# Patient Record
Sex: Male | Born: 1955 | Race: Black or African American | Hispanic: No | Marital: Single | Smoking: Never smoker
Health system: Southern US, Community
[De-identification: ages and names within clinical notes are randomized; demographics above are authoritative.]

## PROBLEM LIST (undated history)

## (undated) DIAGNOSIS — I10 Essential (primary) hypertension: Secondary | ICD-10-CM

## (undated) DIAGNOSIS — I272 Pulmonary hypertension, unspecified: Secondary | ICD-10-CM

## (undated) DIAGNOSIS — J40 Bronchitis, not specified as acute or chronic: Secondary | ICD-10-CM

## (undated) DIAGNOSIS — Z95811 Presence of heart assist device: Secondary | ICD-10-CM

## (undated) DIAGNOSIS — Z95810 Presence of automatic (implantable) cardiac defibrillator: Secondary | ICD-10-CM

## (undated) DIAGNOSIS — Z860101 Personal history of adenomatous and serrated colon polyps: Secondary | ICD-10-CM

## (undated) DIAGNOSIS — I509 Heart failure, unspecified: Secondary | ICD-10-CM

## (undated) DIAGNOSIS — J189 Pneumonia, unspecified organism: Secondary | ICD-10-CM

## (undated) HISTORY — DX: Pulmonary hypertension, unspecified: I27.20

## (undated) HISTORY — DX: Personal history of adenomatous and serrated colon polyps: Z86.0101

---

## 1998-04-05 DIAGNOSIS — I10 Essential (primary) hypertension: Secondary | ICD-10-CM

## 1998-04-05 HISTORY — DX: Essential (primary) hypertension: I10

## 1998-07-06 ENCOUNTER — Emergency Department (HOSPITAL_COMMUNITY): Admission: EM | Admit: 1998-07-06 | Discharge: 1998-07-06 | Payer: Self-pay | Admitting: Emergency Medicine

## 2005-03-16 ENCOUNTER — Emergency Department (HOSPITAL_COMMUNITY): Admission: EM | Admit: 2005-03-16 | Discharge: 2005-03-16 | Payer: Self-pay

## 2006-02-22 ENCOUNTER — Emergency Department (HOSPITAL_COMMUNITY): Admission: EM | Admit: 2006-02-22 | Discharge: 2006-02-22 | Payer: Self-pay | Admitting: Emergency Medicine

## 2008-03-10 ENCOUNTER — Emergency Department (HOSPITAL_COMMUNITY): Admission: EM | Admit: 2008-03-10 | Discharge: 2008-03-10 | Payer: Self-pay | Admitting: Emergency Medicine

## 2009-04-05 DIAGNOSIS — I509 Heart failure, unspecified: Secondary | ICD-10-CM

## 2009-04-05 DIAGNOSIS — Z95810 Presence of automatic (implantable) cardiac defibrillator: Secondary | ICD-10-CM

## 2009-04-05 HISTORY — DX: Heart failure, unspecified: I50.9

## 2009-04-05 HISTORY — DX: Presence of automatic (implantable) cardiac defibrillator: Z95.810

## 2009-07-14 ENCOUNTER — Ambulatory Visit (HOSPITAL_COMMUNITY): Admission: RE | Admit: 2009-07-14 | Discharge: 2009-07-14 | Payer: Self-pay | Admitting: Family Medicine

## 2009-07-16 ENCOUNTER — Encounter (INDEPENDENT_AMBULATORY_CARE_PROVIDER_SITE_OTHER): Payer: Self-pay | Admitting: Emergency Medicine

## 2009-07-16 ENCOUNTER — Ambulatory Visit: Payer: Self-pay | Admitting: Surgery

## 2009-07-16 ENCOUNTER — Inpatient Hospital Stay (HOSPITAL_COMMUNITY): Admission: EM | Admit: 2009-07-16 | Discharge: 2009-07-22 | Payer: Self-pay | Admitting: Emergency Medicine

## 2009-07-16 ENCOUNTER — Ambulatory Visit: Payer: Self-pay | Admitting: Internal Medicine

## 2009-07-18 ENCOUNTER — Encounter (INDEPENDENT_AMBULATORY_CARE_PROVIDER_SITE_OTHER): Payer: Self-pay | Admitting: Internal Medicine

## 2009-07-29 ENCOUNTER — Encounter (INDEPENDENT_AMBULATORY_CARE_PROVIDER_SITE_OTHER): Payer: Self-pay | Admitting: *Deleted

## 2009-08-07 ENCOUNTER — Ambulatory Visit: Payer: Self-pay | Admitting: Physician Assistant

## 2009-08-07 DIAGNOSIS — I472 Ventricular tachycardia, unspecified: Secondary | ICD-10-CM | POA: Insufficient documentation

## 2009-08-07 DIAGNOSIS — I10 Essential (primary) hypertension: Secondary | ICD-10-CM

## 2009-08-07 DIAGNOSIS — I5022 Chronic systolic (congestive) heart failure: Secondary | ICD-10-CM | POA: Insufficient documentation

## 2009-08-07 DIAGNOSIS — Z95810 Presence of automatic (implantable) cardiac defibrillator: Secondary | ICD-10-CM | POA: Insufficient documentation

## 2009-08-07 DIAGNOSIS — I428 Other cardiomyopathies: Secondary | ICD-10-CM

## 2009-08-07 DIAGNOSIS — K649 Unspecified hemorrhoids: Secondary | ICD-10-CM | POA: Insufficient documentation

## 2009-08-07 LAB — CONVERTED CEMR LAB: Rapid HIV Screen: NEGATIVE

## 2009-08-08 ENCOUNTER — Encounter: Payer: Self-pay | Admitting: Physician Assistant

## 2009-08-08 LAB — CONVERTED CEMR LAB
BUN: 15 mg/dL (ref 6–23)
CO2: 25 meq/L (ref 19–32)
Calcium: 9.3 mg/dL (ref 8.4–10.5)
Glucose, Bld: 95 mg/dL (ref 70–99)

## 2009-08-22 ENCOUNTER — Telehealth: Payer: Self-pay | Admitting: Physician Assistant

## 2009-09-15 ENCOUNTER — Telehealth: Payer: Self-pay | Admitting: Physician Assistant

## 2009-11-05 ENCOUNTER — Encounter: Payer: Self-pay | Admitting: Physician Assistant

## 2009-12-05 ENCOUNTER — Encounter: Payer: Self-pay | Admitting: Physician Assistant

## 2010-05-05 NOTE — Letter (Signed)
Summary: Handout Printed  Printed Handout:  - Sitz Bath 

## 2010-05-05 NOTE — Assessment & Plan Note (Signed)
Summary: XFU-- LOW BP, FACIAL FRACTURE//YC   Vital Signs:  Patient profile:   55 year old male Height:      70 inches Weight:      224 pounds BMI:     32.26 Temp:     97.4 degrees F oral Pulse rate:   78 / minute Pulse rhythm:   regular Resp:     18 per minute BP sitting:   107 / 78  (left arm) Cuff size:   regular  Vitals Entered By: Armenia Shannon (Aug 07, 2009 10:08 AM)  Primary Care Provider:  Tereso Newcomer, PA-C   History of Present Illness: New patient.  No prior PCP.    Just d/c from [place].  Says he was fine until he got sick in Feb.  Developed pnuemonia and was started on treatment.  He was admitted to [facility] with syncope on 07/16/2009.  He was noted to have suffered a left facial fracture secondary to his fall.  EF was 20-25% by echo.  He had a cath and was noted to have normal cors.  He was diagnosed with NICM.  He was noted to have NSVT on the monitors.  He had an AICD placed due to concerns over ventricular arrhythmias as a cause for his syncope.  He was in acute systolic heart failure.  He was diuresed and also treated for pneumonia.  ENT evaluated him and according to the d/c notes, he could f/u with ENT as needed.  He has seen the cardiologist since discharge.  He has another appointment in several weeks.   Denies any syncope or AICD therapies.  Denies dyspnea.  Describes NYHA Class 2 symptoms.  No orthopnea or PND.  No pedal edema.  No chest pain.  Hemorrhoids:  Has had a problem with his hemorrhoids since about March.  He has a lot of pain.  He has seen some blood on the stool.  No blood mixed in.  No melena.  He saw Dr. Lovell Sheehan in [place] and he did plan on getting doing hemorrhoid surgery tomorrow.  However, with his recent AICD, the surgery was canceled.  He has been taking stool softeners.  His stools are still somewhat hard.  Habits & Providers  Alcohol-Tobacco-Diet     Tobacco Status: never  Exercise-Depression-Behavior     Drug Use: no  Current Medications  (verified): 1)  Hydrocodone-Acetaminophen 5-500 Mg Tabs (Hydrocodone-Acetaminophen) .... Take One Tab By Mouth Twice Daily 2)  Lanoxin 0.125 Mg Tabs (Digoxin) .... Take One Tab Each Day 3)  Lisinopril 5 Mg Tabs (Lisinopril) .... One Tab Daliy 4)  Furosemide 40 Mg Tabs (Furosemide) .... One-Half Tab Every Day 5)  Metoprolol Tartrate 50 Mg Tabs (Metoprolol Tartrate) .... Take One Tab Every 12 Hours  Allergies (verified): No Known Drug Allergies  Past History:  Past Medical History: NICM EF 20% Nonsustained VTach with h/o syncope Chronic Systolic CHF Echo 07/18/2009:  EF 20-25%, diffuse HK, restrictive physiology c/w decreased LV diastolic compliance or increased LA pressure, mod. MR, trivial AI Carotids 07/16/2009:  no ICA stenosis Hypertension Hemorrhoids  Past Surgical History: Cardiac Cath 07/22/2009: normal coronaries s/p dual chamber ICD 07/22/2009  Family History: HTN - ?  Social History: Occupation: delivering pharmaceuticals Single 2 kids Never Smoked Alcohol use-no Drug use-no Occupation:  employed Smoking Status:  never Drug Use:  no  Physical Exam  General:  alert, well-developed, and well-nourished.   Head:  normocephalic and atraumatic.   Neck:  no jvd  Lungs:  normal  breath sounds, no crackles, and no wheezes.   Heart:  normal rate, regular rhythm, and no gallop.   Abdomen:  soft, non-tender, and no hepatomegaly.   Rectal:  several small hemorrhoids non-thrombosed  Extremities:  no edema  Neurologic:  alert & oriented X3 and cranial nerves II-XII intact.   Psych:  normally interactive.     Impression & Recommendations:  Problem # 1:  HEMORRHOIDS (ICD-455.6) cannot have surgery now due to recent AICD hemorrhoids are small advised him to avoid hydrocodone for pain mgmt . . . Marland Kitchentylenol or ibuprofen as needed refill anusol advised him to use sitz baths high fiber diet handout given Rx for miralax  Problem # 2:  CHRONIC SYSTOLIC HEART FAILURE  (ICD-428.22) optivolemic f/u with [facility].  His updated medication list for this problem includes:    Lanoxin 0.125 Mg Tabs (Digoxin) .Marland Kitchen... Take one tab each day    Lisinopril 5 Mg Tabs (Lisinopril) ..... One tab daliy    Furosemide 40 Mg Tabs (Furosemide) ..... One-half tab every day    Metoprolol Tartrate 50 Mg Tabs (Metoprolol tartrate) .Marland Kitchen... Take one tab every 12 hours  Problem # 3:  UNSPECIFIED SECONDARY CARDIOMYOPATHY (ICD-425.9)  not sure why he is on metoprolol instead of carvedilol defer to cardiology  Orders: T-HIV Antibody  (Reflex) ([id number])  Problem # 4:  ESSENTIAL HYPERTENSION, BENIGN (ICD-401.1)  controlled  His updated medication list for this problem includes:    Lisinopril 5 Mg Tabs (Lisinopril) ..... One tab daliy    Furosemide 40 Mg Tabs (Furosemide) ..... One-half tab every day    Metoprolol Tartrate 50 Mg Tabs (Metoprolol tartrate) .Marland Kitchen... Take one tab every 12 hours  Orders: T-Basic Metabolic Panel [phone])  Problem # 5:  Preventive Health Care (ICD-V70.0) eventually schedule CPE will have to hold off on colo until ok with cardiology  Complete Medication List: 1)  Hydrocodone-acetaminophen 5-500 Mg Tabs (Hydrocodone-acetaminophen) .... Take one tab by mouth twice daily 2)  Lanoxin 0.125 Mg Tabs (Digoxin) .... Take one tab each day 3)  Lisinopril 5 Mg Tabs (Lisinopril) .... One tab daliy 4)  Furosemide 40 Mg Tabs (Furosemide) .... One-half tab every day 5)  Metoprolol Tartrate 50 Mg Tabs (Metoprolol tartrate) .... Take one tab every 12 hours 6)  Anusol-hc 25 Mg Supp (Hydrocortisone acetate) .... Apply two times a day as needed for hemorrhoidal pain 7)  Miralax Powd (Polyethylene glycol 3350) .... Dissolve one capful in 8 oz glass of water and drink once daily  Patient Instructions: 1)  Do Sitz baths once or twice a day as needed. 2)  Increase Fiber in diet. 3)  Try Miralax every other day at first.  You can use once daily if  needed. 4)  Stop the stool softeners. 5)  Try tylenol or ibuprofen first for pain from hemorrhoids. 6)  Take 650 - 1000 mg of tylenol every 4-6 hours as needed for relief of pain or comfort of fever. Avoid taking more than 4000 mg in a 24 hour period( can cause liver damage in higher doses).  7)  Take 400-600 mg of Ibuprofen (Advil, Motrin) with food every 4-6 hours as needed  for relief of pain or comfort of fever.  8)  Do not take medicines like ibuprofen on a daily basis. 9)  Please schedule a follow-up appointment in 1 month with Fleeta Kunde for blood pressure and hemorrhoids.  Prescriptions: MIRALAX  POWD (POLYETHYLENE GLYCOL 3350) dissolve one capful in 8 oz glass of water and drink once  daily  #1 bottle x 5   Entered and Authorized by:   Tereso Newcomer PA-C   Signed by:   Tereso Newcomer PA-C on 08/07/2009   Method used:   Print then Give to Patient   RxID:   [id number] ANUSOL-HC 25 MG SUPP (HYDROCORTISONE ACETATE) apply two times a day as needed for hemorrhoidal pain  #20 x 3   Entered and Authorized by:   Tereso Newcomer PA-C   Signed by:   Tereso Newcomer PA-C on 08/07/2009   Method used:   Print then Give to Patient   RxID:   [id number]   Laboratory Results  Date/Time Received: Aug 07, 2009 11:29 AM   Other Tests  Rapid HIV: negative

## 2010-05-05 NOTE — Progress Notes (Signed)
Summary: Refills  Phone Note Call from Patient Call back at [facility] Phone [phone] Call back at [phone]   Summary of Call: This pt had a first hospital follow up on Aug 07, 2009 and he had in april two prescription that was prescribed from a physician at the ER and he would like to request his current provider prescribe  those medications to be refills (lanoxin, and metroprolol).  [facility] [phone]) Meira Wahba PA-c Initial call taken by: Manon Hilding,  Aug 22, 2009 11:53 AM  Follow-up for Phone Call        forward to provider Follow-up by: Arm[place] Shannon,  Aug 22, 2009 12:21 PM  Additional Follow-up for Phone Call Additional follow up Details #1::        That is ok. But, these meds are managed by the cardiologist. Make sure he is following up with them. Please request records be sent from their office (he goes to [facility]).  Additional Follow-up by: Tereso Newcomer PA-C,  Aug 22, 2009 1:57 PM    Additional Follow-up for Phone Call Additional follow up Details #2::    Left message on answering machine for pt to call back.Marland KitchenMarland KitchenMarland KitchenArmenia Shannon  Aug 27, 2009 11:16 AM   PT IS   via Aggie Cosier pt is awre Follow-up by: Armenia Shannon,  Aug 29, 2009 3:37 PM  Prescriptions: METOPROLOL TARTRATE 50 MG TABS (METOPROLOL TARTRATE) take one tab every 12 hours  #60 x 3   Entered and Authorized by:   Tereso Newcomer PA-C   Signed by:   Tereso Newcomer PA-C on 08/22/2009   Method used:   Electronically to        [facility] (retail)       [address]       [place], [place]  [address]       Ph: [phone]       Fax: [phone]   RxID:   [id number] LANOXIN 0.125 MG TABS (DIGOXIN) take one tab each day  #30 x 3   Entered and Authorized by:   Tereso Newcomer PA-C   Signed by:   Tereso Newcomer PA-C on 08/22/2009   Method used:   Electronically to        [facility] (retail)       [address]  [place], [place]  [address]       Ph: [phone]       Fax: [phone]   RxID:   [id number]

## 2010-05-05 NOTE — Miscellaneous (Signed)
  Clinical Lists Changes  Medications: Changed medication from METOPROLOL TARTRATE 50 MG TABS (METOPROLOL TARTRATE) take one tab every 12 hours to METOPROLOL TARTRATE 50 MG TABS (METOPROLOL TARTRATE) 1/2 tab in AM and 1 by mouth in PM  Appended Document:     Clinical Lists Changes  Observations: Added new observation of PAST MED HX: NICM EF 20% Nonsustained VTach with h/o syncope Chronic Systolic CHF   a.  CARD:  DR. Croitoru at [facility] Echo 07/18/2009:  EF 20-25%, diffuse HK, restrictive physiology c/w decreased LV diastolic compliance or increased LA pressure, mod. MR, trivial AI Carotids 07/16/2009:  no ICA stenosis Hypertension Hemorrhoids  (12/05/2009 16:20)        Past History:  Past Medical History: NICM EF 20% Nonsustained VTach with h/o syncope Chronic Systolic CHF   a.  CARD:  DR. Croitoru at [facility] Echo 07/18/2009:  EF 20-25%, diffuse HK, restrictive physiology c/w decreased LV diastolic compliance or increased LA pressure, mod. MR, trivial AI Carotids 07/16/2009:  no ICA stenosis Hypertension Hemorrhoids

## 2010-05-05 NOTE — Letter (Signed)
Summary: [facility]   Imported By: Arta Bruce 12/11/2009 12:51:58  _____________________________________________________________________  External Attachment:    Type:   Image     Comment:   External Document

## 2010-05-05 NOTE — Letter (Signed)
Summary: New Patient letter  [facility]  [address] [place], [place] [address]   Phone: [phone]  Fax: [phone]       07/29/2009 MRN: [id number]  Robert [name] [address] [place], [place]  [address]  Dear Robert [name],  Welcome to the Gastroenterology Division at [facility].    You are scheduled to see Dr. Russella Dar on 09/08/2009 at 3:30PM on the 3rd floor at [facility], [address]  We ask that you try to arrive at our office 15 minutes prior to your appointment time to allow for check-in.  We would like you to complete the enclosed self-administered evaluation form prior to your visit and bring it with you on the day of your appointment.  We will review it with you.  Also, please bring a complete list of all your medications or, if you prefer, bring the medication bottles and we will list them.  Please bring your insurance card so that we may make a copy of it.  If your insurance requires a referral to see a specialist, please bring your referral form from your primary care physician.  Co-payments are due at the time of your visit and may be paid by cash, check or credit card.     Your office visit will consist of a consult with your physician (includes a physical exam), any laboratory testing he/she may order, scheduling of any necessary diagnostic testing (e.g. x-ray, ultrasound, CT-scan), and scheduling of a procedure (e.g. Endoscopy, Colonoscopy) if required.  Please allow enough time on your schedule to allow for any/all of these possibilities.    If you cannot keep your appointment, please call [phone] to cancel or reschedule prior to your appointment date.  This allows [place] the opportunity to schedule an appointment for another patient in need of care.  If you do not cancel or reschedule by 5 p.m. the business day prior to your appointment date, you will be charged a $50.00 late cancellation/no-show fee.    Thank you for choosing [facility] for your medical needs.  We appreciate the opportunity to care for you.  Please visit [place] at our website  to learn more about our practice.                     Sincerely,                                                             The Gastroenterology Division

## 2010-05-05 NOTE — Letter (Signed)
Summary: Handout Printed  Printed Handout:  - Diet - High-Fiber 

## 2010-05-05 NOTE — Progress Notes (Signed)
Summary: Request medication  Phone Note Call from Patient Call back at Home Phone [phone] Call back at [phone]   Summary of Call: Since Friday evening, this pt starting have chest congestion, running nose, coughing and probably sinus infection.   Pt was taking some kind of allergy medication that cannot recalled the name but seem that is not helping him.  Pt wants to make sure which medication should she get out in the counter.  [facility] [address] [phone]) Alben Spittle PA-c Initial call taken by: Manon Hilding,  September 15, 2009 2:43 PM  Follow-up for Phone Call        Left message on answer machine for pt. to return call Gaylyn Cheers RN  September 15, 2009 2:49 PM   Additional Follow-up for Phone Call Additional follow up Details #1::        Pt. stopped by office, having cl nasal drainage and post nasal drip. Denies fever. Advised to take OTC Zyrtec and may use saline nasal spray. Keep windows closed, avoid being outdoors for long periods of time, shower and change clothes after being outdooors. To contact our office if develops fever, drainage becomes green, feels worse or no improvement. Pt. verbalized understanding. Additional Follow-up by: Gaylyn Cheers RN,  September 15, 2009 4:02 PM

## 2010-05-05 NOTE — Letter (Signed)
Summary: pt information sheet  pt information sheet   Imported By: Arta Bruce 10/02/2009 15:11:22  _____________________________________________________________________  External Attachment:    Type:   Image     Comment:   External Document

## 2010-05-05 NOTE — Letter (Signed)
Summary: *HSN Results Follow up  [facility]  [address] [place], [place] [address]   Phone: [phone]  Fax: [phone]      08/08/2009   JENKINS [name] [address] [place], [place]  [address]   Dear  [name],                            ____S.Drinkard,FNP   ____D. Gore,FNP       ____B. McPherson,MD   ____V. Rankins,MD    ____E. Mulberry,MD    ____N. Daphine Deutscher, FNP  ____D. Reche Dixon, MD    ____K. Philipp Deputy, MD    __x__S. Alben Spittle, PA-C     This letter is to inform you that your recent test(s):  _______Pap Smear    ____x___Lab Test     _______X-ray    ___x____ is within acceptable limits  _______ requires a medication change  _______ requires a follow-up lab visit  _______ requires a follow-up visit with your provider   Comments:       _________________________________________________________ If you have any questions, please contact our office                     Sincerely,  Tereso Newcomer PA-C [facility]

## 2010-06-23 LAB — CBC
Hemoglobin: 13.4 g/dL (ref 13.0–17.0)
Hemoglobin: 14.2 g/dL (ref 13.0–17.0)
Hemoglobin: 14.5 g/dL (ref 13.0–17.0)
MCHC: 34.8 g/dL (ref 30.0–36.0)
MCHC: 35 g/dL (ref 30.0–36.0)
MCV: 89.5 fL (ref 78.0–100.0)
MCV: 89.7 fL (ref 78.0–100.0)
MCV: 90.2 fL (ref 78.0–100.0)
Platelets: 137 10*3/uL — ABNORMAL LOW (ref 150–400)
RBC: 4.54 MIL/uL (ref 4.22–5.81)
RBC: 4.65 MIL/uL (ref 4.22–5.81)
RBC: 4.68 MIL/uL (ref 4.22–5.81)
RDW: 13.9 % (ref 11.5–15.5)
RDW: 14.1 % (ref 11.5–15.5)
RDW: 14.3 % (ref 11.5–15.5)
WBC: 7.8 10*3/uL (ref 4.0–10.5)
WBC: 9 10*3/uL (ref 4.0–10.5)
WBC: 9.3 10*3/uL (ref 4.0–10.5)

## 2010-06-23 LAB — BASIC METABOLIC PANEL
BUN: 13 mg/dL (ref 6–23)
BUN: 16 mg/dL (ref 6–23)
CO2: 24 mEq/L (ref 19–32)
CO2: 28 mEq/L (ref 19–32)
CO2: 29 mEq/L (ref 19–32)
Calcium: 8.4 mg/dL (ref 8.4–10.5)
Calcium: 8.6 mg/dL (ref 8.4–10.5)
Chloride: 103 mEq/L (ref 96–112)
Chloride: 108 mEq/L (ref 96–112)
Creatinine, Ser: 1.29 mg/dL (ref 0.4–1.5)
Creatinine, Ser: 1.51 mg/dL — ABNORMAL HIGH (ref 0.4–1.5)
GFR calc Af Amer: 59 mL/min — ABNORMAL LOW (ref >60)
GFR calc Af Amer: >60 mL/min (ref >60)
GFR calc non Af Amer: 49 mL/min — ABNORMAL LOW (ref >60)
GFR calc non Af Amer: 58 mL/min — ABNORMAL LOW (ref >60)
Glucose, Bld: 106 mg/dL — ABNORMAL HIGH (ref 70–99)
Glucose, Bld: 90 mg/dL (ref 70–99)
Glucose, Bld: 96 mg/dL (ref 70–99)
Potassium: 3.5 mEq/L (ref 3.5–5.1)
Sodium: 138 mEq/L (ref 135–145)
Sodium: 140 mEq/L (ref 135–145)

## 2010-06-23 LAB — LIPID PANEL
Triglycerides: 110 mg/dL (ref <150)
VLDL: 22 mg/dL (ref 0–40)

## 2010-06-23 LAB — POCT I-STAT 3, VENOUS BLOOD GAS (G3P V)
Bicarbonate: 26 mEq/L — ABNORMAL HIGH (ref 20.0–24.0)
pH, Ven: 7.406 — ABNORMAL HIGH (ref 7.250–7.300)
pO2, Ven: 37 mmHg (ref 30.0–45.0)

## 2010-06-23 LAB — BRAIN NATRIURETIC PEPTIDE
Pro B Natriuretic peptide (BNP): 275 pg/mL — ABNORMAL HIGH (ref 0.0–100.0)
Pro B Natriuretic peptide (BNP): 285 pg/mL — ABNORMAL HIGH (ref 0.0–100.0)
Pro B Natriuretic peptide (BNP): 525 pg/mL — ABNORMAL HIGH (ref 0.0–100.0)

## 2010-06-23 LAB — DIFFERENTIAL
Basophils Absolute: 0 10*3/uL (ref 0.0–0.1)
Basophils Relative: 0 % (ref 0–1)
Basophils Relative: 0 % (ref 0–1)
Eosinophils Absolute: 0 10*3/uL (ref 0.0–0.7)
Eosinophils Absolute: 0.1 10*3/uL (ref 0.0–0.7)
Eosinophils Relative: 1 % (ref 0–5)
Lymphs Abs: 1.6 10*3/uL (ref 0.7–4.0)
Monocytes Absolute: 0.7 10*3/uL (ref 0.1–1.0)
Monocytes Relative: 9 % (ref 3–12)
Neutro Abs: 6.9 10*3/uL (ref 1.7–7.7)

## 2010-06-23 LAB — MAGNESIUM
Magnesium: 2.3 mg/dL (ref 1.5–2.5)
Magnesium: 2.5 mg/dL (ref 1.5–2.5)

## 2010-06-23 LAB — POCT I-STAT 3, ART BLOOD GAS (G3+)
O2 Saturation: 93 %
TCO2: 25 mmol/L (ref 0–100)
pCO2 arterial: 36.8 mmHg (ref 35.0–45.0)
pO2, Arterial: 65 mmHg — ABNORMAL LOW (ref 80.0–100.0)

## 2010-06-24 LAB — BASIC METABOLIC PANEL
CO2: 25 mEq/L (ref 19–32)
Chloride: 104 mEq/L (ref 96–112)
Chloride: 105 mEq/L (ref 96–112)
GFR calc Af Amer: >60 mL/min (ref >60)
GFR calc non Af Amer: 60 mL/min — ABNORMAL LOW (ref >60)
GFR calc non Af Amer: >60 mL/min (ref >60)
Glucose, Bld: 99 mg/dL (ref 70–99)
Potassium: 3.7 mEq/L (ref 3.5–5.1)
Potassium: 3.7 mEq/L (ref 3.5–5.1)
Sodium: 135 mEq/L (ref 135–145)
Sodium: 137 mEq/L (ref 135–145)

## 2010-06-24 LAB — DIFFERENTIAL
Eosinophils Absolute: 0 10*3/uL (ref 0.0–0.7)
Eosinophils Relative: 0 % (ref 0–5)
Lymphocytes Relative: 5 % — ABNORMAL LOW (ref 12–46)
Lymphs Abs: 0.7 10*3/uL (ref 0.7–4.0)
Monocytes Relative: 4 % (ref 3–12)
Neutrophils Relative %: 91 % — ABNORMAL HIGH (ref 43–77)

## 2010-06-24 LAB — POCT I-STAT, CHEM 8
Chloride: 106 mEq/L (ref 96–112)
HCT: 43 % (ref 39.0–52.0)
Hemoglobin: 14.6 g/dL (ref 13.0–17.0)
Potassium: 3.6 mEq/L (ref 3.5–5.1)
Sodium: 137 mEq/L (ref 135–145)

## 2010-06-24 LAB — CARDIAC PANEL(CRET KIN+CKTOT+MB+TROPI)
CK, MB: 3 ng/mL (ref 0.3–4.0)
Relative Index: 2 (ref 0.0–2.5)
Relative Index: 2.1 (ref 0.0–2.5)
Total CK: 148 U/L (ref 7–232)
Troponin I: 0.04 ng/mL (ref 0.00–0.06)

## 2010-06-24 LAB — PROTIME-INR
INR: 1.09 (ref 0.00–1.49)
Prothrombin Time: 14 seconds (ref 11.6–15.2)

## 2010-06-24 LAB — URINALYSIS, ROUTINE W REFLEX MICROSCOPIC
Ketones, ur: NEGATIVE mg/dL
Nitrite: NEGATIVE
Protein, ur: NEGATIVE mg/dL
Urobilinogen, UA: 0.2 mg/dL (ref 0.0–1.0)

## 2010-06-24 LAB — CK TOTAL AND CKMB (NOT AT ARMC)
CK, MB: 3.5 ng/mL (ref 0.3–4.0)
Relative Index: 2.3 (ref 0.0–2.5)

## 2010-06-24 LAB — URINE CULTURE: Culture: NO GROWTH

## 2010-06-24 LAB — RAPID URINE DRUG SCREEN, HOSP PERFORMED
Amphetamines: NOT DETECTED
Barbiturates: NOT DETECTED
Opiates: POSITIVE — AB

## 2010-06-24 LAB — CBC
HCT: 40.6 % (ref 39.0–52.0)
HCT: 41 % (ref 39.0–52.0)
Hemoglobin: 14.2 g/dL (ref 13.0–17.0)
MCV: 89.6 fL (ref 78.0–100.0)
MCV: 89.8 fL (ref 78.0–100.0)
Platelets: 125 10*3/uL — ABNORMAL LOW (ref 150–400)
RBC: 4.52 MIL/uL (ref 4.22–5.81)
RDW: 14.2 % (ref 11.5–15.5)
WBC: 12.5 10*3/uL — ABNORMAL HIGH (ref 4.0–10.5)

## 2010-06-24 LAB — POCT CARDIAC MARKERS
Myoglobin, poc: 107 ng/mL (ref 12–200)
Troponin i, poc: <0.05 ng/mL (ref 0.00–0.09)

## 2010-06-24 LAB — APTT: aPTT: 25 seconds (ref 24–37)

## 2010-06-24 LAB — BRAIN NATRIURETIC PEPTIDE: Pro B Natriuretic peptide (BNP): 736 pg/mL — ABNORMAL HIGH (ref 0.0–100.0)

## 2011-06-03 HISTORY — PX: CARDIAC DEFIBRILLATOR PLACEMENT: SHX171

## 2011-08-04 ENCOUNTER — Encounter (HOSPITAL_COMMUNITY): Payer: Self-pay | Admitting: Emergency Medicine

## 2011-08-04 ENCOUNTER — Inpatient Hospital Stay (HOSPITAL_COMMUNITY)
Admission: EM | Admit: 2011-08-04 | Discharge: 2011-08-06 | DRG: 069 | Disposition: A | Discharge status: Home / Self Care | Payer: PRIVATE HEALTH INSURANCE | Attending: Family Medicine | Admitting: Family Medicine

## 2011-08-04 DIAGNOSIS — G458 Other transient cerebral ischemic attacks and related syndromes: Principal | ICD-10-CM | POA: Diagnosis present

## 2011-08-04 DIAGNOSIS — I509 Heart failure, unspecified: Secondary | ICD-10-CM

## 2011-08-04 DIAGNOSIS — Z7982 Long term (current) use of aspirin: Secondary | ICD-10-CM

## 2011-08-04 DIAGNOSIS — I4729 Other ventricular tachycardia: Secondary | ICD-10-CM | POA: Diagnosis present

## 2011-08-04 DIAGNOSIS — K649 Unspecified hemorrhoids: Secondary | ICD-10-CM

## 2011-08-04 DIAGNOSIS — I428 Other cardiomyopathies: Secondary | ICD-10-CM

## 2011-08-04 DIAGNOSIS — I5023 Acute on chronic systolic (congestive) heart failure: Secondary | ICD-10-CM | POA: Diagnosis present

## 2011-08-04 DIAGNOSIS — G459 Transient cerebral ischemic attack, unspecified: Secondary | ICD-10-CM

## 2011-08-04 DIAGNOSIS — I5022 Chronic systolic (congestive) heart failure: Secondary | ICD-10-CM

## 2011-08-04 DIAGNOSIS — J40 Bronchitis, not specified as acute or chronic: Secondary | ICD-10-CM | POA: Insufficient documentation

## 2011-08-04 DIAGNOSIS — I472 Ventricular tachycardia, unspecified: Secondary | ICD-10-CM

## 2011-08-04 DIAGNOSIS — J189 Pneumonia, unspecified organism: Secondary | ICD-10-CM

## 2011-08-04 DIAGNOSIS — Z79899 Other long term (current) drug therapy: Secondary | ICD-10-CM

## 2011-08-04 DIAGNOSIS — I10 Essential (primary) hypertension: Secondary | ICD-10-CM

## 2011-08-04 DIAGNOSIS — Z95810 Presence of automatic (implantable) cardiac defibrillator: Secondary | ICD-10-CM

## 2011-08-04 HISTORY — DX: Presence of automatic (implantable) cardiac defibrillator: Z95.810

## 2011-08-04 HISTORY — DX: Bronchitis, not specified as acute or chronic: J40

## 2011-08-04 HISTORY — DX: Pneumonia, unspecified organism: J18.9

## 2011-08-04 HISTORY — DX: Heart failure, unspecified: I50.9

## 2011-08-04 HISTORY — DX: Essential (primary) hypertension: I10

## 2011-08-04 NOTE — ED Notes (Signed)
Patient reports that his defibrillator fired once yesterday -- patient states that right before it fired, he felt palpitations in his heart.  Patient denies chest pain and shortness of breath at this time.  Patient checked his blood pressure today; reading 105/82 with HR 92.  Patient states that he rechecked his vitals right before bed tonight and had a blood pressure of 128/ with a HR of 52.  Patient was concerned with the change in heart rate and blood pressure, so he came to the ED to get checked out.  Patient denies defibrillator firing today.

## 2011-08-05 ENCOUNTER — Emergency Department (HOSPITAL_COMMUNITY): Payer: PRIVATE HEALTH INSURANCE

## 2011-08-05 ENCOUNTER — Encounter (HOSPITAL_COMMUNITY): Payer: Self-pay | Admitting: Internal Medicine

## 2011-08-05 DIAGNOSIS — G459 Transient cerebral ischemic attack, unspecified: Secondary | ICD-10-CM

## 2011-08-05 DIAGNOSIS — I428 Other cardiomyopathies: Secondary | ICD-10-CM

## 2011-08-05 DIAGNOSIS — I472 Ventricular tachycardia: Secondary | ICD-10-CM

## 2011-08-05 LAB — CBC
Hemoglobin: 15.2 g/dL (ref 13.0–17.0)
MCH: 29.5 pg (ref 26.0–34.0)
MCV: 87.9 fL (ref 78.0–100.0)
Platelets: 145 10*3/uL — ABNORMAL LOW (ref 150–400)
Platelets: 161 10*3/uL (ref 150–400)
RBC: 4.99 MIL/uL (ref 4.22–5.81)
RBC: 5.2 MIL/uL (ref 4.22–5.81)
WBC: 10.3 10*3/uL (ref 4.0–10.5)
WBC: 7.5 10*3/uL (ref 4.0–10.5)

## 2011-08-05 LAB — COMPREHENSIVE METABOLIC PANEL
ALT: 55 U/L — ABNORMAL HIGH (ref 0–53)
Alkaline Phosphatase: 63 U/L (ref 39–117)
CO2: 24 mEq/L (ref 19–32)
GFR calc Af Amer: 54 mL/min — ABNORMAL LOW (ref >90)
GFR calc non Af Amer: 47 mL/min — ABNORMAL LOW (ref >90)
Glucose, Bld: 103 mg/dL — ABNORMAL HIGH (ref 70–99)
Potassium: 4.1 mEq/L (ref 3.5–5.1)
Sodium: 136 mEq/L (ref 135–145)
Total Bilirubin: 0.9 mg/dL (ref 0.3–1.2)

## 2011-08-05 LAB — DIFFERENTIAL
Eosinophils Relative: 1 % (ref 0–5)
Lymphocytes Relative: 26 % (ref 12–46)
Lymphs Abs: 2.6 10*3/uL (ref 0.7–4.0)
Neutrophils Relative %: 66 % (ref 43–77)

## 2011-08-05 LAB — PRO B NATRIURETIC PEPTIDE: Pro B Natriuretic peptide (BNP): 2992 pg/mL — ABNORMAL HIGH (ref 0–125)

## 2011-08-05 LAB — CREATININE, SERUM: Creatinine, Ser: 1.45 mg/dL — ABNORMAL HIGH (ref 0.50–1.35)

## 2011-08-05 LAB — PROTIME-INR: Prothrombin Time: 14.8 seconds (ref 11.6–15.2)

## 2011-08-05 MED ORDER — LISINOPRIL 10 MG PO TABS
10.0000 mg | ORAL_TABLET | Freq: Every day | ORAL | Status: DC
  Administered 2011-08-05 – 2011-08-06 (×2): 10 mg via ORAL
  Filled 2011-08-05 (×2): qty 1

## 2011-08-05 MED ORDER — METOPROLOL TARTRATE 25 MG PO TABS
25.0000 mg | ORAL_TABLET | Freq: Two times a day (BID) | ORAL | Status: DC
  Filled 2011-08-05 (×2): qty 1

## 2011-08-05 MED ORDER — ASPIRIN 325 MG PO TABS
325.0000 mg | ORAL_TABLET | Freq: Every day | ORAL | Status: DC
  Administered 2011-08-05 – 2011-08-06 (×2): 325 mg via ORAL
  Filled 2011-08-05 (×2): qty 1

## 2011-08-05 MED ORDER — SODIUM CHLORIDE 0.9 % IV SOLN: INTRAVENOUS | Status: AC

## 2011-08-05 MED ORDER — FUROSEMIDE 40 MG PO TABS
40.0000 mg | ORAL_TABLET | Freq: Every day | ORAL | Status: DC
  Administered 2011-08-05 – 2011-08-06 (×2): 40 mg via ORAL
  Filled 2011-08-05 (×2): qty 1

## 2011-08-05 MED ORDER — POTASSIUM CHLORIDE CRYS ER 20 MEQ PO TBCR
20.0000 meq | EXTENDED_RELEASE_TABLET | Freq: Every day | ORAL | Status: DC
  Administered 2011-08-05 – 2011-08-06 (×2): 20 meq via ORAL
  Filled 2011-08-05 (×2): qty 1

## 2011-08-05 MED ORDER — ENOXAPARIN SODIUM 40 MG/0.4ML ~~LOC~~ SOLN
40.0000 mg | SUBCUTANEOUS | Status: DC
  Administered 2011-08-05: 40 mg via SUBCUTANEOUS
  Filled 2011-08-05 (×2): qty 0.4

## 2011-08-05 MED ORDER — METOPROLOL SUCCINATE ER 50 MG PO TB24
50.0000 mg | ORAL_TABLET | Freq: Every day | ORAL | Status: DC
  Administered 2011-08-05 – 2011-08-06 (×2): 50 mg via ORAL
  Filled 2011-08-05 (×2): qty 1

## 2011-08-05 NOTE — ED Notes (Signed)
Re-paged Dr. Herbie Baltimore x3 to [phone]

## 2011-08-05 NOTE — Care Management Note (Signed)
    Page 1 of 1   08/06/2011     2:44:03 PM   CARE MANAGEMENT NOTE 08/06/2011  Patient:  Robert [name], Robert [name]   Account Number:  [id number]  Date Initiated:  08/05/2011  Documentation initiated by:  Darlyne Russian  Subjective/Objective Assessment:   Patient admitted with TIA     Action/Plan:   Progression of care and discharge planning   Anticipated DC Date:  08/09/2011   Anticipated DC Plan:  HOME/SELF CARE      DC Planning Services  CM consult      Choice offered to / List presented to:             Status of service:  In process, will continue to follow Medicare Important Message given?   (If response is "NO", the following Medicare IM given date fields will be blank) Date Medicare IM given:   Date Additional Medicare IM given:    Discharge Disposition:    Per UR Regulation:  Reviewed for med. necessity/level of care/duration of stay  If discussed at Long Length of Stay Meetings, dates discussed:    Comments:  08/06/11 Onnie Boer, RN, BSN 1438 PT HAS CARDIOMYOPATHY.  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  PT IS HIS FAMILIES ONLY PROVIDER.  PT DRIVES IN BOTH OF HIS JOBS. WILL TALK WITH FINANCIAL COORDINATORS AGAIN.  WILL F/U.  08/05/11 Onnie Boer, RN, BSN 1452 PT IS FROM HOME WITH SELF CARE.  PT STATED THAT HE WOULD LIKE TO GO HOME AND NEEDS ASSISTANCE WITH HIS HOSPITAL BILLS.  FINANCIAL COUNCILING WAS NOTIFIED OF THE PT'S CONCERNS.  08/05/2011 0900 Darlyne Russian RN  [phone] Utilization review comnpleted.

## 2011-08-05 NOTE — Progress Notes (Signed)
Utilization review completed. Deanna Boehlke RN, CCM  

## 2011-08-05 NOTE — Progress Notes (Signed)
Pt. Had 4 runs v-tach that lasted for about 3-4 seconds. Pt under no s/s distress. Strips placed in chart.

## 2011-08-05 NOTE — Evaluation (Signed)
Occupational Therapy Evaluation Patient Details Name: Robert [name] MRN: [id number] DOB: March 12, 1956 Today's Date: 08/05/2011 Time: 3086-5784 OT Time Calculation (min): 14 min  OT Assessment / Plan / Recommendation Clinical Impression  This 56 y.o. male admitted with Rt. LE weakness and ACID firing.  Pt. appears back to baseline with no OT deficits noted.  Will sign off.    OT Assessment  Patient does not need any further OT services    Follow Up Recommendations  No OT follow up    Equipment Recommendations  None recommended by OT    Frequency      Precautions / Restrictions Precautions Precautions: None Restrictions Weight Bearing Restrictions: No       ADL  Eating/Feeding: Simulated;Independent Where Assessed - Eating/Feeding: Edge of bed Grooming: Simulated;Wash/dry hands;Wash/dry face;Teeth care;Brushing hair;Independent (Pt. reports he performed earlier) Where Assessed - Grooming: Standing at sink Upper Body Bathing: Simulated;Independent Where Assessed - Upper Body Bathing: Sitting, chair Lower Body Bathing: Simulated;Independent Where Assessed - Lower Body Bathing: Standing at sink Upper Body Dressing: Simulated;Independent Where Assessed - Upper Body Dressing: Sitting, chair Lower Body Dressing: Simulated;Independent Where Assessed - Lower Body Dressing: Sit to stand from chair;Sit to stand from bed Toilet Transfer: Simulated;Independent Toilet Transfer Method: Proofreader: Regular height toilet Toileting - Clothing Manipulation: Simulated;Independent Where Assessed - Toileting Clothing Manipulation: Standing Toileting - Hygiene: Simulated;Independent Where Assessed - Toileting Hygiene: Sit on 3-in-1 or toilet Tub/Shower Transfer: Simulated;Independent Tub/Shower Transfer Method: Science writer: Walk in shower Ambulation Related to ADLs: Independent ADL Comments: Pt. reports he performed ADLs this morning  independently    OT Goals    Visit Information  Last OT Received On: 08/05/11    Subjective Data  Subjective: "Everything is back to normal.  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 for tasks performed    Extremity/Trunk Assessment Right Upper Extremity Assessment RUE ROM/Strength/Tone: Within functional levels RUE Sensation: WFL - Light Touch RUE Coordination: WFL - gross/fine motor Left Upper Extremity Assessment LUE ROM/Strength/Tone: Within functional levels LUE Sensation: WFL - Light Touch LUE Coordination: WFL - gross/fine motor Trunk Assessment Trunk Assessment: Normal   Mobility Bed Mobility Bed Mobility: Supine to Sit;Sitting - Scoot to Edge of Bed Supine to Sit: 7: Independent;HOB flat Sitting - Scoot to Edge of Bed: 7: Independent Transfers Transfers: Sit to Stand;Stand to Sit Sit to Stand: 7: Independent Stand to Sit: 7: Independent   Exercise    Balance Balance Balance Assessed: Yes Dynamic Standing Balance Dynamic Standing - Level of Assistance: 7: Independent Standardized Balance Assessment Standardized Balance Assessment:  (ADLs) High Level Balance High Level  Balance Comments: Able to retrieve items from floor independently  End of Session OT - End of Session Activity Tolerance: Patient tolerated treatment well Patient left: in bed;with call bell/phone within reach   Jasmeen Fritsch M 08/05/2011, 3:25 PM

## 2011-08-05 NOTE — Progress Notes (Signed)
Reviewed admission H&P and orders.  Continue current management.  Reviewed prelim doppler results.  Echo results pending.     Cleora Fleet, MD, CDE, FAAFP [facility] [place], [place]  [phone]

## 2011-08-05 NOTE — ED Notes (Signed)
Patient transported to X-ray 

## 2011-08-05 NOTE — H&P (Signed)
Robert [name] is an 56 y.o. male.   PCP - Dr.Critoru. Chief Complaint: Right lower extremity weakness. HPI: 56 year-old male history of nonischemic cardiomyopathy status post AICD placement presented to the ER because of complaints of weakness in the right lower extremity and also his AICD firing. 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 All his symptoms resolved by 7:30 PM. And at 10:30 PM his AICD fired and he states it has never happened before. In addition he also found his blood pressure was high. He decided to come to the ER. In the ER CT of the head was negative and physical examination did not reveal any focal deficits and his ICD was interrogated by the tech. Patient will be admitted for further observation.   patient denies any visual symptoms, any upper or lower strength weakness at this time. He states that he's been having some problems breathing when he lies down for last 4 months which started happening after an upper respiratory infection. Denies any fever chills, has a nonproductive cough.   Past Medical History  Diagnosis Date  . AICD (automatic cardioverter/defibrillator) present   . Hypertension   . Congestive heart failure   . Pneumonia   . Bronchitis     Past Surgical History  Procedure Date  . Cardiac defibrillator placement   . Cardiac catheterization     History reviewed. No pertinent family history. Social History:  reports that he has never smoked. He does not have any smokeless tobacco history on file. He reports that he does not drink alcohol or use illicit drugs.  Allergies: No Known Allergies   (Not in a hospital admission)  Results for orders placed during the  hospital encounter of 08/04/11 (from the past 48 hour(s))  PROTIME-INR     Status: Normal   Collection Time   08/05/11 12:26 AM      Component Value Range Comment   Prothrombin Time 14.8  11.6 - 15.2 (seconds)    INR 1.14  0.00 - 1.49    APTT     Status: Normal   Collection Time   08/05/11 12:26 AM      Component Value Range Comment   aPTT 31  24 - 37 (seconds)   CBC     Status: Normal   Collection Time   08/05/11 12:26 AM      Component Value Range Comment   WBC 10.3  4.0 - 10.5 (K/uL)    RBC 5.20  4.22 - 5.81 (MIL/uL)    Hemoglobin 15.2  13.0 - 17.0 (g/dL)    HCT 45.4  09.8 - 11.9 (%)    MCV 87.9  78.0 - 100.0 (fL)    MCH 29.2  26.0 - 34.0 (pg)    MCHC 33.3  30.0 - 36.0 (g/dL)    RDW 14.7  82.9 - 56.2 (%)    Platelets 161  150 - 400 (K/uL)   DIFFERENTIAL     Status: Normal   Collection Time   08/05/11 12:26 AM      Component Value Range Comment   Neutrophils Relative 66  43 - 77 (%)    Neutro Abs 6.8  1.7 - 7.7 (K/uL)  Lymphocytes Relative 26  12 - 46 (%)    Lymphs Abs 2.6  0.7 - 4.0 (K/uL)    Monocytes Relative 8  3 - 12 (%)    Monocytes Absolute 0.8  0.1 - 1.0 (K/uL)    Eosinophils Relative 1  0 - 5 (%)    Eosinophils Absolute 0.1  0.0 - 0.7 (K/uL)    Basophils Relative 0  0 - 1 (%)    Basophils Absolute 0.0  0.0 - 0.1 (K/uL)   COMPREHENSIVE METABOLIC PANEL     Status: Abnormal   Collection Time   08/05/11 12:26 AM      Component Value Range Comment   Sodium 136  135 - 145 (mEq/L)    Potassium 4.1  3.5 - 5.1 (mEq/L)    Chloride 102  96 - 112 (mEq/L)    CO2 24  19 - 32 (mEq/L)    Glucose, Bld 103 (*) 70 - 99 (mg/dL)    BUN 24 (*) 6 - 23 (mg/dL)    Creatinine, Ser 4.09 (*) 0.50 - 1.35 (mg/dL)    Calcium 9.1  8.4 - 10.5 (mg/dL)    Total Protein 6.3  6.0 - 8.3 (g/dL)    Albumin 3.6  3.5 - 5.2 (g/dL)    AST 52 (*) 0 - 37 (U/L)    ALT 55 (*) 0 - 53 (U/L)    Alkaline Phosphatase 63  39 - 117 (U/L)    Total Bilirubin 0.9  0.3 - 1.2 (mg/dL)    GFR calc non Af Amer 47 (*)  >90 (mL/min)    GFR calc Af Amer 54 (*) >90 (mL/min)   CK TOTAL AND CKMB     Status: Abnormal   Collection Time   08/05/11 12:27 AM      Component Value Range Comment   Total CK 164  7 - 232 (U/L)    CK, MB 4.8 (*) 0.3 - 4.0 (ng/mL)    Relative Index 2.9 (*) 0.0 - 2.5    TROPONIN I     Status: Normal   Collection Time   08/05/11 12:27 AM      Component Value Range Comment   Troponin I <0.30  <0.30 (ng/mL)    Ct Head Wo Contrast  08/05/2011  *RADIOLOGY REPORT*  Clinical Data: Right leg weakness and pain  CT HEAD WITHOUT CONTRAST  Technique:  Contiguous axial images were obtained from the base of the skull through the vertex without contrast.  Comparison: 07/16/2009  Findings: Wallace Cullens white differentiation is maintained.  No CT evidence of acute large territory infarct. Basal ganglial calcifications. No intraparenchymal or extra-axial mass or hemorrhage.  Normal size and configuration of the ventricles and basilar cisterns.  No midline shift.  Vascular calcifications.  Limited visualization of the paranasal sinuses mastoid air cells is normal.  Regional soft tissues are normal.  No displaced calvarial fracture.  IMPRESSION: Negative noncontrast head CT.  Original Report Authenticated By: Waynard Reeds, M.D.    Review of Systems  Constitutional: Negative.   HENT: Negative.   Eyes: Negative.   Respiratory: Positive for shortness of breath.   Cardiovascular: Negative.   Gastrointestinal: Negative.   Genitourinary: Negative.   Musculoskeletal: Negative.   Skin: Negative.   Neurological:       Right lower extremity weakness.  Endo/Heme/Allergies: Negative.   Psychiatric/Behavioral: Negative.     Blood pressure 108/84, pulse 87, temperature 98 F (36.7 C), temperature source Oral, resp. rate 27, SpO2 94.00%. Physical Exam  Constitutional: He is oriented to person, place, and time. He appears well-developed and well-nourished. No distress.  HENT:  Head: Normocephalic and atraumatic.  Right  Ear: External ear normal.  Left Ear: External ear normal.  Nose: Nose normal.  Mouth/Throat: Oropharynx is clear and moist. No oropharyngeal exudate.  Eyes: Conjunctivae are normal. Pupils are equal, round, and reactive to light. Right eye exhibits no discharge. Left eye exhibits no discharge. No scleral icterus.  Neck: Normal range of motion. Neck supple.  Cardiovascular: Normal rate and regular rhythm.   Respiratory: Effort normal and breath sounds normal. No respiratory distress. He has no wheezes. He has no rales.  GI: Soft. Bowel sounds are normal. He exhibits no distension. There is no tenderness. There is no rebound.  Musculoskeletal: Normal range of motion. He exhibits no edema and no tenderness.  Neurological: He is alert and oriented to person, place, and time.       Moves all extremities 5/5. No obvious facial asymmetry. Tongue is midline.  Skin: Skin is warm and dry. He is not diaphoretic.  Psychiatric: His behavior is normal.     Assessment/Plan  #1. 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 Lu Duffel. At this time MRI of the brain is not possible because of his AICD and CT angiogram of the neck and brain is also not possible because of his increased creatinine. Will place patient on neuro checks and get carotid Doppler and 2-D echo. Aspirin. #2. Cardiomyopathy with AICD firing - further recommendations per cardiology. #3. Possible chronic kidney disease - closely follow metabolic panel. Patient is on lisinopril any further worsening of creatinine may have to hold ACE inhibitors.  Patient does complain of increasing shortness of breath on lying and coughs. I have ordered a chest x-ray the results of the chest were pending. We'll also check a BNP. Patient's home medications and dosages are not clear. Needs to be addressed in a.m. CODE STATUS - full code. Louanna Vanliew N. 08/05/2011, 4:35 AM

## 2011-08-05 NOTE — Progress Notes (Addendum)
  Echocardiogram 2D Echocardiogram with Definity has been performed.  Cathie Beams Deneen 08/05/2011, 10:07 AM

## 2011-08-05 NOTE — ED Provider Notes (Signed)
History     CSN: [id number]  Arrival date & time 08/04/11  2253   First MD Initiated Contact with Patient 08/05/11 0015      Chief Complaint  Patient presents with  . AICD Problem    (Consider location/radiation/quality/duration/timing/severity/associated sxs/prior treatment) HPI Comments: 56 year old male with a history of hypertension, congestive heart failure and defibrillator placement for recurrent episodes of syncope and nonsustained ventricular tachycardia. The patient is treated primarily by [facility], has a [employer] defibrillator which was placed in 2011. He states that approximately 24 hours ago he had occasional palpitations and one episode of firing of his defibrillator. Since that time he has improved significantly but at 6:30 this evening he developed acute onset of right lower extremity weakness. 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 When he got home he felt like he was dragging his leg and some difficulty walking the stairs.  He admits that around 7:30 in the evening his symptoms have completely resolved. He has no focal weakness numbness difficulty talking or change in vision at this time. He has no chest pain, no palpitations and otherwise is feeling okay. He wanted to get checked out because of the weakness of his right leg.  The history is provided by the patient and medical records.    Past Medical History  Diagnosis Date  . AICD (automatic cardioverter/defibrillator) present   . Hypertension   . Congestive heart failure   . Pneumonia   . Bronchitis     Past Surgical History  Procedure Date  . Cardiac defibrillator placement   . Cardiac catheterization     History reviewed. No pertinent family history.  History  Substance Use Topics  . Smoking status: Never Smoker   . Smokeless tobacco: Not on file  . Alcohol Use: No       Review of Systems  All other systems reviewed and are negative.    Allergies  Review of patient's allergies indicates no known allergies.  Home Medications   Current Outpatient Rx  Name Route Sig Dispense Refill  . COENZYME Q10 30 MG PO CAPS Oral Take 30 mg by mouth 3 (three) times daily.    . OMEGA-3-ACID ETHYL ESTERS 1 G PO CAPS Oral Take 2 g by mouth 2 (two) times daily.    . ASPIRIN 325 MG PO TABS Oral Take 1 tablet (325 mg total) by mouth daily. 30 tablet 0  . DIGOXIN 0.125 MG PO TABS Oral Take 1 tablet (0.125 mg total) by mouth daily. 30 tablet 0  . FUROSEMIDE 40 MG PO TABS Oral Take 1 tablet (40 mg total) by mouth daily. 30 tablet 0  . LISINOPRIL 10 MG PO TABS Oral Take 1 tablet (10 mg total) by mouth daily. 30 tablet 0  . METOPROLOL SUCCINATE ER 25 MG PO TB24 Oral Take 3 tablets (75 mg total) by mouth daily. 90 tablet 0  . POTASSIUM CHLORIDE CRYS ER 20 MEQ PO TBCR Oral Take 1 tablet (20 mEq total) by mouth daily. 30 tablet 0  . SIMVASTATIN 20 MG PO TABS Oral Take 1 tablet (20 mg total) by mouth at bedtime. 30 tablet 0    BP 116/87  Pulse 83  Temp(Src) 97.6 F (36.4 C) (Oral)  Resp 18  Ht 6\' 1"  (1.854 m)  Wt 224 lb 3.3 oz (101.7 kg)  BMI 29.58 kg/m2  SpO2 92%  Physical Exam  Nursing note and vitals reviewed. Constitutional: He appears well-developed and well-nourished. No distress.  HENT:  Head: Normocephalic and atraumatic.  Mouth/Throat: Oropharynx is clear and moist. No oropharyngeal exudate.  Eyes: Conjunctivae and EOM are normal. Pupils are equal, round, and reactive to light. Right eye exhibits no discharge. Left eye exhibits no discharge. No scleral icterus.  Neck: Normal range of motion. Neck supple. No JVD present. No thyromegaly present.  Cardiovascular: Normal rate, regular rhythm and intact distal pulses.  Exam reveals no gallop and no friction rub.   Murmur ( Moderate systolic murmur) heard. Pulmonary/Chest: Effort normal and breath sounds  normal. No respiratory distress. He has no wheezes. He has no rales.  Abdominal: Soft. Bowel sounds are normal. He exhibits no distension and no mass. There is no tenderness.  Musculoskeletal: Normal range of motion. He exhibits no edema and no tenderness.  Lymphadenopathy:    He has no cervical adenopathy.  Neurological: He is alert. Coordination normal.       Neurologic exam:  Speech clear, pupils equal round reactive to light, extraocular movements intact  Normal peripheral visual fields Cranial nerves III through XII normal including no facial droop Follows commands, moves all extremities x4, normal strength to bilateral upper and lower extremities at all major muscle groups including grip Sensation normal to light touch and pinprick Coordination intact, no limb ataxia, finger-nose-finger normal Rapid alternating movements normal No pronator drift Gait normal   Skin: Skin is warm and dry. No rash noted. No erythema.  Psychiatric: He has a normal mood and affect. His behavior is normal.    ED Course  Procedures (including critical care time)   No results found.   Dg Chest 2 View  08/05/2011  *RADIOLOGY REPORT*  Clinical Data: Shortness of breath  CHEST - 2 VIEW  Comparison: 07/22/2009  Findings: Cardiomegaly.  Left chest wall battery pack with dual leads, projecting over the right atrium and right ventricle.  No focal consolidation or overt edema.  No pneumothorax or pleural effusion.  Multilevel degenerative changes.  No acute osseous change.  IMPRESSION: Cardiomegaly without focal consolidation or overt edema.  Original Report Authenticated By: Waneta Martins, M.D.   Ct Head Wo Contrast  08/05/2011  *RADIOLOGY REPORT*  Clinical Data: Right leg weakness and pain  CT HEAD WITHOUT CONTRAST  Technique:  Contiguous axial images were obtained from the base of the skull through the vertex without contrast.  Comparison: 07/16/2009  Findings: Wallace Cullens white differentiation is maintained.   No CT evidence of acute large territory infarct. Basal ganglial calcifications. No intraparenchymal or extra-axial mass or hemorrhage.  Normal size and configuration of the ventricles and basilar cisterns.  No midline shift.  Vascular calcifications.  Limited visualization of the paranasal sinuses mastoid air cells is normal.  Regional soft tissues are normal.  No displaced calvarial fracture.  IMPRESSION: Negative noncontrast head CT.  Original Report Authenticated By: Waynard Reeds, M.D.   Labs:  CMP normal, CBC normal, UDS normal, BNP elevated > 2900, trop negative,  Imaging:  showded cardiomegaly and non acute head CT   1.  TIA 2.  Non Ischemic Cardiomyopathy 3.  Ventricular Tachycardia   MDM  At this time the patient has a normal neurologic exam, vital signs are normal and his EKG is unchanged from a prior EKG. I suspect he has had a transient ischemic attack and does we'll place him on the TIA protocol.  ED ECG REPORT   Date:  08/07/2011   Rate: 87  Rhythm: normal sinus rhythm  QRS Axis: left  Intervals: Prolonged QRS  ST/T Wave abnormalities: nonspecific T wave changes  Conduction Disutrbances:nonspecific intraventricular conduction delay  Narrative Interpretation:   Old EKG Reviewed: Compared with EKG from 07/22/2009, no significant changes, T wave abnormalities in V5 and V6 persist  The care was also discussed with the [facility] as well as Dr. Herbie Baltimore with Cardiology.  Due to the status of the pt having a cardiomyopathy he was excluded from TIA protocol on CDU observation.  Thus the patient was admitted to the [facility] service.  Dr. Herbie Baltimore agreed that there was not a need for emergent cardiology consultation and the [employer] device was working appropriately according to the company - there was a short run of a tachycardia which was successfully cardioverted with one shock the day before the visit.    Labs reviewed, imaging reviewed  Disposition : Admit      Vida Roller, MD 08/07/11 [phone]

## 2011-08-05 NOTE — Progress Notes (Addendum)
Reason for Consult: V tach, ICD discharge, TIA   Referring Physician: [facility]   Robert [name] is an 56 y.o. male.    Chief Complaint:  ICD firing and Rt. Leg lack of control.  HPI: 56 year-old male history of nonischemic cardiomyopathy status post AICD placement presented to the ER because of complaints of weakness in the right lower extremity and also his AICD firing. 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 All his symptoms resolved by 7:30 PM. And at 10:30 PM his AICD fired and he states it has never happened before. In addition he also found his blood pressure was high. He decided to come to the ER. In the ER CT of the head was negative and physical examination did not reveal any focal deficits and his ICD was interrogated by the tech. Patient was admitted for further observation.   Patient denied any visual symptoms, any upper or lower strength weakness. He stated that he's been having some problems breathing when he lies down for last 4 months which started happening after an upper respiratory infection. Denies any fever chills, has a nonproductive cough. Denied chest pain.   Currently no complaints, his rt. Foot weakness has resolved.  Does have episodes of NSVT.  Pt. Related to me that his device fired prior to his leg weakness.  He stated his heart was racing then it slowed then it began racing and the device discharged.  He did not notify our office, continued working and then while driving developed rt. Foot and leg weakness and came to the hospital.  He has history of NICM that was diagnosed 2011 and on cath had normal coronary arteries.   Last Echo in hospital 07/18/09:  Left ventricle:  The cavity size was severely dilated. Systolic function was severely reduced. The estimated ejection fraction was in the range of 20% to 25%. Diffuse hypokinesis. Doppler parameters are consistent with restrictive physiology, indicative of decreased left ventricular diastolic compliance and/or increased left atrial pressure. Doppler parameters are consistent with elevated mean left atrial filling pressure. Cannot exclude apical thrombus. - Aortic valve: Trivial regurgitation. - Mitral valve: Moderate regurgitation. - Left atrium: The atrium was moderately dilated. - Pulmonary arteries: PA peak pressure: 35mm Hg (S).    Past Medical History  Diagnosis Date  . AICD (automatic cardioverter/defibrillator) present   . Hypertension   . Congestive heart failure   . Pneumonia   . Bronchitis     Past Surgical History  Procedure Date  . Cardiac defibrillator placement   . Cardiac catheterization     History reviewed. No pertinent family history. Social History:  reports that he has never smoked. He does not have any smokeless tobacco history on file. He reports that he does not drink alcohol or use illicit drugs.  Allergies: No Known Allergies  Medications Prior to Admission  Medication Sig Dispense Refill  . aspirin EC 81 MG tablet Take 81 mg by mouth daily.      Marland Kitchen co-enzyme Q-10 30 MG capsule Take 30 mg by mouth 3 (three) times daily.      Marland Kitchen lisinopril (PRINIVIL,ZESTRIL) 20 MG tablet Take 20 mg by mouth daily.      Marland Kitchen  metoprolol (LOPRESSOR) 50 MG tablet Take 50 mg by mouth 2 (two) times daily.      Marland Kitchen omega-3 acid ethyl esters (LOVAZA) 1 G capsule Take 2 g by mouth 2 (two) times daily.      Marland Kitchen DISCONTD: LISINOPRIL PO Take 1 tablet by mouth daily.      Marland Kitchen DISCONTD: PRESCRIPTION MEDICATION Take 1 tablet by mouth 2 (two) times daily. Metoprolol tartrate        Results for orders placed during the hospital encounter of 08/04/11 (from the past 48 hour(s))  PROTIME-INR     Status: Normal    Collection Time   08/05/11 12:26 AM      Component Value Range Comment   Prothrombin Time 14.8  11.6 - 15.2 (seconds)    INR 1.14  0.00 - 1.49    APTT     Status: Normal   Collection Time   08/05/11 12:26 AM      Component Value Range Comment   aPTT 31  24 - 37 (seconds)   CBC     Status: Normal   Collection Time   08/05/11 12:26 AM      Component Value Range Comment   WBC 10.3  4.0 - 10.5 (K/uL)    RBC 5.20  4.22 - 5.81 (MIL/uL)    Hemoglobin 15.2  13.0 - 17.0 (g/dL)    HCT 40.9  81.1 - 91.4 (%)    MCV 87.9  78.0 - 100.0 (fL)    MCH 29.2  26.0 - 34.0 (pg)    MCHC 33.3  30.0 - 36.0 (g/dL)    RDW 78.2  95.6 - 21.3 (%)    Platelets 161  150 - 400 (K/uL)   DIFFERENTIAL     Status: Normal   Collection Time   08/05/11 12:26 AM      Component Value Range Comment   Neutrophils Relative 66  43 - 77 (%)    Neutro Abs 6.8  1.7 - 7.7 (K/uL)    Lymphocytes Relative 26  12 - 46 (%)    Lymphs Abs 2.6  0.7 - 4.0 (K/uL)    Monocytes Relative 8  3 - 12 (%)    Monocytes Absolute 0.8  0.1 - 1.0 (K/uL)    Eosinophils Relative 1  0 - 5 (%)    Eosinophils Absolute 0.1  0.0 - 0.7 (K/uL)    Basophils Relative 0  0 - 1 (%)    Basophils Absolute 0.0  0.0 - 0.1 (K/uL)   COMPREHENSIVE METABOLIC PANEL     Status: Abnormal   Collection Time   08/05/11 12:26 AM      Component Value Range Comment   Sodium 136  135 - 145 (mEq/L)    Potassium 4.1  3.5 - 5.1 (mEq/L)    Chloride 102  96 - 112 (mEq/L)    CO2 24  19 - 32 (mEq/L)    Glucose, Bld 103 (*) 70 - 99 (mg/dL)    BUN 24 (*) 6 - 23 (mg/dL)    Creatinine, Ser 0.86 (*) 0.50 - 1.35 (mg/dL)    Calcium 9.1  8.4 - 10.5 (mg/dL)    Total Protein 6.3  6.0 - 8.3 (g/dL)    Albumin 3.6  3.5 - 5.2 (g/dL)    AST 52 (*) 0 - 37 (U/L)    ALT 55 (*) 0 - 53 (U/L)    Alkaline Phosphatase 63  39 - 117 (U/L)    Total Bilirubin 0.9  0.3 -  1.2 (mg/dL)    GFR calc non Af Amer 47 (*) >90 (mL/min)    GFR calc Af Amer 54 (*) >90 (mL/min)   CK TOTAL AND CKMB     Status:  Abnormal   Collection Time   08/05/11 12:27 AM      Component Value Range Comment   Total CK 164  7 - 232 (U/L)    CK, MB 4.8 (*) 0.3 - 4.0 (ng/mL)    Relative Index 2.9 (*) 0.0 - 2.5    TROPONIN I     Status: Normal   Collection Time   08/05/11 12:27 AM      Component Value Range Comment   Troponin I <0.30  <0.30 (ng/mL)   HEMOGLOBIN A1C     Status: Abnormal   Collection Time   08/05/11  6:48 AM      Component Value Range Comment   Hemoglobin A1C 5.8 (*) <5.7 (%)    Mean Plasma Glucose 120 (*) <117 (mg/dL)   CBC     Status: Abnormal   Collection Time   08/05/11  6:48 AM      Component Value Range Comment   WBC 7.5  4.0 - 10.5 (K/uL)    RBC 4.99  4.22 - 5.81 (MIL/uL)    Hemoglobin 14.7  13.0 - 17.0 (g/dL)    HCT 40.9  81.1 - 91.4 (%)    MCV 87.4  78.0 - 100.0 (fL)    MCH 29.5  26.0 - 34.0 (pg)    MCHC 33.7  30.0 - 36.0 (g/dL)    RDW 78.2  95.6 - 21.3 (%)    Platelets 145 (*) 150 - 400 (K/uL)   CREATININE, SERUM     Status: Abnormal   Collection Time   08/05/11  6:48 AM      Component Value Range Comment   Creatinine, Ser 1.45 (*) 0.50 - 1.35 (mg/dL)    GFR calc non Af Amer 53 (*) >90 (mL/min)    GFR calc Af Amer 61 (*) >90 (mL/min)   PRO B NATRIURETIC PEPTIDE     Status: Abnormal   Collection Time   08/05/11  6:48 AM      Component Value Range Comment   Pro B Natriuretic peptide (BNP) 2992.0 (*) 0 - 125 (pg/mL)    Dg Chest 2 View  08/05/2011  *RADIOLOGY REPORT*  Clinical Data: Shortness of breath  CHEST - 2 VIEW  Comparison: 07/22/2009  Findings: Cardiomegaly.  Left chest wall battery pack with dual leads, projecting over the right atrium and right ventricle.  No focal consolidation or overt edema.  No pneumothorax or pleural effusion.  Multilevel degenerative changes.  No acute osseous change.  IMPRESSION: Cardiomegaly without focal consolidation or overt edema.  Original Report Authenticated By: Waneta Martins, M.D.   Ct Head Wo Contrast  08/05/2011  *RADIOLOGY REPORT*   Clinical Data: Right leg weakness and pain  CT HEAD WITHOUT CONTRAST  Technique:  Contiguous axial images were obtained from the base of the skull through the vertex without contrast.  Comparison: 07/16/2009  Findings: Wallace Cullens white differentiation is maintained.  No CT evidence of acute large territory infarct. Basal ganglial calcifications. No intraparenchymal or extra-axial mass or hemorrhage.  Normal size and configuration of the ventricles and basilar cisterns.  No midline shift.  Vascular calcifications.  Limited visualization of the paranasal sinuses mastoid air cells is normal.  Regional soft tissues are normal.  No displaced calvarial fracture.  IMPRESSION: Negative noncontrast head CT.  Original Report Authenticated By: Waynard Reeds, M.D.    ROS: General:mild resp infection recently, no syncope, no lightheadedness   Skin:no rashes or ulcers HEENT: no blurred vision CV:no chest pain ZOX:[id number] mild SOB  GI:no diarrhea, no constipation, no melena GU:no hematuria MS:no arthritic complaints Neuro:no syncope only rt. Foot/leg issues Endo:no diabetes Pt. also relates he becomes nauseated after he takes his metoprolol.   Blood pressure 129/90, pulse 69, temperature 97.8 F (36.6 C), temperature source Oral, resp. rate 20, height 6\' 1"  (1.854 m), weight 101.7 kg (224 lb 3.3 oz), SpO2 99.00%. PE: General:alert, oriented X 3 MAE, pleasant affect Skin:warm and dry HEENT:normocephalic, sclera clear Neck:supple, no JVD Heart:S1S2 RRR +S4  Lungs:clear without rales, rhonchi or wheezes Abd:+ BS, soft, non tender Ext:no edema, 1 + pedal pulses Neuro:alert and oriented X3, MAE, follows commands    Assessment/Plan Patient Active Problem List  Diagnoses  . ESSENTIAL HYPERTENSION, BENIGN  . NICM (nonischemic cardiomyopathy), 2011, normal cors at cath  . VENTRICULAR TACHYCARDIA  . Chronic systolic heart failure  . HEMORRHOIDS  . AUTOMATIC IMPLANTABLE CARDIAC DEFIBRILLATOR SITU  . AICD  (automatic cardioverter/defibrillator) present, Medtronic placed 2011  . Congestive heart failure  . Pneumonia  . Bronchitis  . TIA (transient ischemic attack)   PLAN: Cannot not find device interrogation from admit.  Will recheck device.  ? PAFib causing TIA?  MD to see.   TIA occurred after ICD shock.  Will check echo with contrast for clots.  Pro BNP elevated >2000,  Currently not on betablocker will readd and continue diuretic.  INGOLD,LAURA R 08/05/2011, 1:27 PM    I have seen and examined the patient along with [facility] R, NP.  I have reviewed the chart, notes and new data.  I agree with NP's note.  Key new complaints: dyspnea; no syncope of further ICD therapies. Took some type of Mucinex cold remedy evening before shock Key examination changes: tachypnea, S3+ve, no rales no JVD or edema Key new findings / data: ICD interrogation shows sustained VT, 214 bpm (VF zone>200). ATP during charge initially successful, but VT recurs. Committed to shock afte last ATP and had shock although rhythm seemed to be supraventricular for the 4 beats before shock. VT recurs after shock, then has a "dirty" break after another round of ATP.   PLAN: Possible triggers (CHF exacerbation present by history and exam; possible OTC sympathomimetic decongestant) should be removed. He had cut back his dose of beta blocker of his own accord due to fatigue (and did feel better afterwards). Will try metoprolol succinate instead of tartrate. Consider antiarrhythmic therapy. He is young and one would like to avoid amiodarone if possible.  Will ask Dr. Graciela Husbands to see for EP consultation. Check echo - look for intracardiac thrombus as cause for TIA.   Thurmon Fair, MD, [facility] [phone] 08/05/2011, 3:27 PM

## 2011-08-05 NOTE — ED Notes (Signed)
Patient transported to CT 

## 2011-08-05 NOTE — Progress Notes (Signed)
Bilateral carotid artery duplex completed.  Preliminary report is no evidence of significant ICA stenosis. 

## 2011-08-05 NOTE — Consult Note (Signed)
ELECTROPHYSIOLOGY CONSULT NOTE    Patient ID: Robert [name] MRN: [id number], DOB/AGE: [birth year] 56 y.o.  Admit date: 08/04/2011 Date of Consult: 08-05-2011  Primary Cardiologist: Thurmon Fair, MD  Reason for Consultation: VT with appropriate ICD therapy  HPI:  Robert [name] is a 56 year old male with a history of non ischemic cardiomyopathy and syncope.  He was implanted in April 2011 with a dual chamber Medtronic ICD.  His past medical history is also significant for hypertension and congestive heart failure.    The patient has had 2 previous episodes of VT according to [facility] transmission.  One in February of 2012 and one in May of 2012.  The episode in February of 2012 was a VT with a cycle length of 350-473msec with a positive R wave on nearfield EGM.  The episode in May of 2012 was a VT of with a negative R wave on nearfield EGM.  Both episodes were terminated with one round of burst ATP.  Both episodes were in the VF zone.   Since May of 2012, he had no episodes until 08-03-2011.  On 4-30, he had 2 episodes of VT in the VF zone.  The first was at 10:30 AM and was VT with a cycle length of with negative deflection on nearfield EGM.  It was terminated with one round of ATP.  The next episode also had an initiation time of 10:30 and the EGM starts with the patient in VT at a cycle length of with a negative R wave.  He received one round of ATP which terminated the VT and restored SR but only for 5 beats (device requires 8 to terminate an episode), his VT then resumed at the same rate and he received another 2 bursts of ATP which also failed to terminate the episode.  At charge end, he had 5 beats of SR but 2 were at intervals that were in the VT zone which resulted in shock delivery of 35J which actually resumed the tachycardia.  The VT then broke on it's own.    Device interrogation also reveals an increased heart rate, decreased atrial pacing, and an  increase in his NSVT burden over the last 2 weeks.  According to Dr Renaye Rakers note, the patient decreased his beta blocker because of fatigue recently.  Heart failure diagnostics have a slight up trend.   On the day of admission, following ICD shocks, he complained of right lower extremity weakness that prompted evaluation in the ER. Potassium was normal on admission, creatinine was elevated at 1.61, Magnesium was not drawn.   EP has been asked to evaluate for treatment options.   Past Medical History  Diagnosis Date  . AICD (automatic cardioverter/defibrillator) present- Medtronic  2011  . Hypertension   . Congestive heart failure   . Pneumonia   . Bronchitis      Surgical History:  Past Surgical History  Procedure Date  . Cardiac defibrillator placement   . Cardiac catheterization      Prescriptions prior to admission  Medication Sig Dispense Refill  . aspirin EC 81 MG tablet Take 81 mg by mouth daily.      Marland Kitchen co-enzyme Q-10 30 MG capsule Take 30 mg by mouth 3 (three) times daily.      Marland Kitchen lisinopril (PRINIVIL,ZESTRIL) 20 MG tablet Take 20 mg by mouth daily.      . metoprolol (LOPRESSOR) 50 MG tablet Take 50 mg by mouth 2 (two) times daily.      Marland Kitchen  omega-3 acid ethyl esters (LOVAZA) 1 G capsule Take 2 g by mouth 2 (two) times daily.        Inpatient Medications:    . aspirin  325 mg Oral Daily  . enoxaparin  40 mg Subcutaneous Q24H  . furosemide  40 mg Oral Daily  . lisinopril  10 mg Oral Daily  . metoprolol  25 mg Oral BID  . potassium chloride  20 mEq Oral Daily    Allergies: No Known Allergies  History   Social History  . Marital Status: Single    Spouse Name: N/A    Number of Children: 2  . Years of Education: N/A   Social History Main Topics  . Smoking status: Never Smoker   . Smokeless tobacco: Not on file  . Alcohol Use: No  . Drug Use: No  . Sexually Active:    History reviewed. No pertinent family history.  BP 129/90  Pulse 69  Temp(Src) 97.8 F (36.6 C)  (Oral)  Resp 20  Ht 6\' 1"  (1.854 m)  Wt 224 lb 3.3 oz (101.7 kg)  BMI 29.58 kg/m2  SpO2 99%  Alert and oriented in no acute distress HENT- normal Eyes- EOMI, without scleral icterus Skin- warm and dry; without rashes LN-neg Icd pocket well healed Neck- supple without thyromegaly, JVP-flat, carotids brisk and full without bruits Back-without CVAT or kyphosis Lungs-clear to auscultation CV-Regular rate and rhythm, nl S1 and S2, no murmurs gallops or rubs, S4-absent Abd-soft with active bowel sounds; no midline pulsation or hepatomegaly Pulses-intact femoral and distal MKS-without gross deformity Neuro- Ax O, CN3-12 intact, grossly normal motor and sensory function Affect engaging    Labs:   Lab Results  Component Value Date   WBC 7.5 08/05/2011   HGB 14.7 08/05/2011   HCT 43.6 08/05/2011   MCV 87.4 08/05/2011   PLT 145* 08/05/2011    Lab 08/05/11 0648 08/05/11 0026  NA -- 136  K -- 4.1  CL -- 102  CO2 -- 24  BUN -- 24*  CREATININE 1.45* --  CALCIUM -- 9.1  PROT -- 6.3  BILITOT -- 0.9  ALKPHOS -- 63  ALT -- 55*  AST -- 52*  GLUCOSE -- 103*   Lab Results  Component Value Date   CKTOTAL 164 08/05/2011   CKMB 4.8* 08/05/2011   TROPONINI <0.30 08/05/2011    Radiology/Studies: Dg Chest 2 View 08/05/2011  *RADIOLOGY REPORT*  Clinical Data: Shortness of breath  CHEST - 2 VIEW  Comparison: 07/22/2009  Findings: Cardiomegaly.  Left chest wall battery pack with dual leads, projecting over the right atrium and right ventricle.  No focal consolidation or overt edema.  No pneumothorax or pleural effusion.  Multilevel degenerative changes.  No acute osseous change.  IMPRESSION: Cardiomegaly without focal consolidation or overt edema.  Original Report Authenticated By: Waneta Martins, M.D.   Ct Head Wo Contrast 08/05/2011  *RADIOLOGY REPORT*  Clinical Data: Right leg weakness and pain  CT HEAD WITHOUT CONTRAST  Technique:  Contiguous axial images were obtained from the base of the skull  through the vertex without contrast.  Comparison: 07/16/2009  Findings: Wallace Cullens white differentiation is maintained.  No CT evidence of acute large territory infarct. Basal ganglial calcifications. No intraparenchymal or extra-axial mass or hemorrhage.  Normal size and configuration of the ventricles and basilar cisterns.  No midline shift.  Vascular calcifications.  Limited visualization of the paranasal sinuses mastoid air cells is normal.  Regional soft tissues are normal.  No displaced  calvarial fracture.  IMPRESSION: Negative noncontrast head CT.  Original Report Authenticated By: Waynard Reeds, M.D.   EKG: SR with 1st degree AV block  .22/.12/38 BAE lvh and repol  TELEMETRY: sinus rhythm with first degree AV block, occasional atrial pacing.  Occasional runs of NSVT.   Impression and plan 1) VT as above  With appropriate RX 2) NICM  ?EF  25% 2 yrs ago with MOd MR 3) Class 2B CHF 4) s/p ICD-MED 5) recent discontinuation of his BB with increasing nonsustained VT burden 2/2 nausea   Rec 1) initiate BB would try something different as he has not toler metoprolol tart in the past >>will take liberty of trying succinate 2) hopefully can avoid antiarrhtymic therapy 3) no driving x 6 months

## 2011-08-06 DIAGNOSIS — I509 Heart failure, unspecified: Secondary | ICD-10-CM

## 2011-08-06 DIAGNOSIS — G459 Transient cerebral ischemic attack, unspecified: Secondary | ICD-10-CM

## 2011-08-06 DIAGNOSIS — I428 Other cardiomyopathies: Secondary | ICD-10-CM

## 2011-08-06 DIAGNOSIS — I472 Ventricular tachycardia: Secondary | ICD-10-CM

## 2011-08-06 DIAGNOSIS — I5022 Chronic systolic (congestive) heart failure: Secondary | ICD-10-CM

## 2011-08-06 LAB — LIPID PANEL
Cholesterol: 206 mg/dL — ABNORMAL HIGH (ref 0–200)
Total CHOL/HDL Ratio: 4.1 RATIO

## 2011-08-06 LAB — COMPREHENSIVE METABOLIC PANEL
AST: 31 U/L (ref 0–37)
Albumin: 3.8 g/dL (ref 3.5–5.2)
BUN: 21 mg/dL (ref 6–23)
Calcium: 9.4 mg/dL (ref 8.4–10.5)
Chloride: 101 mEq/L (ref 96–112)
Creatinine, Ser: 1.33 mg/dL (ref 0.50–1.35)
GFR calc non Af Amer: 59 mL/min — ABNORMAL LOW (ref >90)
Total Bilirubin: 1.7 mg/dL — ABNORMAL HIGH (ref 0.3–1.2)

## 2011-08-06 LAB — RAPID URINE DRUG SCREEN, HOSP PERFORMED
Amphetamines: NOT DETECTED
Barbiturates: NOT DETECTED
Opiates: NOT DETECTED
Tetrahydrocannabinol: NOT DETECTED

## 2011-08-06 LAB — CBC
Hemoglobin: 16.5 g/dL (ref 13.0–17.0)
MCV: 87.9 fL (ref 78.0–100.0)
Platelets: 157 10*3/uL (ref 150–400)
RBC: 5.56 MIL/uL (ref 4.22–5.81)
WBC: 9 10*3/uL (ref 4.0–10.5)

## 2011-08-06 MED ORDER — METOPROLOL SUCCINATE ER 50 MG PO TB24: 75.0000 mg | ORAL_TABLET | Freq: Every day | ORAL | Status: DC

## 2011-08-06 MED ORDER — POTASSIUM CHLORIDE CRYS ER 20 MEQ PO TBCR: 20.0000 meq | EXTENDED_RELEASE_TABLET | Freq: Every day | ORAL | Status: DC

## 2011-08-06 MED ORDER — SIMVASTATIN 10 MG PO TABS: 20.0000 mg | ORAL_TABLET | Freq: Every day | ORAL | Status: DC

## 2011-08-06 MED ORDER — FUROSEMIDE 40 MG PO TABS: 40.0000 mg | ORAL_TABLET | Freq: Every day | ORAL | Status: DC

## 2011-08-06 MED ORDER — DIGOXIN 125 MCG PO TABS
0.1250 mg | ORAL_TABLET | Freq: Every day | ORAL | Status: DC
  Filled 2011-08-06: qty 1

## 2011-08-06 MED ORDER — SIMVASTATIN 20 MG PO TABS: 20.0000 mg | ORAL_TABLET | Freq: Every day | ORAL | Status: DC

## 2011-08-06 MED ORDER — SIMVASTATIN 10 MG PO TABS
10.0000 mg | ORAL_TABLET | Freq: Every day | ORAL | Status: DC
  Filled 2011-08-06: qty 1

## 2011-08-06 MED ORDER — LISINOPRIL 10 MG PO TABS: 10.0000 mg | ORAL_TABLET | Freq: Every day | ORAL | Status: DC

## 2011-08-06 MED ORDER — METOPROLOL SUCCINATE ER 25 MG PO TB24: 75.0000 mg | ORAL_TABLET | Freq: Every day | ORAL | Status: DC

## 2011-08-06 MED ORDER — DIGOXIN 125 MCG PO TABS: 0.1250 mg | ORAL_TABLET | Freq: Every day | ORAL | Status: DC

## 2011-08-06 MED ORDER — ASPIRIN 325 MG PO TABS: 325.0000 mg | ORAL_TABLET | Freq: Every day | ORAL | Status: DC

## 2011-08-06 NOTE — Progress Notes (Signed)
[facility] Progress Note  08/06/2011   Subjective: Pt reports he had some nausea overnight that he thinks is related to taking metoprolol, he had slightly lower BP at the time but reports that he is feeling much better.  He is anxious about not being able to drive for 6 months because it will affect his ability to earn a living.    Objective:  Vital signs in last 24 hours: Filed Vitals:   08/05/11 2207 08/06/11 0201 08/06/11 0315 08/06/11 0543  BP: 117/89 97/74 115/87 100/82  Pulse: 77 86 80 83  Temp: 97 F (36.1 C) 97.5 F (36.4 C)  96.4 F (35.8 C)  TempSrc: Oral Oral  Oral  Resp: 20 24  20   Height:      Weight:      SpO2: 98% 98%  92%   Weight change:  No intake or output data in the 24 hours ending 08/06/11 0840 Lab Results  Component Value Date   HGBA1C 5.8* 08/05/2011   Lab Results  Component Value Date   LDLCALC 132* 08/06/2011   CREATININE 1.33 08/06/2011    Review of Systems As above, otherwise all reviewed and reported negative  Physical Exam General - awake, no distress, cooperative HEENT - NCAT, MMM Lungs - BBS, CTA CV - normal s1, s2 sounds Abd - soft, nondistended, no masses, nontender Ext - no C/C/E Neuro - nonfocal  Lab Results: Results for orders placed during the hospital encounter of 08/04/11 (from the past 24 hour(s))  URINE RAPID DRUG SCREEN (HOSP PERFORMED)     Status: Normal   Collection Time   08/05/11 11:05 PM      Component Value Range   Opiates NONE DETECTED  NONE DETECTED    Cocaine NONE DETECTED  NONE DETECTED    Benzodiazepines NONE DETECTED  NONE DETECTED    Amphetamines NONE DETECTED  NONE DETECTED    Tetrahydrocannabinol NONE DETECTED  NONE DETECTED    Barbiturates NONE DETECTED  NONE DETECTED   COMPREHENSIVE METABOLIC PANEL     Status: Abnormal   Collection Time   08/06/11  6:30 AM      Component Value Range   Sodium 137  135 - 145 (mEq/L)   Potassium 4.2  3.5 - 5.1 (mEq/L)   Chloride 101  96 - 112 (mEq/L)   CO2 26  19  - 32 (mEq/L)   Glucose, Bld 98  70 - 99 (mg/dL)   BUN 21  6 - 23 (mg/dL)   Creatinine, Ser 1.91  0.50 - 1.35 (mg/dL)   Calcium 9.4  8.4 - 47.8 (mg/dL)   Total Protein 6.9  6.0 - 8.3 (g/dL)   Albumin 3.8  3.5 - 5.2 (g/dL)   AST 31  0 - 37 (U/L)   ALT 55 (*) 0 - 53 (U/L)   Alkaline Phosphatase 63  39 - 117 (U/L)   Total Bilirubin 1.7 (*) 0.3 - 1.2 (mg/dL)   GFR calc non Af Amer 59 (*) >90 (mL/min)   GFR calc Af Amer 68 (*) >90 (mL/min)  CBC     Status: Normal   Collection Time   08/06/11  6:30 AM      Component Value Range   WBC 9.0  4.0 - 10.5 (K/uL)   RBC 5.56  4.22 - 5.81 (MIL/uL)   Hemoglobin 16.5  13.0 - 17.0 (g/dL)   HCT 29.5  62.1 - 30.8 (%)   MCV 87.9  78.0 - 100.0 (fL)   MCH 29.7  26.0 -  34.0 (pg)   MCHC 33.7  30.0 - 36.0 (g/dL)   RDW 40.9  81.1 - 91.4 (%)   Platelets 157  150 - 400 (K/uL)  LIPID PANEL     Status: Abnormal   Collection Time   08/06/11  6:30 AM      Component Value Range   Cholesterol 206 (*) 0 - 200 (mg/dL)   Triglycerides 782  <956 (mg/dL)   HDL 50  >21 (mg/dL)   Total CHOL/HDL Ratio 4.1     VLDL 24  0 - 40 (mg/dL)   LDL Cholesterol 308 (*) 0 - 99 (mg/dL)    Micro Results: No results found for this or any previous visit (from the past 240 hour(s)).  Medications:  Scheduled Meds:   . sodium chloride   Intravenous STAT  . aspirin  325 mg Oral Daily  . enoxaparin  40 mg Subcutaneous Q24H  . furosemide  40 mg Oral Daily  . lisinopril  10 mg Oral Daily  . metoprolol succinate  50 mg Oral Daily  . potassium chloride  20 mEq Oral Daily  . DISCONTD: metoprolol  25 mg Oral BID   Continuous Infusions:  PRN Meds:.  Assessment/Plan: Nonischemic cardiomyopathy - s/o AICD placement and recent ICD discharge - appreciate cardiology assistance, pt started on metoprolol succ by EP doc.  CHF - continue lasix, BB, lisinopril, potassium V tach - started on BB above Pt wants to go home.  I told him to wait until cardiology sees him and let them decide if he  is stable for discharge with close outpatient follow up.  Will check back later.    LOS: 2 days   Robert [name] 08/06/2011, 8:40 AM   Cleora Fleet, MD, CDE, FAAFP [facility] [place], [place]  [phone]

## 2011-08-06 NOTE — Discharge Instructions (Signed)
STROKE/TIA DISCHARGE INSTRUCTIONS SMOKING Cigarette smoking nearly doubles your risk of having a stroke & is the single most alterable risk factor  If you smoke or have smoked in the last 12 months, you are advised to quit smoking for your health.  Most of the excess cardiovascular risk related to smoking disappears within a year of stopping.  Ask you doctor about anti-smoking medications  [place] Quit Line: 1-800-QUIT NOW  Free Smoking Cessation Classes ([phone]  CHOLESTEROL Know your levels; limit fat & cholesterol in your diet  Lipid Panel     Component Value Date/Time   CHOL  Value: 170        ATP III CLASSIFICATION:  <200     mg/dL   Desirable  409-811  mg/dL   Borderline High  >=914    mg/dL   High        7/82/9562 0400   TRIG 110 07/22/2009 0400   HDL 42 07/22/2009 0400   CHOLHDL 4.0 07/22/2009 0400   VLDL 22 07/22/2009 0400   LDLCALC  Value: 106        Total Cholesterol/HDL:CHD Risk Coronary Heart Disease Risk Table                     Men   Women  1/2 Average Risk   3.4   3.3  Average Risk       5.0   4.4  2 X Average Risk   9.6   7.1  3 X Average Risk  23.4   11.0        Use the calculated Patient Ratio above and the CHD Risk Table to determine the patient's CHD Risk.        ATP III CLASSIFICATION (LDL):  <100     mg/dL   Optimal  130-865  mg/dL   Near or Above                    Optimal  130-159  mg/dL   Borderline  784-696  mg/dL   High  >295     mg/dL   Very High* 2/84/1324 0400      Many patients benefit from treatment even if their cholesterol is at goal.  Goal: Total Cholesterol (CHOL) less than 160  Goal:  Triglycerides (TRIG) less than 150  Goal:  HDL greater than 40  Goal:  LDL (LDLCALC) less than 100   BLOOD PRESSURE American Stroke Association blood pressure target is less that 120/80 mm/Hg  Your discharge blood pressure is:  BP: 122/88 mmHg  Monitor your blood pressure  Limit your salt and alcohol intake  Many individuals will require more than one medication  for high blood pressure  DIABETES (A1c is a blood sugar average for last 3 months) Goal HGBA1c is under 7% (HBGA1c is blood sugar average for last 3 months)  Diabetes: {STROKE DC DIABETES:22357}    No results found for this basename: HGBA1C     Your HGBA1c can be lowered with medications, healthy diet, and exercise.  Check your blood sugar as directed by your physician  Call your physician if you experience unexplained or low blood sugars.  PHYSICAL ACTIVITY/REHABILITATION Goal is 30 minutes at least 4 days per week    {STROKE DC ACTIVITY/REHAB:22359}  Activity decreases your risk of heart attack and stroke and makes your heart stronger.  It helps control your weight and blood pressure; helps you relax and can improve your mood.  Participate in a regular  exercise program.  Talk with your doctor about the best form of exercise for you (dancing, walking, swimming, cycling).  DIET/WEIGHT Goal is to maintain a healthy weight  Your discharge diet is: Carb Control *** liquids Your height is:  Height: 6\' 1"  (185.4 cm) Your current weight is: Weight: 101.7 kg (224 lb 3.3 oz) Your Body Mass Index (BMI) is:  BMI (Calculated): 29.6   Following the type of diet specifically designed for you will help prevent another stroke.  Your goal weight range is:  ***  Your goal Body Mass Index (BMI) is 19-24.  Healthy food habits can help reduce 3 risk factors for stroke:  High cholesterol, hypertension, and excess weight.  RESOURCES Stroke/Support Group:  Call [phone]  they meet the 3rd Sunday of the month on the Rehab Unit at [facility], [place] ( no meetings June, July & Aug).  STROKE EDUCATION PROVIDED/REVIEWED AND GIVEN TO PATIENT Stroke warning signs and symptoms How to activate emergency medical system (call 911). Medications prescribed at discharge. Need for follow-up after discharge. Personal risk factors for stroke. Pneumonia vaccine given:   {STROKE DC YES/NO/DATE:22363} Flu vaccine  given:   {STROKE DC YES/NO/DATE:22363} My questions have been answered, the writing is legible, and I understand these instructions.  I will adhere to these goals & educational materials that have been provided to me after my discharge from the hospital.   No driving for 6 months  Driving and Equipment Restrictions Some medical problems make it dangerous to drive, ride a bike, or use machines. Some of these problems are:  A hard blow to the head (concussion).   Passing out (fainting).   Twitching and shaking (seizures).   Low blood sugar.   Taking medicine to help you relax (sedatives).   Taking pain medicines.   Wearing an eye patch.   Wearing splints. This can make it hard to use parts of your body that you need to drive safely.  HOME CARE   Do not drive until your doctor says it is okay.   Do not use machines until your doctor says it is okay.  You may need a form signed by your doctor (medical release) before you can drive again. You may also need this form before you do other tasks where you need to be fully alert. MAKE SURE YOU:  Understand these instructions.   Will watch your condition.   Will get help right away if you are not doing well or get worse.  Document Released: 04/29/2004 Document Revised: 03/11/2011 Document Reviewed: 07/30/2009 [facility] Patient Information 2012 [place], [place].  Heart Failure Heart failure (HF) is a condition in which the heart has trouble pumping blood. This means your heart does not pump blood efficiently for your body to work well. In some cases of HF, fluid may back up into your lungs or you may have swelling (edema) in your lower legs. HF is a long-term (chronic) condition. It is important for you to take good care of yourself and follow your caregiver's treatment plan. CAUSES   Health conditions:   High blood pressure (hypertension) causes the heart muscle to work harder than normal. When pressure in the blood vessels is high,  the heart needs to pump (contract) with more force in order to circulate blood throughout the body. High blood pressure eventually causes the heart to become stiff and weak.   Coronary artery disease (CAD) is the buildup of cholesterol and fat (plaques) in the arteries of the heart. The blockage in  the arteries deprives the heart muscle of oxygen and blood. This can cause chest pain and may lead to a heart attack. High blood pressure can also contribute to CAD.   Heart attack (myocardial infarction) occurs when 1 or more arteries in the heart become blocked. The loss of oxygen damages the muscle tissue of the heart. When this happens, part of the heart muscle dies. The injured tissue does not contract as well and weakens the heart's ability to pump blood.   Abnormal heart valves can cause HF when the heart valves do not open and close properly. This makes the heart muscle pump harder to keep the blood flowing.   Heart muscle disease (cardiomyopathy or myocarditis) is damage to the heart muscle from a variety of causes. These can include drug or alcohol abuse, infections, or unknown reasons. These can increase the risk of HF.   Lung disease makes the heart work harder because the lungs do not work properly. This can cause a strain on the heart leading it to fail.   Diabetes increases the risk of HF. High blood sugar contributes to high fat (lipid) levels in the blood. Diabetes can also cause slow damage to tiny blood vessels that carry important nutrients to the heart muscle. When the heart does not get enough oxygen and food, it can cause the heart to become weak and stiff. This leads to a heart that does not contract efficiently.   Other diseases can contribute to HF. These include abnormal heart rhythms, thyroid problems, and low blood counts (anemia).   Unhealthy lifestyle habits:   Obesity.   Smoking.   Eating foods high in fat and cholesterol.   Eating or drinking beverages high in salt.    Drug or alcohol abuse.   Lack of exercise.  SYMPTOMS  HF symptoms may vary and can be hard to detect. Symptoms may include:  Shortness of breath with activity, such as climbing stairs.   Persistent cough.   Swelling of the feet, ankles, legs, or abdomen.   Unexplained weight gain.   Difficulty breathing when lying flat.   Waking from sleep because of the need to sit up and get more air.   Rapid heartbeat.   Fatigue and loss of energy.   Feeling lightheaded or close to fainting.  DIAGNOSIS  A diagnosis of HF is based on your history, symptoms, physical examination, and diagnostic tests. Diagnostic tests for HF may include:  EKG.   Chest X-ray.   Blood tests.   Exercise stress test.   Blood oxygen test (arterial blood gas).   Evaluation by a heart doctor (cardiologist).   Ultrasound evaluation of the heart (echocardiogram).   Heart artery test to look for blockages (angiogram).   Radioactive imaging to look at the heart (radionuclide test).  TREATMENT  Treatment is aimed at managing the symptoms of HF. Medicines, lifestyle changes, or surgical intervention may be necessary to treat HF.  Medicines to help treat HF may include:   Angiotensin-converting enzyme (ACE) inhibitors. These block the effects of a blood protein called angiotensin-converting enzyme. ACE inhibitors relax (dilate) the blood vessels and help lower blood pressure. This decreases the workload of the heart, slows the progression of HF, and improves symptoms.   Angiotensin receptor blockers (ARBs). These medications work similar to ACE inhibitors. ARBs may be an alternative for people who cannot tolerate an ACE inhibitor.   Aldosterone antagonists. This medication helps get rid of extra fluid from your body. This lowers the volume of  blood the heart has to pump.   Water pills (diuretics). Diuretics cause the kidneys to remove salt and water from the blood. The extra fluid is removed by  urination. By removing extra fluid from the body, diuretics help lower the workload of the heart and help prevent fluid buildup in the lungs so breathing is easier.   Beta blockers. These prevent the heart from beating too fast and improve heart muscle strength. Beta blockers help maintain a normal heart rate, control blood pressure, and improve HF symptoms.   Digitalis. This increases the force of the heartbeat and may be helpful to people with HF or heart rhythm problems.   Healthy lifestyle changes include:   Stopping smoking.   Eating a healthy diet. Avoid foods high in fat. Avoid foods fried in oil or made with fat. A dietician can help with healthy food choices.   Limiting how much salt you eat.   Limiting alcohol intake to no more than 1 drink per day for women and 2 drinks per day for men. Drinking more than that is harmful to your heart. If your heart has already been damaged by alcohol or you have severe HF, drinking alcohol should be stopped completely.   Exercising as directed by your caregiver.   Surgical treatment for HF may include:   Procedures to open blocked arteries, repair damaged heart valves, or remove damaged heart muscle tissue.   A pacemaker to help heart muscle function and to control certain abnormal heart rhythms.   A defibrillator to possibly prevent sudden cardiac death.  HOME CARE INSTRUCTIONS   Activity level. Your caregiver can help you determine what type of exercise program may be helpful. It is important to maintain your strength. Pace your physical activity to avoid shortness of breath or chest pain. Rest for 1 hour before and after meals. A cardiac rehabilitation program may be helpful to some people with HF.   Diet. Eat a heart healthy diet. Food choices should be low in saturated fat and cholesterol. Talk to a dietician to learn about heart healthy foods.   Salt intake. When you have HF, you need to limit the amount of salt you eat. Eat less than  1500 milligrams (mg) of salt per day or as recommended by your caregiver.   Weight monitoring. Weigh yourself every day. You should weigh yourself in the morning after you urinate and before you eat breakfast. Wear the same amount of clothing each time you weigh yourself. Record your weight daily. Bring your recorded weights to your clinic visits. Tell your caregiver right away if you have gained 3 lb/1.4 kg in 1 day, or 5 lb/2.3 kg in a week or whatever amount you were told to report.   Blood pressure monitoring. This should be done as directed by your caregiver. A home blood pressure cuff can be purchased at a drugstore. Record your blood pressure numbers and bring them to your clinic visits. Tell your caregiver if you become dizzy or lightheaded upon standing up.   Smoking. If you are currently a smoker, it is time to quit. Nicotine makes your heart work harder by causing your blood vessels to constrict. Do not use nicotine gum or patches before talking to your caregiver.   Follow up. Be sure to schedule a follow-up visit with your caregiver. Keep all your appointments.  SEEK MEDICAL CARE IF:   Your weight increases by 3 lb/1.4 kg in 1 day or 5 lb/2.3 kg in a week.  You notice increasing shortness of breath that is unusual for you. This may happen during rest, sleep, or with activity.   You cough more than normal, especially with physical activity.   You notice more swelling in your hands, feet, ankles, or belly (abdomen).   You are unable to sleep because it is hard to breathe.   You cough up bloody mucus (sputum).   You begin to feel "jumping" or "fluttering" sensations (palpitations) in your chest.  SEEK IMMEDIATE MEDICAL CARE IF:   You have severe chest pain or pressure which may include symptoms such as:   Pain or pressure in the arms, neck, jaw, or back.   Feeling sweaty.   Feeling sick to your stomach (nauseous).   Feeling short of breath while at rest.   Having a fast  or irregular heartbeat.   You experience stroke symptoms. These symptoms include:   Facial weakness or numbness.   Weakness or numbness in an arm, leg, or on one side of your body.   Blurred vision.   Difficulty talking or thinking.   Dizziness or fainting.   Severe headache.  THESE ARE MEDICAL EMERGENCIES. Do not wait to see if the symptoms go away. Call your local emergency services (911 in [place] DO NOT drive yourself to the hospital. IMPORTANT  Make a list of every medicine, vitamin, or herbal supplement you are taking. Keep the list with you at all times. Show it to your caregiver at every visit. Keep the list up-to-date.   Ask your caregiver or pharmacist to write an explanation of each medicine you are taking. This should include:   Why you are taking it.   The possible side effects.   The best time of day to take it.   Foods to take with it or what foods to avoid.   When to stop taking it.  MAKE SURE YOU:   Understand these instructions.   Will watch your condition.   Will get help right away if you are not doing well or get worse.  Document Released: 03/22/2005 Document Revised: 03/11/2011 Document Reviewed: 07/04/2009 [facility] Patient Information 2012 [place], [place].  1.5 Gram Low Sodium Diet A 1.5 gram sodium diet restricts the amount of salt in your diet. You can have no more than 1.5 grams (1500 miligrams) in 1 day. This can help lessen your risk for developing high blood pressure. This diet may also reduce your chance of having a heart attack or stroke. It is important that you know what to look for when choosing foods and drinks.  HOME CARE   Do not add salt to food.   Avoid convenience items and fast food.   Choose unsalted snack foods.   Buy products labeled "low sodium" or "no salt added" when possible.   Check food labels to learn how much sodium is in 1 serving.   When eating at a restaurant, ask that your food be prepared with less salt or  none, if possible.  The nutrition facts label is a good place to find how much sodium is in foods. Look for products with no more than 400 mg of sodium per serving. Remember that 1.5 g = 1500 mg. CHOOSING FOODS Grains  Avoid: Salted crackers and snack items. Some cereals, including instant hot cereals. Bread stuffing and biscuit mixes. Seasoned rice or pasta mixes.   Choose: Unsalted snack items. Low-sodium cereals, oats, puffed wheat and rice, shredded wheat. English muffins and bread. Pasta.  Meats  Avoid:  Salted, canned, smoked, spiced, pickled meats, including fish and poultry. Bacon, ham, sausage, cold cuts, hot dogs, anchovies.   Choose: Low-sodium canned tuna and salmon. Fresh or frozen meat, poultry, and fish.  Dairy  Avoid: Processed cheese and spreads. Cottage cheese. Buttermilk and condensed milk. Regular cheese.   Choose:  Milk. Low-sodium cottage cheese. Yogurt. Sour cream. Low-sodium cheese.  Fruits and Vegetables  Avoid:  Regular canned vegetables. Regular canned tomato sauce and paste. Frozen vegetables in sauces. Olives. Rosita Fire. Relishes. Sauerkraut.   Choose:  Low-sodium canned vegetables. Low-sodium tomato sauce and paste. Frozen or fresh vegetables. Fresh and frozen fruit.  Condiments  Avoid:  Canned and packaged gravies. Worcestershire sauce. Tartar sauce. Barbecue sauce. Soy sauce. Steak sauce. Ketchup. Onion, garlic, and table salt. Meat flavorings and tenderizers.   Choose:  Fresh and dried herbs and spices. Low-sodium varieties of mustard and ketchup. Lemon juice. Tabasco sauce. Horseradish.  Document Released: 04/24/2010 Document Revised: 03/11/2011 Document Reviewed: 04/24/2010 [facility] Patient Information 2012 [place], [place].

## 2011-08-06 NOTE — Progress Notes (Signed)
Pt having runs of v-tach strips placed in chart. MD made aware. Pt currently under no s/s distress.

## 2011-08-06 NOTE — Progress Notes (Signed)
[facility]  Subjective: No Complaints. Ready to go home.  Objective: Vital signs in last 24 hours: Temp:  [96.4 F (35.8 C)-97.9 F (36.6 C)] 97.6 F (36.4 C) (05/03 1000) Pulse Rate:  [77-97] 83  (05/03 1000) Resp:  [18-24] 18  (05/03 1000) BP: (97-117)/(74-89) 116/87 mmHg (05/03 1000) SpO2:  [92 %-98 %] 92 % (05/03 1000) Last BM Date: 08/06/11  Intake/Output from previous day:   Intake/Output this shift:    Medications Current Facility-Administered Medications  Medication Dose Route Frequency Provider Last Rate Last Dose  . 0.9 %  sodium chloride infusion   Intravenous STAT Vida Roller, MD      . aspirin tablet 325 mg  325 mg Oral Daily Eduard Clos, MD   325 mg at 08/06/11 1020  . enoxaparin (LOVENOX) injection 40 mg  40 mg Subcutaneous Q24H Eduard Clos, MD   40 mg at 08/05/11 2119  . furosemide (LASIX) tablet 40 mg  40 mg Oral Daily Nada Boozer, NP   40 mg at 08/06/11 1020  . lisinopril (PRINIVIL,ZESTRIL) tablet 10 mg  10 mg Oral Daily Nada Boozer, NP   10 mg at 08/06/11 1020  . metoprolol succinate (TOPROL-XL) 24 hr tablet 50 mg  50 mg Oral Daily Duke Salvia, MD   50 mg at 08/06/11 1020  . potassium chloride SA (K-DUR,KLOR-CON) CR tablet 20 mEq  20 mEq Oral Daily Nada Boozer, NP   20 mEq at 08/06/11 1020  . DISCONTD: metoprolol tartrate (LOPRESSOR) tablet 25 mg  25 mg Oral BID Nada Boozer, NP        PE: General appearance: alert, cooperative and no distress Lungs: clear to auscultation bilaterally Heart: regular rate and rhythm, S1, S2 normal, no murmur, click, rub or gallop Extremities: No LEE Pulses: 2+ and symmetric  Lab Results:   Basename 08/06/11 0630 08/05/11 0648 08/05/11 0026  WBC 9.0 7.5 10.3  HGB 16.5 14.7 15.2  HCT 48.9 43.6 45.7  PLT 157 145* 161   BMET  Basename 08/06/11 0630 08/05/11 0648 08/05/11 0026  NA 137 -- 136  K 4.2 -- 4.1  CL 101 -- 102  CO2 26 -- 24  GLUCOSE 98 -- 103*    BUN 21 -- 24*  CREATININE 1.33 1.45* 1.61*  CALCIUM 9.4 -- 9.1   PT/INR  Basename 08/05/11 0026  LABPROT 14.8  INR 1.14   Cholesterol  Basename 08/06/11 0630  CHOL 206*    Studies/Results: Study Conclusions  - Left ventricle: The cavity size was severely dilated. Wall thickness was normal. The estimated ejection fraction was = 20%. Diffuse hypokinesis. Doppler parameters are consistent with restrictive physiology, indicative of decreased left ventricular diastolic compliance and/or increased left atrial pressure. Doppler parameters are consistent with both elevated ventricular end-diastolic filling pressure and elevated left atrial filling pressure. Acoustic contrast opacification revealed no evidence ofthrombus. - Aortic valve: Trivial regurgitation. - Mitral valve: No regurgitation directed eccentrically and posteriorly. Moderate perivalvular regurgitation. The acceleration rate of the regurgitant jet was reduced, consistent with a low dP/dt. - Left atrium: The atrium was moderately to severely dilated. - Right ventricle: Systolic function was mildly reduced. - Atrial septum: No defect or patent foramen ovale was identified. - Pulmonary arteries: Systolic pressure was mildly to moderately increased. PA peak pressure: 43mm Hg (S).  Assessment/Plan  Principal Problem:  *TIA (transient ischemic attack) Active Problems:  NICM (nonischemic cardiomyopathy), 2011, normal cors at cath  VENTRICULAR TACHYCARDIA  Chronic systolic heart  failure  AICD (automatic cardioverter/defibrillator) present, [employer]tr[employer] placed 2011 Pulmonary HTN, Mild   Plan:  Contrast echo results above.  No thrombus.  EF 20%.  BB changed to Toprol XL per EP.  Still having short 5-6 beat runs of SVT/VT.  Likely need to titrate BB further.  BP 97/74 - 129/90.  HF consult pending.  SCr improved.    LOS: 2 days    HAGER,BRYAN W 08/06/2011 11:29 AM  I saw the patient this AM along with Dr.  Gala Romney, who had just spoken with Dr.Croitoru re: Plans. He is otherwise stable for discharge clinically -- will increase Toprol dose to 75mg  and add Digoxin 0.125mg  daily.  - next option would be Amiodarone. Hopefully the delayed release BB will be more tolerated.  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  He has a summation (S3 & S4 gallop) along with what is most likely "functional"  Mitral Regurgitation (due to dilation of the annulus).  I agree with Drs. Croitoru & Bensimhon re initiating workup for LVAD vs. Transplantation.   The plan is for CPX testing next week.  Has f/u with Dr. Royann Shivers set for 5/22 - but may move up pending CPX results.  Will notify TRH service.  Marykay Lex, M.D., M.S. [facility] [address] [place], [place]  [address]  [phone] Pager # [phone]  08/06/2011 4:23 PM

## 2011-08-06 NOTE — Progress Notes (Signed)
PT  Cancellation Note     Evaluation cancelled today due to no further PT needs, pt at baseline.  08/06/2011  Godwin Bing, PT [phone] (pager)

## 2011-08-06 NOTE — Progress Notes (Signed)
Clinical Social Work Department BRIEF PSYCHOSOCIAL ASSESSMENT 08/06/2011  Patient:  Robert [name], Robert [name]     Account Number:  [id number]     Admit date:  08/04/2011  Clinical Social Worker:  Dennison Bulla  Date/Time:  08/06/2011 04:30 PM  Referred by:  Physician  Date Referred:  08/06/2011 Referred for  Other - See comment   Other Referral:   Comm. resources   Interview type:  Patient Other interview type:    PSYCHOSOCIAL DATA Living Status:  ALONE Admitted from facility:   Level of care:   Primary support name:  Robert [name] Primary support relationship to patient:  PARENT Degree of support available:   Adequate    CURRENT CONCERNS Current Concerns  Financial Resources   Other Concerns:    SOCIAL WORK ASSESSMENT / PLAN CSW met with patient in room. Patient was alert and oriented. Patient reported he was not able to drive for 6 months and he works as a Hospital doctor. Patient reports he needs assistance with disability and resources. CSW provided patient with resources and provided emotional support. Patient reported mom can assist with transportation and support. CSW is signing off.   Assessment/plan status:  No Further Intervention Required Other assessment/ plan:   Information/referral to community resources:   Optometrist, department of social services, emergency resources    PATIENT'S/FAMILY'S RESPONSE TO PLAN OF CARE: Patient positive and appreciative of CSW consult

## 2011-08-06 NOTE — Progress Notes (Signed)
Around 0200, pt's BP  97/74, pulse 86. Pt complained of some nausea but denied any dizziness or feeling lightheaded. No weakness in arms or legs. No visual changes. Pt encouraged to drink fluids. Pt stated he feels this way after taking Metoprolol. Metoprolol was given earlier in day around 1540. Monitored pt's nausea. States he felt better after drinking fluids after 30 minutes. At 0315, BP 115/87, pulse 80. Will continue to monitor for recurrent drop in BP.  Salvadore Oxford, RN [phone]

## 2011-08-06 NOTE — Progress Notes (Signed)
Pt dc instructions provided. Pt verbalized understanding. Pt iv dc intact. Pt under no s/s distress. Prescriptions provided.

## 2011-08-06 NOTE — Discharge Summary (Signed)
Physician Discharge Summary  Patient ID: Robert [name] MRN: [id number] DOB/AGE: 56-May-1957 56 y.o.  Admit date: 08/04/2011 Discharge date: 08/06/2011  Admission Diagnoses:  Discharge Diagnoses:   *TIA (transient ischemic attack) Active Problems:  NICM (nonischemic cardiomyopathy), 2011, normal cors at cath  VENTRICULAR TACHYCARDIA  Chronic systolic heart failure  AICD (automatic cardioverter/defibrillator) present, Medtronic placed 2011  Acute exacerbation of CHF (congestive heart failure)   Discharged Condition: good  Hospital Course: 56 year old male with a history of hypertension, congestive heart failure and defibrillator placement for recurrent episodes of syncope and nonsustained ventricular tachycardia. The patient is treated primarily by [facility] heart and vascular, has a Medtronic defibrillator which was placed in 2011. He states that approximately 24 hours ago he had occasional palpitations and one episode of firing of his defibrillator. Since that time he has improved significantly but at 6:30 this evening he developed acute onset of right lower extremity weakness. 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 When he got home he felt like he was dragging his leg and some difficulty walking the stairs. He admits that around 7:30 in the evening his symptoms have completely resolved. He has no focal weakness numbness difficulty talking or change in vision at this time. He has no chest pain, no palpitations and otherwise is feeling okay. He wanted to get checked out because of the weakness of his right leg.  The patient was admitted into the hospital and seen by his cardiologist with [facility].  Interrogated the patient's ICD and he did reveal the patient had some the tachycardia.  The patient was diuresis to treated for CHF.  He was also started  on beta blockers.  He's going to be discharged home on 75 mg of metoprolol XL daily.  The patient tolerated in the hospital.  The patient has close followup or range with his cardiologist.  He was started on statin therapy as well for elevated LDL cholesterol of 125.  The patient had an echocardiogram to rule out blood clot in the heart intravenous to a TIA.  It was negative for blood clot.  The patient had complete resolution of the original symptoms that he presented with regarding the weakness in the extremities.  The patient was found to have an ejection fraction of 20% on the echocardiogram.  The patient was told not to drive an automobile or motor vehicle or operate heavy machinery for 6 months.  He cannot resume until cleared by cardiology.  The patient verbalized understanding.  The patient was also seen by the electrophysiologist cardiologist who recommended to beta blocker therapy and provided restrictions regarding driving.  The patient will be following up with cardiology for further care.  The patient was started on 325 mg of aspirin for antiplatelet therapy.  His blood pressures are very well controlled in the hospital.  He will have repeat blood pressure monitoring with his cardiologist next week.  He was started on digoxin prior to being discharged home.   Consults: [facility] cardiology, electrophysiology, physical therapy and occupational therapy  Significant Diagnostic Studies: Echocardiogram with results as mentioned above  Disposition:  home  Discharge Orders    Future Appointments: Provider: Department: Dept Phone: Center:   08/11/2011 1:30 PM [facility] [phone] [facility]     Future Orders Please Complete By Expires   Diet - low sodium heart  healthy      Increase activity slowly      Driving Restrictions      Comments:   Avoid driving and operating heavy machinery until cleared by your cardiologist to start back again.     Medication List  As of  08/06/2011  4:41 PM   STOP taking these medications         aspirin EC 81 MG tablet      LISINOPRIL PO      metoprolol 50 MG tablet      PRESCRIPTION MEDICATION         TAKE these medications         aspirin 325 MG tablet   Take 1 tablet (325 mg total) by mouth daily.      co-enzyme Q-10 30 MG capsule   Take 30 mg by mouth 3 (three) times daily.      digoxin 0.125 MG tablet   Commonly known as: LANOXIN   Take 1 tablet (0.125 mg total) by mouth daily.      furosemide 40 MG tablet   Commonly known as: LASIX   Take 1 tablet (40 mg total) by mouth daily.      lisinopril 10 MG tablet   Commonly known as: PRINIVIL,ZESTRIL   Take 1 tablet (10 mg total) by mouth daily.      metoprolol succinate 25 MG 24 hr tablet   Commonly known as: TOPROL-XL   Take 3 tablets (75 mg total) by mouth daily.      omega-3 acid ethyl esters 1 G capsule   Commonly known as: LOVAZA   Take 2 g by mouth 2 (two) times daily.      potassium chloride SA 20 MEQ tablet   Commonly known as: K-DUR,KLOR-CON   Take 1 tablet (20 mEq total) by mouth daily.      simvastatin 10 MG tablet   Commonly known as: ZOCOR   Take 2 tablets (20 mg total) by mouth daily at 6 PM.           Follow-up Information    Follow up with Thurmon Fair, MD on 08/25/2011. (at 1:30 pm)    Contact information:   [address] [place] [address] [phone]       Follow up with Cardiopulmonary exercise test on 08/11/2011. (1:30p   Gate Code [id number])    Contact information:   [facility] [address] [place], [place] [address] [phone]      Follow up with Arvilla Meres, MD on 08/11/2011. (3p   )    Contact information:   [address] [place] [address] [phone]         I spent 38 minutes preparing this patient's discharge including reviewing consultation notes, labs, records, writing prescriptions and dictating a discharge  summary.  Signed: Tyronza Happe 08/06/2011, 4:41 PM

## 2011-08-06 NOTE — Consult Note (Signed)
Advanced Heart Failure Team Consult Note  Referring Physician: Dr. Royann Shivers  Primary Cardiologist: Dr. Royann Shivers  Reason for Consultation:  Evaluation for advanced therapies  HPI:   Robert [name] is a 56 year old male with a history of HTN felt to be cause of non ischemic cardiomyopathy (EF: 20%), status post dual chamber Medtronic ICD implanted April 2011. Cath 2011 showed normal coronaries.  Presented to the ER 08/05/11 for complaints of weakness in R lower extremity and AICD firing on 08/04/11. CT of the head was negative and physical examination did not reveal any focal deficits. ICD interrogated and showed VT with appropriate therapy. Several episodes of NSVT as well as episodes terminated by ATP. Dr. Graciela Husbands with EP evaluated patient and started Toprol, since patient had complained of increased fatigue on lopressor and discontinued himself a couple of weeks ago.  ECHO 08/05/11: LVEF 20% with diffuse hypokinesis with restrictive physiology. LV severely dilated LVIDd = 8.2 cm (up from 7.8cm in 2011)  Moderate perivalvular mitral regurgitation. LA moderately to severely dilated. RV with mildly reduced function; PAPP  Carotid Doppler 08/05/11: No significant extracranial carotid artery stenosis  HF team has been asked to evaluate the patient for advanced therapies. Over the last couple of months patient has noticed increased weakness/fatigue that he contributed to lopressor. Patient tried to cut dose in half at home to see if he would have any improvement, however no improvement so he discontinued on his own. Last couple of months notable for good and bad days where his dyspnea is more of an issue sometimes requiring sleeping in a recliner. Despite his dyspnea patient is able to complete his daily activities although it takes him longer. No lower extremity edema. Patient denies episodes of dizziness or syncope.   Reports being compliant with all medications and never smoking, no ETOH use, or drug use.  Patient weighs himself at home, but not daily and reports remaining around 225 lbs. Does not use sliding scale diuretics.   Review of Systems: [y] = yes, [ ]  = no   General: Weight gain [ ] ; Weight loss [ ] ; Anorexia [ ] ; Fatigue [ ] ; Fever [ ] ; Chills [ ] ; Weakness Cove.Etienne ]  Cardiac: Chest pain/pressure [ ] ; Resting SOB [ ] ; Exertional SOB Cove.Etienne ]; Orthopnea Cove.Etienne ]; Pedal Edema [ ] ; Palpitations [ ] ; Syncope [ ] ; Presyncope [ ] ; Paroxysmal nocturnal dyspnea[ ]   Pulmonary: Cough [ ] ; Wheezing[ ] ; Hemoptysis[ ] ; Sputum [ ] ; Snoring [ ]   GI: Vomiting[ ] ; Dysphagia[ ] ; Melena[ ] ; Hematochezia [ ] ; Heartburn[ ] ; Abdominal pain [ ] ; Constipation [ ] ; Diarrhea [ ] ; BRBPR [ ]   GU: Hematuria[ ] ; Dysuria [ ] ; Nocturia[ ]   Vascular: Pain in legs with walking [ ] ; Pain in feet with lying flat [ ] ; Non-healing sores [ ] ; Stroke [ ] ; TIA [ ] ; Slurred speech [ ] ;  Neuro: Headaches[ ] ; Vertigo[ ] ; Seizures[ ] ; Paresthesias[ ] ;Blurred vision [ ] ; Diplopia [ ] ; Vision changes [ ]   Ortho/Skin: Arthritis [ ] ; Joint pain [ ] ; Muscle pain [ ] ; Joint swelling [ ] ; Back Pain [ ] ; Rash [ ]   Psych: Depression[ ] ; Anxiety[ ]   Heme: Bleeding problems [ ] ; Clotting disorders [ ] ; Anemia [ ]   Endocrine: Diabetes [ ] ; Thyroid dysfunction[ ]   Home Medications Prior to Admission medications   Medication Sig Start Date End Date Taking? Authorizing Provider  aspirin EC 81 MG tablet Take 81 mg by mouth daily.   Yes Historical Provider, MD  co-enzyme Q-10 30 MG capsule Take 30 mg by mouth 3 (three) times daily.   Yes Historical Provider, MD  lisinopril (PRINIVIL,ZESTRIL) 20 MG tablet Take 20 mg by mouth daily.   Yes Historical Provider, MD  metoprolol (LOPRESSOR) 50 MG tablet Take 50 mg by mouth 2 (two) times daily.   Yes Historical Provider, MD  omega-3 acid ethyl esters (LOVAZA) 1 G capsule Take 2 g by mouth 2 (two) times daily.   Yes Historical Provider, MD    Past Medical History: Past Medical History  Diagnosis Date  . AICD  (automatic cardioverter/defibrillator) present   . Hypertension   . Congestive heart failure   . Pneumonia   . Bronchitis     Past Surgical History: Past Surgical History  Procedure Date  . Cardiac defibrillator placement   . Cardiac catheterization     Family History: Denies any family history of CAD, HF, diabetes, or SCD. Mother and father still living and believes they are on bp medications.  Social History: History   Social History  . Marital Status: Single    Spouse Name: N/A    Number of Children: N/A  . Years of Education: N/A   Social History Main Topics  . Smoking status: Never Smoker   . Smokeless tobacco: None  . Alcohol Use: No  . Drug Use: No  . Sexually Active:    Other Topics Concern  . None   Social History Narrative  . 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  Allergies:  No Known Allergies  Objective:    Vital Signs:   Temp:  [96.4 F (35.8 C)-97.9 F (36.6 C)] 97.6 F (36.4 C) (05/03 1000) Pulse Rate:  [69-97] 83  (05/03 1000) Resp:  [18-24] 18  (05/03 1000) BP: (97-129)/(74-90) 116/87 mmHg (05/03 1000) SpO2:  [92 %-99 %] 92 % (05/03 1000) Last BM Date: 08/06/11  Weight change: Filed Weights   08/05/11 0539  Weight: 101.7 kg (224 lb 3.3 oz)    Intake/Output:  No intake or output data in the 24 hours ending 08/06/11 1052   Physical Exam: General:  Well appearing. No resp difficulty HEENT: normal Neck: supple. JVP 7-8 Carotids 2+ bilat; no bruits. No lymphadenopathy or thryomegaly appreciated. Cor: PMI laterally displaced. Regular rate & rhythm. 3/6 MR. +s3 Lungs: clear Abdomen: soft, nontender, nondistended. No hepatosplenomegaly. No bruits or masses. Good bowel sounds. Extremities: no cyanosis, clubbing, rash, edema Neuro: alert & orientedx3, cranial nerves grossly intact. moves all 4 extremities w/o difficulty. Affect pleasant  Telemetry: NSR with runs NSVT.   Labs: Basic  Metabolic Panel:  Lab 08/06/11 1610 08/05/11 0648 08/05/11 0026  NA 137 -- 136  K 4.2 -- 4.1  CL 101 -- 102  CO2 26 -- 24  GLUCOSE 98 -- 103*  BUN 21 -- 24*  CREATININE 1.33 1.45* 1.61*  CALCIUM 9.4 -- 9.1  MG -- -- --  PHOS -- -- --    Liver Function Tests:  Lab 08/06/11 0630 08/05/11 0026  AST 31 52*  ALT 55* 55*  ALKPHOS 63 63  BILITOT 1.7* 0.9  PROT 6.9 6.3  ALBUMIN 3.8 3.6   No results found for this basename: LIPASE:5,AMYLASE:5 in the last 168 hours No results found for this basename: AMMONIA:3 in the last 168 hours  CBC:  Lab 08/06/11 0630 08/05/11 0648 08/05/11 0026  WBC 9.0 7.5 10.3  NEUTROABS -- -- 6.8  HGB 16.5 14.7 15.2  HCT 48.9 43.6 45.7  MCV 87.9 87.4 87.9  PLT 157 145* 161    Cardiac Enzymes:  Lab 08/05/11 0027  CKTOTAL 164  CKMB 4.8*  CKMBINDEX --  TROPONINI <0.30    BNP: BNP (last 3 results)  Basename 08/05/11 0648  PROBNP 2992.0*    CBG: No results found for this basename: GLUCAP:5 in the last 168 hours  Coagulation Studies:  Basename 08/05/11 0026  LABPROT 14.8  INR 1.14    Other results: EKG: NSR 87 with LVH and repol   Imaging: Dg Chest 2 View  08/05/2011  *RADIOLOGY REPORT*  Clinical Data: Shortness of breath  CHEST - 2 VIEW  Comparison: 07/22/2009  Findings: Cardiomegaly.  Left chest wall battery pack with dual leads, projecting over the right atrium and right ventricle.  No focal consolidation or overt edema.  No pneumothorax or pleural effusion.  Multilevel degenerative changes.  No acute osseous change.  IMPRESSION: Cardiomegaly without focal consolidation or overt edema.  Original Report Authenticated By: Waneta Martins, M.D.   Ct Head Wo Contrast  08/05/2011  *RADIOLOGY REPORT*  Clinical Data: Right leg weakness and pain  CT HEAD WITHOUT CONTRAST  Technique:  Contiguous axial images were obtained from the base of the skull through the vertex without contrast.  Comparison: 07/16/2009  Findings: Wallace Cullens white  differentiation is maintained.  No CT evidence of acute large territory infarct. Basal ganglial calcifications. No intraparenchymal or extra-axial mass or hemorrhage.  Normal size and configuration of the ventricles and basilar cisterns.  No midline shift.  Vascular calcifications.  Limited visualization of the paranasal sinuses mastoid air cells is normal.  Regional soft tissues are normal.  No displaced calvarial fracture.  IMPRESSION: Negative noncontrast head CT.  Original Report Authenticated By: Waynard Reeds, M.D.      Medications:     Current Medications:    . sodium chloride   Intravenous STAT  . aspirin  325 mg Oral Daily  . enoxaparin  40 mg Subcutaneous Q24H  . furosemide  40 mg Oral Daily  . lisinopril  10 mg Oral Daily  . metoprolol succinate  50 mg Oral Daily  . potassium chloride  20 mEq Oral Daily  . DISCONTD: metoprolol  25 mg Oral BID     Infusions:      Assessment:  1. Chronic Systolic Heart Failure 2. NICM, EF 20% with severely dilated LV  3. VT, status post appropriate ICD therapies 4. HTN  Plan/Discussion:   Robert [name] presented with weakness and post AICD shock, d/t VT. He continues to have a depressed EF per ECHO this admission. Functionally patient appears NYHA II and euvolemic on exam. At this time we would recommend optimizing medical therapies. Agree with Lisinopril and Toprolol. Would add Cleda Daub.   After optimization of therapy if EF does not improve or symptoms worsen would consider working patient up for possible transplant/VAD. More than willing to follow patient during this time in the HF clinic. Thank you for this consult.  Length of Stay: 2  [name] 08/06/2011, 10:52 AM  Patient seen and examined with Ulla Potash, NP. We discussed all aspects of the encounter. I agree with the assessment and plan as stated above.   Robert [name] is a delightful 56 y/o with a severe NICM with LVIDd 8.0 cm admitted with VT and ICD shock. Recently  intolerant of b-blockers due to severe fatigue. He reports NYHA II symptoms but I suspect he is worse given his severely dilated  LV, intolerance of b-blockers and S3 gallop on exam. Agree with addition of Toprol for VT.  I also agree with Dr. Royann Shivers that he is likely nearing point where he would benefit from advanced therapies (transplant, LVAD, etc). Will schedule CPX testing for next week to begin work-up. Suspect he will also need RHC soon with Dr. Salena Saner. Will add digoxin 0.125mg  daily. Would have low threshold to add amio. Given the fact that he is not able to drive and has advanced HF will consult SW to begin applying for disability.   Will have him f/u in HF clinic as well. D/w Dr. Herbie Baltimore.  Stable for d/c today with close f/u.  Taylar Hartsough,MD 4:14 PM

## 2011-08-09 ENCOUNTER — Other Ambulatory Visit (HOSPITAL_COMMUNITY): Payer: Self-pay | Admitting: Internal Medicine

## 2011-08-09 DIAGNOSIS — I5022 Chronic systolic (congestive) heart failure: Secondary | ICD-10-CM

## 2011-08-09 MED FILL — Perflutren Lipid Microsphere IV Susp 6.52 MG/ML: INTRAVENOUS | Qty: 2 | Status: AC

## 2011-08-11 ENCOUNTER — Ambulatory Visit (HOSPITAL_COMMUNITY)
Admission: RE | Admit: 2011-08-11 | Discharge: 2011-08-11 | Disposition: A | Discharge status: Home / Self Care | Payer: PRIVATE HEALTH INSURANCE | Source: Ambulatory Visit | Attending: Internal Medicine | Admitting: Internal Medicine

## 2011-08-11 ENCOUNTER — Ambulatory Visit (HOSPITAL_COMMUNITY): Payer: PRIVATE HEALTH INSURANCE

## 2011-08-11 VITALS — BP 106/62 | HR 59 | Wt 219.8 lb

## 2011-08-11 DIAGNOSIS — Z95810 Presence of automatic (implantable) cardiac defibrillator: Secondary | ICD-10-CM | POA: Insufficient documentation

## 2011-08-11 DIAGNOSIS — I5022 Chronic systolic (congestive) heart failure: Secondary | ICD-10-CM

## 2011-08-11 DIAGNOSIS — Z7982 Long term (current) use of aspirin: Secondary | ICD-10-CM | POA: Insufficient documentation

## 2011-08-11 DIAGNOSIS — R0601 Orthopnea: Secondary | ICD-10-CM

## 2011-08-11 DIAGNOSIS — Z79899 Other long term (current) drug therapy: Secondary | ICD-10-CM | POA: Insufficient documentation

## 2011-08-11 DIAGNOSIS — I10 Essential (primary) hypertension: Secondary | ICD-10-CM | POA: Insufficient documentation

## 2011-08-11 MED ORDER — LISINOPRIL 10 MG PO TABS: 10.0000 mg | ORAL_TABLET | Freq: Every day | ORAL | Status: DC

## 2011-08-11 MED ORDER — FUROSEMIDE 40 MG PO TABS: 40.0000 mg | ORAL_TABLET | Freq: Every day | ORAL | Status: DC

## 2011-08-11 MED ORDER — DIGOXIN 125 MCG PO TABS: 0.1250 mg | ORAL_TABLET | Freq: Every day | ORAL | Status: DC

## 2011-08-11 MED ORDER — METOPROLOL SUCCINATE ER 25 MG PO TB24: 75.0000 mg | ORAL_TABLET | Freq: Every day | ORAL | Status: DC

## 2011-08-11 NOTE — Assessment & Plan Note (Signed)
Overall NYHA III. CPX testing with mod to severe cardiac limitation with very high slope and pVO2 just slightly over 50% predicted. I had a long talk with him and his family discussing his situation and saying that I felt he was nearing the point where he would need advanced therapies. They are eager to proceed with this work-up. I have discussed with Dr. Rubie Maid who will perform R and L cath in the next week. Will follow closely in HF clinic.

## 2011-08-11 NOTE — Progress Notes (Deleted)
Patient ID: Robert [name], male   DOB: 01-Nov-1955, 56 y.o.   MRN: [id number]

## 2011-08-11 NOTE — Progress Notes (Addendum)
Primary Cardiologist: Dr. Royann Shivers  HPI: 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 Cath 2011 showed normal coronaries.  ECHO 08/05/11:  LVEF 20% with diffuse hypokinesis with restrictive physiology. LV severely dilated LVIDd = 8.2 cm (up from 7.8cm in 2011) Moderate perivalvular mitral regurgitation. LA moderately to severely dilated. RV with mildly reduced function; PAPP .   Carotid Doppler 08/05/11: No significant extracranial carotid artery stenosis  He is here for post hospital follow up.  He had his CPX and feels tired.  He has felt pretty good since discharge but notes energy level is not what it used to be. Has to walk slowly when walking any distance. No further ICD shocks. Weighing himself daily, stable.  No further shocks.   No SOB/orthopnea/PND.  No edema.  Compliant with meds but has not picked up his Toprol so is still taking his lopressor.   CPX 08/11/11: Preliminary  FVC 3.02 (65%) FEV1 2.43 (66%) FEV1/FVC 80% MVV 150 (95%) Peak VO2 17.3 ml/kg/min (57.3% predicted) VE/VCO2 slope 48.3,  pRER 1.08, Vent threshold 13.4% (predicted 44.4%) VE/MVV 61.7% - EKG with frequent PVCs during exercise      ROS: All systems negative except as listed in HPI, PMH and Problem List.  Past Medical History  Diagnosis Date  . AICD (automatic cardioverter/defibrillator) present   . Hypertension   . Congestive heart failure   . Pneumonia   . Bronchitis     Current Outpatient Prescriptions  Medication Sig Dispense Refill  . aspirin 325 MG tablet Take 1 tablet (325 mg total) by mouth daily.  30 tablet  0  . co-enzyme Q-10 30 MG capsule Take 30 mg by mouth 3 (three) times daily.      . digoxin (LANOXIN) 0.125 MG tablet Take 1 tablet (0.125 mg total) by mouth daily.  30 tablet  0  . furosemide (LASIX) 40 MG tablet Take 1 tablet (40 mg total) by mouth  daily.  30 tablet  0  . lisinopril (PRINIVIL,ZESTRIL) 10 MG tablet Take 1 tablet (10 mg total) by mouth daily.  30 tablet  0  . metoprolol succinate (TOPROL-XL) 25 MG 24 hr tablet Take 3 tablets (75 mg total) by mouth daily.  90 tablet  0  . omega-3 acid ethyl esters (LOVAZA) 1 G capsule Take 2 g by mouth 2 (two) times daily.      . potassium chloride SA (K-DUR,KLOR-CON) 20 MEQ tablet Take 1 tablet (20 mEq total) by mouth daily.  30 tablet  0  . simvastatin (ZOCOR) 20 MG tablet Take 1 tablet (20 mg total) by mouth at bedtime.  30 tablet  0     PHYSICAL EXAM: Filed Vitals:   08/11/11 1523  BP: 106/62  Pulse: 59  Weight: 219 lb 12 oz (99.678 kg)  SpO2: 97%    General: Well appearing. No resp difficulty  HEENT: normal  Neck: supple. JVP 5-6 Carotids 2+ bilat; no bruits. No lymphadenopathy or thryomegaly appreciated.  Cor: PMI laterally displaced. Regular rate & rhythm. 3/6 MR. +s3  Lungs: clear  Abdomen: soft, nontender, nondistended. No hepatosplenomegaly. No bruits or masses. Good bowel sounds.  Extremities: no cyanosis, clubbing, rash, edema  Neuro: alert & orientedx3, cranial nerves grossly intact. moves all 4 extremities w/o difficulty. Affect pleasant    ASSESSMENT & PLAN:

## 2011-08-11 NOTE — Progress Notes (Signed)
Encounter addended by: Dolores Patty, MD on: 08/11/2011  5:18 PM<BR>     Documentation filed: Inpatient Notes, Notes Section

## 2011-08-11 NOTE — Patient Instructions (Signed)
Will set you up for a Left and Right heart catheterization with Dr. Salena Saner.  Your physician has requested that you have a cardiac catheterization. Cardiac catheterization is used to diagnose and/or treat various heart conditions. Doctors may recommend this procedure for a number of different reasons. The most common reason is to evaluate chest pain. Chest pain can be a symptom of coronary artery disease (CAD), and cardiac catheterization can show whether plaque is narrowing or blocking your heart's arteries. This procedure is also used to evaluate the valves, as well as measure the blood flow and oxygen levels in different parts of your heart. For further information please visit [email or web address] Please follow instruction sheet, as given.  Follow up with Dr. Gala Romney in 3 weeks.

## 2011-08-12 ENCOUNTER — Other Ambulatory Visit: Payer: Self-pay | Admitting: Cardiovascular Disease

## 2011-08-12 DIAGNOSIS — Z01811 Encounter for preprocedural respiratory examination: Secondary | ICD-10-CM

## 2011-08-13 ENCOUNTER — Encounter (HOSPITAL_COMMUNITY): Payer: Self-pay | Admitting: Pharmacy Technician

## 2011-08-17 ENCOUNTER — Encounter (HOSPITAL_COMMUNITY)
Admission: RE | Disposition: A | Discharge status: Home / Self Care | Payer: Self-pay | Source: Ambulatory Visit | Attending: Cardiovascular Disease

## 2011-08-17 ENCOUNTER — Ambulatory Visit (HOSPITAL_COMMUNITY)
Admission: RE | Admit: 2011-08-17 | Discharge: 2011-08-17 | Disposition: A | Discharge status: Home / Self Care | Payer: PRIVATE HEALTH INSURANCE | Source: Ambulatory Visit | Attending: Cardiovascular Disease | Admitting: Cardiovascular Disease

## 2011-08-17 DIAGNOSIS — I5022 Chronic systolic (congestive) heart failure: Secondary | ICD-10-CM | POA: Diagnosis present

## 2011-08-17 DIAGNOSIS — I272 Pulmonary hypertension, unspecified: Secondary | ICD-10-CM | POA: Diagnosis present

## 2011-08-17 DIAGNOSIS — I428 Other cardiomyopathies: Secondary | ICD-10-CM | POA: Diagnosis present

## 2011-08-17 DIAGNOSIS — I10 Essential (primary) hypertension: Secondary | ICD-10-CM | POA: Diagnosis present

## 2011-08-17 DIAGNOSIS — Z95810 Presence of automatic (implantable) cardiac defibrillator: Secondary | ICD-10-CM | POA: Insufficient documentation

## 2011-08-17 DIAGNOSIS — I2789 Other specified pulmonary heart diseases: Secondary | ICD-10-CM | POA: Insufficient documentation

## 2011-08-17 DIAGNOSIS — I472 Ventricular tachycardia: Secondary | ICD-10-CM | POA: Diagnosis present

## 2011-08-17 DIAGNOSIS — Z01811 Encounter for preprocedural respiratory examination: Secondary | ICD-10-CM

## 2011-08-17 HISTORY — PX: LEFT AND RIGHT HEART CATHETERIZATION WITH CORONARY ANGIOGRAM: SHX5449

## 2011-08-17 LAB — POCT I-STAT 3, ART BLOOD GAS (G3+)
Acid-base deficit: 1 mmol/L (ref 0.0–2.0)
pH, Arterial: 7.478 — ABNORMAL HIGH (ref 7.350–7.450)

## 2011-08-17 LAB — POCT I-STAT 3, VENOUS BLOOD GAS (G3P V)
Acid-Base Excess: 1 mmol/L (ref 0.0–2.0)
Bicarbonate: 26.6 mEq/L — ABNORMAL HIGH (ref 20.0–24.0)
Bicarbonate: 25.1 mEq/L — ABNORMAL HIGH (ref 20.0–24.0)
Patient temperature: 98.7
pCO2, Ven: 42.1 mmHg — ABNORMAL LOW (ref 45.0–50.0)
pH, Ven: 7.39 — ABNORMAL HIGH (ref 7.250–7.300)
pO2, Ven: 29 mmHg — CL (ref 30.0–45.0)
pO2, Ven: 30 mmHg (ref 30.0–45.0)

## 2011-08-17 SURGERY — LEFT AND RIGHT HEART CATHETERIZATION WITH CORONARY ANGIOGRAM
Anesthesia: LOCAL

## 2011-08-17 MED ORDER — ACETAMINOPHEN 325 MG PO TABS: 650.0000 mg | ORAL_TABLET | ORAL | Status: DC | PRN

## 2011-08-17 MED ORDER — ASPIRIN 81 MG PO CHEW: 324.0000 mg | CHEWABLE_TABLET | ORAL | Status: DC

## 2011-08-17 MED ORDER — FUROSEMIDE 10 MG/ML IJ SOLN
INTRAMUSCULAR | Status: AC
  Filled 2011-08-17: qty 4

## 2011-08-17 MED ORDER — LIDOCAINE HCL (PF) 1 % IJ SOLN
INTRAMUSCULAR | Status: AC
  Filled 2011-08-17: qty 30

## 2011-08-17 MED ORDER — SODIUM CHLORIDE 0.9 % IJ SOLN: 3.0000 mL | INTRAMUSCULAR | Status: DC | PRN

## 2011-08-17 MED ORDER — FENTANYL CITRATE 0.05 MG/ML IJ SOLN
INTRAMUSCULAR | Status: AC
  Filled 2011-08-17: qty 2

## 2011-08-17 MED ORDER — ONDANSETRON HCL 4 MG/2ML IJ SOLN: 4.0000 mg | Freq: Four times a day (QID) | INTRAMUSCULAR | Status: DC | PRN

## 2011-08-17 MED ORDER — DIAZEPAM 5 MG PO TABS
5.0000 mg | ORAL_TABLET | ORAL | Status: AC
  Administered 2011-08-17: 5 mg via ORAL
  Filled 2011-08-17: qty 1

## 2011-08-17 MED ORDER — SODIUM CHLORIDE 0.45 % IV SOLN: INTRAVENOUS | Status: DC

## 2011-08-17 MED ORDER — SODIUM CHLORIDE 0.9 % IV SOLN: 250.0000 mL | INTRAVENOUS | Status: DC | PRN

## 2011-08-17 MED ORDER — MIDAZOLAM HCL 2 MG/2ML IJ SOLN
INTRAMUSCULAR | Status: AC
  Filled 2011-08-17: qty 2

## 2011-08-17 MED ORDER — SODIUM CHLORIDE 0.9 % IV SOLN
INTRAVENOUS | Status: DC
  Administered 2011-08-17: 1000 mL via INTRAVENOUS

## 2011-08-17 MED ORDER — SODIUM CHLORIDE 0.9 % IV SOLN
1.0000 mL/kg/h | INTRAVENOUS | Status: AC
  Administered 2011-08-17: 1 mL/kg/h via INTRAVENOUS

## 2011-08-17 MED ORDER — NITROGLYCERIN 0.2 MG/ML ON CALL CATH LAB
INTRAVENOUS | Status: AC
  Filled 2011-08-17: qty 1

## 2011-08-17 MED ORDER — HEPARIN (PORCINE) IN NACL 2-0.9 UNIT/ML-% IJ SOLN
INTRAMUSCULAR | Status: AC
  Filled 2011-08-17: qty 1000

## 2011-08-17 NOTE — Discharge Instructions (Signed)

## 2011-08-17 NOTE — H&P (Signed)
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 Cath then showed Nl cors. He had a MDT ICD placed at that time. He was recently admitted 08/04/11 with CHF exacerbation and ICD discharge. Echo showed an EF of 20-25% with mild to moderate pulmonary HTN.  His medications were adjusted and he is admitted now for elective Rt and Lt heart cath. Since discharge he has been stable, no ICD firings and he is tolerating his new dose of beta blocker. He is chronically SOB.   Problem List: Past Medical History  Diagnosis Date  . AICD (automatic cardioverter/defibrillator) present   . Hypertension   . Congestive heart failure   . Pneumonia   . Bronchitis     Past Surgical History  Procedure Date  . Cardiac defibrillator placement   . Cardiac catheterization      Allergies: No Known Allergies   Home Medications Prescriptions prior to admission  Medication Sig Dispense Refill  . aspirin 325 MG tablet Take 325 mg by mouth daily.      Marland Kitchen co-enzyme Q-10 30 MG capsule Take 30 mg by mouth 3 (three) times daily.      . digoxin (LANOXIN) 0.125 MG tablet Take 0.125 mg by mouth daily.      . furosemide (LASIX) 40 MG tablet Take 40 mg by mouth daily.      Marland Kitchen lisinopril (PRINIVIL,ZESTRIL) 10 MG tablet Take 10 mg by mouth daily.      . metoprolol succinate (TOPROL-XL) 25 MG 24 hr tablet Take 75 mg by mouth daily.      Marland Kitchen omega-3 acid ethyl esters (LOVAZA) 1 G capsule Take 2 g by mouth 2 (two) times daily.         No family history on file.   History   Social History  . Marital Status: Single    Spouse Name: N/A    Number of Children: N/A  . Years of Education: N/A   Occupational History  . Not on file.   Social History Main Topics  . Smoking status: Never  Smoker   . Smokeless tobacco: Not on file  . Alcohol Use: No  . Drug Use: No  . Sexually Active:    Other Topics Concern  . Not on file   Social History Narrative  . No narrative on file     Review of Systems: General: negative for chills, fever, night sweats or weight changes.  Cardiovascular:as per HPI, he also has palpitations Dermatological: negative for rash Respiratory: chronic DOE and mild orthopnea Urologic: negative for hematuria Abdominal: negative for nausea, vomiting, diarrhea, bright red blood per rectum, melena, or hematemesis Neurologic: negative for visual changes, syncope, or dizziness All other systems reviewed and are otherwise negative except as noted above.  Physical Exam: There were no vitals taken for this visit.  General appearance: alert, cooperative and no distress Neck: no carotid bruit, no JVD, supple, symmetrical, trachea midline and thyroid not enlarged, symmetric, no tenderness/mass/nodules Lungs: few crackles Rt base Heart: regular rate and rhythm and 2/6 MR murmur Abdomen: soft, non-tender; bowel sounds normal; no masses,  no organomegaly Extremities: extremities normal, atraumatic, no cyanosis or edema Pulses: 2+ and symmetric Skin: cool and dry Neurologic: Grossly normal    Labs:  No results found for this or  any previous visit (from the past 24 hour(s)).    EKG: NSR with septal Qs, LVH by volts and NSST changes  ASSESSMENT AND PLAN:  Active Problems:  Pulmonary hypertension, mild to moderate by 2D 5/13  NICM (EF 20-25% 2D 08/05/11),  normal cors at cath 4/11  VENTRICULAR TACHYCARDIA, ICD dischanrge 08/04/11, beta blocker added  Chronic systolic heart failure, recent exacerbation 08/05/11  AICD (automatic cardioverter/defibrillator) present, Medtronic placed 2011  Essential hypertension, benign  Plan- Rt and Lt heart cath today  Deland Pretty, PA-C 08/17/2011, 8:44 AM

## 2011-08-17 NOTE — CV Procedure (Signed)
CARDIAC CATHETERIZATION REPORT    Robert [name], [name], 57 y.o., 30-Jun-1955 MRN: [id number]  CSN: [id number]  Procedures performed:  1. Right and left heart catheterization  2. Selective coronary angiography     Reason for procedure:  Congestive heart failure  Procedure performed by: Thurmon Fair, MD, [facility]  Complications: none   Estimated blood loss: less than 5 mL   History:  56 year old with worsening heart failure (class IIIB, stage D)  Consent: The risks, benefits, and details of the procedure were explained to the patient. Risks including death, MI, stroke, bleeding, limb ischemia, renal failure and allergy were described and accepted by the patient. Informed written consent was obtained prior to proceeding.  Technique: The patient was brought to the cardiac catheterization laboratory in the fasting state. He was prepped and draped in the usual sterile fashion. Local anesthesia with 1% lidocaine was administered to the right groin area. Using the modified Seldinger technique a 5 French right common femoral artery sheath and a 7 French right common femoral vein sheath were introduced without difficulty. Using fluoroscopy, the balloon tipped PA catheter was advanced to the main pulmonary artery and the wedge position. Reaching the wedge position was difficult due to severe cardiomegaly. Under fluoroscopic guidance, using 5 [place] JL4, JR and angled pigtail catheters, selective cannulation of the left coronary artery, right coronary artery and left ventricle were respectively performed. Several coronary angiograms in a variety of projections were recorded. Left ventricular pressure and a pull back to the aorta were recorded. No immediate complications occurred. At the end of the procedure, all catheters were removed. After the procedure, hemostasis will be achieved with manual pressure.  Contrast used: 30 mL Omnipaque  Angiographic Findings:  1. The left main coronary artery is free  of significant atherosclerosis and bifurcates in the usual fashion into the left anterior descending artery and left circumflex coronary artery.  2. The left anterior descending artery is a large vessel that reaches the apex and generates thre diagonal branches, with the proximal diagonal being the only large branch. There is evidence of minimal luminal irregularities and no calcification. No hemodynamically meaningful stenoses are seen. 3. The left circumflex coronary artery is a medium-size vessel non dominant vessel that generates only one major oblique marginal arter that bifurcates into two large branches. There is evidence of minimal luminal irregularities and no calcification. No hemodynamically meaningful stenoses are seen. 4. The right coronary artery is a large-size dominant vessel that generates a a posterior lateral ventricular system as well as the PDA. There is evidence of minimal luminal irregularities and no calcification. No hemodynamically meaningful stenoses are seen.  5. The left ventricle was not injected. There is no aortic valve stenosis by pullback. The left ventricular end-diastolic pressure is 29 mm Hg.   Hemodynamic findings:  Aortic pressure 93/79 (mean 85) mm Hg  Left ventricle 91/16 mm Hg with end-diastolic pressure of 29 mm Hg  Pulmonary artery wedge pressure a wave 37, v wave 37,  mean 35 mm Hg  Pulmonary artery 50/30 (mean 39) mm Hg  Right ventricle 49/9  with an end-diastolic pressure of 12 mm Hg  Right atrium a wave 14, v wave 10 mean 9 mm Hg  Cardiac output is 3.27 L per minute (cardiac index 1.45 L per minute per meter sq) using the Fick equation.  PA oxygen saturation 53% AO oxygen saturation 97% RA oxygen saturation 56%  PVR 79 DSC (PVRI 178) SVR 1837 DSC (SVRI 4151)    IMPRESSIONS:  Hypervolemic - acute on chronic systolic and diastolic heart failure. Predominantly left heart failure due to severe nonischemic cardiomyopathy. Severely elevated  left heart filling pressures, moderately elevated right heart pressures and mild pulmonary arterial hypertension, entirely explained by left heart failure. Severely reduced cardiac output.  RECOMMENDATION:  Diuretics given today. Continue evaluation for advance heart failure therapies (LVAD, transplant) Try to boost vasodilators slowly (in past efforts hampered by low SBP, dizziness)    Thurmon Fair, MD, [facility] [phone] office [phone] pager 08/17/2011 12:07 PM

## 2011-09-01 ENCOUNTER — Ambulatory Visit (HOSPITAL_COMMUNITY)
Admission: RE | Admit: 2011-09-01 | Discharge: 2011-09-01 | Disposition: A | Discharge status: Home / Self Care | Payer: PRIVATE HEALTH INSURANCE | Source: Ambulatory Visit | Attending: Internal Medicine | Admitting: Internal Medicine

## 2011-09-01 VITALS — HR 90 | Wt 220.2 lb

## 2011-09-01 DIAGNOSIS — I5022 Chronic systolic (congestive) heart failure: Secondary | ICD-10-CM | POA: Insufficient documentation

## 2011-09-01 LAB — COMPREHENSIVE METABOLIC PANEL
ALT: 27 U/L (ref 0–53)
AST: 19 U/L (ref 0–37)
Albumin: 4.2 g/dL (ref 3.5–5.2)
Alkaline Phosphatase: 64 U/L (ref 39–117)
Chloride: 100 mEq/L (ref 96–112)
Creatinine, Ser: 1.43 mg/dL — ABNORMAL HIGH (ref 0.50–1.35)
Potassium: 4.2 mEq/L (ref 3.5–5.1)
Sodium: 140 mEq/L (ref 135–145)
Total Bilirubin: 1 mg/dL (ref 0.3–1.2)

## 2011-09-01 LAB — CBC
Platelets: 136 10*3/uL — ABNORMAL LOW (ref 150–400)
RBC: 5.27 MIL/uL (ref 4.22–5.81)
RDW: 13.5 % (ref 11.5–15.5)
WBC: 7.6 10*3/uL (ref 4.0–10.5)

## 2011-09-01 LAB — HIV ANTIBODY (ROUTINE TESTING W REFLEX): HIV: NONREACTIVE

## 2011-09-01 MED ORDER — METOPROLOL SUCCINATE ER 25 MG PO TB24: 25.0000 mg | ORAL_TABLET | Freq: Two times a day (BID) | ORAL | Status: DC

## 2011-09-01 NOTE — Patient Instructions (Signed)
Decrease Metoprolol succinate (Toprol) to 25 mg (1 tab) twice daily.  [facility] will call you with appointment for heart transplant appointment.

## 2011-09-01 NOTE — Progress Notes (Signed)
Primary Cardiologist: Dr. Royann Shivers  HPI: 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 Cath 2011 showed normal coronaries.  Admitted in early May with ICD shocks due to VT.   ECHO 08/05/11:  LVEF 20% with diffuse hypokinesis with restrictive physiology. LV severely dilated LVIDd = 8.2 cm (up from 7.8cm in 2011) Moderate perivalvular mitral regurgitation. LA moderately to severely dilated. RV with mildly reduced function; PAPP .   Carotid Doppler 08/05/11: No significant extracranial carotid artery stenosis  CPX 08/11/11:   FVC 3.02 (65%) FEV1 2.43 (66%) FEV1/FVC 80% MVV 150 (95%) Peak VO2 17.3 ml/kg/min (57% predicted) VE/VCO2 slope 48.3,  pRER 1.08, Vent threshold 13.4% (predicted 44.4%) VE/MVV 61.7% - EKG with frequent PVCs during exercise  LHC: 08/15/11: no CAD RHC: 08/15/11 Aortic pressure 93/79 (mean 85) mm Hg  Left ventricle 91/16 mm Hg with end-diastolic pressure of 29 mm Hg  RA 9 RV 49/9/12 PA 50/30 (39) PCWP 35 Fick CO 3.3/1.45 PA oxygen saturation 53%  AO oxygen saturation 97%  RA oxygen saturation 56%  PVR 1.0 Woods  SVR 1837  He returns for follow up today.  He feels ok.  He is not getting around too much.  He is able to go to the store but must get around slowly.   He denies edema/orthopnea/PND.  Occ dizziness with standing.  No syncope.  After cath, Dr. Salena Saner increased lasix 80 mg daily and lisinopril 10/5 mg.    Scheduled for ultrasound carotids/abdomen with Dr. Salena Saner in June.  Blood type back as O-positive    ROS: All systems negative except as listed in HPI, PMH and Problem List.  Past Medical History  Diagnosis Date  . AICD (automatic cardioverter/defibrillator) present   . Hypertension   . Congestive heart failure   . Pneumonia   . Bronchitis     Current Outpatient Prescriptions  Medication Sig Dispense Refill  . aspirin 325  MG tablet Take 325 mg by mouth daily.      Marland Kitchen co-enzyme Q-10 30 MG capsule Take 30 mg by mouth 3 (three) times daily.      . digoxin (LANOXIN) 0.125 MG tablet Take 0.125 mg by mouth daily.      . furosemide (LASIX) 40 MG tablet Take 80 mg by mouth daily.       Marland Kitchen lisinopril (PRINIVIL,ZESTRIL) 10 MG tablet Take 5-10 mg by mouth 2 (two) times daily. 10mg  in the AM, 5mg  (1/2 tab) in the PM      . metoprolol succinate (TOPROL-XL) 25 MG 24 hr tablet Take 75 mg by mouth daily.      Marland Kitchen omega-3 acid ethyl esters (LOVAZA) 1 G capsule Take 2 g by mouth 2 (two) times daily.         PHYSICAL EXAM: Filed Vitals:   09/01/11 1108  Pulse: 90  Weight: 220 lb 4 oz (99.905 kg)  SpO2: 98%  Doppler 78  General: Well appearing. No resp difficulty  HEENT: normal  Neck: supple. JVP flat Carotids 2+ bilat; no bruits. No lymphadenopathy or thryomegaly appreciated.  Cor: PMI laterally displaced. Regular rate & rhythm. 3/6 MR. +s3  Lungs: clear  Abdomen: soft, nontender, nondistended. No hepatosplenomegaly. No bruits or masses. Good bowel sounds.  Extremities: no cyanosis, clubbing, rash, edema  Neuro: alert & orientedx3, cranial nerves grossly intact. moves all 4 extremities w/o difficulty. Affect pleasant  ASSESSMENT & PLAN:

## 2011-09-01 NOTE — Assessment & Plan Note (Addendum)
NYHA III.  Have reviewed recent catheterization and CPX with the patient and his family.  Patient has severely reduced cardiac output and now with hypotension in setting of medication titration.  It is time to proceed with referral to transplant center.  This was discussed with the patient and his family and he agrees to proceed.  Will obtain blood type, hepatitis panel, HIV screen and discuss with [facility] Transplant team.  Will see if Dr. Salena Saner can have carotid dopplers scheduled for sooner than 6/19.  With low cardiac output will decrease Toprol to 50 mg daily.    Patient seen and examined with Ulyess Blossom, PA-C. We discussed all aspects of the encounter. I agree with the assessment and plan as stated above. We reviewed his cath and CPX test. He has multiple markers of high short-term risk including markedly elevated VE/VCO2 slope on CPX, markedly depressed CI, hypotension with need to start cutting back meds and recent VT. He also has progressive symptoms and is now nearing NYHA IIIB. I think we need to start the work-up for advanced therapies particularly given his blood type. I have contacted Dr. Jose Persia at [facility] to have him evaluated. I suspect he will likely need a BTT VAD given that he is likely to have a significant weight time for an organ. We wil cut Toprol back to 50 daily due to hypotension.

## 2011-09-02 ENCOUNTER — Encounter (HOSPITAL_COMMUNITY): Payer: Self-pay

## 2011-09-02 LAB — HEPATITIS PANEL, ACUTE
HCV Ab: NEGATIVE
Hep A IgM: NEGATIVE
Hep B C IgM: NEGATIVE

## 2011-09-07 ENCOUNTER — Inpatient Hospital Stay (HOSPITAL_COMMUNITY)
Admission: RE | Admit: 2011-09-07 | Payer: PRIVATE HEALTH INSURANCE | Source: Ambulatory Visit | Admitting: Cardiothoracic Surgery

## 2011-09-07 ENCOUNTER — Ambulatory Visit (HOSPITAL_COMMUNITY)
Admission: RE | Admit: 2011-09-07 | Discharge: 2011-09-07 | Disposition: A | Discharge status: Home / Self Care | Payer: PRIVATE HEALTH INSURANCE | Source: Ambulatory Visit | Attending: Cardiothoracic Surgery | Admitting: Cardiothoracic Surgery

## 2011-09-07 VITALS — BP 88/52 | HR 93 | Wt 220.0 lb

## 2011-09-07 DIAGNOSIS — I2789 Other specified pulmonary heart diseases: Secondary | ICD-10-CM | POA: Insufficient documentation

## 2011-09-07 DIAGNOSIS — I428 Other cardiomyopathies: Secondary | ICD-10-CM

## 2011-09-07 DIAGNOSIS — Z95810 Presence of automatic (implantable) cardiac defibrillator: Secondary | ICD-10-CM | POA: Insufficient documentation

## 2011-09-07 DIAGNOSIS — I43 Cardiomyopathy in diseases classified elsewhere: Secondary | ICD-10-CM | POA: Insufficient documentation

## 2011-09-07 DIAGNOSIS — I110 Hypertensive heart disease with heart failure: Secondary | ICD-10-CM | POA: Insufficient documentation

## 2011-09-07 DIAGNOSIS — I509 Heart failure, unspecified: Secondary | ICD-10-CM | POA: Insufficient documentation

## 2011-09-07 DIAGNOSIS — Z7982 Long term (current) use of aspirin: Secondary | ICD-10-CM | POA: Insufficient documentation

## 2011-09-07 DIAGNOSIS — I5030 Unspecified diastolic (congestive) heart failure: Secondary | ICD-10-CM | POA: Insufficient documentation

## 2011-09-07 DIAGNOSIS — Z8679 Personal history of other diseases of the circulatory system: Secondary | ICD-10-CM

## 2011-09-07 NOTE — Progress Notes (Signed)
[address]            [place] [address]          [phone]       DESMAN POLAK [facility] Medical Record #[id number] Date of Birth: 12/28/1955  Referring: Default, Provider, MD Primary Care: Sheila Oats, MD, MD  Chief Complaint:   No chief complaint on file.   History of Present Illness:     HPI: Mr. Krone is a 56 year old male with a history of HTN felt to be cause of non ischemic cardiomyopathy (EF: 20%), status post dual chamber Medtronic ICD implanted April 2011. Cath 2011 showed normal coronaries.  Admitted in early May with ICD shocks due to VT.He currently has DOE as well as significant orthopnea each night. He is being seen at [facility] later this month to start the eval process for Heart Tx. It is felt that he will need a VAD bridge to tx as he is blood group O+ and 220 lbs and a long wait for donor is anticipated.  ECHO 08/05/11:  LVEF 20% with diffuse hypokinesis with restrictive physiology. LV severely dilated LVIDd = 8.2 cm (up from 7.8cm in 2011) Moderate perivalvular mitral regurgitation. LA moderately to severely dilated. RV with mildly reduced function; PAPP .   Carotid Doppler 08/05/11: No significant extracranial carotid artery stenosis  CPX 08/11/11:   FVC 3.02 (65%) FEV1 2.43 (66%) FEV1/FVC 80% MVV 150 (95%) Peak VO2 17.3 ml/kg/min (57% predicted) VE/VCO2 slope 48.3,  pRER 1.08, Vent threshold 13.4% (predicted 44.4%) VE/MVV 61.7% - EKG with frequent PVCs during exercise  LHC: 08/15/11: no CAD RHC: 08/15/11 Aortic pressure 93/79 (mean 85) mm Hg  Left ventricle 91/16 mm Hg with end-diastolic pressure of 29 mm Hg  RA 9 RV 49/9/12 PA 50/30 (39) PCWP 35 Fick CO 3.3/1.45 PA oxygen saturation 53%  AO oxygen saturation 97%  RA oxygen saturation 56%  PVR 1.0 Woods  SVR 1837       Current Activity/ Functional Status: Limited due to CHF   Past Medical History  Diagnosis Date  . AICD (automatic  cardioverter/defibrillator) present   . Hypertension   . Congestive heart failure   . Pneumonia   . Bronchitis     Past Surgical History  Procedure Date  . Cardiac defibrillator placement   . Cardiac catheterization     History  Smoking status  . Never Smoker   Smokeless tobacco  . Not on file   History  Alcohol Use No    History   Social History  . Marital Status: Single    Spouse Name: N/A    Number of Children: N/A  . Years of Education: N/A   Occupational History  . Not on file.   Social History Main Topics  . Smoking status: Never Smoker   . Smokeless tobacco: Not on file  . Alcohol Use: No  . Drug Use: No  . Sexually Active:    Other Topics Concern  . Not on file   Social History Narrative  . No narrative on file    No Known Allergies  Current Outpatient Prescriptions  Medication Sig Dispense Refill  . aspirin 325 MG tablet Take 325 mg by mouth daily.      Marland Kitchen co-enzyme Q-10 30 MG capsule Take 30 mg by mouth 3 (three) times daily.      . digoxin (LANOXIN)  0.125 MG tablet Take 0.125 mg by mouth daily.      . furosemide (LASIX) 40 MG tablet Take 80 mg by mouth daily.       Marland Kitchen lisinopril (PRINIVIL,ZESTRIL) 10 MG tablet Take 10 mg by mouth daily.       . metoprolol succinate (TOPROL-XL) 25 MG 24 hr tablet Take 1 tablet (25 mg total) by mouth 2 (two) times daily.      Marland Kitchen omega-3 acid ethyl esters (LOVAZA) 1 G capsule Take 2 g by mouth 2 (two) times daily.         (Not in a hospital admission)  No family history on file.No hx cardiomypathy   Review of Systems: no hx thoracic trauma    Cardiac Review of Systems: Y or N  Chest Pain [    ]  Resting SOB [ Y ] Exertional SOB  [ Y ]  Orthopnea Gilian.Kraft  ]   Pedal Edema [   ]    Palpitations [Y  ] Syncope  [  ]   Presyncope [Y   ]  General Review of Systems: [Y] = yes [  ]=no Constitional: recent weight change [  ]; anorexia [  ]; fatigue [  ]; nausea [  ]; night sweats [  ]; fever [  ]; or chills [  ];                                                                                                                                           Dental: poor dentition[  ]; Last Dentist visit> 17YR  Eye : blurred vision [  ]; diplopia [   ]; vision changes [  ];  Amaurosis fugax[  ]; Resp: cough [  ];  wheezing[N  ];  hemoptysis[  ]; shortness of breath[ Y ]; paroxysmal nocturnal dyspnea[ Y ]; dyspnea on exertion[ Y ]; or orthopnea[  ];  GI:  gallstones[  ], vomiting[  ];  dysphagia[N  ]; melena[  ];  hematochezia Klaus.Mock  ]; heartburn[  ];   Hx of  Colonoscopy[  ]; GU: kidney stones [  ]; hematuria[  ];   dysuria [  ];  nocturia[  ];  history of     obstruction [  ];             Skin: rash, swelling[  ];, hair loss[ N ];  peripheral edema[  ];  or itching[  ]; Musculosketetal: myalgias[  ];  joint swelling[  ];  joint erythema[  ];  joint pain[  ];  back pain[  ];  Heme/Lymph: bruising[  ];  bleeding[ N ];  anemia[  ];  Neuro: TIA[  ];  headaches[  ];  stroke[  ];  vertigo[  ];  seizures[  ];   paresthesias[  ];  difficulty walking[  ];  Psych:depression[  ]; anxiety[  ];  Endocrine: diabetes[N  ];  thyroid dysfunction[  ];  Immunizations: Flu [  ]; Pneumococcal[  ];  Other:  Physical Exam: BP 88/52  Pulse 93  Wt 220 lb (99.791 kg)  SpO2 98%  Gen alert comfortable HEENT- normocephalic,dentition good Neck- no JVD,bruit,adenopathy Chest- clear Cor- NSR,+ S3,2/6 holosystolic murmur Abd- soft w/o pusatile mass Extrem- nontender,no edema Vasc- + pulses Neuro- intact   Diagnostic Studies & Laboratory data:   None today  Recent Radiology Findings:   No results found.    Recent Lab Findings: Lab Results  Component Value Date   WBC 7.6 09/01/2011   HGB 15.6 09/01/2011   HCT 46.0 09/01/2011   PLT 136* 09/01/2011   GLUCOSE 97 09/01/2011   CHOL 206* 08/06/2011   TRIG 121 08/06/2011   HDL 50 08/06/2011   LDLCALC 161* 08/06/2011   ALT 27 09/01/2011   AST 19 09/01/2011   NA 140 09/01/2011   K 4.2 09/01/2011   CL  100 09/01/2011   CREATININE 1.43* 09/01/2011   BUN 21 09/01/2011   CO2 28 09/01/2011   TSH 1.881 08/06/2011   INR 1.14 08/05/2011   HGBA1C 5.8* 08/05/2011      Assessment / Plan:     56 y/o male with class 4 HF progressing with increased symptoms, marginal BP, pul hypertension,low CI. EF .20 He does not have AI,TR, PFO. He appears to be a potential candidate for VAD as bridge to tx. Extensive discussion w/ patient and family about surgical aspects of VAD. Patient is interested and will return for followup in the HF clinic after visit to [facility] transplant clinic       VAN TRIGT III,Keno Caraway  09/07/2011 4:51 PM

## 2011-09-09 ENCOUNTER — Encounter: Payer: Self-pay | Admitting: Internal Medicine

## 2011-10-13 ENCOUNTER — Encounter (HOSPITAL_COMMUNITY): Payer: Self-pay

## 2011-10-13 ENCOUNTER — Ambulatory Visit (HOSPITAL_COMMUNITY)
Admission: RE | Admit: 2011-10-13 | Discharge: 2011-10-13 | Disposition: A | Discharge status: Home / Self Care | Payer: Medicare (Managed Care) | Source: Ambulatory Visit | Attending: Internal Medicine | Admitting: Internal Medicine

## 2011-10-13 VITALS — BP 104/82 | HR 92 | Wt 218.0 lb

## 2011-10-13 DIAGNOSIS — I509 Heart failure, unspecified: Secondary | ICD-10-CM | POA: Insufficient documentation

## 2011-10-13 DIAGNOSIS — I5022 Chronic systolic (congestive) heart failure: Secondary | ICD-10-CM | POA: Insufficient documentation

## 2011-10-13 DIAGNOSIS — Z8679 Personal history of other diseases of the circulatory system: Secondary | ICD-10-CM | POA: Insufficient documentation

## 2011-10-13 DIAGNOSIS — I428 Other cardiomyopathies: Secondary | ICD-10-CM

## 2011-10-13 DIAGNOSIS — Z95810 Presence of automatic (implantable) cardiac defibrillator: Secondary | ICD-10-CM | POA: Insufficient documentation

## 2011-10-13 LAB — BASIC METABOLIC PANEL
BUN: 22 mg/dL (ref 6–23)
Creatinine, Ser: 1.45 mg/dL — ABNORMAL HIGH (ref 0.50–1.35)
GFR calc non Af Amer: 52 mL/min — ABNORMAL LOW (ref >90)
Glucose, Bld: 99 mg/dL (ref 70–99)
Potassium: 3.9 mEq/L (ref 3.5–5.1)

## 2011-10-13 MED ORDER — METOPROLOL SUCCINATE ER 25 MG PO TB24: 100.0000 mg | ORAL_TABLET | Freq: Every day | ORAL | Status: DC

## 2011-10-13 NOTE — Progress Notes (Signed)
  Subjective:    Patient ID: Robert [name], male    DOB: 1955-09-04, 56 y.o.   MRN: [id number]  HPI Robert [name] is a 56 year old Afro-American male with idiopathic cardiomyopathy following the heart failure clinic since early this spring when he presented with acute decompensated heart failure after an AICD shock. The patient sounded normal coronaries, moderately elevated pulmonary hypertension, cardiac index of 1.9 and a 2-D echo showing no significant AI or TR. The patient is a nonsmoker and has excellent pulmonary function. His renal function is stable with a creatinine 1.3. He is maintained sinus rhythm. He is in the process of being evaluated at [facility] for listing for cardiac transplantation and it is felt that he will probably need an LVAD as a bridge to 2 and expected long wait for a donor of 1-2 years.  Since his last visit he is somewhat better. He is able to cycle a stationary bicycle for 15 minutes a day. He still has orthopnea and decreased exercise tolerance. His weight has been stable. He denies productive cough or fever. He appears to be compliant with his oral medications. He wishes to return to work 3-4 hours per day if possible.    Review of Systems no fever no change in weight no chest pain     Objective:   Physical Exam Heart rate 88 sinus blood pressure 110/70 oxygen saturation 98% room air temperature 98.9 General appearance a middle-aged black male no acute distress slightly anxious Lungs clear Cardiac regular rhythm without murmur positive S4 heart sounds Abdomen soft Extremities no significant edema good pulses, warm Neuro intact       Assessment & Plan:  Labs clubbing chemistries to check creatinine are pending. I discussed the patient's situation at length and outlined a plan of care which would ultimately result in cardiac transplantation at [facility] with a pretransplant LVAD implanted as a bridge for which he could be followed in the heart failure clinic at [facility]. He understands that this would necessitate Coumadin anticoagulation while he is supported by the LVAD and that there would be risks of bleeding, drive line infection, and possible progressive right heart dysfunction despite maximal support provided by the LVAD.  We'll continue to monitor the patient's clinical status closely at [facility] and be prepared to place an LVAD as a bridge once he becomes officially listed with the cardiac transplant program at [facility].

## 2011-10-13 NOTE — Patient Instructions (Addendum)
Increase Toprol 25 mg to 100 mg daily   (2 pills in the am and 2 pills in the pm) * If you notice that you are more tired with increase go back to taking 1 pill in the am and 1 pill in the pm.  Continue to weigh daily.  Follow up next week.

## 2011-10-13 NOTE — Addendum Note (Signed)
Encounter addended by: Kerin Perna, MD on: 10/13/2011  9:00 PM<BR>     Documentation filed: Notes Section, Charting, Inpatient Notes

## 2011-10-13 NOTE — Assessment & Plan Note (Signed)
Increased BB.

## 2011-10-13 NOTE — Progress Notes (Addendum)
Patient ID: Robert [name], male   DOB: 1956/02/04, 56 y.o.   MRN: [id number] Mr Bily will return to the HF clinic for careful monitoring of his Class 4 CHF in 1 wks to check BP on highr doe metoprolol

## 2011-10-13 NOTE — Assessment & Plan Note (Signed)
NYHA III. Volume status good. Being evaluated at [facility] for heart transplant and will return to 09/28/11. BP good today, however not sure how patient is taking his Toprol. Reports taking 25 mg TID, but in chart was 25 mg BID. Patient's pressure ok today so will increase to 100 mg daily ( 50 mg BID). Will follow closely for hypotension and asked patient to check BP at home and to call if in the 90's. Will check BMET today. Follow up next week.

## 2011-10-20 ENCOUNTER — Ambulatory Visit (HOSPITAL_COMMUNITY)
Admission: RE | Admit: 2011-10-20 | Discharge: 2011-10-20 | Disposition: A | Discharge status: Home / Self Care | Payer: Medicare (Managed Care) | Source: Ambulatory Visit | Attending: Internal Medicine | Admitting: Internal Medicine

## 2011-10-20 ENCOUNTER — Other Ambulatory Visit (HOSPITAL_COMMUNITY): Payer: Self-pay | Admitting: Anesthesiology

## 2011-10-20 VITALS — BP 100/72 | HR 100 | Wt 218.8 lb

## 2011-10-20 DIAGNOSIS — I5022 Chronic systolic (congestive) heart failure: Secondary | ICD-10-CM

## 2011-10-20 DIAGNOSIS — Z8709 Personal history of other diseases of the respiratory system: Secondary | ICD-10-CM | POA: Insufficient documentation

## 2011-10-20 DIAGNOSIS — Z7982 Long term (current) use of aspirin: Secondary | ICD-10-CM | POA: Insufficient documentation

## 2011-10-20 DIAGNOSIS — I428 Other cardiomyopathies: Secondary | ICD-10-CM | POA: Insufficient documentation

## 2011-10-20 DIAGNOSIS — I509 Heart failure, unspecified: Secondary | ICD-10-CM | POA: Insufficient documentation

## 2011-10-20 DIAGNOSIS — Z8701 Personal history of pneumonia (recurrent): Secondary | ICD-10-CM | POA: Insufficient documentation

## 2011-10-20 DIAGNOSIS — I059 Rheumatic mitral valve disease, unspecified: Secondary | ICD-10-CM | POA: Insufficient documentation

## 2011-10-20 DIAGNOSIS — Z95810 Presence of automatic (implantable) cardiac defibrillator: Secondary | ICD-10-CM | POA: Insufficient documentation

## 2011-10-20 DIAGNOSIS — I10 Essential (primary) hypertension: Secondary | ICD-10-CM | POA: Insufficient documentation

## 2011-10-20 MED ORDER — SPIRONOLACTONE 25 MG PO TABS: 12.5000 mg | ORAL_TABLET | Freq: Every day | ORAL | Status: DC

## 2011-10-20 NOTE — Assessment & Plan Note (Addendum)
NYHA II. Volume status good, may be somewhat dry. Lengthy discussion with patient about how he is taking his diuretics. He reports he is not taking the extra 40 mg daily, just when he feels like he has extra fluid. Reiterated to take 80 mg lasix daily and to take 40 mg extra if weight is up greater than 3lbs. BP low 100's and patient reported more palpitations with increase of Toprol ? so he went back to taking it 25 BID. Weights stable. Follow up at [facility] 7/25 and Dr. Salena Saner 7/26. K+ 3.9 last week on no supplementation. Will start Spironolactone 12.5 mg daily and check BMET next week.  Called Dr. Renaye Rakers office to see if ICD has been recently interrogated. Last interrogation June 10th and patient had some runs of VT, but no shocks. They will interrogate device next week for any further VT.

## 2011-10-20 NOTE — Progress Notes (Signed)
Primary Cardiologist: Dr. Royann Shivers  HPI: 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 Cath 2011 showed normal coronaries.  Admitted in early May with ICD shocks due to VT.   ECHO 08/05/11:  LVEF 20% with diffuse hypokinesis with restrictive physiology. LV severely dilated LVIDd = 8.2 cm (up from 7.8cm in 2011) Moderate perivalvular mitral regurgitation. LA moderately to severely dilated. RV with mildly reduced function; PAPP .   Carotid Doppler 08/05/11: No significant extracranial carotid artery stenosis  CPX 08/11/11:   FVC 3.02 (65%) FEV1 2.43 (66%) FEV1/FVC 80% MVV 150 (95%) Peak VO2 17.3 ml/kg/min (57% predicted) VE/VCO2 slope 48.3,  pRER 1.08, Vent threshold 13.4% (predicted 44.4%) VE/MVV 61.7% - EKG with frequent PVCs during exercise  LHC: 08/15/11: no CAD RHC: 08/15/11 Aortic pressure 93/79 (mean 85) mm Hg  Left ventricle 91/16 mm Hg with end-diastolic pressure of 29 mm Hg  RA 9 RV 49/9/12 PA 50/30 (39) PCWP 35 Fick CO 3.3/1.45 PA oxygen saturation 53%  AO oxygen saturation 97%  RA oxygen saturation 56%  PVR 1.0 Woods  SVR 1837  Labs: K+ 3.9     Creatinine 1.45    BUN 52  Follow Up: Last visit increased Toprol to 50 mg BID. Patient reports noticed his heart racing more and didn't feel too well on it, so he went back to 25mg  BID. Mowing the grass and working in the garden. Overall feeling pretty good. Reports can walk around the grocery store at a steady pace without stopping. Denies SOB/orthonepa/CP. Taking medications and weighing daily. L foot tender to touch on top and reports he thinks he might of strained it in the garden yesterday.  No ICD shocks.   ROS: All systems negative except as listed in HPI, PMH and Problem List.  Past Medical History  Diagnosis Date  . AICD (automatic cardioverter/defibrillator) present   .  Hypertension   . Congestive heart failure   . Pneumonia   . Bronchitis     Current Outpatient Prescriptions  Medication Sig Dispense Refill  . aspirin 325 MG tablet Take 325 mg by mouth daily.      Marland Kitchen co-enzyme Q-10 30 MG capsule Take 30 mg by mouth 3 (three) times daily.      . digoxin (LANOXIN) 0.125 MG tablet Take 0.125 mg by mouth daily.      . furosemide (LASIX) 40 MG tablet 80 mg am, 40 mg pm      . lisinopril (PRINIVIL,ZESTRIL) 10 MG tablet Take 10 mg by mouth daily.       . metoprolol succinate (TOPROL-XL) 25 MG 24 hr tablet Take 25 mg by mouth 2 (two) times daily.      Marland Kitchen omega-3 acid ethyl esters (LOVAZA) 1 G capsule Take 2 g by mouth 2 (two) times daily.      Marland Kitchen DISCONTD: metoprolol succinate (TOPROL-XL) 25 MG 24 hr tablet Take 4 tablets (100 mg total) by mouth daily. Take 2 tablets am and 2 tablets pm.  120 tablet  3     PHYSICAL EXAM: Filed Vitals:   10/20/11 1006  BP: 100/72  Pulse: 100  Weight: 218 lb 12 oz (99.224 kg)  SpO2: 97%    General: Well appearing. No resp difficulty  HEENT: normal  Neck: supple. JVP flat Carotids 2+ bilat; no bruits. No lymphadenopathy or thryomegaly appreciated.  Cor: PMI laterally displaced. Regular  rate & rhythm. 3/6 MR. +s3  Lungs: clear  Abdomen: soft, nontender, nondistended. No hepatosplenomegaly. No bruits or masses. Good bowel sounds.  Extremities: no cyanosis, clubbing, rash, edema  Neuro: alert & orientedx3, cranial nerves grossly intact. moves all 4 extremities w/o difficulty. Affect pleasant; L foot on top slight red  EKG: SR with 1 degree AV block  ASSESSMENT & PLAN:

## 2011-10-20 NOTE — Patient Instructions (Addendum)
Start Spironolactone 12.5 mg daily (1/2 tablet).  Continue taking medications as prescribed.  Labs: BMET next week at [facility]            [address]            [address] Floor            Take 80 mg lasix daily, if you notice your weight increases greater than 3 lbs in a day or 5lbs in a week take extra 40 mg lasix.  Call if any problems.  Follow up 3 weeks with Dr. Gala Romney

## 2011-10-22 NOTE — Addendum Note (Signed)
Encounter addended by: Deitra Mayo on: 10/22/2011  8:38 AM<BR>     Documentation filed: Charges VN

## 2011-10-27 ENCOUNTER — Other Ambulatory Visit (INDEPENDENT_AMBULATORY_CARE_PROVIDER_SITE_OTHER): Payer: PRIVATE HEALTH INSURANCE

## 2011-10-27 DIAGNOSIS — I5022 Chronic systolic (congestive) heart failure: Secondary | ICD-10-CM

## 2011-10-27 DIAGNOSIS — I509 Heart failure, unspecified: Secondary | ICD-10-CM

## 2011-10-27 DIAGNOSIS — Z01811 Encounter for preprocedural respiratory examination: Secondary | ICD-10-CM

## 2011-10-27 LAB — BASIC METABOLIC PANEL
BUN: 22 mg/dL (ref 6–23)
Calcium: 9 mg/dL (ref 8.4–10.5)
Chloride: 103 mEq/L (ref 96–112)
Creatinine, Ser: 1.5 mg/dL (ref 0.4–1.5)

## 2011-10-31 ENCOUNTER — Emergency Department (HOSPITAL_COMMUNITY): Payer: PRIVATE HEALTH INSURANCE

## 2011-10-31 ENCOUNTER — Encounter (HOSPITAL_COMMUNITY): Payer: Self-pay | Admitting: Emergency Medicine

## 2011-10-31 ENCOUNTER — Emergency Department (HOSPITAL_COMMUNITY)
Admission: EM | Admit: 2011-10-31 | Discharge: 2011-10-31 | Disposition: A | Discharge status: Home / Self Care | Payer: PRIVATE HEALTH INSURANCE | Attending: Emergency Medicine | Admitting: Emergency Medicine

## 2011-10-31 DIAGNOSIS — Z95810 Presence of automatic (implantable) cardiac defibrillator: Secondary | ICD-10-CM | POA: Insufficient documentation

## 2011-10-31 DIAGNOSIS — R5381 Other malaise: Secondary | ICD-10-CM | POA: Insufficient documentation

## 2011-10-31 DIAGNOSIS — I509 Heart failure, unspecified: Secondary | ICD-10-CM | POA: Insufficient documentation

## 2011-10-31 DIAGNOSIS — R29898 Other symptoms and signs involving the musculoskeletal system: Secondary | ICD-10-CM

## 2011-10-31 DIAGNOSIS — I10 Essential (primary) hypertension: Secondary | ICD-10-CM | POA: Insufficient documentation

## 2011-10-31 LAB — BASIC METABOLIC PANEL
BUN: 21 mg/dL (ref 6–23)
CO2: 23 mEq/L (ref 19–32)
Calcium: 9.1 mg/dL (ref 8.4–10.5)
Creatinine, Ser: 1.28 mg/dL (ref 0.50–1.35)
Glucose, Bld: 100 mg/dL — ABNORMAL HIGH (ref 70–99)

## 2011-10-31 LAB — CBC
MCH: 30 pg (ref 26.0–34.0)
MCV: 87 fL (ref 78.0–100.0)
Platelets: 133 10*3/uL — ABNORMAL LOW (ref 150–400)
RDW: 13.8 % (ref 11.5–15.5)
WBC: 7 10*3/uL (ref 4.0–10.5)

## 2011-10-31 LAB — TROPONIN I: Troponin I: <0.3 ng/mL (ref <0.30)

## 2011-10-31 NOTE — ED Notes (Signed)
Complaints of weakness in bilateral arms and pain in both shoulders, with nausea, no vomiting. Sweating profusely symptoms this morning, episode lasted approximately 10 minutes and then went away. Denies SOB, Patient has an implanted defibrillator in the left chest. Alert and oriented times four denies pain at this time.

## 2011-10-31 NOTE — ED Notes (Signed)
Pt. Stated, I was also sweating and just felt sick.

## 2011-10-31 NOTE — ED Notes (Signed)
Pt. Stated, i woke up this am and both of my shoulders were hurting and my left arm was weak.

## 2011-10-31 NOTE — ED Provider Notes (Addendum)
History     CSN: [id number]  Arrival date & time 10/31/11  0754   First MD Initiated Contact with Patient 10/31/11 [phone]      Chief Complaint  Patient presents with  . Weakness    (Consider location/radiation/quality/duration/timing/severity/associated sxs/prior treatment) HPI Comments: Patient presents for a 3-5 minute episode of bilateral shoulder pain that radiated down both his arms.  He states it occurred shortly after waking this morning.  He had no chest pain or shortness of breath.  He describes mild nausea but no vomiting.  He did have some brief diaphoresis which resolved.  He describes weakness in both his arms but no numbness.  No falls or injuries.  No prior similar symptoms.  No abdominal pain.  Patient did not pass out or feel lightheaded.  Patient is a 56 y.o. male presenting with weakness. The history is provided by the patient. No language interpreter was used.  Weakness The primary symptoms include nausea. Primary symptoms do not include headaches, fever or vomiting.  Additional symptoms include weakness.    Past Medical History  Diagnosis Date  . AICD (automatic cardioverter/defibrillator) present   . Hypertension   . Congestive heart failure   . Pneumonia   . Bronchitis     Past Surgical History  Procedure Date  . Cardiac defibrillator placement   . Cardiac catheterization     History reviewed. No pertinent family history.  History  Substance Use Topics  . Smoking status: Never Smoker   . Smokeless tobacco: Not on file  . Alcohol Use: No      Review of Systems  Constitutional: Positive for diaphoresis. Negative for fever and chills.  HENT: Negative.   Eyes: Negative.   Respiratory: Negative.  Negative for cough and shortness of breath.   Cardiovascular: Negative.  Negative for chest pain.  Gastrointestinal: Positive for nausea. Negative for vomiting and abdominal pain.  Genitourinary: Negative.  Negative for hematuria.  Musculoskeletal:  Negative for back pain.  Skin: Negative.  Negative for color change and rash.  Neurological: Positive for weakness. Negative for syncope, numbness and headaches.  Hematological: Negative.  Negative for adenopathy.  Psychiatric/Behavioral: Negative.  Negative for confusion.  All other systems reviewed and are negative.    Allergies  Review of patient's allergies indicates no known allergies.  Home Medications   Current Outpatient Rx  Name Route Sig Dispense Refill  . ASPIRIN 325 MG PO TABS Oral Take 325 mg by mouth daily.    Marland Kitchen COENZYME Q10 30 MG PO CAPS Oral Take 30 mg by mouth 3 (three) times daily.    Marland Kitchen DIGOXIN 0.125 MG PO TABS Oral Take 0.125 mg by mouth daily.    . FUROSEMIDE 40 MG PO TABS Oral Take 40 mg by mouth 2 (two) times daily. Takes an additional tablet as needed for fluid retention    . LISINOPRIL 10 MG PO TABS Oral Take 10 mg by mouth daily.     Marland Kitchen METOPROLOL SUCCINATE ER 25 MG PO TB24 Oral Take 25 mg by mouth 2 (two) times daily.    . OMEGA-3-ACID ETHYL ESTERS 1 G PO CAPS Oral Take 2 g by mouth 2 (two) times daily.    Marland Kitchen SPIRONOLACTONE 25 MG PO TABS Oral Take 0.5 tablets (12.5 mg total) by mouth daily. 30 tablet 3  . TRAMADOL HCL 50 MG PO TABS Oral Take 50 mg by mouth every 6 (six) hours as needed. For pain      BP 98/74  Pulse 69  Temp 97.5 F (36.4 C) (Oral)  Resp 16  SpO2 100%  Physical Exam  Nursing note and vitals reviewed. Constitutional: He is oriented to person, place, and time. He appears well-developed and well-nourished.  Non-toxic appearance. He does not have a sickly appearance.  HENT:  Head: Normocephalic and atraumatic.  Eyes: Conjunctivae, EOM and lids are normal. Pupils are equal, round, and reactive to light.  Neck: Trachea normal, normal range of motion and full passive range of motion without pain. Neck supple.  Cardiovascular: Normal rate, regular rhythm and normal heart sounds.   No murmur heard. Pulmonary/Chest: Effort normal and breath  sounds normal. No respiratory distress. He has no wheezes. He has no rales.  Abdominal: Soft. Normal appearance. He exhibits no distension. There is no tenderness. There is no rebound and no CVA tenderness.  Musculoskeletal: Normal range of motion.       Palpable radial pulses that are equal in both arms.  Capillary refill in his fingers less than 2 seconds bilaterally.  Patient has symmetric in 5 out of 5 biceps strength, deltoid strength and grip strength.  No decreased sensation to light touch in either arm.  No C-spine tenderness on examination  Neurological: He is alert and oriented to person, place, and time. He has normal strength.  Skin: Skin is warm, dry and intact. No rash noted.  Psychiatric: He has a normal mood and affect. His behavior is normal. Judgment and thought content normal.    ED Course  Procedures (including critical care time)  Results for orders placed during the hospital encounter of 10/31/11  CBC      Component Value Range   WBC 7.0  4.0 - 10.5 K/uL   RBC 4.94  4.22 - 5.81 MIL/uL   Hemoglobin 14.8  13.0 - 17.0 g/dL   HCT 96.0  45.4 - 09.8 %   MCV 87.0  78.0 - 100.0 fL   MCH 30.0  26.0 - 34.0 pg   MCHC 34.4  30.0 - 36.0 g/dL   RDW 11.9  14.7 - 82.9 %   Platelets 133 (*) 150 - 400 K/uL  BASIC METABOLIC PANEL      Component Value Range   Sodium 137  135 - 145 mEq/L   Potassium 4.4  3.5 - 5.1 mEq/L   Chloride 102  96 - 112 mEq/L   CO2 23  19 - 32 mEq/L   Glucose, Bld 100 (*) 70 - 99 mg/dL   BUN 21  6 - 23 mg/dL   Creatinine, Ser 5.62  0.50 - 1.35 mg/dL   Calcium 9.1  8.4 - 13.0 mg/dL   GFR calc non Af Amer 61 (*) >90 mL/min   GFR calc Af Amer 71 (*) >90 mL/min  TROPONIN I      Component Value Range   Troponin I <0.30  <0.30 ng/mL   Dg Chest 2 View  10/31/2011  *RADIOLOGY REPORT*  Clinical Data: 56 year old male with chest pain.  CHEST - 2 VIEW  Comparison: 08/05/2011 and prior chest radiographs back to 2011  Findings: Cardiomegaly and left AICD /  pacemaker again noted. The lungs are clear. There is no evidence of focal airspace disease, pulmonary edema, suspicious pulmonary nodule/mass, pleural effusion, or pneumothorax. No acute bony abnormalities are identified.  IMPRESSION: Cardiomegaly without evidence of acute cardiopulmonary disease.  Original Report Authenticated By: Rosendo Gros, M.D.      Date: 10/31/2011  Rate: 74  Rhythm: normal sinus rhythm vs. Atrial paced rhythm  QRS  Axis: left  Intervals: PR prolonged  ST/T Wave abnormalities: Inverted/flattened T waves in lateral leads  Conduction Disutrbances:first-degree A-V block  and nonspecific intraventricular conduction delay  Narrative Interpretation:   Old EKG Reviewed: unchanged from 08-04-11 except NSR on old ECG    MDM  Patient with episode does not specifically appear to be consistent with ACS.  On review of patient's medical records he does not have any coronary artery disease on a recent catheterization completed in May 2013.  It is unlikely that this represents an MI in this patient.  He does have significant congestive heart failure and has been referred to a transplant center.  Patient's symptoms are also not consistent with TIA or stroke as he describes weakness and pain in both his arms but he was still able to move both his arms and had no numbness.  Patient's EKG is essentially unchanged.  His vital signs appear to be normal and the patient is without any lightheadedness at this time.  Patient will have basic laboratory studies checked and I believe will be safe for discharge home if normal.        Nat Christen, MD 10/31/11 [phone]  Patient with normal laboratory studies.  No continued symptoms.  No signs of CHF exacerbation.  As patient had recent catheterization with no signs of coronary disease I believe the patient is safe for discharge home with instructions to return for worsening symptoms.  Nat Christen, MD 10/31/11 [phone]

## 2011-11-11 ENCOUNTER — Ambulatory Visit (HOSPITAL_COMMUNITY)
Admission: RE | Admit: 2011-11-11 | Discharge: 2011-11-11 | Disposition: A | Discharge status: Home / Self Care | Payer: PRIVATE HEALTH INSURANCE | Source: Ambulatory Visit | Attending: Internal Medicine | Admitting: Internal Medicine

## 2011-11-11 ENCOUNTER — Encounter (HOSPITAL_COMMUNITY): Payer: Self-pay

## 2011-11-11 VITALS — BP 110/68 | HR 84 | Resp 18 | Ht 74.0 in | Wt 214.0 lb

## 2011-11-11 DIAGNOSIS — I5022 Chronic systolic (congestive) heart failure: Secondary | ICD-10-CM | POA: Insufficient documentation

## 2011-11-11 DIAGNOSIS — I10 Essential (primary) hypertension: Secondary | ICD-10-CM | POA: Insufficient documentation

## 2011-11-11 MED ORDER — LISINOPRIL 10 MG PO TABS: ORAL_TABLET | ORAL | Status: DC

## 2011-11-11 NOTE — Patient Instructions (Addendum)
Take Lisinopril 10 mg in am (1 tab) and 5 mg in pm (1/2 tab)  Do the following things EVERYDAY: 1) Weigh yourself in the morning before breakfast. Write it down and keep it in a log. 2) Take your medicines as prescribed 3) Eat low salt foods-Limit salt (sodium) to 2000 mg per day.  4) Stay as active as you can everyday 5) Limit all fluids for the day to less than 2 liters  Follow up in 2 weeks with Dr Gala Romney

## 2011-11-11 NOTE — Assessment & Plan Note (Signed)
NYHA III. Dyspnea after walking 20-30 minutes.   Volume status stable. SBP ok. Reviewed most recent BMET. Will not titrate beta blocker as he did not tolerate in past due to hypotension. Continue lisinopril 10 mg in am and add 5 mg lisinopril at night. Check BMET next visit. Ongoing transplant work up at [facility]. Follow up in 2 weeks with Dr Gala Romney.

## 2011-11-11 NOTE — Assessment & Plan Note (Signed)
SBP stable. Continue current regimen.

## 2011-11-11 NOTE — Progress Notes (Signed)
Patient ID: Robert [name], male   DOB: [birth year], 56 y.o.   MRN: [id number] Primary Cardiologist: Dr. Royann Shivers  HPI: 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 Cath 2011 showed normal coronaries.  Admitted in early May with ICD shocks due to VT.   ECHO 08/05/11:  LVEF 20% with diffuse hypokinesis with restrictive physiology. LV severely dilated LVIDd = 8.2 cm (up from 7.8cm in 2011) Moderate perivalvular mitral regurgitation. LA moderately to severely dilated. RV with mildly reduced function; PAPP .   Carotid Doppler 08/05/11: No significant extracranial carotid artery stenosis  CPX 08/11/11:   FVC 3.02 (65%) FEV1 2.43 (66%) FEV1/FVC 80% MVV 150 (95%) Peak VO2 17.3 ml/kg/min (57% predicted) VE/VCO2 slope 48.3,  pRER 1.08, Vent threshold 13.4% (predicted 44.4%) VE/MVV 61.7% - EKG with frequent PVCs during exercise  LHC: 08/15/11: no CAD RHC: 08/15/11 Aortic pressure 93/79 (mean 85) mm Hg  Left ventricle 91/16 mm Hg with end-diastolic pressure of 29 mm Hg  RA 9 RV 49/9/12 PA 50/30 (39) PCWP 35 Fick CO 3.3/1.45 PA oxygen saturation 53%  AO oxygen saturation 97%  RA oxygen saturation 56%  PVR 1.0 Woods  SVR 1837  10/31/11 Potassium 4.4 Creatinine 1.28 Treated with prednisone for gout.   He returns for follow up. Last visit Spironolactone added. He is being followed at [facility] and he is being worked up for potential heart transplant. He will follow up in 3 months at [facility].Plan for stress test at that time.  He did not tolerate increase Toprol-XL due to hypotension/palpitations.  Denies SOB/PND/Orthopnea. Occasionally dizzy. Able to walk 20-30 mintues without dyspnea. Weight at home 214. Appetite ok. No ICD shocks. Compliant medications. He does not smoke or drink alcohol.   ROS: All systems negative except as listed in HPI, PMH and Problem List.  Past  Medical History  Diagnosis Date  . AICD (automatic cardioverter/defibrillator) present   . Hypertension   . Congestive heart failure   . Pneumonia   . Bronchitis     Current Outpatient Prescriptions  Medication Sig Dispense Refill  . aspirin 325 MG tablet Take 325 mg by mouth daily.      Marland Kitchen co-enzyme Q-10 30 MG capsule Take 30 mg by mouth 3 (three) times daily.      . digoxin (LANOXIN) 0.125 MG tablet Take 0.125 mg by mouth daily.      . furosemide (LASIX) 40 MG tablet Take 40 mg by mouth 2 (two) times daily. Takes an additional tablet as needed for fluid retention      . lisinopril (PRINIVIL,ZESTRIL) 10 MG tablet Take 10 mg by mouth daily.       . metoprolol succinate (TOPROL-XL) 25 MG 24 hr tablet Take 25 mg by mouth 2 (two) times daily.      Marland Kitchen omega-3 acid ethyl esters (LOVAZA) 1 G capsule Take 2 g by mouth 2 (two) times daily.      Marland Kitchen spironolactone (ALDACTONE) 25 MG tablet Take 0.5 tablets (12.5 mg total) by mouth daily.  30 tablet  3  . traMADol (ULTRAM) 50 MG tablet Take 50 mg by mouth every 6 (six) hours as needed. For pain         PHYSICAL EXAM: Filed Vitals:   11/11/11 0950  BP: 110/68  Pulse: 84  Resp: 18  Height: 6\' 2"  (1.88 m)  Weight: 214 lb (97.07  kg)  SpO2: 93%    General: Well appearing. No resp difficulty  HEENT: normal  Neck: supple. JVP flat Carotids 2+ bilat; no bruits. No lymphadenopathy or thryomegaly appreciated.  Cor: PMI laterally displaced. Regular rate & rhythm. 3/6 MR. +s3  Lungs: clear  Abdomen: soft, nontender, nondistended. No hepatosplenomegaly. No bruits or masses. Good bowel sounds.  Extremities: no cyanosis, clubbing, rash, edema  Neuro: alert & orientedx3, cranial nerves grossly intact. moves all 4 extremities w/o difficulty. Affect pleasant   ASSESSMENT & PLAN:

## 2011-11-29 ENCOUNTER — Ambulatory Visit (HOSPITAL_COMMUNITY)
Admission: RE | Admit: 2011-11-29 | Discharge: 2011-11-29 | Disposition: A | Discharge status: Home / Self Care | Payer: Medicare (Managed Care) | Source: Ambulatory Visit | Attending: Internal Medicine | Admitting: Internal Medicine

## 2011-11-29 VITALS — BP 110/72 | HR 74 | Wt 216.2 lb

## 2011-11-29 DIAGNOSIS — I10 Essential (primary) hypertension: Secondary | ICD-10-CM | POA: Insufficient documentation

## 2011-11-29 DIAGNOSIS — I5022 Chronic systolic (congestive) heart failure: Secondary | ICD-10-CM

## 2011-11-29 LAB — BASIC METABOLIC PANEL
BUN: 22 mg/dL (ref 6–23)
CO2: 27 mEq/L (ref 19–32)
Calcium: 9.4 mg/dL (ref 8.4–10.5)
Chloride: 103 mEq/L (ref 96–112)
Creatinine, Ser: 1.62 mg/dL — ABNORMAL HIGH (ref 0.50–1.35)
Glucose, Bld: 89 mg/dL (ref 70–99)

## 2011-11-29 MED ORDER — FUROSEMIDE 40 MG PO TABS: 40.0000 mg | ORAL_TABLET | Freq: Two times a day (BID) | ORAL | Status: DC

## 2011-11-29 NOTE — Assessment & Plan Note (Signed)
As above, increase spiro 25 mg daily.

## 2011-11-29 NOTE — Assessment & Plan Note (Addendum)
NYHA III.  Volume status mildly elevated will increase spiro 25 mg daily.  He had been off his diet and will restart low sodium diet.  Will continue to follow along with [facility] for med titration while awaiting possible heart transplant listing. BMET today.

## 2011-11-29 NOTE — Progress Notes (Signed)
Patient ID: KAYA KLAUSING, male   DOB: 01-Oct-1955, 55 y.o.   MRN: [id number] Primary Cardiologist: Dr. Royann Shivers  HPI: Mr. 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 Cath 2011 showed normal coronaries.  Admitted in early May with ICD shocks due to VT.   ECHO 08/05/11:  LVEF 20% with diffuse hypokinesis with restrictive physiology. LV severely dilated LVIDd = 8.2 cm (up from 7.8cm in 2011) Moderate perivalvular mitral regurgitation. LA moderately to severely dilated. RV with mildly reduced function; PAPP .   Carotid Doppler 08/05/11: No significant extracranial carotid artery stenosis  CPX 08/11/11:   FVC 3.02 (65%) FEV1 2.43 (66%) FEV1/FVC 80% MVV 150 (95%) Peak VO2 17.3 ml/kg/min (57% predicted) VE/VCO2 slope 48.3,  pRER 1.08, Vent threshold 13.4% (predicted 44.4%) VE/MVV 61.7% - EKG with frequent PVCs during exercise  LHC: 08/15/11: no CAD RHC: 08/15/11 Aortic pressure 93/79 (mean 85) mm Hg  Left ventricle 91/16 mm Hg with end-diastolic pressure of 29 mm Hg  RA 9 RV 49/9/12 PA 50/30 (39) PCWP 35 Fick CO 3.3/1.45 PA oxygen saturation 53%  AO oxygen saturation 97%  RA oxygen saturation 56%  PVR 1.0 Woods  SVR 1837  10/31/11 Potassium 4.4 Creatinine 1.28 Treated with prednisone for gout.   He returns for follow up. Last visit Spironolactone added. He is being followed at [facility] for potential heart transplant.  Will complete CPX and follow up in Oct.  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  He is able to walk 30 minutes without dyspnea.  He is tolerating medications well.  Weight stable.  No SOB/PND or orthopnea.  Occ dizziness when he stands up to fast.  No chest pain or shocks.    ROS: All systems negative except as listed in HPI, PMH and Problem List.  Past Medical History  Diagnosis Date  . AICD  (automatic cardioverter/defibrillator) present   . Hypertension   . Congestive heart failure   . Pneumonia   . Bronchitis     Current Outpatient Prescriptions  Medication Sig Dispense Refill  . aspirin 325 MG tablet Take 325 mg by mouth daily.      Marland Kitchen co-enzyme Q-10 30 MG capsule Take 30 mg by mouth 3 (three) times daily.      . digoxin (LANOXIN) 0.125 MG tablet Take 0.125 mg by mouth daily.      . furosemide (LASIX) 40 MG tablet Take 1 tablet (40 mg total) by mouth 2 (two) times daily. Takes an additional tablet as needed for fluid retention  270 tablet  3  . lisinopril (PRINIVIL,ZESTRIL) 10 MG tablet Take 10 mg in am and 5 mg in pm  45 tablet  3  . metoprolol succinate (TOPROL-XL) 25 MG 24 hr tablet Take 25 mg by mouth 2 (two) times daily.      Marland Kitchen omega-3 acid ethyl esters (LOVAZA) 1 G capsule Take 2 g by mouth 2 (two) times daily.      Marland Kitchen spironolactone (ALDACTONE) 25 MG tablet Take 0.5 tablets (12.5 mg total) by mouth daily.  30 tablet  3  . DISCONTD: furosemide (LASIX) 40 MG tablet Take 40 mg by mouth 2 (two) times daily. Takes an additional tablet as needed for fluid retention         PHYSICAL EXAM: Filed Vitals:   11/29/11 1219  BP: 110/72  Pulse: 74  Weight: 216 lb 4 oz (98.09 kg)  SpO2: 97%    General: Well appearing. No resp difficulty  HEENT: normal  Neck: supple. JVP flat Carotids 2+ bilat; no bruits. No lymphadenopathy or thryomegaly appreciated.  Cor: PMI laterally displaced. Regular rate & rhythm. 3/6 MR. +s3  Lungs: clear  Abdomen: soft, nontender, nondistended. No hepatosplenomegaly. No bruits or masses. Good bowel sounds.  Extremities: no cyanosis, clubbing, rash, edema  Neuro: alert & orientedx3, cranial nerves grossly intact. moves all 4 extremities w/o difficulty. Affect pleasant   ASSESSMENT & PLAN:

## 2011-11-29 NOTE — Patient Instructions (Addendum)
Increase spironolactone 1 tab daily.   Follow up 3 weeks with Dr. Gala Romney  Labs today  Do the following things EVERYDAY: 1) Weigh yourself in the morning before breakfast. Write it down and keep it in a log. 2) Take your medicines as prescribed 3) Eat low salt foods-Limit salt (sodium) to 2000 mg per day.  4) Stay as active as you can everyday 5) Limit all fluids for the day to less than 2 liters

## 2011-12-02 ENCOUNTER — Telehealth (HOSPITAL_COMMUNITY): Payer: Self-pay | Admitting: *Deleted

## 2011-12-02 DIAGNOSIS — I5022 Chronic systolic (congestive) heart failure: Secondary | ICD-10-CM

## 2011-12-02 NOTE — Telephone Encounter (Signed)
Pt will recheck labs 9/3 at [facility]

## 2011-12-02 NOTE — Telephone Encounter (Signed)
Message copied by Noralee Space on Thu Dec 02, 2011  3:09 PM ------      Message from: Noralee Space      Created: Mon Nov 29, 2011  5:03 PM       Recheck next week per Ulyess Blossom, [place]

## 2011-12-07 ENCOUNTER — Ambulatory Visit (INDEPENDENT_AMBULATORY_CARE_PROVIDER_SITE_OTHER): Payer: PRIVATE HEALTH INSURANCE | Admitting: *Deleted

## 2011-12-07 ENCOUNTER — Other Ambulatory Visit: Payer: Self-pay | Admitting: *Deleted

## 2011-12-07 DIAGNOSIS — I5022 Chronic systolic (congestive) heart failure: Secondary | ICD-10-CM

## 2011-12-07 LAB — BASIC METABOLIC PANEL
BUN: 18 mg/dL (ref 6–23)
CO2: 27 mEq/L (ref 19–32)
Calcium: 9.3 mg/dL (ref 8.4–10.5)
Creatinine, Ser: 1.4 mg/dL (ref 0.4–1.5)
GFR: 65.19 mL/min (ref >60.00)
Glucose, Bld: 90 mg/dL (ref 70–99)

## 2011-12-20 ENCOUNTER — Ambulatory Visit (HOSPITAL_COMMUNITY)
Admission: RE | Admit: 2011-12-20 | Discharge: 2011-12-20 | Disposition: A | Discharge status: Home / Self Care | Payer: Medicare (Managed Care) | Source: Ambulatory Visit | Attending: Internal Medicine | Admitting: Internal Medicine

## 2011-12-20 VITALS — BP 104/88 | HR 88 | Wt 217.8 lb

## 2011-12-20 DIAGNOSIS — I5022 Chronic systolic (congestive) heart failure: Secondary | ICD-10-CM | POA: Insufficient documentation

## 2011-12-20 NOTE — Progress Notes (Signed)
Patient ID: Robert [name], male   DOB: [birth year], 56 y.o.   MRN: [id number] Primary Cardiologist: Dr. 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 Cath 2011 showed normal coronaries.  Admitted in early May with ICD shocks due to VT.   ECHO 08/05/11:  LVEF 20% with diffuse hypokinesis with restrictive physiology. LV severely dilated LVIDd = 8.2 cm (up from 7.8cm in 2011) Moderate perivalvular mitral regurgitation. LA moderately to severely dilated. RV with mildly reduced function; PAPP .   Carotid Doppler 08/05/11: No significant extracranial carotid artery stenosis  CPX 08/11/11:   FVC 3.02 (65%) FEV1 2.43 (66%) FEV1/FVC 80% MVV 150 (95%) Peak VO2 17.3 ml/kg/min (57% predicted) VE/VCO2 slope 48.3,  pRER 1.08, Vent threshold 13.4% (predicted 44.4%) VE/MVV 61.7% - EKG with frequent PVCs during exercise  LHC: 08/15/11: no CAD RHC: 08/15/11 Aortic pressure 93/79 (mean 85) mm Hg  Left ventricle 91/16 mm Hg with end-diastolic pressure of 29 mm Hg  RA 9 RV 49/9/12 PA 50/30 (39) PCWP 35 Fick CO 3.3/1.45 PA oxygen saturation 53%  AO oxygen saturation 97%  RA oxygen saturation 56%  PVR 1.0 Woods  SVR 1837   He returns for follow up. He is being followed at [facility] for potential heart transplant. Was scheduled for repeat CPX in October at [facility] but now moved up to Sept 27. Also needs colonoscopy. Says he feels pretty decent. Good days and bad days. Able to do some riding mower. Cannot push mow. If goes to store can walk around as long as he goes slowly. Occasionally hits a rough spot and has to sit down for a period of time.  He is tolerating medications well.  Weight stable.  No SOB/PND or orthopnea.  Rarely gets dizziness when he stands up to fast.  No chest pain or shocks. We tried to increase Toprol in past but had to cut back due to symptomatic  hypotension.    ROS: All systems negative except as listed in HPI, PMH and Problem List.  Past Medical History  Diagnosis Date  . AICD (automatic cardioverter/defibrillator) present   . Hypertension   . Congestive heart failure   . Pneumonia   . Bronchitis     Current Outpatient Prescriptions  Medication Sig Dispense Refill  . aspirin 325 MG tablet Take 325 mg by mouth daily.      Marland Kitchen co-enzyme Q-10 30 MG capsule Take 30 mg by mouth 3 (three) times daily.      . digoxin (LANOXIN) 0.125 MG tablet Take 0.125 mg by mouth daily.      Marland Kitchen lisinopril (PRINIVIL,ZESTRIL) 10 MG tablet Take 10 mg in am and 5 mg in pm  45 tablet  3  . metoprolol succinate (TOPROL-XL) 25 MG 24 hr tablet Take 25 mg by mouth 2 (two) times daily.      Marland Kitchen omega-3 acid ethyl esters (LOVAZA) 1 G capsule Take 2 g by mouth 2 (two) times daily.      Marland Kitchen spironolactone (ALDACTONE) 25 MG tablet Take 25 mg by mouth daily.      Marland Kitchen DISCONTD: spironolactone (ALDACTONE) 25 MG tablet Take 0.5 tablets (12.5 mg total) by mouth daily.  30 tablet  3  . furosemide (LASIX) 40 MG tablet Take 1 tablet (40 mg total) by mouth 2 (two) times daily. Takes an additional tablet as needed for  fluid retention  270 tablet  3     PHYSICAL EXAM: Filed Vitals:   12/20/11 0936  BP: 104/88  Pulse: 88  Weight: 217 lb 12.8 oz (98.793 kg)  SpO2: 100%    General: Well appearing. No resp difficulty  HEENT: normal  Neck: supple. JVP flat Carotids 2+ bilat; no bruits. No lymphadenopathy or thryomegaly appreciated.  Cor: PMI laterally displaced. Regular rate & rhythm. 3/6 MR. +s3  Lungs: clear  Abdomen: soft, nontender, nondistended. No hepatosplenomegaly. No bruits or masses. Good bowel sounds.  Extremities: no cyanosis, clubbing, rash, edema  Neuro: alert & orientedx3, cranial nerves grossly intact. moves all 4 extremities w/o difficulty. Affect pleasant   ASSESSMENT & PLAN:

## 2011-12-20 NOTE — Assessment & Plan Note (Addendum)
Continue with NYHA III/IIIB symptoms. Volume status looks good. Continue with transplant work-up at [facility]. Will try to arrange colonoscopy for him prior to visit at [facility]. Will not titrate meds further at this time. Would decrease ASA to 81 mg daily.

## 2011-12-22 ENCOUNTER — Telehealth: Payer: Self-pay

## 2011-12-22 NOTE — Telephone Encounter (Signed)
Left message for patient to call back  

## 2011-12-22 NOTE — Telephone Encounter (Signed)
Message copied by Annett Fabian on Wed Dec 22, 2011  3:00 PM ------      Message from: Iva Boop      Created: Tue Dec 21, 2011  8:20 AM      Regarding: needs pre-visit or office visit to set up colonoscopy       Dr. Gala Romney is asking me to do a screening colonoscopy on this patient as part of heart transplant evaluation                  Office visit Thurs or next week  ok with me - will need a hospital colonoscopy due to AICD            Pre-visit could be ok - did not see anticoag on his med list            Whatever works best            Use Moviprep please

## 2011-12-22 NOTE — Telephone Encounter (Signed)
Patient is scheduled to see Dr. Leone Payor on Friday at 10:00

## 2011-12-24 ENCOUNTER — Encounter: Payer: Self-pay | Admitting: Internal Medicine

## 2011-12-24 ENCOUNTER — Telehealth: Payer: Self-pay | Admitting: Internal Medicine

## 2011-12-24 ENCOUNTER — Ambulatory Visit (INDEPENDENT_AMBULATORY_CARE_PROVIDER_SITE_OTHER): Payer: PRIVATE HEALTH INSURANCE | Admitting: Internal Medicine

## 2011-12-24 VITALS — BP 90/70 | HR 68 | Ht 74.0 in | Wt 217.0 lb

## 2011-12-24 DIAGNOSIS — I428 Other cardiomyopathies: Secondary | ICD-10-CM

## 2011-12-24 DIAGNOSIS — Z1211 Encounter for screening for malignant neoplasm of colon: Secondary | ICD-10-CM

## 2011-12-24 MED ORDER — MOVIPREP 100 G PO SOLR: 1.0000 | Freq: Once | ORAL | Status: DC

## 2011-12-24 NOTE — Progress Notes (Signed)
Subjective:    Patient ID: Robert [name], male    DOB: 1955/09/22, 56 y.o.   MRN: [id number]  HPI This is a very pleasant 56 year old [place] American man with a history notable for significant idiopathic congestive heart failure with associated pulmonary hypertension. He has dyspnea on exertion problems as well as some orthopnea. He is being considered for a heart transplant. He has never had screening colonoscopy. He has no active GI problems. When he was initially diagnosed a couple of years ago he had hemorrhoid problems and he has some anal tags her protrusions that bother him at times. He does not have bleeding.  No Known Allergies Outpatient Prescriptions Prior to Visit  Medication Sig Dispense Refill  . aspirin 325 MG tablet Take 325 mg by mouth daily.      Marland Kitchen co-enzyme Q-10 30 MG capsule Take 30 mg by mouth 3 (three) times daily.      . digoxin (LANOXIN) 0.125 MG tablet Take 0.125 mg by mouth daily.      . furosemide (LASIX) 40 MG tablet Take 1 tablet (40 mg total) by mouth 2 (two) times daily. Takes an additional tablet as needed for fluid retention  270 tablet  3  . lisinopril (PRINIVIL,ZESTRIL) 10 MG tablet Take 10 mg in am and 5 mg in pm  45 tablet  3  . metoprolol succinate (TOPROL-XL) 25 MG 24 hr tablet Take 25 mg by mouth 2 (two) times daily.      Marland Kitchen omega-3 acid ethyl esters (LOVAZA) 1 G capsule Take 2 g by mouth 2 (two) times daily.      Marland Kitchen spironolactone (ALDACTONE) 25 MG tablet Take 25 mg by mouth daily.       Past Medical History  Diagnosis Date  . AICD (automatic cardioverter/defibrillator) present   . Hypertension   . Congestive heart failure   . Pneumonia   . Bronchitis    Past Surgical History  Procedure Date  . Cardiac defibrillator placement   . Cardiac catheterization    History   Social History  . Marital Status: Single    Spouse Name: N/A    Number of Children: 2  . Years of Education: N/A   Occupational History  . Disabled    Social History  Main Topics  . Smoking status: Never Smoker   . Smokeless tobacco: Never Used  . Alcohol Use: No  . Drug Use: No    History reviewed. No pertinent family history.      Review of Systems As per history of present illness. Denies pedal edema or chest pain.    Objective:   Physical Exam General:  NAD Eyes:   anicteric Lungs:  clear Heart:  S1S2 with a soft holosystolic murmur and a positive S3  Abdomen:  soft and nontender, BS+ Ext:   no edema    Data Reviewed:  Lab Results  Component Value Date   WBC 7.0 10/31/2011   HGB 14.8 10/31/2011   HCT 43.0 10/31/2011   MCV 87.0 10/31/2011   PLT 133* 10/31/2011       Assessment & Plan:   1. Special screening for malignant neoplasms, colon   2. Other primary cardiomyopathies  AICD in place - being evaluated for heart transplant    1. Screening colonoscopy is appropriate in this individual. This will be performed at the hospital. I've explained that I might need to temporarily disabled his AICD. The risks and benefits as well as alternatives of endoscopic procedure(s) have been discussed  and reviewed. All questions answered. The patient agrees to proceed. Colonoscopy  scheduled for 12/29/2011   Cc: DAN BENSIMHON, MD

## 2011-12-24 NOTE — Patient Instructions (Addendum)
You have been scheduled for a colonoscopy at  Hospital.  Please follow written instructions given to you at your visit today.  Please pick up your prep kit at the pharmacy within the next 1-3 days. If you use inhalers (even only as needed), please bring them with you on the day of your procedure.  

## 2011-12-24 NOTE — Telephone Encounter (Signed)
Patient advised that Moviprep is the safest prep for him and I have mailed him a coupon for $20 rebate.

## 2011-12-24 NOTE — Telephone Encounter (Signed)
Phone is busy I will continue to try and reach the patient  

## 2011-12-29 ENCOUNTER — Ambulatory Visit (HOSPITAL_COMMUNITY)
Admission: RE | Admit: 2011-12-29 | Discharge: 2011-12-29 | Disposition: A | Discharge status: Home / Self Care | Payer: Medicare (Managed Care) | Source: Ambulatory Visit | Attending: Internal Medicine | Admitting: Internal Medicine

## 2011-12-29 ENCOUNTER — Encounter (HOSPITAL_COMMUNITY)
Admission: RE | Disposition: A | Discharge status: Home / Self Care | Payer: Self-pay | Source: Ambulatory Visit | Attending: Internal Medicine

## 2011-12-29 DIAGNOSIS — Z79899 Other long term (current) drug therapy: Secondary | ICD-10-CM | POA: Insufficient documentation

## 2011-12-29 DIAGNOSIS — Z95810 Presence of automatic (implantable) cardiac defibrillator: Secondary | ICD-10-CM | POA: Insufficient documentation

## 2011-12-29 DIAGNOSIS — I2789 Other specified pulmonary heart diseases: Secondary | ICD-10-CM | POA: Insufficient documentation

## 2011-12-29 DIAGNOSIS — Z1211 Encounter for screening for malignant neoplasm of colon: Secondary | ICD-10-CM | POA: Insufficient documentation

## 2011-12-29 DIAGNOSIS — N402 Nodular prostate without lower urinary tract symptoms: Secondary | ICD-10-CM | POA: Insufficient documentation

## 2011-12-29 DIAGNOSIS — I509 Heart failure, unspecified: Secondary | ICD-10-CM | POA: Insufficient documentation

## 2011-12-29 DIAGNOSIS — I428 Other cardiomyopathies: Secondary | ICD-10-CM

## 2011-12-29 DIAGNOSIS — K644 Residual hemorrhoidal skin tags: Secondary | ICD-10-CM | POA: Insufficient documentation

## 2011-12-29 HISTORY — PX: COLONOSCOPY: SHX5424

## 2011-12-29 SURGERY — COLONOSCOPY
Anesthesia: Moderate Sedation

## 2011-12-29 MED ORDER — MIDAZOLAM HCL 10 MG/2ML IJ SOLN
INTRAMUSCULAR | Status: AC
  Filled 2011-12-29: qty 2

## 2011-12-29 MED ORDER — FENTANYL CITRATE 0.05 MG/ML IJ SOLN
INTRAMUSCULAR | Status: AC
  Filled 2011-12-29: qty 2

## 2011-12-29 MED ORDER — MIDAZOLAM HCL 5 MG/5ML IJ SOLN
INTRAMUSCULAR | Status: DC | PRN
  Administered 2011-12-29: 2 mg via INTRAVENOUS
  Administered 2011-12-29: 1.5 mg via INTRAVENOUS
  Administered 2011-12-29 (×2): 2 mg via INTRAVENOUS

## 2011-12-29 MED ORDER — FENTANYL CITRATE 0.05 MG/ML IJ SOLN
INTRAMUSCULAR | Status: DC | PRN
  Administered 2011-12-29 (×3): 25 ug via INTRAVENOUS

## 2011-12-29 MED ORDER — SODIUM CHLORIDE 0.9 % IV SOLN
INTRAVENOUS | Status: DC
  Administered 2011-12-29: 500 mL via INTRAVENOUS

## 2011-12-29 NOTE — H&P (View-Only) (Signed)
Subjective:    Patient ID: Robert [name], male    DOB: 07/21/1955, 56 y.o.   MRN: [id number]  HPI This is a very pleasant 56-year-old African American man with a history notable for significant idiopathic congestive heart failure with associated pulmonary hypertension. He has dyspnea on exertion problems as well as some orthopnea. He is being considered for a heart transplant. He has never had screening colonoscopy. He has no active GI problems. When he was initially diagnosed a couple of years ago he had hemorrhoid problems and he has some anal tags her protrusions that bother him at times. He does not have bleeding.  No Known Allergies Outpatient Prescriptions Prior to Visit  Medication Sig Dispense Refill  . aspirin 325 MG tablet Take 325 mg by mouth daily.      . co-enzyme Q-10 30 MG capsule Take 30 mg by mouth 3 (three) times daily.      . digoxin (LANOXIN) 0.125 MG tablet Take 0.125 mg by mouth daily.      . furosemide (LASIX) 40 MG tablet Take 1 tablet (40 mg total) by mouth 2 (two) times daily. Takes an additional tablet as needed for fluid retention  270 tablet  3  . lisinopril (PRINIVIL,ZESTRIL) 10 MG tablet Take 10 mg in am and 5 mg in pm  45 tablet  3  . metoprolol succinate (TOPROL-XL) 25 MG 24 hr tablet Take 25 mg by mouth 2 (two) times daily.      . omega-3 acid ethyl esters (LOVAZA) 1 G capsule Take 2 g by mouth 2 (two) times daily.      . spironolactone (ALDACTONE) 25 MG tablet Take 25 mg by mouth daily.       Past Medical History  Diagnosis Date  . AICD (automatic cardioverter/defibrillator) present   . Hypertension   . Congestive heart failure   . Pneumonia   . Bronchitis    Past Surgical History  Procedure Date  . Cardiac defibrillator placement   . Cardiac catheterization    History   Social History  . Marital Status: Single    Spouse Name: N/A    Number of Children: 2  . Years of Education: N/A   Occupational History  . Disabled    Social History  Main Topics  . Smoking status: Never Smoker   . Smokeless tobacco: Never Used  . Alcohol Use: No  . Drug Use: No    History reviewed. No pertinent family history.      Review of Systems As per history of present illness. Denies pedal edema or chest pain.    Objective:   Physical Exam General:  NAD Eyes:   anicteric Lungs:  clear Heart:  S1S2 with a soft holosystolic murmur and a positive S3  Abdomen:  soft and nontender, BS+ Ext:   no edema    Data Reviewed:  Lab Results  Component Value Date   WBC 7.0 10/31/2011   HGB 14.8 10/31/2011   HCT 43.0 10/31/2011   MCV 87.0 10/31/2011   PLT 133* 10/31/2011       Assessment & Plan:   1. Special screening for malignant neoplasms, colon   2. Other primary cardiomyopathies  AICD in place - being evaluated for heart transplant    1. Screening colonoscopy is appropriate in this individual. This will be performed at the hospital. I've explained that I might need to temporarily disabled his AICD. The risks and benefits as well as alternatives of endoscopic procedure(s) have been discussed   and reviewed. All questions answered. The patient agrees to proceed. Colonoscopy  scheduled for 12/29/2011   Cc: DAN BENSIMHON, MD 

## 2011-12-29 NOTE — Interval H&P Note (Signed)
History and Physical Interval Note:  12/29/2011 1:20 PM  Robert [name]  has presented today for surgery, with the diagnosis of Special screening for malignant neoplasms, colon [V76.51]  The various methods of treatment have been discussed with the patient and family. After consideration of risks, benefits and other options for treatment, the patient has consented to  Procedure(s) (LRB) with comments: COLONOSCOPY (N/A) as a surgical intervention .  The patient's history has been reviewed, patient examined, no change in status, stable for surgery.  I have reviewed the patient's chart and labs.  Questions were answered to the patient's satisfaction.     Stan Head, MD, [facility]

## 2011-12-29 NOTE — Op Note (Signed)
[facility] [address] [place], [address]   COLONOSCOPY PROCEDURE REPORT  PATIENT: Robert [name], Robert [name]  MR#: [id number] BIRTHDATE: [birth year] , 56  yrs. old GENDER: Male ENDOSCOPIST: Iva Boop, MD, [facility] REFERRED [id number] Lexine Baton, M.D. PROCEDURE DATE:  12/29/2011 PROCEDURE:   Colonoscopy, diagnostic ASA CLASS:   Class IV INDICATIONS:average risk screening. MEDICATIONS: Fentanyl 75 mcg IV, Versed, and Versed-Detailed 7.5 mg IV  DESCRIPTION OF PROCEDURE:   After the risks benefits and alternatives of the procedure were thoroughly explained, informed consent was obtained.  A digital rectal exam revealed ? of  nodule at left distal edge of  prostate. ? if separate from prostate vs. intrinsic - seems separated by a cleft..   The     endoscope was introduced through the anus and advanced to the cecum, which was identified by both the appendix and ileocecal valve. No adverse events experienced.   The quality of the prep was excellent, using MoviPrep  The instrument was then slowly withdrawn as the colon was fully examined.      COLON FINDINGS: A normal appearing cecum, ileocecal valve, and appendiceal orifice were identified.  The ascending, hepatic flexure, transverse, splenic flexure, descending, sigmoid colon and rectum appeared unremarkable.  No polyps or cancers were seen. Retroflexed views revealed no abnormalities. The time to cecum=2 minutes 0 seconds.  Withdrawal time=9 minutes 0 seconds.  The scope was withdrawn and the procedure completed. COMPLICATIONS: There were no complications.  ENDOSCOPIC IMPRESSION: Normal colon, excellent prep ? abnormal prostate exam - nodule at left lateral distal margin of prostate - separate vs. intrinsic - I cannot tell  RECOMMENDATIONS: Needs further evaluation of nodule near or on left lateral edge of prostate. Routine repeat colonoscopy every 10 years if health status permits  eSigned:  Iva Boop,  MD, [facility] 12/29/2011 2:17 PM   cc: Dolores Patty, MD and The Patient

## 2011-12-30 ENCOUNTER — Encounter (HOSPITAL_COMMUNITY): Payer: Self-pay

## 2011-12-30 ENCOUNTER — Encounter (HOSPITAL_COMMUNITY): Payer: Self-pay | Admitting: Internal Medicine

## 2012-02-17 ENCOUNTER — Encounter (HOSPITAL_COMMUNITY): Payer: Self-pay

## 2012-02-17 ENCOUNTER — Ambulatory Visit (HOSPITAL_COMMUNITY)
Admission: RE | Admit: 2012-02-17 | Discharge: 2012-02-17 | Disposition: A | Discharge status: Home / Self Care | Payer: Medicare (Managed Care) | Source: Ambulatory Visit | Attending: Internal Medicine | Admitting: Internal Medicine

## 2012-02-17 ENCOUNTER — Encounter (HOSPITAL_COMMUNITY): Payer: Self-pay | Admitting: Internal Medicine

## 2012-02-17 VITALS — BP 106/80 | HR 91 | Wt 216.0 lb

## 2012-02-17 DIAGNOSIS — I5022 Chronic systolic (congestive) heart failure: Secondary | ICD-10-CM | POA: Insufficient documentation

## 2012-02-17 MED ORDER — LISINOPRIL 10 MG PO TABS: 10.0000 mg | ORAL_TABLET | Freq: Two times a day (BID) | ORAL | Status: DC

## 2012-02-17 NOTE — Progress Notes (Signed)
Patient ID: Robert [name], male   DOB: 1956-03-29, 56 y.o.   MRN: [id number] Primary Cardiologist: Dr. 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 Cath 2011 showed normal coronaries.  Admitted in early May 2013 with ICD shocks due to VT.   ECHO 08/05/11:  LVEF 20% with diffuse hypokinesis with restrictive physiology. LV severely dilated LVIDd = 8.2 cm (up from 7.8cm in 2011) Moderate perivalvular mitral regurgitation. LA moderately to severely dilated. RV with mildly reduced function; PAPP .   Carotid Doppler 08/05/11: No significant extracranial carotid artery stenosis  CPX 08/11/11:   FVC 3.02 (65%) FEV1 2.43 (66%) FEV1/FVC 80% MVV 150 (95%) Peak VO2 17.3 ml/kg/min (57% predicted) VE/VCO2 slope 48.3,  pRER 1.08, Vent threshold 13.4% (predicted 44.4%) VE/MVV 61.7% - EKG with frequent PVCs during exercise  LHC: 08/15/11: no CAD RHC: 08/15/11 Aortic pressure 93/79 (mean 85) mm Hg  Left ventricle 91/16 mm Hg with end-diastolic pressure of 29 mm Hg  RA 9 RV 49/9/12 PA 50/30 (39) PCWP 35 Fick CO 3.3/1.45 PA oxygen saturation 53%  AO oxygen saturation 97%  RA oxygen saturation 56%  PVR 1.0 Woods  SVR 1837   He returns for follow up. Evaluated by [facility] heart transplant and he is not presently a candidate. He does not currently have plans for follow up. Overall he feels good.  Denies SOB/PND/Orthopnea. Weight at home 211-212. Rides stationary bike 15 minutes per day. Compliant with medications. Not currently working.     ROS: All systems negative except as listed in HPI, PMH and Problem List.  Past Medical History  Diagnosis Date  . AICD (automatic cardioverter/defibrillator) present   . Hypertension   . Congestive heart failure   . Pneumonia   . Bronchitis     Current Outpatient Prescriptions  Medication Sig Dispense Refill  .  aspirin 325 MG tablet Take 325 mg by mouth daily.      Marland Kitchen co-enzyme Q-10 30 MG capsule Take 30 mg by mouth 3 (three) times daily.      . digoxin (LANOXIN) 0.125 MG tablet Take 0.125 mg by mouth daily.      . furosemide (LASIX) 40 MG tablet Take 1 tablet (40 mg total) by mouth 2 (two) times daily. Takes an additional tablet as needed for fluid retention  270 tablet  3  . lisinopril (PRINIVIL,ZESTRIL) 10 MG tablet Take 10 mg in am and 5 mg in pm  45 tablet  3  . metoprolol succinate (TOPROL-XL) 25 MG 24 hr tablet Take 25 mg by mouth 2 (two) times daily.      Marland Kitchen omega-3 acid ethyl esters (LOVAZA) 1 G capsule Take 2 g by mouth 2 (two) times daily.      Marland Kitchen spironolactone (ALDACTONE) 25 MG tablet Take 25 mg by mouth daily.         PHYSICAL EXAM: Filed Vitals:   02/17/12 1136  BP: 106/80  Pulse: 91  Weight: 216 lb (97.977 kg)  SpO2: 100%    General: Well appearing. No resp difficulty  HEENT: normal  Neck: supple. JVP flat Carotids 2+ bilat; no bruits. No lymphadenopathy or thryomegaly appreciated.  Cor: PMI laterally displaced. Regular rate & rhythm. 3/6 MR. +s3  Lungs: clear  Abdomen: soft, nontender, nondistended. No hepatosplenomegaly. No bruits or masses. Good bowel sounds.  Extremities: no cyanosis, clubbing, rash, edema  Neuro: alert & orientedx3, cranial nerves grossly intact. moves all 4 extremities w/o difficulty. Affect pleasant   ASSESSMENT & PLAN:

## 2012-02-17 NOTE — Assessment & Plan Note (Signed)
We are awaiting records from [facility]. Apparently he had a repeat RHC and numbers looked better so he was not listed. Functionally appears to be doing some better NYHA III-IIIB. On exam has bad MR and prominent s3 so we will need to follow very closely. Will titrate lisinopril to 10 bid. Unable to tolerate titration of Toprol in past. I have called Dr. Allena Katz at [facility] to discuss his plan for f/u with their Transplant clinic.

## 2012-02-17 NOTE — Patient Instructions (Addendum)
Follow up in 6 weeks  Take lisinopril 10 mg twice a day  Do the following things EVERYDAY: 1) Weigh yourself in the morning before breakfast. Write it down and keep it in a log. 2) Take your medicines as prescribed 3) Eat low salt foods-Limit salt (sodium) to 2000 mg per day.  4) Stay as active as you can everyday 5) Limit all fluids for the day to less than 2 liters

## 2012-03-30 ENCOUNTER — Encounter: Payer: Self-pay | Admitting: Physician Assistant

## 2012-03-30 ENCOUNTER — Ambulatory Visit (HOSPITAL_COMMUNITY)
Admission: RE | Admit: 2012-03-30 | Discharge: 2012-03-30 | Disposition: A | Discharge status: Home / Self Care | Payer: PRIVATE HEALTH INSURANCE | Source: Ambulatory Visit | Attending: Internal Medicine | Admitting: Internal Medicine

## 2012-03-30 VITALS — BP 96/72 | HR 80 | Wt 218.5 lb

## 2012-03-30 DIAGNOSIS — I472 Ventricular tachycardia, unspecified: Secondary | ICD-10-CM | POA: Insufficient documentation

## 2012-03-30 DIAGNOSIS — I4729 Other ventricular tachycardia: Secondary | ICD-10-CM | POA: Insufficient documentation

## 2012-03-30 DIAGNOSIS — I5022 Chronic systolic (congestive) heart failure: Secondary | ICD-10-CM | POA: Insufficient documentation

## 2012-03-30 MED ORDER — EPLERENONE 25 MG PO TABS: 25.0000 mg | ORAL_TABLET | Freq: Every day | ORAL | Status: DC

## 2012-03-30 MED ORDER — EPLERENONE 50 MG PO TABS: 25.0000 mg | ORAL_TABLET | Freq: Every day | ORAL | Status: DC

## 2012-03-30 NOTE — Assessment & Plan Note (Addendum)
By his report he is NYHA II-III but I think he is minimizing his sx. On exam continues with prominent s3 and MR murmur. Volume status looks good. BP remains soft. He also continues to have frequent NSVT. I have discussed with Dr. Allena Katz in the Transplant Clinic at [facility] and I think he will likely need to be listed for transplant in the near future. Will repeat CPX testing in the next 2 weeks. Will switch spiro to eplerenone due to gynecomastia.  Otherwise will not titrate meds as he has had symptomatic hypotension in recent past.

## 2012-03-30 NOTE — Progress Notes (Signed)
Patient ID: Robert [name], male   DOB: 08/17/1955, 56 y.o.   MRN: [id number] Primary Cardiologist: Dr. 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 Cath 2011 showed normal coronaries.  Admitted in early May 2013 with ICD shocks due to VT.   ECHO 08/05/11:  LVEF 20% with diffuse hypokinesis with restrictive physiology. LV severely dilated LVIDd = 8.2 cm (up from 7.8cm in 2011) Moderate perivalvular mitral regurgitation. LA moderately to severely dilated. RV with mildly reduced function; PAPP .   Carotid Doppler 08/05/11: No significant extracranial carotid artery stenosis  CPX 08/11/11:   FVC 3.02 (65%) FEV1 2.43 (66%) FEV1/FVC 80% MVV 150 (95%) Peak VO2 17.3 ml/kg/min (57% predicted) VE/VCO2 slope 48.3,  pRER 1.08, Vent threshold 13.4% (predicted 44.4%) VE/MVV 61.7% - EKG with frequent PVCs during exercise  LHC: 08/15/11: no CAD RHC: 08/15/11 Aortic pressure 93/79 (mean 85) mm Hg  Left ventricle 91/16 mm Hg with end-diastolic pressure of 29 mm Hg  RA 9 RV 49/9/12 PA 50/30 (39) PCWP 35 Fick CO 3.3/1.45 PA oxygen saturation 53%  AO oxygen saturation 97%  RA oxygen saturation 56%  PVR 1.0 Woods  SVR 1837  01/13/2012 ECHO EF 15% at [facility]  He returns for follow. Overall feels the same. Says he can do most things he wants to do if he takes his time. Can walk around store without stopping.  Last week had episode of diaphoresis and bilateral shoulder pain which resolved after a minute or two. No palpitations or syncope. Complains of left gynecomastia. He Denies PND/Orthopnea/CP. No shocks. Compliant with medications.  Seen at [facility] a few months ago and transplant work-up was put on hold as they felt he was doing ok.  Medtronic ICD Interrogated in clinic. Optivol up and down but not crossing threshold. Since August 2013 has had 160 episodes of  NSVT - longest 8 seconds. No ATP or shock required.   ROS: All systems negative except as listed in HPI, PMH and Problem List.  Past Medical History  Diagnosis Date  . AICD (automatic cardioverter/defibrillator) present   . Hypertension   . Congestive heart failure   . Pneumonia   . Bronchitis     Current Outpatient Prescriptions  Medication Sig Dispense Refill  . aspirin 325 MG tablet Take 325 mg by mouth daily.      Marland Kitchen co-enzyme Q-10 30 MG capsule Take 30 mg by mouth 3 (three) times daily.      . digoxin (LANOXIN) 0.125 MG tablet Take 0.125 mg by mouth daily.      . furosemide (LASIX) 40 MG tablet Take 1 tablet (40 mg total) by mouth 2 (two) times daily. Takes an additional tablet as needed for fluid retention  270 tablet  3  . lisinopril (PRINIVIL,ZESTRIL) 10 MG tablet Take 1 tablet (10 mg total) by mouth 2 (two) times daily.  60 tablet  3  . metoprolol succinate (TOPROL-XL) 25 MG 24 hr tablet Take 25 mg by mouth 2 (two) times daily.      Marland Kitchen omega-3 acid ethyl esters (LOVAZA) 1 G capsule Take 2 g by mouth 2 (two) times daily.      Marland Kitchen eplerenone (INSPRA) 50 MG tablet Take 0.5 tablets (25 mg total) by mouth daily.  15 tablet  3     PHYSICAL EXAM: Filed Vitals:   03/30/12 1347  BP: 96/72  Pulse: 80  Weight: 218 lb 8 oz (99.111 kg)  SpO2: 96%    General: Well appearing. No resp difficulty  HEENT: normal  Neck: supple. JVP flat Carotids 2+ bilat; no bruits. No lymphadenopathy or thryomegaly appreciated.  Cor: PMI laterally displaced. Regular rate & rhythm. 3/6 MR. +s3  Lungs: clear  Abdomen: soft, nontender, nondistended. No hepatosplenomegaly. No bruits or masses. Good bowel sounds.  Extremities: no cyanosis, clubbing, rash, edema  Neuro: alert & orientedx3, cranial nerves grossly intact. moves all 4 extremities w/o difficulty. Affect pleasant   ASSESSMENT & PLAN:

## 2012-03-30 NOTE — Patient Instructions (Addendum)
Stop Spironolactone  Take Eplerenone 25 mg daily (1/2 tab)  Schedule CPX follow up in 1 month.  Do the following things EVERYDAY: 1) Weigh yourself in the morning before breakfast. Write it down and keep it in a log. 2) Take your medicines as prescribed 3) Eat low salt foods-Limit salt (sodium) to 2000 mg per day.  4) Stay as active as you can everyday 5) Limit all fluids for the day to less than 2 liters

## 2012-03-30 NOTE — Assessment & Plan Note (Addendum)
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 No prolonged episodes.

## 2012-04-05 DIAGNOSIS — Z95811 Presence of heart assist device: Secondary | ICD-10-CM

## 2012-04-05 HISTORY — DX: Presence of heart assist device: Z95.811

## 2012-04-19 ENCOUNTER — Ambulatory Visit (HOSPITAL_COMMUNITY): Payer: Medicare (Managed Care) | Attending: Adult Health

## 2012-04-19 DIAGNOSIS — I5022 Chronic systolic (congestive) heart failure: Secondary | ICD-10-CM | POA: Insufficient documentation

## 2012-05-01 ENCOUNTER — Ambulatory Visit (HOSPITAL_COMMUNITY): Admission: RE | Admit: 2012-05-01 | Payer: Medicare (Managed Care) | Source: Ambulatory Visit

## 2012-05-05 ENCOUNTER — Other Ambulatory Visit (HOSPITAL_COMMUNITY): Payer: Self-pay | Admitting: *Deleted

## 2012-05-05 MED ORDER — LISINOPRIL 10 MG PO TABS: 10.0000 mg | ORAL_TABLET | Freq: Two times a day (BID) | ORAL | Status: DC

## 2012-05-08 ENCOUNTER — Encounter: Payer: Self-pay | Admitting: Internal Medicine

## 2012-05-12 ENCOUNTER — Telehealth (HOSPITAL_COMMUNITY): Payer: Self-pay | Admitting: Cardiology

## 2012-05-12 ENCOUNTER — Inpatient Hospital Stay (HOSPITAL_COMMUNITY)
Admission: AD | Admit: 2012-05-12 | Discharge: 2012-05-14 | DRG: 544 | Disposition: A | Discharge status: Home / Self Care | Payer: BC Managed Care – PPO | Source: Ambulatory Visit | Attending: Internal Medicine | Admitting: Internal Medicine

## 2012-05-12 ENCOUNTER — Encounter (HOSPITAL_COMMUNITY): Payer: Self-pay | Admitting: *Deleted

## 2012-05-12 ENCOUNTER — Inpatient Hospital Stay (HOSPITAL_COMMUNITY): Payer: BC Managed Care – PPO

## 2012-05-12 DIAGNOSIS — I10 Essential (primary) hypertension: Secondary | ICD-10-CM | POA: Diagnosis present

## 2012-05-12 DIAGNOSIS — I4729 Other ventricular tachycardia: Secondary | ICD-10-CM | POA: Diagnosis present

## 2012-05-12 DIAGNOSIS — I472 Ventricular tachycardia, unspecified: Secondary | ICD-10-CM | POA: Diagnosis present

## 2012-05-12 DIAGNOSIS — I428 Other cardiomyopathies: Secondary | ICD-10-CM

## 2012-05-12 DIAGNOSIS — Z7902 Long term (current) use of antithrombotics/antiplatelets: Secondary | ICD-10-CM

## 2012-05-12 DIAGNOSIS — I509 Heart failure, unspecified: Principal | ICD-10-CM | POA: Diagnosis present

## 2012-05-12 DIAGNOSIS — R55 Syncope and collapse: Secondary | ICD-10-CM

## 2012-05-12 DIAGNOSIS — I5033 Acute on chronic diastolic (congestive) heart failure: Secondary | ICD-10-CM | POA: Diagnosis present

## 2012-05-12 DIAGNOSIS — Z7982 Long term (current) use of aspirin: Secondary | ICD-10-CM

## 2012-05-12 DIAGNOSIS — Z79899 Other long term (current) drug therapy: Secondary | ICD-10-CM

## 2012-05-12 DIAGNOSIS — I43 Cardiomyopathy in diseases classified elsewhere: Secondary | ICD-10-CM | POA: Diagnosis present

## 2012-05-12 DIAGNOSIS — N179 Acute kidney failure, unspecified: Secondary | ICD-10-CM | POA: Diagnosis present

## 2012-05-12 DIAGNOSIS — E871 Hypo-osmolality and hyponatremia: Secondary | ICD-10-CM | POA: Diagnosis present

## 2012-05-12 DIAGNOSIS — Z95810 Presence of automatic (implantable) cardiac defibrillator: Secondary | ICD-10-CM

## 2012-05-12 DIAGNOSIS — Z8679 Personal history of other diseases of the circulatory system: Secondary | ICD-10-CM

## 2012-05-12 DIAGNOSIS — I110 Hypertensive heart disease with heart failure: Principal | ICD-10-CM | POA: Diagnosis present

## 2012-05-12 DIAGNOSIS — I5023 Acute on chronic systolic (congestive) heart failure: Secondary | ICD-10-CM

## 2012-05-12 LAB — CBC WITH DIFFERENTIAL/PLATELET
Eosinophils Absolute: 0 10*3/uL (ref 0.0–0.7)
Eosinophils Relative: 0 % (ref 0–5)
HCT: 42.7 % (ref 39.0–52.0)
Hemoglobin: 15 g/dL (ref 13.0–17.0)
Lymphocytes Relative: 7 % — ABNORMAL LOW (ref 12–46)
Lymphs Abs: 0.8 10*3/uL (ref 0.7–4.0)
MCH: 30.8 pg (ref 26.0–34.0)
MCV: 87.7 fL (ref 78.0–100.0)
Monocytes Absolute: 0.8 10*3/uL (ref 0.1–1.0)
Monocytes Relative: 6 % (ref 3–12)
Platelets: 185 10*3/uL (ref 150–400)
RBC: 4.87 MIL/uL (ref 4.22–5.81)

## 2012-05-12 LAB — COMPREHENSIVE METABOLIC PANEL
BUN: 31 mg/dL — ABNORMAL HIGH (ref 6–23)
CO2: 24 mEq/L (ref 19–32)
Calcium: 9.4 mg/dL (ref 8.4–10.5)
Creatinine, Ser: 1.4 mg/dL — ABNORMAL HIGH (ref 0.50–1.35)
GFR calc Af Amer: 63 mL/min — ABNORMAL LOW (ref >90)
GFR calc non Af Amer: 55 mL/min — ABNORMAL LOW (ref >90)
Glucose, Bld: 116 mg/dL — ABNORMAL HIGH (ref 70–99)
Total Protein: 7.5 g/dL (ref 6.0–8.3)

## 2012-05-12 LAB — CARBOXYHEMOGLOBIN
Methemoglobin: 1 % (ref 0.0–1.5)
O2 Saturation: 85.1 %
O2 Saturation: 57.3 %
Total hemoglobin: 15.1 g/dL (ref 13.5–18.0)

## 2012-05-12 LAB — MAGNESIUM: Magnesium: 2.4 mg/dL (ref 1.5–2.5)

## 2012-05-12 MED ORDER — ONDANSETRON HCL 4 MG/2ML IJ SOLN: 4.0000 mg | Freq: Four times a day (QID) | INTRAMUSCULAR | Status: DC | PRN

## 2012-05-12 MED ORDER — HEPARIN SODIUM (PORCINE) 5000 UNIT/ML IJ SOLN
5000.0000 [IU] | Freq: Three times a day (TID) | INTRAMUSCULAR | Status: DC
  Administered 2012-05-12 – 2012-05-14 (×6): 5000 [IU] via SUBCUTANEOUS
  Filled 2012-05-12 (×9): qty 1

## 2012-05-12 MED ORDER — SODIUM CHLORIDE 0.9 % IV SOLN
250.0000 mL | INTRAVENOUS | Status: DC | PRN
  Administered 2012-05-12: 250 mL via INTRAVENOUS

## 2012-05-12 MED ORDER — MILRINONE IN DEXTROSE 20 MG/100ML IV SOLN
0.2500 ug/kg/min | INTRAVENOUS | Status: DC
  Administered 2012-05-12 – 2012-05-14 (×4): 0.25 ug/kg/min via INTRAVENOUS
  Filled 2012-05-12 (×4): qty 100

## 2012-05-12 MED ORDER — SODIUM CHLORIDE 0.9 % IJ SOLN
3.0000 mL | Freq: Two times a day (BID) | INTRAMUSCULAR | Status: DC
  Administered 2012-05-12 – 2012-05-14 (×3): 3 mL via INTRAVENOUS

## 2012-05-12 MED ORDER — DIGOXIN 125 MCG PO TABS
0.1250 mg | ORAL_TABLET | Freq: Every day | ORAL | Status: DC
  Administered 2012-05-13 – 2012-05-14 (×2): 0.125 mg via ORAL
  Filled 2012-05-12 (×3): qty 1

## 2012-05-12 MED ORDER — ASPIRIN EC 325 MG PO TBEC
325.0000 mg | DELAYED_RELEASE_TABLET | Freq: Every day | ORAL | Status: DC
  Administered 2012-05-13: 325 mg via ORAL
  Filled 2012-05-12 (×2): qty 1

## 2012-05-12 MED ORDER — LISINOPRIL 10 MG PO TABS
10.0000 mg | ORAL_TABLET | Freq: Two times a day (BID) | ORAL | Status: DC
  Filled 2012-05-12: qty 1

## 2012-05-12 MED ORDER — ACETAMINOPHEN 325 MG PO TABS: 650.0000 mg | ORAL_TABLET | ORAL | Status: DC | PRN

## 2012-05-12 MED ORDER — SODIUM CHLORIDE 0.9 % IJ SOLN: 3.0000 mL | INTRAMUSCULAR | Status: DC | PRN

## 2012-05-12 NOTE — H&P (Addendum)
Advanced Heart Failure Team History and Physical Note   Primary Cardiologist:  Dr. 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 Cath 2013 showed normal coronaries.  Right heart cath was also completed at that time showing low cardiac output.    He is followed very closely in Heart Failure Clinic.  In December he was referred to the Transplant Clinic at [facility] as his symptoms began to worsen, we were unable to titrate meds due to hypotension and frequent NSVT.  He underwent transplant evaluation, RHC showed low output with high filling pressures.  He was diuresed and placed on home inotropes.  He was feeling improvement with milrinone at home until the last couple of days.  Two days ago he experienced a fluttering sensation with presyncopal symptoms proceed by a near syncopal episode the following day.  He walked slowly around the yard today but after completion became nauseous and diaphoretic.  He called the clinic describing his symptoms and he has been admitted for symptoms concerning for low output.     Review of Systems: [y] = yes, [ ]  = no   General: Weight gain [ ] ; Weight loss [ ] ; Anorexia [ ] ; Fatigue Cove.Etienne ]; Fever [ ] ; Chills [ ] ; Weakness [ ]   Cardiac: Chest pain/pressure [ ] ; Resting SOB [ ] ; Exertional SOB [ y]; Orthopnea [ ] ; Pedal Edema [ ] ; Palpitations [ ] ; Syncope Cove.Etienne ]; Presyncope [ y]; Paroxysmal nocturnal dyspnea[ ]   Pulmonary: Cough [ ] ; Wheezing[ ] ; Hemoptysis[ ] ; Sputum [ ] ; Snoring [ ]   GI: Vomiting[y ]; Dysphagia[ ] ; Melena[ ] ; Hematochezia [ ] ; Heartburn[ ] ; Abdominal pain [ ] ; Constipation [ ] ; Diarrhea [ ] ; BRBPR [ ]   GU: Hematuria[ ] ; Dysuria [ ] ; Nocturia[ ]   Vascular: Pain in legs with walking [ ] ; Pain in feet with lying flat [ ] ; Non-healing sores [  ]; Stroke [ ] ; TIA [ ] ; Slurred speech [ ] ;  Neuro: Headaches[ ] ; Vertigo[ ] ; Seizures[ ] ; Paresthesias[ ] ;Blurred vision [ ] ; Diplopia [ ] ; Vision changes [ ]   Ortho/Skin: Arthritis [ ] ; Joint pain [ ] ; Muscle pain [ ] ; Joint swelling [ ] ; Back Pain [ ] ; Rash [ ]   Psych: Depression[ ] ; Anxiety[ ]   Heme: Bleeding problems [ ] ; Clotting disorders [ ] ; Anemia [ ]   Endocrine: Diabetes [ ] ; Thyroid dysfunction[ ]   Home Medications Prior to Admission medications   Medication Sig Start Date End Date Taking? Authorizing Provider  aspirin 325 MG tablet Take 325 mg by mouth daily. 08/06/11 08/05/12  Clanford Cyndie Mull, MD  co-enzyme Q-10 30 MG capsule Take 30 mg by mouth 3 (three) times daily.    Historical Provider, MD  digoxin (LANOXIN) 0.125 MG tablet Take 0.125 mg by mouth daily. 08/11/11 08/10/12  Hadassah Pais, PA  eplerenone (INSPRA) 50 MG tablet Take 0.5 tablets (25 mg total) by mouth daily. 03/30/12   Amy D Filbert Schilder, NP  furosemide (LASIX) 40 MG tablet Take 1 tablet (40 mg total) by mouth 2 (two) times daily. Takes an additional tablet as needed for fluid retention 11/29/11   Hadassah Pais, PA  lisinopril (PRINIVIL,ZESTRIL) 10 MG tablet Take 1 tablet (10 mg total) by mouth 2 (two) times daily. 05/05/12   Dolores Patty, MD  metoprolol succinate (TOPROL-XL) 25 MG 24 hr tablet Take 25 mg by mouth 2 (two) times daily. 10/13/11   [name], NP  omega-3 acid ethyl esters (LOVAZA) 1 G capsule Take 2 g by mouth 2 (two) times daily.    Historical Provider, MD    Past Medical History: Past Medical History  Diagnosis Date  . AICD (automatic cardioverter/defibrillator) present   . Hypertension   . Congestive heart failure   . Pneumonia   . Bronchitis     Past Surgical History: Past Surgical History  Procedure Date  . Cardiac defibrillator placement   . Cardiac catheterization   . Colonoscopy 12/29/2011    Procedure: COLONOSCOPY;  Surgeon: Iva Boop, MD;  Location: [facility];  Service:  Endoscopy;  Laterality: N/A;    Family History: No family history on file.  Social History: History   Social History  . Marital Status: Single    Spouse Name: N/A    Number of Children: 2  . Years of Education: N/A   Occupational History  . Disabled    Social History Main Topics  . Smoking status: Never Smoker   . Smokeless tobacco: Never Used  . Alcohol Use: No  . Drug Use: No  . Sexually Active: Not on file   Other Topics Concern  . Not on file   Social History Narrative  . No narrative on file    Allergies:  No Known Allergies  Objective:    Vital Signs:       Filed Weights   05/12/12 1633  Weight: 218 lb 7.6 oz (99.1 kg)    Physical Exam: General: Fatigued appearing. No resp difficulty  HEENT: normal  Neck: supple. JVP 7-8. Carotids 2+ bilat; no bruits. No lymphadenopathy or thryomegaly appreciated.  Cor: PMI laterally displaced. Regular rate & rhythm. 3/6 MR. +s3  Lungs: clear  Abdomen: soft, nontender, nondistended. No hepatosplenomegaly. No bruits or masses. Good bowel sounds.  Extremities: no cyanosis, clubbing, rash, edema  Neuro: alert & orientedx3, cranial nerves grossly intact. moves all 4 extremities w/o difficulty. Affect pleasant   Assessment:   1. Chronic systolic heart failure requiring home inotropes 2. NICM, EF 20%     --s/p Medtronic ICD 3. Syncope 4. H/o VT  Plan/Discussion:    Robert [name] presents with symptoms concerning for low output heart failure despite home milrinone therapy.  Will admit to step down.  Follow CVP and co-ox.  He may require repeat RHC +/- IABP placement.  Will discuss case with Transplant Team at [facility].    Will get device interrogated with history of VT.     Robbi Garter, [facility] 05/12/2012, 4:16 PM  Patient seen and examined with Ulyess Blossom, PA-C. We discussed all aspects of the encounter. I agree with the assessment and plan as stated above.   I worry he may have recurrent low-output physiology and  VT. Admit to stepdown. Check Co-ox, CVP and interrogate ICD. Continue milrinone. May need repeat RHC.  Waneda Klammer,MD 5:44 PM  Addendum:  ICD interrogated. Multiple brief runs NSVT < 5 secs. Nothing sustained. Optivol was elevated but now down after diuresis at [facility].   Arleigh Odowd,MD 5:45 PM  CVP reading 40 on PICC. On exam looks more like 7-8. We changed set-up 3 times without improvement. CXR shows normal placement. Echo with LVEF 5%. RV only mildly HK. No tamponade.  Emory Leaver,MD 6:10 PM

## 2012-05-12 NOTE — Telephone Encounter (Signed)
Pt called with C/O increased fatigue, nausea, syncopal episode 05/11/12, decreased B/P (100/50). Pt is on home Milrinone. Can this be a side effect of new medication?---metoprolol    Per VO Ulyess Blossom, [place] and Dr. Gala Romney Pt should come to hospital to be admitted and further evaluation will be done at that time.  Pt aware and voiced understanding

## 2012-05-12 NOTE — Progress Notes (Signed)
CVP doesn't seem to be accurate after attempting to change out transducer x 3.  Dr. Gala Romney aware and ok with holding off on cvp monitoring for the night.

## 2012-05-13 LAB — BASIC METABOLIC PANEL
CO2: 27 mEq/L (ref 19–32)
Calcium: 8.7 mg/dL (ref 8.4–10.5)
GFR calc non Af Amer: 68 mL/min — ABNORMAL LOW (ref >90)
Glucose, Bld: 147 mg/dL — ABNORMAL HIGH (ref 70–99)
Potassium: 3.7 mEq/L (ref 3.5–5.1)
Sodium: 130 mEq/L — ABNORMAL LOW (ref 135–145)

## 2012-05-13 LAB — CARBOXYHEMOGLOBIN
Carboxyhemoglobin: 0.8 % (ref 0.5–1.5)
O2 Saturation: 51.1 %
Total hemoglobin: 13.1 g/dL — ABNORMAL LOW (ref 13.5–18.0)

## 2012-05-13 MED ORDER — METOPROLOL SUCCINATE ER 25 MG PO TB24
25.0000 mg | ORAL_TABLET | Freq: Every day | ORAL | Status: DC
  Administered 2012-05-14: 25 mg via ORAL
  Filled 2012-05-13: qty 1

## 2012-05-13 MED ORDER — TORSEMIDE 20 MG PO TABS
20.0000 mg | ORAL_TABLET | Freq: Two times a day (BID) | ORAL | Status: DC
  Administered 2012-05-13 – 2012-05-14 (×2): 20 mg via ORAL
  Filled 2012-05-13 (×5): qty 1

## 2012-05-13 MED ORDER — ASPIRIN EC 81 MG PO TBEC
81.0000 mg | DELAYED_RELEASE_TABLET | Freq: Every day | ORAL | Status: DC
  Administered 2012-05-14: 81 mg via ORAL
  Filled 2012-05-13: qty 1

## 2012-05-13 MED ORDER — EPLERENONE 25 MG PO TABS
25.0000 mg | ORAL_TABLET | Freq: Every day | ORAL | Status: DC
  Administered 2012-05-13 – 2012-05-14 (×2): 25 mg via ORAL
  Filled 2012-05-13 (×2): qty 1

## 2012-05-13 MED ORDER — NON FORMULARY: 25.0000 mg | Freq: Every day | Status: DC

## 2012-05-13 MED ORDER — METOPROLOL SUCCINATE ER 25 MG PO TB24
25.0000 mg | ORAL_TABLET | Freq: Every day | ORAL | Status: DC
  Administered 2012-05-13: 25 mg via ORAL
  Filled 2012-05-13: qty 1

## 2012-05-13 NOTE — Progress Notes (Addendum)
Advanced Heart Failure Team History and Physical Note   Primary Cardiologist:  Dr. Royann Shivers  Reason for Admission: Questionable low output heart failure  HPI:    Mr. 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 Cath 2013 showed normal coronaries.   He is followed very closely in Heart Failure Clinic.  In December he was referred to the Transplant Clinic at [facility] as his symptoms began to worsen, we were unable to titrate meds due to hypotension and frequent NSVT.  He underwent transplant evaluation, RHC showed low output with high filling pressures.  He was diuresed, b-blocker increased and placed on home inotropes.    Admitted 2/7 with syncopal episode and worsening HF symptoms. ICD interrogation with multiple very short runs NSVT. No prolonged episodes. Co-ox 57%-> 51%. Cr improved 1.4->1.2  B-blocker held overnight. Feels much better. Though BP dropped overnight and he felt bad. CVP readings off PICC still high but these appear inaccurate.   Allergies:  No Known Allergies  Objective:    Vital Signs:   Temp:  [97.8 F (36.6 C)-98.5 F (36.9 C)] 98 F (36.7 C) (02/08 1202) Pulse Rate:  [69-105] 105 (02/08 0316) Resp:  [14-18] 16 (02/08 1202) BP: (80-106)/(50-84) 101/65 mmHg (02/08 0316) SpO2:  [96 %-100 %] 98 % (02/08 0316) Weight:  [91 kg (200 lb 9.9 oz)-99.1 kg (218 lb 7.6 oz)] 91 kg (200 lb 9.9 oz) (02/08 0316) Last BM Date: 05/12/12 Filed Weights   05/12/12 1633 05/12/12 1723 05/13/12 0316  Weight: 99.1 kg (218 lb 7.6 oz) 99.1 kg (218 lb 7.6 oz) 91 kg (200 lb 9.9 oz)    Physical Exam: General: Fatigued appearing. No resp difficulty  HEENT: normal  Neck: supple. JVP 7-8. Carotids 2+ bilat; no bruits. No lymphadenopathy or thryomegaly appreciated.  Cor: PMI laterally displaced. Regular rate & rhythm. 3/6 MR. +s3  Lungs: clear  Abdomen: soft,  nontender, nondistended. No hepatosplenomegaly. No bruits or masses. Good bowel sounds.  Extremities: no cyanosis, clubbing, rash, edema  Neuro: alert & orientedx3, cranial nerves grossly intact. moves all 4 extremities w/o difficulty. Affect pleasant   Assessment:   1. Chronic systolic heart failure requiring home inotropes     --on transplant list at [facility] status Ib (blood type O) 2. NICM, EF 20%     --s/p Medtronic ICD 3. Syncope 4. H/o VT 5. NSVT 6. Acute renal failure  Plan/Discussion:    Improved though co-ox and BP still quite marginal. Volume status looks fine. I suspect symptoms related primarily to increase of b-blocker. Will restart diuretics. As well as low-dose b-blocker due to NSVT and watch at least another 24 hours following co-ox closely. Hold ace-I due to hypotension. If numbers not better will likely need RHC on Monday with eye toward bridge LVAD in the near future.     Reuel Boom Bensimhon,MD 12:36 PM

## 2012-05-13 NOTE — Progress Notes (Signed)
Pt had 14 beat run VT.  Been having brief runs of NSVT.  Labs drawn Potassium 3.7.  Pt states "this is what I've been feeling.  Starts around 0200 AM  Goes to around 0400". Been feeling fluttering in chest.   Md already aware of NSVT.  Will continue to monitor. Emilie [place] Liter

## 2012-05-14 DIAGNOSIS — N179 Acute kidney failure, unspecified: Secondary | ICD-10-CM

## 2012-05-14 DIAGNOSIS — Z8679 Personal history of other diseases of the circulatory system: Secondary | ICD-10-CM

## 2012-05-14 LAB — BASIC METABOLIC PANEL
CO2: 27 mEq/L (ref 19–32)
Calcium: 8.6 mg/dL (ref 8.4–10.5)
Creatinine, Ser: 1.19 mg/dL (ref 0.50–1.35)
GFR calc non Af Amer: 67 mL/min — ABNORMAL LOW (ref >90)
Glucose, Bld: 89 mg/dL (ref 70–99)
Sodium: 132 mEq/L — ABNORMAL LOW (ref 135–145)

## 2012-05-14 LAB — CARBOXYHEMOGLOBIN: O2 Saturation: 69.6 %

## 2012-05-14 MED ORDER — TORSEMIDE 20 MG PO TABS: 20.0000 mg | ORAL_TABLET | Freq: Two times a day (BID) | ORAL | Status: DC

## 2012-05-14 MED ORDER — METOPROLOL SUCCINATE ER 25 MG PO TB24: 25.0000 mg | ORAL_TABLET | Freq: Every day | ORAL | Status: DC

## 2012-05-14 NOTE — Plan of Care (Signed)
Problem: Phase III Progression Outcomes Goal: Other Phase III Outcomes/Goals Pt educated by home health care nurse regarding home milrinone dosing and application of milrinone pump to PICC line; pt demonstrated proper setup and use; questions encouraged and answered c parents at bedside

## 2012-05-14 NOTE — Discharge Summary (Signed)
Discharge Summary   Patient ID: Robert [name],  MRN: [id number], DOB/AGE: 1955-04-11 57 y.o.  Admit date: 05/12/2012 Discharge date: 05/14/2012  Primary Physician: Default, Provider, MD Primary Cardiologist: D. Bensimhon, MD  Discharge Diagnoses Principal Problem:   Acute on chronic systolic heart failure  - Total I/O: - 1171.9 cc  - Admission weight: 218 lbs  - Discharge weight: 201 lbs  - Toprol-XL 25mg  BID reduced to Toprol-XL 25mg  daily  - ACEi held  - To resume all prior home medications, follow-up in the heart failure clinic this week  Active Problems:   Syncope  - Suspected secondary to CHF decompensation with increase of BB and low-output state   NICM (EF <15% on TTE 01/2012), normal cors at cath 4/11  - Currently on [facility] list at [facility]    Acute renal failure  - Secondary to acute CHF decompensation, improved with diuresis  - ACEi held   Essential hypertension, benign  - See antihypertensive adjustments above   Non-sustained ventricular tachycardia  - Device interrogated this admission   - Brief (<5 sec) runs noted   History of ventricular tachycardia    - S/p ICD   Allergies No Known Allergies  Diagnostic Studies/Procedures  PORTABLE CHEST X-RAY - 05/12/12  IMPRESSION:  Right PICC line good position.  History of Present Illness Robert [name] is a 57 y.o. male who was admitted to [facility] on 05/12/12 with the above problem list.   He has a past medical history significant for suspected hypertensive/nonischemic cardiomyopathy with EF < 15% (01/13/12), s/p dual chamber Medtronic ICD implanted 07/2009. Cath 2013 showed normal coronaries. Right heart cath was also completed at that time showing low cardiac output.   He is followed very closely in Heart Failure Clinic. In December he was referred to the Transplant Clinic at [facility] as his symptoms began to worsen. Hypotension and frequent NSVT limited titration of heart failure meds. He underwent  transplant evaluation, RHC showed low output with high filling pressures. He was diuresed and placed on home inotropes. He was feeling improvement with milrinone at home until the last couple of days. Two days prior to admission, he experienced a fluttering sensation with presyncopal symptoms proceeded by a near syncopal episode the following day. He became nauseous and diaphoretic on minimal exertion the date of admission. He called the HF clinic describing his symptoms. The decision was made direct-admit the patient for acute on chronic combined CHF concerning for low output.   Hospital Course   He was admitted to the step down unit. ICD was interrogated which revealed multiple brief runs of nonsustained ventricular tachycardia lasting less than 5 seconds. Bedside echo revealed LVEF 5%, RV with only mild hypokinesis and no tamponade features. A basic metabolic panel revealed a mildly elevated creatinine (1.4) consistent with acute renal failure. ACE inhibitor was held. Co-ox 57%. CVP 8-9. Chest x-ray as above revealed appropriate PICC line placement. On exam, he was noted to be fairly euvolemic. He was also noted to be hypertensive and beta blocker was held overnight. The suspicion was a recent increase in beta blocker therapy account for the acute decompensation. He was restarted on home medications including torsemide the following day with good urinary output. The symptoms improved and he ambulated in the halls without difficulty. Toprol was reintroduced at a lower dose. ACE inhibitor has been held. Renal function improved. He was evaluated by Dr. Gala Romney today and deemed stable for discharge. He will followup in the heart failure clinic  later this week. This information is been clearly outlined in the discharge AVS.   Discharge Vitals:  Blood pressure 133/70, pulse 69, temperature 97.9 F (36.6 C), temperature source Oral, resp. rate 18, height 6\' 2"  (1.88 m), weight 91.3 kg (201 lb 4.5 oz), SpO2  98.00%.   Labs: Recent Labs     05/12/12  1630  WBC  12.0*  HGB  15.0  HCT  42.7  MCV  87.7  PLT  185   Recent Labs Lab 05/12/12 1630 05/13/12 0221 05/14/12 0413  NA 130* 130* 132*  K 4.2 3.7 3.9  CL 92* 93* 96  CO2 24 27 27   BUN 31* 27* 23  CREATININE 1.40* 1.17 1.19  CALCIUM 9.4 8.7 8.6  PROT 7.5  --   --   BILITOT 0.9  --   --   ALKPHOS 77  --   --   ALT 32  --   --   AST 29  --   --   GLUCOSE 116* 147* 89   Disposition:  Discharge Orders   Future Appointments Provider Department Dept Phone   05/19/2012 10:45 AM [facility] [phone]   Future Orders Complete By Expires     Diet - low sodium heart healthy  As directed     Increase activity slowly  As directed       Follow-up Information   Follow up with Arvilla Meres, MD On 05/19/2012. (At 10:45 AM for heart failure follow-up.)    Contact information:   [address] [place] [address] [phone]      Discharge Medications:    Medication List    STOP taking these medications       lisinopril 10 MG tablet  Commonly known as:  PRINIVIL,ZESTRIL     metoprolol 50 MG tablet  Commonly known as:  LOPRESSOR      TAKE these medications       aspirin 81 MG chewable tablet  Chew 81 mg by mouth daily.     digoxin 0.125 MG tablet  Commonly known as:  LANOXIN  Take 0.125 mg by mouth daily.     eplerenone 50 MG tablet  Commonly known as:  INSPRA  Take 0.5 tablets (25 mg total) by mouth daily.     metoprolol succinate 25 MG 24 hr tablet  Commonly known as:  TOPROL-XL  Take 1 tablet (25 mg total) by mouth daily.     torsemide 20 MG tablet  Commonly known as:  DEMADEX  Take 1 tablet (20 mg total) by mouth 2 (two) times daily.       Outstanding Labs/Studies: None  Duration of Discharge Encounter: Greater than 30 minutes including physician time.  Signed, R. Hurman Horn, PA-C 05/14/2012, 12:24 PM

## 2012-05-14 NOTE — Progress Notes (Signed)
Advanced Heart Failure Team History and Physical Note   Primary Cardiologist:  Dr. Royann Shivers  Reason for Admission: Questionable low output heart failure  HPI:    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 Cath 2013 showed normal coronaries.   He is followed very closely in Heart Failure Clinic.  In December he was referred to the Transplant Clinic at [facility] as his symptoms began to worsen, we were unable to titrate meds due to hypotension and frequent NSVT.  He underwent transplant evaluation, RHC showed low output with high filling pressures.  He was diuresed, b-blocker increased and placed on home inotropes.    Admitted 2/7 with syncopal episode and worsening HF symptoms. ICD interrogation with multiple very short runs NSVT. No prolonged episodes.   B-blocker restarted at 25mg  daily.  Remains off lisinopril. Feels much better. Walking hall. Co-ox much improved. Now 69% Cr back to baseline. SBP in low 90s.    Allergies:  No Known Allergies  Objective:    Vital Signs:   Temp:  [98 F (36.7 C)-98.6 F (37 C)] 98 F (36.7 C) (02/09 0744) Pulse Rate:  [69] 69 (02/09 0933) Resp:  [15-16] 16 (02/09 0744) BP: (89-133)/(57-76) 133/70 mmHg (02/09 0800) SpO2:  [90 %-100 %] 98 % (02/09 0744) Weight:  [91.3 kg (201 lb 4.5 oz)] 91.3 kg (201 lb 4.5 oz) (02/09 0434) Last BM Date: 05/12/12 Filed Weights   05/12/12 1723 05/13/12 0316 05/14/12 0434  Weight: 99.1 kg (218 lb 7.6 oz) 91 kg (200 lb 9.9 oz) 91.3 kg (201 lb 4.5 oz)    Physical Exam: General: Looks great. Ambulating hall. No resp difficulty  HEENT: normal  Neck: supple. JVP 7-8. Carotids 2+ bilat; no bruits. No lymphadenopathy or thryomegaly appreciated.  Cor: PMI laterally displaced. Regular rate & rhythm. 3/6 MR. +s3  Lungs: clear  Abdomen: soft, nontender, nondistended. No hepatosplenomegaly. No  bruits or masses. Good bowel sounds.  Extremities: no cyanosis, clubbing, rash, edema  Neuro: alert & orientedx3, cranial nerves grossly intact. moves all 4 extremities w/o difficulty. Affect pleasant  Results for orders placed during the hospital encounter of 05/12/12 (from the past 24 hour(s))  CARBOXYHEMOGLOBIN     Status: None   Collection Time    05/14/12  4:10 AM      Result Value Range   Total hemoglobin 13.7  13.5 - 18.0 g/dL   O2 Saturation 40.9     Carboxyhemoglobin 1.1  0.5 - 1.5 %   Methemoglobin 1.0  0.0 - 1.5 %  BASIC METABOLIC PANEL     Status: Abnormal   Collection Time    05/14/12  4:13 AM      Result Value Range   Sodium 132 (*) 135 - 145 mEq/L   Potassium 3.9  3.5 - 5.1 mEq/L   Chloride 96  96 - 112 mEq/L   CO2 27  19 - 32 mEq/L   Glucose, Bld 89  70 - 99 mg/dL   BUN 23  6 - 23 mg/dL   Creatinine, Ser 8.11  0.50 - 1.35 mg/dL   Calcium 8.6  8.4 - 91.4 mg/dL   GFR calc non Af Amer 67 (*) >90 mL/min   GFR calc Af Amer 77 (*) >90 mL/min     Assessment:   1. Chronic systolic heart failure requiring home inotropes     --on transplant list  at [facility] status Ib (blood type O) 2. NICM, EF 10%     --s/p Medtronic ICD 3. Syncope 4. H/o VT 5. NSVT 6. Acute renal failure 7. Hyponatremia  Plan/Discussion:    Suspect problem was increase in Toprol. Also unable to tolerate lisinopril in setting of milrinone.  Will send home today on Toprol 25 daily (down from 50 bid) and also hold lisinopril due to ACE. Continue other meds.  We will see him back in HF clinic this week.    Daniel Bensimhon,MD 10:54 AM

## 2012-05-14 NOTE — Progress Notes (Signed)
Utilization Review Completed.Robert [name] T2/12/2012  

## 2012-05-19 ENCOUNTER — Inpatient Hospital Stay (HOSPITAL_COMMUNITY): Payer: Medicare (Managed Care)

## 2012-05-29 ENCOUNTER — Ambulatory Visit (HOSPITAL_BASED_OUTPATIENT_CLINIC_OR_DEPARTMENT_OTHER)
Admission: RE | Admit: 2012-05-29 | Discharge: 2012-05-29 | Disposition: A | Discharge status: Home / Self Care | Payer: BC Managed Care – PPO | Source: Ambulatory Visit | Attending: Internal Medicine | Admitting: Internal Medicine

## 2012-05-29 ENCOUNTER — Encounter (HOSPITAL_COMMUNITY): Payer: Self-pay | Admitting: *Deleted

## 2012-05-29 ENCOUNTER — Telehealth (HOSPITAL_COMMUNITY): Payer: Self-pay | Admitting: Anesthesiology

## 2012-05-29 VITALS — HR 110 | Wt 207.5 lb

## 2012-05-29 DIAGNOSIS — I5022 Chronic systolic (congestive) heart failure: Secondary | ICD-10-CM

## 2012-05-29 DIAGNOSIS — I5023 Acute on chronic systolic (congestive) heart failure: Secondary | ICD-10-CM | POA: Insufficient documentation

## 2012-05-29 LAB — BASIC METABOLIC PANEL
CO2: 27 mEq/L (ref 19–32)
Calcium: 9.8 mg/dL (ref 8.4–10.5)
Glucose, Bld: 123 mg/dL — ABNORMAL HIGH (ref 70–99)
Potassium: 3.4 mEq/L — ABNORMAL LOW (ref 3.5–5.1)
Sodium: 135 mEq/L (ref 135–145)

## 2012-05-29 LAB — MAGNESIUM: Magnesium: 2.3 mg/dL (ref 1.5–2.5)

## 2012-05-29 NOTE — Assessment & Plan Note (Addendum)
Patient reports NYHA III symptoms, however believe he is minimizing his symptoms. Complaining of more fatigue, loss of appetite and increased dizziness. Is supposed to go for appt with Dr. Romona Curls on 2-26 and Dr. Jose Persia on 06/09/12 at [facility]. Discussion with Allena Katz about increased symptoms and concern that patient maybe needing a VAD sooner. Will RHC tomorrow and if need be will VAD later this week. Will continue to be in close contact with [facility]. Patient is currently listed 1B status. Leave medications as they are and will see what RHC reveals. Will discuss at [facility] meeting this afternoon. K+ 3.4 call patient and see if he has any potassium in order to take 20 meq, if not will call a prescription in today. Cr. 1.19.  Patient seen and examined with Ulla Potash, NP. We discussed all aspects of the encounter. I agree with the assessment and plan as stated above.  I fear that he is clinically deteriorating. His symptoms appear more IIIB-IV to me despite milrinone support and exam is very concerning. I feel he needs RHC tomorrow with eye toward bridge LVAD in the very near future. Will plan RHC tomorrow. Case d/w Dr. Allena Katz at the [facility] who agrees. I also discussed with Dr. Donata Clay.

## 2012-05-29 NOTE — Progress Notes (Signed)
Patient ID: Robert [name], male   DOB: 1956-01-11, 57 y.o.   MRN: [id number] Primary Cardiologist: Dr. 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 Cath 2011 showed normal coronaries. He also has a history of VTach.  Admitted in early May 2013 with ICD shocks due to VT.   ECHO 08/05/11:  LVEF 20% with diffuse hypokinesis with restrictive physiology. LV severely dilated LVIDd = 8.2 cm (up from 7.8cm in 2011) Moderate perivalvular mitral regurgitation. LA moderately to severely dilated. RV with mildly reduced function; PAPP .   Carotid Doppler 08/05/11: No significant extracranial carotid artery stenosis  CPX 08/11/11:   FVC 3.02 (65%) FEV1 2.43 (66%) FEV1/FVC 80% MVV 150 (95%) Peak VO2 17.3 ml/kg/min (57% predicted) VE/VCO2 slope 48.3,  pRER 1.08, Vent threshold 13.4% (predicted 44.4%) VE/MVV 61.7% - EKG with frequent PVCs during exercise  LHC: 08/15/11: no CAD RHC: 08/15/11 Aortic pressure 93/79 (mean 85) mm Hg  Left ventricle 91/16 mm Hg with end-diastolic pressure of 29 mm Hg  RA 9 RV 49/9/12 PA 50/30 (39) PCWP 35 Fick CO 3.3/1.45 PA oxygen saturation 53%  AO oxygen saturation 97%  RA oxygen saturation 56%  PVR 1.0 Woods  SVR 1837  01/13/2012 ECHO EF 15% at [facility]  RHC 05/05/12 RA 8 RV 52/5 PA 54/27 (38) PCWP 24 CO/CI 5.3/2.4 PVR 2.6  Follow up: He was admitted to the hospital 2/7-2/9 for decompensating heart failure symptoms. He reported having a fluttering sensation with presyncopal symptoms proceeded by a near syncopal episode before admission. He had been seen at [facility] a week or so before and they titrated his Metoprolol up and this was decreased back to 25 mg when he was readmitted. Since discharge was feeling ok until yesterday when he started getting dizzy. He reports his appetite is decreased and he has no energy. Denies  SOB/orthopnea/CP. Weight at home 198-201 lbs. Taking medications as prescribed.  6 min walk test: 1100 ft with no stops   ROS: All systems negative except as listed in HPI, PMH and Problem List.  Past Medical History  Diagnosis Date  . AICD (automatic cardioverter/defibrillator) present   . Hypertension   . Congestive heart failure   . Pneumonia   . Bronchitis     Current Outpatient Prescriptions  Medication Sig Dispense Refill  . aspirin 81 MG chewable tablet Chew 81 mg by mouth daily.      . digoxin (LANOXIN) 0.125 MG tablet Take 0.125 mg by mouth daily.      Marland Kitchen eplerenone (INSPRA) 50 MG tablet Take 0.5 tablets (25 mg total) by mouth daily.  15 tablet  3  . metoprolol succinate (TOPROL-XL) 25 MG 24 hr tablet Take 1 tablet (25 mg total) by mouth daily.  30 tablet  3  . torsemide (DEMADEX) 20 MG tablet Take 1 tablet (20 mg total) by mouth 2 (two) times daily.  60 tablet  3   No current facility-administered medications for this encounter.     PHYSICAL EXAM: Filed Vitals:   05/29/12 1234  Pulse: 110  Weight: 207 lb 8 oz (94.121 kg)  SpO2: 96%    General: Chronically ill appearing. No resp difficulty  HEENT: normal  Neck: supple. JVP 7-8 Carotids 2+ bilat; no bruits. No lymphadenopathy or thryomegaly appreciated.  Cor: PMI laterally displaced. Regular rate & rhythm. 3/6 MR. +s3  Lungs:  clear  Abdomen: soft, nontender, nondistended. No hepatosplenomegaly. No bruits or masses. Good bowel sounds.  Extremities: no cyanosis, clubbing, rash, edema  Neuro: alert & orientedx3, cranial nerves grossly intact. moves all 4 extremities w/o difficulty. Affect pleasant   ASSESSMENT & PLAN:

## 2012-05-29 NOTE — Telephone Encounter (Signed)
K+ 3.4. Tried to call patient on cell and home phone. Left message that if he had potassium pills at home to take 40 meq. Will check tomorrow and follow up when he comes in for cath.

## 2012-05-30 ENCOUNTER — Inpatient Hospital Stay (HOSPITAL_COMMUNITY)
Admission: RE | Admit: 2012-05-30 | Discharge: 2012-06-16 | DRG: 103 | Disposition: A | Discharge status: Home Health | Payer: BC Managed Care – PPO | Source: Ambulatory Visit | Attending: Internal Medicine | Admitting: Internal Medicine

## 2012-05-30 ENCOUNTER — Encounter (HOSPITAL_COMMUNITY)
Admission: RE | Disposition: A | Discharge status: Home / Self Care | Payer: Self-pay | Source: Ambulatory Visit | Attending: Cardiothoracic Surgery

## 2012-05-30 DIAGNOSIS — I472 Ventricular tachycardia, unspecified: Secondary | ICD-10-CM | POA: Diagnosis present

## 2012-05-30 DIAGNOSIS — T450X5A Adverse effect of antiallergic and antiemetic drugs, initial encounter: Secondary | ICD-10-CM | POA: Diagnosis not present

## 2012-05-30 DIAGNOSIS — G250 Essential tremor: Secondary | ICD-10-CM | POA: Diagnosis not present

## 2012-05-30 DIAGNOSIS — K560 Paralytic ileus: Secondary | ICD-10-CM | POA: Diagnosis not present

## 2012-05-30 DIAGNOSIS — I509 Heart failure, unspecified: Secondary | ICD-10-CM | POA: Diagnosis present

## 2012-05-30 DIAGNOSIS — Z7682 Awaiting organ transplant status: Secondary | ICD-10-CM

## 2012-05-30 DIAGNOSIS — E876 Hypokalemia: Secondary | ICD-10-CM | POA: Diagnosis present

## 2012-05-30 DIAGNOSIS — Z95810 Presence of automatic (implantable) cardiac defibrillator: Secondary | ICD-10-CM

## 2012-05-30 DIAGNOSIS — J95811 Postprocedural pneumothorax: Secondary | ICD-10-CM | POA: Diagnosis not present

## 2012-05-30 DIAGNOSIS — E871 Hypo-osmolality and hyponatremia: Secondary | ICD-10-CM | POA: Diagnosis not present

## 2012-05-30 DIAGNOSIS — E46 Unspecified protein-calorie malnutrition: Secondary | ICD-10-CM | POA: Diagnosis not present

## 2012-05-30 DIAGNOSIS — I059 Rheumatic mitral valve disease, unspecified: Secondary | ICD-10-CM | POA: Diagnosis present

## 2012-05-30 DIAGNOSIS — Y831 Surgical operation with implant of artificial internal device as the cause of abnormal reaction of the patient, or of later complication, without mention of misadventure at the time of the procedure: Secondary | ICD-10-CM | POA: Diagnosis not present

## 2012-05-30 DIAGNOSIS — I5023 Acute on chronic systolic (congestive) heart failure: Principal | ICD-10-CM | POA: Diagnosis present

## 2012-05-30 DIAGNOSIS — I4729 Other ventricular tachycardia: Secondary | ICD-10-CM | POA: Diagnosis present

## 2012-05-30 DIAGNOSIS — Z79899 Other long term (current) drug therapy: Secondary | ICD-10-CM

## 2012-05-30 DIAGNOSIS — K929 Disease of digestive system, unspecified: Secondary | ICD-10-CM | POA: Diagnosis not present

## 2012-05-30 DIAGNOSIS — D696 Thrombocytopenia, unspecified: Secondary | ICD-10-CM | POA: Diagnosis not present

## 2012-05-30 DIAGNOSIS — R55 Syncope and collapse: Secondary | ICD-10-CM

## 2012-05-30 DIAGNOSIS — I10 Essential (primary) hypertension: Secondary | ICD-10-CM | POA: Diagnosis present

## 2012-05-30 DIAGNOSIS — Z7982 Long term (current) use of aspirin: Secondary | ICD-10-CM

## 2012-05-30 DIAGNOSIS — R791 Abnormal coagulation profile: Secondary | ICD-10-CM | POA: Diagnosis not present

## 2012-05-30 DIAGNOSIS — I428 Other cardiomyopathies: Secondary | ICD-10-CM | POA: Diagnosis present

## 2012-05-30 DIAGNOSIS — M109 Gout, unspecified: Secondary | ICD-10-CM | POA: Diagnosis not present

## 2012-05-30 DIAGNOSIS — Y9210 Unspecified residential institution as the place of occurrence of the external cause: Secondary | ICD-10-CM | POA: Diagnosis not present

## 2012-05-30 DIAGNOSIS — D72829 Elevated white blood cell count, unspecified: Secondary | ICD-10-CM | POA: Diagnosis not present

## 2012-05-30 DIAGNOSIS — I2789 Other specified pulmonary heart diseases: Secondary | ICD-10-CM | POA: Diagnosis present

## 2012-05-30 DIAGNOSIS — R570 Cardiogenic shock: Secondary | ICD-10-CM | POA: Diagnosis present

## 2012-05-30 HISTORY — PX: RIGHT HEART CATHETERIZATION: SHX5447

## 2012-05-30 LAB — CBC
HCT: 39.1 % (ref 39.0–52.0)
Hemoglobin: 14.9 g/dL (ref 13.0–17.0)
MCH: 30.3 pg (ref 26.0–34.0)
MCHC: 34.5 g/dL (ref 30.0–36.0)
Platelets: 173 10*3/uL (ref 150–400)
Platelets: 158 10*3/uL (ref 150–400)
RBC: 4.92 MIL/uL (ref 4.22–5.81)
RDW: 13.1 % (ref 11.5–15.5)
WBC: 8.6 10*3/uL (ref 4.0–10.5)
WBC: 9.2 10*3/uL (ref 4.0–10.5)

## 2012-05-30 LAB — POCT I-STAT 3, VENOUS BLOOD GAS (G3P V)
Acid-Base Excess: 1 mmol/L (ref 0.0–2.0)
Acid-Base Excess: 2 mmol/L (ref 0.0–2.0)
Bicarbonate: 26.4 mEq/L — ABNORMAL HIGH (ref 20.0–24.0)
O2 Saturation: 69 %
pCO2, Ven: 39.1 mmHg — ABNORMAL LOW (ref 45.0–50.0)
pCO2, Ven: 40.2 mmHg — ABNORMAL LOW (ref 45.0–50.0)
pH, Ven: 7.42 — ABNORMAL HIGH (ref 7.250–7.300)
pH, Ven: 7.426 — ABNORMAL HIGH (ref 7.250–7.300)
pO2, Ven: 34 mmHg (ref 30.0–45.0)
pO2, Ven: 36 mmHg (ref 30.0–45.0)

## 2012-05-30 LAB — BASIC METABOLIC PANEL
CO2: 27 mEq/L (ref 19–32)
Calcium: 9.6 mg/dL (ref 8.4–10.5)
GFR calc non Af Amer: 63 mL/min — ABNORMAL LOW (ref >90)
Glucose, Bld: 93 mg/dL (ref 70–99)
Potassium: 2.9 mEq/L — ABNORMAL LOW (ref 3.5–5.1)
Sodium: 138 mEq/L (ref 135–145)

## 2012-05-30 LAB — CREATININE, SERUM
Creatinine, Ser: 1.05 mg/dL (ref 0.50–1.35)
GFR calc Af Amer: 90 mL/min — ABNORMAL LOW (ref >90)
GFR calc non Af Amer: 78 mL/min — ABNORMAL LOW (ref >90)

## 2012-05-30 LAB — PROTIME-INR: Prothrombin Time: 13.6 seconds (ref 11.6–15.2)

## 2012-05-30 LAB — MRSA PCR SCREENING: MRSA by PCR: NEGATIVE

## 2012-05-30 SURGERY — RIGHT HEART CATH
Anesthesia: LOCAL

## 2012-05-30 MED ORDER — MILRINONE IN DEXTROSE 20 MG/100ML IV SOLN
0.2500 ug/kg/min | INTRAVENOUS | Status: DC
  Administered 2012-05-30: 0.25 ug/kg/min via INTRAVENOUS
  Filled 2012-05-30 (×2): qty 100

## 2012-05-30 MED ORDER — DIAZEPAM 5 MG PO TABS
ORAL_TABLET | ORAL | Status: AC
  Filled 2012-05-30: qty 1

## 2012-05-30 MED ORDER — METOPROLOL SUCCINATE ER 25 MG PO TB24: 25.0000 mg | ORAL_TABLET | Freq: Every day | ORAL | Status: DC

## 2012-05-30 MED ORDER — SODIUM CHLORIDE 0.9 % IV SOLN: 250.0000 mL | INTRAVENOUS | Status: DC | PRN

## 2012-05-30 MED ORDER — OXYCODONE-ACETAMINOPHEN 5-325 MG PO TABS
1.0000 | ORAL_TABLET | ORAL | Status: DC | PRN
  Administered 2012-05-30 – 2012-05-31 (×2): 1 via ORAL
  Filled 2012-05-30 (×2): qty 1

## 2012-05-30 MED ORDER — ASPIRIN EC 81 MG PO TBEC
81.0000 mg | DELAYED_RELEASE_TABLET | Freq: Every day | ORAL | Status: DC
  Administered 2012-05-31 – 2012-06-01 (×2): 81 mg via ORAL
  Filled 2012-05-30 (×3): qty 1

## 2012-05-30 MED ORDER — SODIUM CHLORIDE 0.9 % IJ SOLN: 3.0000 mL | Freq: Two times a day (BID) | INTRAMUSCULAR | Status: DC

## 2012-05-30 MED ORDER — ENOXAPARIN SODIUM 40 MG/0.4ML ~~LOC~~ SOLN
40.0000 mg | SUBCUTANEOUS | Status: DC
  Administered 2012-05-31: 40 mg via SUBCUTANEOUS
  Filled 2012-05-30 (×2): qty 0.4

## 2012-05-30 MED ORDER — SODIUM CHLORIDE 0.9 % IJ SOLN: 3.0000 mL | INTRAMUSCULAR | Status: DC | PRN

## 2012-05-30 MED ORDER — TORSEMIDE 20 MG PO TABS
20.0000 mg | ORAL_TABLET | Freq: Every day | ORAL | Status: DC
  Filled 2012-05-30: qty 1

## 2012-05-30 MED ORDER — POTASSIUM CHLORIDE CRYS ER 20 MEQ PO TBCR
40.0000 meq | EXTENDED_RELEASE_TABLET | Freq: Once | ORAL | Status: AC
  Administered 2012-05-30: 40 meq via ORAL

## 2012-05-30 MED ORDER — ONDANSETRON HCL 4 MG/2ML IJ SOLN
4.0000 mg | Freq: Four times a day (QID) | INTRAMUSCULAR | Status: DC | PRN
  Administered 2012-05-31: 4 mg via INTRAVENOUS
  Filled 2012-05-30: qty 2

## 2012-05-30 MED ORDER — SODIUM CHLORIDE 0.9 % IJ SOLN
3.0000 mL | Freq: Two times a day (BID) | INTRAMUSCULAR | Status: DC
  Administered 2012-05-31 – 2012-06-01 (×4): 3 mL via INTRAVENOUS

## 2012-05-30 MED ORDER — ACETAMINOPHEN 325 MG PO TABS: 650.0000 mg | ORAL_TABLET | ORAL | Status: DC | PRN

## 2012-05-30 MED ORDER — DIGOXIN 125 MCG PO TABS
0.1250 mg | ORAL_TABLET | Freq: Every day | ORAL | Status: DC
Start: 2012-05-31 — End: 2012-06-02
  Administered 2012-05-31 – 2012-06-01 (×2): 0.125 mg via ORAL
  Filled 2012-05-30 (×3): qty 1

## 2012-05-30 MED ORDER — DIAZEPAM 5 MG PO TABS
5.0000 mg | ORAL_TABLET | ORAL | Status: AC
  Administered 2012-05-30: 5 mg via ORAL

## 2012-05-30 MED ORDER — MIDAZOLAM HCL 2 MG/2ML IJ SOLN
INTRAMUSCULAR | Status: AC
  Filled 2012-05-30: qty 2

## 2012-05-30 MED ORDER — TORSEMIDE 20 MG PO TABS: 20.0000 mg | ORAL_TABLET | Freq: Every day | ORAL | Status: DC

## 2012-05-30 MED ORDER — FENTANYL CITRATE 0.05 MG/ML IJ SOLN
INTRAMUSCULAR | Status: AC
  Filled 2012-05-30: qty 2

## 2012-05-30 MED ORDER — DIGOXIN 125 MCG PO TABS: 0.1250 mg | ORAL_TABLET | Freq: Every day | ORAL | Status: DC

## 2012-05-30 MED ORDER — POTASSIUM CHLORIDE CRYS ER 20 MEQ PO TBCR
EXTENDED_RELEASE_TABLET | ORAL | Status: AC
  Administered 2012-05-30: 40 meq via ORAL
  Filled 2012-05-30: qty 2

## 2012-05-30 MED ORDER — ASPIRIN EC 81 MG PO TBEC: 81.0000 mg | DELAYED_RELEASE_TABLET | Freq: Every day | ORAL | Status: DC

## 2012-05-30 MED ORDER — METOPROLOL SUCCINATE ER 25 MG PO TB24
25.0000 mg | ORAL_TABLET | Freq: Every day | ORAL | Status: DC
  Filled 2012-05-30: qty 1

## 2012-05-30 NOTE — CV Procedure (Signed)
Cardiac Cath Procedure Note:  Indication:   Procedures performed:  1) Right heart catheterization  Description of procedure:   The risks and indication of the procedure were explained. Consent was signed and placed on the chart. An appropriate timeout was taken prior to the procedure. The right neck was prepped and draped in the routine sterile fashion and anesthetized with 1% local lidocaine.   A 7 FR venous sheath was placed in the right internal jugular vein using a modified Seldinger technique an ultrasound guidance.  A standard Swan-Ganz catheter was used for the procedure.   Complications: None apparent.  Findings:  Done on milrinone 0.25 mcg/kg/min  RA = 1 RV = 35/0/3 PA = 41/21 (30) PCW = 25 v-wave = 45 Fick cardiac output/index =  3.9/1.99 Thermo CO/CO = 3.5/1.9 PVR = 1.4 Woods (Fick) FA sat =100% PA sat = 64%, 67%  Assessment:  1. Elevate L-sided filling pressures with low cardiac output on milrinone 2. Mild pulmonary venous HTN with normal PVR and normal RV function 3. Prominent v-waves in wedge tracing due to severe MR  Plan/Discussion:  His outputs are borderline low despite milrinone. However, I do not think he need an IABP just yet. Will admit. Keep swan in. Follow hemodynamics closely. Will need bridge VAD. (Blood type O)  Robert [name] 10:03 PM

## 2012-05-30 NOTE — H&P (Addendum)
Advanced Heart Failure Team History and Physical Note   Primary Physician: Primary Cardiologist:  Croituro  Reason for Admission: Decompensated HF   HPI:    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 Cath 2011 showed normal coronaries. He also has a history of VTach.   Recently struggling with advanced HF symptoms. Followed at [facility] and being listed for transplant. Seen at [facility] several weeks ago and started on milrinone for low output HF. Admitted here last week for syncope and hypotension due to increase in b-blocker which was then cut back.   Presented to office yesterday with worsening low-output HF. Brought in today for RHC and possible placement of IABP and VAD placement.   CPX 04/19/12 Peak VO2: 14.7 ml/kg/min predicted peak VO2: 49.6% VE/VCO2 slope: 48.1 OUES: 1.84 Peak RER: 1.11 Ventilatory Threshold: 11.4 % predicted peak VO2: 38.5% VE/MVV: 50.3% O2pulse: 11 % predicted O2pulse: 61%   Review of Systems: [y] = yes, [ ]  = no   General: Weight gain [ ] ; Weight loss Cove.Etienne ]; Anorexia Cove.Etienne ]; Fatigue [y]; Fever [ ] ; Chills [ ] ; Weakness [ ]   Cardiac: Chest pain/pressure [ ] ; Resting SOB [ ] ; Exertional SOB Cove.Etienne ]; Orthopnea [ ] ; Pedal Edema [ ] ; Palpitations [ ] ; Syncope [ ] ; Presyncope [ ] ; Paroxysmal nocturnal dyspnea[ ]   Pulmonary: Cough [ ] ; Wheezing[ ] ; Hemoptysis[ ] ; Sputum [ ] ; Snoring [ ]   GI: Vomiting[ ] ; Dysphagia[ ] ; Melena[ ] ; Hematochezia [ ] ; Heartburn[ ] ; Abdominal pain [ ] ; Constipation [ ] ; Diarrhea [ ] ; BRBPR [ ]   GU: Hematuria[ ] ; Dysuria [ ] ; Nocturia[ ]   Vascular: Pain in legs with walking [ ] ; Pain in feet with lying flat [ ] ; Non-healing sores [ ] ; Stroke [ ] ; TIA [ ] ; Slurred speech [ ] ;  Neuro: Headaches[ ] ; Vertigo[ ] ; Seizures[ ] ; Paresthesias[ ] ;Blurred vision [ ] ; Diplopia [ ] ; Vision changes [ ]    Ortho/Skin: Arthritis [ ] ; Joint pain [ ] ; Muscle pain [ ] ; Joint swelling [ ] ; Back Pain [ ] ; Rash [ ]   Psych: Depression[ ] ; Anxiety[ ]   Heme: Bleeding problems [ ] ; Clotting disorders [ ] ; Anemia [ ]   Endocrine: Diabetes [ ] ; Thyroid dysfunction[ ]   Home Medications Prior to Admission medications   Medication Sig Start Date End Date Taking? Authorizing Provider  aspirin 81 MG chewable tablet Chew 81 mg by mouth daily.   Yes Historical Provider, MD  digoxin (LANOXIN) 0.125 MG tablet Take 0.125 mg by mouth daily. 08/11/11 08/10/12 Yes Hadassah Pais, PA  eplerenone (INSPRA) 50 MG tablet Take 0.5 tablets (25 mg total) by mouth daily. 03/30/12  Yes Amy D Clegg, NP  metoprolol succinate (TOPROL-XL) 25 MG 24 hr tablet Take 1 tablet (25 mg total) by mouth daily. 05/14/12  Yes Roger A Arguello, PA-C  sodium chloride 0.9 % SOLN with milrinone 1 MG/ML SOLN 200 mcg/mL Inject 0.25 mcg/kg/min into the vein continuous.   Yes Historical Provider, MD  torsemide (DEMADEX) 20 MG tablet Take 1 tablet (20 mg total) by mouth 2 (two) times daily. 05/14/12  Yes Gery Pray, PA-C    Past Medical History: Past Medical History  Diagnosis Date  . AICD (automatic cardioverter/defibrillator) present   . Hypertension   . Congestive heart failure   . Pneumonia   . Bronchitis     Past  Surgical History: Past Surgical History  Procedure Laterality Date  . Cardiac defibrillator placement    . Cardiac catheterization    . Colonoscopy  12/29/2011    Procedure: COLONOSCOPY;  Surgeon: Iva Boop, MD;  Location: [facility];  Service: Endoscopy;  Laterality: N/A;    Family History: No family history on file.  Social History: History   Social History  . Marital Status: Single    Spouse Name: N/A    Number of Children: 2  . Years of Education: N/A   Occupational History  . Disabled    Social History Main Topics  . Smoking status: Never Smoker   . Smokeless tobacco: Never Used  . Alcohol Use: No  .  Drug Use: No  . Sexually Active: Not on file   Other Topics Concern  . Not on file   Social History Narrative  . No narrative on file    Allergies:  No Known Allergies  Objective:    Vital Signs:   Temp:  [97.2 F (36.2 C)-98.1 F (36.7 C)] 98.1 F (36.7 C) (02/25 2100) Pulse Rate:  [46-98] 98 (02/25 2100) Resp:  [18-25] 25 (02/25 2100) BP: (100-119)/(72-88) 119/88 mmHg (02/25 2100) SpO2:  [98 %-100 %] 100 % (02/25 2100) Weight:  [90.719 kg (200 lb)] 90.719 kg (200 lb) (02/25 1254)   Filed Weights   05/30/12 1235 05/30/12 1254  Weight: 90.719 kg (200 lb) 90.719 kg (200 lb)    Physical Exam: General:  Fatigued appearing. No resp difficulty HEENT: normal Neck: supple. JVP flat . Carotids 2+ bilat; no bruits. No lymphadenopathy or thryomegaly appreciated. Cor: PMI laterally displaced. Regular rate & rhythm. Prominent s3. 3/6 MR Lungs: clear Abdomen: soft, nontender, nondistended. No hepatosplenomegaly. No bruits or masses. Good bowel sounds. Extremities: no cyanosis, clubbing, rash, edema Neuro: alert & orientedx3, cranial nerves grossly intact. moves all 4 extremities w/o difficulty. Affect pleasant    Labs: Basic Metabolic Panel:  Recent Labs Lab 05/29/12 1243 05/30/12 1218  NA 135 138  K 3.4* 2.9*  CL 96 98  CO2 27 27  GLUCOSE 123* 93  BUN 17 18  CREATININE 1.19 1.25  CALCIUM 9.8 9.6  MG 2.3  --     Liver Function Tests: No results found for this basename: AST, ALT, ALKPHOS, BILITOT, PROT, ALBUMIN,  in the last 168 hours No results found for this basename: LIPASE, AMYLASE,  in the last 168 hours No results found for this basename: AMMONIA,  in the last 168 hours  CBC:  Recent Labs Lab 05/30/12 1218  WBC 8.6  HGB 14.9  HCT 42.5  MCV 86.4  PLT 173    Cardiac Enzymes: No results found for this basename: CKTOTAL, CKMB, CKMBINDEX, TROPONINI,  in the last 168 hours  BNP: BNP (last 3 results)  Recent Labs  08/05/11 0648  PROBNP 2992.0*     CBG: No results found for this basename: GLUCAP,  in the last 168 hours  Coagulation Studies:  Recent Labs  05/30/12 1218  LABPROT 13.6  INR 1.05    Other results:  Imaging: No results found.      Assessment:   1. A/C systolic HF - Class IV - on home milrinone 2. Cardiogenic shock 3. NICM EF 15-20% with severe MR 4. H/o HTN 5. Hypokalemia 6. Blood type O  Plan/Discussion:     He continues to struggle with end-stage HF despite inotropic support (INTERMACS II-III). Plan RHC and IABP as needed. Will likely need bridge VAD. Discussed  with Drs. Vant Trigt and Optician, dispensing ([facility]).   Masiyah Engen,MD 10:00 PM

## 2012-05-31 ENCOUNTER — Encounter (HOSPITAL_COMMUNITY)
Admission: RE | Disposition: A | Discharge status: Home / Self Care | Payer: Self-pay | Source: Ambulatory Visit | Attending: Cardiothoracic Surgery

## 2012-05-31 ENCOUNTER — Encounter (HOSPITAL_COMMUNITY): Payer: Self-pay | Admitting: *Deleted

## 2012-05-31 ENCOUNTER — Inpatient Hospital Stay (HOSPITAL_COMMUNITY): Payer: BC Managed Care – PPO

## 2012-05-31 DIAGNOSIS — R570 Cardiogenic shock: Secondary | ICD-10-CM

## 2012-05-31 HISTORY — PX: INTRA-AORTIC BALLOON PUMP INSERTION: SHX5475

## 2012-05-31 LAB — URINALYSIS, ROUTINE W REFLEX MICROSCOPIC
Bilirubin Urine: NEGATIVE
Glucose, UA: NEGATIVE mg/dL
Hgb urine dipstick: NEGATIVE
Ketones, ur: NEGATIVE mg/dL
Leukocytes, UA: NEGATIVE
Nitrite: NEGATIVE
Protein, ur: NEGATIVE mg/dL
Specific Gravity, Urine: 1.025 (ref 1.005–1.030)
Urobilinogen, UA: 0.2 mg/dL (ref 0.0–1.0)
pH: 6 (ref 5.0–8.0)

## 2012-05-31 LAB — BASIC METABOLIC PANEL
Chloride: 101 mEq/L (ref 96–112)
Creatinine, Ser: 1 mg/dL (ref 0.50–1.35)
GFR calc Af Amer: >90 mL/min (ref >90)
GFR calc non Af Amer: 82 mL/min — ABNORMAL LOW (ref >90)

## 2012-05-31 LAB — CARBOXYHEMOGLOBIN
Carboxyhemoglobin: 1.2 % (ref 0.5–1.5)
Carboxyhemoglobin: 0.8 % (ref 0.5–1.5)
Methemoglobin: 0.8 % (ref 0.0–1.5)
O2 Saturation: 56.8 %
O2 Saturation: 42.9 %
Total hemoglobin: 12.9 g/dL — ABNORMAL LOW (ref 13.5–18.0)
Total hemoglobin: 13.8 g/dL (ref 13.5–18.0)
Total hemoglobin: 13.2 g/dL — ABNORMAL LOW (ref 13.5–18.0)

## 2012-05-31 LAB — SURGICAL PCR SCREEN
MRSA, PCR: NEGATIVE
Staphylococcus aureus: NEGATIVE

## 2012-05-31 LAB — ANTITHROMBIN III: AntiThromb III Func: 78 % (ref 75–120)

## 2012-05-31 SURGERY — INTRA-AORTIC BALLOON PUMP INSERTION
Anesthesia: LOCAL

## 2012-05-31 MED ORDER — FENTANYL CITRATE 0.05 MG/ML IJ SOLN
INTRAMUSCULAR | Status: AC
  Filled 2012-05-31: qty 2

## 2012-05-31 MED ORDER — HEPARIN (PORCINE) IN NACL 2-0.9 UNIT/ML-% IJ SOLN
INTRAMUSCULAR | Status: AC
  Filled 2012-05-31: qty 500

## 2012-05-31 MED ORDER — HEPARIN SODIUM (PORCINE) 1000 UNIT/ML IJ SOLN
INTRAMUSCULAR | Status: AC
  Filled 2012-05-31: qty 1

## 2012-05-31 MED ORDER — DOBUTAMINE IN D5W 4-5 MG/ML-% IV SOLN
2.5000 ug/kg/min | INTRAVENOUS | Status: DC
  Administered 2012-05-31: 2.5 ug/kg/min via INTRAVENOUS
  Filled 2012-05-31: qty 250

## 2012-05-31 MED ORDER — MIDAZOLAM HCL 2 MG/2ML IJ SOLN
INTRAMUSCULAR | Status: AC
  Filled 2012-05-31: qty 2

## 2012-05-31 MED ORDER — LIDOCAINE HCL (PF) 1 % IJ SOLN
INTRAMUSCULAR | Status: AC
  Filled 2012-05-31: qty 30

## 2012-05-31 MED ORDER — SODIUM CHLORIDE 0.9 % IV SOLN
INTRAVENOUS | Status: DC
  Administered 2012-05-31: 21:00:00 via INTRAVENOUS

## 2012-05-31 MED ORDER — HEPARIN (PORCINE) IN NACL 100-0.45 UNIT/ML-% IJ SOLN
1000.0000 [IU]/h | INTRAMUSCULAR | Status: DC
  Administered 2012-05-31 – 2012-06-01 (×3): 1000 [IU]/h via INTRAVENOUS
  Filled 2012-05-31 (×2): qty 250

## 2012-05-31 MED ORDER — MAGNESIUM HYDROXIDE 400 MG/5ML PO SUSP
30.0000 mL | Freq: Every day | ORAL | Status: DC | PRN
Start: 2012-05-31 — End: 2012-06-02
  Administered 2012-05-31: 30 mL via ORAL

## 2012-05-31 MED ORDER — IOHEXOL 300 MG/ML  SOLN
25.0000 mL | INTRAMUSCULAR | Status: AC
  Administered 2012-05-31 (×2): 25 mL via ORAL

## 2012-05-31 MED ORDER — ONDANSETRON HCL 4 MG/2ML IJ SOLN: 4.0000 mg | Freq: Four times a day (QID) | INTRAMUSCULAR | Status: DC | PRN

## 2012-05-31 MED ORDER — SODIUM CHLORIDE 0.9 % IJ SOLN: 3.0000 mL | Freq: Two times a day (BID) | INTRAMUSCULAR | Status: DC

## 2012-05-31 MED ORDER — SODIUM CHLORIDE 0.9 % IJ SOLN: 3.0000 mL | INTRAMUSCULAR | Status: DC | PRN

## 2012-05-31 MED ORDER — IOHEXOL 300 MG/ML  SOLN
100.0000 mL | Freq: Once | INTRAMUSCULAR | Status: AC | PRN
  Administered 2012-05-31: 100 mL via INTRAVENOUS

## 2012-05-31 MED ORDER — ZOLPIDEM TARTRATE 5 MG PO TABS
5.0000 mg | ORAL_TABLET | Freq: Every evening | ORAL | Status: DC | PRN
  Administered 2012-05-31 – 2012-06-01 (×2): 5 mg via ORAL
  Filled 2012-05-31 (×2): qty 1

## 2012-05-31 MED ORDER — ACETAMINOPHEN 325 MG PO TABS: 650.0000 mg | ORAL_TABLET | ORAL | Status: DC | PRN

## 2012-05-31 MED ORDER — SODIUM CHLORIDE 0.9 % IV SOLN: 250.0000 mL | INTRAVENOUS | Status: DC | PRN

## 2012-05-31 NOTE — Care Management Note (Unsigned)
    Page 1 of 2   06/14/2012     4:04:12 PM   CARE MANAGEMENT NOTE 06/14/2012  Patient:  Robert [name], Robert [name]   Account Number:  [id number]  Date Initiated:  05/31/2012  Documentation initiated by:  Junius Creamer  Subjective/Objective Assessment:   adm w heart failure     Action/Plan:   lives alone   Anticipated DC Date:  06/16/2012   Anticipated DC Plan:  HOME W HOME HEALTH SERVICES      DC Planning Services  CM consult      [facility] Choice  Resumption Of Svcs/PTA Lawayne Hartig   Choice offered to / List presented to:  C-1 Patient   DME arranged  WALKER - ROLLING  SHOWER STOOL      DME agency  [facility]     HH arranged  HH-1 RN  HH-2 PT      [facility] agency  [facility]   Status of service:  In process, will continue to follow Medicare Important Message given?   (If response is "NO", the following Medicare IM given date fields will be blank) Date Medicare IM given:   Date Additional Medicare IM given:    Discharge Disposition:  HOME W HOME HEALTH SERVICES  Per UR Regulation:  Reviewed for med. necessity/level of care/duration of stay  If discussed at Long Length of Stay Meetings, dates discussed:    Comments:  06/14/12 JULIE AMERSON,RN,BSN [phone] MET WITH PT TO DISCUSS DC PLANS.  PT STATES MOM AND DAD TO MOVE IN WITH HIM AT DISCHARGE, AND SISTER TO LIVE NEARBY. REFERRAL TO [facility], PER PT CHOICE, FOR HH CARE FOLLOW UP.  PT WILL ALSO NEED ROLLATOR WALKER AND SHOWER CHAIR.  WILL FOLLOW FOR ADDITIONAL NEEDS.  06/12/12 JULIE AMERSON,RN,BSN [phone] REFERRAL TO [facility] FOR HH FOLLOW UP AT DC.  PT WILL NEED HHRN FOR CONT STERILE DRESSING CHANGE MONITORING AND EDUCATION AND HHPT.  TARGET DC PREDICTED END OF WEEK.  06-06-12 4pm Avie Arenas, RNBSN [phone] Lives at home alone, but Parents - Mom and Dad , and sister planning to come and assist  him on discharge.  CM will continue to follow for further discharge needs.  06-02-12 4pm Avie Arenas, RNBSN [phone]  [phone] Post op Lvad on 2-28.  Now progressing. remains on milrinone and dopamine.  Due to sons voiced concerns about family assistance post discharge - may be excellent Ltach candidate.  [facility] has been trained to care for Lvad patients.  2/27 1614 debbie dowell rn,bsn soc worker doing assessment for lvad. surg for 2-28. pt's parents and sister have agreed to be support for pt.  2/26 0828 debbie dowell rn,bsn pt being evaled for lvad, he's act w [employer] for home iv milrinone. [employer] rep # [id number].

## 2012-05-31 NOTE — Progress Notes (Signed)
Advanced Heart Failure Rounding Note  Primary Cardiologist: Dr. Royann Shivers  Subjective:    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 Cath 2011 showed normal coronaries. He also has a history of VTach.   He is followed very closely in Heart Failure Clinic. In December he was referred to the Transplant Clinic at [facility] as his symptoms began to worsen, we were unable to titrate meds due to hypotension and frequent NSVT. He underwent transplant evaluation, RHC showed low output with high filling pressures. He was diuresed and placed on home inotropes. Admitted to the hosital 2/7-2/9 for decompensating heart failure symptoms and his metoprolol was decreased. He was feeling improvement with milrinone at home until the last couple of days.   Yesterday had RHC (05/30/12)  RA = 1  RV = 35/0/3  PA = 41/21 (30)  PCW = 25 v-wave = 45  Fick cardiac output/index = 3.9/1.99  Thermo CO/CO = 3.5/1.9  PVR = 1.4 Woods (Fick)  FA sat =100%  PA sat = 64%, 67%  He was admitted for low-output symptoms and planned bridge VAD. Overnight patient experienced runs of NSVT, became diaphoretic, and SBP dropped into the 60's, CVP 4. His milrinone was stopped and 250 cc bolus given. Patients BP now 90-100/70-80s. Patient reports this morning that he feels terrible. Very weak. + nausea and fatigue. Denies SOB/orthopnea or edema.   PA    64/35 CVP  9   Objective:   Weight Range:  Vital Signs:   Temp:  [97.2 F (36.2 C)-98.4 F (36.9 C)] 97.5 F (36.4 C) (02/26 0700) Pulse Rate:  [46-101] 94 (02/26 0700) Resp:  [18-32] 30 (02/26 0700) BP: (76-119)/(58-88) 102/85 mmHg (02/26 0700) SpO2:  [90 %-100 %] 98 % (02/26 0700) Weight:  [200 lb (90.719 kg)-204 lb 2.3 oz (92.6 kg)] 204 lb 2.3 oz (92.6 kg) (02/26 0600)    Weight change: Filed Weights   05/30/12 1235  05/30/12 1254 05/31/12 0600  Weight: 200 lb (90.719 kg) 200 lb (90.719 kg) 204 lb 2.3 oz (92.6 kg)    Intake/Output:   Intake/Output Summary (Last 24 hours) at 05/31/12 0731 Last data filed at 05/31/12 0600  Gross per 24 hour  Intake    377 ml  Output    400 ml  Net    -23 ml     Physical Exam: General:  Chronically ill appearing. No resp difficulty. Weak.  HEENT: normal Neck: supple. JVP jaw. Carotids 2+ bilat; no bruits. No lymphadenopathy or thryomegaly appreciated. Cor: PMI laterally displaced. Regular rate & rhythm. 3/6 MR. +s3  Lungs: clear Abdomen: soft, nontender, nondistended. No hepatosplenomegaly. No bruits or masses. Good bowel sounds. Extremities: cool. no cyanosis, clubbing, rash, edema Neuro: alert & orientedx3, cranial nerves grossly intact. moves all 4 extremities w/o difficulty. Affect fatigued  Telemetry: SR 90s bpm with frequent PVCs/NSVT  Labs: Basic Metabolic Panel:  Recent Labs Lab 05/29/12 1243 05/30/12 1218 05/30/12 2126 05/31/12 0440  NA 135 138  --  137  K 3.4* 2.9*  --  3.6  CL 96 98  --  101  CO2 27 27  --  26  GLUCOSE 123* 93  --  116*  BUN 17 18  --  17  CREATININE 1.19 1.25 1.05 1.00  CALCIUM 9.8 9.6  --  9.0  MG 2.3  --   --   --  Liver Function Tests: No results found for this basename: AST, ALT, ALKPHOS, BILITOT, PROT, ALBUMIN,  in the last 168 hours No results found for this basename: LIPASE, AMYLASE,  in the last 168 hours No results found for this basename: AMMONIA,  in the last 168 hours  CBC:  Recent Labs Lab 05/30/12 1218 05/30/12 2126  WBC 8.6 9.2  HGB 14.9 13.5  HCT 42.5 39.1  MCV 86.4 85.9  PLT 173 158    Cardiac Enzymes: No results found for this basename: CKTOTAL, CKMB, CKMBINDEX, TROPONINI,  in the last 168 hours  BNP: BNP (last 3 results)  Recent Labs  08/05/11 0648  PROBNP 2992.0*     Other results:  EKG:   Imaging:  No results found.   Medications:     Scheduled  Medications: . aspirin EC  81 mg Oral Daily  . digoxin  0.125 mg Oral Daily  . enoxaparin (LOVENOX) injection  40 mg Subcutaneous Q24H  . metoprolol succinate  25 mg Oral Daily  . sodium chloride  3 mL Intravenous Q12H  . torsemide  20 mg Oral Daily     Infusions: . milrinone Stopped (05/31/12 0100)     PRN Medications:  sodium chloride, acetaminophen, ondansetron (ZOFRAN) IV, oxyCODONE-acetaminophen, sodium chloride   Assessment:   1. A/C systolic HF - Class IV - on home milrinone  2. Cardiogenic shock  3. NICM EF 15-20% with severe MR  4. H/o HTN  5. Hypokalemia  6. Blood type O   Plan/Discussion:    Overnight patient's BP dropped, became diaphoretic, and was having NSVT. Milrinone stopped. Will get Co-ox now and try to restart milrinone. Patient to CT today for pre-op scans. Will not give beta blocker or digoxin today. Supplement K+  Length of Stay: 1 [name] 05/31/2012, 7:31 AM  Patient seen and examined with Ulla Potash, NP. We discussed all aspects of the encounter. I agree with the assessment and plan as stated above.   He appears hypoperfused. Co-ox borderline. Extremities are cool. Fortunately renal function is OK. Will start low-dose dobutamine 2.5 mcg/kg/min. Stop b-blocker completely. Recheck co-ox this afternoon. If < 55% will need IABP. Plan VAD Friday.   Reuel Boom Bensimhon,MD 12:47 PM

## 2012-05-31 NOTE — Progress Notes (Addendum)
ANTICOAGULATION CONSULT NOTE - Initial Consult  Pharmacy Consult for heparin Indication: IABP   No Known Allergies  Patient Measurements: Height: 6\' 2"  (188 cm) Weight: 204 lb 2.3 oz (92.6 kg) IBW/kg (Calculated) : 82.2 Heparin Dosing Weight: 92.6 kg  Vital Signs: Temp: 97.9 F (36.6 C) (02/26 1415) Temp src: Core (Comment) (02/26 1200) BP: 110/88 mmHg (02/26 1500) Pulse Rate: 56 (02/26 1500)  Labs:  Recent Labs  05/30/12 1218 05/30/12 2126 05/31/12 0440  HGB 14.9 13.5  --   HCT 42.5 39.1  --   PLT 173 158  --   LABPROT 13.6  --   --   INR 1.05  --   --   CREATININE 1.25 1.05 1.00    Estimated Creatinine Clearance: 95.9 ml/min (by C-G formula based on Cr of 1).   Medical History: Past Medical History  Diagnosis Date  . AICD (automatic cardioverter/defibrillator) present   . Hypertension   . Congestive heart failure   . Pneumonia   . Bronchitis     Medications:  Prescriptions prior to admission  Medication Sig Dispense Refill  . aspirin 81 MG chewable tablet Chew 81 mg by mouth daily.      . digoxin (LANOXIN) 0.125 MG tablet Take 0.125 mg by mouth daily.      Marland Kitchen eplerenone (INSPRA) 50 MG tablet Take 0.5 tablets (25 mg total) by mouth daily.  15 tablet  3  . metoprolol succinate (TOPROL-XL) 25 MG 24 hr tablet Take 1 tablet (25 mg total) by mouth daily.  30 tablet  3  . sodium chloride 0.9 % SOLN with milrinone 1 MG/ML SOLN 200 mcg/mL Inject 0.25 mcg/kg/min into the vein continuous.      . torsemide (DEMADEX) 20 MG tablet Take 1 tablet (20 mg total) by mouth 2 (two) times daily.  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  Admitted for HF exacerbation,  IABP placed with tentative plans for LVAD placement Friday.  Received lovenox 40 mg SQ x 1 today at 0804 am and ~4000 units heparin in the cath lab this afternoon.   INR WNL at baseline.  CBC noted to be WNL --  plt low at 158.  Goal of Therapy:  Heparin level goal 0.2-0.5 Monitor platelets by anticoagulation protocol: Yes   Plan:  - Start heparin now at rate of 1000 units/hr (~11 units/kg/hr) -- NO BOLUS - Check heparin level in 6h hours - Check daily heparin level and CBC  Shirley Bolle L. Illene Bolus, PharmD, BCPS Clinical Pharmacist Pager: [phone] Pharmacy: [phone] 05/31/2012 5:16 PM    Addendum: original order was to start at 800 units/hr then RX to adjust - will change to 800 units/hr now.  Alven Alverio L. Illene Bolus, PharmD, BCPS Clinical Pharmacist Pager: [phone] Pharmacy: [phone] 05/31/2012 7:13 PM

## 2012-05-31 NOTE — Progress Notes (Signed)
Dr Teressa Lower updated on pt status and co-ox results. No orders received.

## 2012-05-31 NOTE — Progress Notes (Signed)
CTSP secondary to hypotension.  The patient has a history of HTN and advanced CHF secondary to nonischemic DCM with EF 15-20% and severe MR.  He had a cath in 2011 with normal coronary arteries.  He is followed at [facility] and is being listed for transplant.  He was recently admitted with syncope and hypotension due to increased bete blocker.  He underwent RHC today showing high left sided filling pressures with low cardiac output on Milrinone and prominent v waves in the PCW secdonary to severe MR.  His lungs are clear and O2 sats are 98% on RA.  Tonight he became diaphoretic and BP dropped to 67/65mmHg.  His milrinone was placed on hold.  His CVP was noted with a drop in his PAP to 30/89mmHg.  He was given a saline bolus of 250cc.  His BP is now 91/52mmHg and he is feeling better.  Will continue to hold milrinone overnight.  CVP currently 12.  Hold beta blocker and diuretics for now.  May need to place IABP if pressure starts to drop again.

## 2012-05-31 NOTE — CV Procedure (Signed)
Cardiac Cath Procedure Note:  Indication: Cardiogenic shock  Procedures performed:  1) IABP placement  Description of procedure:   The risks and indication of the procedure were explained. Consent was signed and placed on the chart. An appropriate timeout was taken prior to the procedure. The right groin was prepped and draped in the routine sterile fashion and anesthetized with 1% local lidocaine.   A 5 FR arterial sheath was placed in the right femoral artery using a modified Seldinger technique. The 5FR was then changed out for a 7.5 FR IABP sheath. Once the sheath was in place 4000U of systemic heparin was administered. A 40 cm IABP balloon was then inserted under fluoroscopic guidance. Once adequate position was confirmed, the IABP was flushed and filled and counterpulsation began with an excellent augmentation waveform.   Complications:  None apparent  Assessment:  1) Successful IABP placement in patient with worsening cardiogenic shock despite inotropic support (INTERMACS-II)  Plan/Discussion:  Will support with IABP. Probable LVAD placement on Friday.  Daniel Bensimhon 4:30 PM

## 2012-05-31 NOTE — Progress Notes (Signed)
In to see patient at this time, at the current time the patient states "I dont feel so good." Upon assessment the patient is diaphoretic, lethargic, and states "im burning up." At this time the patients vs are SR 50's, SBP 60/30, PA 26-30/PAD 7-10, CVP 4. At this time patients Milrone is placed on hold, MD Turner paged at this time. Nurse at bedside till return page. New orders obtained at this time, 250 cc NS bolus given to patient at this time. Status post fluid bolus patient HR SR 70-80's, SBP 90-95/50-60's, and PA's 50-55/PAD 25-30. Patient at this time states that he feels better. Will continue to monitor the patient.

## 2012-05-31 NOTE — Progress Notes (Signed)
Orthopedic Tech Progress Note Patient Details:  Robert [name] 10/17/1955 [id number]  Ortho Devices Type of Ortho Device: Knee Immobilizer Ortho Device/Splint Location: (R) LE Ortho Device/Splint Interventions: Application   Jennye Moccasin 05/31/2012, 7:14 PM

## 2012-05-31 NOTE — Progress Notes (Signed)
Ulla Potash NP notified of Co-ox result (42.9) Also, HR up into 120's. Order received to stop dobutamine gtt.

## 2012-05-31 NOTE — Progress Notes (Signed)
In to see patient at this time, patient is noted to have NSVT ranging from 3-5 beats. MD Bensimhon notified of patient episodes of NSVT. Patient asymptomatic and vital signs are currently  stable. No new orders obtained at this time. Will continue to monitor the patient.

## 2012-05-31 NOTE — Progress Notes (Signed)
1 Day Post-Op Procedure(s) (LRB): RIGHT HEART CATH (N/A) Subjective: Class 4 HF- idiopathic CM Failing home milrrinone w/ fatigue, abd discomfort, orthopnea Now in CCU for acute on chronic systolic HF  Objective: Vital signs in last 24 hours: Temp:  [97.2 F (36.2 C)-98.4 F (36.9 C)] 98.1 F (36.7 C) (02/26 1000) Pulse Rate:  [46-101] 52 (02/26 1000) Cardiac Rhythm:  [-] Heart block (02/26 0800) Resp:  [9-32] 9 (02/26 1000) BP: (76-119)/(58-88) 91/70 mmHg (02/26 1000) SpO2:  [90 %-100 %] 100 % (02/26 1000) Weight:  [200 lb (90.719 kg)-204 lb 2.3 oz (92.6 kg)] 204 lb 2.3 oz (92.6 kg) (02/26 0600)  Hemodynamic parameters for last 24 hours: PAP: (35-76)/(19-43) 58/32 mmHg CVP:  [5 mmHg-11 mmHg] 8 mmHg  NSR  Intake/Output from previous day: 02/25 0701 - 02/26 0700 In: 377 [P.O.:340; I.V.:37] Out: 400 [Urine:400] Intake/Output this shift: Total I/O In: 290 [P.O.:250; I.V.:40] Out: -   EXAM 3/6 MR murmur, + S3 gallop Lungs clear extrem warm  Lab Results:  Recent Labs  05/30/12 1218 05/30/12 2126  WBC 8.6 9.2  HGB 14.9 13.5  HCT 42.5 39.1  PLT 173 158   BMET:  Recent Labs  05/30/12 1218 05/30/12 2126 05/31/12 0440  NA 138  --  137  K 2.9*  --  3.6  CL 98  --  101  CO2 27  --  26  GLUCOSE 93  --  116*  BUN 18  --  17  CREATININE 1.25 1.05 1.00  CALCIUM 9.6  --  9.0    PT/INR:  Recent Labs  05/30/12 1218  LABPROT 13.6  INR 1.05   ABG    Component Value Date/Time   PHART 7.478* 08/17/2011 1129   HCO3 25.3* 05/30/2012 1912   HCO3 26.4* 05/30/2012 1912   TCO2 27 05/30/2012 1912   TCO2 28 05/30/2012 1912   ACIDBASEDEF 1.0 08/17/2011 1129   O2SAT 56.8 05/31/2012 0815   CBG (last 3)  No results found for this basename: GLUCAP,  in the last 72 hours  Assessment/Plan: S/P Procedure(s) (LRB): RIGHT HEART CATH (N/A) LVAD planned for this admission Recheck ECHo for RV, TR preop Procedure d/w patient  LOS: 1 day    VAN TRIGT  III,Marthella Osorno 05/31/2012

## 2012-06-01 ENCOUNTER — Inpatient Hospital Stay (HOSPITAL_COMMUNITY): Payer: BC Managed Care – PPO

## 2012-06-01 ENCOUNTER — Encounter (HOSPITAL_COMMUNITY): Payer: Self-pay | Admitting: Anesthesiology

## 2012-06-01 DIAGNOSIS — R570 Cardiogenic shock: Secondary | ICD-10-CM

## 2012-06-01 DIAGNOSIS — I428 Other cardiomyopathies: Secondary | ICD-10-CM

## 2012-06-01 DIAGNOSIS — Z01810 Encounter for preprocedural cardiovascular examination: Secondary | ICD-10-CM

## 2012-06-01 DIAGNOSIS — R5381 Other malaise: Secondary | ICD-10-CM

## 2012-06-01 DIAGNOSIS — I059 Rheumatic mitral valve disease, unspecified: Secondary | ICD-10-CM

## 2012-06-01 LAB — BASIC METABOLIC PANEL
BUN: 17 mg/dL (ref 6–23)
CO2: 28 mEq/L (ref 19–32)
GFR calc non Af Amer: 71 mL/min — ABNORMAL LOW (ref >90)
Glucose, Bld: 91 mg/dL (ref 70–99)
Potassium: 4.1 mEq/L (ref 3.5–5.1)

## 2012-06-01 LAB — LUPUS ANTICOAGULANT PANEL
DRVVT: 28.2 secs (ref <42.9)
Lupus Anticoagulant: NOT DETECTED
PTT Lupus Anticoagulant: 37.7 secs (ref 28.0–43.0)

## 2012-06-01 LAB — COMPREHENSIVE METABOLIC PANEL
ALT: 11 U/L (ref 0–53)
AST: 12 U/L (ref 0–37)
Albumin: 3.1 g/dL — ABNORMAL LOW (ref 3.5–5.2)
Alkaline Phosphatase: 56 U/L (ref 39–117)
Chloride: 101 mEq/L (ref 96–112)
Potassium: 4.2 mEq/L (ref 3.5–5.1)
Sodium: 135 mEq/L (ref 135–145)
Total Bilirubin: 1.2 mg/dL (ref 0.3–1.2)
Total Protein: 5.9 g/dL — ABNORMAL LOW (ref 6.0–8.3)

## 2012-06-01 LAB — CBC
HCT: 34.5 % — ABNORMAL LOW (ref 39.0–52.0)
Platelets: 110 10*3/uL — ABNORMAL LOW (ref 150–400)
RDW: 13.2 % (ref 11.5–15.5)
WBC: 7.6 10*3/uL (ref 4.0–10.5)

## 2012-06-01 LAB — HEPARIN LEVEL (UNFRACTIONATED)
Heparin Unfractionated: 0.2 IU/mL — ABNORMAL LOW (ref 0.30–0.70)
Heparin Unfractionated: 0.17 IU/mL — ABNORMAL LOW (ref 0.30–0.70)
Heparin Unfractionated: 0.18 IU/mL — ABNORMAL LOW (ref 0.30–0.70)

## 2012-06-01 LAB — PROTEIN C ACTIVITY: Protein C Activity: 123 % (ref 75–133)

## 2012-06-01 LAB — PREPARE RBC (CROSSMATCH)

## 2012-06-01 LAB — PROCALCITONIN: Procalcitonin: <0.1 ng/mL

## 2012-06-01 LAB — CARBOXYHEMOGLOBIN: Carboxyhemoglobin: 1.6 % — ABNORMAL HIGH (ref 0.5–1.5)

## 2012-06-01 LAB — ABO/RH: ABO/RH(D): O POS

## 2012-06-01 LAB — APTT: aPTT: 65 seconds — ABNORMAL HIGH (ref 24–37)

## 2012-06-01 LAB — PROTEIN S ACTIVITY: Protein S Activity: 75 % (ref 69–129)

## 2012-06-01 LAB — HOMOCYSTEINE: Homocysteine: 10.1 umol/L (ref 4.0–15.4)

## 2012-06-01 MED ORDER — SODIUM CHLORIDE 0.9 % IV SOLN
INTRAVENOUS | Status: DC
  Filled 2012-06-01: qty 40

## 2012-06-01 MED ORDER — CEFUROXIME SODIUM 750 MG IJ SOLR
750.0000 mg | INTRAMUSCULAR | Status: DC
  Filled 2012-06-01: qty 750

## 2012-06-01 MED ORDER — RIFAMPIN 300 MG PO CAPS
600.0000 mg | ORAL_CAPSULE | Freq: Once | ORAL | Status: DC
  Filled 2012-06-01: qty 2

## 2012-06-01 MED ORDER — FUROSEMIDE 10 MG/ML IJ SOLN
20.0000 mg | Freq: Once | INTRAMUSCULAR | Status: AC
  Administered 2012-06-01: 20 mg via INTRAVENOUS

## 2012-06-01 MED ORDER — BISACODYL 5 MG PO TBEC
5.0000 mg | DELAYED_RELEASE_TABLET | Freq: Once | ORAL | Status: AC
  Administered 2012-06-01: 5 mg via ORAL
  Filled 2012-06-01: qty 1

## 2012-06-01 MED ORDER — SODIUM CHLORIDE 0.9 % IV SOLN
0.0300 [IU]/min | Freq: Once | INTRAVENOUS | Status: DC
  Filled 2012-06-01: qty 2.5

## 2012-06-01 MED ORDER — SODIUM CHLORIDE 0.9 % IV SOLN
INTRAVENOUS | Status: DC
  Filled 2012-06-01: qty 1

## 2012-06-01 MED ORDER — POTASSIUM CHLORIDE 10 MEQ/50ML IV SOLN: 10.0000 meq | INTRAVENOUS | Status: DC

## 2012-06-01 MED ORDER — TEMAZEPAM 15 MG PO CAPS: 15.0000 mg | ORAL_CAPSULE | Freq: Once | ORAL | Status: DC | PRN

## 2012-06-01 MED ORDER — MILRINONE IN DEXTROSE 20 MG/100ML IV SOLN
0.1250 ug/kg/min | INTRAVENOUS | Status: DC
  Administered 2012-06-01: 0.125 ug/kg/min via INTRAVENOUS
  Filled 2012-06-01 (×2): qty 100

## 2012-06-01 MED ORDER — POTASSIUM CHLORIDE CRYS ER 20 MEQ PO TBCR
40.0000 meq | EXTENDED_RELEASE_TABLET | Freq: Once | ORAL | Status: AC
  Administered 2012-06-01: 40 meq via ORAL
  Filled 2012-06-01: qty 2

## 2012-06-01 MED ORDER — PERFLUTREN LIPID MICROSPHERE
1.0000 mL | INTRAVENOUS | Status: AC | PRN
  Administered 2012-06-01 (×3): 2 mL via INTRAVENOUS
  Filled 2012-06-01: qty 10

## 2012-06-01 MED ORDER — MAGNESIUM SULFATE 50 % IJ SOLN
40.0000 meq | INTRAMUSCULAR | Status: DC
  Filled 2012-06-01: qty 10

## 2012-06-01 MED ORDER — FLUCONAZOLE IN SODIUM CHLORIDE 400-0.9 MG/200ML-% IV SOLN
400.0000 mg | INTRAVENOUS | Status: AC
  Administered 2012-06-02: 400 mg via INTRAVENOUS
  Filled 2012-06-01: qty 200

## 2012-06-01 MED ORDER — MILRINONE IN DEXTROSE 20 MG/100ML IV SOLN
0.2500 ug/kg/min | Freq: Once | INTRAVENOUS | Status: DC
  Filled 2012-06-01: qty 100

## 2012-06-01 MED ORDER — POTASSIUM CHLORIDE 2 MEQ/ML IV SOLN
80.0000 meq | INTRAVENOUS | Status: DC
  Filled 2012-06-01: qty 40

## 2012-06-01 MED ORDER — CHLORHEXIDINE GLUCONATE CLOTH 2 % EX PADS
6.0000 | MEDICATED_PAD | Freq: Once | CUTANEOUS | Status: AC
  Administered 2012-06-02: 6 via TOPICAL

## 2012-06-01 MED ORDER — MUPIROCIN 2 % EX OINT
1.0000 "application " | TOPICAL_OINTMENT | Freq: Two times a day (BID) | CUTANEOUS | Status: DC
  Administered 2012-06-01: 1 via NASAL
  Filled 2012-06-01: qty 22

## 2012-06-01 MED ORDER — DOBUTAMINE IN D5W 4-5 MG/ML-% IV SOLN
2.5000 ug/kg/min | Freq: Once | INTRAVENOUS | Status: DC
  Filled 2012-06-01: qty 250

## 2012-06-01 MED ORDER — DEXTROSE 5 % IV SOLN
1.5000 g | INTRAVENOUS | Status: AC
  Administered 2012-06-02: .75 g via INTRAVENOUS
  Administered 2012-06-02: 1.5 g via INTRAVENOUS
  Filled 2012-06-01: qty 1.5

## 2012-06-01 MED ORDER — NITROGLYCERIN IN D5W 200-5 MCG/ML-% IV SOLN
0.0000 ug/min | INTRAVENOUS | Status: DC
  Filled 2012-06-01: qty 250

## 2012-06-01 MED ORDER — HEPARIN (PORCINE) IN NACL 100-0.45 UNIT/ML-% IJ SOLN
1350.0000 [IU]/h | INTRAMUSCULAR | Status: DC
  Administered 2012-06-01: 1200 [IU]/h via INTRAVENOUS
  Filled 2012-06-01 (×3): qty 250

## 2012-06-01 MED ORDER — CHLORHEXIDINE GLUCONATE 4 % EX LIQD
60.0000 mL | Freq: Once | CUTANEOUS | Status: AC
  Administered 2012-06-01: 4 via TOPICAL
  Filled 2012-06-01: qty 60

## 2012-06-01 MED ORDER — DIAZEPAM 5 MG PO TABS
5.0000 mg | ORAL_TABLET | ORAL | Status: AC
  Administered 2012-06-02: 5 mg via ORAL
  Filled 2012-06-01: qty 1

## 2012-06-01 MED ORDER — DEXTROSE 5 % IV SOLN
2.0000 ug/min | Freq: Once | INTRAVENOUS | Status: DC
  Filled 2012-06-01: qty 8

## 2012-06-01 MED ORDER — CHLORHEXIDINE GLUCONATE CLOTH 2 % EX PADS
6.0000 | MEDICATED_PAD | Freq: Once | CUTANEOUS | Status: AC
  Administered 2012-06-01: 6 via TOPICAL

## 2012-06-01 MED ORDER — SODIUM CHLORIDE 0.9 % IV SOLN
1500.0000 mg | INTRAVENOUS | Status: AC
  Administered 2012-06-02: 1500 mg via INTRAVENOUS
  Filled 2012-06-01: qty 1500

## 2012-06-01 MED ORDER — FUROSEMIDE 10 MG/ML IJ SOLN
INTRAMUSCULAR | Status: AC
  Filled 2012-06-01: qty 4

## 2012-06-01 MED ORDER — VANCOMYCIN HCL 1000 MG IV SOLR
1000.0000 mg | INTRAVENOUS | Status: AC
  Administered 2012-06-02: 1000 mg
  Filled 2012-06-01: qty 1000

## 2012-06-01 MED ORDER — CHLORHEXIDINE GLUCONATE 4 % EX LIQD
60.0000 mL | Freq: Once | CUTANEOUS | Status: AC
  Administered 2012-06-02: 4 via TOPICAL
  Filled 2012-06-01: qty 60

## 2012-06-01 MED ORDER — PERFLUTREN LIPID MICROSPHERE
INTRAVENOUS | Status: AC
  Filled 2012-06-01: qty 10

## 2012-06-01 MED ORDER — EPINEPHRINE HCL 1 MG/ML IJ SOLN
0.0000 ug/min | INTRAVENOUS | Status: AC
  Administered 2012-06-02: 3 ug/min via INTRAVENOUS
  Filled 2012-06-01: qty 4

## 2012-06-01 MED ORDER — DOPAMINE-DEXTROSE 3.2-5 MG/ML-% IV SOLN
0.0000 ug/kg/min | INTRAVENOUS | Status: AC
  Administered 2012-06-02: 3 ug/kg/min via INTRAVENOUS
  Filled 2012-06-01: qty 250

## 2012-06-01 MED ORDER — PHENYLEPHRINE HCL 10 MG/ML IJ SOLN
0.0000 ug/min | INTRAVENOUS | Status: AC
  Administered 2012-06-02: 13.33 ug/min via INTRAVENOUS
  Filled 2012-06-01: qty 2

## 2012-06-01 MED ORDER — DEXMEDETOMIDINE HCL IN NACL 400 MCG/100ML IV SOLN
0.1000 ug/kg/h | INTRAVENOUS | Status: AC
  Administered 2012-06-02: 0.3 ug/kg/h via INTRAVENOUS
  Filled 2012-06-01: qty 100

## 2012-06-01 NOTE — Progress Notes (Signed)
Patient has an intra aortic balloon pump but has no foley to monitor for urine output and balloon migration hourly. Physician extender(Tony Shaune Spittle) informed about situation but no order given for foley placement. Discussed my concern with patient who was reluctant to the insertion of foley.Will continue to urge patient to void.

## 2012-06-01 NOTE — Progress Notes (Signed)
Dr. Gala Romney notified of radiologist report of CXR/IABP placement.

## 2012-06-01 NOTE — Progress Notes (Signed)
INITIAL NUTRITION ASSESSMENT  DOCUMENTATION CODES Per approved criteria  -Not Applicable   INTERVENTION: 1. Add Ensure Complete once daily after surgery 2. RD will continue to follow  NUTRITION DIAGNOSIS: Expected sub-optimal po intake related to pain/sedations as evidenced by planned VAD placement.   Goal: PO intake to meet >/=90% estimated nutrition needs to promote post op healing  Monitor:  PO intake, weight trends, VAD placement  Reason for Assessment: Consult   57 y.o. male  Admitting Dx: CHF   ASSESSMENT: Pt admitted with end stage CHF, currently on transplant list. Pt is planned for VAD placement for bridge to transplant on Friday.  RD was consulted by HF team to assess nutrition status prior to VAD.  Pt states that appetite and weight have been normal. Was not eating as well since Sunday, but appetite is picking back up here in the hospital. Expect pt will have a poor appetitem and increased nutrition needs post op. Pt is open to nutrition supplements post surgery. Pt appears to be in good nutritional standing at this time.   Height: Ht Readings from Last 1 Encounters:  05/30/12 6\' 2"  (1.88 m)    Weight: Wt Readings from Last 1 Encounters:  06/01/12 212 lb 11.9 oz (96.5 kg)    Ideal Body Weight: 190 lbs   % Ideal Body Weight: 111%  Wt Readings from Last 10 Encounters:  06/01/12 212 lb 11.9 oz (96.5 kg)  06/01/12 212 lb 11.9 oz (96.5 kg)  06/01/12 212 lb 11.9 oz (96.5 kg)  05/29/12 207 lb 8 oz (94.121 kg)  05/14/12 201 lb 4.5 oz (91.3 kg)  03/30/12 218 lb 8 oz (99.111 kg)  02/17/12 216 lb (97.977 kg)  12/24/11 217 lb (98.431 kg)  12/20/11 217 lb 12.8 oz (98.793 kg)  11/29/11 216 lb 4 oz (98.09 kg)    Usual Body Weight: 202 lbs per pt report   % Usual Body Weight: 105%  BMI:  Body mass index is 27.3 kg/(m^2). Overweight   Estimated Nutritional Needs: Kcal: 2200-2400 Protein: 100-115 gm  Fluid: 1.5 L/day per fluid restriction  Skin: intact    Diet Order: Cardiac 85-100% meal completion this admission   EDUCATION NEEDS: -No education needs identified at this time   Intake/Output Summary (Last 24 hours) at 06/01/12 1101 Last data filed at 06/01/12 1000  Gross per 24 hour  Intake 1401.03 ml  Output   1336 ml  Net  65.03 ml    Last BM: 2/27   Labs:   Recent Labs Lab 05/29/12 1243 05/30/12 1218 05/30/12 2126 05/31/12 0440 06/01/12 0500  NA 135 138  --  137 135  K 3.4* 2.9*  --  3.6 4.1  CL 96 98  --  101 102  CO2 27 27  --  26 28  BUN 17 18  --  17 17  CREATININE 1.19 1.25 1.05 1.00 1.13  CALCIUM 9.8 9.6  --  9.0 8.7  MG 2.3  --   --   --   --   GLUCOSE 123* 93  --  116* 91    CBG (last 3)  No results found for this basename: GLUCAP,  in the last 72 hours  Scheduled Meds: . aspirin EC  81 mg Oral Daily  . digoxin  0.125 mg Oral Daily  . furosemide      . sodium chloride  3 mL Intravenous Q12H    Continuous Infusions: . sodium chloride 10 mL/hr at 05/31/12 2036  . sodium chloride  10 mL/hr at 05/31/12 2033  . sodium chloride 10 mL/hr at 05/31/12 2039  . heparin 1,000 Units/hr (06/01/12 0802)  . milrinone 0.125 mcg/kg/min (06/01/12 1025)    Past Medical History  Diagnosis Date  . AICD (automatic cardioverter/defibrillator) present   . Hypertension   . Congestive heart failure   . Pneumonia   . Bronchitis     Past Surgical History  Procedure Laterality Date  . Cardiac defibrillator placement    . Cardiac catheterization    . Colonoscopy  12/29/2011    Procedure: COLONOSCOPY;  Surgeon: Iva Boop, MD;  Location: [facility];  Service: Endoscopy;  Laterality: N/A;    Clarene Duke RD, LDN Pager [phone] After Hours pager [phone]

## 2012-06-01 NOTE — Progress Notes (Signed)
ANTICOAGULATION CONSULT NOTE - Initial Consult  Pharmacy Consult for heparin Indication: IABP   No Known Allergies  Patient Measurements: Height: 6\' 2"  (188 cm) Weight: 212 lb 11.9 oz (96.5 kg) IBW/kg (Calculated) : 82.2 Heparin Dosing Weight: 92.6 kg  Vital Signs: Temp: 98.4 F (36.9 C) (02/27 1300) BP: 93/62 mmHg (02/27 1200) Pulse Rate: 94 (02/27 1300)  Labs:  Recent Labs  05/30/12 1218 05/30/12 2126 05/31/12 0440 06/01/12 06/01/12 0500 06/01/12 1050 06/01/12 1400  HGB 14.9 13.5  --   --  11.9*  --   --   HCT 42.5 39.1  --   --  34.5*  --   --   PLT 173 158  --   --  110*  --   --   LABPROT 13.6  --   --   --   --   --   --   INR 1.05  --   --   --   --   --   --   HEPARINUNFRC  --   --   --  0.20* 0.17*  --  0.14*  CREATININE 1.25 1.05 1.00  --  1.13 1.19  --     Estimated Creatinine Clearance: 80.6 ml/min (by C-G formula based on Cr of 1.19).   Medical History: Past Medical History  Diagnosis Date  . AICD (automatic cardioverter/defibrillator) present   . Hypertension   . Congestive heart failure   . Pneumonia   . Bronchitis     Medications:  Prescriptions prior to admission  Medication Sig Dispense Refill  . aspirin 81 MG chewable tablet Chew 81 mg by mouth daily.      . digoxin (LANOXIN) 0.125 MG tablet Take 0.125 mg by mouth daily.      Marland Kitchen eplerenone (INSPRA) 50 MG tablet Take 0.5 tablets (25 mg total) by mouth daily.  15 tablet  3  . metoprolol succinate (TOPROL-XL) 25 MG 24 hr tablet Take 1 tablet (25 mg total) by mouth daily.  30 tablet  3  . sodium chloride 0.9 % SOLN with milrinone 1 MG/ML SOLN 200 mcg/mL Inject 0.25 mcg/kg/min into the vein continuous.      . torsemide (DEMADEX) 20 MG tablet Take 1 tablet (20 mg total) by mouth 2 (two) times daily.  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  Admitted for HF exacerbation,  IABP placed with  plans for LVAD placement tomorrow.  INR WNL at baseline. Heparin level this AM below goal range at 0.14. Hgb has decreased to 11.9, plts down to 110. No infusion line or bleeding issues per RN.  Goal of Therapy:  Heparin level goal 0.2-0.5 Monitor platelets by anticoagulation protocol: Yes   Plan:  - Increase heparin to a rate of 1200 units/hr   - Recheck heparin level in 6h hours - Check daily heparin level and CBC - Heparin to be turned off on call to OR 2/28  Thank you for the consult.  Sheppard Coil, PharmD Clinical Pharmacist Pager: [phone] Pharmacy: [phone] 06/01/2012 3:08 PM

## 2012-06-01 NOTE — Progress Notes (Signed)
Advanced Heart Failure Rounding Note  Primary Cardiologist: Dr. 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 Cath 2011 showed normal coronaries. He also has a history of VTach.   He is followed very closely in [facility] Failure [facility]. In December he was referred to the Transplant Clinic at [facility] as his symptoms began to worsen, we were unable to titrate meds due to hypotension and frequent NSVT. He underwent transplant evaluation, RHC showed low output with high filling pressures. He was diuresed and placed on home inotropes. Admitted to the hosital 2/7-2/9 for decompensating heart failure symptoms and his metoprolol was decreased. He was feeling improvement with milrinone at home until the last couple of days.   RHC (05/30/12) RA = 1  RV = 35/0/3  PA = 41/21 (30)  PCW = 25 v-wave = 45  Fick cardiac output/index = 3.9/1.99  Thermo CO/CO = 3.5/1.9  PVR = 1.4 Woods (Fick)  FA sat =100%  PA sat = 64%, 67%  Admitted 2/25 for low-output symptoms and planned bridge VAD. Yesterday patient started declining, Co-ox dropped to 42 and he was taken to the cath lab for emergent balloon pump. He reports feeling much better and co-ox 74 this morning. Denies any SOB/orthopnea/CP. Goal to bridge with VAD tomorrow.   PAP      65/39 (48) CVP      12 PCWP   25 CO/CI    4.1/1.9 SVR       1708 PVR        446  Objective:   Weight Range:  Vital Signs:   Temp:  [97.7 F (36.5 C)-99 F (37.2 C)] 98.4 F (36.9 C) (02/27 0800) Pulse Rate:  [31-104] 95 (02/27 0800) Resp:  [9-38] 16 (02/27 0800) BP: (88-124)/(61-91) 110/68 mmHg (02/27 0800) SpO2:  [88 %-100 %] 98 % (02/27 0800) Weight:  [212 lb 11.9 oz (96.5 kg)] 212 lb 11.9 oz (96.5 kg) (02/27 0617)    Weight change: Filed Weights   05/30/12 1254 05/31/12 0600 06/01/12 0617   Weight: 200 lb (90.719 kg) 204 lb 2.3 oz (92.6 kg) 212 lb 11.9 oz (96.5 kg)    Intake/Output:   Intake/Output Summary (Last 24 hours) at 06/01/12 0844 Last data filed at 06/01/12 0802  Gross per 24 hour  Intake 1361.03 ml  Output   1335 ml  Net  26.03 ml     Physical Exam: General:  Looks good. No respiratory difficulty. Lying flat with IABP in place HEENT: normal Neck: supple. JVP 8. Carotids 2+ bilat; no bruits. No lymphadenopathy or thryomegaly appreciated. Cor: PMI laterally displaced. Regular rate & rhythm. 3/6 MR. +s3  Lungs: clear Abdomen: soft, nontender, nondistended. No hepatosplenomegaly. No bruits or masses. Good bowel sounds. Extremities: cool. no cyanosis, clubbing, rash, edema. R groin ok.  Neuro: alert & orientedx3, cranial nerves grossly intact. moves all 4 extremities w/o difficulty. Affect fatigued  Telemetry: SR 90-100s bpm with frequent PVCs/NSVT  Labs: Basic Metabolic Panel:  Recent Labs Lab 05/29/12 1243 05/30/12 1218 05/30/12 2126 05/31/12 0440 06/01/12 0500  NA 135 138  --  137 135  K 3.4* 2.9*  --  3.6 4.1  CL 96 98  --  101 102  CO2 27 27  --  26 28  GLUCOSE 123* 93  --  116* 91  BUN  17 18  --  17 17  CREATININE 1.19 1.25 1.05 1.00 1.13  CALCIUM 9.8 9.6  --  9.0 8.7  MG 2.3  --   --   --   --     Liver Function Tests: No results found for this basename: AST, ALT, ALKPHOS, BILITOT, PROT, ALBUMIN,  in the last 168 hours No results found for this basename: LIPASE, AMYLASE,  in the last 168 hours No results found for this basename: AMMONIA,  in the last 168 hours  CBC:  Recent Labs Lab 05/30/12 1218 05/30/12 2126 06/01/12 0500  WBC 8.6 9.2 7.6  HGB 14.9 13.5 11.9*  HCT 42.5 39.1 34.5*  MCV 86.4 85.9 86.5  PLT 173 158 110*    Cardiac Enzymes: No results found for this basename: CKTOTAL, CKMB, CKMBINDEX, TROPONINI,  in the last 168 hours  BNP: BNP (last 3 results)  Recent Labs  08/05/11 0648  PROBNP 2992.0*     Other  results:  EKG:   Imaging: Ct Chest W Contrast  05/31/2012  *RADIOLOGY REPORT*  Clinical Data:  Heart failure.  Preoperative study for possible left ventricular assist device (LVAD) placement.  CT CHEST, ABDOMEN AND PELVIS WITH CONTRAST  Technique:  Multidetector CT imaging of the chest, abdomen and pelvis was performed following the standard protocol during bolus administration of intravenous contrast.  Contrast: OMNIPAQUE IOHEXOL 300 MG/ML  SOLN  Comparison:  Chest CT 07/16/2009.  CT CHEST  Findings:  Mediastinum: The heart is markedly enlarged with severe left ventricular dilatation.  Myocardium appears relatively uniform in thickness without definite areas of thinning and remodelling to suggest scar from prior ischemic event.  Additionally, no significant atherosclerotic calcifications are noted within the thoracic aorta or the coronary arteries.  There is a Swan-Ganz catheter and positioned with tip extending into the right middle lobe pulmonary arteries.  Additionally, a pacemaker/AICD is present with lead tips terminating in the right atrial appendage and right ventricular apex. There is no significant pericardial fluid, thickening or pericardial calcification. No pathologically enlarged mediastinal or hilar lymph nodes. Esophagus is unremarkable in appearance.  Posterolateral to the proximal trachea on the right side there is a 2.2 x 1.7 cm intermediate attenuation (28 HU) collection of material, favored to represent a small resolving hematoma.  Numerous borderline enlarged mediastinal lymph nodes are noted, measuring up to 9 mm in short axis (these are nonspecific).  Lungs/Pleura: Mild diffuse ground glass attenuation and interlobular septal thickening in the lungs bilaterally is indicative of mild interstitial pulmonary edema.  4 mm left lower lobe nodule (image 44 of series 3).  No other larger more suspicious appearing pulmonary nodules or masses are otherwise noted. Trace right pleural  effusion layering dependently.  Musculoskeletal: There are no aggressive appearing lytic or blastic lesions noted in the visualized portions of the skeleton.  IMPRESSION:  1.  Cardiomegaly with severe left ventricular dilatation.  Given the absence of coronary artery calcification and lack of evidence of ventricular scarring or remodelling, this is favored to reflect a nonischemic cardiomyopathy. 2.  Support apparatus, as above.  There is a 2.2 x 1.7 cm intermediate attenuation collection of material, favored to represent a resolving mediastinal hematoma, possibly related to recent catheter placement. 3.  Mild interstitial pulmonary edema. Trace right pleural effusion layering dependently. 4.  4 mm nodule in the posterior aspect of the left lower lobe (image 44 of series 3).  This is nonspecific, but may warrant attention on follow-up studies. If the patient is  at high risk for bronchogenic carcinoma, follow-up chest CT at 1 year is recommended.  If the patient is at low risk, no follow-up is needed.  This recommendation follows the consensus statement: Guidelines for Management of Small Pulmonary Nodules Detected on CT Scans:  A Statement from the Fleischner Society as published in Radiology 2005; 237:395-400.  CT ABDOMEN AND PELVIS  Findings:  Abdomen/Pelvis: Subcentimeter low attenuation lesion in segment 4A of the liver is too small to definitively characterize, but is unchanged compared to the prior examination from 07/16/2009, and therefore favored to represent a tiny cyst.  No other hepatic lesions are noted.  The appearance of the gallbladder, pancreas, spleen, bilateral adrenal glands and bilateral kidneys is unremarkable.  No significant volume of ascites.  No pneumoperitoneum.  No pathologic distension of small bowel.  No definite pathologic lymphadenopathy identified within the abdomen or pelvis.  Musculoskeletal: There are no aggressive appearing lytic or blastic lesions noted in the visualized portions  of the skeleton.  IMPRESSION:  1.  No unexpected findings in the abdomen or pelvis that should impede LVAD placement.   Original Report Authenticated By: Trudie Reed, M.D.    Ct Abdomen Pelvis W Contrast  05/31/2012  *RADIOLOGY REPORT*  Clinical Data:  Heart failure.  Preoperative study for possible left ventricular assist device (LVAD) placement.  CT CHEST, ABDOMEN AND PELVIS WITH CONTRAST  Technique:  Multidetector CT imaging of the chest, abdomen and pelvis was performed following the standard protocol during bolus administration of intravenous contrast.  Contrast: OMNIPAQUE IOHEXOL 300 MG/ML  SOLN  Comparison:  Chest CT 07/16/2009.  CT CHEST  Findings:  Mediastinum: The heart is markedly enlarged with severe left ventricular dilatation.  Myocardium appears relatively uniform in thickness without definite areas of thinning and remodelling to suggest scar from prior ischemic event.  Additionally, no significant atherosclerotic calcifications are noted within the thoracic aorta or the coronary arteries.  There is a Swan-Ganz catheter and positioned with tip extending into the right middle lobe pulmonary arteries.  Additionally, a pacemaker/AICD is present with lead tips terminating in the right atrial appendage and right ventricular apex. There is no significant pericardial fluid, thickening or pericardial calcification. No pathologically enlarged mediastinal or hilar lymph nodes. Esophagus is unremarkable in appearance.  Posterolateral to the proximal trachea on the right side there is a 2.2 x 1.7 cm intermediate attenuation (28 HU) collection of material, favored to represent a small resolving hematoma.  Numerous borderline enlarged mediastinal lymph nodes are noted, measuring up to 9 mm in short axis (these are nonspecific).  Lungs/Pleura: Mild diffuse ground glass attenuation and interlobular septal thickening in the lungs bilaterally is indicative of mild interstitial pulmonary edema.  4 mm left  lower lobe nodule (image 44 of series 3).  No other larger more suspicious appearing pulmonary nodules or masses are otherwise noted. Trace right pleural effusion layering dependently.  Musculoskeletal: There are no aggressive appearing lytic or blastic lesions noted in the visualized portions of the skeleton.  IMPRESSION:  1.  Cardiomegaly with severe left ventricular dilatation.  Given the absence of coronary artery calcification and lack of evidence of ventricular scarring or remodelling, this is favored to reflect a nonischemic cardiomyopathy. 2.  Support apparatus, as above.  There is a 2.2 x 1.7 cm intermediate attenuation collection of material, favored to represent a resolving mediastinal hematoma, possibly related to recent catheter placement. 3.  Mild interstitial pulmonary edema. Trace right pleural effusion layering dependently. 4.  4 mm nodule in the  posterior aspect of the left lower lobe (image 44 of series 3).  This is nonspecific, but may warrant attention on follow-up studies. If the patient is at high risk for bronchogenic carcinoma, follow-up chest CT at 1 year is recommended.  If the patient is at low risk, no follow-up is needed.  This recommendation follows the consensus statement: Guidelines for Management of Small Pulmonary Nodules Detected on CT Scans:  A Statement from the Fleischner Society as published in Radiology 2005; 237:395-400.  CT ABDOMEN AND PELVIS  Findings:  Abdomen/Pelvis: Subcentimeter low attenuation lesion in segment 4A of the liver is too small to definitively characterize, but is unchanged compared to the prior examination from 07/16/2009, and therefore favored to represent a tiny cyst.  No other hepatic lesions are noted.  The appearance of the gallbladder, pancreas, spleen, bilateral adrenal glands and bilateral kidneys is unremarkable.  No significant volume of ascites.  No pneumoperitoneum.  No pathologic distension of small bowel.  No definite pathologic  lymphadenopathy identified within the abdomen or pelvis.  Musculoskeletal: There are no aggressive appearing lytic or blastic lesions noted in the visualized portions of the skeleton.  IMPRESSION:  1.  No unexpected findings in the abdomen or pelvis that should impede LVAD placement.   Original Report Authenticated By: Trudie Reed, M.D.      Medications:     Scheduled Medications: . aspirin EC  81 mg Oral Daily  . digoxin  0.125 mg Oral Daily  . sodium chloride  3 mL Intravenous Q12H    Infusions: . sodium chloride 10 mL/hr at 05/31/12 2036  . sodium chloride 10 mL/hr at 05/31/12 2033  . sodium chloride 10 mL/hr at 05/31/12 2039  . heparin 1,000 Units/hr (06/01/12 0802)    PRN Medications: sodium chloride, acetaminophen, magnesium hydroxide, ondansetron (ZOFRAN) IV, oxyCODONE-acetaminophen, sodium chloride, zolpidem   Assessment:   1. A/C systolic HF - Class IV - on home milrinone  2. Cardiogenic shock  3. NICM EF 15-20% with severe MR  4. H/o HTN  5. Hypokalemia  6. Blood type O  7. IABP  Plan/Discussion:    Patient stable overnight and reports feeling better with balloon pump in place. Will leave 1:1 today and goal is to bridge VAD tomorrow if insurance approves. Renal function remains good. Will get ECHO today.   Length of Stay: 2 [name] 06/01/2012, 8:44 AM  Patient seen and examined with Robert Potash, NP. We discussed all aspects of the encounter. I agree with the assessment and plan as stated above.   Much improved with IABP. Will pull swan today and plan on LVAD tomorrow. Given Ernestine Conrad numbers will give 1 dose IV lasix and start low dose milrinone as tolerated.   Robert Varma,MD 9:01 AM

## 2012-06-01 NOTE — Progress Notes (Signed)
Well known to me for severe nonischemic CMP. Courtesy visit. Outstanding response to IABP mechanical support. LVAD in AM, likely to obtain similar/greater benefit. Will defer CHF management to Dr Bensimhon's excellent care. Will drop in to see him and talk to family periodically.

## 2012-06-01 NOTE — Progress Notes (Signed)
Late Entry:  CSW  Met with patient yesterday for initial VAD assessment.  Patient was very responsive to CSW and verbalizes a desire to proceed with VAD placement. During the assessment- patient was informed that he would require IABP placement which was completed yesterday afternoon.  Plan is to proceed with VAD on Friday. CSW will need to complete assessment with patient today.  CSW met patient's parents yesterday afternoon and we have scheduled a patient/family meeting for today at 1 pm.  Patient's sister will be primary caregiver; his parents are secondary.  He also desires to complete a HCPOA and Living Will. CSW will facilitate this afternoon.  Patient presents with a very positive attitude and wants to proceed with VAD. Current concerns of finances, disability and insurance issues will require further assessment and follow up.  CSW will discuss with Alli- VAD coordinator.  [name]. [name]  [phone]

## 2012-06-01 NOTE — Consult Note (Signed)
Anesthesiology:  57 year male with severe dilated, nonischemic cardiomyopathy for Heartmate II LVAD insertion in AM. Pt has EF 10-15% with severe MR by ECHO 5/13, normal coronaries by cath in 2011, V.Tach with  Medtronic AICD placed 4/11.   He was admitted 4/26 with worsening HF symptoms, IABP inserted yesterday. Now feels better  BUN/Cr 18/1.18  Na 135 K 4.2 Albumin 3.1 LFTs normal  Anesthetic plan discussed questions answered.  Kipp Brood, MD

## 2012-06-01 NOTE — Progress Notes (Signed)
ANTICOAGULATION CONSULT NOTE - Pharmacy Consult for heparin Indication: IABP   No Known Allergies  Patient Measurements: Height: 6\' 2"  (188 cm) Weight: 204 lb 2.3 oz (92.6 kg) IBW/kg (Calculated) : 82.2 Heparin Dosing Weight: 92.6 kg  Vital Signs: Temp: 98.6 F (37 C) (02/27 0026) Temp src: Core (Comment) (02/26 1701) BP: 109/81 mmHg (02/27 0100) Pulse Rate: 47 (02/27 0026)  Labs:  Recent Labs  05/30/12 1218 05/30/12 2126 05/31/12 0440 06/01/12  HGB 14.9 13.5  --   --   HCT 42.5 39.1  --   --   PLT 173 158  --   --   LABPROT 13.6  --   --   --   INR 1.05  --   --   --   HEPARINUNFRC  --   --   --  0.20*  CREATININE 1.25 1.05 1.00  --     Estimated Creatinine Clearance: 95.9 ml/min (by C-G formula based on Cr of 1).  Assessment: 57 yo Male with CHF exacerbation,  IABP while awaiting LVA, for heparin.   Goal of Therapy:  Heparin level goal 0.2-0.5 Monitor platelets by anticoagulation protocol: Yes   Plan:  Continue Heparin at current rate  Follow-up am labs.  Geannie Risen, PharmD, BCPS  06/01/2012 1:34 AM

## 2012-06-01 NOTE — Progress Notes (Signed)
ANTICOAGULATION CONSULT NOTE  Pharmacy Consult for heparin Indication: IABP   No Known Allergies  Patient Measurements: Height: 6\' 2"  (188 cm) Weight: 212 lb 11.9 oz (96.5 kg) IBW/kg (Calculated) : 82.2 Heparin Dosing Weight: 92.6 kg  Vital Signs: Temp: 98.1 F (36.7 C) (02/27 1600) Temp src: Oral (02/27 1600) BP: 113/73 mmHg (02/27 2100) Pulse Rate: 101 (02/27 2000)  Labs:  Recent Labs  05/30/12 1218 05/30/12 2126 05/31/12 0440  06/01/12 0500 06/01/12 1050 06/01/12 1400 06/01/12 2100  HGB 14.9 13.5  --   --  11.9*  --   --   --   HCT 42.5 39.1  --   --  34.5*  --   --   --   PLT 173 158  --   --  110*  --   --   --   APTT  --   --   --   --   --   --   --  65*  LABPROT 13.6  --   --   --   --   --   --   --   INR 1.05  --   --   --   --   --   --   --   HEPARINUNFRC  --   --   --   < > 0.17*  --  0.14* 0.18*  CREATININE 1.25 1.05 1.00  --  1.13 1.19  --   --   < > = values in this interval not displayed.  Estimated Creatinine Clearance: 80.6 ml/min (by C-G formula based on Cr of 1.19).   Medical History: Past Medical History  Diagnosis Date  . AICD (automatic cardioverter/defibrillator) present   . Hypertension   . Congestive heart failure   . Pneumonia   . Bronchitis     Medications:  Prescriptions prior to admission  Medication Sig Dispense Refill  . aspirin 81 MG chewable tablet Chew 81 mg by mouth daily.      . digoxin (LANOXIN) 0.125 MG tablet Take 0.125 mg by mouth daily.      Marland Kitchen eplerenone (INSPRA) 50 MG tablet Take 0.5 tablets (25 mg total) by mouth daily.  15 tablet  3  . metoprolol succinate (TOPROL-XL) 25 MG 24 hr tablet Take 1 tablet (25 mg total) by mouth daily.  30 tablet  3  . sodium chloride 0.9 % SOLN with milrinone 1 MG/ML SOLN 200 mcg/mL Inject 0.25 mcg/kg/min into the vein continuous.      . torsemide (DEMADEX) 20 MG tablet Take 1 tablet (20 mg total) by mouth 2 (two) times daily.  60 tablet  3    Assessment: 57 yo M with HTN and  advanced CHF 2/2 non-ischemic cardiomyopathy (EF 15-20% with severe MR) s/p dual chamber ICD.  Admitted for HF exacerbation,  IABP placed with plans for LVAD placement tomorrow.  INR WNL at baseline. Heparin level this evening almost at goal range after increase in rate earlier today.  No complications noted.  Goal of Therapy:  Heparin level goal 0.2-0.5 Monitor platelets by anticoagulation protocol: Yes   Plan:  - Increase heparin to a rate of 1350 units/hr   - Recheck heparin level in 6h hours - Check daily heparin level and CBC - Heparin to be turned off on call to OR 2/28  Thank you for the consult. Jupiter Kabir L. Illene Bolus, PharmD, BCPS Clinical Pharmacist Pager: [phone] Pharmacy: [phone] 06/01/2012 9:42 PM

## 2012-06-01 NOTE — Consult Note (Signed)
Patient Robert [name]      DOB: July 06, 1955      WUJ:[id number]     Consult Note from the Palliative Medicine Team at [facility]    Consult Requested by: Dr Gala Romney     PCP: Default, Provider, MD Reason for Consultation: LVAD Preparedness Plan    Phone [phone]    This NP Lorinda Creed reviewed medical records, received report from team, assessed the patient and then meet at the bedside with his family to include his son/HPOA Frederik Schmidt, sister and main LVAD support for Juanna Cao # [id number], his parents Tamsen Snider and Slaton Reaser and nephew Rochelle Nephew to discuss advanced directives and a preparedness plan in light of scheduled LVAD implantation tomorrow morning.  This is a bridge to transplantation therapy.  A detailed discussion was had today regarding the concept of a preparedness plan as it relates to LVAD therpay with intention of bridge to transplantation.  Patient is currently on transplant list at [facility].   Patient and family were comfortable talking about the "what ifs  and the importance of today's conversation so everyone can have all the information to be full participants and to understand the patient's basic beliefs and wishes as it relates  to healthcare.  Concepts specific to future possibilities of -long term ventilation -artificial feeding and hydration -long term antibiotic use -dialysis --psychological adjustments -need to terminate the pump- -other chronic or terminal disease unrelated to cardiac LVAD  Patient was able to verbalize to his family the importance of quality of life.  He is hopeful  That LVAD procedure will increase his qulaity of life, and quantity to be eligible for tranplantation.  He understands the risks and benefits o fthe procedure as it relates to his future.  At this time patient is open to all available medical interventions to prolong life and the success of the LVAD therapy. He shared and verbalized an understanding that  he and his family would know when the burdens of treatment would outweigh the benefits and trust in his family's support at that time.  A MOST form was completed and placed in the chart.  Patient and family were encouraged to continue conversation into the future as it is vital for the patient centered care.  Chaplain services offered and refused at this time.  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 AICD (automatic cardioverter/defibrillator) present   . Hypertension   . Congestive heart failure   . Pneumonia   . Bronchitis      PSH: Past Surgical History  Procedure Laterality Date  . Cardiac defibrillator placement    . Cardiac catheterization    . Colonoscopy  12/29/2011    Procedure: COLONOSCOPY;  Surgeon: Iva Boop, MD;  Location: [facility] ENDOSCOPY;  Service: Endoscopy;  Laterality: N/A;   I have reviewed the FH and SH and  If appropriate update it with new information. No Known Allergies Scheduled Meds: . [START ON 06/02/2012] aminocaproic  acid (AMICAR) for OHS   Intravenous To OR  . aspirin EC  81 mg Oral Daily  . [START ON 06/02/2012] cefUROXime (ZINACEF)  IV  1.5 g Intravenous To OR  . [START ON 06/02/2012] cefUROXime (ZINACEF)  IV  750 mg Intravenous To OR  . [START ON 06/02/2012] dexmedetomidine  0.1-0.7 mcg/kg/hr Intravenous To OR  . digoxin  0.125 mg Oral Daily  . [START ON 06/02/2012] DOBUTamine  2.5-20 mcg/kg/min Intravenous  Once  . [START ON 06/02/2012] DOPamine  0-10 mcg/kg/min Intravenous To OR  . [START ON 06/02/2012] epinephrine  0-10 mcg/min Intravenous To OR  . [START ON 06/02/2012] fluconazole (DIFLUCAN) IV  400 mg Intravenous To OR  . furosemide      . [START ON 06/02/2012] insulin (NOVOLIN-R) infusion   Intravenous To OR  . [START ON 06/02/2012] magnesium sulfate  40 mEq Other To OR  . [START ON 06/02/2012] milrinone  0.25 mcg/kg/min Intravenous Once  . [START ON 06/02/2012] nitroGLYCERIN  0-100 mcg/min Intravenous To OR  . [START ON 06/02/2012] norepinephrine (LEVOPHED) Adult infusion  2-50 mcg/min Intravenous Once  . perflutren lipid microspheres (DEFINITY) IV suspension      . [START ON 06/02/2012] phenylephrine (NEO-SYNEPHRINE) Adult infusion  0-100 mcg/min Intravenous To OR  . [START ON 06/02/2012] potassium chloride  80 mEq Other To OR  . sodium chloride  3 mL Intravenous Q12H  . [START ON 06/02/2012] vancomycin  1,500 mg Intravenous To OR  . [START ON 06/02/2012] vancomycin  1,000 mg Other To OR  . [START ON 06/02/2012] vasopressin (PITRESSIN) infusion - *FOR SHOCK*  0.03 Units/min Intravenous Once   Continuous Infusions: . sodium chloride 10 mL/hr at 05/31/12 2036  . sodium chloride 10 mL/hr at 05/31/12 2033  . sodium chloride 10 mL/hr at 05/31/12 2039  . heparin 1,200 Units/hr (06/01/12 1516)  . milrinone 0.125 mcg/kg/min (06/01/12 1025)   PRN Meds:.sodium chloride, acetaminophen, magnesium hydroxide, ondansetron (ZOFRAN) IV, oxyCODONE-acetaminophen, sodium chloride, zolpidem    BP 93/62  Pulse 94  Temp(Src) 98.4 F (36.9 C) (Core (Comment))  Resp 21  Ht 6\' 2"  (1.88 m)  Wt 96.5 kg (212 lb 11.9 oz)  BMI 27.3 kg/m2  SpO2 99%   PPS: 30%   Intake/Output Summary (Last 24 hours) at 06/01/12 1527 Last data filed at 06/01/12 1400  Gross per 24 hour  Intake 1607.18 ml  Output   1811 ml  Net -203.82 ml     Physical Exam:  General: NAD, fatigued on minimal exertion HEENT:  moist buccal  membranes Chest:  CTA CVS: RRR + murmur noted Abdomen: soft NT +BS Ext: without edema Neuro: alert and oriented with full capacity  Labs: CBC    Component Value Date/Time   WBC 7.6 06/01/2012 0500   RBC 3.99* 06/01/2012 0500   HGB 11.9* 06/01/2012 0500   HCT 34.5* 06/01/2012 0500   PLT 110* 06/01/2012 0500   MCV 86.5 06/01/2012 0500   MCH 29.8 06/01/2012 0500   MCHC 34.5 06/01/2012 0500   RDW 13.2 06/01/2012 0500   LYMPHSABS 0.8 05/12/2012 1630   MONOABS 0.8 05/12/2012 1630   EOSABS 0.0 05/12/2012 1630   BASOSABS 0.0 05/12/2012 1630    BMET    Component Value Date/Time   NA 135 06/01/2012 1050   K 4.2 06/01/2012 1050   CL 101 06/01/2012 1050   CO2 25 06/01/2012 1050   GLUCOSE 111* 06/01/2012 1050   BUN 18 06/01/2012 1050   CREATININE 1.19 06/01/2012 1050   CALCIUM 8.8 06/01/2012  1050   GFRNONAA 67* 06/01/2012 1050   GFRAA 77* 06/01/2012 1050    CMP     Component Value Date/Time   NA 135 06/01/2012 1050   K 4.2 06/01/2012 1050   CL 101 06/01/2012 1050   CO2 25 06/01/2012 1050   GLUCOSE 111* 06/01/2012 1050   BUN 18 06/01/2012 1050   CREATININE 1.19 06/01/2012 1050   CALCIUM 8.8 06/01/2012 1050   PROT 5.9* 06/01/2012 1050   ALBUMIN 3.1* 06/01/2012 1050   AST 12 06/01/2012 1050   ALT 11 06/01/2012 1050   ALKPHOS 56 06/01/2012 1050   BILITOT 1.2 06/01/2012 1050   GFRNONAA 67* 06/01/2012 1050   GFRAA 77* 06/01/2012 1050      Time In Time Out Total Time Spent with Patient Total Overall Time  1400 1515 70 min 75 min    Greater than 50%  of this time was spent counseling and coordinating care related to the above assessment and plan.     Lorinda Creed NP  Palliative Medicine Team Team Phone # [phone] Pager [phone]

## 2012-06-01 NOTE — Progress Notes (Signed)
ANTICOAGULATION CONSULT NOTE - Initial Consult  Pharmacy Consult for heparin Indication: IABP   No Known Allergies  Patient Measurements: Height: 6\' 2"  (188 cm) Weight: 212 lb 11.9 oz (96.5 kg) IBW/kg (Calculated) : 82.2 Heparin Dosing Weight: 92.6 kg  Vital Signs: Temp: 98.1 F (36.7 C) (02/27 0639) BP: 108/76 mmHg (02/27 0700) Pulse Rate: 102 (02/27 0639)  Labs:  Recent Labs  05/30/12 1218 05/30/12 2126 05/31/12 0440 06/01/12 06/01/12 0500  HGB 14.9 13.5  --   --  11.9*  HCT 42.5 39.1  --   --  34.5*  PLT 173 158  --   --  110*  LABPROT 13.6  --   --   --   --   INR 1.05  --   --   --   --   HEPARINUNFRC  --   --   --  0.20* 0.17*  CREATININE 1.25 1.05 1.00  --  1.13    Estimated Creatinine Clearance: 84.9 ml/min (by C-G formula based on Cr of 1.13).   Medical History: Past Medical History  Diagnosis Date  . AICD (automatic cardioverter/defibrillator) present   . Hypertension   . Congestive heart failure   . Pneumonia   . Bronchitis     Medications:  Prescriptions prior to admission  Medication Sig Dispense Refill  . aspirin 81 MG chewable tablet Chew 81 mg by mouth daily.      . digoxin (LANOXIN) 0.125 MG tablet Take 0.125 mg by mouth daily.      Marland Kitchen eplerenone (INSPRA) 50 MG tablet Take 0.5 tablets (25 mg total) by mouth daily.  15 tablet  3  . metoprolol succinate (TOPROL-XL) 25 MG 24 hr tablet Take 1 tablet (25 mg total) by mouth daily.  30 tablet  3  . sodium chloride 0.9 % SOLN with milrinone 1 MG/ML SOLN 200 mcg/mL Inject 0.25 mcg/kg/min into the vein continuous.      . torsemide (DEMADEX) 20 MG tablet Take 1 tablet (20 mg total) by mouth 2 (two) times daily.  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  Admitted for HF exacerbation,  IABP placed with tentative plans for LVAD placement tomorrow.  INR WNL at baseline. Heparin level this AM below goal  range at 0.17. Hgb has decreased to 11.9, plts down to 110. No infusion line or bleeding issues per RN.  Goal of Therapy:  Heparin level goal 0.2-0.5 Monitor platelets by anticoagulation protocol: Yes   Plan:  - Increase heparin to a rate of 1000 units/hr (~11 units/kg/hr) -- NO BOLUS - Check heparin level in 6h hours - Check daily heparin level and CBC  Thank you for the consult.  Tomi Bamberger, PharmD Clinical Pharmacist Pager: [phone] Pharmacy: [phone] 06/01/2012 8:06 AM

## 2012-06-01 NOTE — Progress Notes (Addendum)
[name] has been discussed with the VAD Medical Review board on 2/24 and 06/01/12. The team feels as if the patient is a good candidate for bridge to transplant LVAD therapy. The patient meets criteria for a LVAD implant as listed below:  1)  Have failed to respond to optimal medical management (including beta-blockers, and ACE inhibitors if tolerated) for at least 45 of the last 60 days, or has been balloon dependent for 7 days, or IV inotrope dependent for 14 days; and,       On Inotropes _Yes_ started: 05/07/12      IABP inserted on 05/31/12 due to progressive decompensation on inotropes (INTERMACS-II)  2)  Have a left ventricular ejection fraction (LVEF) < 25%; and, have demonstrated functional limitation with a peak oxygen consumption of <14 ml/kg/min unless balloon pump or inotrope dependent or physically unable to perform the test.         EF _15_%  By echo (date) 06/01/12            CPX (date)  pVO2: 14.7 ml/kg/min  RER: 1.11       VE/VCo2 slope: 48.1  3)  Social work and palliative care evaluations demonstrate appropriate support system in place for discharge to home with a VAD and that end of life discussions have taken place.         Social work consult (date): 05/31/12        Palliative Care Consult (date): 06/01/12  4)  Primary caretaker identified that can be taught along with the patient how to manage        the VAD equipment.       Name:  Robert [name]   (Sister)  5)  Deemed appropriate by our Presenter, broadcasting.        Prior approval: 06/01/12   6)  VAD Coordinator has met with patient and caregiver and shown them the actual VAD       equipment. Along with showing them the equipment the VAD Coordinator has                 discussed with the patient and caregiver about lifestyle changes and the agreement form was signed.        Met with Ulla Potash (VAD Coordinator) multiple times, agreement form signed 05/22/12  7)  Dr. Lovett Sox has discussed with Dr.  Romona Curls @[facility]  about the patient and he felt to be an appropriate candidate. Dr. Gala Romney has also been in close contact with Dr. Allena Katz @ [facility]. The patient is currently listed on the transplant list as a 1B.   I have discussed Left Ventricular Assist Device (LVAD) implantation as a potential treatment option for end stage heart failure with the patient and his mother, father, son, and sister. I reviewed the risks and benefits of the Heartmate II device and went in depth with them about the responsibilities of the caregiver, lifestyle changes, follow up care, discharge planning, rehabilitation, LVAD dressing supplies, and LVAD driveline management care.   Daniel Bensimhon,MD 5:20 PM

## 2012-06-01 NOTE — Consult Note (Signed)
TCTS VAD Consult Note                     [address]            [place] [address]          [phone]       Robert [name] [facility] Medical Record #[id number] Date of Birth: [birth year]  No ref. provider found Default, Provider, MD  Chief Complaint:   No chief complaint on file.   History of Present Illness:     [name] is a 57 year old Afro-American male with Nonischemic dilated cardiomyopathy first evaluated in mid 2013 with  ejection fraction 20% and advanced heart failure. He was maintained on outpatient meds and followed carefully in the heart failure clinic through the remainder of 2013. In January 2014 CTX study demonstrated peak ejection fraction of 1449% of predicted with a steep V. CO2 slope. His ejection fraction by echo was 15-20% with severe mitral regurgitation. He was evaluated at [facility] and found to be an acceptable transplant candidate and was placed on home milrinone and made status 1B on the [facility] transplant list. He was readmitted to this hospital briefly earlier this month with increasing failure symptoms but improved clinically with adjustment of his beta blocker dose. He is maintained on home milrinone that despite this continued to have advanced heart failure symptoms with decreased exercise tolerance, shortness of breath, abdominal discomfort and he was readmitted after a right heart cath showed low cardiac output with elevated PA pressures. He continued to deteriorate and had a balloon pump placed yesterday for acute on chronic systolic heart failure. A balloon pump his symptoms improved and his mixed venous saturation is increased from 45-70%. He is maintained good urine output, good renal hepatic pulmonary and neurologic function.  I discussed the patient's clinical status with Dr. Romona Curls at  [facility] who confirms the patient is on the active transplant list at [facility]. Dr. Romona Curls agrees that the  patient should undergo implantable VAD placement as a bridge to transplantation due to severe progressive heart failure unresponsive to other measures.   Right heart cath 05-30-12 on moderate dose milrinone  PA pressure 41/21 wedge 25 with P wave 45 Cardiac index 1.9 PA sat 64%  Mixed venous saturation2-26 dropped to 46% and balloon pump placed Current Activity/ Functional Status: Zubrod Score: At the time of surgery this patient's most appropriate activity status/level should be described as: []  Normal activity, no symptoms []  Symptoms, fully ambulatory []  Symptoms, in bed less than or equal to 50% of the time []  Symptoms, in bed greater than 50% of the time but less than 100% [x]  Bedridden with intra-aortic pump and inotropic support []  Moribund   Past Medical History  Diagnosis Date  . AICD (automatic cardioverter/defibrillator) present   . Hypertension   . Congestive heart failure   . Pneumonia   . Bronchitis     Past Surgical History  Procedure Laterality Date  . Cardiac defibrillator placement    . Cardiac catheterization    . Colonoscopy  12/29/2011    Procedure: COLONOSCOPY;  Surgeon: Iva Boop, MD;  Location: [facility] ENDOSCOPY;  Service: Endoscopy;  Laterality: N/A;    History  Smoking status  . Never Smoker   Smokeless tobacco  . Never Used    History  Alcohol Use No    family history no history of cardiomyopathy premature CAD History   Social History  .  Marital Status: Single    Spouse Name: N/A    Number of Children: 2  . Years of Education: N/A   Occupational History  . Disabled    Social History Main Topics  . Smoking status: Never Smoker   . Smokeless tobacco: Never Used  . Alcohol Use: No  . Drug Use: No  . Sexually Active: Not on file   Other Topics Concern  . Not on file   Social History Narrative  . No narrative on file    No Known Allergies  Current Facility-Administered Medications  Medication Dose Route Frequency Provider  Last Rate Last Dose  . 0.9 %  sodium chloride infusion  250 mL Intravenous PRN [name], NP 20 mL/hr at 05/31/12 0800 250 mL at 05/31/12 0800  . 0.9 %  sodium chloride infusion   Intravenous Continuous Dolores Patty, MD 10 mL/hr at 05/31/12 2036    . 0.9 %  sodium chloride infusion   Intravenous Continuous Dolores Patty, MD 10 mL/hr at 05/31/12 2033    . 0.9 %  sodium chloride infusion   Intravenous Continuous Dolores Patty, MD 10 mL/hr at 05/31/12 2039    . acetaminophen (TYLENOL) tablet 650 mg  650 mg Oral Q4H PRN [name], NP      . aspirin EC tablet 81 mg  81 mg Oral Daily [name], NP   81 mg at 05/31/12 0954  . digoxin (LANOXIN) tablet 0.125 mg  0.125 mg Oral Daily [name], NP   0.125 mg at 05/31/12 1757  . furosemide (LASIX) injection 20 mg  20 mg Intravenous Once [name], NP      . heparin ADULT infusion 100 units/mL (25000 units/250 mL)  1,000 Units/hr Intravenous Continuous Laurence Slate, PHARMD 10 mL/hr at 06/01/12 0802 1,000 Units/hr at 06/01/12 0802  . magnesium hydroxide (MILK OF MAGNESIA) suspension 30 mL  30 mL Oral Daily PRN Dolores Patty, MD   30 mL at 05/31/12 2311  . milrinone (PRIMACOR) infusion 200 mcg/mL (0.2 mg/ml)  0.125 mcg/kg/min Intravenous Continuous [name], NP      . ondansetron [facility]) injection 4 mg  4 mg Intravenous Q6H PRN [name], NP   4 mg at 05/31/12 2212  . oxyCODONE-acetaminophen (PERCOCET/ROXICET) 5-325 MG per tablet 1-2 tablet  1-2 tablet Oral Q4H PRN Dolores Patty, MD   1 tablet at 05/31/12 0802  . potassium chloride SA (K-DUR,KLOR-CON) CR tablet 40 mEq  40 mEq Oral Once [name], NP      . sodium chloride 0.9 % injection 3 mL  3 mL Intravenous Q12H [name], NP   3 mL at 05/31/12 2214  . sodium chloride 0.9 % injection 3 mL  3 mL Intravenous PRN [name], NP      . zolpidem (AMBIEN) tablet 5 mg  5 mg Oral QHS PRN Gery Pray, PA-C   5 mg at 05/31/12 2311      History reviewed. No pertinent family history.   Review of Systems:     Cardiac Review of Systems: Y or N  Chest Pain [    ]  Resting SOB [ Y.  ] Exertional SOB  [  Y.]  Orthopnea [  ]   Pedal Edema [ N.  ]    Palpitations [  ] Syncope  Correy.Dess   ]   Presyncope [  N. ]  General Review of Systems: [Y] =  yes [  ]=no Constitional: recent weight change [  ]; anorexia [  ]; fatigue [  ]; nausea [  ]; night sweats [  ]; fever [  ]; or chills [  ];                                                                                                                                          Dental: poor dentition[  ]; Last Dentist visit1 yr  Eye : blurred vision [  ]; diplopia [   ]; vision changes [  ];  Amaurosis fugax[  ]; Resp: cough [  ];  wheezing[  ];  hemoptysis[  ]; shortness of breath[  ]; paroxysmal nocturnal dyspnea[  ]; dyspnea on exertion[  ]; or orthopnea[  ];  GI:  gallstones[  ], vomiting[  ];  dysphagia[  ]; melena[  ];  hematochezia [  ]; heartburn[  ];   Hx of  Colonoscopy[  ]; GU: kidney stones [  ]; hematuria[  ];   dysuria [  ];  nocturia[  ];  history of     obstruction [  ];                 Skin: rash, swelling[  ];, hair loss[  ];  peripheral edema[  ];  or itching[  ]; Musculosketetal: myalgias[  ];  joint swelling[  ];  joint erythema[  ];  joint pain[  ];  back pain[  ];  Heme/Lymph: bruising[  ];  bleeding[  ];  anemia[  ];  Neuro: TIA[  ];  headaches[  ];  stroke[  ];  vertigo[  ];  seizures[  ];   paresthesias[  ];  difficulty walking[  ];  Psych:depression[  ]; anxiety[  ];  Endocrine: diabetes[  ];  thyroid dysfunction[  ];  Immunizations: Flu [  ]; Pneumococcal[  ];  Other:  Physical Exam: BP 108/87  Pulse 66  Temp(Src) 98.6 F (37 C) (Core (Comment))  Resp 24  Ht 6\' 2"  (1.88 m)  Wt 212 lb 11.9 oz (96.5 kg)  BMI 27.3 kg/m2  SpO2 96%  General appearance middle-aged American male in the CCU and balloon pump work responsive   HEENT pupils equal dentition  good normocephalic Neck no JVD PA catheter in right IJ no carotid bruit Thorax distant breath sounds no deformity or tenderness Cardiac sinus rhythm 3/6 holosystolic murmur of MR, loud S3 gallop Abdomen soft nontender without pulsatile mass Extremities warm without cyanosis tenderness or edema Vascular palpable distal pulses, balloon pump in place right femoral artery Neuro alert oriented no focal motor deficit   Diagnostic Studies & Laboratory data:   Right heart cath, CT scan of chest, laboratory data all reviewed  Recent Radiology Findings:   Ct Chest W Contrast  05/31/2012  *RADIOLOGY REPORT*  Clinical Data:  Heart failure.  Preoperative study for  possible left ventricular assist device (LVAD) placement.  CT CHEST, ABDOMEN AND PELVIS WITH CONTRAST  Technique:  Multidetector CT imaging of the chest, abdomen and pelvis was performed following the standard protocol during bolus administration of intravenous contrast.  Contrast: OMNIPAQUE IOHEXOL 300 MG/ML  SOLN  Comparison:  Chest CT 07/16/2009.  CT CHEST  Findings:  Mediastinum: The heart is markedly enlarged with severe left ventricular dilatation.  Myocardium appears relatively uniform in thickness without definite areas of thinning and remodelling to suggest scar from prior ischemic event.  Additionally, no significant atherosclerotic calcifications are noted within the thoracic aorta or the coronary arteries.  There is a Swan-Ganz catheter and positioned with tip extending into the right middle lobe pulmonary arteries.  Additionally, a pacemaker/AICD is present with lead tips terminating in the right atrial appendage and right ventricular apex. There is no significant pericardial fluid, thickening or pericardial calcification. No pathologically enlarged mediastinal or hilar lymph nodes. Esophagus is unremarkable in appearance.  Posterolateral to the proximal trachea on the right side there is a 2.2 x 1.7 cm intermediate attenuation (28 HU)  collection of material, favored to represent a small resolving hematoma.  Numerous borderline enlarged mediastinal lymph nodes are noted, measuring up to 9 mm in short axis (these are nonspecific).  Lungs/Pleura: Mild diffuse ground glass attenuation and interlobular septal thickening in the lungs bilaterally is indicative of mild interstitial pulmonary edema.  4 mm left lower lobe nodule (image 44 of series 3).  No other larger more suspicious appearing pulmonary nodules or masses are otherwise noted. Trace right pleural effusion layering dependently.  Musculoskeletal: There are no aggressive appearing lytic or blastic lesions noted in the visualized portions of the skeleton.  IMPRESSION:  1.  Cardiomegaly with severe left ventricular dilatation.  Given the absence of coronary artery calcification and lack of evidence of ventricular scarring or remodelling, this is favored to reflect a nonischemic cardiomyopathy. 2.  Support apparatus, as above.  There is a 2.2 x 1.7 cm intermediate attenuation collection of material, favored to represent a resolving mediastinal hematoma, possibly related to recent catheter placement. 3.  Mild interstitial pulmonary edema. Trace right pleural effusion layering dependently. 4.  4 mm nodule in the posterior aspect of the left lower lobe (image 44 of series 3).  This is nonspecific, but may warrant attention on follow-up studies. If the patient is at high risk for bronchogenic carcinoma, follow-up chest CT at 1 year is recommended.  If the patient is at low risk, no follow-up is needed.  This recommendation follows the consensus statement: Guidelines for Management of Small Pulmonary Nodules Detected on CT Scans:  A Statement from the Fleischner Society as published in Radiology 2005; 237:395-400.  CT ABDOMEN AND PELVIS  Findings:  Abdomen/Pelvis: Subcentimeter low attenuation lesion in segment 4A of the liver is too small to definitively characterize, but is unchanged compared to the  prior examination from 07/16/2009, and therefore favored to represent a tiny cyst.  No other hepatic lesions are noted.  The appearance of the gallbladder, pancreas, spleen, bilateral adrenal glands and bilateral kidneys is unremarkable.  No significant volume of ascites.  No pneumoperitoneum.  No pathologic distension of small bowel.  No definite pathologic lymphadenopathy identified within the abdomen or pelvis.  Musculoskeletal: There are no aggressive appearing lytic or blastic lesions noted in the visualized portions of the skeleton.  IMPRESSION:  1.  No unexpected findings in the abdomen or pelvis that should impede LVAD placement.   Original Report Authenticated  By: Trudie Reed, M.D.    Ct Abdomen Pelvis W Contrast  05/31/2012  *RADIOLOGY REPORT*  Clinical Data:  Heart failure.  Preoperative study for possible left ventricular assist device (LVAD) placement.  CT CHEST, ABDOMEN AND PELVIS WITH CONTRAST  Technique:  Multidetector CT imaging of the chest, abdomen and pelvis was performed following the standard protocol during bolus administration of intravenous contrast.  Contrast: OMNIPAQUE IOHEXOL 300 MG/ML  SOLN  Comparison:  Chest CT 07/16/2009.  CT CHEST  Findings:  Mediastinum: The heart is markedly enlarged with severe left ventricular dilatation.  Myocardium appears relatively uniform in thickness without definite areas of thinning and remodelling to suggest scar from prior ischemic event.  Additionally, no significant atherosclerotic calcifications are noted within the thoracic aorta or the coronary arteries.  There is a Swan-Ganz catheter and positioned with tip extending into the right middle lobe pulmonary arteries.  Additionally, a pacemaker/AICD is present with lead tips terminating in the right atrial appendage and right ventricular apex. There is no significant pericardial fluid, thickening or pericardial calcification. No pathologically enlarged mediastinal or hilar lymph nodes.  Esophagus is unremarkable in appearance.  Posterolateral to the proximal trachea on the right side there is a 2.2 x 1.7 cm intermediate attenuation (28 HU) collection of material, favored to represent a small resolving hematoma.  Numerous borderline enlarged mediastinal lymph nodes are noted, measuring up to 9 mm in short axis (these are nonspecific).  Lungs/Pleura: Mild diffuse ground glass attenuation and interlobular septal thickening in the lungs bilaterally is indicative of mild interstitial pulmonary edema.  4 mm left lower lobe nodule (image 44 of series 3).  No other larger more suspicious appearing pulmonary nodules or masses are otherwise noted. Trace right pleural effusion layering dependently.  Musculoskeletal: There are no aggressive appearing lytic or blastic lesions noted in the visualized portions of the skeleton.  IMPRESSION:  1.  Cardiomegaly with severe left ventricular dilatation.  Given the absence of coronary artery calcification and lack of evidence of ventricular scarring or remodelling, this is favored to reflect a nonischemic cardiomyopathy. 2.  Support apparatus, as above.  There is a 2.2 x 1.7 cm intermediate attenuation collection of material, favored to represent a resolving mediastinal hematoma, possibly related to recent catheter placement. 3.  Mild interstitial pulmonary edema. Trace right pleural effusion layering dependently. 4.  4 mm nodule in the posterior aspect of the left lower lobe (image 44 of series 3).  This is nonspecific, but may warrant attention on follow-up studies. If the patient is at high risk for bronchogenic carcinoma, follow-up chest CT at 1 year is recommended.  If the patient is at low risk, no follow-up is needed.  This recommendation follows the consensus statement: Guidelines for Management of Small Pulmonary Nodules Detected on CT Scans:  A Statement from the Fleischner Society as published in Radiology 2005; 237:395-400.  CT ABDOMEN AND PELVIS  Findings:   Abdomen/Pelvis: Subcentimeter low attenuation lesion in segment 4A of the liver is too small to definitively characterize, but is unchanged compared to the prior examination from 07/16/2009, and therefore favored to represent a tiny cyst.  No other hepatic lesions are noted.  The appearance of the gallbladder, pancreas, spleen, bilateral adrenal glands and bilateral kidneys is unremarkable.  No significant volume of ascites.  No pneumoperitoneum.  No pathologic distension of small bowel.  No definite pathologic lymphadenopathy identified within the abdomen or pelvis.  Musculoskeletal: There are no aggressive appearing lytic or blastic lesions noted in the visualized  portions of the skeleton.  IMPRESSION:  1.  No unexpected findings in the abdomen or pelvis that should impede LVAD placement.   Original Report Authenticated By: Trudie Reed, M.D.       Recent Lab Findings: Lab Results  Component Value Date   WBC 7.6 06/01/2012   HGB 11.9* 06/01/2012   HCT 34.5* 06/01/2012   PLT 110* 06/01/2012   GLUCOSE 91 06/01/2012   CHOL 206* 08/06/2011   TRIG 121 08/06/2011   HDL 50 08/06/2011   LDLCALC 161* 08/06/2011   ALT 32 05/12/2012   AST 29 05/12/2012   NA 135 06/01/2012   K 4.1 06/01/2012   CL 102 06/01/2012   CREATININE 1.13 06/01/2012   BUN 17 06/01/2012   CO2 28 06/01/2012   TSH 1.881 08/06/2011   INR 1.05 05/30/2012   HGBA1C 5.8* 08/05/2011      Assessment / Plan:     57 year old Afro-American male with idiopathic dilated cardiomyopathy progressive end-stage heart failure was acute on chronic systolic heart failure ejection fraction 15-20% with severe MR, pulmonary hypertension, low cardiac output. Good renal hepatic pulmonary and neurologic status. Patient will be prepared for placement of implantable VAD is bridge to transplantation tomorrow a.m. Procedure indications benefits and alternatives reviewed with patient and family. The physician's at his heart transplant center-[facility]  with plan.

## 2012-06-01 NOTE — Progress Notes (Signed)
Met with patient and family members this afternoon to complete VAD SW assessment. Family consisted of his parents, sister, eldest son and nephew.  Patient completed his Health Care Power of [employer] Friends- appointing his eldest son Lyda Jester as [place]. His [name] will be his primary caregiver with his parents as secondary. Son and nephew will also be support persons.  The Advanced Directive document was completed and notarized.  Copy placed on chart; original and copies given to patient's family.  Patient was in a very positive mood today- making jokes with CSW and family. Both patient and family state that they are ready and wanting to proceed to VAD placement.  CSW will meet with family tomorrow to provide support and assist as needed.  [name]. [name]  [phone]

## 2012-06-01 NOTE — Progress Notes (Signed)
VASCULAR LAB PRELIMINARY  PRELIMINARY  PRELIMINARY  PRELIMINARY  Bilateral lower extremity venous duplex  completed.    Preliminary report:  Bilateral:  No evidence of DVT, superficial thrombosis, or Baker's Cyst.    Robert [name], RVT 06/01/2012, 10:17 AM

## 2012-06-01 NOTE — Progress Notes (Signed)
Echocardiogram 2D Echocardiogram with Definity has been performed.  Robert [name] 06/01/2012, 12:29 PM

## 2012-06-02 ENCOUNTER — Encounter (HOSPITAL_COMMUNITY): Payer: Self-pay | Admitting: Certified Registered"

## 2012-06-02 ENCOUNTER — Inpatient Hospital Stay (HOSPITAL_COMMUNITY): Payer: BC Managed Care – PPO | Admitting: Certified Registered"

## 2012-06-02 ENCOUNTER — Encounter (HOSPITAL_COMMUNITY)
Admission: RE | Disposition: A | Discharge status: Home / Self Care | Payer: Self-pay | Source: Ambulatory Visit | Attending: Cardiothoracic Surgery

## 2012-06-02 ENCOUNTER — Inpatient Hospital Stay (HOSPITAL_COMMUNITY): Payer: BC Managed Care – PPO

## 2012-06-02 DIAGNOSIS — I428 Other cardiomyopathies: Secondary | ICD-10-CM

## 2012-06-02 DIAGNOSIS — I5023 Acute on chronic systolic (congestive) heart failure: Secondary | ICD-10-CM

## 2012-06-02 HISTORY — PX: INSERTION OF IMPLANTABLE LEFT VENTRICULAR ASSIST DEVICE: SHX5866

## 2012-06-02 HISTORY — PX: INTRAOPERATIVE TRANSESOPHAGEAL ECHOCARDIOGRAM: SHX5062

## 2012-06-02 LAB — POCT I-STAT 4, (NA,K, GLUC, HGB,HCT)
Glucose, Bld: 105 mg/dL — ABNORMAL HIGH (ref 70–99)
Glucose, Bld: 91 mg/dL (ref 70–99)
HCT: 33 % — ABNORMAL LOW (ref 39.0–52.0)
Hemoglobin: 10.5 g/dL — ABNORMAL LOW (ref 13.0–17.0)
Hemoglobin: 11.2 g/dL — ABNORMAL LOW (ref 13.0–17.0)
Hemoglobin: 9.5 g/dL — ABNORMAL LOW (ref 13.0–17.0)
Hemoglobin: 8.5 g/dL — ABNORMAL LOW (ref 13.0–17.0)
Potassium: 4.4 mEq/L (ref 3.5–5.1)
Potassium: 3.5 mEq/L (ref 3.5–5.1)
Sodium: 137 mEq/L (ref 135–145)
Sodium: 137 mEq/L (ref 135–145)
Sodium: 139 mEq/L (ref 135–145)

## 2012-06-02 LAB — BLOOD GAS, ARTERIAL
Acid-base deficit: 0.3 mmol/L (ref 0.0–2.0)
Bicarbonate: 23 mEq/L (ref 20.0–24.0)
Drawn by: 33234
FIO2: 0.21 %
O2 Saturation: 93.6 %
Patient temperature: 98.6
TCO2: 23.9 mmol/L (ref 0–100)
pCO2 arterial: 32 mmHg — ABNORMAL LOW (ref 35.0–45.0)
pH, Arterial: 7.469 — ABNORMAL HIGH (ref 7.350–7.450)
pO2, Arterial: 65.4 mmHg — ABNORMAL LOW (ref 80.0–100.0)

## 2012-06-02 LAB — BASIC METABOLIC PANEL
BUN: 17 mg/dL (ref 6–23)
BUN: 14 mg/dL (ref 6–23)
CO2: 26 mEq/L (ref 19–32)
Chloride: 104 mEq/L (ref 96–112)
Chloride: 107 mEq/L (ref 96–112)
GFR calc Af Amer: 71 mL/min — ABNORMAL LOW (ref >90)
GFR calc non Af Amer: 62 mL/min — ABNORMAL LOW (ref >90)
Glucose, Bld: 102 mg/dL — ABNORMAL HIGH (ref 70–99)
Potassium: 4.4 mEq/L (ref 3.5–5.1)
Potassium: 3.5 mEq/L (ref 3.5–5.1)
Sodium: 137 mEq/L (ref 135–145)

## 2012-06-02 LAB — CARBOXYHEMOGLOBIN
Carboxyhemoglobin: 1.1 % (ref 0.5–1.5)
Carboxyhemoglobin: 1.5 % (ref 0.5–1.5)
Methemoglobin: 0.9 % (ref 0.0–1.5)
Methemoglobin: 1.3 % (ref 0.0–1.5)
O2 Saturation: 53.3 %
Total hemoglobin: 11.1 g/dL — ABNORMAL LOW (ref 13.5–18.0)
Total hemoglobin: 11.7 g/dL — ABNORMAL LOW (ref 13.5–18.0)

## 2012-06-02 LAB — CREATININE, SERUM
Creatinine, Ser: 1.04 mg/dL (ref 0.50–1.35)
GFR calc non Af Amer: 78 mL/min — ABNORMAL LOW (ref >90)

## 2012-06-02 LAB — PROTEIN S, TOTAL: Protein S Ag, Total: 91 % (ref 60–150)

## 2012-06-02 LAB — POCT I-STAT 7, (LYTES, BLD GAS, ICA,H+H)
Acid-Base Excess: 1 mmol/L (ref 0.0–2.0)
Calcium, Ion: 1.09 mmol/L — ABNORMAL LOW (ref 1.12–1.23)
HCT: 27 % — ABNORMAL LOW (ref 39.0–52.0)
O2 Saturation: 100 %
Potassium: 3.4 mEq/L — ABNORMAL LOW (ref 3.5–5.1)
Sodium: 141 mEq/L (ref 135–145)
pO2, Arterial: 281 mmHg — ABNORMAL HIGH (ref 80.0–100.0)

## 2012-06-02 LAB — FACTOR 5 LEIDEN

## 2012-06-02 LAB — CBC
HCT: 27.2 % — ABNORMAL LOW (ref 39.0–52.0)
HCT: 24.4 % — ABNORMAL LOW (ref 39.0–52.0)
Hemoglobin: 8.5 g/dL — ABNORMAL LOW (ref 13.0–17.0)
MCH: 29.8 pg (ref 26.0–34.0)
MCH: 29.7 pg (ref 26.0–34.0)
MCHC: 34.5 g/dL (ref 30.0–36.0)
MCHC: 34.8 g/dL (ref 30.0–36.0)
MCV: 86.3 fL (ref 78.0–100.0)
MCV: 85.3 fL (ref 78.0–100.0)
MCV: 85.3 fL (ref 78.0–100.0)
Platelets: 96 10*3/uL — ABNORMAL LOW (ref 150–400)
Platelets: 105 10*3/uL — ABNORMAL LOW (ref 150–400)
RBC: 3.86 MIL/uL — ABNORMAL LOW (ref 4.22–5.81)
RBC: 3.19 MIL/uL — ABNORMAL LOW (ref 4.22–5.81)
RBC: 2.86 MIL/uL — ABNORMAL LOW (ref 4.22–5.81)
RDW: 13 % (ref 11.5–15.5)
RDW: 12.9 % (ref 11.5–15.5)
WBC: 10.1 10*3/uL (ref 4.0–10.5)
WBC: 8.5 10*3/uL (ref 4.0–10.5)

## 2012-06-02 LAB — DIC (DISSEMINATED INTRAVASCULAR COAGULATION) PANEL
D-Dimer, Quant: 1.42 ug/mL-FEU — ABNORMAL HIGH (ref 0.00–0.48)
aPTT: 39 seconds — ABNORMAL HIGH (ref 24–37)

## 2012-06-02 LAB — POCT I-STAT 3, ART BLOOD GAS (G3+)
Acid-base deficit: 1 mmol/L (ref 0.0–2.0)
Acid-base deficit: 1 mmol/L (ref 0.0–2.0)
Bicarbonate: 24.8 mEq/L — ABNORMAL HIGH (ref 20.0–24.0)
Bicarbonate: 24.5 mEq/L — ABNORMAL HIGH (ref 20.0–24.0)
O2 Saturation: 100 %
O2 Saturation: 99 %
TCO2: 26 mmol/L (ref 0–100)
TCO2: 26 mmol/L (ref 0–100)
pCO2 arterial: 46.6 mmHg — ABNORMAL HIGH (ref 35.0–45.0)
pCO2 arterial: 33.9 mmHg — ABNORMAL LOW (ref 35.0–45.0)
pCO2 arterial: 34 mmHg — ABNORMAL LOW (ref 35.0–45.0)
pH, Arterial: 7.473 — ABNORMAL HIGH (ref 7.350–7.450)
pH, Arterial: 7.491 — ABNORMAL HIGH (ref 7.350–7.450)
pO2, Arterial: 371 mmHg — ABNORMAL HIGH (ref 80.0–100.0)
pO2, Arterial: 104 mmHg — ABNORMAL HIGH (ref 80.0–100.0)
pO2, Arterial: 182 mmHg — ABNORMAL HIGH (ref 80.0–100.0)

## 2012-06-02 LAB — POCT I-STAT, CHEM 8
BUN: 13 mg/dL (ref 6–23)
BUN: 13 mg/dL (ref 6–23)
Chloride: 107 mEq/L (ref 96–112)
Creatinine, Ser: 1 mg/dL (ref 0.50–1.35)
Hemoglobin: 9.5 g/dL — ABNORMAL LOW (ref 13.0–17.0)
Potassium: 3.5 mEq/L (ref 3.5–5.1)
Sodium: 141 mEq/L (ref 135–145)
Sodium: 141 mEq/L (ref 135–145)

## 2012-06-02 LAB — BETA-2-GLYCOPROTEIN I ABS, IGG/M/A
Beta-2 Glyco I IgG: 1 G Units (ref <20)
Beta-2-Glycoprotein I IgA: 6 A Units (ref <20)
Beta-2-Glycoprotein I IgM: 10 M Units (ref <20)

## 2012-06-02 LAB — HEMOGLOBIN AND HEMATOCRIT, BLOOD
HCT: 27.5 % — ABNORMAL LOW (ref 39.0–52.0)
Hemoglobin: 9.5 g/dL — ABNORMAL LOW (ref 13.0–17.0)

## 2012-06-02 LAB — HEMOGLOBIN A1C
Hgb A1c MFr Bld: 5.9 % — ABNORMAL HIGH (ref <5.7)
Mean Plasma Glucose: 123 mg/dL — ABNORMAL HIGH (ref <117)

## 2012-06-02 LAB — CARDIOLIPIN ANTIBODIES, IGG, IGM, IGA
Anticardiolipin IgA: 4 APL U/mL — ABNORMAL LOW (ref <22)
Anticardiolipin IgG: 9 GPL U/mL — ABNORMAL LOW (ref <23)
Anticardiolipin IgM: 4 MPL U/mL — ABNORMAL LOW (ref <11)

## 2012-06-02 LAB — DIC (DISSEMINATED INTRAVASCULAR COAGULATION)PANEL
INR: 1.5 — ABNORMAL HIGH (ref 0.00–1.49)
Prothrombin Time: 17.7 seconds — ABNORMAL HIGH (ref 11.6–15.2)

## 2012-06-02 LAB — PLATELET COUNT: Platelets: 65 10*3/uL — ABNORMAL LOW (ref 150–400)

## 2012-06-02 LAB — PROTEIN C, TOTAL: Protein C, Total: 56 % — ABNORMAL LOW (ref 72–160)

## 2012-06-02 SURGERY — INSERTION OF IMPLANTABLE LEFT VENTRICULAR ASSIST DEVICE
Anesthesia: General | Site: Chest | Wound class: Clean

## 2012-06-02 MED ORDER — OXYCODONE HCL 5 MG PO TABS
5.0000 mg | ORAL_TABLET | ORAL | Status: DC | PRN
  Administered 2012-06-04 (×2): 5 mg via ORAL
  Administered 2012-06-06: 10 mg via ORAL
  Administered 2012-06-06 – 2012-06-07 (×2): 5 mg via ORAL
  Administered 2012-06-07: 10 mg via ORAL
  Administered 2012-06-08 (×2): 5 mg via ORAL
  Filled 2012-06-02 (×4): qty 1
  Filled 2012-06-02: qty 2
  Filled 2012-06-02: qty 1
  Filled 2012-06-02: qty 2
  Filled 2012-06-02: qty 1

## 2012-06-02 MED ORDER — VANCOMYCIN HCL IN DEXTROSE 1-5 GM/200ML-% IV SOLN
1000.0000 mg | Freq: Once | INTRAVENOUS | Status: DC
Start: 2012-06-02 — End: 2012-06-02
  Filled 2012-06-02: qty 200

## 2012-06-02 MED ORDER — SODIUM CHLORIDE 0.9 % IR SOLN
Status: DC | PRN
  Administered 2012-06-02 (×2)

## 2012-06-02 MED ORDER — MIDAZOLAM HCL 2 MG/2ML IJ SOLN
2.0000 mg | INTRAMUSCULAR | Status: DC | PRN
  Administered 2012-06-02 – 2012-06-03 (×7): 2 mg via INTRAVENOUS
  Filled 2012-06-02 (×7): qty 2

## 2012-06-02 MED ORDER — NITROGLYCERIN IN D5W 200-5 MCG/ML-% IV SOLN: 0.0000 ug/min | INTRAVENOUS | Status: DC

## 2012-06-02 MED ORDER — ACETAMINOPHEN 10 MG/ML IV SOLN
1000.0000 mg | Freq: Once | INTRAVENOUS | Status: AC
  Administered 2012-06-02: 1000 mg via INTRAVENOUS
  Filled 2012-06-02: qty 100

## 2012-06-02 MED ORDER — INSULIN ASPART 100 UNIT/ML ~~LOC~~ SOLN
0.0000 [IU] | SUBCUTANEOUS | Status: AC
  Administered 2012-06-02 – 2012-06-03 (×2): 2 [IU] via SUBCUTANEOUS

## 2012-06-02 MED ORDER — SODIUM CHLORIDE 0.9 % IV SOLN
INTRAVENOUS | Status: DC
  Filled 2012-06-02: qty 1

## 2012-06-02 MED ORDER — VECURONIUM BROMIDE 10 MG IV SOLR
INTRAVENOUS | Status: DC | PRN
  Administered 2012-06-02 (×2): 10 mg via INTRAVENOUS

## 2012-06-02 MED ORDER — ASPIRIN EC 325 MG PO TBEC
325.0000 mg | DELAYED_RELEASE_TABLET | Freq: Every day | ORAL | Status: DC
  Administered 2012-06-03 – 2012-06-16 (×14): 325 mg via ORAL
  Filled 2012-06-02 (×14): qty 1

## 2012-06-02 MED ORDER — SODIUM CHLORIDE 0.9 % IJ SOLN
3.0000 mL | Freq: Two times a day (BID) | INTRAMUSCULAR | Status: DC
  Administered 2012-06-03 – 2012-06-06 (×7): 3 mL via INTRAVENOUS

## 2012-06-02 MED ORDER — ACETAMINOPHEN 500 MG PO TABS
1000.0000 mg | ORAL_TABLET | Freq: Four times a day (QID) | ORAL | Status: DC
  Administered 2012-06-03 – 2012-06-05 (×8): 1000 mg via ORAL
  Filled 2012-06-02 (×16): qty 2

## 2012-06-02 MED ORDER — HEMOSTATIC AGENTS (NO CHARGE) OPTIME
TOPICAL | Status: DC | PRN
  Administered 2012-06-02: 1 via TOPICAL

## 2012-06-02 MED ORDER — ETOMIDATE 2 MG/ML IV SOLN
INTRAVENOUS | Status: DC | PRN
  Administered 2012-06-02: 12 mg via INTRAVENOUS

## 2012-06-02 MED ORDER — PHENYLEPHRINE HCL 10 MG/ML IJ SOLN
0.0000 ug/min | INTRAVENOUS | Status: DC
  Filled 2012-06-02: qty 2

## 2012-06-02 MED ORDER — ONDANSETRON HCL 4 MG/2ML IJ SOLN
4.0000 mg | Freq: Four times a day (QID) | INTRAMUSCULAR | Status: DC | PRN
  Administered 2012-06-04 – 2012-06-05 (×4): 4 mg via INTRAVENOUS
  Filled 2012-06-02 (×4): qty 2

## 2012-06-02 MED ORDER — MAGNESIUM SULFATE 40 MG/ML IJ SOLN
4.0000 g | Freq: Once | INTRAMUSCULAR | Status: AC
  Administered 2012-06-02: 4 g via INTRAVENOUS
  Filled 2012-06-02: qty 100

## 2012-06-02 MED ORDER — POTASSIUM CHLORIDE 10 MEQ/50ML IV SOLN
10.0000 meq | INTRAVENOUS | Status: AC
  Administered 2012-06-02 (×3): 10 meq via INTRAVENOUS

## 2012-06-02 MED ORDER — LACTATED RINGERS IV SOLN: 500.0000 mL | Freq: Once | INTRAVENOUS | Status: AC | PRN

## 2012-06-02 MED ORDER — INSULIN REGULAR BOLUS VIA INFUSION
0.0000 [IU] | Freq: Three times a day (TID) | INTRAVENOUS | Status: DC
  Filled 2012-06-02: qty 10

## 2012-06-02 MED ORDER — MIDAZOLAM HCL 5 MG/5ML IJ SOLN
INTRAMUSCULAR | Status: DC | PRN
  Administered 2012-06-02 (×2): 2 mg via INTRAVENOUS
  Administered 2012-06-02: 4 mg via INTRAVENOUS
  Administered 2012-06-02: 2 mg via INTRAVENOUS
  Administered 2012-06-02: 3 mg via INTRAVENOUS
  Administered 2012-06-02: 5 mg via INTRAVENOUS
  Administered 2012-06-02: 2 mg via INTRAVENOUS
  Administered 2012-06-02: 4 mg via INTRAVENOUS
  Administered 2012-06-02 (×2): 2 mg via INTRAVENOUS

## 2012-06-02 MED ORDER — METOCLOPRAMIDE HCL 5 MG/ML IJ SOLN
5.0000 mg | Freq: Four times a day (QID) | INTRAMUSCULAR | Status: AC
  Administered 2012-06-03 (×3): 5 mg via INTRAVENOUS
  Filled 2012-06-02 (×4): qty 1

## 2012-06-02 MED ORDER — LACTATED RINGERS IV SOLN
INTRAVENOUS | Status: DC | PRN
  Administered 2012-06-02: 07:00:00 via INTRAVENOUS

## 2012-06-02 MED ORDER — MORPHINE SULFATE 2 MG/ML IJ SOLN
1.0000 mg | INTRAMUSCULAR | Status: AC | PRN
  Administered 2012-06-02 (×2): 2 mg via INTRAVENOUS
  Administered 2012-06-03: 4 mg via INTRAVENOUS
  Filled 2012-06-02 (×3): qty 2
  Filled 2012-06-02: qty 1

## 2012-06-02 MED ORDER — DOCUSATE SODIUM 100 MG PO CAPS
200.0000 mg | ORAL_CAPSULE | Freq: Every day | ORAL | Status: DC
  Administered 2012-06-04 – 2012-06-16 (×11): 200 mg via ORAL
  Filled 2012-06-02 (×11): qty 2

## 2012-06-02 MED ORDER — MORPHINE SULFATE 2 MG/ML IJ SOLN
2.0000 mg | INTRAMUSCULAR | Status: DC | PRN
  Administered 2012-06-03 (×3): 2 mg via INTRAVENOUS
  Administered 2012-06-03 (×2): 4 mg via INTRAVENOUS
  Administered 2012-06-03 – 2012-06-05 (×9): 2 mg via INTRAVENOUS
  Administered 2012-06-06: 4 mg via INTRAVENOUS
  Filled 2012-06-02 (×2): qty 1
  Filled 2012-06-02: qty 2
  Filled 2012-06-02 (×3): qty 1
  Filled 2012-06-02: qty 2
  Filled 2012-06-02: qty 1
  Filled 2012-06-02: qty 2
  Filled 2012-06-02 (×2): qty 1
  Filled 2012-06-02: qty 2

## 2012-06-02 MED ORDER — INSULIN ASPART 100 UNIT/ML ~~LOC~~ SOLN
0.0000 [IU] | SUBCUTANEOUS | Status: DC
  Administered 2012-06-03: 2 [IU] via SUBCUTANEOUS

## 2012-06-02 MED ORDER — ACETAMINOPHEN 160 MG/5ML PO SOLN
975.0000 mg | Freq: Four times a day (QID) | ORAL | Status: DC
  Administered 2012-06-03: 975 mg
  Filled 2012-06-02: qty 40.6

## 2012-06-02 MED ORDER — ALBUMIN HUMAN 5 % IV SOLN
INTRAVENOUS | Status: DC | PRN
  Administered 2012-06-02 (×2): via INTRAVENOUS

## 2012-06-02 MED ORDER — SODIUM CHLORIDE 0.9 % IV SOLN
10.0000 g | INTRAVENOUS | Status: DC | PRN
  Administered 2012-06-02: 5 g/h via INTRAVENOUS

## 2012-06-02 MED ORDER — BISACODYL 10 MG RE SUPP
10.0000 mg | Freq: Every day | RECTAL | Status: DC
  Administered 2012-06-06: 10 mg via RECTAL
  Filled 2012-06-02: qty 1

## 2012-06-02 MED ORDER — SODIUM CHLORIDE 0.9 % IV SOLN
250.0000 mL | INTRAVENOUS | Status: DC | PRN
  Administered 2012-06-11: 03:00:00 via INTRAVENOUS

## 2012-06-02 MED ORDER — PROTAMINE SULFATE 10 MG/ML IV SOLN
INTRAVENOUS | Status: DC | PRN
  Administered 2012-06-02 (×2): 50 mg via INTRAVENOUS
  Administered 2012-06-02: 20 mg via INTRAVENOUS
  Administered 2012-06-02 (×2): 50 mg via INTRAVENOUS
  Administered 2012-06-02 (×2): 30 mg via INTRAVENOUS

## 2012-06-02 MED ORDER — SODIUM CHLORIDE 0.9 % IV SOLN
INTRAVENOUS | Status: DC
  Administered 2012-06-02: 15:00:00 via INTRAVENOUS

## 2012-06-02 MED ORDER — SODIUM CHLORIDE 0.9 % IV SOLN
100.0000 [IU] | INTRAVENOUS | Status: DC | PRN
  Administered 2012-06-02: 1 [IU]/h via INTRAVENOUS

## 2012-06-02 MED ORDER — SODIUM CHLORIDE 0.9 % IV SOLN
INTRAVENOUS | Status: DC
  Filled 2012-06-02: qty 40

## 2012-06-02 MED ORDER — NITROPRUSSIDE SODIUM 25 MG/ML IV SOLN
0.2500 ug/kg/min | INTRAVENOUS | Status: DC
  Administered 2012-06-02: 0.247 ug/kg/min via INTRAVENOUS
  Filled 2012-06-02 (×2): qty 2

## 2012-06-02 MED ORDER — NITROGLYCERIN IN D5W 200-5 MCG/ML-% IV SOLN
INTRAVENOUS | Status: DC | PRN
  Administered 2012-06-02: 10 ug/min via INTRAVENOUS

## 2012-06-02 MED ORDER — FAMOTIDINE IN NACL 20-0.9 MG/50ML-% IV SOLN
20.0000 mg | Freq: Two times a day (BID) | INTRAVENOUS | Status: AC
  Administered 2012-06-02 (×2): 20 mg via INTRAVENOUS
  Filled 2012-06-02: qty 50

## 2012-06-02 MED ORDER — PANTOPRAZOLE SODIUM 40 MG PO TBEC
40.0000 mg | DELAYED_RELEASE_TABLET | Freq: Every day | ORAL | Status: DC
  Administered 2012-06-04 – 2012-06-16 (×13): 40 mg via ORAL
  Filled 2012-06-02 (×12): qty 1

## 2012-06-02 MED ORDER — VANCOMYCIN HCL 1000 MG IV SOLR
INTRAVENOUS | Status: DC | PRN
  Administered 2012-06-02: 13:00:00

## 2012-06-02 MED ORDER — HEPARIN SODIUM (PORCINE) 1000 UNIT/ML IJ SOLN
INTRAMUSCULAR | Status: DC | PRN
  Administered 2012-06-02: 33000 [IU] via INTRAVENOUS

## 2012-06-02 MED ORDER — FENTANYL CITRATE 0.05 MG/ML IJ SOLN
INTRAMUSCULAR | Status: DC | PRN
  Administered 2012-06-02: 50 ug via INTRAVENOUS
  Administered 2012-06-02: 100 ug via INTRAVENOUS
  Administered 2012-06-02: 200 ug via INTRAVENOUS
  Administered 2012-06-02 (×3): 100 ug via INTRAVENOUS
  Administered 2012-06-02: 200 ug via INTRAVENOUS
  Administered 2012-06-02: 100 ug via INTRAVENOUS

## 2012-06-02 MED ORDER — 0.9 % SODIUM CHLORIDE (POUR BTL) OPTIME
TOPICAL | Status: DC | PRN
  Administered 2012-06-02: 1000 mL

## 2012-06-02 MED ORDER — DESMOPRESSIN ACETATE 4 MCG/ML IJ SOLN
20.0000 ug | Freq: Once | INTRAMUSCULAR | Status: AC
  Administered 2012-06-02: 20 ug via INTRAVENOUS
  Filled 2012-06-02: qty 5

## 2012-06-02 MED ORDER — BISACODYL 5 MG PO TBEC
10.0000 mg | DELAYED_RELEASE_TABLET | Freq: Every day | ORAL | Status: DC
  Administered 2012-06-04 – 2012-06-07 (×3): 10 mg via ORAL
  Filled 2012-06-02 (×3): qty 2

## 2012-06-02 MED ORDER — VANCOMYCIN HCL IN DEXTROSE 1-5 GM/200ML-% IV SOLN
1000.0000 mg | Freq: Two times a day (BID) | INTRAVENOUS | Status: AC
  Administered 2012-06-02 – 2012-06-03 (×3): 1000 mg via INTRAVENOUS
  Filled 2012-06-02 (×3): qty 200

## 2012-06-02 MED ORDER — DEXMEDETOMIDINE HCL IN NACL 400 MCG/100ML IV SOLN
0.4000 ug/kg/h | INTRAVENOUS | Status: DC
  Filled 2012-06-02: qty 100

## 2012-06-02 MED ORDER — RIFAMPIN 300 MG PO CAPS
600.0000 mg | ORAL_CAPSULE | Freq: Once | ORAL | Status: AC
  Administered 2012-06-03: 600 mg via ORAL
  Filled 2012-06-02: qty 2

## 2012-06-02 MED ORDER — SODIUM CHLORIDE 0.45 % IV SOLN
INTRAVENOUS | Status: DC
  Administered 2012-06-02: 15:00:00 via INTRAVENOUS
  Administered 2012-06-03: 20 mL via INTRAVENOUS

## 2012-06-02 MED ORDER — SODIUM CHLORIDE 0.9 % IJ SOLN: 3.0000 mL | INTRAMUSCULAR | Status: DC | PRN

## 2012-06-02 MED ORDER — LACTATED RINGERS IV SOLN: INTRAVENOUS | Status: DC

## 2012-06-02 MED ORDER — ARTIFICIAL TEARS OP OINT
TOPICAL_OINTMENT | OPHTHALMIC | Status: DC | PRN
  Administered 2012-06-02: 1 via OPHTHALMIC

## 2012-06-02 MED ORDER — VANCOMYCIN HCL 1000 MG IV SOLR
1000.0000 mg | INTRAVENOUS | Status: AC
  Administered 2012-06-02: 1000 mg
  Filled 2012-06-02: qty 1000

## 2012-06-02 MED ORDER — EPINEPHRINE HCL 1 MG/ML IJ SOLN
0.0000 ug/min | INTRAMUSCULAR | Status: DC
  Filled 2012-06-02: qty 4

## 2012-06-02 MED ORDER — FLUCONAZOLE IN SODIUM CHLORIDE 400-0.9 MG/200ML-% IV SOLN
400.0000 mg | Freq: Once | INTRAVENOUS | Status: AC
  Administered 2012-06-03: 400 mg via INTRAVENOUS
  Filled 2012-06-02: qty 200

## 2012-06-02 MED ORDER — DEXMEDETOMIDINE HCL IN NACL 200 MCG/50ML IV SOLN
0.1000 ug/kg/h | INTRAVENOUS | Status: DC
  Administered 2012-06-02 – 2012-06-03 (×5): 0.7 ug/kg/h via INTRAVENOUS
  Administered 2012-06-03: 0.2 ug/kg/h via INTRAVENOUS
  Administered 2012-06-03: 0.7 ug/kg/h via INTRAVENOUS
  Filled 2012-06-02 (×7): qty 50

## 2012-06-02 MED ORDER — ROCURONIUM BROMIDE 100 MG/10ML IV SOLN
INTRAVENOUS | Status: DC | PRN
  Administered 2012-06-02 (×3): 50 mg via INTRAVENOUS

## 2012-06-02 MED ORDER — CEFUROXIME SODIUM 1.5 G IJ SOLR
1.5000 g | Freq: Two times a day (BID) | INTRAMUSCULAR | Status: AC
  Administered 2012-06-02 – 2012-06-04 (×4): 1.5 g via INTRAVENOUS
  Filled 2012-06-02 (×4): qty 1.5

## 2012-06-02 MED ORDER — DOPAMINE-DEXTROSE 3.2-5 MG/ML-% IV SOLN
0.0000 ug/kg/min | INTRAVENOUS | Status: DC
  Administered 2012-06-04: 3 ug/kg/min via INTRAVENOUS
  Filled 2012-06-02: qty 250

## 2012-06-02 MED ORDER — ASPIRIN 300 MG RE SUPP
300.0000 mg | Freq: Every day | RECTAL | Status: DC
  Filled 2012-06-02 (×14): qty 1

## 2012-06-02 MED ORDER — ALBUMIN HUMAN 5 % IV SOLN
250.0000 mL | INTRAVENOUS | Status: AC | PRN
  Administered 2012-06-02 – 2012-06-03 (×4): 250 mL via INTRAVENOUS
  Filled 2012-06-02: qty 500

## 2012-06-02 MED ORDER — ASPIRIN 81 MG PO CHEW: 324.0000 mg | CHEWABLE_TABLET | Freq: Every day | ORAL | Status: DC

## 2012-06-02 MED ORDER — MILRINONE IN DEXTROSE 20 MG/100ML IV SOLN
0.1250 ug/kg/min | INTRAVENOUS | Status: DC
  Administered 2012-06-02: 0.3 ug/kg/min via INTRAVENOUS
  Administered 2012-06-03: 0.4 ug/kg/min via INTRAVENOUS
  Administered 2012-06-04 – 2012-06-09 (×9): 0.3 ug/kg/min via INTRAVENOUS
  Administered 2012-06-09 – 2012-06-10 (×2): 0.2 ug/kg/min via INTRAVENOUS
  Administered 2012-06-10 – 2012-06-11 (×2): 0.125 ug/kg/min via INTRAVENOUS
  Filled 2012-06-02 (×18): qty 100

## 2012-06-02 SURGICAL SUPPLY — 116 items
ADAPTER CARDIO PERF ANTE/RETRO (ADAPTER) ×3 IMPLANT
ADAPTER DLP PERFUSION .25INX2I (MISCELLANEOUS) ×3 IMPLANT
ADH SKN CLS APL DERMABOND .7 (GAUZE/BANDAGES/DRESSINGS) ×1
ADPR CRDPLG .25X.64 STRL (MISCELLANEOUS) ×1
ADPR PRFSN 84XANTGRD RTRGD (ADAPTER) ×1
APPLICATOR CHLORAPREP 3ML ORNG (MISCELLANEOUS) ×3 IMPLANT
ATTRACTOMAT 16X20 MAGNETIC DRP (DRAPES) ×3 IMPLANT
BAG DECANTER FOR FLEXI CONT (MISCELLANEOUS) ×9 IMPLANT
BLADE STERNUM SYSTEM 6 (BLADE) ×3 IMPLANT
BLADE SURG 10 STRL SS (BLADE) ×3 IMPLANT
BLADE SURG 12 STRL SS (BLADE) ×3 IMPLANT
BLADE SURG 15 STRL LF DISP TIS (BLADE) ×1 IMPLANT
BLADE SURG 15 STRL SS (BLADE) ×3
CANISTER SUCTION 2500CC (MISCELLANEOUS) ×3 IMPLANT
CANNULA ARTERIAL NVNT 3/8 20FR (MISCELLANEOUS) ×3 IMPLANT
CANNULA SOFTFLOW AORTIC 7M21FR (CANNULA) ×2 IMPLANT
CANNULA VENOUS LOW PROF 34X46 (CANNULA) ×3 IMPLANT
CATH CPB KIT VANTRIGT (MISCELLANEOUS) IMPLANT
CATH FOLEY 2WAY SLVR  5CC 14FR (CATHETERS) ×2
CATH FOLEY 2WAY SLVR 5CC 14FR (CATHETERS) ×1 IMPLANT
CATH HEART VENT LEFT (CATHETERS) ×1 IMPLANT
CATH HYDRAGLIDE XL THORACIC (CATHETERS) ×3 IMPLANT
CATH ROBINSON RED A/P 18FR (CATHETERS) ×6 IMPLANT
CATH THORACIC 28FR (CATHETERS) IMPLANT
CATH THORACIC 36FR RT ANG (CATHETERS) ×4 IMPLANT
CHLORAPREP W/TINT 26ML (MISCELLANEOUS) ×3 IMPLANT
CLOTH BEACON ORANGE TIMEOUT ST (SAFETY) ×3 IMPLANT
CONN ST 1/4X3/8  BEN (MISCELLANEOUS) ×2
CONN ST 1/4X3/8 BEN (MISCELLANEOUS) ×2 IMPLANT
CONT SPEC 4OZ CLIKSEAL STRL BL (MISCELLANEOUS) ×2 IMPLANT
COVER MAYO STAND STRL (DRAPES) ×2 IMPLANT
COVER SURGICAL LIGHT HANDLE (MISCELLANEOUS) ×6 IMPLANT
CRADLE DONUT ADULT HEAD (MISCELLANEOUS) ×3 IMPLANT
DERMABOND ADVANCED (GAUZE/BANDAGES/DRESSINGS) ×2
DERMABOND ADVANCED .7 DNX12 (GAUZE/BANDAGES/DRESSINGS) IMPLANT
DRAIN CHANNEL 28F RND 3/8 FF (WOUND CARE) IMPLANT
DRAIN CHANNEL 32F RND 10.7 FF (WOUND CARE) ×2 IMPLANT
DRAPE BILATERAL SPLIT (DRAPES) ×3 IMPLANT
DRAPE CARDIOVASCULAR INCISE (DRAPES) ×3
DRAPE INCISE IOBAN 66X45 STRL (DRAPES) ×3 IMPLANT
DRAPE SLUSH MACHINE 52X66 (DRAPES) ×3 IMPLANT
DRAPE SLUSH/WARMER DISC (DRAPES) ×3 IMPLANT
DRAPE SRG 135X102X78XABS (DRAPES) ×1 IMPLANT
DRSG COVADERM 4X14 (GAUZE/BANDAGES/DRESSINGS) ×3 IMPLANT
ELECT BLADE 4.0 EZ CLEAN MEGAD (MISCELLANEOUS) ×3
ELECT BLADE 6.5 EXT (BLADE) ×5 IMPLANT
ELECT CAUTERY BLADE 6.4 (BLADE) ×3 IMPLANT
ELECT PAD GROUND ADT 9 (MISCELLANEOUS) ×4 IMPLANT
ELECT REM PT RETURN 9FT ADLT (ELECTROSURGICAL) ×3
ELECTRODE BLDE 4.0 EZ CLN MEGD (MISCELLANEOUS) ×1 IMPLANT
ELECTRODE REM PT RTRN 9FT ADLT (ELECTROSURGICAL) ×1 IMPLANT
FELT TEFLON 6X6 (MISCELLANEOUS) ×3 IMPLANT
GAUZE XEROFORM 5X9 LF (GAUZE/BANDAGES/DRESSINGS) ×2 IMPLANT
GLOVE BIOGEL M 6.5 STRL (GLOVE) ×3 IMPLANT
GLOVE BIOGEL PI ORTHO PRO 7.5 (GLOVE) ×4
GLOVE PI ORTHO PRO STRL 7.5 (GLOVE) ×2 IMPLANT
GOWN PREVENTION PLUS XLARGE (GOWN DISPOSABLE) ×6 IMPLANT
GOWN STRL NON-REIN LRG LVL3 (GOWN DISPOSABLE) ×12 IMPLANT
HEMOSTAT POWDER SURGIFOAM 1G (HEMOSTASIS) ×12 IMPLANT
HEMOSTAT SURGICEL 2X14 (HEMOSTASIS) ×3 IMPLANT
HM II SYSTEM CONTROLLER ×2 IMPLANT
HM II VENTR. ASSIST DEVICE BP (Prosthesis & Implant Heart) ×3 IMPLANT
INSERT FOGARTY XLG (MISCELLANEOUS) ×1 IMPLANT
KIT BASIN OR (CUSTOM PROCEDURE TRAY) ×3 IMPLANT
KIT PAIN CUSTOM (MISCELLANEOUS) IMPLANT
KIT ROOM TURNOVER OR (KITS) ×3 IMPLANT
KIT SUCTION CATH 14FR (SUCTIONS) ×3 IMPLANT
LEAD PACING MYOCARDI (MISCELLANEOUS) ×6 IMPLANT
NS IRRIG 1000ML POUR BTL (IV SOLUTION) ×15 IMPLANT
PACK OPEN HEART (CUSTOM PROCEDURE TRAY) ×3 IMPLANT
PAD ARMBOARD 7.5X6 YLW CONV (MISCELLANEOUS) ×8 IMPLANT
PAD DEFIB R2 (MISCELLANEOUS) ×3 IMPLANT
PUNCH AORTIC ROTATE 4.5MM 8IN (MISCELLANEOUS) ×3 IMPLANT
SEALANT SURG COSEAL 8ML (VASCULAR PRODUCTS) ×6 IMPLANT
SHEATH AVANTI 11CM 5FR (MISCELLANEOUS) ×2 IMPLANT
SPONGE GAUZE 4X4 12PLY (GAUZE/BANDAGES/DRESSINGS) ×8 IMPLANT
STOPCOCK 4 WAY LG BORE MALE ST (IV SETS) ×3 IMPLANT
SUCKER INTRACARDIAC WEIGHTED (SUCKER) ×3 IMPLANT
SUT ETHIBOND 2 0 SH (SUTURE) ×15
SUT ETHIBOND 2 0 SH 36X2 (SUTURE) ×5 IMPLANT
SUT ETHIBOND 5 LR DA (SUTURE) ×6 IMPLANT
SUT ETHIBOND NAB MH 2-0 36IN (SUTURE) ×40 IMPLANT
SUT ETHILON 3 0 FSL (SUTURE) ×2 IMPLANT
SUT PDS AB 1 CTX 36 (SUTURE) ×4 IMPLANT
SUT PDS AB 3-0 SH 27 (SUTURE) ×3 IMPLANT
SUT PROLENE 3 0 SH DA (SUTURE) ×6 IMPLANT
SUT PROLENE 3 0 SH1 36 (SUTURE) ×2 IMPLANT
SUT PROLENE 4 0 RB 1 (SUTURE) ×12
SUT PROLENE 4-0 RB1 .5 CRCL 36 (SUTURE) ×2 IMPLANT
SUT SILK  1 MH (SUTURE) ×6
SUT SILK 1 MH (SUTURE) ×3 IMPLANT
SUT SILK 1 TIES 10X30 (SUTURE) ×3 IMPLANT
SUT SILK 2 0 SH CR/8 (SUTURE) ×6 IMPLANT
SUT SILK 2 0SH CR/8 30 (SUTURE) ×2 IMPLANT
SUT SILK 3 0SH CR/8 30 (SUTURE) ×2 IMPLANT
SUT STEEL 6MS V (SUTURE) ×6 IMPLANT
SUT STEEL SZ 6 DBL 3X14 BALL (SUTURE) ×3 IMPLANT
SUT TEM PAC WIRE 2 0 SH (SUTURE) ×6 IMPLANT
SUT VIC AB 1 CTX 18 (SUTURE) ×6 IMPLANT
SUT VIC AB 1 CTX 36 (SUTURE) ×9
SUT VIC AB 1 CTX36XBRD ANBCTR (SUTURE) ×2 IMPLANT
SUT VIC AB 2-0 CTX 27 (SUTURE) ×6 IMPLANT
SUT VIC AB 3-0 SH 8-18 (SUTURE) ×7 IMPLANT
SUT VIC AB 3-0 X1 27 (SUTURE) ×8 IMPLANT
SUTURE E-PAK OPEN HEART (SUTURE) IMPLANT
SYR 50ML LL SCALE MARK (SYRINGE) ×3 IMPLANT
SYSTEM SAHARA CHEST DRAIN ATS (WOUND CARE) ×3 IMPLANT
TAPE CLOTH SURG 4X10 WHT LF (GAUZE/BANDAGES/DRESSINGS) ×6 IMPLANT
TOWEL OR 17X26 10 PK STRL BLUE (TOWEL DISPOSABLE) ×9 IMPLANT
TRAY CATH LUMEN 1 20CM STRL (SET/KITS/TRAYS/PACK) ×3 IMPLANT
TRAY FOLEY IC TEMP SENS 14FR (CATHETERS) ×3 IMPLANT
TUBE SUCT INTRACARD DLP 20F (MISCELLANEOUS) ×3 IMPLANT
TUBING ART PRESS 48 MALE/FEM (TUBING) ×4 IMPLANT
UNDERPAD 30X30 INCONTINENT (UNDERPADS AND DIAPERS) ×3 IMPLANT
VENT LEFT HEART 12002 (CATHETERS) ×3
WATER STERILE IRR 1000ML POUR (IV SOLUTION) ×6 IMPLANT

## 2012-06-02 NOTE — Progress Notes (Signed)
   06/02/12 2100  INOMAX  NO2 (ppm) 0.2 ppm  NO (ppm) 24 ppm  Oxygen (%) 46 %  Tank PSI 1650 PSI   Begin nitric oxide wean per MD order. CVP 11, PAP 31/17.

## 2012-06-02 NOTE — Brief Op Note (Signed)
05/30/2012 - 06/02/2012  6:39 PM  PATIENT:  Robert [name]  57 y.o. male  PRE-OPERATIVE DIAGNOSIS: non ischemic cardiomyopathy; acute on chronic heart failure  POST-OPERATIVE DIAGNOSIS: non cardiomyopathy, chronic heart failure  PROCEDURE:  Procedure(s) with comments: INSERTION OF IMPLANTABLE LEFT VENTRICULAR ASSIST DEVICE (N/A) - Nitric Oxide; TEE; Medtronic AICD INTRAOPERATIVE TRANSESOPHAGEAL ECHOCARDIOGRAM (N/A)  SURGEON:  Surgeon(s) and Role:    * Kerin Perna, MD - Primary    * Alleen Borne, MD - Assisting      PHYSICIAN ASSISTANT: 0  ASSISTANTS:  Francesca Jewett RNFA,Gengler RNFA  ANESTHESIA:   general  EBL:  Total I/O In: 6526.5 [I.V.:1849.5; Blood:3267; IV Piggyback:1410] Out: 5465 [Urine:2925; Blood:1950; Chest Tube:590]  BLOOD ADMINISTERED:2 platelets, 2 FFP  DRAINS: MT, L PT, VAD pocket drain   LOCAL MEDICATIONS USED0  SPECIMEN:  LV apical plug  DISPOSITION OF SPECIMEN:  PATHOLOGY  COUNTS:  YES  TOURNIQUET DICTATION: .Dragon Dictation  PLAN OF CARE: Admit to inpatient   PATIENT DISPOSITION:  ICU - intubated and critically ill.   Delay start of Pharmacological VTE agent (>24hrs) due to surgical blood loss or risk of bleeding yes

## 2012-06-02 NOTE — Progress Notes (Signed)
TCTS  The patient was examined and preop studies reviewed. There has been no change from the prior exam and the patient is ready for surgery.  Plan VAD implant on Robert [name] for end stage HF this am

## 2012-06-02 NOTE — Progress Notes (Signed)
ANTICOAGULATION CONSULT NOTE - Follow Up Consult  Pharmacy Consult for heparin Indication: IABP  Labs:  Recent Labs  05/30/12 1218 05/30/12 2126 05/31/12 0440  06/01/12 0500 06/01/12 1050 06/01/12 1400 06/01/12 2100 06/02/12 0400  HGB 14.9 13.5  --   --  11.9*  --   --   --  11.5*  HCT 42.5 39.1  --   --  34.5*  --   --   --  33.3*  PLT 173 158  --   --  110*  --   --   --  96*  APTT  --   --   --   --   --   --   --  65*  --   LABPROT 13.6  --   --   --   --   --   --   --   --   INR 1.05  --   --   --   --   --   --   --   --   HEPARINUNFRC  --   --   --   < > 0.17*  --  0.14* 0.18* 0.27*  CREATININE 1.25 1.05 1.00  --  1.13 1.19  --   --   --   < > = values in this interval not displayed.   Assessment/Plan:  57yo male now therapeutic on heparin with low goal for IABP with plan for LVAD placement today.  Will continue gtt and f/u after OR.  Colleen Can PharmD BCPS 06/02/2012,4:37 AM

## 2012-06-02 NOTE — OR Nursing (Signed)
Family update given by [name] @1225 

## 2012-06-02 NOTE — Anesthesia Postprocedure Evaluation (Signed)
  Anesthesia Post-op Note  Patient: Robert [name]  Procedure(s) Performed: Procedure(s) with comments: INSERTION OF IMPLANTABLE LEFT VENTRICULAR ASSIST DEVICE (N/A) - Nitric Oxide; TEE; Medtronic AICD INTRAOPERATIVE TRANSESOPHAGEAL ECHOCARDIOGRAM (N/A)  Patient Location: SICU  Anesthesia Type:General  Level of Consciousness: sedated and Patient remains intubated per anesthesia plan  Airway and Oxygen Therapy: Patient remains intubated per anesthesia plan and Patient placed on Ventilator (see vital sign flow sheet for setting)  Post-op Pain: none  Post-op Assessment: Post-op Vital signs reviewed and Patient's Cardiovascular Status Stable  Post-op Vital Signs: stable  Complications: No apparent anesthesia complications

## 2012-06-02 NOTE — Progress Notes (Signed)
CSW met with patient's son Robert [name] today while patient was in surgery to provide emotional support and monitoring.  Son stated that no other family was present.  He was given a card with CSW's name and number and encouraged to call if he has any questions. Robert [name] wanted to talk to CSW about issues regarding disability for his father and related that patient/ family needs to consider enlisting the help of a disability attorney as patient states he has been denied disability 4 times.  He has also been denied Medicaid in the past due to having several homes and cars. CSW will be following up with patient and family during this hospital stay along with help of financial services to assist with status of both disability and medicaid applications.  Son was also concerned about his father's "aftercare" when he returns home and he is aware that home health will be set up for patient at d/c to help support family's care of patient.  [name] stated that his worry is that home health will not be "enough" support for the family and wants to consider hiring a private nurse if necessary to come in daily and check on patient and VAD site.  CSW  Explained that a private hire nurse could be arranged by family and a list of private duty agencies  Could be provided- however- the nurse(s) hired would have to have training and knowledge of the needs of a patient with a VAD.  CSW will discuss further with [name], VAD coordinator and the HF team to determine feasibility of above.   Son stated that he felt better after talking with CSW and agreed to call with any further questions.  CSW will follow up and check on patient first of next week. [name]. [name]  [phone]

## 2012-06-02 NOTE — Anesthesia Procedure Notes (Signed)
Procedures PA catheter:  Routine monitors. Timeout, sterile prep, drape, FBP L neck.  Trendelenburg position.  1% Lido local, finder and trocar LIJ 1st pass with US guidance.  Cordis placed over J wire. PA catheter in easily, transient PVCs.  Sterile dressing applied.  Patient tolerated well, VSS.  Sandford Craze, MD [phone]

## 2012-06-02 NOTE — OR Nursing (Signed)
2nd call SICU charge [phone].

## 2012-06-02 NOTE — Progress Notes (Signed)
PA pressure 33/25

## 2012-06-02 NOTE — Progress Notes (Addendum)
Peep on 8 when RT entered room. Per RN, MD advanced peep to 8. Ulla Potash NP notified of the need for an order for peep of 8.

## 2012-06-02 NOTE — Anesthesia Preprocedure Evaluation (Addendum)
Anesthesia Evaluation  Patient identified by MRN, date of birth, ID band Patient awake    Reviewed: Allergy & Precautions, H&P , NPO status , Patient's Chart, lab work & pertinent test results, reviewed documented beta blocker date and time   Airway Mallampati: II TM Distance: >3 FB Neck ROM: Full    Dental  (+) Teeth Intact and Dental Advisory Given   Pulmonary  breath sounds clear to auscultation        Cardiovascular hypertension, +CHF + dysrhythmias Ventricular Tachycardia + Cardiac Defibrillator (turned off 05/02/12 @ 0623) Rhythm:Regular Rate:Normal  IABP in situ 1:1    Neuro/Psych    GI/Hepatic   Endo/Other    Renal/GU Renal InsufficiencyRenal disease     Musculoskeletal   Abdominal   Peds  Hematology   Anesthesia Other Findings   Reproductive/Obstetrics                          Anesthesia Physical Anesthesia Plan  ASA: IV  Anesthesia Plan: General   Post-op Pain Management:    Induction: Intravenous  Airway Management Planned: Oral ETT  Additional Equipment: Arterial line, CVP, PA Cath and 3D TEE  Intra-op Plan:   Post-operative Plan: Post-operative intubation/ventilation  Informed Consent: I have reviewed the patients History and Physical, chart, labs and discussed the procedure including the risks, benefits and alternatives for the proposed anesthesia with the patient or authorized representative who has indicated his/her understanding and acceptance.   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

## 2012-06-02 NOTE — OR Nursing (Signed)
1st call to SICU charge nurse @1230 .

## 2012-06-02 NOTE — CV Procedure (Signed)
Intraoperative Transesophageal Echocardiography Note:   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 He's been on the cardiac transplant list at [facility] but has had symptoms of worsening heart failure was admitted to [facility] and placed on a balloon pump and IV milrinone. He is now scheduled to undergo insertion of a Heartmate II LVAD by Dr. Donata Clay. Intraoperative transesophageal echocardiography was requested to assess the heart prior to LVAD insertion and to assist with determining optimal function of the  LVAD following separation from cardiopulmonary bypass.  The patient was brought to the operating room at [facility] and general anesthesia was induced without difficulty. Following endotracheal intubation and orogastric suctioning, the transesophageal echocardiography probe was inserted into the esophagus without difficulty.  Impression: Pre-bypass findings:  1. Aortic valve: The aortic valve was trileaflet. The leaflets opened normally. There was no thickening or calcification of the leaflets noted. No aortic insufficiency was appreciated.  2. Mitral valve: The mitral valve leaflets appeared structurally normal. However there was marked dilatation of the mitral annulus and lateral displacement of the papillary muscles which resulted in tethering of the mitral valve leaflets and apical displacement the coaptation point. There was a central jet of mitral regurgitation which was graded 2-3+. There were no flail or billowing segments noted.   3. Left ventricle: The left ventricle was markedly dilated t with severe global hypokinesis. The ventricular end-diastolic diameter measured 8.8 cm at end diastole at the mid-papillary level in the in the transgastric short axis view.  There was thinning of the left ventricular ventricular walls which was diffuse. The posterior wall measured 0.68  cm and the anterior wall measured 0.78 cm at end diastole at the mid-papillary level. There was no thrombus noted in the left ventricular apex. Ejection fraction was estimated 10-15%.  4. Right ventricle: The right ventricular cavity was mildly enlarged. However there was appeared to be adequate contractility the right ventricular free wall.  5. Tricuspid valve: There there was a defibrillator lead noted through the tricuspid valve. However the the valve appeared structurally normal and there was trace to 1+ tricuspid insufficiency.  6. Interatrial septum: Interatrial septum was intact without evidence of patent foramen ovale or atrial septal defect by color Doppler and multiple bubble studies using Valsalva with the ventilator.  7. Left atrium: There was no thrombus noted in the left atrium or left atrial appendage.  8. Ascending aorta: The ascending aorta showed a well-defined aortic root and sinotubular ridge. There was no atheromatous disease noted in the ascending aorta. Is no dilatation of the ascending aorta. There is no effacement of the aortic root.  9. Descending aorta: The intra-aortic balloon pump was noted to be in the descending aorta below the level in the left subclavian artery. There was no atheromatous disease noted in the descending aorta.  Post-bypass findings:  1. Aortic valve: The aortic valve was  opening intermittently depending on the pump flow rate and volume status of the left ventricle. However there was no aortic insufficiency and the leaflets appeared to open normally. Upon  completion of the surgery, the  aortic valve appeared to open every 5-6 beats.  2. Mitral valve: Upon separation cardiopulmonary bypass, the left ventricular dimensions appear to decrease and there was improved coaptation of the mitral leaflets. There remained 2+ mitral insufficiency with a central jet.  3. Left ventricle: The LVAD inflow cannula was well visualized and appear to  be in good  position near the apex and oriented towards the opening of the mitral valve and not abutting the septum or lateral wall. There appeared to be laminar flow within the inflow cannula. The peak inflow velocity was 0.8 m/s using the pulse wave Doppler. The left ventricular cavity was reduced in size and there again was global left ventricular hypokinesis. Upon separation of cardiopulmonary bypass, there were some small pockets of air noted within the left ventricular cavity which cleared over the next 10-15 minutes.  4.. Right ventricle: The right ventricular cavity size appeared unchanged from the pre-bypass study. There appeared to be adequate contractility of the right ventricular free wall.  5. Tricuspid valve: The tricuspid valve was structurally normal appearing and there was a defibrillator lead through the tricuspid valve as noted preoperatively: There was trace tricuspid insufficiency.  6. Interatrial septum: The interatrial septum was intact without evidence of patent foramen ovale or atrial septal defect By color Doppler.  7. Left atrium: There was no thrombus noted in the left atrial cavity or left atrial appendage. Upon separation from cardiopulmonary bypass and initiation of the pump flow, there was some air noted within the left atrial cavity but this cleared over the next 15 minutes.

## 2012-06-02 NOTE — Transfer of Care (Signed)
Immediate Anesthesia Transfer of Care Note  Patient: Robert [name]  Procedure(s) Performed: Procedure(s) with comments: INSERTION OF IMPLANTABLE LEFT VENTRICULAR ASSIST DEVICE (N/A) - Nitric Oxide; TEE; [employer] AICD INTRAOPERATIVE TRANSESOPHAGEAL ECHOCARDIOGRAM (N/A)  Patient Location: SICU  Anesthesia Type:General  Level of Consciousness: Patient remains intubated per anesthesia plan  Airway & Oxygen Therapy: Patient remains intubated per anesthesia plan and Patient placed on Ventilator (see vital sign flow sheet for setting)  Post-op Assessment: Report given to PACU RN and Post -op Vital signs reviewed and stable  Post vital signs: Reviewed and stable  Complications: No apparent anesthesia complications

## 2012-06-02 NOTE — Progress Notes (Signed)
  Echocardiogram Echocardiogram Transesophageal has been performed.  Robert [name], Robert [name] 06/02/2012, 8:54 AM

## 2012-06-02 NOTE — Progress Notes (Signed)
Thrombocytopenia   The patient on Heparin; attention to follow up; might benefit from HIT panel

## 2012-06-02 NOTE — Progress Notes (Addendum)
Md reduced peep to 5 per RN. PA pressure 30/18.

## 2012-06-02 NOTE — Progress Notes (Signed)
PA pressure 22/12

## 2012-06-02 NOTE — Progress Notes (Signed)
Patient  wastransported to the OR on a balloon pump and milrinone drip.Pt is hemodynamically stable and in good spirit. Emotional support provided to patient and son. Labelled Pt belongings was  sent to 2300.

## 2012-06-03 ENCOUNTER — Inpatient Hospital Stay (HOSPITAL_COMMUNITY): Payer: BC Managed Care – PPO

## 2012-06-03 DIAGNOSIS — I5023 Acute on chronic systolic (congestive) heart failure: Secondary | ICD-10-CM

## 2012-06-03 DIAGNOSIS — J90 Pleural effusion, not elsewhere classified: Secondary | ICD-10-CM

## 2012-06-03 DIAGNOSIS — I428 Other cardiomyopathies: Secondary | ICD-10-CM

## 2012-06-03 DIAGNOSIS — Z95818 Presence of other cardiac implants and grafts: Secondary | ICD-10-CM

## 2012-06-03 LAB — GLUCOSE, CAPILLARY
Glucose-Capillary: 105 mg/dL — ABNORMAL HIGH (ref 70–99)
Glucose-Capillary: 109 mg/dL — ABNORMAL HIGH (ref 70–99)
Glucose-Capillary: 137 mg/dL — ABNORMAL HIGH (ref 70–99)
Glucose-Capillary: 94 mg/dL (ref 70–99)
Glucose-Capillary: 102 mg/dL — ABNORMAL HIGH (ref 70–99)

## 2012-06-03 LAB — POCT I-STAT 3, ART BLOOD GAS (G3+)
Bicarbonate: 24.3 mEq/L — ABNORMAL HIGH (ref 20.0–24.0)
Bicarbonate: 25 mEq/L — ABNORMAL HIGH (ref 20.0–24.0)
Bicarbonate: 26.1 mEq/L — ABNORMAL HIGH (ref 20.0–24.0)
O2 Saturation: 100 %
O2 Saturation: 98 %
TCO2: 27 mmol/L (ref 0–100)
pCO2 arterial: 34.8 mmHg — ABNORMAL LOW (ref 35.0–45.0)
pCO2 arterial: 39.3 mmHg (ref 35.0–45.0)
pCO2 arterial: 35.3 mmHg (ref 35.0–45.0)
pCO2 arterial: 39.1 mmHg (ref 35.0–45.0)
pCO2 arterial: 40.2 mmHg (ref 35.0–45.0)
pH, Arterial: 7.447 (ref 7.350–7.450)
pH, Arterial: 7.409 (ref 7.350–7.450)
pH, Arterial: 7.476 — ABNORMAL HIGH (ref 7.350–7.450)
pH, Arterial: 7.408 (ref 7.350–7.450)
pO2, Arterial: 183 mmHg — ABNORMAL HIGH (ref 80.0–100.0)
pO2, Arterial: 188 mmHg — ABNORMAL HIGH (ref 80.0–100.0)
pO2, Arterial: 207 mmHg — ABNORMAL HIGH (ref 80.0–100.0)

## 2012-06-03 LAB — ALT: ALT: 15 U/L (ref 0–53)

## 2012-06-03 LAB — POCT I-STAT, CHEM 8
BUN: 11 mg/dL (ref 6–23)
Calcium, Ion: 1.21 mmol/L (ref 1.12–1.23)
Calcium, Ion: 1.06 mmol/L — ABNORMAL LOW (ref 1.12–1.23)
Chloride: 108 mEq/L (ref 96–112)
Chloride: 106 mEq/L (ref 96–112)
Creatinine, Ser: 0.9 mg/dL (ref 0.50–1.35)
Glucose, Bld: 117 mg/dL — ABNORMAL HIGH (ref 70–99)
Glucose, Bld: 127 mg/dL — ABNORMAL HIGH (ref 70–99)
HCT: 25 % — ABNORMAL LOW (ref 39.0–52.0)
HCT: 27 % — ABNORMAL LOW (ref 39.0–52.0)
Hemoglobin: 8.5 g/dL — ABNORMAL LOW (ref 13.0–17.0)
Hemoglobin: 8.5 g/dL — ABNORMAL LOW (ref 13.0–17.0)
Potassium: 4.3 mEq/L (ref 3.5–5.1)
Sodium: 139 mEq/L (ref 135–145)
Sodium: 142 mEq/L (ref 135–145)
Sodium: 142 mEq/L (ref 135–145)
TCO2: 26 mmol/L (ref 0–100)
TCO2: 27 mmol/L (ref 0–100)

## 2012-06-03 LAB — CARBOXYHEMOGLOBIN
Carboxyhemoglobin: 1.8 % — ABNORMAL HIGH (ref 0.5–1.5)
Carboxyhemoglobin: 1 % (ref 0.5–1.5)
Methemoglobin: 1.4 % (ref 0.0–1.5)
Methemoglobin: 1 % (ref 0.0–1.5)
O2 Saturation: 77.4 %
O2 Saturation: 71.3 %
Total hemoglobin: 9.4 g/dL — ABNORMAL LOW (ref 13.5–18.0)
Total hemoglobin: 9.5 g/dL — ABNORMAL LOW (ref 13.5–18.0)

## 2012-06-03 LAB — PREPARE FRESH FROZEN PLASMA
Unit division: 0
Unit division: 0
Unit division: 0

## 2012-06-03 LAB — PRO B NATRIURETIC PEPTIDE: Pro B Natriuretic peptide (BNP): 2692 pg/mL — ABNORMAL HIGH (ref 0–125)

## 2012-06-03 LAB — CBC
HCT: 25.6 % — ABNORMAL LOW (ref 39.0–52.0)
HCT: 26.1 % — ABNORMAL LOW (ref 39.0–52.0)
HCT: 27 % — ABNORMAL LOW (ref 39.0–52.0)
Hemoglobin: 8.9 g/dL — ABNORMAL LOW (ref 13.0–17.0)
Hemoglobin: 9 g/dL — ABNORMAL LOW (ref 13.0–17.0)
Hemoglobin: 9.4 g/dL — ABNORMAL LOW (ref 13.0–17.0)
MCH: 29.7 pg (ref 26.0–34.0)
MCH: 29.3 pg (ref 26.0–34.0)
MCH: 29.8 pg (ref 26.0–34.0)
MCHC: 34.8 g/dL (ref 30.0–36.0)
MCHC: 34.5 g/dL (ref 30.0–36.0)
MCHC: 34.8 g/dL (ref 30.0–36.0)
MCV: 85.3 fL (ref 78.0–100.0)
MCV: 85 fL (ref 78.0–100.0)
MCV: 85.7 fL (ref 78.0–100.0)
Platelets: 96 10*3/uL — ABNORMAL LOW (ref 150–400)
Platelets: 93 10*3/uL — ABNORMAL LOW (ref 150–400)
Platelets: 91 10*3/uL — ABNORMAL LOW (ref 150–400)
RBC: 3 MIL/uL — ABNORMAL LOW (ref 4.22–5.81)
RBC: 3.07 MIL/uL — ABNORMAL LOW (ref 4.22–5.81)
RBC: 3.15 MIL/uL — ABNORMAL LOW (ref 4.22–5.81)
RDW: 13.2 % (ref 11.5–15.5)
RDW: 13.1 % (ref 11.5–15.5)
RDW: 13.2 % (ref 11.5–15.5)
WBC: 9.1 10*3/uL (ref 4.0–10.5)
WBC: 9.6 10*3/uL (ref 4.0–10.5)
WBC: 10.7 10*3/uL — ABNORMAL HIGH (ref 4.0–10.5)

## 2012-06-03 LAB — CBC WITH DIFFERENTIAL/PLATELET
Basophils Absolute: 0 10*3/uL (ref 0.0–0.1)
Basophils Relative: 0 % (ref 0–1)
Eosinophils Absolute: 0 10*3/uL (ref 0.0–0.7)
Eosinophils Relative: 0 % (ref 0–5)
Lymphocytes Relative: 4 % — ABNORMAL LOW (ref 12–46)
MCHC: 35.2 g/dL (ref 30.0–36.0)
MCV: 84.7 fL (ref 78.0–100.0)
Platelets: 96 10*3/uL — ABNORMAL LOW (ref 150–400)
RDW: 13.1 % (ref 11.5–15.5)
WBC: 8.3 10*3/uL (ref 4.0–10.5)

## 2012-06-03 LAB — BASIC METABOLIC PANEL
BUN: 12 mg/dL (ref 6–23)
BUN: 11 mg/dL (ref 6–23)
CO2: 24 mEq/L (ref 19–32)
CO2: 25 mEq/L (ref 19–32)
Calcium: 8.7 mg/dL (ref 8.4–10.5)
Calcium: 8.7 mg/dL (ref 8.4–10.5)
Chloride: 109 mEq/L (ref 96–112)
Creatinine, Ser: 0.95 mg/dL (ref 0.50–1.35)
Creatinine, Ser: 0.95 mg/dL (ref 0.50–1.35)
GFR calc Af Amer: >90 mL/min (ref >90)
GFR calc non Af Amer: >90 mL/min (ref >90)
Glucose, Bld: 107 mg/dL — ABNORMAL HIGH (ref 70–99)
Potassium: 4.4 mEq/L (ref 3.5–5.1)
Sodium: 142 mEq/L (ref 135–145)

## 2012-06-03 LAB — PREPARE PLATELET PHERESIS

## 2012-06-03 LAB — AST: AST: 56 U/L — ABNORMAL HIGH (ref 0–37)

## 2012-06-03 LAB — MAGNESIUM
Magnesium: 2.5 mg/dL (ref 1.5–2.5)
Magnesium: 2.5 mg/dL (ref 1.5–2.5)
Magnesium: 2.3 mg/dL (ref 1.5–2.5)

## 2012-06-03 LAB — CREATININE, SERUM
Creatinine, Ser: 0.94 mg/dL (ref 0.50–1.35)
GFR calc Af Amer: >90 mL/min (ref >90)
GFR calc non Af Amer: >90 mL/min (ref >90)

## 2012-06-03 LAB — LACTIC ACID, PLASMA: Lactic Acid, Venous: 1.3 mmol/L (ref 0.5–2.2)

## 2012-06-03 LAB — PROTIME-INR: Prothrombin Time: 16.8 seconds — ABNORMAL HIGH (ref 11.6–15.2)

## 2012-06-03 MED ORDER — WARFARIN - PHYSICIAN DOSING INPATIENT: Freq: Every day | Status: DC

## 2012-06-03 MED ORDER — ALBUMIN HUMAN 5 % IV SOLN
12.5000 g | INTRAVENOUS | Status: AC | PRN
  Administered 2012-06-03 (×2): 12.5 g via INTRAVENOUS
  Filled 2012-06-03 (×2): qty 250

## 2012-06-03 MED ORDER — WARFARIN SODIUM 2.5 MG PO TABS
2.5000 mg | ORAL_TABLET | Freq: Every day | ORAL | Status: DC
  Administered 2012-06-03: 2.5 mg via ORAL
  Filled 2012-06-03 (×2): qty 1

## 2012-06-03 MED ORDER — FUROSEMIDE 10 MG/ML IJ SOLN: 20.0000 mg | Freq: Once | INTRAMUSCULAR | Status: AC

## 2012-06-03 MED ORDER — FUROSEMIDE 10 MG/ML IJ SOLN
20.0000 mg | Freq: Once | INTRAMUSCULAR | Status: AC
  Administered 2012-06-03: 20 mg via INTRAVENOUS

## 2012-06-03 MED ORDER — POTASSIUM CHLORIDE 10 MEQ/50ML IV SOLN
10.0000 meq | INTRAVENOUS | Status: AC
  Administered 2012-06-03 (×2): 10 meq via INTRAVENOUS

## 2012-06-03 MED ORDER — CHLORHEXIDINE GLUCONATE 0.12 % MT SOLN
15.0000 mL | Freq: Two times a day (BID) | OROMUCOSAL | Status: DC
  Administered 2012-06-03: 15 mL via OROMUCOSAL

## 2012-06-03 MED ORDER — MORPHINE SULFATE 4 MG/ML IJ SOLN: 4.0000 mg | Freq: Once | INTRAMUSCULAR | Status: AC

## 2012-06-03 MED ORDER — FUROSEMIDE 10 MG/ML IJ SOLN
INTRAMUSCULAR | Status: AC
  Filled 2012-06-03: qty 2

## 2012-06-03 MED ORDER — LACTATED RINGERS IV SOLN
INTRAVENOUS | Status: DC
  Administered 2012-06-03: 10:00:00 via INTRAVENOUS

## 2012-06-03 NOTE — Progress Notes (Addendum)
HeartMate 2 Rounding Note  Subjective:     s/p HeartMate 2 VAD 2/28  Remains intubated/sedated. Nitric down to 4 - being weaned. Received chest tube for PTX.  Atrially paced at 90 per minute.   Flows on VAD still read ---. MAPs look good. I changed speed but ne effect. 1 PI event during CT insertion otherwise no events. Cardiac outputs and pulmonary pressures look great on swan.   LVAD INTERROGATION:  HeartMate II LVAD:  Flow---liters/min, speed 9200, power4.8, PI6.2.  Controller    Objective:    Vital Signs:   Temp:  [95.7 F (35.4 C)-98.4 F (36.9 C)] 97.2 F (36.2 C) (03/01 1100) Pulse Rate:  [41-99] 90 (03/01 1100) Resp:  [0-24] 13 (03/01 1100) BP: (80-117)/(73-83) 87/80 mmHg (03/01 0259) SpO2:  [97 %-100 %] 100 % (03/01 1100) Arterial Line BP: (79-136)/(63-90) 94/84 mmHg (03/01 1100) FiO2 (%):  [50 %] 50 % (03/01 0830) Weight:  [100.7 kg (222 lb 0.1 oz)] 100.7 kg (222 lb 0.1 oz) (03/01 0500) Last BM Date: 06/01/12 Mean arterial Pressure 84-92 mmHg  Intake/Output:   Intake/Output Summary (Last 24 hours) at 06/03/12 1111 Last data filed at 06/03/12 1100  Gross per 24 hour  Intake 9972.2 ml  Output   8975 ml  Net  997.2 ml     Physical Exam: General:  sedated on ventilator HEENT: + ETT tube Neck: supple. L swan in place Cor:  LVAD hum present. Right-sided heart sounds present Lungs: clear Abdomen: soft, nontender, nondistended. No hepatosplenomegaly. No bruits or masses. Good bowel sounds. Extremities: no cyanosis, clubbing, rash, mild edema IABP arteriotomy site ok Neuro: alert & orientedx3, cranial nerves grossly intact. moves all 4 extremities w/o difficulty. Affect pleasant  Telemetry: A paced at 90  Labs: Basic Metabolic Panel:  Recent Labs Lab 05/29/12 1243  06/01/12 1050 06/02/12 0400  06/02/12 1445 06/02/12 1455 06/02/12 2008 06/02/12 2019 06/03/12 0115 06/03/12 0121 06/03/12 0600  NA 135  < > 135 137  < > 142 141 141  --  140 139 142  K  3.4*  < > 4.2 4.4  < > 3.5 3.5 3.7  --  4.3 4.3 4.4  CL 96  < > 101 104  --  107 107 107  --  108 108 109  CO2 27  < > 25 26  --  26  --   --   --  24  --  25  GLUCOSE 123*  < > 111* 88  < > 102* 102* 115*  --  119* 117* 107*  BUN 17  < > 18 17  --  14 13 13   --  12 11 11   CREATININE 1.19  < > 1.19 1.27  --  1.04 1.00 1.00 1.04 0.95 1.10 0.95  CALCIUM 9.8  < > 8.8 8.5  --  8.7  --   --   --  8.7  --  8.7  MG 2.3  --   --   --   --  2.0  --   --  2.8* 2.5  --  2.5  PHOS  --   --   --   --   --   --   --   --   --  4.2  --   --   < > = values in this interval not displayed.  Liver Function Tests:  Recent Labs Lab 06/01/12 1050 06/03/12 0115  AST 12 56*  ALT 11 15  ALKPHOS 56  --  BILITOT 1.2 2.2*  PROT 5.9*  --   ALBUMIN 3.1*  --    No results found for this basename: LIPASE, AMYLASE,  in the last 168 hours No results found for this basename: AMMONIA,  in the last 168 hours  CBC:  Recent Labs Lab 06/02/12 0400  06/02/12 1445  06/02/12 1500 06/02/12 1844 06/02/12 2008 06/03/12 0115 06/03/12 0121 06/03/12 0600  WBC 8.7  --  10.1  --   --  8.5  --  8.3  --  9.1  NEUTROABS  --   --   --   --   --   --   --  7.0  --   --   HGB 11.5*  < > 9.4*  < >  --  8.5* 8.2* 9.2* 8.5* 8.9*  HCT 33.3*  < > 27.2*  < >  --  24.4* 24.0* 26.1* 25.0* 25.6*  MCV 86.3  --  85.3  --   --  85.3  --  84.7  --  85.3  PLT 96*  < > 107*  --  106* 105*  --  96*  --  96*  < > = values in this interval not displayed.  INR:  Recent Labs Lab 05/30/12 1218 06/02/12 1500 06/03/12 0115  INR 1.05 1.50* 1.40    Other results:  EKG:   Imaging: Dg Chest Port 1 View  06/03/2012  *RADIOLOGY REPORT*  Clinical Data: Chest tube insertion  PORTABLE CHEST - 1 VIEW  Comparison: 06/03/2012  Findings: Right-sided chest tube is in place.  There is been resolution of the large right sided pneumothorax.  Left chest tube is stable in position without left-sided pneumothorax.  The ET tube tip is above the carina.   There is a right IJ catheter with tip in the cavoatrial junction.  Left chest wall AICD is noted with leads in the right atrial appendage right ventricle.  There is a left IJ catheter with tip in the main pulmonary artery.  Stable cardiac enlargement.  No pleural effusion or edema.  IMPRESSION:  1.  Cardiac enlargement. No pleural effusion or edema noted.  2.  Bilateral chest tubes in place without pneumothorax.   Original Report Authenticated By: Signa Kell, M.D.    Dg Chest Portable 1 View - Pod 1   06/03/2012  *RADIOLOGY REPORT*  Clinical Data: Post ventricular assist device placement  PORTABLE CHEST - 1 VIEW  Comparison: 06/02/2012; 06/01/2012; 05/12/2012; chest CT of 02/26 2014  Findings:  Grossly unchanged enlarged cardiac silhouette and mediastinal contours post median sternotomy.  Stable positioning of support apparatus.  Interval increase in now moderate to large-sized right- sided pneumothorax.  Query minimal leftward shift of the mediastinal structures and inferior displacement of the right hemidiaphragm.  Grossly unchanged cephalization of flow within the left upper lobe.  Unchanged left basilar/retrocardiac opacities. Query trace left-sided effusion.  Unchanged bones.  IMPRESSION: 1.  Interval increase in moderate to large-sized right-sided pneumothorax with possible mild/developing tension physiology. 3.  Stable position of support apparatus.  Above findings discussed with Tresa Res, RN, at [phone].   Original Report Authenticated By: Tacey Ruiz, MD    Dg Chest Port 1 View  06/02/2012  *RADIOLOGY REPORT*  Clinical Data: Status post VATS, evaluate for pneumothorax  PORTABLE CHEST - 1 VIEW  Comparison: 06/30/2012 at 1456 hours  Findings: Endotracheal tube at the thoracic inlet.  Stable right subclavian venous catheter, left IJ Swan-Ganz catheter, and enteric tube.  Stable left apical chest tube.  No left pneumothorax.  Small right pneumothorax, new.  Cardiomegaly.  Left subclavian ICD.  LVAD.   IMPRESSION: Small right pneumothorax, new.  Stable left apical chest tube.  No left pneumothorax.  Endotracheal tube at the thoracic inlet.  Additional stable support apparatus as above.  These results will be called to the ordering clinician or representative by the Radiologist Assistant, and communication documented in the PACS Dashboard.   Original Report Authenticated By: Charline Bills, M.D.    Dg Chest Portable 1 View  06/02/2012  *RADIOLOGY REPORT*  Clinical Data: Postop LVAD  PORTABLE CHEST - 1 VIEW  Comparison: 06/01/2012  Findings: Endotracheal tube terminates 4 cm above the carina.  Left apical chest tube.  No pneumothorax is seen.  Pulmonary vascular congestion without frank interstitial edema.  Right subclavian venous catheter terminates at the cavoatrial junction.  Left IJ Swan-Ganz catheter with its tip in the right main pulmonary artery.  Intra-aortic balloon pump with its tip in the mid descending thoracic aorta.  Enteric tube terminates in the proximal stomach.  Cardiomegaly.  Left ventricular assist device.  Left subclavian ICD.  IMPRESSION: Endotracheal tube terminates 4 cm above the carina.  Left apical chest tube.  No pneumothorax is seen.  Additional support apparatus as above.   Original Report Authenticated By: Charline Bills, M.D.      Medications:     Scheduled Medications: . acetaminophen  1,000 mg Oral Q6H   Or  . acetaminophen (TYLENOL) oral liquid 160 mg/5 mL  975 mg Per Tube Q6H  . aspirin EC  325 mg Oral Daily   Or  . aspirin  324 mg Per Tube Daily   Or  . aspirin  300 mg Rectal Daily  . bisacodyl  10 mg Oral Daily   Or  . bisacodyl  10 mg Rectal Daily  . cefUROXime (ZINACEF)  IV  1.5 g Intravenous Q12H  . chlorhexidine  15 mL Mouth/Throat BID  . DOBUTamine  2.5-20 mcg/kg/min Intravenous Once  . docusate sodium  200 mg Oral Daily  . insulin aspart  0-24 Units Subcutaneous Q4H  . insulin regular  0-10 Units Intravenous TID WC  . metoCLOPramide (REGLAN)  injection  5 mg Intravenous Q6H  . [START ON 06/04/2012] pantoprazole  40 mg Oral Daily  . sodium chloride  3 mL Intravenous Q12H  . vancomycin  1,000 mg Intravenous Q12H    Infusions: . sodium chloride 20 mL/hr at 06/03/12 0700  . sodium chloride 20 mL/hr at 06/02/12 1430  . dexmedetomidine 0.7 mcg/kg/hr (06/03/12 1030)  . DOPamine 3 mcg/kg/min (06/03/12 0700)  . EPINEPHrine infusion    . insulin (NOVOLIN-R) infusion Stopped (06/02/12 2000)  . milrinone 0.3 mcg/kg/min (06/03/12 0700)  . nitroGLYCERIN Stopped (06/02/12 1800)  . nitroPRUSSide 0.141 mcg/kg/min (06/03/12 0730)  . phenylephrine (NEO-SYNEPHRINE) Adult infusion      PRN Medications: sodium chloride, albumin human, midazolam, morphine injection, ondansetron (ZOFRAN) IV, oxyCODONE, sodium chloride   Assessment:  1. 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 H/o VT 3. NICM 4. Pneumothorax s/p L chest tube  Plan/Discussion:    He is doing very well POD#1. Plan of care as per Dr. Donata Clay. Agree with weaning nitric and vent. Lasix for diuresis.   I do not understand why flows are reading "---" persistently. The flows are a calculated # and should not read --- if other parameters are OK. I suspect something is wrong with the algorithm. I changed speed on pump briefly but no change. I  have a call into [employer].    I reviewed the LVAD parameters from today, and compared the results to the patient's prior recorded data.  No programming changes were made.  The LVAD is functioning within specified parameters.  The patient performs LVAD self-test daily.  LVAD interrogation was negative for any significant power changes, alarms or PI events/speed drops.  LVAD equipment check completed and is in good working order.  Back-up equipment present.   LVAD education done on emergency procedures and precautions and reviewed exit site care.  Length of Stay: 4  Daniel Bensimhon 06/03/2012, 11:11 AM

## 2012-06-03 NOTE — Progress Notes (Signed)
HeartMate 2 Rounding Note  Subjective:   Postop day 1 implantation HeartMate 2 VAD -end-stage idiopathic cardiomyopathy, advanced heart failure nonresponsive to home inotropes requiring preop balloon pump  Postoperative coagulopathy with thrombocytopenia requiring transfusion of platelets, FFP and one pack cell. Chest tube output now less than 50 cc per hour  Postop right spontaneous pneumothorax requiring right chest tube placement early this a.m.  Stable hemodynamics on minimal inotropic support with cardiac index 2.4, co-ox 70% and normal CVP and PA pressures. Balloon pump has been removed without hematoma  1 PI event this a.m. when patient was turned to position for chest tube placement  Post chest tube placement chest x-ray shows complete resolution of pneumothorax. Patient still remains sedated on ventilator while nitric oxide is being weaned per protocol. Atrially paced at 90 per minute    LVAD INTERROGATION:  HeartMate II LVAD:  Flow---liters/min, speed 9200, power4.8, PI6.2.  Controller    Objective:    Vital Signs:   Temp:  [95.7 F (35.4 C)-98.4 F (36.9 C)] 97.2 F (36.2 C) (03/01 0845) Pulse Rate:  [41-99] 90 (03/01 0845) Resp:  [0-24] 12 (03/01 0845) BP: (80-117)/(73-83) 87/80 mmHg (03/01 0259) SpO2:  [97 %-100 %] 100 % (03/01 0845) Arterial Line BP: (79-136)/(63-90) 102/87 mmHg (03/01 0815) FiO2 (%):  [50 %] 50 % (03/01 0830) Weight:  [222 lb 0.1 oz (100.7 kg)] 222 lb 0.1 oz (100.7 kg) (03/01 0500) Last BM Date: 06/01/12 Mean arterial Pressure 88-92 mmHg  Intake/Output:   Intake/Output Summary (Last 24 hours) at 06/03/12 0855 Last data filed at 06/03/12 0800  Gross per 24 hour  Intake 9474.3 ml  Output   7495 ml  Net 1979.3 ml     Physical Exam: General:  Well appearing. No resp difficulty sedated on ventilator HEENT: normal Neck: supple. JVP . Carotids 2+ bilat; no bruits. No lymphadenopathy or thryomegaly appreciated. Extremities warm Cor:  Mechanical heart sounds with LVAD hum present. Right-sided heart sounds present Lungs: clear Abdomen: soft, nontender, nondistended. No hepatosplenomegaly. No bruits or masses. Good bowel sounds. Extremities: no cyanosis, clubbing, rash, edema Neuro: alert & orientedx3, cranial nerves grossly intact. moves all 4 extremities w/o difficulty. Affect pleasant  Telemetry: A paced at 90  Labs: Basic Metabolic Panel:  Recent Labs Lab 05/29/12 1243  06/01/12 1050 06/02/12 0400  06/02/12 1445 06/02/12 1455 06/02/12 2008 06/02/12 2019 06/03/12 0115 06/03/12 0121 06/03/12 0600  NA 135  < > 135 137  < > 142 141 141  --  140 139 142  K 3.4*  < > 4.2 4.4  < > 3.5 3.5 3.7  --  4.3 4.3 4.4  CL 96  < > 101 104  --  107 107 107  --  108 108 109  CO2 27  < > 25 26  --  26  --   --   --  24  --  25  GLUCOSE 123*  < > 111* 88  < > 102* 102* 115*  --  119* 117* 107*  BUN 17  < > 18 17  --  14 13 13   --  12 11 11   CREATININE 1.19  < > 1.19 1.27  --  1.04 1.00 1.00 1.04 0.95 1.10 0.95  CALCIUM 9.8  < > 8.8 8.5  --  8.7  --   --   --  8.7  --  8.7  MG 2.3  --   --   --   --  2.0  --   --  2.8* 2.5  --  2.5  PHOS  --   --   --   --   --   --   --   --   --  4.2  --   --   < > = values in this interval not displayed.  Liver Function Tests:  Recent Labs Lab 06/01/12 1050 06/03/12 0115  AST 12 56*  ALT 11 15  ALKPHOS 56  --   BILITOT 1.2 2.2*  PROT 5.9*  --   ALBUMIN 3.1*  --    No results found for this basename: LIPASE, AMYLASE,  in the last 168 hours No results found for this basename: AMMONIA,  in the last 168 hours  CBC:  Recent Labs Lab 06/02/12 0400  06/02/12 1445  06/02/12 1500 06/02/12 1844 06/02/12 2008 06/03/12 0115 06/03/12 0121 06/03/12 0600  WBC 8.7  --  10.1  --   --  8.5  --  8.3  --  9.1  NEUTROABS  --   --   --   --   --   --   --  7.0  --   --   HGB 11.5*  < > 9.4*  < >  --  8.5* 8.2* 9.2* 8.5* 8.9*  HCT 33.3*  < > 27.2*  < >  --  24.4* 24.0* 26.1* 25.0* 25.6*   MCV 86.3  --  85.3  --   --  85.3  --  84.7  --  85.3  PLT 96*  < > 107*  --  106* 105*  --  96*  --  96*  < > = values in this interval not displayed.  INR:  Recent Labs Lab 05/30/12 1218 06/02/12 1500 06/03/12 0115  INR 1.05 1.50* 1.40    Other results:  EKG:   Imaging: Dg Chest Port 1 View  06/03/2012  *RADIOLOGY REPORT*  Clinical Data: Chest tube insertion  PORTABLE CHEST - 1 VIEW  Comparison: 06/03/2012  Findings: Right-sided chest tube is in place.  There is been resolution of the large right sided pneumothorax.  Left chest tube is stable in position without left-sided pneumothorax.  The ET tube tip is above the carina.  There is a right IJ catheter with tip in the cavoatrial junction.  Left chest wall AICD is noted with leads in the right atrial appendage right ventricle.  There is a left IJ catheter with tip in the main pulmonary artery.  Stable cardiac enlargement.  No pleural effusion or edema.  IMPRESSION:  1.  Cardiac enlargement. No pleural effusion or edema noted.  2.  Bilateral chest tubes in place without pneumothorax.   Original Report Authenticated By: Signa Kell, M.D.    Dg Chest Portable 1 View - Pod 1   06/03/2012  *RADIOLOGY REPORT*  Clinical Data: Post ventricular assist device placement  PORTABLE CHEST - 1 VIEW  Comparison: 06/02/2012; 06/01/2012; 05/12/2012; chest CT of 02/26 2014  Findings:  Grossly unchanged enlarged cardiac silhouette and mediastinal contours post median sternotomy.  Stable positioning of support apparatus.  Interval increase in now moderate to large-sized right- sided pneumothorax.  Query minimal leftward shift of the mediastinal structures and inferior displacement of the right hemidiaphragm.  Grossly unchanged cephalization of flow within the left upper lobe.  Unchanged left basilar/retrocardiac opacities. Query trace left-sided effusion.  Unchanged bones.  IMPRESSION: 1.  Interval increase in moderate to large-sized right-sided pneumothorax  with possible mild/developing tension physiology. 3.  Stable position of support apparatus.  Above findings discussed with Tresa Res, RN, at [phone].   Original Report Authenticated By: Tacey Ruiz, MD    Dg Chest Port 1 View  06/02/2012  *RADIOLOGY REPORT*  Clinical Data: Status post VATS, evaluate for pneumothorax  PORTABLE CHEST - 1 VIEW  Comparison: 06/30/2012 at 1456 hours  Findings: Endotracheal tube at the thoracic inlet.  Stable right subclavian venous catheter, left IJ Swan-Ganz catheter, and enteric tube.  Stable left apical chest tube.  No left pneumothorax.  Small right pneumothorax, new.  Cardiomegaly.  Left subclavian ICD.  LVAD.  IMPRESSION: Small right pneumothorax, new.  Stable left apical chest tube.  No left pneumothorax.  Endotracheal tube at the thoracic inlet.  Additional stable support apparatus as above.  These results will be called to the ordering clinician or representative by the Radiologist Assistant, and communication documented in the PACS Dashboard.   Original Report Authenticated By: Charline Bills, M.D.    Dg Chest Portable 1 View  06/02/2012  *RADIOLOGY REPORT*  Clinical Data: Postop LVAD  PORTABLE CHEST - 1 VIEW  Comparison: 06/01/2012  Findings: Endotracheal tube terminates 4 cm above the carina.  Left apical chest tube.  No pneumothorax is seen.  Pulmonary vascular congestion without frank interstitial edema.  Right subclavian venous catheter terminates at the cavoatrial junction.  Left IJ Swan-Ganz catheter with its tip in the right main pulmonary artery.  Intra-aortic balloon pump with its tip in the mid descending thoracic aorta.  Enteric tube terminates in the proximal stomach.  Cardiomegaly.  Left ventricular assist device.  Left subclavian ICD.  IMPRESSION: Endotracheal tube terminates 4 cm above the carina.  Left apical chest tube.  No pneumothorax is seen.  Additional support apparatus as above.   Original Report Authenticated By: Charline Bills, M.D.    Dg Chest  Port 1 View  06/01/2012  *RADIOLOGY REPORT*  Clinical Data: Cardiac patient on balloon pump  PORTABLE CHEST - 1 VIEW  Comparison: Portable exam 0955 hours compared to 05/12/2012  Findings: Left subclavian AICD leads project over right atrium right ventricle. Right jugular Swan-Ganz catheter tip projects over right proximal descending interlobar pulmonary artery. Tip of intra-aortic balloon pump projects over superior aspect of aortic arch; recommend withdrawal approximately 3.5 cm for positioning of tip at the inferior margin of the aortic arch. Right arm PICC line, tip projects over SVC.  Enlargement of cardiac silhouette with pulmonary vascular congestion. Accentuation of interstitial markings in perihilar regions could reflect minimal edema. No segmental consolidation, significant pleural effusion or pneumothorax.  IMPRESSION: Tip of intra-aortic balloon pump projects over the superior aspect of the aortic arch, recommend withdrawal 3.5 cm to position tip at inferior margin of arch. Enlargement of cardiac silhouette with pulmonary vascular congestion and question minimal edema.  Critical Value/emergent results were called by telephone at the time of interpretation on 06/01/2012 at 1018 hours to [facility] RN on 2900, who verbally acknowledged these results.   Original Report Authenticated By: Ulyses Southward, M.D.       Medications:     Scheduled Medications: . acetaminophen  1,000 mg Oral Q6H   Or  . acetaminophen (TYLENOL) oral liquid 160 mg/5 mL  975 mg Per Tube Q6H  . aspirin EC  325 mg Oral Daily   Or  . aspirin  324 mg Per Tube Daily   Or  . aspirin  300 mg Rectal Daily  . bisacodyl  10 mg Oral Daily   Or  . bisacodyl  10 mg Rectal Daily  . cefUROXime (  ZINACEF)  IV  1.5 g Intravenous Q12H  . DOBUTamine  2.5-20 mcg/kg/min Intravenous Once  . docusate sodium  200 mg Oral Daily  . furosemide      . furosemide  20 mg Intravenous Once  . insulin aspart  0-24 Units Subcutaneous Q4H  . insulin  regular  0-10 Units Intravenous TID WC  . metoCLOPramide (REGLAN) injection  5 mg Intravenous Q6H  .  morphine injection  4 mg Intravenous Once  . [START ON 06/04/2012] pantoprazole  40 mg Oral Daily  . rifampin  600 mg Oral Once  . sodium chloride  3 mL Intravenous Q12H  . vancomycin  1,000 mg Intravenous Q12H     Infusions: . sodium chloride 20 mL/hr at 06/03/12 0700  . sodium chloride 20 mL/hr at 06/02/12 1430  . dexmedetomidine 0.7 mcg/kg/hr (06/03/12 0746)  . DOPamine 3 mcg/kg/min (06/03/12 0700)  . EPINEPHrine infusion    . insulin (NOVOLIN-R) infusion Stopped (06/02/12 2000)  . milrinone 0.3 mcg/kg/min (06/03/12 0700)  . nitroGLYCERIN Stopped (06/02/12 1800)  . nitroPRUSSide 0.141 mcg/kg/min (06/03/12 0730)  . phenylephrine (NEO-SYNEPHRINE) Adult infusion       PRN Medications:  sodium chloride, albumin human, midazolam, morphine injection, ondansetron (ZOFRAN) IV, oxyCODONE, sodium chloride   Assessment:  57 year old male with idiopathic cardiomyopathy on [facility] heart transplant list required VAD as a bridge to transplantation.  Then functioning well with significant reductionin PA pressures, normal CVP, normal co- ox and cardiac output by Swan measurements. Postop bleeding is improved and platelet count stabilized at 96,000   Plan/Discussion:   #1 wean nitric oxide then wean ventilator and extubate #2 Lasix diuresis for weight gain #3 hold heparin-Coumadin until bleeding improved, daily INR   I reviewed the LVAD parameters from today, and compared the results to the patient's prior recorded data.  No programming changes were made.  The LVAD is functioning within specified parameters.  The patient performs LVAD self-test daily.  LVAD interrogation was negative for any significant power changes, alarms or PI events/speed drops.  LVAD equipment check completed and is in good working order.  Back-up equipment present.   LVAD education done on emergency procedures and  precautions and reviewed exit site care.  Length of Stay: 4  VAN TRIGT III,PETER 06/03/2012, 8:55 AM

## 2012-06-03 NOTE — Op Note (Signed)
NAME:  Robert [name], Robert [name] NO.:  [id number]  MEDICAL RECORD NO.:  [id number]  LOCATION:  2309                         FACILITY:  [facility]  PHYSICIAN:  Kerin Perna, M.D.  DATE OF BIRTH:  [birth year]  DATE OF PROCEDURE:  06/02/2012 DATE OF DISCHARGE:                              OPERATIVE REPORT   PREOPERATIVE DIAGNOSIS:  Nonischemic cardiomyopathy with end-stage heart failure, requiring home inotropic support and preoperative intra-aortic balloon pump.  POSTOPERATIVE DIAGNOSIS:  Nonischemic cardiomyopathy with end-stage heart failure, requiring home inotropic support and preoperative intra- aortic balloon pump.  OPERATION:  Implantation of HeartMate II Left Ventricular Assist Device.  SURGEON:  Kerin Perna, M.D.  ASSISTANT:  Evelene Croon, M.D.  ANESTHESIA:  General by Guadalupe Maple, M.D.  INDICATIONS:  The patient is a 57 year old male with advanced class 4 heart failure from idiopathic cardiomyopathy followed carefully in the Heart Failure Clinic.  He previously had been evaluated by [facility] and placed on the active transplant list.  He was started on home milrinone through a PICC line, but continued to deteriorate with advanced symptoms of shortness of breath, exercise intolerance, and upper abdominal discomfort, and dizziness.  A right heart cath was performed, which showed him to be in low output with high filling pressures and he was admitted.  The situation was discussed with his transplant service at [facility] and it was recommended and agreed upon that he would be a VAD for a bridged transplantation.  While he was in the hospital, he further deteriorated and a balloon pump was placed 36 hours prior to surgery.  Prior to surgery, I reviewed the procedure of LVAD implantation with the patient.  I discussed the major issues of surgery including the use of general anesthesia and cardiopulmonary bypass, the location of the surgical  incision, and the expected postoperative hospital recovery.  He understood the risks of the operation include bleeding, blood transfusion requirement, possible RV failure requiring mechanical RVAD support, stroke, infection, and death.  After reviewing these issues, he demonstrated his understanding and agreed to proceed with surgery under what I felt was an informed consent.  FINDINGS: 1. Severe global LV dysfunction and dilatation with EF of 10% to 15%     on preoperative TEE, no AI, PR, or PFO. 2. Post pump good decompression of the LV with a pump speed set at     9000 rpm. 3. Intraoperative coagulopathy with platelet count of 60,000 requiring     blood product transfusion.  OPERATIVE PROCEDURE:  The patient was brought to the operating room and placed supine on the operating table.  He had a preop balloon pump previously placed in the CCU and secured to the right femoral artery. He was an induced for general anesthesia.  After placement of a new Swan- Ganz catheter and removal of his other preop IV sites.  A right subclavian triple-lumen was placed by the anesthesia team.  The patient was prepped and draped as a sterile field.  A transesophageal echo probe was placed by the anesthesiologist which documented the preoperative diagnosis of severe LV dysfunction, severe MR and fairly well preserved RV function.  A sternal incision was  made. The left side of the sternum was elevated and a preperitoneal pocket was created.  After taking down the left hemidiaphragm for pump placement. The sternal retractor was then placed and the pericardium was opened and suspended.  Heparin was administered for cardiopulmonary bypass and ACT was documented as being therapeutic.  Pursestrings were placed in the ascending aorta and the right atrium.  The patient was cannulated but on placed on bypass. The HeartMate II equipment was opened and prepared according to protocol.  The outflow graft was  measured from the ascending aorta to the pericardial border at the diaphragm.  A partial occluding clamp was placed on the ascending aorta.  An aortotomy was performed with the ends extended with a 4.5 mm punch.  The outflow graft was sewn end-to-side to the ascending aorta using running 4-0 Prolene.  Air was evacuated and a vascular clamp was placed on the outflow graft.  There was one site of bleeding from the superior aspect of the anastomosis which was repaired with an extra pledgeted 4-0 Prolene.  The graft was then moved to the side and attention was directed to the LV apex.  The heart was elevated on several lap pads.  The appropriate location for the inflow cannula was identified approximately 1.5 cm lateral to the LAD on the anterior surface of the LV apex and 11 blade was placed in the LV and over this, a Foley catheter with a balloon was placed and inflated.  Over the Foley catheter, the circular cutting knife for the LV apical core was placed and the epicardium was scored.  The Foley was removed and the LV apical clot was then excised with the scissors.  There were several trabeculations in the LV which were removed.  There was no clot in the LV and after careful inspection.  CO2 had been insufflated into the operative field.  Next, the sutures for the inflow cannula were placed circumferentially around the LV apex using 2-0 Ethibond with large pledgets.  Next the inflow cannula sleeve, silastic sleeve was placed, and all the sutures were tied.  They were checked to make sure there was no spacing for bleeding.  A thin layer of CoSeal was placed around the sutures.  With the head down and filling maneuvers to the heart, the HeartMate II pump inflow cannula was inserted into the sleeve and secured with the heavy Ethibond suture as well as a tie-band and heavy silk sutures above and below tie-band.  Next, the driveline was brought to the previously tunneled sites to the right  of the umbilicus and then below the right costal margin.  The pump was then placed into the pocket and a small bowel loop in the driveline was placed in the preperitoneal pocket.  A vent line was attached to the outflow end of the pump using a perfusion adapter connection.  Next, the outflow cap was removed from the pump and the pump was connected to the outflow graft again with volume left in the heart to remove any air.  The connection was hand tight and secured, and the graft was warranted to make sure there was no twist.  A clamp remained on the outflow graft.  The driveline was then connected to the controller so the pump could be activated.  The lungs re-expanded and the ventilator was resumed. Nitric oxide was started.  Low-dose inotropes were started by anesthesia.  Volume was partially left in the heart and the VAD was turned onto a RPM of  6000.  We reduced the flow to half flow pump and the clamp on the outflow graft was removed and a 20-gauge needle was inserted to remove any potential air.  The VAD speed was then increased to 8000 as the pump was reduced to quarter flow and then off.  Dopamine and epinephrine were used, but shortly the epinephrine was weaned off and milrinone was started due to high mean pressures.  The echo showed good placement of the apical cannula with decompression of the left ventricle.  PA pressures came down nicely to the normal range, and RV function by echo and direct visualization was excellent.  The pump speed was remained at 8600 until the protamine was administered and then increased to 9000 rpm.  Hemodynamics remained stable.  The platelet count returned at 62,000 and after the protamine was administered, 2 units platelets were administered.  The anastomosis of the ascending aorta and LV apex were checked and found to be hemostatic.  There was still diffuse coagulopathy.  FFP was administered.  The patient remained hemodynamically  stable.  Quite extensive period of time was taken to obtain surgical hemostasis. The anterior mediastinal Bard catheter, a left pleural chest tube, and a 24-French soft chest tube pocket drain was placed beneath the VAD and the preperitoneal pocket.  All these drains were brought out through separate incisions and secured to the skin.  The superior pericardial VAD was closed over the aorta and the outflow tract down to the bander leaf on the outflow cannula, which had been secured with Bender relief Guard and extra sutures.  Also the outflow elbow where the pump had been lowered with a heavy Ethibond suture to the preperitoneal fascia.  The sternal wires were then placed and the anterior mediastinum was irrigated with vancomycin irrigation.  The chest was closed without deterioration and hemodynamics.  The pectoralis fascia was closed with interrupted #1 Vicryl.  This layer was also irrigated with vancomycin irrigation.  The subcutaneous and skin layers were closed with running Vicryl.  The counter incision to the right of the umbilicus was closed in layers using Vicryl.  The driveline site was closed with interrupted 3-0 Vicryl for the subcutaneous layer and two 3-0 nylon sutures for the skin.  Sterile dressings were applied.  The chest tubes were connected to a Pleur-Evac drainage system, and the echo showed good RV function, good decompression of the LV without septal deviation, and the patient was transported the ICU on 9000 rpm flow.     Kerin Perna, M.D.     PV/MEDQ  D:  06/03/2012  T:  06/03/2012  Job:  [id number]

## 2012-06-03 NOTE — Progress Notes (Signed)
[facility] BRIEF SICU PROGRESS NOTE  1 Day Post-Op  S/P Procedure(s) (LRB): INSERTION OF IMPLANTABLE LEFT VENTRICULAR ASSIST DEVICE (N/A) INTRAOPERATIVE TRANSESOPHAGEAL ECHOCARDIOGRAM (N/A)   Stable day Extubated uneventfully AAI paced w/ stable MAP and LVAD flows  UOP fairly good  Plan: Continue present plan  OWEN,CLARENCE H 06/03/2012 6:48 PM

## 2012-06-03 NOTE — Procedures (Signed)
Extubation Procedure Note  Patient Details:   Name: Robert [name] DOB: [birth year] MRN: [id number]   Airway Documentation:  Airway 8 mm (Active)  Secured at (cm) 22 cm 06/03/2012  4:04 PM  Measured From Lips 06/03/2012  4:04 PM  Secured Location Right 06/03/2012  4:04 PM  Secured By [employer] 06/03/2012  4:04 PM  Tube Holder Repositioned Yes 06/03/2012  4:04 PM  Cuff Pressure (cm H2O) 26 cm H2O 06/03/2012  7:00 AM  Site Condition Dry 06/03/2012  4:04 PM    Evaluation  O2 sats: stable throughout Complications: No apparent complications Patient did tolerate procedure well. Bilateral Breath Sounds: Clear;Diminished Suctioning: Airway Yes Cuff leak positive nif-20 [id number] MD called for orders to extubate.  Robert [name] 06/03/2012, 5:43 PM

## 2012-06-03 NOTE — Discharge Summary (Signed)
Patient seen and examined with Hurman Horn, PA-C. We discussed all aspects of the encounter. I agree with the assessment and plan as stated above. He is stable for d/c. See my rounding note from same day for other details. He will need close f/u in the HF clinic.   Truman Hayward 10:38 PM

## 2012-06-03 NOTE — Plan of Care (Signed)
Problem: Phase II Progression Outcomes Goal: Patient extubated within - Outcome: Not Met (add Reason) Patient intubated until Nitric oxide can be weaned

## 2012-06-03 NOTE — Progress Notes (Signed)
Site 0

## 2012-06-03 NOTE — Progress Notes (Signed)
Site 0 

## 2012-06-03 NOTE — Brief Op Note (Signed)
05/30/2012 - 06/02/2012  9:03 AM  PATIENT:  Robert [name]  57 y.o. male  PRE-OPERATIVE DIAGNOSIS: Postop right pneumothorax  following VAD POST-OPERATIVE DIAGNOSIS: Same  PROCEDURE:  Placement right chest tube  SURGEON:  Surgeon(s) and Role:    * Kerin Perna, MD - Primary      PHYSICIAN ASSISTANT: 0  ASSISTANTS: none   ANESTHESIA:   local  EBL:  Total I/O In: 124.8 [I.V.:94.8; NG/GT:30] Out: 135 [Urine:25; Chest Tube:110]  BLOOD ADMINISTERED:none  DRAINS: 47F R chest tube  LOCAL MEDICATIONS USED:  LIDOCAINE  and Amount: 10 ml  SPECIMEN:  No Specimen  DISPOSITION OF SPECIMEN:  N/A  COUNTS:  YES  TOURNIQUET:  * No tourniquets in log *  DICTATION: .Dragon Dictation  PLAN OF CARE: Continue hospitalization  PATIENT DISPOSITION:  ICU - intubated and hemodynamically stable.   Delay start of Pharmacological VTE agent (>24hrs) due to surgical blood loss or risk of bleeding: yes

## 2012-06-03 NOTE — Op Note (Signed)
NAME:  MANVIR, PRABHU NO.:  [id number]  MEDICAL RECORD NO.:  [id number]  LOCATION:  2309                         FACILITY:  [facility]  PHYSICIAN:  Kerin Perna, M.D.  DATE OF BIRTH:  [birth year]  DATE OF PROCEDURE:  06/02/2012 DATE OF DISCHARGE:                              OPERATIVE REPORT   OPERATION:  Removal of intra-aortic balloon pump.  PREOPERATIVE DIAGNOSES:  Advanced heart failure, nonischemic cardiomyopathy, preoperative balloon pump prior to placement of left ventricular assist device.  POSTOPERATIVE DIAGNOSES:  Advanced heart failure, nonischemic cardiomyopathy, preoperative balloon pump prior to placement of left ventricular assist device.  SURGEON:  Kerin Perna, M.D.  ANESTHESIA:  None.  PROCEDURE:  The patient earlier in the day had undergone placement of a left ventricular assist device for advanced heart failure as a bridge to transplantation.  The continuous flow VAD was working properly and after his coagulation parameters were checked in the ICU, it was felt that removal of the balloon pump would help to optimize his VAD flow.  The right groin was prepped and draped and the suture was cut.  The balloon pump was placed on standby and the balloon pump cath was pulled down to the introducer sheath.  The sheath and balloon pump were then pulled together and direct pressure was placed on the femoral artery insertion site.  This was held for 45 minutes.  The Doppler signal was present in the distal right foot.  After being hand-held for 45 minutes, a dressing was applied and there was no evidence of hematoma.     Kerin Perna, M.D.     PV/MEDQ  D:  06/03/2012  T:  06/03/2012  Job:  [id number]

## 2012-06-03 NOTE — Brief Op Note (Signed)
05/30/2012 - 06/02/2012  8:44 AM  PATIENT:  Robert [name]  57 y.o. male  PRE-OPERATIVE DIAGNOSIS:  ischemic cardiomyopathy;        S/p VAD with preop IABP  POST-OPERATIVE DIAGNOSIS: same  PROCEDURE:  Removal of IABP in SICU  SURGEON:  Surgeon(s) and Role:    * Kerin Perna, MD - Primary  PHYSICIAN ASSISTANT: 0  ASSISTANTS:0}   ANESTHESIA:   none  EBL:  Total I/O In: 124.8 [I.V.:94.8; NG/GT:30] Out: 135 [Urine:25; Chest Tube:110]  BLOOD ADMINISTERED:none  DRAINS: none   LOCAL MEDICATIONS USED:  NONE  SPECIMEN:  No Specimen  DISPOSITION OF SPECIMEN:  N/A  COUNTS:  YES  TOURNIQUET:  * No tourniquets in log *  DICTATION: .Dragon Dictation  PLAN OF CARE: Admit to inpatient   PATIENT DISPOSITION:  ICU - intubated and hemodynamically stable.   Delay start of Pharmacological VTE agent (>24hrs) due to surgical blood loss or risk of bleeding: not applicable

## 2012-06-04 ENCOUNTER — Inpatient Hospital Stay (HOSPITAL_COMMUNITY): Payer: BC Managed Care – PPO

## 2012-06-04 LAB — CARBOXYHEMOGLOBIN
Carboxyhemoglobin: 1.6 % — ABNORMAL HIGH (ref 0.5–1.5)
Carboxyhemoglobin: 1.2 % (ref 0.5–1.5)
Methemoglobin: 1 % (ref 0.0–1.5)
Methemoglobin: 0.8 % (ref 0.0–1.5)
O2 Saturation: 68.1 %
O2 Saturation: 70.9 %
Total hemoglobin: 12.5 g/dL — ABNORMAL LOW (ref 13.5–18.0)
Total hemoglobin: 9 g/dL — ABNORMAL LOW (ref 13.5–18.0)

## 2012-06-04 LAB — BASIC METABOLIC PANEL
GFR calc Af Amer: >90 mL/min (ref >90)
GFR calc non Af Amer: >90 mL/min (ref >90)
Potassium: 3.9 mEq/L (ref 3.5–5.1)
Sodium: 134 mEq/L — ABNORMAL LOW (ref 135–145)

## 2012-06-04 LAB — CBC WITH DIFFERENTIAL/PLATELET
Basophils Relative: 0 % (ref 0–1)
Hemoglobin: 8.8 g/dL — ABNORMAL LOW (ref 13.0–17.0)
Lymphocytes Relative: 3 % — ABNORMAL LOW (ref 12–46)
Lymphs Abs: 0.4 10*3/uL — ABNORMAL LOW (ref 0.7–4.0)
Monocytes Relative: 11 % (ref 3–12)
Neutro Abs: 10.2 10*3/uL — ABNORMAL HIGH (ref 1.7–7.7)
Neutrophils Relative %: 86 % — ABNORMAL HIGH (ref 43–77)
RBC: 2.98 MIL/uL — ABNORMAL LOW (ref 4.22–5.81)
WBC: 11.8 10*3/uL — ABNORMAL HIGH (ref 4.0–10.5)

## 2012-06-04 LAB — LACTATE DEHYDROGENASE: LDH: 331 U/L — ABNORMAL HIGH (ref 94–250)

## 2012-06-04 LAB — POCT I-STAT 3, ART BLOOD GAS (G3+)
Patient temperature: 37.1
pH, Arterial: 7.399 (ref 7.350–7.450)

## 2012-06-04 LAB — GLUCOSE, CAPILLARY
Glucose-Capillary: 108 mg/dL — ABNORMAL HIGH (ref 70–99)
Glucose-Capillary: 124 mg/dL — ABNORMAL HIGH (ref 70–99)
Glucose-Capillary: 71 mg/dL (ref 70–99)
Glucose-Capillary: 85 mg/dL (ref 70–99)
Glucose-Capillary: 97 mg/dL (ref 70–99)
Glucose-Capillary: 97 mg/dL (ref 70–99)

## 2012-06-04 LAB — PROTIME-INR
INR: 1.65 — ABNORMAL HIGH (ref 0.00–1.49)
Prothrombin Time: 19 seconds — ABNORMAL HIGH (ref 11.6–15.2)

## 2012-06-04 LAB — MRSA CULTURE

## 2012-06-04 LAB — POCT I-STAT, CHEM 8
HCT: 25 % — ABNORMAL LOW (ref 39.0–52.0)
Hemoglobin: 8.5 g/dL — ABNORMAL LOW (ref 13.0–17.0)
Potassium: 4.3 mEq/L (ref 3.5–5.1)
Sodium: 140 mEq/L (ref 135–145)
TCO2: 29 mmol/L (ref 0–100)

## 2012-06-04 LAB — PHOSPHORUS: Phosphorus: 4.1 mg/dL (ref 2.3–4.6)

## 2012-06-04 MED ORDER — TRAMADOL HCL 50 MG PO TABS
100.0000 mg | ORAL_TABLET | Freq: Four times a day (QID) | ORAL | Status: DC | PRN
  Administered 2012-06-04: 50 mg via ORAL
  Administered 2012-06-04 – 2012-06-16 (×7): 100 mg via ORAL
  Filled 2012-06-04 (×2): qty 2
  Filled 2012-06-04: qty 1
  Filled 2012-06-04 (×5): qty 2

## 2012-06-04 MED ORDER — FE FUMARATE-B12-VIT C-FA-IFC PO CAPS
1.0000 | ORAL_CAPSULE | Freq: Three times a day (TID) | ORAL | Status: DC
  Administered 2012-06-04 – 2012-06-06 (×6): 1 via ORAL
  Filled 2012-06-04 (×8): qty 1

## 2012-06-04 MED ORDER — FUROSEMIDE 10 MG/ML IJ SOLN
40.0000 mg | Freq: Two times a day (BID) | INTRAMUSCULAR | Status: DC
  Administered 2012-06-04: 20 mg via INTRAVENOUS
  Filled 2012-06-04 (×2): qty 4

## 2012-06-04 MED ORDER — FUROSEMIDE 10 MG/ML IJ SOLN
20.0000 mg | Freq: Two times a day (BID) | INTRAMUSCULAR | Status: DC
  Administered 2012-06-04 (×2): 20 mg via INTRAVENOUS
  Filled 2012-06-04: qty 2

## 2012-06-04 MED ORDER — KETOROLAC TROMETHAMINE 30 MG/ML IJ SOLN
30.0000 mg | Freq: Four times a day (QID) | INTRAMUSCULAR | Status: AC | PRN
  Administered 2012-06-04 – 2012-06-06 (×4): 30 mg via INTRAVENOUS
  Filled 2012-06-04 (×4): qty 1

## 2012-06-04 MED ORDER — LACTATED RINGERS IV SOLN: INTRAVENOUS | Status: DC

## 2012-06-04 MED ORDER — WARFARIN SODIUM 2 MG PO TABS
2.0000 mg | ORAL_TABLET | Freq: Once | ORAL | Status: AC
  Administered 2012-06-04: 2 mg via ORAL
  Filled 2012-06-04: qty 1

## 2012-06-04 MED ORDER — FUROSEMIDE 10 MG/ML IJ SOLN
20.0000 mg | Freq: Four times a day (QID) | INTRAMUSCULAR | Status: DC
  Filled 2012-06-04: qty 2

## 2012-06-04 MED ORDER — WARFARIN SODIUM 2.5 MG PO TABS
2.5000 mg | ORAL_TABLET | Freq: Once | ORAL | Status: DC
  Filled 2012-06-04: qty 1

## 2012-06-04 MED ORDER — POTASSIUM CHLORIDE 10 MEQ/50ML IV SOLN
10.0000 meq | INTRAVENOUS | Status: AC
  Administered 2012-06-04 (×3): 10 meq via INTRAVENOUS
  Filled 2012-06-04: qty 150

## 2012-06-04 NOTE — Evaluation (Signed)
Physical Therapy Evaluation Patient Details Name: Robert [name] MRN: [id number] DOB: 1955/11/24 Today's Date: 06/04/2012 Time: 1191-4782 PT Time Calculation (min): 26 min  PT Assessment / Plan / Recommendation Clinical Impression  Pt s/p LVAD with decr mobility secondary to multiple lines and tubes as well as PTX after surgery.  Will benefit from PT to address endurance and balance issues.  Should progress well and has family support.  Did not unhook pt from wall power to battery today secondary to goal of transfer to chair only.      PT Assessment  Patient needs continued PT services    Follow Up Recommendations  Home health PT;Supervision/Assistance - 24 hour                Equipment Recommendations  Other (comment) (possibly a rollator)         Frequency Min 3X/week    Precautions / Restrictions Precautions Precautions: Fall;Sternal Precaution Comments: 4 chest tube sites with 2 chest tube pleuravacs, LVAD, external pacer Restrictions Weight Bearing Restrictions: No   Pertinent Vitals/Pain Flow 4.8, Speed 9180-9200, MAP 83-88; pain incr from 2-9/10 after transfer and nursing aware and addressing.        Mobility  Bed Mobility Bed Mobility: Rolling Right;Right Sidelying to Sit;Sitting - Scoot to [place] of Bed Rolling Right: 1: +2 Total assist Rolling Right: Patient Percentage: 60% Right Sidelying to Sit: 1: +2 Total assist Right Sidelying to Sit: Patient Percentage: 40% Sitting - Scoot to Edge of Bed: 4: Min assist Details for Bed Mobility Assistance: Assisted pt for elevation of trunk as pt with sternal precautions and multiple chest tubes.  Pt hugged pillow to chest. Transfers Transfers: Sit to Stand;Stand to Sit;Stand Pivot Transfers Sit to Stand: 1: +2 Total assist;From elevated surface;From bed;Without upper extremity assist Sit to Stand: Patient Percentage: 60% Stand to Sit: 1: +2 Total assist;Without upper extremity assist;To chair/3-in-1 Stand to Sit:  Patient Percentage: 60% Stand Pivot Transfers: 1: +2 Total assist Stand Pivot Transfers: Patient Percentage: 70% Details for Transfer Assistance: Pt needed assist to stand due to sternal precautions.  Once up just needed steadying assist to stand and pivot to the recliner.  Pt pivoted slowly with guidance at elbows while pt hugging pillow.   Ambulation/Gait Ambulation/Gait Assistance: Not tested (comment) Stairs: No Wheelchair Mobility Wheelchair Mobility: No         PT Diagnosis: Generalized weakness  PT Problem List: Decreased activity tolerance;Decreased balance;Decreased mobility;Decreased knowledge of precautions;Decreased safety awareness;Decreased knowledge of use of DME;Pain PT Treatment Interventions: DME instruction;Gait training;Functional mobility training;Therapeutic activities;Therapeutic exercise;Balance training;Patient/family education   PT Goals Acute Rehab PT Goals PT Goal Formulation: With patient Time For Goal Achievement: 06/18/12 Potential to Achieve Goals: Good Pt will go Supine/Side to Sit: Independently PT Goal: Supine/Side to Sit - Progress: Goal set today Pt will Sit at Edge of Bed: Independently;3-5 min;with no upper extremity support PT Goal: Sit at Edge Of Bed - Progress: Goal set today Pt will go Sit to Stand: with modified independence;without upper extremity assist PT Goal: Sit to Stand - Progress: Goal set today Pt will Ambulate: 51 - 150 feet;with modified independence;with least restrictive assistive device PT Goal: Ambulate - Progress: Goal set today Pt will Go Up / Down Stairs: 1-2 stairs;with least restrictive assistive device;with supervision PT Goal: Up/Down Stairs - Progress: Goal set today Additional Goals Additional Goal #1: Pt to follow sternal precautions with all mobility.  PT Goal: Additional Goal #1 - Progress: Goal set today Additional Goal #2: Pt  to manipulate LVAD equipment with independence. PT Goal: Additional Goal #2 -  Progress: Goal set today  Visit Information  Last PT Received On: 06/04/12 Assistance Needed: +2    Subjective Data  Subjective: "I am hurting," pt stated after gettting up to chair. Patient Stated Goal: To go home   Prior Functioning  Home Living Lives With: Alone Available Help at Discharge: Family;Available 24 hours/day (sister and mom) Type of Home: House Home Access: Stairs to enter [employer] of Steps: 2 Entrance Stairs-Rails: None Home Layout: Multi-level Alternate Level Stairs-Number of Steps: 7 Alternate Level Stairs-Rails: Left Bathroom Shower/Tub: Walk-in shower;Tub/shower unit [employer]: Standard Home Adaptive Equipment: Built-in shower seat Prior Function Level of Independence: Independent Able to Take Stairs?: Yes Driving: Yes Comments: up until one month ago emplyoyed Communication Communication: No difficulties Dominant Hand: Right    Cognition  Cognition Overall Cognitive Status: Appears within functional limits for tasks assessed/performed Arousal/Alertness: Awake/alert Orientation Level: Appears intact for tasks assessed Behavior During Session: [facility] for tasks performed    Extremity/Trunk Assessment Right Lower Extremity Assessment RLE ROM/Strength/Tone: [facility] for tasks assessed Left Lower Extremity Assessment LLE ROM/Strength/Tone: [facility] for tasks assessed Trunk Assessment Trunk Assessment: Normal   Balance Static Sitting Balance Static Sitting - Balance Support: No upper extremity supported;Feet supported Static Sitting - Level of Assistance: 5: Stand by assistance Static Sitting - Comment/# of Minutes: 2  End of Session PT - End of Session Equipment Utilized During Treatment: Gait belt;Oxygen Activity Tolerance: Patient limited by fatigue Patient left: in chair;with call bell/phone within reach;with nursing in room Nurse Communication: Mobility status       INGOLD,DAWN 06/04/2012, 10:38 AM [facility] [phone] (pager)

## 2012-06-04 NOTE — Progress Notes (Signed)
HeartMate 2 Rounding Note  Subjective:   Postop day 2 implantation HeartMate 2 VAD -end-stage idiopathic cardiomyopathy, advanced heart failure nonresponsive to home inotropes requiring preop balloon pump  Postoperative coagulopathy with thrombocytopenia requiring transfusion of platelets, FFP and one pack cell. Chest tube output now less than 50 cc per hour. Total chest tube output > 800cc last 24h- leave chest tubes  Postop right spontaneous pneumothorax requiring right chest tube placement early this a.m.  Stable hemodynamics on minimal inotropic support with cardiac index 2.4, co-ox65-70% and normal CVP and PA pressures. Balloon pump has been removed without hematoma  1 PI event overnight- pump flow now displayed on system monitor 4.5 l/min   Patient extubated , c/o pain over pump pocket site  Currently on low dose dopamine, mil. A-pacing 90/min    LVAD INTERROGATION:  HeartMate II LVAD:  Flow4.5iters/min, speed 9200, power5.0 PI   5.1    Objective:    Vital Signs:   Temp:  [97.2 F (36.2 C)-100.6 F (38.1 C)] 98.4 F (36.9 C) (03/02 0800) Pulse Rate:  [31-99] 91 (03/02 0645) Resp:  [12-37] 23 (03/02 0800) SpO2:  [87 %-100 %] 100 % (03/02 0500) Arterial Line BP: (71-104)/(67-92) 79/71 mmHg (03/02 0800) FiO2 (%):  [40 %-50 %] 40 % (03/01 1604) Weight:  [215 lb 13.3 oz (97.9 kg)] 215 lb 13.3 oz (97.9 kg) (03/02 0645) Last BM Date: 06/01/12 Mean arterial Pressure 75-80 mm Hg  Intake/Output:   Intake/Output Summary (Last 24 hours) at 06/04/12 0826 Last data filed at 06/04/12 0800  Gross per 24 hour  Intake 2703.37 ml  Output   4530 ml  Net -1826.63 ml     Physical Exam: General:  Well appearing. No resp difficulty sedated on ventilator HEENT: normal Neck: supple. JVP . Carotids 2+ bilat; no bruits. No lymphadenopathy or thryomegaly appreciated. Extremities warm Cor: Mechanical heart sounds with LVAD hum present. Right-sided heart sounds present Lungs:  clear Abdomen: soft, nontender, nondistended. No hepatosplenomegaly. No bruits or masses. Good bowel sounds. Extremities: no cyanosis, clubbing, rash, edema Neuro: alert & orientedx3, cranial nerves grossly intact. moves all 4 extremities w/o difficulty. Affect pleasant  Telemetry: A paced at 90  Labs: Basic Metabolic Panel:  Recent Labs Lab 06/02/12 0400  06/02/12 1445  06/02/12 2008 06/02/12 2019 06/03/12 0115 06/03/12 0121 06/03/12 0600 06/03/12 1134 06/03/12 1649 06/03/12 1650 06/04/12 0300  NA 137  < > 142  < > 141  --  140 139 142 142  --  142 134*  K 4.4  < > 3.5  < > 3.7  --  4.3 4.3 4.4 3.8  --  4.6 3.9  CL 104  --  107  < > 107  --  108 108 109 106  --  106 101  CO2 26  --  26  --   --   --  24  --  25  --   --   --  26  GLUCOSE 88  < > 102*  < > 115*  --  119* 117* 107* 127*  --  103* 115*  BUN 17  --  14  < > 13  --  12 11 11 11   --  11 11  CREATININE 1.27  --  1.04  < > 1.00 1.04 0.95 1.10 0.95 0.90 0.94 1.10 0.89  CALCIUM 8.5  --  8.7  --   --   --  8.7  --  8.7  --   --   --  8.6  MG  --   --  2.0  --   --  2.8* 2.5  --  2.5  --  2.3  --  2.2  PHOS  --   --   --   --   --   --  4.2  --   --   --   --   --  4.1  < > = values in this interval not displayed.  Liver Function Tests:  Recent Labs Lab 06/01/12 1050 06/03/12 0115  AST 12 56*  ALT 11 15  ALKPHOS 56  --   BILITOT 1.2 2.2*  PROT 5.9*  --   ALBUMIN 3.1*  --    No results found for this basename: LIPASE, AMYLASE,  in the last 168 hours No results found for this basename: AMMONIA,  in the last 168 hours  CBC:  Recent Labs Lab 06/03/12 0115  06/03/12 0600 06/03/12 1130 06/03/12 1134 06/03/12 1649 06/03/12 1650 06/04/12 0300  WBC 8.3  --  9.1 9.6  --  10.7*  --  11.8*  NEUTROABS 7.0  --   --   --   --   --   --  10.2*  HGB 9.2*  < > 8.9* 9.0* 8.5* 9.4* 9.2* 8.8*  HCT 26.1*  < > 25.6* 26.1* 25.0* 27.0* 27.0* 25.8*  MCV 84.7  --  85.3 85.0  --  85.7  --  86.6  PLT 96*  --  96* 93*  --   91*  --  82*  < > = values in this interval not displayed.  INR:  Recent Labs Lab 05/30/12 1218 06/02/12 1500 06/03/12 0115 06/04/12 0300  INR 1.05 1.50* 1.40 1.65*    Other results:  EKG: paced rhythm  Imaging: Dg Chest Port 1 View  06/04/2012  *RADIOLOGY REPORT*  Clinical Data: Post LVAD  PORTABLE CHEST - 1 VIEW  Comparison: 06/03/2012; 06/30/2012  Findings:  Grossly unchanged enlarged cardiac silhouette and mediastinal contours.  Stable positioning of support apparatus with suspected tiny / small left apical residual left apical pneumothorax.  Lung volumes are reduced with corresponding increase in bibasilar heterogeneous / consolidative opacities, left greater than right. Suspect mild pulmonary venous congestion.  Query trace bilateral effusions, left greater than right.  Unchanged bones.  IMPRESSION: 1.  Stable positioning of support apparatus with possible reaccumulation of a tiny / small left apical pneumothorax. 2.  Reduced lung volumes with worsening perihilar and bibasilar opacities favored to represent atelectasis. 3.  Suspected mild pulmonary venous congestion and trace bilateral effusions, left greater than right.   Original Report Authenticated By: Tacey Ruiz, MD    Dg Chest Port 1 View  06/03/2012  *RADIOLOGY REPORT*  Clinical Data: Chest tube insertion  PORTABLE CHEST - 1 VIEW  Comparison: 06/03/2012  Findings: Right-sided chest tube is in place.  There is been resolution of the large right sided pneumothorax.  Left chest tube is stable in position without left-sided pneumothorax.  The ET tube tip is above the carina.  There is a right IJ catheter with tip in the cavoatrial junction.  Left chest wall AICD is noted with leads in the right atrial appendage right ventricle.  There is a left IJ catheter with tip in the main pulmonary artery.  Stable cardiac enlargement.  No pleural effusion or edema.  IMPRESSION:  1.  Cardiac enlargement. No pleural effusion or edema noted.  2.   Bilateral chest tubes in place without pneumothorax.   Original  Report Authenticated By: Signa Kell, M.D.    Dg Chest Portable 1 View - Pod 1   06/03/2012  *RADIOLOGY REPORT*  Clinical Data: Post ventricular assist device placement  PORTABLE CHEST - 1 VIEW  Comparison: 06/02/2012; 06/01/2012; 05/12/2012; chest CT of 02/26 2014  Findings:  Grossly unchanged enlarged cardiac silhouette and mediastinal contours post median sternotomy.  Stable positioning of support apparatus.  Interval increase in now moderate to large-sized right- sided pneumothorax.  Query minimal leftward shift of the mediastinal structures and inferior displacement of the right hemidiaphragm.  Grossly unchanged cephalization of flow within the left upper lobe.  Unchanged left basilar/retrocardiac opacities. Query trace left-sided effusion.  Unchanged bones.  IMPRESSION: 1.  Interval increase in moderate to large-sized right-sided pneumothorax with possible mild/developing tension physiology. 3.  Stable position of support apparatus.  Above findings discussed with Tresa Res, RN, at [phone].   Original Report Authenticated By: Tacey Ruiz, MD    Dg Chest Port 1 View  06/02/2012  *RADIOLOGY REPORT*  Clinical Data: Status post VATS, evaluate for pneumothorax  PORTABLE CHEST - 1 VIEW  Comparison: 06/30/2012 at 1456 hours  Findings: Endotracheal tube at the thoracic inlet.  Stable right subclavian venous catheter, left IJ Swan-Ganz catheter, and enteric tube.  Stable left apical chest tube.  No left pneumothorax.  Small right pneumothorax, new.  Cardiomegaly.  Left subclavian ICD.  LVAD.  IMPRESSION: Small right pneumothorax, new.  Stable left apical chest tube.  No left pneumothorax.  Endotracheal tube at the thoracic inlet.  Additional stable support apparatus as above.  These results will be called to the ordering clinician or representative by the Radiologist Assistant, and communication documented in the PACS Dashboard.   Original Report  Authenticated By: Charline Bills, M.D.    Dg Chest Portable 1 View  06/02/2012  *RADIOLOGY REPORT*  Clinical Data: Postop LVAD  PORTABLE CHEST - 1 VIEW  Comparison: 06/01/2012  Findings: Endotracheal tube terminates 4 cm above the carina.  Left apical chest tube.  No pneumothorax is seen.  Pulmonary vascular congestion without frank interstitial edema.  Right subclavian venous catheter terminates at the cavoatrial junction.  Left IJ Swan-Ganz catheter with its tip in the right main pulmonary artery.  Intra-aortic balloon pump with its tip in the mid descending thoracic aorta.  Enteric tube terminates in the proximal stomach.  Cardiomegaly.  Left ventricular assist device.  Left subclavian ICD.  IMPRESSION: Endotracheal tube terminates 4 cm above the carina.  Left apical chest tube.  No pneumothorax is seen.  Additional support apparatus as above.   Original Report Authenticated By: Charline Bills, M.D.      Medications:     Scheduled Medications: . acetaminophen  1,000 mg Oral Q6H   Or  . acetaminophen (TYLENOL) oral liquid 160 mg/5 mL  975 mg Per Tube Q6H  . aspirin EC  325 mg Oral Daily   Or  . aspirin  324 mg Per Tube Daily   Or  . aspirin  300 mg Rectal Daily  . bisacodyl  10 mg Oral Daily   Or  . bisacodyl  10 mg Rectal Daily  . cefUROXime (ZINACEF)  IV  1.5 g Intravenous Q12H  . chlorhexidine  15 mL Mouth/Throat BID  . docusate sodium  200 mg Oral Daily  . furosemide  20 mg Intravenous BID  . insulin aspart  0-24 Units Subcutaneous Q4H  . insulin regular  0-10 Units Intravenous TID WC  . pantoprazole  40 mg Oral Daily  .  potassium chloride  10 mEq Intravenous Q1 Hr x 3  . sodium chloride  3 mL Intravenous Q12H  . warfarin  2.5 mg Oral ONCE-1800  . Warfarin - Physician Dosing Inpatient   Does not apply q1800    Infusions: . sodium chloride 20 mL (06/03/12 1215)  . DOPamine 3 mcg/kg/min (06/03/12 0700)  . insulin (NOVOLIN-R) infusion Stopped (06/02/12 2000)  . milrinone  0.3 mcg/kg/min (06/04/12 0756)  . phenylephrine (NEO-SYNEPHRINE) Adult infusion      PRN Medications: sodium chloride, midazolam, morphine injection, ondansetron (ZOFRAN) IV, oxyCODONE, sodium chloride, traMADol   Assessment:  57 year old male with idiopathic cardiomyopathy on [facility] heart transplant list required VAD as a bridge to transplantation. POD #2  VAD functioning well with significant reductionin PA pressures, normal CVP, normal co- ox and cardiac output by Swan measurements. Postop bleeding is improved,, and platelet count reduced 82k from 96k- HIT ordered   Plan/Discussion:   1 DC Swan, OOB to chair 2 coumadin 2.5 mg-,hold heparin for persistent chest tube output 3 diuresis  BID lasix 4 Pain control -add iv toradol 4 doses  Pump low speed limit increased to 8600 rpm I reviewed the LVAD parameters from today, and compared the results to the patient's prior recorded data.  No programming changes were made.  The LVAD is functioning within specified parameters.  The patient performs LVAD self-test daily.  LVAD interrogation was negative for any significant power changes, alarms or PI events/speed drops.  LVAD equipment check completed and is in good working order.  Back-up equipment present.   LVAD education done on emergency procedures and precautions and reviewed exit site care.  Length of Stay: 5  VAN TRIGT III,Dannel Rafter 06/04/2012, 8:26 AM

## 2012-06-04 NOTE — Progress Notes (Signed)
HeartMate 2 Rounding Note  Subjective:     s/p HeartMate 2 VAD 2/28  Up in chair. Feels weak. Mildly nauseated. + pain. Flows improving. Good diuresis overnight.  MAPs 70-80s. Co-ox 68%   LVAD INTERROGATION:  HeartMate II LVAD:  Flow 4.2 liters/min, speed 9200, power4.5 , PI 5.8.    Objective:    Vital Signs:   Temp:  [97.2 F (36.2 C)-100.6 F (38.1 C)] 98.4 F (36.9 C) (03/02 0800) Pulse Rate:  [31-99] 96 (03/02 0854) Resp:  [12-37] 23 (03/02 1100) BP: (90)/(80) 90/80 mmHg (03/02 0854) SpO2:  [87 %-100 %] 100 % (03/02 1100) Arterial Line BP: (71-104)/(67-92) 77/68 mmHg (03/02 1100) FiO2 (%):  [40 %-50 %] 40 % (03/01 1604) Weight:  [97.9 kg (215 lb 13.3 oz)] 97.9 kg (215 lb 13.3 oz) (03/02 0645) Last BM Date: 06/01/12 Mean arterial Pressure 84-92 mmHg  Intake/Output:   Intake/Output Summary (Last 24 hours) at 06/04/12 1119 Last data filed at 06/04/12 1104  Gross per 24 hour  Intake 2606.87 ml  Output   3490 ml  Net -883.13 ml     Physical Exam: General:  Up in chair HEENT: normal Neck: supple. L swan in place Cor:  LVAD hum present. Right-sided heart sounds present Lungs: clear. + chest tube Abdomen: soft, nontender, nondistended. No hepatosplenomegaly. No bruits or masses. Good bowel sounds. Extremities: no cyanosis, clubbing, rash, no edema Neuro: alert & orientedx3, cranial nerves grossly intact. moves all 4 extremities w/o difficulty. Affect pleasant  Telemetry: A paced at 90  Labs: Basic Metabolic Panel:  Recent Labs Lab 06/02/12 0400  06/02/12 1445  06/02/12 2008 06/02/12 2019 06/03/12 0115 06/03/12 0121 06/03/12 0600 06/03/12 1134 06/03/12 1649 06/03/12 1650 06/04/12 0300  NA 137  < > 142  < > 141  --  140 139 142 142  --  142 134*  K 4.4  < > 3.5  < > 3.7  --  4.3 4.3 4.4 3.8  --  4.6 3.9  CL 104  --  107  < > 107  --  108 108 109 106  --  106 101  CO2 26  --  26  --   --   --  24  --  25  --   --   --  26  GLUCOSE 88  < > 102*  < >  115*  --  119* 117* 107* 127*  --  103* 115*  BUN 17  --  14  < > 13  --  12 11 11 11   --  11 11  CREATININE 1.27  --  1.04  < > 1.00 1.04 0.95 1.10 0.95 0.90 0.94 1.10 0.89  CALCIUM 8.5  --  8.7  --   --   --  8.7  --  8.7  --   --   --  8.6  MG  --   --  2.0  --   --  2.8* 2.5  --  2.5  --  2.3  --  2.2  PHOS  --   --   --   --   --   --  4.2  --   --   --   --   --  4.1  < > = values in this interval not displayed.  Liver Function Tests:  Recent Labs Lab 06/01/12 1050 06/03/12 0115  AST 12 56*  ALT 11 15  ALKPHOS 56  --   BILITOT 1.2 2.2*  PROT  5.9*  --   ALBUMIN 3.1*  --    No results found for this basename: LIPASE, AMYLASE,  in the last 168 hours No results found for this basename: AMMONIA,  in the last 168 hours  CBC:  Recent Labs Lab 06/03/12 0115  06/03/12 0600 06/03/12 1130 06/03/12 1134 06/03/12 1649 06/03/12 1650 06/04/12 0300  WBC 8.3  --  9.1 9.6  --  10.7*  --  11.8*  NEUTROABS 7.0  --   --   --   --   --   --  10.2*  HGB 9.2*  < > 8.9* 9.0* 8.5* 9.4* 9.2* 8.8*  HCT 26.1*  < > 25.6* 26.1* 25.0* 27.0* 27.0* 25.8*  MCV 84.7  --  85.3 85.0  --  85.7  --  86.6  PLT 96*  --  96* 93*  --  91*  --  82*  < > = values in this interval not displayed.  INR:  Recent Labs Lab 05/30/12 1218 06/02/12 1500 06/03/12 0115 06/04/12 0300  INR 1.05 1.50* 1.40 1.65*    Other results:    Imaging: Dg Chest Port 1 View  06/04/2012  *RADIOLOGY REPORT*  Clinical Data: Post LVAD  PORTABLE CHEST - 1 VIEW  Comparison: 06/03/2012; 06/30/2012  Findings:  Grossly unchanged enlarged cardiac silhouette and mediastinal contours.  Stable positioning of support apparatus with suspected tiny / small left apical residual left apical pneumothorax.  Lung volumes are reduced with corresponding increase in bibasilar heterogeneous / consolidative opacities, left greater than right. Suspect mild pulmonary venous congestion.  Query trace bilateral effusions, left greater than right.   Unchanged bones.  IMPRESSION: 1.  Stable positioning of support apparatus with possible reaccumulation of a tiny / small left apical pneumothorax. 2.  Reduced lung volumes with worsening perihilar and bibasilar opacities favored to represent atelectasis. 3.  Suspected mild pulmonary venous congestion and trace bilateral effusions, left greater than right.   Original Report Authenticated By: Tacey Ruiz, MD    Dg Chest Port 1 View  06/03/2012  *RADIOLOGY REPORT*  Clinical Data: Chest tube insertion  PORTABLE CHEST - 1 VIEW  Comparison: 06/03/2012  Findings: Right-sided chest tube is in place.  There is been resolution of the large right sided pneumothorax.  Left chest tube is stable in position without left-sided pneumothorax.  The ET tube tip is above the carina.  There is a right IJ catheter with tip in the cavoatrial junction.  Left chest wall AICD is noted with leads in the right atrial appendage right ventricle.  There is a left IJ catheter with tip in the main pulmonary artery.  Stable cardiac enlargement.  No pleural effusion or edema.  IMPRESSION:  1.  Cardiac enlargement. No pleural effusion or edema noted.  2.  Bilateral chest tubes in place without pneumothorax.   Original Report Authenticated By: Signa Kell, M.D.    Dg Chest Portable 1 View - Pod 1   06/03/2012  *RADIOLOGY REPORT*  Clinical Data: Post ventricular assist device placement  PORTABLE CHEST - 1 VIEW  Comparison: 06/02/2012; 06/01/2012; 05/12/2012; chest CT of 02/26 2014  Findings:  Grossly unchanged enlarged cardiac silhouette and mediastinal contours post median sternotomy.  Stable positioning of support apparatus.  Interval increase in now moderate to large-sized right- sided pneumothorax.  Query minimal leftward shift of the mediastinal structures and inferior displacement of the right hemidiaphragm.  Grossly unchanged cephalization of flow within the left upper lobe.  Unchanged left basilar/retrocardiac opacities. Query trace  left-sided effusion.  Unchanged bones.  IMPRESSION: 1.  Interval increase in moderate to large-sized right-sided pneumothorax with possible mild/developing tension physiology. 3.  Stable position of support apparatus.  Above findings discussed with Tresa Res, RN, at [phone].   Original Report Authenticated By: Tacey Ruiz, MD    Dg Chest Port 1 View  06/02/2012  *RADIOLOGY REPORT*  Clinical Data: Status post VATS, evaluate for pneumothorax  PORTABLE CHEST - 1 VIEW  Comparison: 06/30/2012 at 1456 hours  Findings: Endotracheal tube at the thoracic inlet.  Stable right subclavian venous catheter, left IJ Swan-Ganz catheter, and enteric tube.  Stable left apical chest tube.  No left pneumothorax.  Small right pneumothorax, new.  Cardiomegaly.  Left subclavian ICD.  LVAD.  IMPRESSION: Small right pneumothorax, new.  Stable left apical chest tube.  No left pneumothorax.  Endotracheal tube at the thoracic inlet.  Additional stable support apparatus as above.  These results will be called to the ordering clinician or representative by the Radiologist Assistant, and communication documented in the PACS Dashboard.   Original Report Authenticated By: Charline Bills, M.D.    Dg Chest Portable 1 View  06/02/2012  *RADIOLOGY REPORT*  Clinical Data: Postop LVAD  PORTABLE CHEST - 1 VIEW  Comparison: 06/01/2012  Findings: Endotracheal tube terminates 4 cm above the carina.  Left apical chest tube.  No pneumothorax is seen.  Pulmonary vascular congestion without frank interstitial edema.  Right subclavian venous catheter terminates at the cavoatrial junction.  Left IJ Swan-Ganz catheter with its tip in the right main pulmonary artery.  Intra-aortic balloon pump with its tip in the mid descending thoracic aorta.  Enteric tube terminates in the proximal stomach.  Cardiomegaly.  Left ventricular assist device.  Left subclavian ICD.  IMPRESSION: Endotracheal tube terminates 4 cm above the carina.  Left apical chest tube.  No  pneumothorax is seen.  Additional support apparatus as above.   Original Report Authenticated By: Charline Bills, M.D.      Medications:     Scheduled Medications: . acetaminophen  1,000 mg Oral Q6H   Or  . acetaminophen (TYLENOL) oral liquid 160 mg/5 mL  975 mg Per Tube Q6H  . aspirin EC  325 mg Oral Daily   Or  . aspirin  324 mg Per Tube Daily   Or  . aspirin  300 mg Rectal Daily  . bisacodyl  10 mg Oral Daily   Or  . bisacodyl  10 mg Rectal Daily  . docusate sodium  200 mg Oral Daily  . furosemide  20 mg Intravenous BID  . insulin aspart  0-24 Units Subcutaneous Q4H  . insulin regular  0-10 Units Intravenous TID WC  . pantoprazole  40 mg Oral Daily  . potassium chloride  10 mEq Intravenous Q1 Hr x 3  . sodium chloride  3 mL Intravenous Q12H  . warfarin  2 mg Oral ONCE-1800  . Warfarin - Physician Dosing Inpatient   Does not apply q1800    Infusions: . sodium chloride 20 mL (06/03/12 1215)  . DOPamine 3 mcg/kg/min (06/03/12 0700)  . insulin (NOVOLIN-R) infusion Stopped (06/02/12 2000)  . lactated ringers 20 mL/hr (06/04/12 0930)  . milrinone 0.3 mcg/kg/min (06/04/12 0756)  . phenylephrine (NEO-SYNEPHRINE) Adult infusion      PRN Medications: sodium chloride, ketorolac, midazolam, morphine injection, ondansetron (ZOFRAN) IV, oxyCODONE, sodium chloride, traMADol   Assessment:  1. 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 H/o VT 3. NICM 4. Pneumothorax s/p L chest tube  Plan/Discussion:    He is doing very well POD#2. Plan of care as per Dr. Donata Clay. Would go slowly with diuresis. Pump parameters ok.   I reviewed the LVAD parameters from today, and compared the results to the patient's prior recorded data.  No programming changes were made.  The LVAD is functioning within specified parameters.  The patient performs LVAD self-test daily.  LVAD interrogation was negative for any significant power changes, alarms or PI events/speed drops.  LVAD equipment check  completed and is in good working order.  Back-up equipment present.   LVAD education done on emergency procedures and precautions and reviewed exit site care.  Length of Stay: 5  Rohail Klees 06/04/2012, 11:19 AM

## 2012-06-05 ENCOUNTER — Inpatient Hospital Stay (HOSPITAL_COMMUNITY): Payer: BC Managed Care – PPO

## 2012-06-05 ENCOUNTER — Other Ambulatory Visit: Payer: Self-pay

## 2012-06-05 ENCOUNTER — Encounter (HOSPITAL_COMMUNITY): Payer: Self-pay | Admitting: Cardiothoracic Surgery

## 2012-06-05 DIAGNOSIS — I059 Rheumatic mitral valve disease, unspecified: Secondary | ICD-10-CM

## 2012-06-05 LAB — TYPE AND SCREEN
ABO/RH(D): O POS
Antibody Screen: NEGATIVE
Unit division: 0
Unit division: 0
Unit division: 0
Unit division: 0
Unit division: 0
Unit division: 0

## 2012-06-05 LAB — GLUCOSE, CAPILLARY
Glucose-Capillary: 101 mg/dL — ABNORMAL HIGH (ref 70–99)
Glucose-Capillary: 87 mg/dL (ref 70–99)
Glucose-Capillary: 95 mg/dL (ref 70–99)

## 2012-06-05 LAB — HEPATIC FUNCTION PANEL
ALT: 13 U/L (ref 0–53)
AST: 37 U/L (ref 0–37)
Albumin: 2.7 g/dL — ABNORMAL LOW (ref 3.5–5.2)
Alkaline Phosphatase: 48 U/L (ref 39–117)
Bilirubin, Direct: 0.3 mg/dL (ref 0.0–0.3)
Indirect Bilirubin: 0.9 mg/dL (ref 0.3–0.9)
Total Bilirubin: 1.2 mg/dL (ref 0.3–1.2)
Total Protein: 5.5 g/dL — ABNORMAL LOW (ref 6.0–8.3)

## 2012-06-05 LAB — CARBOXYHEMOGLOBIN: Total hemoglobin: 11.5 g/dL — ABNORMAL LOW (ref 13.5–18.0)

## 2012-06-05 LAB — BASIC METABOLIC PANEL
BUN: 15 mg/dL (ref 6–23)
GFR calc Af Amer: >90 mL/min (ref >90)
GFR calc non Af Amer: >90 mL/min (ref >90)
Potassium: 3.8 mEq/L (ref 3.5–5.1)
Sodium: 137 mEq/L (ref 135–145)

## 2012-06-05 LAB — CBC WITH DIFFERENTIAL/PLATELET
Basophils Relative: 0 % (ref 0–1)
Hemoglobin: 8.6 g/dL — ABNORMAL LOW (ref 13.0–17.0)
Lymphocytes Relative: 5 % — ABNORMAL LOW (ref 12–46)
MCHC: 34.5 g/dL (ref 30.0–36.0)
Monocytes Relative: 9 % (ref 3–12)
Neutro Abs: 11.9 10*3/uL — ABNORMAL HIGH (ref 1.7–7.7)
Neutrophils Relative %: 85 % — ABNORMAL HIGH (ref 43–77)
RBC: 2.88 MIL/uL — ABNORMAL LOW (ref 4.22–5.81)
WBC: 14.1 10*3/uL — ABNORMAL HIGH (ref 4.0–10.5)

## 2012-06-05 LAB — PHOSPHORUS: Phosphorus: 4.2 mg/dL (ref 2.3–4.6)

## 2012-06-05 LAB — LACTATE DEHYDROGENASE: LDH: 378 U/L — ABNORMAL HIGH (ref 94–250)

## 2012-06-05 MED ORDER — WARFARIN SODIUM 5 MG PO TABS
5.0000 mg | ORAL_TABLET | Freq: Once | ORAL | Status: AC
  Administered 2012-06-05: 5 mg via ORAL
  Filled 2012-06-05: qty 1

## 2012-06-05 MED ORDER — PROMETHAZINE HCL 25 MG/ML IJ SOLN
12.5000 mg | Freq: Four times a day (QID) | INTRAMUSCULAR | Status: DC | PRN
  Administered 2012-06-05 – 2012-06-09 (×2): 12.5 mg via INTRAVENOUS
  Filled 2012-06-05 (×2): qty 1

## 2012-06-05 MED ORDER — METOCLOPRAMIDE HCL 5 MG/ML IJ SOLN
10.0000 mg | Freq: Four times a day (QID) | INTRAMUSCULAR | Status: DC
  Administered 2012-06-06 – 2012-06-08 (×10): 10 mg via INTRAVENOUS
  Filled 2012-06-05 (×16): qty 2

## 2012-06-05 MED ORDER — POTASSIUM CHLORIDE 10 MEQ/50ML IV SOLN
10.0000 meq | INTRAVENOUS | Status: AC
  Administered 2012-06-05 (×2): 10 meq via INTRAVENOUS
  Filled 2012-06-05: qty 100

## 2012-06-05 MED ORDER — METOCLOPRAMIDE HCL 5 MG/ML IJ SOLN
10.0000 mg | Freq: Four times a day (QID) | INTRAMUSCULAR | Status: DC | PRN
  Administered 2012-06-05: 10 mg via INTRAVENOUS
  Filled 2012-06-05: qty 2

## 2012-06-05 MED ORDER — FUROSEMIDE 10 MG/ML IJ SOLN
40.0000 mg | Freq: Two times a day (BID) | INTRAMUSCULAR | Status: DC
  Administered 2012-06-05 – 2012-06-07 (×6): 40 mg via INTRAVENOUS
  Filled 2012-06-05 (×6): qty 4

## 2012-06-05 NOTE — Op Note (Signed)
NAME:  Robert, [name] NO.:  [id number]  MEDICAL RECORD NO.:  [id number]  LOCATION:                                 FACILITY:  PHYSICIAN:  Kerin Perna, M.D.  DATE OF BIRTH:  22-Apr-1955  DATE OF PROCEDURE:  06/03/2012 DATE OF DISCHARGE:                              OPERATIVE REPORT   OPERATION:  Right chest tube placement.  PREOPERATIVE DIAGNOSIS:  Postop right pneumothorax following placement of left ventricular assist device.  POSTOPERATIVE DIAGNOSIS:  Postop right pneumothorax following placement of left ventricular assist device.  SURGEON:  Kerin Perna, MD.  ANESTHESIA:  Local 1% lidocaine.  CLINICAL NOTE:  The patient is a 57 year old gentleman who on the day previously had undergone placement of left ventricular assist device in his postoperative x-ray in the morning of day #1 showed a large right pneumothorax.  There was some tension pneumothorax involved.  Right chest tube placement was performed urgently.  A proper time-out was performed, and the right chest was prepped and draped as a sterile field per local 1% lidocaine was infiltrated in the fifth interspace.  An incision was made.  More lidocaine was infiltrated down to the interspace.  A hemostat was used to enter the right chest and immediate rush of air was noted.  A 28-French chest tube was then placed through the incision directly to the apex, secured to the skin with silk sutures and connected to underwater seal Pleur-Evac drainage system.  Postoperative chest x-ray showed good tube placement with resolution of the right pneumothorax.  A sterile dressing was placed and the Pleur-Evac was placed on suction.     Kerin Perna, M.D.     PV/MEDQ  D:  06/03/2012  T:  06/03/2012  Job:  [id number]

## 2012-06-05 NOTE — Progress Notes (Addendum)
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 PTA, pt independent with ADL and mobility. Pt worked until 1 month prior to surgery. Pt will benefit from skilled OT services to max independence with ADL and functional mobility for ADL to facilitate D/C home with supportive family and [facility] services. Making excellent progress. Began education on connecting/disconnecting to power source. Pt motivated to learn.     OT Assessment  Patient needs continued OT Services    Follow Up Recommendations  Home health OT    Barriers to Discharge None    Equipment Recommendations  3 in 1 bedside comode    Recommendations for Other Services  none  Frequency  Min 3X/week    Precautions / Restrictions Precautions Precautions: Fall;Sternal Precaution Comments: 4 chest tube sites with 2 chest tube pleuravacs, LVAD, external pacer   Pertinent Vitals/Pain Pain - 2. Vitals stable    ADL  Eating/Feeding: Set up Where Assessed - Eating/Feeding: Chair Grooming: Moderate assistance Where Assessed - Grooming: Supported sitting Upper Body Bathing: Maximal assistance Where Assessed - Upper Body Bathing: Supported sitting Lower Body Bathing: +1 Total assistance Where Assessed - Lower Body Bathing: Supported sit to stand Upper Body Dressing: Maximal assistance Where Assessed - Upper Body Dressing: Supported sitting Lower Body Dressing: +1 Total assistance Where Assessed - Lower Body Dressing: Supported sit to Pharmacist, hospital: +2 Total assistance Toilet Transfer: Patient Percentage: 80% Statistician Method: Sit to stand Equipment Used: Wheelchair;Other (comment) (LVAD equipiment) Transfers/Ambulation Related to ADLs: +2  for line/equipment mgnt. Sit - stand with min A. ambulated using w/c to hold equipment. Began education on mgnt of LVAD equipment. ADL Comments: limited by pain , precautions and weakness.     OT Diagnosis: Generalized weakness;Acute pain  OT Problem List: Decreased strength;Decreased range of motion;Decreased activity tolerance;Decreased knowledge of use of DME or AE;Decreased knowledge of precautions;Cardiopulmonary status limiting activity;Pain;Increased edema OT Treatment Interventions: Self-care/ADL training;Therapeutic exercise;Energy conservation;DME and/or AE instruction;Therapeutic activities;Patient/family education   OT Goals Acute Rehab OT Goals OT Goal Formulation: With patient Time For Goal Achievement: 06/19/12 Potential to Achieve Goals: Good ADL Goals Pt Will Perform Grooming: with supervision;Standing at sink;Unsupported ADL Goal: Grooming - Progress: Goal set today Pt Will Transfer to Toilet: with supervision;Ambulation;with DME;3-in-1 ADL Goal: Toilet Transfer - Progress: Goal set today Pt Will Perform Toileting - Clothing Manipulation: with supervision;Standing ADL Goal: Toileting - Clothing Manipulation - Progress: Goal set today Pt Will Perform Toileting - Hygiene: with supervision;Sit to stand from 3-in-1/toilet ADL Goal: Toileting - Hygiene - Progress: Goal set today Additional ADL Goal #1: Pt will demonstrate ability to connect/disconnect to battery pack units and appropriately secure in vest independently. ADL Goal: Additional Goal #1 - Progress: Goal set today Additional ADL Goal #2: Pt will donn/doff LVAD vest, using appropriate support for controller independently. ADL Goal: Additional Goal #2 - Progress: Goal set today Miscellaneous OT Goals Miscellaneous OT Goal #1: Pt will independently conduct self test on controller prior to session. OT Goal: Miscellaneous Goal #1 - Progress: Goal set today  Visit Information  Last OT Received On: 06/05/12 Assistance  Needed: +2 PT/OT Co-Evaluation/Treatment: Yes    Subjective Data      Prior Functioning     Home Living Lives With: Alone Available Help at  Discharge: Family;Available 24 hours/day (sister and mom) Type of Home: House Home Access: Stairs to enter [employer] of Steps: 2 Entrance Stairs-Rails: None Home Layout: Multi-level Alternate Level Stairs-Number of Steps: 7 Alternate Level Stairs-Rails: Left Bathroom Shower/Tub: Walk-in shower;Tub/shower unit [employer]: Standard Home Adaptive Equipment: Built-in shower seat Prior Function Level of Independence: Independent Able to Take Stairs?: Yes Driving: Yes Communication Communication: No difficulties Dominant Hand: Right         Vision/Perception Vision - History Baseline Vision: No visual deficits   Cognition  Cognition Overall Cognitive Status: Appears within functional limits for tasks assessed/performed Arousal/Alertness: Awake/alert Orientation Level: Appears intact for tasks assessed Behavior During Session: [facility] for tasks performed    Extremity/Trunk Assessment Right Upper Extremity Assessment RUE ROM/Strength/Tone: [facility] for tasks assessed Left Upper Extremity Assessment LUE ROM/Strength/Tone: Deficits LUE ROM/Strength/Tone Deficits: generalized weakness. mod edema Right Lower Extremity Assessment RLE ROM/Strength/Tone: [facility] for tasks assessed Left Lower Extremity Assessment LLE ROM/Strength/Tone: [facility] for tasks assessed Trunk Assessment Trunk Assessment: Normal     Mobility Bed Mobility Bed Mobility: Sit to Sidelying Left;Sit to Supine Sit to Sidelying Left: 1: +2 Total assist;HOB elevated Sit to Sidelying Left: Patient Percentage: 70% Details for Bed Mobility Assistance: assisted with lifting BLE back onto bed and stabilizing trunk Transfers Transfers: Sit to Stand;Stand to Sit Sit to Stand: 1: +2 Total assist;From chair/3-in-1 Sit to Stand: Patient Percentage: 80% Stand to Sit: 1:  +2 Total assist;To bed Stand to Sit: Patient Percentage: 80% Details for Transfer Assistance: vc for sternal precautions.      Exercise     Balance  S   End of Session OT - End of Session Activity Tolerance: Patient tolerated treatment well Patient left: in bed;with call bell/phone within reach;with nursing in room Nurse Communication: Mobility status;Precautions  GO     WARD,HILLARY 06/05/2012, 11:20 AM Luisa Dago, OTR/L  [phone] 06/05/2012

## 2012-06-05 NOTE — Progress Notes (Signed)
  Echocardiogram 2D Echocardiogram has been performed.  LITTLE, CHRISTY FRANCES 06/05/2012, 4:02 PM

## 2012-06-05 NOTE — Progress Notes (Signed)
HeartMate 2 Rounding Note  Subjective:    Mr. 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 + pocket pain and intermittent nausea.  Remains on low dose dopamine and milrinone. MAPs 80s.  Continues to diurese. Weight not on chart from this am.   Co-ox 62% CVP: 12-15 INR 1.39 (on coumadin now)  LVAD INTERROGATION:  HeartMate II LVAD:  Flow 4.5 liters/min, speed 9200, power 5.1, PI 6.6.  Still with some -- on flows.   Objective:    Vital Signs:   Temp:  [97.4 F (36.3 C)-98.4 F (36.9 C)] 97.4 F (36.3 C) (03/03 0734) Pulse Rate:  [45-96] 52 (03/03 0645) Resp:  [11-37] 17 (03/03 0700) BP: (90)/(80) 90/80 mmHg (03/02 0854) SpO2:  [97 %-100 %] 100 % (03/03 0645) Arterial Line BP: (65-95)/(61-84) 88/77 mmHg (03/03 0700) Last BM Date: 06/01/12 Mean arterial Pressure 80's  Intake/Output:   Intake/Output Summary (Last 24 hours) at 06/05/12 0750 Last data filed at 06/05/12 0700  Gross per 24 hour  Intake 2136.5 ml  Output   2380 ml  Net -243.5 ml     Physical Exam: General:  Sitting in chair No resp difficulty HEENT: normal Neck: supple. JVP up  Carotids 2+ bilat; no bruits. No lymphadenopathy or thryomegaly appreciated. Cor: Mechanical heart sounds with LVAD hum present. Lungs: clear, + Chest tubes Abdomen: soft, nontender, nondistended. No hepatosplenomegaly. No bruits or masses. Good bowel sounds. Extremities: no cyanosis, clubbing, rash, 1+ edema Neuro: alert & orientedx3, cranial nerves grossly intact. moves all 4 extremities w/o difficulty. Affect pleasant  Telemetry: A paced 90  Labs: Basic Metabolic Panel:  Recent Labs Lab 06/02/12 1445  06/02/12 2008  06/03/12 0115  06/03/12 0600 06/03/12 1134 06/03/12 1649 06/03/12 1650 06/04/12 0300 06/04/12 1521 06/05/12 0330  NA 142  < > 141  --  140  < > 142 142  --  142 134* 140 137  K 3.5  < > 3.7  --   4.3  < > 4.4 3.8  --  4.6 3.9 4.3 3.8  CL 107  < > 107  --  108  < > 109 106  --  106 101 103 104  CO2 26  --   --   --  24  --  25  --   --   --  26  --  28  GLUCOSE 102*  < > 115*  --  119*  < > 107* 127*  --  103* 115* 115* 102*  BUN 14  < > 13  --  12  < > 11 11  --  11 11 13 15   CREATININE 1.04  < > 1.00  < > 0.95  < > 0.95 0.90 0.94 1.10 0.89 0.90 0.89  CALCIUM 8.7  --   --   --  8.7  --  8.7  --   --   --  8.6  --  8.7  MG 2.0  --   --   < > 2.5  --  2.5  --  2.3  --  2.2  --  2.3  PHOS  --   --   --   --  4.2  --   --   --   --   --  4.1  --  4.2  < > = values in this interval not displayed.  Liver Function Tests:  Recent Labs Lab 06/01/12 1050 06/03/12 0115 06/05/12 0330  AST 12 56* 37  ALT 11 15 13   ALKPHOS 56  --  48  BILITOT 1.2 2.2* 1.2  PROT 5.9*  --  5.5*  ALBUMIN 3.1*  --  2.7*   No results found for this basename: LIPASE, AMYLASE,  in the last 168 hours No results found for this basename: AMMONIA,  in the last 168 hours  CBC:  Recent Labs Lab 06/03/12 0115  06/03/12 0600 06/03/12 1130  06/03/12 1649 06/03/12 1650 06/04/12 0300 06/04/12 1521 06/05/12 0330  WBC 8.3  --  9.1 9.6  --  10.7*  --  11.8*  --  14.1*  NEUTROABS 7.0  --   --   --   --   --   --  10.2*  --  11.9*  HGB 9.2*  < > 8.9* 9.0*  < > 9.4* 9.2* 8.8* 8.5* 8.6*  HCT 26.1*  < > 25.6* 26.1*  < > 27.0* 27.0* 25.8* 25.0* 24.9*  MCV 84.7  --  85.3 85.0  --  85.7  --  86.6  --  86.5  PLT 96*  --  96* 93*  --  91*  --  82*  --  100*  < > = values in this interval not displayed.  INR:  Recent Labs Lab 05/30/12 1218 06/02/12 1500 06/03/12 0115 06/04/12 0300 06/05/12 0330  INR 1.05 1.50* 1.40 1.65* 1.39    Other results:   Imaging: Dg Chest Port 1 View  06/04/2012  *RADIOLOGY REPORT*  Clinical Data: Post LVAD  PORTABLE CHEST - 1 VIEW  Comparison: 06/03/2012; 06/30/2012  Findings:  Grossly unchanged enlarged cardiac silhouette and mediastinal contours.  Stable positioning of support  apparatus with suspected tiny / small left apical residual left apical pneumothorax.  Lung volumes are reduced with corresponding increase in bibasilar heterogeneous / consolidative opacities, left greater than right. Suspect mild pulmonary venous congestion.  Query trace bilateral effusions, left greater than right.  Unchanged bones.  IMPRESSION: 1.  Stable positioning of support apparatus with possible reaccumulation of a tiny / small left apical pneumothorax. 2.  Reduced lung volumes with worsening perihilar and bibasilar opacities favored to represent atelectasis. 3.  Suspected mild pulmonary venous congestion and trace bilateral effusions, left greater than right.   Original Report Authenticated By: Tacey Ruiz, MD    Dg Chest Port 1 View  06/03/2012  *RADIOLOGY REPORT*  Clinical Data: Chest tube insertion  PORTABLE CHEST - 1 VIEW  Comparison: 06/03/2012  Findings: Right-sided chest tube is in place.  There is been resolution of the large right sided pneumothorax.  Left chest tube is stable in position without left-sided pneumothorax.  The ET tube tip is above the carina.  There is a right IJ catheter with tip in the cavoatrial junction.  Left chest wall AICD is noted with leads in the right atrial appendage right ventricle.  There is a left IJ catheter with tip in the main pulmonary artery.  Stable cardiac enlargement.  No pleural effusion or edema.  IMPRESSION:  1.  Cardiac enlargement. No pleural effusion or edema noted.  2.  Bilateral chest tubes in place without pneumothorax.   Original Report Authenticated By: Signa Kell, M.D.      Medications:     Scheduled Medications: . acetaminophen  1,000 mg Oral Q6H   Or  . acetaminophen (TYLENOL) oral liquid 160 mg/5 mL  975 mg Per Tube Q6H  .  aspirin EC  325 mg Oral Daily   Or  . aspirin  324 mg Per Tube Daily   Or  . aspirin  300 mg Rectal Daily  . bisacodyl  10 mg Oral Daily   Or  . bisacodyl  10 mg Rectal Daily  . docusate sodium  200  mg Oral Daily  . ferrous fumarate-b12-vitamic C-folic acid  1 capsule Oral TID PC  . insulin aspart  0-24 Units Subcutaneous Q4H  . insulin regular  0-10 Units Intravenous TID WC  . pantoprazole  40 mg Oral Daily  . potassium chloride  10 mEq Intravenous Q1 Hr x 2  . sodium chloride  3 mL Intravenous Q12H  . warfarin  5 mg Oral ONCE-1800  . Warfarin - Physician Dosing Inpatient   Does not apply q1800    Infusions: . sodium chloride 20 mL (06/03/12 1215)  . DOPamine 3 mcg/kg/min (06/04/12 1416)  . insulin (NOVOLIN-R) infusion Stopped (06/02/12 2000)  . milrinone 0.3 mcg/kg/min (06/04/12 2027)  . phenylephrine (NEO-SYNEPHRINE) Adult infusion      PRN Medications: sodium chloride, ketorolac, metoCLOPramide (REGLAN) injection, midazolam, morphine injection, ondansetron (ZOFRAN) IV, oxyCODONE, promethazine, sodium chloride, traMADol   Assessment:   1. 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 H/o VT  3. NICM  4. Pneumothorax s/p L chest tube   Plan/Discussion:    POD # 3.   Doing well post-op. Still volume overloaded. Agree with lasix.  Wean tubes and post-op inotropes as per Dr. Donata Clay. Continue coumadin. PT to see.  I reviewed the LVAD parameters from today, and compared the results to the patient's prior recorded data.  No programming changes were made.  The LVAD is functioning within specified parameters.  The staff is performing the LVAD self-test daily.  LVAD interrogation was negative for any significant power changes, alarms or PI events/speed drops. 1 PI event. LVAD equipment check completed and is in good working order.  Back-up equipment present.   LVAD education done on emergency procedures and precautions and reviewed exit site care.  Length of Stay: 6  Daniel Bensimhon 06/05/2012, 7:50 AM

## 2012-06-05 NOTE — Progress Notes (Signed)
Patient ID: Robert [name], male   DOB: 1956-04-02, 57 y.o.   MRN: [id number]  SICU Evening Rounds  Hemodynamically stable on Milrinone 0.3 and Dop 3.  Echo reviewed this evening with team and showed LV cavity still large with AV opening 2 out of 3 beats. Decision to increase speed to 9400.  Flow now 4.6, PI 6.2 and power 5.3.  Urine output ok  Nauseous today and Reglan continued, prn Zofran and Phenergan. Feels ok right now.

## 2012-06-05 NOTE — Progress Notes (Addendum)
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 Cath 2011 showed normal coronaries. He also has a history of VTach.   He is followed very closely in Heart Failure Clinic and In December was referred to the Transplant Clinic at [facility] as his symptoms began to worsen, we were unable to titrate meds due to hypotension and frequent NSVT. He underwent transplant evaluation, RHC showed low output with high filling pressures. He was diuresed and placed on home inotropes. He is on the transplant list and was having worsening HF symptoms when he was admitted to the hospital for bridge VAD on 05/30/12/  LVAD POD #3 Patient reports he is doing ok other than pocket pain and nausea. He remains on low dose dopamine and milrinone. MAPs 80s. 290 cc out of CT since last night. Weight stable.   CVP: 15 INR 1.39  LVAD INTERROGATION:  HeartMate II LVAD:  Flow 4.5 liters/min, speed 9200, power 5.1, PI 6.6.    Objective:    Vital Signs:   Temp:  [97.6 F (36.4 C)-98.4 F (36.9 C)] 97.9 F (36.6 C) (03/03 0345) Pulse Rate:  [45-96] 52 (03/03 0645) Resp:  [11-37] 17 (03/03 0700) BP: (90)/(80) 90/80 mmHg (03/02 0854) SpO2:  [97 %-100 %] 100 % (03/03 0645) Arterial Line BP: (65-95)/(61-84) 88/77 mmHg (03/03 0700) Last BM Date: 06/01/12 Mean arterial Pressure 80's  Intake/Output:   Intake/Output Summary (Last 24 hours) at 06/05/12 0718 Last data filed at 06/05/12 0600  Gross per 24 hour  Intake 2102.7 ml  Output   2320 ml  Net -217.3 ml     Physical Exam: General:  Laying in bed. No resp difficulty HEENT: normal Neck: supple. JVP 8-9 . Carotids 2+ bilat; no bruits. No lymphadenopathy or thryomegaly appreciated. Cor: Mechanical heart sounds with LVAD hum present. Lungs: clear, + Chest tubes Abdomen: soft,  nontender, nondistended. No hepatosplenomegaly. No bruits or masses. + bowel sounds. Securement device intact and dressing C/D/I Extremities: no cyanosis, clubbing, rash, trace edema Neuro: alert & orientedx3, cranial nerves grossly intact. moves all 4 extremities w/o difficulty. Affect pleasant  Telemetry: A paced 90  Labs: Basic Metabolic Panel:  Recent Labs Lab 06/02/12 1445  06/02/12 2008  06/03/12 0115  06/03/12 0600 06/03/12 1134 06/03/12 1649 06/03/12 1650 06/04/12 0300 06/04/12 1521 06/05/12 0330  NA 142  < > 141  --  140  < > 142 142  --  142 134* 140 137  K 3.5  < > 3.7  --  4.3  < > 4.4 3.8  --  4.6 3.9 4.3 3.8  CL 107  < > 107  --  108  < > 109 106  --  106 101 103 104  CO2 26  --   --   --  24  --  25  --   --   --  26  --  28  GLUCOSE 102*  < > 115*  --  119*  < > 107* 127*  --  103* 115* 115* 102*  BUN 14  < > 13  --  12  < > 11 11  --  11 11 13 15   CREATININE 1.04  < > 1.00  < > 0.95  < > 0.95 0.90 0.94 1.10 0.89 0.90 0.89  CALCIUM 8.7  --   --   --  8.7  --  8.7  --   --   --  8.6  --  8.7  MG 2.0  --   --   < > 2.5  --  2.5  --  2.3  --  2.2  --  2.3  PHOS  --   --   --   --  4.2  --   --   --   --   --  4.1  --  4.2  < > = values in this interval not displayed.  Liver Function Tests:  Recent Labs Lab 06/01/12 1050 06/03/12 0115 06/05/12 0330  AST 12 56* 37  ALT 11 15 13   ALKPHOS 56  --  48  BILITOT 1.2 2.2* 1.2  PROT 5.9*  --  5.5*  ALBUMIN 3.1*  --  2.7*   No results found for this basename: LIPASE, AMYLASE,  in the last 168 hours No results found for this basename: AMMONIA,  in the last 168 hours  CBC:  Recent Labs Lab 06/03/12 0115  06/03/12 0600 06/03/12 1130  06/03/12 1649 06/03/12 1650 06/04/12 0300 06/04/12 1521 06/05/12 0330  WBC 8.3  --  9.1 9.6  --  10.7*  --  11.8*  --  14.1*  NEUTROABS 7.0  --   --   --   --   --   --  10.2*  --  11.9*  HGB 9.2*  < > 8.9* 9.0*  < > 9.4* 9.2* 8.8* 8.5* 8.6*  HCT 26.1*  < > 25.6* 26.1*  < >  27.0* 27.0* 25.8* 25.0* 24.9*  MCV 84.7  --  85.3 85.0  --  85.7  --  86.6  --  86.5  PLT 96*  --  96* 93*  --  91*  --  82*  --  100*  < > = values in this interval not displayed.  INR:  Recent Labs Lab 05/30/12 1218 06/02/12 1500 06/03/12 0115 06/04/12 0300 06/05/12 0330  INR 1.05 1.50* 1.40 1.65* 1.39    Other results:  EKG:   Imaging: Dg Chest Port 1 View  06/04/2012  *RADIOLOGY REPORT*  Clinical Data: Post LVAD  PORTABLE CHEST - 1 VIEW  Comparison: 06/03/2012; 06/30/2012  Findings:  Grossly unchanged enlarged cardiac silhouette and mediastinal contours.  Stable positioning of support apparatus with suspected tiny / small left apical residual left apical pneumothorax.  Lung volumes are reduced with corresponding increase in bibasilar heterogeneous / consolidative opacities, left greater than right. Suspect mild pulmonary venous congestion.  Query trace bilateral effusions, left greater than right.  Unchanged bones.  IMPRESSION: 1.  Stable positioning of support apparatus with possible reaccumulation of a tiny / small left apical pneumothorax. 2.  Reduced lung volumes with worsening perihilar and bibasilar opacities favored to represent atelectasis. 3.  Suspected mild pulmonary venous congestion and trace bilateral effusions, left greater than right.   Original Report Authenticated By: Tacey Ruiz, MD    Dg Chest Port 1 View  06/03/2012  *RADIOLOGY REPORT*  Clinical Data: Chest tube insertion  PORTABLE CHEST - 1 VIEW  Comparison: 06/03/2012  Findings: Right-sided chest tube is in place.  There is been resolution of the large right sided pneumothorax.  Left chest tube is stable in position without left-sided pneumothorax.  The ET tube tip is above the carina.  There is a right IJ catheter with tip in the cavoatrial junction.  Left chest wall AICD is noted with leads in the right atrial appendage  right ventricle.  There is a left IJ catheter with tip in the main pulmonary artery.  Stable  cardiac enlargement.  No pleural effusion or edema.  IMPRESSION:  1.  Cardiac enlargement. No pleural effusion or edema noted.  2.  Bilateral chest tubes in place without pneumothorax.   Original Report Authenticated By: Signa Kell, M.D.       Medications:     Scheduled Medications: . acetaminophen  1,000 mg Oral Q6H   Or  . acetaminophen (TYLENOL) oral liquid 160 mg/5 mL  975 mg Per Tube Q6H  . aspirin EC  325 mg Oral Daily   Or  . aspirin  324 mg Per Tube Daily   Or  . aspirin  300 mg Rectal Daily  . bisacodyl  10 mg Oral Daily   Or  . bisacodyl  10 mg Rectal Daily  . docusate sodium  200 mg Oral Daily  . ferrous fumarate-b12-vitamic C-folic acid  1 capsule Oral TID PC  . furosemide  40 mg Intravenous BID  . insulin aspart  0-24 Units Subcutaneous Q4H  . insulin regular  0-10 Units Intravenous TID WC  . pantoprazole  40 mg Oral Daily  . sodium chloride  3 mL Intravenous Q12H  . Warfarin - Physician Dosing Inpatient   Does not apply q1800     Infusions: . sodium chloride 20 mL (06/03/12 1215)  . DOPamine 3 mcg/kg/min (06/04/12 1416)  . insulin (NOVOLIN-R) infusion Stopped (06/02/12 2000)  . milrinone 0.3 mcg/kg/min (06/04/12 2027)  . phenylephrine (NEO-SYNEPHRINE) Adult infusion       PRN Medications:  sodium chloride, ketorolac, midazolam, morphine injection, ondansetron (ZOFRAN) IV, oxyCODONE, sodium chloride, traMADol   Assessment:   1. 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 H/o VT  3. NICM  4. Pneumothorax s/p L chest tube   Plan/Discussion:    POD # 3. Hemodynamically stable. Total 240 cc out of CT last night, will leave in CT and remove PT. OOB to chair today and try to start ambulating. INR dropped will boost dose tonight. Still up from baseline weight will continue IV diurectics. Encourage IS/flutter. Continues to have nausea will start reglan back.  I reviewed the LVAD parameters from today, and compared the results to the patient's prior  recorded data.  No programming changes were made.  The LVAD is functioning within specified parameters.  The staff is performing the LVAD self-test daily.  LVAD interrogation was negative for any significant power changes, alarms or PI events/speed drops. 1 PI event. LVAD equipment check completed and is in good working order.  Back-up equipment present.   LVAD education done on emergency procedures and precautions and reviewed exit site care.  Length of Stay: 6  [name] 06/05/2012, 7:18 AM

## 2012-06-05 NOTE — Progress Notes (Signed)
Physical Therapy Treatment Patient Details Name: Robert [name] MRN: [id number] DOB: 14-Jan-1956 Today's Date: 06/05/2012 Time: 1001-1050 PT Time Calculation (min): 49 min  PT Assessment / Plan / Recommendation Comments on Treatment Session  Pt s/p LVAD with decr mobility secondary to multiple lines and tubes and need for education re: equipment for ambulation.  Pt educated in manipulation of equipment today for first time after surgery.  Nursing and OT present with PT.  Pt making progress as expected at this point.      Follow Up Recommendations  Home health PT;Supervision/Assistance - 24 hour                 Equipment Recommendations  Other (comment) (possibly needs a rollator)        Frequency Min 5X/week (Mistakenly put frequency at 3x, changed to 5x week)   Plan Discharge plan remains appropriate;Frequency needs to be updated    Precautions / Restrictions Precautions Precautions: Sternal;Fall Precaution Comments: 4 chest tube sites with 2 chest tube pleuravacs, LVAD, external pacer Restrictions Weight Bearing Restrictions: No   Pertinent Vitals/Pain 4.3 flow, 9190 speed, HR stable with ectopy - nursing called Ulla Potash who ok'd ambulation even with ectopy; Some pain     Mobility  Bed Mobility Bed Mobility: Sit to Supine Rolling Right: Not tested (comment) Right Sidelying to Sit: Not tested (comment) Sitting - Scoot to Edge of Bed: Not tested (comment) Sit to Supine: 1: +2 Total assist;HOB flat Sit to Supine: Patient Percentage: 70% Sit to Sidelying Left: Not Tested (comment) Sit to Sidelying Left: Patient Percentage: 70% Details for Bed Mobility Assistance: Pt assisted back to bed after ambulation with assist lifting BLE back onto bed and stabilizing trunk.   Transfers Transfers: Sit to Stand;Stand to Sit;Stand Pivot Transfers Sit to Stand: 1: +2 Total assist;Without upper extremity assist;From chair/3-in-1 Sit to Stand: Patient Percentage: 80% Stand to Sit: 1:  +2 Total assist;Without upper extremity assist;To bed;To elevated surface Stand to Sit: Patient Percentage: 80% Details for Transfer Assistance: Pt needed cues for hand placement for sternal precautions.  Used pillow to hold onto.  Also used pad to asssist up from low chair.   Ambulation/Gait Ambulation/Gait Assistance: 1: +2 Total assist Ambulation/Gait: Patient Percentage: 80% Ambulation Distance (Feet): 125 Feet Assistive device: Other (Comment) (wheelchair for equipment) Ambulation/Gait Assistance Details: Pt ambulated with wheelchair for equipment.  Pt instructed in how to place system controller in holster that goes around his neck.  Then educated pt on how to don vest.  Then demonstrated how to connect to batteries and had pt disconnect from wall power and placed to battery.  Pt needs max cues at present possibly secodnary to meds and given that this was first attempt after surgery.  Once ambulation complete pt placed himself back on wall power wtith mod cues.   Gait Pattern: Step-through pattern;Decreased stride length;Wide base of support Gait velocity: decreased Stairs: No Wheelchair Mobility Wheelchair Mobility: No    PT Goals Acute Rehab PT Goals PT Goal: Supine/Side to Sit - Progress: Progressing toward goal PT Goal: Sit at Edge Of Bed - Progress: Progressing toward goal PT Goal: Sit to Stand - Progress: Progressing toward goal PT Goal: Ambulate - Progress: Progressing toward goal Additional Goals PT Goal: Additional Goal #1 - Progress: Progressing toward goal PT Goal: Additional Goal #2 - Progress: Progressing toward goal  Visit Information  Last PT Received On: 06/05/12 Assistance Needed: +2 PT/OT Co-Evaluation/Treatment: Yes    Subjective Data  Subjective: "I am just going  with it.":   Cognition  Cognition Overall Cognitive Status: Appears within functional limits for tasks assessed/performed Arousal/Alertness: Awake/alert Orientation Level: Appears intact for  tasks assessed Behavior During Session: [facility] for tasks performed    Balance  Static Sitting Balance Static Sitting - Balance Support: Feet supported;No upper extremity supported Static Sitting - Level of Assistance: 5: Stand by assistance Static Sitting - Comment/# of Minutes: 2 Static Standing Balance Static Standing - Balance Support: Bilateral upper extremity supported;During functional activity Static Standing - Level of Assistance: 5: Stand by assistance Static Standing - Comment/# of Minutes: 2 minutes with good postural stability in standing.   End of Session PT - End of Session Equipment Utilized During Treatment: Gait belt;Oxygen Activity Tolerance: Patient limited by fatigue Patient left: in bed;with call bell/phone within reach;with nursing in room Nurse Communication: Mobility status        INGOLD,DAWN 06/05/2012, 11:35 AM  [facility] [phone] (pager)

## 2012-06-06 ENCOUNTER — Inpatient Hospital Stay (HOSPITAL_COMMUNITY): Payer: BC Managed Care – PPO

## 2012-06-06 DIAGNOSIS — Z95818 Presence of other cardiac implants and grafts: Secondary | ICD-10-CM

## 2012-06-06 DIAGNOSIS — I428 Other cardiomyopathies: Secondary | ICD-10-CM

## 2012-06-06 DIAGNOSIS — I5023 Acute on chronic systolic (congestive) heart failure: Secondary | ICD-10-CM

## 2012-06-06 LAB — HEPARIN LEVEL (UNFRACTIONATED): Heparin Unfractionated: 0.16 IU/mL — ABNORMAL LOW (ref 0.30–0.70)

## 2012-06-06 LAB — POCT I-STAT, CHEM 8
BUN: 16 mg/dL (ref 6–23)
Chloride: 97 mEq/L (ref 96–112)
Creatinine, Ser: 1 mg/dL (ref 0.50–1.35)
Glucose, Bld: 126 mg/dL — ABNORMAL HIGH (ref 70–99)
Hemoglobin: 10.5 g/dL — ABNORMAL LOW (ref 13.0–17.0)
Potassium: 3.8 mEq/L (ref 3.5–5.1)

## 2012-06-06 LAB — GLUCOSE, CAPILLARY
Glucose-Capillary: 103 mg/dL — ABNORMAL HIGH (ref 70–99)
Glucose-Capillary: 88 mg/dL (ref 70–99)

## 2012-06-06 LAB — CBC WITH DIFFERENTIAL/PLATELET
Basophils Relative: 0 % (ref 0–1)
Eosinophils Absolute: 0.2 10*3/uL (ref 0.0–0.7)
Eosinophils Relative: 2 % (ref 0–5)
Lymphs Abs: 0.8 10*3/uL (ref 0.7–4.0)
MCH: 29.1 pg (ref 26.0–34.0)
MCHC: 33.6 g/dL (ref 30.0–36.0)
MCV: 86.6 fL (ref 78.0–100.0)
Neutrophils Relative %: 81 % — ABNORMAL HIGH (ref 43–77)
Platelets: 114 10*3/uL — ABNORMAL LOW (ref 150–400)
RDW: 13.6 % (ref 11.5–15.5)

## 2012-06-06 LAB — CARBOXYHEMOGLOBIN
Methemoglobin: 1.3 % (ref 0.0–1.5)
Total hemoglobin: 9.2 g/dL — ABNORMAL LOW (ref 13.5–18.0)

## 2012-06-06 LAB — BASIC METABOLIC PANEL
BUN: 15 mg/dL (ref 6–23)
CO2: 28 mEq/L (ref 19–32)
Calcium: 8.6 mg/dL (ref 8.4–10.5)
Creatinine, Ser: 0.87 mg/dL (ref 0.50–1.35)
GFR calc non Af Amer: >90 mL/min (ref >90)
Glucose, Bld: 87 mg/dL (ref 70–99)
Sodium: 137 mEq/L (ref 135–145)

## 2012-06-06 LAB — PHOSPHORUS: Phosphorus: 3.7 mg/dL (ref 2.3–4.6)

## 2012-06-06 LAB — MAGNESIUM: Magnesium: 2 mg/dL (ref 1.5–2.5)

## 2012-06-06 MED ORDER — POTASSIUM CHLORIDE 10 MEQ/50ML IV SOLN
10.0000 meq | INTRAVENOUS | Status: AC
  Administered 2012-06-06 (×2): 10 meq via INTRAVENOUS
  Filled 2012-06-06: qty 100

## 2012-06-06 MED ORDER — HYDROCODONE-ACETAMINOPHEN 5-325 MG PO TABS
1.0000 | ORAL_TABLET | ORAL | Status: DC | PRN
  Administered 2012-06-08 – 2012-06-09 (×2): 1 via ORAL
  Filled 2012-06-06 (×2): qty 1

## 2012-06-06 MED ORDER — POTASSIUM CHLORIDE 10 MEQ/50ML IV SOLN
10.0000 meq | INTRAVENOUS | Status: AC | PRN
  Administered 2012-06-06 (×3): 10 meq via INTRAVENOUS

## 2012-06-06 MED ORDER — WARFARIN SODIUM 5 MG PO TABS
5.0000 mg | ORAL_TABLET | Freq: Once | ORAL | Status: AC
  Administered 2012-06-06: 5 mg via ORAL
  Filled 2012-06-06: qty 1

## 2012-06-06 MED ORDER — POTASSIUM CHLORIDE 10 MEQ/50ML IV SOLN
10.0000 meq | INTRAVENOUS | Status: AC
  Administered 2012-06-06: 10 meq via INTRAVENOUS
  Filled 2012-06-06: qty 50

## 2012-06-06 MED ORDER — BISACODYL 10 MG RE SUPP
10.0000 mg | Freq: Every day | RECTAL | Status: DC | PRN
  Administered 2012-06-07: 10 mg via RECTAL
  Filled 2012-06-06: qty 1

## 2012-06-06 MED ORDER — WARFARIN - PHARMACIST DOSING INPATIENT
Freq: Every day | Status: DC
  Administered 2012-06-10 – 2012-06-11 (×2)

## 2012-06-06 MED ORDER — HEPARIN (PORCINE) IN NACL 100-0.45 UNIT/ML-% IJ SOLN
1600.0000 [IU]/h | INTRAMUSCULAR | Status: DC
  Filled 2012-06-06 (×2): qty 250

## 2012-06-06 MED ORDER — HEPARIN (PORCINE) IN NACL 100-0.45 UNIT/ML-% IJ SOLN
1200.0000 [IU]/h | INTRAMUSCULAR | Status: DC
  Administered 2012-06-06: 1400 [IU]/h via INTRAVENOUS
  Administered 2012-06-06: 1200 [IU]/h via INTRAVENOUS
  Filled 2012-06-06 (×2): qty 250

## 2012-06-06 MED ORDER — POTASSIUM CHLORIDE 10 MEQ/50ML IV SOLN
INTRAVENOUS | Status: AC
  Filled 2012-06-06: qty 150

## 2012-06-06 MED ORDER — FENTANYL CITRATE 0.05 MG/ML IJ SOLN
50.0000 ug | INTRAMUSCULAR | Status: DC | PRN
  Administered 2012-06-07 (×2): 50 ug via INTRAVENOUS
  Filled 2012-06-06 (×2): qty 2

## 2012-06-06 MED FILL — Potassium Chloride Inj 2 mEq/ML: INTRAVENOUS | Qty: 40 | Status: AC

## 2012-06-06 MED FILL — Magnesium Sulfate Inj 50%: INTRAMUSCULAR | Qty: 10 | Status: AC

## 2012-06-06 MED FILL — Sodium Chloride IV Soln 0.9%: INTRAVENOUS | Qty: 250 | Status: AC

## 2012-06-06 MED FILL — Dextrose Inj 5%: INTRAVENOUS | Qty: 250 | Status: AC

## 2012-06-06 MED FILL — Vasopressin Inj 20 Unit/ML: INTRAMUSCULAR | Qty: 3 | Status: AC

## 2012-06-06 MED FILL — Norepinephrine Bitartrate IV Soln 1 MG/ML (Base Equivalent): INTRAMUSCULAR | Qty: 8 | Status: AC

## 2012-06-06 NOTE — Progress Notes (Signed)
ANTICOAGULATION CONSULT NOTE - Initial Consult  Pharmacy Consult for Heparin Indication: LVAD  No Known Allergies  Patient Measurements: Height: 6\' 2"  (188 cm) Weight: 212 lb 15.4 oz (96.6 kg) IBW/kg (Calculated) : 82.2  Vital Signs: Temp: 98.3 F (36.8 C) (03/04 0400) Temp src: Oral (03/04 0400) Pulse Rate: 105 (03/04 0700)  Labs:  Recent Labs  06/04/12 0300 06/04/12 1521 06/05/12 0330 06/06/12 0425  HGB 8.8* 8.5* 8.6* 8.9*  HCT 25.8* 25.0* 24.9* 26.5*  PLT 82*  --  100* 114*  LABPROT 19.0*  --  16.7* 16.8*  INR 1.65*  --  1.39 1.40  CREATININE 0.89 0.90 0.89 0.87    Estimated Creatinine Clearance: 110.2 ml/min (by C-G formula based on Cr of 0.87).   Medical History: Past Medical History  Diagnosis Date  . AICD (automatic cardioverter/defibrillator) present   . Hypertension   . Congestive heart failure   . Pneumonia   . Bronchitis     Medications:  Prescriptions prior to admission  Medication Sig Dispense Refill  . aspirin 81 MG chewable tablet Chew 81 mg by mouth daily.      . digoxin (LANOXIN) 0.125 MG tablet Take 0.125 mg by mouth daily.      Marland Kitchen eplerenone (INSPRA) 50 MG tablet Take 0.5 tablets (25 mg total) by mouth daily.  15 tablet  3  . metoprolol succinate (TOPROL-XL) 25 MG 24 hr tablet Take 1 tablet (25 mg total) by mouth daily.  30 tablet  3  . sodium chloride 0.9 % SOLN with milrinone 1 MG/ML SOLN 200 mcg/mL Inject 0.25 mcg/kg/min into the vein continuous.      . torsemide (DEMADEX) Robert MG tablet Take 1 tablet (Robert mg total) by mouth 2 (two) times daily.  60 tablet  3    Assessment: 57 yo [name] admitted 05/30/2012 from home on milrinone with worsening HF symptoms. IABP placed in cath lab 2/26, receiving LVAD 2/28.  Events 3/4, Chest tube output trend down, INR remains sub therapeutic, CBC stable, no bleeding noted.  Anticoagulation: s/p LVAD, Post-op coagulopathy w/ low PLTC requiring transfusion of platelets, FFP & 1 unit PRBCs; H/H, platelets trend  up.  No bleeding noted.  To repeat 5 mg warfarin today and initiate heparin with no bolus.  Goal of Therapy:  INR 2-3 Heparin level 0.3-0.5 Monitor platelets by anticoagulation protocol: Yes   Plan:  Give NO bolus, as still with chest tube in place, POD#4 LVAD Start heparin infusion at 1200 units/hr Check anti-Xa level in 6 hours and daily while on heparin Continue to monitor H&H and platelets Warfarin 5 mg PO x 1 today Daily INR  Thank you for allowing pharmacy to be a part of this patients care team.  Lovenia Kim Pharm.D., BCPS Clinical Pharmacist 06/06/2012 8:00 AM Pager: [phone] Phone: [phone]

## 2012-06-06 NOTE — Progress Notes (Signed)
Physical Therapy Treatment Patient Details Name: Robert [name] MRN: [id number] DOB: [birth year] Today's Date: 06/06/2012 Time: 7829-5621 PT Time Calculation (min): 42 min  PT Assessment / Plan / Recommendation Comments on Treatment Session  Pt s/p LVAD.  Pt making steady progress and beginning to manage switch from batteries to wall power.    Follow Up Recommendations  Home health PT;Supervision/Assistance - 24 hour     Does the patient have the potential to tolerate intense rehabilitation     Barriers to Discharge        Equipment Recommendations  Other (comment) (rollator)    Recommendations for Other Services    Frequency Min 5X/week   Plan Discharge plan remains appropriate;Frequency remains appropriate    Precautions / Restrictions Precautions Precautions: Sternal;Fall Precaution Comments: LVAD, chest tubes   Pertinent Vitals/Pain Stable    Mobility  Bed Mobility Sit to Supine: 1: +2 Total assist Sit to Supine: Patient Percentage: 70% Details for Bed Mobility Assistance: Assist to bring trunk up due to limiting use of arms. Transfers Sit to Stand: 1: +2 Total assist;Without upper extremity assist;From bed Sit to Stand: Patient Percentage: 80% Stand to Sit: 1: +2 Total assist;Without upper extremity assist;To chair/3-in-1 Stand to Sit: Patient Percentage: 80% Details for Transfer Assistance: Verbal/tactile cues for hand placement Ambulation/Gait Ambulation/Gait Assistance: 4: Min assist (+1 for lines/tubes) Ambulation Distance (Feet): 160 Feet Assistive device: Other (Comment) (pushing w/c) Ambulation/Gait Assistance Details: Pt pushed w/c which carried equipment.  Verbal cues to stand more erect.  Pt able to take himself off batteries and back on wall power with supervision. Gait Pattern: Step-through pattern;Decreased stride length;Trunk flexed Gait velocity: decreased    Exercises     PT Diagnosis:    PT Problem List:   PT Treatment Interventions:     PT  Goals Acute Rehab PT Goals PT Goal: Supine/Side to Sit - Progress: Progressing toward goal PT Goal: Sit to Stand - Progress: Progressing toward goal PT Goal: Ambulate - Progress: Progressing toward goal Additional Goals PT Goal: Additional Goal #1 - Progress: Progressing toward goal PT Goal: Additional Goal #2 - Progress: Progressing toward goal  Visit Information  Last PT Received On: 06/06/12 Assistance Needed: +2    Subjective Data  Subjective: Pt reports he is still having abdominal discomfort.   Cognition  Cognition Overall Cognitive Status: Appears within functional limits for tasks assessed/performed Arousal/Alertness: Awake/alert Orientation Level: Appears intact for tasks assessed Behavior During Session: [facility] for tasks performed    Balance  Static Standing Balance Static Standing - Balance Support: Bilateral upper extremity supported Static Standing - Level of Assistance: 5: Stand by assistance  End of Session PT - End of Session Activity Tolerance: Patient tolerated treatment well Patient left: in chair;with call bell/phone within reach;with nursing in room Nurse Communication: Mobility status   GP     Genevieve Arbaugh 06/06/2012, 4:02 PM  [employer] PT [phone]

## 2012-06-06 NOTE — Progress Notes (Signed)
PT Cancellation Note  Patient Details Name: Robert [name] MRN: [id number] DOB: [birth year]   Cancelled Treatment:    Reason Eval/Treat Not Completed: Pain limiting ability to participate   INGOLD,Matty Vanroekel 06/06/2012, 9:43 AM [facility] [phone] (pager)

## 2012-06-06 NOTE — Progress Notes (Signed)
CSW met briefly with patient today. He was alert and was able to remember CSW's name.  He stated that he was having a great deal of pain today; nursing was at bedside and is aware.  CSW provided support and will continue to offer visits and support.  [name]. [name]  [phone]

## 2012-06-06 NOTE — Progress Notes (Addendum)
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 Cath 2011 showed normal coronaries. He also has a history of VTach.   He is followed very closely in Heart Failure Clinic and In December was referred to the Transplant Clinic at [facility] as his symptoms began to worsen, we were unable to titrate meds due to hypotension and frequent NSVT. He underwent transplant evaluation, RHC showed low output with high filling pressures. He was diuresed and placed on home inotropes. He is on the transplant list and was having worsening HF symptoms when he was admitted to the hospital for bridge VAD on 05/30/12/  LVAD POD #4 Stable overnight. MAPs 80-90s. Aline, and 2 CTs removed yesterday. Weight down 4 lbs. Speed increased last night to 9400 after ECHO.  Co-ox 72 this am. Still on low dose dopamine and milrinone.   CVP: 13 INR 1.4  LVAD INTERROGATION:  HeartMate II LVAD:  Flow --- liters/min, speed 9400, power 5.2, PI 6.8    Objective:    Vital Signs:   Temp:  [97.5 F (36.4 C)-98.3 F (36.8 C)] 98.1 F (36.7 C) (03/04 0808) Pulse Rate:  [41-105] 105 (03/04 0800) Resp:  [15-28] 28 (03/04 0800) SpO2:  [97 %-100 %] 100 % (03/04 0800) Weight:  [212 lb 15.4 oz (96.6 kg)] 212 lb 15.4 oz (96.6 kg) (03/04 0500) Last BM Date: 06/01/12 Mean arterial Pressure 80's  Intake/Output:   Intake/Output Summary (Last 24 hours) at 06/06/12 0927 Last data filed at 06/06/12 1610  Gross per 24 hour  Intake 1232.4 ml  Output   2585 ml  Net -1352.6 ml     Physical Exam: General: Sitting in chair; No resp difficulty HEENT: normal Neck: supple. JVP 8-9 . Carotids 2+ bilat; no bruits. No lymphadenopathy or thryomegaly appreciated. Cor: Mechanical heart sounds with LVAD hum present, with soft heart sounds Lungs: clear, diminshed in  the bases; + Chest tubes Abdomen: soft, tender, distended. No hepatosplenomegaly. No bruits or masses. + bowel sounds. Securement device intact and dressing C/D/I Extremities: no cyanosis, clubbing, rash, trace edema Neuro: alert & orientedx3, cranial nerves grossly intact. moves all 4 extremities w/o difficulty. Affect pleasant  Telemetry: ST 110s  Labs: Basic Metabolic Panel:  Recent Labs Lab 06/02/12 2008  06/03/12 0115  06/03/12 0600  06/03/12 1649 06/03/12 1650 06/04/12 0300 06/04/12 1521 06/05/12 0330 06/06/12 0425  NA 141  --  140  < > 142  < >  --  142 134* 140 137 137  K 3.7  --  4.3  < > 4.4  < >  --  4.6 3.9 4.3 3.8 3.6  CL 107  --  108  < > 109  < >  --  106 101 103 104 101  CO2  --   --  24  --  25  --   --   --  26  --  28 28  GLUCOSE 115*  --  119*  < > 107*  < >  --  103* 115* 115* 102* 87  BUN 13  --  12  < > 11  < >  --  11 11 13 15 15   CREATININE 1.00  < > 0.95  < > 0.95  < > 0.94 1.10 0.89 0.90 0.89 0.87  CALCIUM  --   --  8.7  --  8.7  --   --   --  8.6  --  8.7 8.6  MG  --   < > 2.5  --  2.5  --  2.3  --  2.2  --  2.3 2.0  PHOS  --   --  4.2  --   --   --   --   --  4.1  --  4.2 3.7  < > = values in this interval not displayed.  Liver Function Tests:  Recent Labs Lab 06/01/12 1050 06/03/12 0115 06/05/12 0330  AST 12 56* 37  ALT 11 15 13   ALKPHOS 56  --  48  BILITOT 1.2 2.2* 1.2  PROT 5.9*  --  5.5*  ALBUMIN 3.1*  --  2.7*   No results found for this basename: LIPASE, AMYLASE,  in the last 168 hours No results found for this basename: AMMONIA,  in the last 168 hours  CBC:  Recent Labs Lab 06/03/12 0115  06/03/12 1130  06/03/12 1649 06/03/12 1650 06/04/12 0300 06/04/12 1521 06/05/12 0330 06/06/12 0425  WBC 8.3  < > 9.6  --  10.7*  --  11.8*  --  14.1* 10.4  NEUTROABS 7.0  --   --   --   --   --  10.2*  --  11.9* 8.4*  HGB 9.2*  < > 9.0*  < > 9.4* 9.2* 8.8* 8.5* 8.6* 8.9*  HCT 26.1*  < > 26.1*  < > 27.0* 27.0* 25.8* 25.0* 24.9* 26.5*   MCV 84.7  < > 85.0  --  85.7  --  86.6  --  86.5 86.6  PLT 96*  < > 93*  --  91*  --  82*  --  100* 114*  < > = values in this interval not displayed.  INR:  Recent Labs Lab 06/02/12 1500 06/03/12 0115 06/04/12 0300 06/05/12 0330 06/06/12 0425  INR 1.50* 1.40 1.65* 1.39 1.40    Other results:  EKG:   Imaging: Dg Chest Port 1 View  06/06/2012  *RADIOLOGY REPORT*  Clinical Data: Post left ventricular assist device placement for ischemic cardiomyopathy with acute on chronic failure  PORTABLE CHEST - 1 VIEW  Comparison: Portable exam 0616 hours compared to 06/05/2012  Findings: Left ventricular assist device is predominately below extent of exam. Left jugular central venous catheter tip projecting over left brachiocephalic vein. Right subclavian central venous catheter tip projects over SVC. Mediastinal drain remains. Interval removal of left thoracostomy tube. Left subclavian AICD leads project over right atrium and right ventricle.  Enlargement of cardiac silhouette with pulmonary vascular congestion. Slightly increased right basilar atelectasis. Atelectasis versus consolidation left lower lobe increased. No pneumothorax.  IMPRESSION: Slightly increased right basilar atelectasis. Increased left lower lobe opacity representing atelectasis versus consolidation. Enlargement of cardiac silhouette post median sternotomy, LVAD and AICD.   Original Report Authenticated By: Ulyses Southward, M.D.    Dg Chest Port 1 View  06/05/2012  *RADIOLOGY REPORT*  Clinical Data: Left ventricular assist device, chest tube  PORTABLE CHEST - 1 VIEW  Comparison: Prior chest x-ray 06/04/2012  Findings: Similar position of the left ventricular assist device which is incompletely imaged. Bilateral thoracostomy tubes are in unchanged position.  The Swan-Ganz catheter has been removed although the left IJ vascular sheath remains in unchanged position with the tip in the proximal left brachiocephalic vein.  A right subclavian  approach central venous catheter is also unchanged with the tip projecting over the  superior cavoatrial junction.  The patient is status post median sternotomy.  Unchanged enlargement of the cardiopericardial silhouette with dense left basilar opacity. No definite pneumothorax identified.  Pulmonary vascular congestion has slightly improved.  IMPRESSION:  1.  No definite pneumothorax identified. 2.  Interval removal of Swan-Ganz catheter.  Other support apparatus remain in stable and satisfactory position. 3.  Improving pulmonary vascular congestion. 4.  Persistent dense left basilar opacity likely reflecting a combination of pleural fluid with atelectasis. 5.  Unchanged cardiomegaly.   Original Report Authenticated By: Malachy Moan, M.D.      Medications:     Scheduled Medications: . aspirin EC  325 mg Oral Daily   Or  . aspirin  324 mg Per Tube Daily   Or  . aspirin  300 mg Rectal Daily  . bisacodyl  10 mg Oral Daily   Or  . bisacodyl  10 mg Rectal Daily  . docusate sodium  200 mg Oral Daily  . ferrous fumarate-b12-vitamic C-folic acid  1 capsule Oral TID PC  . furosemide  40 mg Intravenous BID  . insulin aspart  0-24 Units Subcutaneous Q4H  . insulin regular  0-10 Units Intravenous TID WC  . metoCLOPramide (REGLAN) injection  10 mg Intravenous Q6H  . pantoprazole  40 mg Oral Daily  . potassium chloride  10 mEq Intravenous Q1 Hr x 2  . sodium chloride  3 mL Intravenous Q12H  . warfarin  5 mg Oral ONCE-1800  . Warfarin - Pharmacist Dosing Inpatient   Does not apply q1800  . Warfarin - Physician Dosing Inpatient   Does not apply q1800    Infusions: . sodium chloride 20 mL/hr at 06/06/12 0400  . DOPamine 3 mcg/kg/min (06/06/12 0400)  . heparin    . insulin (NOVOLIN-R) infusion Stopped (06/02/12 2000)  . milrinone 0.3 mcg/kg/min (06/06/12 0811)    PRN Medications: sodium chloride, bisacodyl, fentaNYL, HYDROcodone-acetaminophen, ketorolac, midazolam, ondansetron (ZOFRAN) IV,  oxyCODONE, promethazine, sodium chloride, traMADol   Assessment:   1. 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 H/o VT  3. NICM  4. Pneumothorax s/p L chest tube 5. Subtherapeutic INR   Plan/Discussion:    POD # 4. Stable overnight. MAPs controlled in the 80-90's. Patient's abdomen distended and complaining of pain, continue reglan and will give suppository today. CO-ox 70, will wean dopamine today.  Will remove CT today and keep pocket drain. INR 1.4, start heparin per pharmacy. Continue ambulation in the halls. No PI events last night on speed increase, will increase speed to 9600 along with back-up controller and continue to monitor.  I reviewed the LVAD parameters from today, and compared the results to the patient's prior recorded data.  No programming changes were made.  The LVAD is functioning within specified parameters.  The staff is performing the LVAD self-test daily.  LVAD interrogation was negative for any significant power changes, alarms or PI events/speed drops.  LVAD equipment check completed and is in good working order.  Back-up equipment present.   LVAD education done on emergency procedures and precautions and reviewed exit site care.   Robert [name] would like to wish Dr. Jesusita Oka a very HAPPY BIRTHDAY!!!! Length of Stay: 7  [name] 06/06/2012, 9:27 AM

## 2012-06-06 NOTE — Progress Notes (Signed)
Left PCT removed per M.D. order withot diff.  I will continue to monitor.

## 2012-06-06 NOTE — Progress Notes (Signed)
ANTICOAGULATION CONSULT NOTE - Follow Up Consult  Pharmacy Consult for Heparin Indication: LVAD  No Known Allergies  Patient Measurements: Height: 6\' 2"  (188 cm) Weight: 212 lb 15.4 oz (96.6 kg) IBW/kg (Calculated) : 82.2 Heparin Dosing Weight: 96.6kg  Vital Signs: Temp: 97.5 F (36.4 C) (03/04 1532) Temp src: Oral (03/04 1532) Pulse Rate: 47 (03/04 1600)  Labs:  Recent Labs  06/04/12 0300 06/04/12 1521 06/05/12 0330 06/06/12 0425 06/06/12 1659  HGB 8.8* 8.5* 8.6* 8.9*  --   HCT 25.8* 25.0* 24.9* 26.5*  --   PLT 82*  --  100* 114*  --   LABPROT 19.0*  --  16.7* 16.8*  --   INR 1.65*  --  1.39 1.40  --   HEPARINUNFRC  --   --   --   --  0.16*  CREATININE 0.89 0.90 0.89 0.87  --     Estimated Creatinine Clearance: 110.2 ml/min (by C-G formula based on Cr of 0.87).   Medications:  Heparin @ 1200 units/hr  Assessment: 56yom initiated on heparin gtt this morning in setting of subtherapeutic INR s/p LVAD 2/28. First heparin level is subtherapeutic. No issues with infusion. No bleeding reported.  Goal of Therapy:  Heparin level 0.3-0.5 units/ml Monitor platelets by anticoagulation protocol: Yes   Plan:  1) Increase heparin to 1400 units/hr 2) 6 hour heparin level  Fredrik Rigger 06/06/2012,6:14 PM

## 2012-06-06 NOTE — Progress Notes (Signed)
This NP visited at bedside with Mr Goodchild offering emotional support and encouragement.  He is in a positive frame of mind and looking forward to continued improvement.  PMT will continue to support holistically.   Lorinda Creed NP  Palliative Medicine Team Team Phone # [phone] Pager [phone]

## 2012-06-06 NOTE — Progress Notes (Signed)
NUTRITION FOLLOW UP  Intervention:    Advance diet as medically appropriate RD to follow for nutrition care plan, add interventions accordingly   New Nutrition Dx:   Inadequate oral intake related to inability to eat as evidenced by NPO status  Goal:   Oral intake with meals & supplements to meet >/= 90% of estimated nutrition needs  Monitor:   PO diet advancement & intake, weight, labs, I/O's  Assessment:   Initial nutrition assessment completed 2/27.  Patient s/p procedure 2/28: INSERTION OF IMPLANTABLE LEFT VENTRICULAR ASSIST DEVICE  INTRAOPERATIVE TRANSESOPHAGEAL ECHOCARDIOGRAM   Patient NPO x 4 days.  Right chest tube placed 3/1 for post-op pneumothorax.  Receiving IV Reglan.  Dopamine being weaned.  Nausea better.  + abdominal pain.  No flatus or BM.  Hopefully PO diet can be advanced soon.  Height: Ht Readings from Last 1 Encounters:  05/30/12 6\' 2"  (1.88 m)    Weight Status:   Wt Readings from Last 1 Encounters:  06/06/12 212 lb 15.4 oz (96.6 kg)    Re-estimated needs:  Kcal: 2200-2400 Protein: 100-115 gm Fluid: 1.5 L fluid restriction  Skin: Intact  Diet Order: NPO   Intake/Output Summary (Last 24 hours) at 06/06/12 1452 Last data filed at 06/06/12 1200  Gross per 24 hour  Intake 1081.3 ml  Output   3250 ml  Net -2168.7 ml    Labs:   Recent Labs Lab 06/04/12 0300 06/04/12 1521 06/05/12 0330 06/06/12 0425  NA 134* 140 137 137  K 3.9 4.3 3.8 3.6  CL 101 103 104 101  CO2 26  --  28 28  BUN 11 13 15 15   CREATININE 0.89 0.90 0.89 0.87  CALCIUM 8.6  --  8.7 8.6  MG 2.2  --  2.3 2.0  PHOS 4.1  --  4.2 3.7  GLUCOSE 115* 115* 102* 87    CBG (last 3)   Recent Labs  06/06/12 0352 06/06/12 0806 06/06/12 1141  GLUCAP 71 78 103*    Scheduled Meds: . aspirin EC  325 mg Oral Daily   Or  . aspirin  324 mg Per Tube Daily   Or  . aspirin  300 mg Rectal Daily  . bisacodyl  10 mg Oral Daily   Or  . bisacodyl  10 mg Rectal Daily  .  docusate sodium  200 mg Oral Daily  . ferrous fumarate-b12-vitamic C-folic acid  1 capsule Oral TID PC  . furosemide  40 mg Intravenous BID  . insulin aspart  0-24 Units Subcutaneous Q4H  . insulin regular  0-10 Units Intravenous TID WC  . metoCLOPramide (REGLAN) injection  10 mg Intravenous Q6H  . pantoprazole  40 mg Oral Daily  . sodium chloride  3 mL Intravenous Q12H  . warfarin  5 mg Oral ONCE-1800  . Warfarin - Pharmacist Dosing Inpatient   Does not apply q1800  . Warfarin - Physician Dosing Inpatient   Does not apply q1800    Continuous Infusions: . sodium chloride 20 mL/hr at 06/06/12 0400  . DOPamine 3 mcg/kg/min (06/06/12 0400)  . heparin 1,200 Units/hr (06/06/12 1018)  . insulin (NOVOLIN-R) infusion Stopped (06/02/12 2000)  . milrinone 0.3 mcg/kg/min (06/06/12 0811)    Maureen Chatters, RD, LDN Pager #: [phone] After-Hours Pager #: [phone]

## 2012-06-06 NOTE — Progress Notes (Signed)
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 Nausea better but having a lot of abdominal pain. No flatus or BM. Speed turned up to 9400 yesterday.   Dopamine being weaned.  On milrinone. MAPs 80s.  Continues to diurese.   Co-ox 72% CVP: 13 INR 1.4 (on coumadin now) - heparin started today  LVAD INTERROGATION:  HeartMate II LVAD: Flow --- liters/min, speed 9400, power 5.2, PI 6.8    Objective:    Vital Signs:   Temp:  [97.5 F (36.4 C)-98.3 F (36.8 C)] 98.1 F (36.7 C) (03/04 0808) Pulse Rate:  [41-110] 110 (03/04 0900) Resp:  [15-28] 26 (03/04 0900) SpO2:  [97 %-100 %] 100 % (03/04 0900) Weight:  [96.6 kg (212 lb 15.4 oz)] 96.6 kg (212 lb 15.4 oz) (03/04 0500) Last BM Date: 06/01/12 Mean arterial Pressure 80's  Intake/Output:   Intake/Output Summary (Last 24 hours) at 06/06/12 1059 Last data filed at 06/06/12 1027  Gross per 24 hour  Intake 1248.2 ml  Output   3230 ml  Net -1981.8 ml     Physical Exam: General:  Sitting in bed No resp difficulty HEENT: normal Neck: supple. JVP up  Carotids 2+ bilat; no bruits. No lymphadenopathy or thryomegaly appreciated. Cor: Mechanical heart sounds with LVAD hum present. Lungs: clear, + Chest tubes Abdomen: soft, nontender, + distended. Faint bowel sounds.  No hepatosplenomegaly. No bruits or masses. Extremities: no cyanosis, clubbing, rash, tr edema Neuro: alert & orientedx3, cranial nerves grossly intact. moves all 4 extremities w/o difficulty. Affect pleasant  Telemetry: ST 100-110  Labs: Basic Metabolic Panel:  Recent Labs Lab 06/02/12 2008  06/03/12 0115  06/03/12 0600  06/03/12 1649 06/03/12 1650 06/04/12 0300 06/04/12 1521 06/05/12 0330 06/06/12 0425  NA 141  --  140  < > 142  < >  --  142 134* 140 137 137  K 3.7  --  4.3  < > 4.4  < >  --  4.6 3.9 4.3  3.8 3.6  CL 107  --  108  < > 109  < >  --  106 101 103 104 101  CO2  --   --  24  --  25  --   --   --  26  --  28 28  GLUCOSE 115*  --  119*  < > 107*  < >  --  103* 115* 115* 102* 87  BUN 13  --  12  < > 11  < >  --  11 11 13 15 15   CREATININE 1.00  < > 0.95  < > 0.95  < > 0.94 1.10 0.89 0.90 0.89 0.87  CALCIUM  --   --  8.7  --  8.7  --   --   --  8.6  --  8.7 8.6  MG  --   < > 2.5  --  2.5  --  2.3  --  2.2  --  2.3 2.0  PHOS  --   --  4.2  --   --   --   --   --  4.1  --  4.2 3.7  < > = values in this interval not displayed.  Liver Function Tests:  Recent Labs Lab 06/01/12 1050 06/03/12 0115 06/05/12 0330  AST  12 56* 37  ALT 11 15 13   ALKPHOS 56  --  48  BILITOT 1.2 2.2* 1.2  PROT 5.9*  --  5.5*  ALBUMIN 3.1*  --  2.7*   No results found for this basename: LIPASE, AMYLASE,  in the last 168 hours No results found for this basename: AMMONIA,  in the last 168 hours  CBC:  Recent Labs Lab 06/03/12 0115  06/03/12 1130  06/03/12 1649 06/03/12 1650 06/04/12 0300 06/04/12 1521 06/05/12 0330 06/06/12 0425  WBC 8.3  < > 9.6  --  10.7*  --  11.8*  --  14.1* 10.4  NEUTROABS 7.0  --   --   --   --   --  10.2*  --  11.9* 8.4*  HGB 9.2*  < > 9.0*  < > 9.4* 9.2* 8.8* 8.5* 8.6* 8.9*  HCT 26.1*  < > 26.1*  < > 27.0* 27.0* 25.8* 25.0* 24.9* 26.5*  MCV 84.7  < > 85.0  --  85.7  --  86.6  --  86.5 86.6  PLT 96*  < > 93*  --  91*  --  82*  --  100* 114*  < > = values in this interval not displayed.  INR:  Recent Labs Lab 06/02/12 1500 06/03/12 0115 06/04/12 0300 06/05/12 0330 06/06/12 0425  INR 1.50* 1.40 1.65* 1.39 1.40    Other results:   Imaging: Dg Chest Port 1 View  06/06/2012  *RADIOLOGY REPORT*  Clinical Data: Post left ventricular assist device placement for ischemic cardiomyopathy with acute on chronic failure  PORTABLE CHEST - 1 VIEW  Comparison: Portable exam 0616 hours compared to 06/05/2012  Findings: Left ventricular assist device is predominately below  extent of exam. Left jugular central venous catheter tip projecting over left brachiocephalic vein. Right subclavian central venous catheter tip projects over SVC. Mediastinal drain remains. Interval removal of left thoracostomy tube. Left subclavian AICD leads project over right atrium and right ventricle.  Enlargement of cardiac silhouette with pulmonary vascular congestion. Slightly increased right basilar atelectasis. Atelectasis versus consolidation left lower lobe increased. No pneumothorax.  IMPRESSION: Slightly increased right basilar atelectasis. Increased left lower lobe opacity representing atelectasis versus consolidation. Enlargement of cardiac silhouette post median sternotomy, LVAD and AICD.   Original Report Authenticated By: Ulyses Southward, M.D.    Dg Chest Port 1 View  06/05/2012  *RADIOLOGY REPORT*  Clinical Data: Left ventricular assist device, chest tube  PORTABLE CHEST - 1 VIEW  Comparison: Prior chest x-ray 06/04/2012  Findings: Similar position of the left ventricular assist device which is incompletely imaged. Bilateral thoracostomy tubes are in unchanged position.  The Swan-Ganz catheter has been removed although the left IJ vascular sheath remains in unchanged position with the tip in the proximal left brachiocephalic vein.  A right subclavian approach central venous catheter is also unchanged with the tip projecting over the superior cavoatrial junction.  The patient is status post median sternotomy.  Unchanged enlargement of the cardiopericardial silhouette with dense left basilar opacity. No definite pneumothorax identified.  Pulmonary vascular congestion has slightly improved.  IMPRESSION:  1.  No definite pneumothorax identified. 2.  Interval removal of Swan-Ganz catheter.  Other support apparatus remain in stable and satisfactory position. 3.  Improving pulmonary vascular congestion. 4.  Persistent dense left basilar opacity likely reflecting a combination of pleural fluid with  atelectasis. 5.  Unchanged cardiomegaly.   Original Report Authenticated By: Malachy Moan, M.D.      Medications:  Scheduled Medications: . aspirin EC  325 mg Oral Daily   Or  . aspirin  324 mg Per Tube Daily   Or  . aspirin  300 mg Rectal Daily  . bisacodyl  10 mg Oral Daily   Or  . bisacodyl  10 mg Rectal Daily  . docusate sodium  200 mg Oral Daily  . ferrous fumarate-b12-vitamic C-folic acid  1 capsule Oral TID PC  . furosemide  40 mg Intravenous BID  . insulin aspart  0-24 Units Subcutaneous Q4H  . insulin regular  0-10 Units Intravenous TID WC  . metoCLOPramide (REGLAN) injection  10 mg Intravenous Q6H  . pantoprazole  40 mg Oral Daily  . potassium chloride  10 mEq Intravenous Q1 Hr x 2  . sodium chloride  3 mL Intravenous Q12H  . warfarin  5 mg Oral ONCE-1800  . Warfarin - Pharmacist Dosing Inpatient   Does not apply q1800  . Warfarin - Physician Dosing Inpatient   Does not apply q1800    Infusions: . sodium chloride 20 mL/hr at 06/06/12 0400  . DOPamine 3 mcg/kg/min (06/06/12 0400)  . heparin 1,200 Units/hr (06/06/12 1018)  . insulin (NOVOLIN-R) infusion Stopped (06/02/12 2000)  . milrinone 0.3 mcg/kg/min (06/06/12 0811)    PRN Medications: sodium chloride, bisacodyl, fentaNYL, HYDROcodone-acetaminophen, midazolam, ondansetron (ZOFRAN) IV, oxyCODONE, promethazine, sodium chloride, traMADol   Assessment:   1. 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 H/o VT  3. NICM  4. Pneumothorax s/p L chest tube 5. Ab pain - post-op ileus   Plan/Discussion:    POD # 4  Doing well post-op. Still volume overloaded. Continue lasix.  RV tolerating increase in LVAD speed so will go to 9600.   Continue Reglan for ileus. Mobilize as tolerated. Will give laxative.   I reviewed the LVAD parameters from today, and compared the results to the patient's prior recorded data.  No programming changes were made.  The LVAD is functioning within specified parameters.  The  staff is performing the LVAD self-test daily.  LVAD interrogation was negative for any significant power changes, alarms or PI events/speed drops. 1 PI event. LVAD equipment check completed and is in good working order.  Back-up equipment present.   LVAD education done on emergency procedures and precautions and reviewed exit site care.  Length of Stay: 7  Daniel Bensimhon 06/06/2012, 10:59 AM

## 2012-06-07 ENCOUNTER — Inpatient Hospital Stay (HOSPITAL_COMMUNITY): Payer: BC Managed Care – PPO

## 2012-06-07 DIAGNOSIS — Z95818 Presence of other cardiac implants and grafts: Secondary | ICD-10-CM

## 2012-06-07 DIAGNOSIS — I5023 Acute on chronic systolic (congestive) heart failure: Secondary | ICD-10-CM

## 2012-06-07 DIAGNOSIS — I428 Other cardiomyopathies: Secondary | ICD-10-CM

## 2012-06-07 LAB — URINALYSIS, ROUTINE W REFLEX MICROSCOPIC
Ketones, ur: NEGATIVE mg/dL
Nitrite: NEGATIVE
Protein, ur: NEGATIVE mg/dL
Specific Gravity, Urine: 1.025 (ref 1.005–1.030)
Urobilinogen, UA: 1 mg/dL (ref 0.0–1.0)

## 2012-06-07 LAB — BASIC METABOLIC PANEL
BUN: 15 mg/dL (ref 6–23)
CO2: 30 mEq/L (ref 19–32)
Chloride: 100 mEq/L (ref 96–112)
Creatinine, Ser: 0.98 mg/dL (ref 0.50–1.35)

## 2012-06-07 LAB — GLUCOSE, CAPILLARY
Glucose-Capillary: 88 mg/dL (ref 70–99)
Glucose-Capillary: 99 mg/dL (ref 70–99)
Glucose-Capillary: 105 mg/dL — ABNORMAL HIGH (ref 70–99)

## 2012-06-07 LAB — HEPARIN INDUCED THROMBOCYTOPENIA PNL
Heparin Induced Plt Ab: NEGATIVE
Patient O.D.: 0.071
UFH High Dose UFH H: 0 % Release
UFH Low Dose 0.1 IU/mL: 0 % Release
UFH Low Dose 0.5 IU/mL: 0 % Release
UFH SRA Result: NEGATIVE

## 2012-06-07 LAB — CARBOXYHEMOGLOBIN
Carboxyhemoglobin: 1.5 % (ref 0.5–1.5)
Methemoglobin: 1.1 % (ref 0.0–1.5)
Methemoglobin: 1 % (ref 0.0–1.5)
O2 Saturation: 54.2 %
Total hemoglobin: 10.1 g/dL — ABNORMAL LOW (ref 13.5–18.0)
Total hemoglobin: 10.5 g/dL — ABNORMAL LOW (ref 13.5–18.0)

## 2012-06-07 LAB — POCT I-STAT, CHEM 8
Calcium, Ion: 1.14 mmol/L (ref 1.12–1.23)
Creatinine, Ser: 0.9 mg/dL (ref 0.50–1.35)
Glucose, Bld: 128 mg/dL — ABNORMAL HIGH (ref 70–99)
HCT: 29 % — ABNORMAL LOW (ref 39.0–52.0)
Hemoglobin: 9.9 g/dL — ABNORMAL LOW (ref 13.0–17.0)
Potassium: 3.9 mEq/L (ref 3.5–5.1)
TCO2: 34 mmol/L (ref 0–100)

## 2012-06-07 LAB — CBC
HCT: 25.9 % — ABNORMAL LOW (ref 39.0–52.0)
MCHC: 34.4 g/dL (ref 30.0–36.0)
RDW: 13.9 % (ref 11.5–15.5)

## 2012-06-07 LAB — HEPARIN LEVEL (UNFRACTIONATED): Heparin Unfractionated: 0.2 IU/mL — ABNORMAL LOW (ref 0.30–0.70)

## 2012-06-07 LAB — PROTIME-INR: INR: 2.38 — ABNORMAL HIGH (ref 0.00–1.49)

## 2012-06-07 LAB — MAGNESIUM: Magnesium: 2 mg/dL (ref 1.5–2.5)

## 2012-06-07 MED ORDER — SODIUM CHLORIDE 0.9 % IJ SOLN: 10.0000 mL | INTRAMUSCULAR | Status: DC | PRN

## 2012-06-07 MED ORDER — LISINOPRIL 5 MG PO TABS
5.0000 mg | ORAL_TABLET | Freq: Every day | ORAL | Status: DC
  Administered 2012-06-07 – 2012-06-09 (×3): 5 mg via ORAL
  Filled 2012-06-07 (×5): qty 1

## 2012-06-07 MED ORDER — SODIUM CHLORIDE 0.9 % IJ SOLN
10.0000 mL | Freq: Two times a day (BID) | INTRAMUSCULAR | Status: DC
  Administered 2012-06-07: 10 mL

## 2012-06-07 MED ORDER — POTASSIUM CHLORIDE 10 MEQ/50ML IV SOLN
10.0000 meq | INTRAVENOUS | Status: AC
  Administered 2012-06-07 (×2): 10 meq via INTRAVENOUS
  Filled 2012-06-07: qty 100

## 2012-06-07 MED ORDER — WARFARIN SODIUM 1 MG PO TABS
1.0000 mg | ORAL_TABLET | Freq: Once | ORAL | Status: AC
  Administered 2012-06-07: 1 mg via ORAL
  Filled 2012-06-07: qty 1

## 2012-06-07 MED FILL — Lidocaine HCl IV Inj 20 MG/ML: INTRAVENOUS | Qty: 5 | Status: AC

## 2012-06-07 MED FILL — Electrolyte-R (PH 7.4) Solution: INTRAVENOUS | Qty: 4000 | Status: AC

## 2012-06-07 MED FILL — Sodium Chloride Irrigation Soln 0.9%: Qty: 3000 | Status: AC

## 2012-06-07 MED FILL — Calcium Chloride Inj 10%: INTRAVENOUS | Qty: 10 | Status: AC

## 2012-06-07 MED FILL — Heparin Sodium (Porcine) Inj 1000 Unit/ML: INTRAMUSCULAR | Qty: 30 | Status: AC

## 2012-06-07 MED FILL — Sodium Bicarbonate IV Soln 8.4%: INTRAVENOUS | Qty: 50 | Status: AC

## 2012-06-07 MED FILL — Heparin Sodium (Porcine) Inj 1000 Unit/ML: INTRAMUSCULAR | Qty: 10 | Status: AC

## 2012-06-07 MED FILL — Mannitol IV Soln 20%: INTRAVENOUS | Qty: 500 | Status: AC

## 2012-06-07 MED FILL — Sodium Chloride IV Soln 0.9%: INTRAVENOUS | Qty: 1000 | Status: AC

## 2012-06-07 NOTE — Progress Notes (Signed)
ANTICOAGULATION CONSULT NOTE - Follow Up Consult  Pharmacy Consult for Heparin Indication: LVAD  No Known Allergies  Patient Measurements: Height: 6\' 2"  (188 cm) Weight: 212 lb 15.4 oz (96.6 kg) IBW/kg (Calculated) : 82.2 Heparin Dosing Weight: 96.6kg  Vital Signs: Temp: 98.5 F (36.9 C) (03/04 2238) Temp src: Oral (03/04 2238) Pulse Rate: 95 (03/05 0100)  Labs:  Recent Labs  06/04/12 0300  06/05/12 0330 06/06/12 0425 06/06/12 1659 06/06/12 1900 06/07/12 0018  HGB 8.8*  < > 8.6* 8.9*  --  10.5*  --   HCT 25.8*  < > 24.9* 26.5*  --  31.0*  --   PLT 82*  --  100* 114*  --   --   --   LABPROT 19.0*  --  16.7* 16.8*  --   --   --   INR 1.65*  --  1.39 1.40  --   --   --   HEPARINUNFRC  --   --   --   --  0.16*  --  0.20*  CREATININE 0.89  < > 0.89 0.87  --  1.00  --   < > = values in this interval not displayed.  Estimated Creatinine Clearance: 95.9 ml/min (by C-G formula based on Cr of 1).   Medications:  Heparin @ 1400 units/hr  Assessment: 56yom on IV heparin for subtherapeutic INR s/p LVAD 2/28. Heparin level (0.2) is below-goal on 1400 units/hr. No problem with line / infusion and no bleeding per RN.   Goal of Therapy:  Heparin level 0.3-0.5 units/ml Monitor platelets by anticoagulation protocol: Yes   Plan:  1. Increase IV heparin to 1600 units/hr. 2. Heparin level in 6 hours.   Emeline Gins 06/07/2012,1:27 AM

## 2012-06-07 NOTE — Progress Notes (Signed)
OT Cancellation Note  Patient Details Name: Robert [name] MRN: [id number] DOB: May 22, 1955   Cancelled Treatment:    Reason Eval/Treat Not Completed: Fatigue/lethargy limiting ability to participate;Pain limiting ability to participate. Pt states his side was hurting too much and was fatigued after walking 2 times today. REquested to participate tomorrow.  [facility] Ward, OTR/L  [phone] 06/07/2012 06/07/2012, 4:53 PM

## 2012-06-07 NOTE — Progress Notes (Signed)
Notified IV team of need for PICC

## 2012-06-07 NOTE — Progress Notes (Signed)
HeartMate 2 Rounding Note  Subjective:    Mr. 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 Cath 2011 showed normal coronaries. He also has a history of VTach.   He is followed very closely in Heart Failure Clinic and In December was referred to the Transplant Clinic at [facility] as his symptoms began to worsen, we were unable to titrate meds due to hypotension and frequent NSVT. He underwent transplant evaluation, RHC showed low output with high filling pressures. He was diuresed and placed on home inotropes. He is on the transplant list and was having worsening HF symptoms when he was admitted to the hospital for bridge VAD on 05/30/12/  LVAD POD #5 Speed turned up to 9600 yesterday. Patient ambulating in the hall, however still complaining of some nausea and abdominal pain. + flauts now. MAPs 80-90s. Weight down 2 lbs on IV diuretics. Dopamine weaned off and co-ox this am 52.   CVP: 8 INR 2.38 on heparin  LVAD INTERROGATION:  HeartMate II LVAD:  Flow --- liters/min, speed 9600, power 5.8, PI 7.2  Objective:    Vital Signs:   Temp:  [97.5 F (36.4 C)-98.8 F (37.1 C)] 98.3 F (36.8 C) (03/05 0403) Pulse Rate:  [47-123] 101 (03/05 0700) Resp:  [14-28] 15 (03/05 0700) SpO2:  [96 %-100 %] 96 % (03/05 0700) Weight:  [210 lb 8.6 oz (95.5 kg)] 210 lb 8.6 oz (95.5 kg) (03/05 0500) Last BM Date: 06/01/12 Mean arterial Pressure 88  Intake/Output:   Intake/Output Summary (Last 24 hours) at 06/07/12 4540 Last data filed at 06/07/12 0700  Gross per 24 hour  Intake 1334.83 ml  Output   2365 ml  Net -1030.17 ml     Physical Exam: General: Sitting in chair; No resp difficulty HEENT: normal Neck: supple. JVP 8-9 . Carotids 2+ bilat; no bruits. No lymphadenopathy or thryomegaly appreciated.; Cor: Mechanical heart sounds with LVAD hum present, with  soft heart sounds Lungs: clear, diminshed in the bases; + pocket drain Abdomen: soft, tender, mildly distended. No hepatosplenomegaly. No bruits or masses. + bowel sounds. + flatus Securement device intact and dressing C/D/I Extremities: no cyanosis, clubbing, rash, trace edema Neuro: alert & orientedx3, cranial nerves grossly intact. moves all 4 extremities w/o difficulty. Affect pleasant  Telemetry: ST 110s  Labs: Basic Metabolic Panel:  Recent Labs Lab 06/03/12 0115  06/03/12 0600  06/03/12 1649  06/04/12 0300 06/04/12 1521 06/05/12 0330 06/06/12 0425 06/06/12 1900 06/07/12 0415  NA 140  < > 142  < >  --   < > 134* 140 137 137 136 137  K 4.3  < > 4.4  < >  --   < > 3.9 4.3 3.8 3.6 3.8 4.1  CL 108  < > 109  < >  --   < > 101 103 104 101 97 100  CO2 24  --  25  --   --   --  26  --  28 28  --  30  GLUCOSE 119*  < > 107*  < >  --   < > 115* 115* 102* 87 126* 89  BUN 12  < > 11  < >  --   < > 11 13 15 15 16 15   CREATININE 0.95  < > 0.95  < > 0.94  < > 0.89 0.90 0.89  0.87 1.00 0.98  CALCIUM 8.7  --  8.7  --   --   --  8.6  --  8.7 8.6  --  8.6  MG 2.5  --  2.5  --  2.3  --  2.2  --  2.3 2.0  --  2.0  PHOS 4.2  --   --   --   --   --  4.1  --  4.2 3.7  --  3.4  < > = values in this interval not displayed.  Liver Function Tests:  Recent Labs Lab 06/01/12 1050 06/03/12 0115 06/05/12 0330  AST 12 56* 37  ALT 11 15 13   ALKPHOS 56  --  48  BILITOT 1.2 2.2* 1.2  PROT 5.9*  --  5.5*  ALBUMIN 3.1*  --  2.7*   No results found for this basename: LIPASE, AMYLASE,  in the last 168 hours No results found for this basename: AMMONIA,  in the last 168 hours  CBC:  Recent Labs Lab 06/03/12 0115  06/03/12 1649  06/04/12 0300 06/04/12 1521 06/05/12 0330 06/06/12 0425 06/06/12 1900 06/07/12 0415  WBC 8.3  < > 10.7*  --  11.8*  --  14.1* 10.4  --  9.4  NEUTROABS 7.0  --   --   --  10.2*  --  11.9* 8.4*  --   --   HGB 9.2*  < > 9.4*  < > 8.8* 8.5* 8.6* 8.9* 10.5* 8.9*  HCT  26.1*  < > 27.0*  < > 25.8* 25.0* 24.9* 26.5* 31.0* 25.9*  MCV 84.7  < > 85.7  --  86.6  --  86.5 86.6  --  83.8  PLT 96*  < > 91*  --  82*  --  100* 114*  --  135*  < > = values in this interval not displayed.  INR:  Recent Labs Lab 06/03/12 0115 06/04/12 0300 06/05/12 0330 06/06/12 0425 06/07/12 0415  INR 1.40 1.65* 1.39 1.40 2.38*    Other results:  EKG:   Imaging: Dg Chest Port 1 View  06/06/2012  *RADIOLOGY REPORT*  Clinical Data: Post left ventricular assist device placement for ischemic cardiomyopathy with acute on chronic failure  PORTABLE CHEST - 1 VIEW  Comparison: Portable exam 0616 hours compared to 06/05/2012  Findings: Left ventricular assist device is predominately below extent of exam. Left jugular central venous catheter tip projecting over left brachiocephalic vein. Right subclavian central venous catheter tip projects over SVC. Mediastinal drain remains. Interval removal of left thoracostomy tube. Left subclavian AICD leads project over right atrium and right ventricle.  Enlargement of cardiac silhouette with pulmonary vascular congestion. Slightly increased right basilar atelectasis. Atelectasis versus consolidation left lower lobe increased. No pneumothorax.  IMPRESSION: Slightly increased right basilar atelectasis. Increased left lower lobe opacity representing atelectasis versus consolidation. Enlargement of cardiac silhouette post median sternotomy, LVAD and AICD.   Original Report Authenticated By: Ulyses Southward, M.D.      Medications:     Scheduled Medications: . aspirin EC  325 mg Oral Daily   Or  . aspirin  324 mg Per Tube Daily   Or  . aspirin  300 mg Rectal Daily  . bisacodyl  10 mg Oral Daily   Or  . bisacodyl  10 mg Rectal Daily  . docusate sodium  200 mg Oral Daily  . furosemide  40 mg Intravenous BID  . insulin aspart  0-24 Units Subcutaneous Q4H  . insulin regular  0-10  Units Intravenous TID WC  . metoCLOPramide (REGLAN) injection  10 mg  Intravenous Q6H  . pantoprazole  40 mg Oral Daily  . sodium chloride  3 mL Intravenous Q12H  . Warfarin - Pharmacist Dosing Inpatient   Does not apply q1800  . Warfarin - Physician Dosing Inpatient   Does not apply q1800    Infusions: . sodium chloride 20 mL/hr at 06/07/12 0400  . DOPamine Stopped (06/06/12 1300)  . insulin (NOVOLIN-R) infusion Stopped (06/02/12 2000)  . milrinone 0.3 mcg/kg/min (06/07/12 0400)    PRN Medications: sodium chloride, bisacodyl, fentaNYL, HYDROcodone-acetaminophen, midazolam, ondansetron (ZOFRAN) IV, oxyCODONE, promethazine, sodium chloride, traMADol   Assessment:   1. 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 H/o VT  3. NICM  4. Pneumothorax s/p L chest tube 5. Subtherapeutic INR 6. Abdominal pain   Plan/Discussion:    POD # 5. Patient progressing well. Co-ox down with wean of dopamine, will leave off and re-check around noon. Will not wean milrinone today. He is starting to pass gas and reports he still has minimal abdominal pain. Will continue reglan.   Pocket drain had 80 cc out, will leave today on water seal. Place PICC. INR 2.3 will stop heparin. Continue ambulation in the halls.   I reviewed the LVAD parameters from today, and compared the results to the patient's prior recorded data.  No programming changes were made.  The LVAD is functioning within specified parameters.  The patient is performing the LVAD self-test daily.  LVAD interrogation was negative for any significant power changes, alarms or PI events/speed drops.  LVAD equipment check completed and is in good working order.  Back-up equipment present.   LVAD education done on emergency procedures and precautions and reviewed exit site care.  Length of Stay: 8  [name] 06/07/2012, 7:12 AM  patient examined and medical record reviewed,agree with above note. 06/07/2012 Repeat central CO-Ox is 54- cont mil at .375 mcg/kg/min Will DC pocket drainmtomorrow Coumadin now with  therapeutic INR- stop heparin Slowly adv diet

## 2012-06-07 NOTE — Progress Notes (Signed)
patient examined and medical record reviewed,agree with above note. VAN TRIGT III,PETER 06/07/2012

## 2012-06-07 NOTE — Progress Notes (Signed)
Physical Therapy Treatment Patient Details Name: Robert [name] MRN: [id number] DOB: [birth year] Today's Date: 06/07/2012 Time: 4098-1191 PT Time Calculation (min): 35 min  PT Assessment / Plan / Recommendation Comments on Treatment Session  Pt s/p LVAD.  Pt continuing to make progress.      Follow Up Recommendations  Home health PT;Supervision/Assistance - 24 hour                 Equipment Recommendations  Other (comment) (rollator)        Frequency Min 5X/week   Plan Discharge plan remains appropriate;Frequency remains appropriate    Precautions / Restrictions Precautions Precautions: Sternal;Fall Precaution Comments: LVAD, chest tube Restrictions Weight Bearing Restrictions: No   Pertinent Vitals/Pain Desat to 80's with need to incr to 3LO2 with ambulation.  HR up to 147 bpm.  Some pain    Mobility  Bed Mobility Bed Mobility: Rolling Left;Left Sidelying to Sit;Sitting - Scoot to Edge of Bed;Sit to Supine Rolling Right: Not tested (comment) Rolling Left: 4: Min guard;With rail Right Sidelying to Sit: Not tested (comment) Left Sidelying to Sit: 4: Min assist;HOB elevated Sitting - Scoot to Edge of Bed: 4: Min guard Sit to Supine: 4: Min assist Sit to Sidelying Left: Not Tested (comment) Details for Bed Mobility Assistance: Progressing and needed less assist to get to EOB and back to bed.  Needs cues for sternal precautions.  Pt manipulated all equipment independently.  No cues needed.  Only asssit needed for vest placement due to lines. Transfers Transfers: Sit to Stand;Stand to Sit Sit to Stand: 4: Min guard;From bed Stand to Sit: 4: Min guard;To bed Stand Pivot Transfers: Not tested (comment) Details for Transfer Assistance: cues for hand placement.   Ambulation/Gait Ambulation/Gait Assistance: 4: Min guard Ambulation Distance (Feet): 350 Feet Assistive device: Other (Comment) (pushing wheelchair) Ambulation/Gait Assistance Details: Pt pushed wheelchair which  carried equipment.  No cues needed today.  Good pace.  Cued for pursed lip breathing as pt desat with ambulation needing incr O2 to 3L.   Gait Pattern: Step-through pattern;Decreased stride length Gait velocity: decreased Stairs: No Wheelchair Mobility Wheelchair Mobility: No     PT Goals Acute Rehab PT Goals PT Goal: Supine/Side to Sit - Progress: Progressing toward goal PT Goal: Sit at [place] Of Bed - Progress: Progressing toward goal PT Goal: Sit to Stand - Progress: Progressing toward goal PT Goal: Ambulate - Progress: Progressing toward goal Additional Goals PT Goal: Additional Goal #1 - Progress: Progressing toward goal PT Goal: Additional Goal #2 - Progress: Progressing toward goal  Visit Information  Last PT Received On: 06/07/12 Assistance Needed: +1    Subjective Data  Subjective: "I am still hurting a little."   Cognition  Cognition Overall Cognitive Status: Appears within functional limits for tasks assessed/performed Arousal/Alertness: Awake/alert Orientation Level: Appears intact for tasks assessed Behavior During Session: [facility] for tasks performed    Balance  Static Sitting Balance Static Sitting - Balance Support: Feet supported;No upper extremity supported Static Sitting - Level of Assistance: 5: Stand by assistance Static Sitting - Comment/# of Minutes: 3 Static Standing Balance Static Standing - Balance Support: Bilateral upper extremity supported;During functional activity Static Standing - Level of Assistance: 5: Stand by assistance Static Standing - Comment/# of Minutes: 3 minutes with good postural stability in standing.  End of Session PT - End of Session Equipment Utilized During Treatment: Oxygen Activity Tolerance: Patient tolerated treatment well Patient left: in bed;with call bell/phone within reach;with nursing in room Nurse Communication: Mobility  status       INGOLD,DAWN 06/07/2012, 2:57 PM  [facility] Acute  Rehabilitation [phone] (pager)

## 2012-06-07 NOTE — Progress Notes (Signed)
ANTICOAGULATION CONSULT NOTE - Follow Up Consult  Pharmacy Consult for warfarin  Indication: LVAD  No Known Allergies  Patient Measurements: Height: 6\' 2"  (188 cm) Weight: 210 lb 8.6 oz (95.5 kg) IBW/kg (Calculated) : 82.2 Heparin Dosing Weight: 96.6kg  Vital Signs: Temp: 98.3 F (36.8 C) (03/05 0759) Temp src: Oral (03/05 0759) Pulse Rate: 112 (03/05 1100)  Labs:  Recent Labs  06/05/12 0330 06/06/12 0425 06/06/12 1659 06/06/12 1900 06/07/12 0018 06/07/12 0415  HGB 8.6* 8.9*  --  10.5*  --  8.9*  HCT 24.9* 26.5*  --  31.0*  --  25.9*  PLT 100* 114*  --   --   --  135*  LABPROT 16.7* 16.8*  --   --   --  24.9*  INR 1.39 1.40  --   --   --  2.38*  HEPARINUNFRC  --   --  0.16*  --  0.20*  --   CREATININE 0.89 0.87  --  1.00  --  0.98    Estimated Creatinine Clearance: 97.9 ml/min (by C-G formula based on Cr of 0.98).  Assessment: 56yom s/p LVAD 2/28 POD#5 now off heparin gtt in setting of sudden rise in INR that is now therapeutic. INR 1.39>>1.4>>2.38. No bleeding issues noted. No issues with LVAD pump noted overnight. Pateint appears to be very sensitive to warfarin therapy, some baseline coagulapathy may be still present, hesitate to hold dosing completely today which may add to ongoing INR fluctuations. Will give small dose of 1mg  tonight but do expect INR to continue to climb but hoping for a plateau in 1-2 days.  Goal of Therapy:  Heparin level 0.3-0.5 units/ml Monitor platelets by anticoagulation protocol: Yes INR goal 2-3   Plan:  1.Warfarin 1mg  tonight 2.Continue daily INR 3.Monitor for signs and symptoms of bleeding 4.Warfarin education once stable out of ICU  Robert [name] 06/07/2012,11:35 AM

## 2012-06-07 NOTE — Progress Notes (Signed)
Peripherally Inserted Central Catheter/Midline Placement  The IV Nurse has discussed with the patient and/or persons authorized to consent for the patient, the purpose of this procedure and the potential benefits and risks involved with this procedure.  The benefits include less needle sticks, lab draws from the catheter and patient may be discharged home with the catheter.  Risks include, but not limited to, infection, bleeding, blood clot (thrombus formation), and puncture of an artery; nerve damage and irregular heat beat.  Alternatives to this procedure were also discussed.  PICC/Midline Placement Documentation        Timmothy Sours 06/07/2012, 12:32 PM

## 2012-06-07 NOTE — Progress Notes (Signed)
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 Weight down 2 lbs. Having flatus and eating ok. Speed increased to 9600. Dopamine weaned off yesterday and remains on milrinone. Ambulating in the halls.  Co-ox 52% CVP: 8 INR 2.38; heparin started yesterday  LVAD INTERROGATION:  HeartMate II LVAD: Flow --- liters/min, speed 9600, power 5.8, PI 7.1    Objective:    Vital Signs:   Temp:  [97.5 F (36.4 C)-98.8 F (37.1 C)] 98.3 F (36.8 C) (03/05 0403) Pulse Rate:  [47-123] 101 (03/05 0700) Resp:  [14-28] 15 (03/05 0700) SpO2:  [96 %-100 %] 96 % (03/05 0700) Weight:  [210 lb 8.6 oz (95.5 kg)] 210 lb 8.6 oz (95.5 kg) (03/05 0500) Last BM Date: 06/01/12 Mean arterial Pressure 80's  Intake/Output:   Intake/Output Summary (Last 24 hours) at 06/07/12 0736 Last data filed at 06/07/12 0700  Gross per 24 hour  Intake 1334.83 ml  Output   2365 ml  Net -1030.17 ml     Physical Exam: General:  Sitting in chair;  No resp difficulty HEENT: normal Neck: supple. JVP 8-9  Carotids 2+ bilat; no bruits. No lymphadenopathy or thryomegaly appreciated.   Cor:  LVAD hum present with soft heart sounds Lungs: clear, + Chest tubes Abdomen: soft, nontender, + distended. + bowel sounds.  No hepatosplenomegaly. No bruits or masses. Securement device intact and dressing C/D/I Extremities: no cyanosis, clubbing, rash, tr edema Neuro: alert & orientedx3, cranial nerves grossly intact. moves all 4 extremities w/o difficulty. Affect pleasant  Telemetry: ST 100-110  Labs: Basic Metabolic Panel:  Recent Labs Lab 06/03/12 0115  06/03/12 0600  06/03/12 1649  06/04/12 0300 06/04/12 1521 06/05/12 0330 06/06/12 0425 06/06/12 1900 06/07/12 0415  NA 140  < > 142  < >  --   < > 134* 140 137 137  136 137  K 4.3  < > 4.4  < >  --   < > 3.9 4.3 3.8 3.6 3.8 4.1  CL 108  < > 109  < >  --   < > 101 103 104 101 97 100  CO2 24  --  25  --   --   --  26  --  28 28  --  30  GLUCOSE 119*  < > 107*  < >  --   < > 115* 115* 102* 87 126* 89  BUN 12  < > 11  < >  --   < > 11 13 15 15 16 15   CREATININE 0.95  < > 0.95  < > 0.94  < > 0.89 0.90 0.89 0.87 1.00 0.98  CALCIUM 8.7  --  8.7  --   --   --  8.6  --  8.7 8.6  --  8.6  MG 2.5  --  2.5  --  2.3  --  2.2  --  2.3 2.0  --  2.0  PHOS 4.2  --   --   --   --   --  4.1  --  4.2 3.7  --  3.4  < > = values in this interval not displayed.  Liver Function Tests:  Recent Labs Lab 06/01/12 1050 06/03/12 0115 06/05/12 0330  AST 12 56* 37  ALT 11 15 13   ALKPHOS 56  --  48  BILITOT 1.2 2.2* 1.2  PROT 5.9*  --  5.5*  ALBUMIN 3.1*  --  2.7*   No results found for this basename: LIPASE, AMYLASE,  in the last 168 hours No results found for this basename: AMMONIA,  in the last 168 hours  CBC:  Recent Labs Lab 06/03/12 0115  06/03/12 1649  06/04/12 0300 06/04/12 1521 06/05/12 0330 06/06/12 0425 06/06/12 1900 06/07/12 0415  WBC 8.3  < > 10.7*  --  11.8*  --  14.1* 10.4  --  9.4  NEUTROABS 7.0  --   --   --  10.2*  --  11.9* 8.4*  --   --   HGB 9.2*  < > 9.4*  < > 8.8* 8.5* 8.6* 8.9* 10.5* 8.9*  HCT 26.1*  < > 27.0*  < > 25.8* 25.0* 24.9* 26.5* 31.0* 25.9*  MCV 84.7  < > 85.7  --  86.6  --  86.5 86.6  --  83.8  PLT 96*  < > 91*  --  82*  --  100* 114*  --  135*  < > = values in this interval not displayed.  INR:  Recent Labs Lab 06/03/12 0115 06/04/12 0300 06/05/12 0330 06/06/12 0425 06/07/12 0415  INR 1.40 1.65* 1.39 1.40 2.38*    Other results:   Imaging: Dg Chest Port 1 View  06/06/2012  *RADIOLOGY REPORT*  Clinical Data: Post left ventricular assist device placement for ischemic cardiomyopathy with acute on chronic failure  PORTABLE CHEST - 1 VIEW  Comparison: Portable exam 0616 hours compared to 06/05/2012  Findings: Left  ventricular assist device is predominately below extent of exam. Left jugular central venous catheter tip projecting over left brachiocephalic vein. Right subclavian central venous catheter tip projects over SVC. Mediastinal drain remains. Interval removal of left thoracostomy tube. Left subclavian AICD leads project over right atrium and right ventricle.  Enlargement of cardiac silhouette with pulmonary vascular congestion. Slightly increased right basilar atelectasis. Atelectasis versus consolidation left lower lobe increased. No pneumothorax.  IMPRESSION: Slightly increased right basilar atelectasis. Increased left lower lobe opacity representing atelectasis versus consolidation. Enlargement of cardiac silhouette post median sternotomy, LVAD and AICD.   Original Report Authenticated By: Ulyses Southward, M.D.      Medications:     Scheduled Medications: . aspirin EC  325 mg Oral Daily   Or  . aspirin  324 mg Per Tube Daily   Or  . aspirin  300 mg Rectal Daily  . bisacodyl  10 mg Oral Daily   Or  . bisacodyl  10 mg Rectal Daily  . docusate sodium  200 mg Oral Daily  . furosemide  40 mg Intravenous BID  . insulin aspart  0-24 Units Subcutaneous Q4H  . insulin regular  0-10 Units Intravenous TID WC  . metoCLOPramide (REGLAN) injection  10 mg Intravenous Q6H  . pantoprazole  40 mg Oral Daily  . sodium chloride  3 mL Intravenous Q12H  . Warfarin - Pharmacist Dosing Inpatient   Does not apply q1800  . Warfarin - Physician Dosing Inpatient   Does not apply q1800    Infusions: . sodium chloride 20 mL/hr at 06/07/12 0400  . DOPamine Stopped (06/06/12 1300)  . insulin (NOVOLIN-R) infusion Stopped (06/02/12 2000)  . milrinone 0.3 mcg/kg/min (06/07/12 0400)    PRN Medications: sodium chloride, bisacodyl, fentaNYL, HYDROcodone-acetaminophen, midazolam, ondansetron (ZOFRAN) IV, oxyCODONE, promethazine, sodium chloride, traMADol   Assessment:  1. 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 H/o  VT  3. NICM  4. Pneumothorax s/p L chest tube 5. Ab pain - post-op ileus   Plan/Discussion:    POD # 5  Co-ox down this am, will not restart dopamine at this time, will continue to monitor and get another co-ox at noon. Continue milrinone. Still having abdominal pain will continue reglan and ambulate. Will continue diuretics.   Patient tolerating increase in speed to 9600 and had 1 PI event overnight.   I reviewed the LVAD parameters from today, and compared the results to the patient's prior recorded data.  No programming changes were made.  The LVAD is functioning within specified parameters.  The patient/staff is performing the LVAD self-test daily.  LVAD interrogation was negative for any significant power changes, alarms or PI events/speed drops. 1 PI event. LVAD equipment check completed and is in good working order.  Back-up equipment present.   LVAD education done on emergency procedures and precautions and reviewed exit site care.  Length of Stay: 8  Robert [name] 06/07/2012, 7:36 AM  LVAD speed to 9600, stable.   Patient seen with NP, agree with the above note.  Both Co-ox readings from this morning were drawn peripherally b/c cannot withdraw from CVL.  - Place PICC - Get co-ox off PICC - Continue milrinone at current dose for now, reassess when co-ox reading is obtained  CVP 8.  Patient comfortable.  Continue IV Lasix at current dosing.   Robert [name] 06/07/2012 8:17 AM

## 2012-06-08 ENCOUNTER — Inpatient Hospital Stay (HOSPITAL_COMMUNITY): Payer: BC Managed Care – PPO

## 2012-06-08 DIAGNOSIS — Z95818 Presence of other cardiac implants and grafts: Secondary | ICD-10-CM

## 2012-06-08 DIAGNOSIS — I428 Other cardiomyopathies: Secondary | ICD-10-CM

## 2012-06-08 DIAGNOSIS — I5023 Acute on chronic systolic (congestive) heart failure: Secondary | ICD-10-CM

## 2012-06-08 LAB — LACTATE DEHYDROGENASE: LDH: 363 U/L — ABNORMAL HIGH (ref 94–250)

## 2012-06-08 LAB — PROTIME-INR
INR: 2.67 — ABNORMAL HIGH (ref 0.00–1.49)
Prothrombin Time: 27.1 seconds — ABNORMAL HIGH (ref 11.6–15.2)

## 2012-06-08 LAB — CBC
Hemoglobin: 10 g/dL — ABNORMAL LOW (ref 13.0–17.0)
MCH: 29.1 pg (ref 26.0–34.0)
MCHC: 34.4 g/dL (ref 30.0–36.0)
RDW: 13.9 % (ref 11.5–15.5)

## 2012-06-08 LAB — BASIC METABOLIC PANEL
BUN: 13 mg/dL (ref 6–23)
CO2: 28 mEq/L (ref 19–32)
Chloride: 96 mEq/L (ref 96–112)
Creatinine, Ser: 0.98 mg/dL (ref 0.50–1.35)
GFR calc Af Amer: >90 mL/min (ref >90)
Glucose, Bld: 94 mg/dL (ref 70–99)
Potassium: 3.9 mEq/L (ref 3.5–5.1)

## 2012-06-08 LAB — CARBOXYHEMOGLOBIN
Carboxyhemoglobin: 1.9 % — ABNORMAL HIGH (ref 0.5–1.5)
Methemoglobin: 1 % (ref 0.0–1.5)
Total hemoglobin: 9.6 g/dL — ABNORMAL LOW (ref 13.5–18.0)

## 2012-06-08 MED ORDER — COLCHICINE 0.6 MG PO TABS
0.6000 mg | ORAL_TABLET | Freq: Two times a day (BID) | ORAL | Status: DC
  Administered 2012-06-08 – 2012-06-10 (×6): 0.6 mg via ORAL
  Filled 2012-06-08 (×8): qty 1

## 2012-06-08 MED ORDER — ALLOPURINOL 300 MG PO TABS
300.0000 mg | ORAL_TABLET | Freq: Every day | ORAL | Status: DC
  Administered 2012-06-08 – 2012-06-16 (×9): 300 mg via ORAL
  Filled 2012-06-08 (×9): qty 1

## 2012-06-08 MED ORDER — WARFARIN SODIUM 2 MG PO TABS
2.0000 mg | ORAL_TABLET | Freq: Once | ORAL | Status: AC
  Administered 2012-06-08: 2 mg via ORAL
  Filled 2012-06-08: qty 1

## 2012-06-08 MED ORDER — FUROSEMIDE 10 MG/ML IJ SOLN: 40.0000 mg | Freq: Once | INTRAMUSCULAR | Status: DC

## 2012-06-08 NOTE — Progress Notes (Signed)
Physical Therapy Treatment Patient Details Name: Robert [name] MRN: [id number] DOB: 02-Sep-1955 Today's Date: 06/08/2012 Time: 5366-4403 PT Time Calculation (min): 41 min  PT Assessment / Plan / Recommendation Comments on Treatment Session  Pt s/p LVAD.  Continues to progress ambulation distance daily.  Desats without O2 with activity.  Needs continued PT to address endurance for activity.      Follow Up Recommendations  Home health PT;Supervision/Assistance - 24 hour                 Equipment Recommendations  Other (comment) (rollator)        Frequency Min 5X/week   Plan Discharge plan remains appropriate;Frequency remains appropriate    Precautions / Restrictions Precautions Precautions: Sternal;Fall Precaution Comments: LVAD, chest tube Restrictions Weight Bearing Restrictions: No   Pertinent Vitals/Pain Desat to 87% on RA with activity, replaced 2LO2 with sat >90%.   No pain    Mobility  Bed Mobility Bed Mobility: Rolling Left;Left Sidelying to Sit;Sitting - Scoot to Edge of Bed Rolling Right: Not tested (comment) Rolling Left: 4: Min guard;With rail Right Sidelying to Sit: Not tested (comment) Left Sidelying to Sit: 4: Min guard;HOB elevated Sitting - Scoot to Edge of Bed: 4: Min guard Sit to Supine: Not Tested (comment) Sit to Sidelying Left: Not Tested (comment) Details for Bed Mobility Assistance: Progressing and needs less assist to get to EOB each day.  Needs cues for sternal precautions.  Manipulated equipment independently with exception of assist with vest due to lines.    Only cued to get the back up battery bag.   Transfers Transfers: Sit to Stand;Stand to Sit Sit to Stand: 5: Supervision;From bed;Without upper extremity assist Stand to Sit: 5: Supervision;Without upper extremity assist;To chair/3-in-1 Stand Pivot Transfers: Not tested (comment) Details for Transfer Assistance: Pt did not use hands today. Ambulation/Gait Ambulation/Gait Assistance: 4:  Min guard Ambulation Distance (Feet): 350 Feet Assistive device: Rolling walker Ambulation/Gait Assistance Details:   No cues needed again today with good pace.  Still needed 2L O2 for ambulation to keep sats >90%.  Encouraged incentive spirometer upon return to room.   Gait Pattern: Step-through pattern;Decreased stride length Gait velocity: decreased Stairs: No Wheelchair Mobility Wheelchair Mobility: No    PT Goals Acute Rehab PT Goals PT Goal: Supine/Side to Sit - Progress: Progressing toward goal PT Goal: Sit to Stand - Progress: Progressing toward goal PT Goal: Ambulate - Progress: Progressing toward goal Additional Goals PT Goal: Additional Goal #1 - Progress: Progressing toward goal PT Goal: Additional Goal #2 - Progress: Progressing toward goal  Visit Information  Last PT Received On: 06/08/12 Assistance Needed: +1    Subjective Data  Subjective: "I am glad that last chest tube is out."   Cognition  Cognition Overall Cognitive Status: Appears within functional limits for tasks assessed/performed Arousal/Alertness: Awake/alert Orientation Level: Appears intact for tasks assessed Behavior During Session: [facility] for tasks performed    Balance  Static Sitting Balance Static Sitting - Level of Assistance: Not tested (comment) Static Standing Balance Static Standing - Balance Support: Bilateral upper extremity supported;During functional activity Static Standing - Level of Assistance: 5: Stand by assistance Static Standing - Comment/# of Minutes: 3 minutes with RW with good postural stability.    End of Session PT - End of Session Equipment Utilized During Treatment: Gait belt;Oxygen Activity Tolerance: Patient tolerated treatment well Patient left: in chair;with call bell/phone within reach;with nursing in room Nurse Communication: Mobility status        INGOLD,DAWN  06/08/2012, 1:08 PM [facility] Acute Rehabilitation [phone] (pager)

## 2012-06-08 NOTE — Progress Notes (Signed)
HeartMate 2 Rounding Note  Subjective:    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  Ambulating hall. Ab pain improved and had BM yesterday.  Complaining of gout pain in R great toe. Denies SOB/DOE or CP. Weight down another 2 lbs.   Co-ox 54.  INR 2.67  CVP 6   LVAD INTERROGATION:  HeartMate II LVAD: Flow --- liters/min, speed 9580, power 5.3, PI 7.1    Objective:    Vital Signs:   Temp:  [98.2 F (36.8 C)-98.9 F (37.2 C)] 98.2 F (36.8 C) (03/06 0750) Pulse Rate:  [69-147] 114 (03/06 0800) Resp:  [18-35] 22 (03/06 0800) SpO2:  [92 %-100 %] 97 % (03/06 0800) Weight:  [94.7 kg (208 lb 12.4 oz)] 94.7 kg (208 lb 12.4 oz) (03/06 0600) Last BM Date: 06/08/12 Mean arterial Pressure 80's  Intake/Output:   Intake/Output Summary (Last 24 hours) at 06/08/12 0959 Last data filed at 06/08/12 0800  Gross per 24 hour  Intake 1585.5 ml  Output   2436 ml  Net -850.5 ml     Physical Exam: General:  Sitting in chair;  No resp difficulty HEENT: normal Neck: supple. JVP 8-9  Carotids 2+ bilat; no bruits. No lymphadenopathy or thryomegaly appreciated.   Cor:  LVAD hum present with soft heart sounds Lungs: clear, + Chest tubes Abdomen: soft, nontender, + distended. + bowel sounds.  No hepatosplenomegaly. No bruits or masses. Securement device intact and dressing C/D/I Extremities: no cyanosis, clubbing, rash, tr edema Neuro: alert & orientedx3, cranial nerves grossly intact. moves all 4 extremities w/o difficulty. Affect pleasant  Telemetry: ST 100-110  Labs: Basic Metabolic Panel:  Recent Labs Lab 06/04/12 0300  06/05/12 0330 06/06/12 0425 06/06/12 1900 06/07/12 0415 06/07/12 1544 06/08/12 0500  NA 134*  < > 137 137 136 137 134* 136  K 3.9  < > 3.8 3.6 3.8 4.1 3.9 3.9  CL 101  < > 104 101 97 100 96 96  CO2 26  --  28 28  --  30  --  28  GLUCOSE  115*  < > 102* 87 126* 89 128* 94  BUN 11  < > 15 15 16 15 13 13   CREATININE 0.89  < > 0.89 0.87 1.00 0.98 0.90 0.98  CALCIUM 8.6  --  8.7 8.6  --  8.6  --  9.0  MG 2.2  --  2.3 2.0  --  2.0  --  2.0  PHOS 4.1  --  4.2 3.7  --  3.4  --  3.6  < > = values in this interval not displayed.  Liver Function Tests:  Recent Labs Lab 06/01/12 1050 06/03/12 0115 06/05/12 0330  AST 12 56* 37  ALT 11 15 13   ALKPHOS 56  --  48  BILITOT 1.2 2.2* 1.2  PROT 5.9*  --  5.5*  ALBUMIN 3.1*  --  2.7*   No results found for this basename: LIPASE, AMYLASE,  in the last 168 hours No results found for this basename: AMMONIA,  in the last 168 hours  CBC:  Recent Labs Lab 06/03/12 0115  06/04/12 0300  06/05/12 0330 06/06/12 0425 06/06/12 1900 06/07/12 0415 06/07/12 1544 06/08/12 0500  WBC 8.3  < > 11.8*  --  14.1* 10.4  --  9.4  --  9.9  NEUTROABS 7.0  --  10.2*  --  11.9* 8.4*  --   --   --   --   HGB 9.2*  < > 8.8*  < > 8.6* 8.9* 10.5* 8.9* 9.9* 10.0*  HCT 26.1*  < > 25.8*  < > 24.9* 26.5* 31.0* 25.9* 29.0* 29.1*  MCV 84.7  < > 86.6  --  86.5 86.6  --  83.8  --  84.6  PLT 96*  < > 82*  --  100* 114*  --  135*  --  173  < > = values in this interval not displayed.  INR:  Recent Labs Lab 06/04/12 0300 06/05/12 0330 06/06/12 0425 06/07/12 0415 06/08/12 0500  INR 1.65* 1.39 1.40 2.38* 2.67*    Other results:   Imaging: Dg Chest Port 1 View  06/08/2012  *RADIOLOGY REPORT*  Clinical Data: Status post LVAD  PORTABLE CHEST - 1 VIEW  Comparison: 06/07/2012  Findings: There is a right arm PICC line with tip terminating in the cavoatrial junction.  There is a left subclavian ICD with leads in the LVAD is again noted.  No change in cardiomegaly. Atelectasis is noted in the right base.  Left base opacity unchanged.  IMPRESSION:  1.  No change in aeration to the lung bases compared with previous exam.   Original Report Authenticated By: Signa Kell, M.D.    Dg Chest Port 1 View  06/07/2012   *RADIOLOGY REPORT*  Clinical Data: PICC line placement  PORTABLE CHEST - 1 VIEW  Comparison: 06/08/2038 at 0825 hours  Findings: Right arm PICC terminates at the cavoatrial junction.  Otherwise unchanged.  Right subclavian venous catheter.  Left subclavian ICD.  LVAD.  Cardiomegaly.  Bibasilar opacities.  No pneumothorax.  IMPRESSION: Right arm PICC terminates at the cavoatrial junction.  Otherwise unchanged.   Original Report Authenticated By: Charline Bills, M.D.    Dg Chest Port 1 View  06/07/2012  *RADIOLOGY REPORT*  Clinical Data: Status post left ventricular assist device placement.  Evaluate chest tube.  PORTABLE CHEST - 1 VIEW  Comparison: Chest x-ray 06/06/2012.  Findings: The inflow cannula for the left ventricular assist device is incompletely visualized, but appears to be directed in a similar fashion the tip approximately 15 degrees medially directed from the true vertical relative to the patient, directed towards the apex of the left ventricle.  Allowing for slight differences in patient positioning, this is similar to the recent examination from 06/06/2012.  Left-sided pacemaker/AICD in place with lead tips projecting in the expected location of the right atrium and right ventricular apex. There is a right-sided subclavian central venous catheter with tip terminating in the superior cavoatrial junction. Lung volumes are low.  Persistent bibasilar opacities (left greater than right) favored to reflect the bibasilar areas of atelectasis. The reported chest tube is not confidently identified on this plain film examination.  No definite pneumothorax.  Probable small left pleural effusion.  No evidence of pulmonary edema.  Moderate enlargement of the cardiopericardial silhouette is similar to the prior examination in addition to the preprocedural study.  Upper mediastinal contours are within normal limits. Status post median sternotomy.  IMPRESSION: 1.  Support apparatus and postoperative changes, as  above. 2.  Low lung volumes with probable bibasilar subsegmental atelectasis and small left pleural effusion. 3.  No definite chest tube identified.  No pneumothorax at this time.   Original Report Authenticated By: Trudie Reed, M.D.      Medications:     Scheduled Medications: . allopurinol  300 mg  Oral Daily  . aspirin EC  325 mg Oral Daily   Or  . aspirin  324 mg Per Tube Daily   Or  . aspirin  300 mg Rectal Daily  . colchicine  0.6 mg Oral BID  . docusate sodium  200 mg Oral Daily  . lisinopril  5 mg Oral Daily  . metoCLOPramide (REGLAN) injection  10 mg Intravenous Q6H  . pantoprazole  40 mg Oral Daily  . sodium chloride  10-40 mL Intracatheter Q12H  . sodium chloride  3 mL Intravenous Q12H  . Warfarin - Pharmacist Dosing Inpatient   Does not apply q1800    Infusions: . sodium chloride 20 mL/hr at 06/08/12 0400  . milrinone 0.3 mcg/kg/min (06/08/12 0802)    PRN Medications: sodium chloride, bisacodyl, fentaNYL, HYDROcodone-acetaminophen, midazolam, ondansetron (ZOFRAN) IV, oxyCODONE, promethazine, sodium chloride, sodium chloride, traMADol   Assessment:   1. 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 H/o VT  3. NICM  4. Pneumothorax s/p L chest tube 5. Ab pain - post-op ileus   Plan/Discussion:    POD # 6  Continues to improve. Co-ox remains marginal. Will continue milrinone. Plan limited echo tomorrow to reassess LV unloading and RV function. Would cut lasix back. Hopefully can got to 2000 today if [facility] with surgical team. INR therapeutic.   I reviewed the LVAD parameters from today, and compared the results to the patient's prior recorded data.  No programming changes were made.  The LVAD is functioning within specified parameters.  The patient/staff is performing the LVAD self-test daily.  LVAD interrogation was negative for any significant power changes, alarms or PI events/speed drops. 1 PI event. LVAD equipment check completed and is in good working order.   Back-up equipment present.   LVAD education done on emergency procedures and precautions and reviewed exit site care.  Length of Stay: 9  Robert [name] 06/08/2012, 9:59 AM  LVAD speed to 9600, stable.   Patient seen with NP, agree with the above note.  Both Co-ox readings from this morning were drawn peripherally b/c cannot withdraw from CVL.  - Place PICC - Get co-ox off PICC - Continue milrinone at current dose for now, reassess when co-ox reading is obtained  CVP 8.  Patient comfortable.  Continue IV Lasix at current dosing.   Robert [name] 06/08/2012 9:59 AM

## 2012-06-08 NOTE — Progress Notes (Signed)
VAD Coordinator Note:  VAD Teaching Note:  Patient received VAD teaching and education today at the bedside on the HM II system operation.  Discharge date is hopefully end next week. The patient's primary and secondary caregiver (mother and sister) plan on getting trained on the dressing change next Monday.  We went over the topics below and I will be following up with him tomorrow for re-education and to go over alarms and VAD emergencies.  1) What back-up equipment needs to be with him at  all times.  2) How to take care of his driveline and equipment.  3) The restrictions he has with having the pump.  4) How to change power sources.  5) How to call the VAD pager 6) What the follow up schedule consists of   Discharge information materials given to patient include the VAD Booklet along with alarm cards and an educational CD.

## 2012-06-08 NOTE — Progress Notes (Addendum)
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 Cath 2011 showed normal coronaries. He also has a history of VTach.   He is followed very closely in Heart Failure Clinic and In December was referred to the Transplant Clinic at [facility] as his symptoms began to worsen, we were unable to titrate meds due to hypotension and frequent NSVT. He underwent transplant evaluation, RHC showed low output with high filling pressures. He was diuresed and placed on home inotropes. He is on the transplant list and was having worsening HF symptoms when he was admitted to the hospital for bridge VAD on 05/30/12/  LVAD POD #6 Patient stable overnight. Ambulating well in the halls. Patient had BM yesterday and his abdominal pain is much better. Complaining of gout pain in R great toe. Denies SOB/DOE or CP. Weight down another 2 lbs. Co-ox remains low at 54.  INR 2.67 CVP 6  LVAD INTERROGATION:  HeartMate II LVAD:  Flow --- liters/min, speed 9580, power 5.3, PI 7.1  Objective:    Vital Signs:   Temp:  [98.2 F (36.8 C)-98.9 F (37.2 C)] 98.8 F (37.1 C) (03/06 0400) Pulse Rate:  [69-147] 124 (03/06 0700) Resp:  [18-35] 26 (03/06 0700) SpO2:  [92 %-100 %] 95 % (03/06 0700) Weight:  [208 lb 12.4 oz (94.7 kg)] 208 lb 12.4 oz (94.7 kg) (03/06 0600) Last BM Date: 06/01/12 Mean arterial Pressure 72-86  Intake/Output:   Intake/Output Summary (Last 24 hours) at 06/08/12 0725 Last data filed at 06/08/12 0700  Gross per 24 hour  Intake   1704 ml  Output   2746 ml  Net  -1042 ml     Physical Exam: General: Sitting on side of bed; No resp difficulty HEENT: normal Neck: supple. JVP 8-9 . Carotids 2+ bilat; no bruits. No lymphadenopathy or thryomegaly appreciated.; Cor: Mechanical heart sounds with LVAD hum present, with  soft heart sounds Lungs: clear, diminshed in the bases; + pocket drain Abdomen: soft, tender, not distended. No hepatosplenomegaly. No bruits or masses. + bowel sounds. Securement device intact and dressing C/D/I Extremities: no cyanosis, clubbing, rash, edema, R UE 2L PICC Neuro: alert & orientedx3, cranial nerves grossly intact. moves all 4 extremities w/o difficulty. Affect pleasant  Telemetry: ST 120s  Labs: Basic Metabolic Panel:  Recent Labs Lab 06/04/12 0300  06/05/12 0330 06/06/12 0425 06/06/12 1900 06/07/12 0415 06/07/12 1544 06/08/12 0500  NA 134*  < > 137 137 136 137 134* 136  K 3.9  < > 3.8 3.6 3.8 4.1 3.9 3.9  CL 101  < > 104 101 97 100 96 96  CO2 26  --  28 28  --  30  --  28  GLUCOSE 115*  < > 102* 87 126* 89 128* 94  BUN 11  < > 15 15 16 15 13 13   CREATININE 0.89  < > 0.89 0.87 1.00 0.98 0.90 0.98  CALCIUM 8.6  --  8.7 8.6  --  8.6  --  9.0  MG 2.2  --  2.3 2.0  --  2.0  --  2.0  PHOS 4.1  --  4.2 3.7  --  3.4  --  3.6  < > = values in this interval not displayed.  Liver Function Tests:  Recent Labs Lab 06/01/12  1050 06/03/12 0115 06/05/12 0330  AST 12 56* 37  ALT 11 15 13   ALKPHOS 56  --  48  BILITOT 1.2 2.2* 1.2  PROT 5.9*  --  5.5*  ALBUMIN 3.1*  --  2.7*   No results found for this basename: LIPASE, AMYLASE,  in the last 168 hours No results found for this basename: AMMONIA,  in the last 168 hours  CBC:  Recent Labs Lab 06/03/12 0115  06/04/12 0300  06/05/12 0330 06/06/12 0425 06/06/12 1900 06/07/12 0415 06/07/12 1544 06/08/12 0500  WBC 8.3  < > 11.8*  --  14.1* 10.4  --  9.4  --  9.9  NEUTROABS 7.0  --  10.2*  --  11.9* 8.4*  --   --   --   --   HGB 9.2*  < > 8.8*  < > 8.6* 8.9* 10.5* 8.9* 9.9* 10.0*  HCT 26.1*  < > 25.8*  < > 24.9* 26.5* 31.0* 25.9* 29.0* 29.1*  MCV 84.7  < > 86.6  --  86.5 86.6  --  83.8  --  84.6  PLT 96*  < > 82*  --  100* 114*  --  135*  --  173  < > = values in this interval not displayed.  INR:  Recent  Labs Lab 06/04/12 0300 06/05/12 0330 06/06/12 0425 06/07/12 0415 06/08/12 0500  INR 1.65* 1.39 1.40 2.38* 2.67*    Other results:  EKG:   Imaging: Dg Chest Port 1 View  06/07/2012  *RADIOLOGY REPORT*  Clinical Data: PICC line placement  PORTABLE CHEST - 1 VIEW  Comparison: 06/08/2038 at 0825 hours  Findings: Right arm PICC terminates at the cavoatrial junction.  Otherwise unchanged.  Right subclavian venous catheter.  Left subclavian ICD.  LVAD.  Cardiomegaly.  Bibasilar opacities.  No pneumothorax.  IMPRESSION: Right arm PICC terminates at the cavoatrial junction.  Otherwise unchanged.   Original Report Authenticated By: Charline Bills, M.D.    Dg Chest Port 1 View  06/07/2012  *RADIOLOGY REPORT*  Clinical Data: Status post left ventricular assist device placement.  Evaluate chest tube.  PORTABLE CHEST - 1 VIEW  Comparison: Chest x-ray 06/06/2012.  Findings: The inflow cannula for the left ventricular assist device is incompletely visualized, but appears to be directed in a similar fashion the tip approximately 15 degrees medially directed from the true vertical relative to the patient, directed towards the apex of the left ventricle.  Allowing for slight differences in patient positioning, this is similar to the recent examination from 06/06/2012.  Left-sided pacemaker/AICD in place with lead tips projecting in the expected location of the right atrium and right ventricular apex. There is a right-sided subclavian central venous catheter with tip terminating in the superior cavoatrial junction. Lung volumes are low.  Persistent bibasilar opacities (left greater than right) favored to reflect the bibasilar areas of atelectasis. The reported chest tube is not confidently identified on this plain film examination.  No definite pneumothorax.  Probable small left pleural effusion.  No evidence of pulmonary edema.  Moderate enlargement of the cardiopericardial silhouette is similar to the prior  examination in addition to the preprocedural study.  Upper mediastinal contours are within normal limits. Status post median sternotomy.  IMPRESSION: 1.  Support apparatus and postoperative changes, as above. 2.  Low lung volumes with probable bibasilar subsegmental atelectasis and small left pleural effusion. 3.  No definite chest tube identified.  No pneumothorax at this time.   Original Report Authenticated By:  Trudie Reed, M.D.      Medications:     Scheduled Medications: . aspirin EC  325 mg Oral Daily   Or  . aspirin  324 mg Per Tube Daily   Or  . aspirin  300 mg Rectal Daily  . bisacodyl  10 mg Oral Daily   Or  . bisacodyl  10 mg Rectal Daily  . docusate sodium  200 mg Oral Daily  . furosemide  40 mg Intravenous BID  . lisinopril  5 mg Oral Daily  . metoCLOPramide (REGLAN) injection  10 mg Intravenous Q6H  . pantoprazole  40 mg Oral Daily  . sodium chloride  10-40 mL Intracatheter Q12H  . sodium chloride  3 mL Intravenous Q12H  . Warfarin - Pharmacist Dosing Inpatient   Does not apply q1800    Infusions: . sodium chloride 20 mL/hr at 06/08/12 0400  . DOPamine Stopped (06/06/12 1300)  . milrinone 0.3 mcg/kg/min (06/08/12 0400)    PRN Medications: sodium chloride, bisacodyl, fentaNYL, HYDROcodone-acetaminophen, midazolam, ondansetron (ZOFRAN) IV, oxyCODONE, promethazine, sodium chloride, sodium chloride, traMADol   Assessment:   1. 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 H/o VT  3. NICM  4. Pneumothorax s/p L chest tube 5. Subtherapeutic INR 6. Abdominal pain 7. Protein Calorie Malnutrition  Plan/Discussion:    POD # 6. Continues to do well and progressing well. Patient experiencing gout pain will use oxycodone for pain. CVP 6, will change IV diuretics to once today and try to transition to PO tomorrow. Pocket drain out 20 cc over last 24 hours will discontinue today. Co-ox remains decreased 54 on 0.3 milrinone, will continue. INR therapeutic 2.67. Will  begin extensive patient education today.  I reviewed the LVAD parameters from today, and compared the results to the patient's prior recorded data.  No programming changes were made.  The LVAD is functioning within specified parameters.  The patient is performing the LVAD self-test daily.  LVAD interrogation was negative for any significant power changes, alarms or PI events/speed drops.  LVAD equipment check completed and is in good working order.  Back-up equipment present.   LVAD education done on emergency procedures and precautions and reviewed exit site care.  Length of Stay: [address]  Robert [name] 06/08/2012, 7:25 AM

## 2012-06-08 NOTE — Progress Notes (Signed)
Will cont low dose molrinone ( For RV) and transfer to stepdown  Echo/ pump speed study before the weekend

## 2012-06-08 NOTE — Progress Notes (Signed)
ANTICOAGULATION CONSULT NOTE - Follow Up Consult  Pharmacy Consult for warfarin  Indication: LVAD  No Known Allergies  Patient Measurements: Height: 6\' 2"  (188 cm) Weight: 208 lb 12.4 oz (94.7 kg) IBW/kg (Calculated) : 82.2 Heparin Dosing Weight: 96.6kg  Vital Signs: Temp: 98.2 F (36.8 C) (03/06 0750) Temp src: Oral (03/06 0750) Pulse Rate: 120 (03/06 1000)  Labs:  Recent Labs  06/06/12 0425 06/06/12 1659  06/07/12 0018 06/07/12 0415 06/07/12 1544 06/08/12 0500  HGB 8.9*  --   < >  --  8.9* 9.9* 10.0*  HCT 26.5*  --   < >  --  25.9* 29.0* 29.1*  PLT 114*  --   --   --  135*  --  173  LABPROT 16.8*  --   --   --  24.9*  --  27.1*  INR 1.40  --   --   --  2.38*  --  2.67*  HEPARINUNFRC  --  0.16*  --  0.20*  --   --   --   CREATININE 0.87  --   < >  --  0.98 0.90 0.98  < > = values in this interval not displayed.  Estimated Creatinine Clearance: 97.9 ml/min (by C-G formula based on Cr of 0.98).  Assessment: 56yom s/p LVAD 2/28 POD#6  now off heparin gtt in setting of sudden rise in INR that is now therapeutic. INR 1.39>>1.4>>2.38>2.6 seems to have plateaued. No bleeding issues noted. No issues with LVAD pump noted overnight. Pateint appears to be very sensitive to warfarin therapy, baseline coagulapathy seems to be resolving. Will give 2mg  dose tonight and titrate up in am as deemed by INR.  Plan to start warfarin education tomorrow 3/7.  Goal of Therapy:  Monitor platelets by anticoagulation protocol: Yes INR goal 2-3   Plan:  1.Warfarin 2mg  tonight 2.Continue daily INR 3.Monitor for signs and symptoms of bleeding 4.Warfarin education once stable out of ICU  Severiano Gilbert 06/08/2012,11:00 AM

## 2012-06-09 ENCOUNTER — Inpatient Hospital Stay (HOSPITAL_COMMUNITY): Payer: BC Managed Care – PPO

## 2012-06-09 DIAGNOSIS — I428 Other cardiomyopathies: Secondary | ICD-10-CM

## 2012-06-09 DIAGNOSIS — Z95818 Presence of other cardiac implants and grafts: Secondary | ICD-10-CM

## 2012-06-09 DIAGNOSIS — I509 Heart failure, unspecified: Secondary | ICD-10-CM

## 2012-06-09 DIAGNOSIS — I5023 Acute on chronic systolic (congestive) heart failure: Secondary | ICD-10-CM

## 2012-06-09 LAB — BASIC METABOLIC PANEL
Chloride: 92 mEq/L — ABNORMAL LOW (ref 96–112)
Creatinine, Ser: 0.89 mg/dL (ref 0.50–1.35)
GFR calc Af Amer: >90 mL/min (ref >90)
Sodium: 133 mEq/L — ABNORMAL LOW (ref 135–145)

## 2012-06-09 LAB — CBC
Hemoglobin: 9.9 g/dL — ABNORMAL LOW (ref 13.0–17.0)
MCV: 84.6 fL (ref 78.0–100.0)
Platelets: 240 10*3/uL (ref 150–400)
RBC: 3.44 MIL/uL — ABNORMAL LOW (ref 4.22–5.81)
WBC: 15.2 10*3/uL — ABNORMAL HIGH (ref 4.0–10.5)

## 2012-06-09 LAB — ALT: ALT: 16 U/L (ref 0–53)

## 2012-06-09 LAB — PRO B NATRIURETIC PEPTIDE: Pro B Natriuretic peptide (BNP): 4703 pg/mL — ABNORMAL HIGH (ref 0–125)

## 2012-06-09 LAB — CARBOXYHEMOGLOBIN
Carboxyhemoglobin: 2 % — ABNORMAL HIGH (ref 0.5–1.5)
O2 Saturation: 58.9 %

## 2012-06-09 LAB — AST: AST: 21 U/L (ref 0–37)

## 2012-06-09 LAB — BILIRUBIN, TOTAL: Total Bilirubin: 1.2 mg/dL (ref 0.3–1.2)

## 2012-06-09 MED ORDER — WARFARIN 0.5 MG HALF TABLET
3.5000 mg | ORAL_TABLET | Freq: Once | ORAL | Status: AC
  Administered 2012-06-09: 3.5 mg via ORAL
  Filled 2012-06-09: qty 1

## 2012-06-09 MED ORDER — WARFARIN VIDEO
Freq: Once | Status: AC
  Administered 2012-06-10: 13:00:00

## 2012-06-09 MED ORDER — POTASSIUM CHLORIDE CRYS ER 20 MEQ PO TBCR
40.0000 meq | EXTENDED_RELEASE_TABLET | Freq: Every day | ORAL | Status: DC
  Administered 2012-06-09 – 2012-06-11 (×3): 40 meq via ORAL
  Filled 2012-06-09: qty 2
  Filled 2012-06-09: qty 1
  Filled 2012-06-09 (×2): qty 2

## 2012-06-09 MED ORDER — FUROSEMIDE 10 MG/ML IJ SOLN
40.0000 mg | Freq: Every day | INTRAMUSCULAR | Status: DC
  Filled 2012-06-09: qty 4

## 2012-06-09 MED ORDER — PATIENT'S GUIDE TO USING COUMADIN BOOK
Freq: Once | Status: DC
  Filled 2012-06-09: qty 1

## 2012-06-09 MED ORDER — IOHEXOL 300 MG/ML  SOLN
25.0000 mL | INTRAMUSCULAR | Status: AC
Start: 2012-06-09 — End: 2012-06-09
  Administered 2012-06-09: 25 mL via ORAL

## 2012-06-09 MED ORDER — METOPROLOL SUCCINATE ER 25 MG PO TB24
25.0000 mg | ORAL_TABLET | Freq: Two times a day (BID) | ORAL | Status: DC
  Administered 2012-06-09 – 2012-06-10 (×4): 25 mg via ORAL
  Filled 2012-06-09 (×6): qty 1

## 2012-06-09 MED ORDER — METOCLOPRAMIDE HCL 10 MG PO TABS
10.0000 mg | ORAL_TABLET | Freq: Three times a day (TID) | ORAL | Status: DC
  Administered 2012-06-09 – 2012-06-10 (×5): 10 mg via ORAL
  Filled 2012-06-09 (×11): qty 1

## 2012-06-09 MED ORDER — FUROSEMIDE 10 MG/ML IJ SOLN
40.0000 mg | Freq: Two times a day (BID) | INTRAMUSCULAR | Status: DC
  Administered 2012-06-09: 40 mg via INTRAVENOUS
  Filled 2012-06-09: qty 4

## 2012-06-09 MED ORDER — KETOROLAC TROMETHAMINE 30 MG/ML IJ SOLN
30.0000 mg | Freq: Once | INTRAMUSCULAR | Status: AC
  Administered 2012-06-09: 30 mg via INTRAVENOUS
  Filled 2012-06-09: qty 1

## 2012-06-09 NOTE — Progress Notes (Signed)
RAMP ECHO   Speed     Flow       PI      Power     LVIDD     AI          MR         AoV                         Septum  9600          4.8       6.5       5.7          7.9 cm     Mild     Trivial    Open 1/4 beats     Bowing slight right  9800          5.0       6.3       6.2          7.7 cm     Mild     Trivial           Rare                       Ok  57846       5.2       5.8       6.4          7.8 cm     Mild     Trivial           Rare                       Ok  96295        5.3       6.1       6.8          7.5 cm     Mild     Trivial           No                   Slight pull to Left   Controller and back-up set at 10000 with lower rate of 9400.   Study performed under direct supervision in conjunction with the echo sonographer and Ulla Potash.  Daniel Bensimhon,MD 11:52 AM

## 2012-06-09 NOTE — Progress Notes (Signed)
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 Cath 2011 showed normal coronaries. He also has a history of VTach.   He is followed very closely in Heart Failure Clinic and In December was referred to the Transplant Clinic at [facility] as his symptoms began to worsen, we were unable to titrate meds due to hypotension and frequent NSVT. He underwent transplant evaluation, RHC showed low output with high filling pressures. He was diuresed and placed on home inotropes. He is on the transplant list and was having worsening HF symptoms when he was admitted to the hospital for bridge VAD on 05/30/12/  LVAD POD #6 Patient stable overnight. Ambulating well in the halls. Patient had BM yesterday and his abdominal pain is much better. Complaining of gout pain in R great toe. Denies SOB/DOE or CP. Weightstable Co-ox  Improved at 59%, cont low dose milrinone for RV dysfunction  CXR shows min atelectasis @ L base, no sig effusion, good cannula position on Lateral view EPW's still in place with elevated INR- remove prior to DC   INR 2.8 coumadin per PharmD  LVAD INTERROGATION:  HeartMate II LVAD:  Flow5.0liters/min, speed 9600, power 5.3, PI 6.5  Objective:    Vital Signs:   Temp:  [97.8 F (36.6 C)-99.6 F (37.6 C)] 98.7 F (37.1 C) (03/07 0620) Pulse Rate:  [27-126] 124 (03/07 0620) Resp:  [16-22] 22 (03/07 0620) SpO2:  [96 %-98 %] 97 % (03/07 0620) Weight:  [210 lb 1.6 oz (95.301 kg)] 210 lb 1.6 oz (95.301 kg) (03/07 0424) Last BM Date: 06/08/12 Mean arterial Pressure 72-86  Intake/Output:   Intake/Output Summary (Last 24 hours) at 06/09/12 1322 Last data filed at 06/08/12 1920  Gross per 24 hour  Intake  138.5 ml  Output    300 ml  Net -161.5 ml     Physical Exam: Urine appears darker than usual- U/A  with trace bilirubin and blood- recent hx of R flank pain- recheck liver panel and follow LDH which have been satisfactory, may need renal ultrasound  R foot remains warm and tender with mild edema- gout Surgical incisions dry and clean  General: Sitting on side of bed; No resp difficulty HEENT: normal Neck: supple. JVP 8-9 . Carotids 2+ bilat; no bruits. No lymphadenopathy or thryomegaly appreciated.; Cor: Mechanical heart sounds with LVAD hum present, with soft heart sounds Lungs: clear, diminshed in the bases; + pocket drain Abdomen: soft, tender, not distended. No hepatosplenomegaly. No bruits or masses. + bowel sounds. Securement device intact and dressing C/D/I Extremities: no cyanosis, clubbing, rash, edema, R UE 2L PICC Neuro: alert & orientedx3, cranial nerves grossly intact. moves all 4 extremities w/o difficulty. Affect pleasant  Telemetry: ST 120s  Labs: Basic Metabolic Panel:  Recent Labs Lab 06/04/12 0300  06/05/12 0330 06/06/12 0425 06/06/12 1900 06/07/12 0415 06/07/12 1544 06/08/12 0500 06/09/12 0617  NA 134*  < > 137 137 136 137 134* 136 133*  K 3.9  < > 3.8 3.6 3.8 4.1 3.9 3.9 3.5  CL 101  < > 104 101 97 100 96 96 92*  CO2 26  --  28 28  --  30  --  28 28  GLUCOSE 115*  < > 102* 87 126* 89 128* 94 114*  BUN 11  < >  15 15 16 15 13 13 11   CREATININE 0.89  < > 0.89 0.87 1.00 0.98 0.90 0.98 0.89  CALCIUM 8.6  --  8.7 8.6  --  8.6  --  9.0 9.1  MG 2.2  --  2.3 2.0  --  2.0  --  2.0  --   PHOS 4.1  --  4.2 3.7  --  3.4  --  3.6  --   < > = values in this interval not displayed.  Liver Function Tests:  Recent Labs Lab 06/03/12 0115 06/05/12 0330 06/09/12 0617  AST 56* 37 21  ALT 15 13 16   ALKPHOS  --  48  --   BILITOT 2.2* 1.2 1.2  PROT  --  5.5*  --   ALBUMIN  --  2.7*  --    No results found for this basename: LIPASE, AMYLASE,  in the last 168 hours No results found for this basename: AMMONIA,  in the last 168 hours  CBC:  Recent Labs Lab  06/03/12 0115  06/04/12 0300  06/05/12 0330 06/06/12 0425 06/06/12 1900 06/07/12 0415 06/07/12 1544 06/08/12 0500 06/09/12 0617  WBC 8.3  < > 11.8*  --  14.1* 10.4  --  9.4  --  9.9 15.2*  NEUTROABS 7.0  --  10.2*  --  11.9* 8.4*  --   --   --   --   --   HGB 9.2*  < > 8.8*  < > 8.6* 8.9* 10.5* 8.9* 9.9* 10.0* 9.9*  HCT 26.1*  < > 25.8*  < > 24.9* 26.5* 31.0* 25.9* 29.0* 29.1* 29.1*  MCV 84.7  < > 86.6  --  86.5 86.6  --  83.8  --  84.6 84.6  PLT 96*  < > 82*  --  100* 114*  --  135*  --  173 240  < > = values in this interval not displayed.  INR:  Recent Labs Lab 06/05/12 0330 06/06/12 0425 06/07/12 0415 06/08/12 0500 06/09/12 0617  INR 1.39 1.40 2.38* 2.67* 2.29*    Other results:  EKG:   Imaging: Dg Chest 2 View  06/09/2012  *RADIOLOGY REPORT*  Clinical Data: Left ventricular assist device placement.  Chest soreness.  CHEST - 2 VIEW  Comparison: 06/08/2012 and 06/07/2012.  Findings: The left ventricular assist device appears unchanged. Left subclavian AICD and right arm PICC are unchanged.  There is stable cardiomegaly.  Overall basilar aeration has improved with mild residual atelectasis.  There is no edema, significant pleural effusion or pneumothorax.  IMPRESSION:  1.  Stable appearance of the support system, including the left ventricular assist device. 2.  Improved bibasilar atelectasis.   Original Report Authenticated By: Carey Bullocks, M.D.    Dg Chest Port 1 View  06/08/2012  *RADIOLOGY REPORT*  Clinical Data: Status post LVAD  PORTABLE CHEST - 1 VIEW  Comparison: 06/07/2012  Findings: There is a right arm PICC line with tip terminating in the cavoatrial junction.  There is a left subclavian ICD with leads in the LVAD is again noted.  No change in cardiomegaly. Atelectasis is noted in the right base.  Left base opacity unchanged.  IMPRESSION:  1.  No change in aeration to the lung bases compared with previous exam.   Original Report Authenticated By: Signa Kell,  M.D.      Medications:     Scheduled Medications: . allopurinol  300 mg Oral Daily  . aspirin EC  325 mg Oral Daily  Or  . aspirin  324 mg Per Tube Daily   Or  . aspirin  300 mg Rectal Daily  . colchicine  0.6 mg Oral BID  . docusate sodium  200 mg Oral Daily  . [START ON 06/10/2012] furosemide  40 mg Intravenous Daily  . ketorolac  30 mg Intravenous Once  . lisinopril  5 mg Oral Daily  . metoprolol succinate  25 mg Oral BID  . pantoprazole  40 mg Oral Daily  . potassium chloride  40 mEq Oral Daily  . sodium chloride  3 mL Intravenous Q12H  . warfarin  3.5 mg Oral ONCE-1800  . Warfarin - Pharmacist Dosing Inpatient   Does not apply q1800    Infusions: . milrinone 0.3 mcg/kg/min (06/09/12 1026)    PRN Medications: sodium chloride, bisacodyl, fentaNYL, HYDROcodone-acetaminophen, ondansetron (ZOFRAN) IV, oxyCODONE, promethazine, sodium chloride, traMADol   Assessment:   1. 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 H/o VT  3. NICM  4. Pneumothorax s/p R chest tube 5. Subtherapeutic INR- resolved 6. Abdominal pain- resolved 7. Protein Calorie Malnutrition 8. Acute gout flareup R foot 9. Dark urine w/o change in LDH  Plan/Discussion:    POD # 6. Continues to do well and progressing well. Patient experiencing gout pain will use oxycodone for pain and give 1 dose iv toradol, cont colchicine,allopurinol  Reduce diuretics Wean off milrinone slowly and check ramp study to optimize speed Check LFTs in am   reviewed the LVAD parameters from today, and compared the results to the patient's prior recorded data.  No programming changes were made.  The LVAD is functioning within specified parameters.  The patient is performing the LVAD self-test daily.  LVAD interrogation was negative for any significant power changes, alarms or PI events/speed drops.  LVAD equipment check completed and is in good working order.  Back-up equipment present.   LVAD education done on emergency  procedures and precautions and reviewed exit site care.  Length of Stay: 10  Robert [name],Robert [name] 06/09/2012, 1:22 PM

## 2012-06-09 NOTE — Progress Notes (Signed)
1500 Cardiac Rehab Came to see pt. Piedad Climes NP there. She states that pt is going for CT .scan to hold ambulation for now. Melina Copa RN

## 2012-06-09 NOTE — Progress Notes (Signed)
Physical Therapy Treatment Patient Details Name: Robert [name] MRN: [id number] DOB: 1956-03-21 Today's Date: 06/09/2012 Time: 4098-1191 PT Time Calculation (min): 38 min  PT Assessment / Plan / Recommendation Comments on Treatment Session  Pt s/p LVAD.  Continues to progress ambulation even though he has right foot pain today.  Did not need O2 today.  Continue PT.  Pt did have more difficulty manipulating equipment today needing cues as he seemed distracted internally.      Follow Up Recommendations  Home health PT;Supervision/Assistance - 24 hour                 Equipment Recommendations  Other (comment) (rollator)        Frequency Min 3X/week   Plan Discharge plan remains appropriate;Frequency needs to be updated;Other (comment) (pt decr to 3x week now that share with cardiac rehab)    Precautions / Restrictions Precautions Precautions: Sternal;Fall Precaution Comments: LVAD, chest tube Restrictions Weight Bearing Restrictions: No   Pertinent Vitals/Pain VSS, right foot gout pain    Mobility  Bed Mobility Bed Mobility: Rolling Left;Left Sidelying to Sit;Sitting - Scoot to [place] of Bed Rolling Left: 4: Min guard Right Sidelying to Sit: Not tested (comment) Left Sidelying to Sit: 4: Min guard;HOB elevated Sitting - Scoot to Edge of Bed: 4: Min guard Sit to Supine: Not Tested (comment) Sit to Sidelying Left: Not Tested (comment) Details for Bed Mobility Assistance: Progressing and needs less assist to get to EOB each day.  Needs cues for sternal precautions.  Needed cues to Manipulate equipment today for placing batteries in clips first prior to disconnecting.  Pt seemed distracted today ? secondary to gout pain.    Only cued to get the back up battery bag.  Reviewed performance of self test with pt for system controller and power module with pt performing both with cues.   Transfers Transfers: Sit to Stand;Stand to Sit Sit to Stand: 5: Supervision;Without upper extremity  assist Stand to Sit: 5: Supervision;Without upper extremity assist;To chair/3-in-1 Stand Pivot Transfers: Not tested (comment) Details for Transfer Assistance: Pt needed bed elevated secondary to pain in right LE limiting ability for sit to stand.   Ambulation/Gait Ambulation/Gait Assistance: 4: Min guard Ambulation Distance (Feet): 360 Feet Assistive device: Rolling walker Ambulation/Gait Assistance Details: No cues needed with good pace despite gout pain right foot.  Did not need O2 today.  Took 2 standing rest breaks.   Gait Pattern: Step-through pattern;Decreased stride length Gait velocity: decreased Stairs: No Wheelchair Mobility Wheelchair Mobility: No    PT Goals Acute Rehab PT Goals PT Goal: Supine/Side to Sit - Progress: Progressing toward goal PT Goal: Sit at [place] Of Bed - Progress: Met PT Goal: Sit to Stand - Progress: Progressing toward goal PT Goal: Ambulate - Progress: Progressing toward goal Additional Goals PT Goal: Additional Goal #1 - Progress: Progressing toward goal PT Goal: Additional Goal #2 - Progress: Progressing toward goal  Visit Information  Last PT Received On: 06/09/12 Assistance Needed: +1    Subjective Data  Subjective: " That gout is killing my right foot."   Cognition  Cognition Overall Cognitive Status: Appears within functional limits for tasks assessed/performed Arousal/Alertness: Awake/alert Orientation Level: Appears intact for tasks assessed Behavior During Session: [facility] for tasks performed    Balance  Static Sitting Balance Static Sitting - Balance Support: Feet supported;No upper extremity supported Static Sitting - Level of Assistance: 7: Independent Static Sitting - Comment/# of Minutes: 10 Static Standing Balance Static Standing - Balance Support:  Bilateral upper extremity supported;During functional activity Static Standing - Level of Assistance: 5: Stand by assistance Static Standing - Comment/# of Minutes: 3  End of  Session PT - End of Session Equipment Utilized During Treatment: Gait belt Activity Tolerance: Patient tolerated treatment well Patient left: in bed;with call bell/phone within reach Nurse Communication: Mobility status        INGOLD,DAWN 06/09/2012, 12:43 PM [facility] Acute Rehabilitation [phone] (pager)

## 2012-06-09 NOTE — Progress Notes (Signed)
HeartMate 2 Rounding Note  Subjective:    Mr. 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  Ambulating hall. Says he had a rough night due to all the noise and pumps beeping. Also low grade temp. No cough. Mild ab pain. Denies SOB/DOE or CP. Weight stable but still up 10 pounds. HR has been in 120s.  Co-ox  Pending (IV team called) INR 2.29 MAPs 80-90   LVAD INTERROGATION:  HeartMate II LVAD: Flow --- liters/min, speed 9600, power 5.4, PI 7.1 No events    Objective:    Vital Signs:   Temp:  [97.8 F (36.6 C)-99.6 F (37.6 C)] 98.7 F (37.1 C) (03/07 0620) Pulse Rate:  [27-134] 124 (03/07 0620) Resp:  [16-29] 22 (03/07 0620) SpO2:  [96 %-98 %] 97 % (03/07 0620) Weight:  [95.301 kg (210 lb 1.6 oz)] 95.301 kg (210 lb 1.6 oz) (03/07 0424) Last BM Date: 06/08/12 Mean arterial Pressure 80's  Intake/Output:   Intake/Output Summary (Last 24 hours) at 06/09/12 0830 Last data filed at 06/08/12 1920  Gross per 24 hour  Intake    631 ml  Output    600 ml  Net     31 ml     Physical Exam: General:  Sitting in chair;  No resp difficulty HEENT: normal Neck: supple. JVP 8-9  Carotids 2+ bilat; no bruits. No lymphadenopathy or thryomegaly appreciated.   Cor:  LVAD hum present with soft heart sounds Lungs: clear,  Abdomen: soft, nontender,. + bowel sounds.Securement device intact and dressing C/D/I Extremities: no cyanosis, clubbing, rash, tr-1+ edema Neuro: alert & orientedx3, cranial nerves grossly intact. moves all 4 extremities w/o difficulty. Affect pleasant  Telemetry: ST 120s  Labs: Basic Metabolic Panel:  Recent Labs Lab 06/04/12 0300  06/05/12 0330 06/06/12 0425 06/06/12 1900 06/07/12 0415 06/07/12 1544 06/08/12 0500 06/09/12 0617  NA 134*  < > 137 137 136 137 134* 136 133*  K 3.9  < > 3.8 3.6 3.8 4.1 3.9 3.9 3.5  CL 101  < > 104 101 97 100 96  96 92*  CO2 26  --  28 28  --  30  --  28 28  GLUCOSE 115*  < > 102* 87 126* 89 128* 94 114*  BUN 11  < > 15 15 16 15 13 13 11   CREATININE 0.89  < > 0.89 0.87 1.00 0.98 0.90 0.98 0.89  CALCIUM 8.6  --  8.7 8.6  --  8.6  --  9.0 9.1  MG 2.2  --  2.3 2.0  --  2.0  --  2.0  --   PHOS 4.1  --  4.2 3.7  --  3.4  --  3.6  --   < > = values in this interval not displayed.  Liver Function Tests:  Recent Labs Lab 06/03/12 0115 06/05/12 0330 06/09/12 0617  AST 56* 37 21  ALT 15 13 16   ALKPHOS  --  48  --   BILITOT 2.2* 1.2 1.2  PROT  --  5.5*  --   ALBUMIN  --  2.7*  --    No results found for this basename: LIPASE, AMYLASE,  in the last 168 hours No results found for this basename: AMMONIA,  in the last 168 hours  CBC:  Recent Labs Lab 06/03/12 0115  06/04/12 0300  06/05/12 0330 06/06/12 0425 06/06/12 1900 06/07/12 0415 06/07/12 1544 06/08/12 0500 06/09/12 0617  WBC 8.3  < > 11.8*  --  14.1* 10.4  --  9.4  --  9.9 15.2*  NEUTROABS 7.0  --  10.2*  --  11.9* 8.4*  --   --   --   --   --   HGB 9.2*  < > 8.8*  < > 8.6* 8.9* 10.5* 8.9* 9.9* 10.0* 9.9*  HCT 26.1*  < > 25.8*  < > 24.9* 26.5* 31.0* 25.9* 29.0* 29.1* 29.1*  MCV 84.7  < > 86.6  --  86.5 86.6  --  83.8  --  84.6 84.6  PLT 96*  < > 82*  --  100* 114*  --  135*  --  173 240  < > = values in this interval not displayed.  INR:  Recent Labs Lab 06/05/12 0330 06/06/12 0425 06/07/12 0415 06/08/12 0500 06/09/12 0617  INR 1.39 1.40 2.38* 2.67* 2.29*    Other results:   Imaging: Dg Chest 2 View  06/09/2012  *RADIOLOGY REPORT*  Clinical Data: Left ventricular assist device placement.  Chest soreness.  CHEST - 2 VIEW  Comparison: 06/08/2012 and 06/07/2012.  Findings: The left ventricular assist device appears unchanged. Left subclavian AICD and right arm PICC are unchanged.  There is stable cardiomegaly.  Overall basilar aeration has improved with mild residual atelectasis.  There is no edema, significant pleural  effusion or pneumothorax.  IMPRESSION:  1.  Stable appearance of the support system, including the left ventricular assist device. 2.  Improved bibasilar atelectasis.   Original Report Authenticated By: Carey Bullocks, M.D.    Dg Chest Port 1 View  06/08/2012  *RADIOLOGY REPORT*  Clinical Data: Status post LVAD  PORTABLE CHEST - 1 VIEW  Comparison: 06/07/2012  Findings: There is a right arm PICC line with tip terminating in the cavoatrial junction.  There is a left subclavian ICD with leads in the LVAD is again noted.  No change in cardiomegaly. Atelectasis is noted in the right base.  Left base opacity unchanged.  IMPRESSION:  1.  No change in aeration to the lung bases compared with previous exam.   Original Report Authenticated By: Signa Kell, M.D.    Dg Chest Port 1 View  06/07/2012  *RADIOLOGY REPORT*  Clinical Data: PICC line placement  PORTABLE CHEST - 1 VIEW  Comparison: 06/08/2038 at 0825 hours  Findings: Right arm PICC terminates at the cavoatrial junction.  Otherwise unchanged.  Right subclavian venous catheter.  Left subclavian ICD.  LVAD.  Cardiomegaly.  Bibasilar opacities.  No pneumothorax.  IMPRESSION: Right arm PICC terminates at the cavoatrial junction.  Otherwise unchanged.   Original Report Authenticated By: Charline Bills, M.D.      Medications:     Scheduled Medications: . allopurinol  300 mg Oral Daily  . aspirin EC  325 mg Oral Daily   Or  . aspirin  324 mg Per Tube Daily   Or  . aspirin  300 mg Rectal Daily  . colchicine  0.6 mg Oral BID  . docusate sodium  200 mg Oral Daily  . lisinopril  5 mg Oral Daily  . pantoprazole  40 mg Oral Daily  . sodium chloride  3 mL Intravenous Q12H  . warfarin  3.5 mg Oral ONCE-1800  . Warfarin - Pharmacist Dosing Inpatient   Does not apply q1800    Infusions: . milrinone 0.3 mcg/kg/min (06/08/12 2126)    PRN Medications:  sodium chloride, bisacodyl, fentaNYL, HYDROcodone-acetaminophen, ondansetron (ZOFRAN) IV, oxyCODONE,  promethazine, sodium chloride, traMADol   Assessment:   1. 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 H/o VT  3. NICM  4. Pneumothorax s/p L chest tube 5. Hyponatremia/hypokalemia   Plan/Discussion:    POD # 7  Continues to improve. However remains volume overloaded and tachycardic. Will increase diuretic and interrogate ICD to make sure it is sinus. May add back low-dose b-blocker as long as co-ox not dropping. Plan RAMP echo today. Wean milrinone gently as tolerated.  INR therapeutic. Continue ambulation. Watch fever curve (CXR ok).   I reviewed the LVAD parameters from today, and compared the results to the patient's prior recorded data.  No programming changes were made.  The LVAD is functioning within specified parameters.  The patient/staff is performing the LVAD self-test daily.  LVAD interrogation was negative for any significant power changes, alarms or PI events/speed drops. 1 PI event. LVAD equipment check completed and is in good working order.  Back-up equipment present.   LVAD education done on emergency procedures and precautions and reviewed exit site care.  Length of Stay: 10  Arvilla Meres 06/09/2012 8:30 AM

## 2012-06-09 NOTE — Progress Notes (Signed)
ANTICOAGULATION CONSULT NOTE - Follow Up Consult  Pharmacy Consult for warfarin  Indication: LVAD  No Known Allergies  Patient Measurements: Height: 6\' 2"  (188 cm) Weight: 210 lb 1.6 oz (95.301 kg) IBW/kg (Calculated) : 82.2 Heparin Dosing Weight: 96.6kg  Vital Signs: Temp: 98.7 F (37.1 C) (03/07 0620) Temp src: Oral (03/07 0620) Pulse Rate: 124 (03/07 0620)  Labs:  Recent Labs  06/06/12 1659  06/07/12 0018 06/07/12 0415 06/07/12 1544 06/08/12 0500 06/09/12 0617  HGB  --   < >  --  8.9* 9.9* 10.0*  --   HCT  --   < >  --  25.9* 29.0* 29.1*  --   PLT  --   --   --  135*  --  173  --   LABPROT  --   --   --  24.9*  --  27.1* 24.2*  INR  --   --   --  2.38*  --  2.67* 2.29*  HEPARINUNFRC 0.16*  --  0.20*  --   --   --   --   CREATININE  --   < >  --  0.98 0.90 0.98 0.89  < > = values in this interval not displayed.  Estimated Creatinine Clearance: 107.8 ml/min (by C-G formula based on Cr of 0.89).  Assessment: 56yom s/p LVAD 2/28 POD#7  now off heparin gtt in setting of sudden rise in INR that is now stabilizing. INR 1.39>>1.4>>2.38>2.6>2.2  plateaued. No bleeding issues noted. No issues with LVAD pump noted overnight. Pateint appears to be very sensitive to warfarin therapy, baseline coagulapathy seems to be resolving. Will give 3.5mg  dose tonight and titrate as deemed by INR in am.  Plan to start warfarin education today 3/7.  Goal of Therapy:  Monitor platelets by anticoagulation protocol: Yes INR goal 2-3   Plan:  1.Warfarin 3.5mg  tonight 2.Continue daily INR 3.Monitor for signs and symptoms of bleeding 4.Warfarin education now that patient is out of ICU  Robert [name] 06/09/2012,7:42 AM

## 2012-06-09 NOTE — Progress Notes (Signed)
Changed dressing on drive line

## 2012-06-09 NOTE — Progress Notes (Addendum)
OT Cancellation Note  Treatment cancelled today due to patient receiving procedure. Will re-attempt as time allows.  06/09/2012 Cipriano Mile OTR/L Pager [phone] Office [phone]

## 2012-06-09 NOTE — Progress Notes (Signed)
  Echocardiogram 2D Echocardiogram (RAMP study) has been performed.  CHUNG, KWONG 06/09/2012, 2:05 PM

## 2012-06-10 LAB — COMPREHENSIVE METABOLIC PANEL
ALT: 25 U/L (ref 0–53)
AST: 30 U/L (ref 0–37)
Albumin: 2.7 g/dL — ABNORMAL LOW (ref 3.5–5.2)
CO2: 29 mEq/L (ref 19–32)
Calcium: 8.6 mg/dL (ref 8.4–10.5)
Chloride: 95 mEq/L — ABNORMAL LOW (ref 96–112)
Creatinine, Ser: 0.97 mg/dL (ref 0.50–1.35)
GFR calc non Af Amer: >90 mL/min (ref >90)
Sodium: 133 mEq/L — ABNORMAL LOW (ref 135–145)

## 2012-06-10 LAB — CBC
MCH: 28.8 pg (ref 26.0–34.0)
MCV: 83.7 fL (ref 78.0–100.0)
Platelets: 220 10*3/uL (ref 150–400)
RBC: 3.13 MIL/uL — ABNORMAL LOW (ref 4.22–5.81)
RDW: 14.1 % (ref 11.5–15.5)
WBC: 15.3 10*3/uL — ABNORMAL HIGH (ref 4.0–10.5)

## 2012-06-10 LAB — PROTIME-INR: INR: 2.23 — ABNORMAL HIGH (ref 0.00–1.49)

## 2012-06-10 LAB — LACTATE DEHYDROGENASE: LDH: 416 U/L — ABNORMAL HIGH (ref 94–250)

## 2012-06-10 MED ORDER — TORSEMIDE 20 MG PO TABS
20.0000 mg | ORAL_TABLET | Freq: Two times a day (BID) | ORAL | Status: DC
  Administered 2012-06-10 – 2012-06-11 (×4): 20 mg via ORAL
  Filled 2012-06-10 (×7): qty 1

## 2012-06-10 MED ORDER — LISINOPRIL 10 MG PO TABS
10.0000 mg | ORAL_TABLET | Freq: Every day | ORAL | Status: DC
  Administered 2012-06-10 – 2012-06-13 (×4): 10 mg via ORAL
  Filled 2012-06-10 (×5): qty 1

## 2012-06-10 MED ORDER — WARFARIN SODIUM 3 MG PO TABS
3.5000 mg | ORAL_TABLET | Freq: Once | ORAL | Status: AC
  Administered 2012-06-10: 3.5 mg via ORAL
  Filled 2012-06-10: qty 1

## 2012-06-10 MED ORDER — POTASSIUM CHLORIDE 10 MEQ/50ML IV SOLN
10.0000 meq | INTRAVENOUS | Status: AC
  Administered 2012-06-10 (×2): 10 meq via INTRAVENOUS
  Filled 2012-06-10 (×2): qty 50

## 2012-06-10 MED ORDER — FE FUMARATE-B12-VIT C-FA-IFC PO CAPS
1.0000 | ORAL_CAPSULE | Freq: Three times a day (TID) | ORAL | Status: DC
  Administered 2012-06-10 – 2012-06-14 (×15): 1 via ORAL
  Filled 2012-06-10 (×16): qty 1

## 2012-06-10 NOTE — Progress Notes (Signed)
Assisted patient to bed. LVAD dressing changed using sterile technique. Site unremarkable with no s/s of infection or bleeding. Call bell near. Mamie Levers

## 2012-06-10 NOTE — Progress Notes (Signed)
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 Feels much better. CT of c/a/p nonspecific. Likely ileus. Started on Reglan.  Feels better with higher speed on VAD. Urine clearing up. Gout improving. Now able to walk. K supped.   INR 2.23 MAPs 90-92 - lisinopril increased  LVAD INTERROGATION:  HeartMate II LVAD: Flow 5.1 liters/min, speed 10K, power 6.0 , PI 5.9 Rare PI event    Objective:    Vital Signs:   Temp:  [98.6 F (37 C)-98.9 F (37.2 C)] 98.6 F (37 C) (03/08 0456) Pulse Rate:  [116] 116 (03/08 0456) Resp:  [19-20] 19 (03/08 0456) SpO2:  [95 %-96 %] 95 % (03/08 0456) Weight:  [94.3 kg (207 lb 14.3 oz)] 94.3 kg (207 lb 14.3 oz) (03/08 0500) Last BM Date: 06/08/12 Mean arterial Pressure 80's  Intake/Output:   Intake/Output Summary (Last 24 hours) at 06/10/12 0730 Last data filed at 06/10/12 0444  Gross per 24 hour  Intake    600 ml  Output    950 ml  Net   -350 ml     Physical Exam: General:  Sitting in bed;  No resp difficulty HEENT: normal Neck: supple. JVP 8-9  Carotids 2+ bilat; no bruits. No lymphadenopathy or thryomegaly appreciated.   Cor:  LVAD hum present with soft heart sounds Lungs: clear,  Abdomen: soft, nontender,. + bowel sounds.Securement device intact and dressing C/D/I Extremities: no cyanosis, clubbing, rash, tr+ edema Neuro: alert & orientedx3, cranial nerves grossly intact. moves all 4 extremities w/o difficulty. Affect pleasant  Telemetry: ST 115  Labs: Basic Metabolic Panel:  Recent Labs Lab 06/04/12 0300  06/05/12 0330 06/06/12 0425  06/07/12 0415 06/07/12 1544 06/08/12 0500 06/09/12 0617 06/10/12 0500  NA 134*  < > 137 137  < > 137 134* 136 133* 133*  K 3.9  < > 3.8 3.6  < > 4.1 3.9 3.9 3.5 3.7  CL 101  < > 104 101  < >  100 96 96 92* 95*  CO2 26  --  28 28  --  30  --  28 28 29   GLUCOSE 115*  < > 102* 87  < > 89 128* 94 114* 107*  BUN 11  < > 15 15  < > 15 13 13 11 12   CREATININE 0.89  < > 0.89 0.87  < > 0.98 0.90 0.98 0.89 0.97  CALCIUM 8.6  --  8.7 8.6  --  8.6  --  9.0 9.1 8.6  MG 2.2  --  2.3 2.0  --  2.0  --  2.0  --   --   PHOS 4.1  --  4.2 3.7  --  3.4  --  3.6  --   --   < > = values in this interval not displayed.  Liver Function Tests:  Recent Labs Lab 06/05/12 0330 06/09/12 0617 06/10/12 0500  AST 37 21 30  ALT 13 16 25   ALKPHOS 48  --  77  BILITOT 1.2 1.2 1.0  PROT 5.5*  --  5.8*  ALBUMIN 2.7*  --  2.7*   No results found for this basename: LIPASE, AMYLASE,  in the last 168 hours No results found for this basename: AMMONIA,  in the last  168 hours  CBC:  Recent Labs Lab 06/04/12 0300  06/05/12 0330 06/06/12 0425  06/07/12 0415 06/07/12 1544 06/08/12 0500 06/09/12 0617 06/10/12 0500  WBC 11.8*  --  14.1* 10.4  --  9.4  --  9.9 15.2* 15.3*  NEUTROABS 10.2*  --  11.9* 8.4*  --   --   --   --   --   --   HGB 8.8*  < > 8.6* 8.9*  < > 8.9* 9.9* 10.0* 9.9* 9.0*  HCT 25.8*  < > 24.9* 26.5*  < > 25.9* 29.0* 29.1* 29.1* 26.2*  MCV 86.6  --  86.5 86.6  --  83.8  --  84.6 84.6 83.7  PLT 82*  --  100* 114*  --  135*  --  173 240 220  < > = values in this interval not displayed.  INR:  Recent Labs Lab 06/06/12 0425 06/07/12 0415 06/08/12 0500 06/09/12 0617 06/10/12 0550  INR 1.40 2.38* 2.67* 2.29* 2.23*    Other results:   Imaging: Ct Abdomen Pelvis Wo Contrast  06/09/2012  *RADIOLOGY REPORT*  Clinical Data:  Right-sided flank pain.  Abdominal pain.  Recent left ventricular assist device placement.  Congestive heart failure.  Hypertension.  CT CHEST, ABDOMEN AND PELVIS WITHOUT  CONTRAST  Technique:  Contiguous axial images of the chest abdomen and pelvis were obtained without  IV contrast administration.  Comparison: Plain film chest 06/09/2012.  Prior CTs of 05/31/2012.   CT CHEST  Findings: Lung windows demonstrate degradation involving the lower chest and upper abdomen secondary beam hardening artifact from the assist device.  Patchy dependent right base atelectasis. No pneumothorax.  Dependent atelectasis within the left upper lobe and left lower lobe.  Soft tissue windows demonstrate pacer / AICD device with leads right atrium right ventricle.  Moderate global cardiomegaly with small pericardial effusion.   A small left-sided pleural effusion with new mild pleural calcification, including on image 41/series 2.  There is minimal fluid tracking in the left major fissure on image 21/series 2.  Ventricular assist device is identified at the left ventricular apex and entering the ascending aorta anteriorly.  Limited evaluation for mediastinal and hilar adenopathy, secondary artifact and lack of IV contrast.  Minimal air identified adjacent the tract of the assist device. Example image 40/series 2, image 43/series 2.  There is ill-defined fluid within these areas and along the course of the assist device. There may be minimal anterior left sided air versus aerated lung on image 46/series 2.  Bilateral gynecomastia.  IMPRESSION:  1.  Interval placement of left ventricular assist device.  Ill- defined fluid and minimal air seen along the tract of the device. This is indeterminate, given seven days postop.  If there is a suspicion of infection, follow-up with contrast enhanced CT may be informative. 2.  Small left pleural effusion with pleural calcifications, possibly related to interval hemothorax.  3.  Multifactorial degradation.  CT ABDOMEN AND PELVIS  Findings:  Degradation continuing in the upper abdomen, moderate in severity.  Grossly normal liver and spleen.  Underdistended stomach.  Poor evaluation of the pancreas and gallbladder.  Normal adrenal glands and kidneys.  No retroperitoneal adenopathy.  Normal colon and terminal ileum.  Normal small bowel caliber.  No pelvic  adenopathy.    Normal urinary bladder and prostate. Trace pelvic fluid. No acute osseous abnormality.  Advanced L4-L5 degenerative disc disease.  IMPRESSION:  1.  Degradation, moderate severity continuing into the upper abdomen. 2.  Trace  cul-de-sac fluid, nonspecific. 3.  Given above limitation, no specific explanation for abdominal pain.   Original Report Authenticated By: Jeronimo Greaves, M.D.    Dg Chest 2 View  06/09/2012  *RADIOLOGY REPORT*  Clinical Data: Left ventricular assist device placement.  Chest soreness.  CHEST - 2 VIEW  Comparison: 06/08/2012 and 06/07/2012.  Findings: The left ventricular assist device appears unchanged. Left subclavian AICD and right arm PICC are unchanged.  There is stable cardiomegaly.  Overall basilar aeration has improved with mild residual atelectasis.  There is no edema, significant pleural effusion or pneumothorax.  IMPRESSION:  1.  Stable appearance of the support system, including the left ventricular assist device. 2.  Improved bibasilar atelectasis.   Original Report Authenticated By: Carey Bullocks, M.D.    Ct Chest Wo Contrast  06/09/2012  *RADIOLOGY REPORT*  Clinical Data:  Right-sided flank pain.  Abdominal pain.  Recent left ventricular assist device placement.  Congestive heart failure.  Hypertension.  CT CHEST, ABDOMEN AND PELVIS WITHOUT  CONTRAST  Technique:  Contiguous axial images of the chest abdomen and pelvis were obtained without  IV contrast administration.  Comparison: Plain film chest 06/09/2012.  Prior CTs of 05/31/2012.  CT CHEST  Findings: Lung windows demonstrate degradation involving the lower chest and upper abdomen secondary beam hardening artifact from the assist device.  Patchy dependent right base atelectasis. No pneumothorax.  Dependent atelectasis within the left upper lobe and left lower lobe.  Soft tissue windows demonstrate pacer / AICD device with leads right atrium right ventricle.  Moderate global cardiomegaly with small pericardial  effusion.   A small left-sided pleural effusion with new mild pleural calcification, including on image 41/series 2.  There is minimal fluid tracking in the left major fissure on image 21/series 2.  Ventricular assist device is identified at the left ventricular apex and entering the ascending aorta anteriorly.  Limited evaluation for mediastinal and hilar adenopathy, secondary artifact and lack of IV contrast.  Minimal air identified adjacent the tract of the assist device. Example image 40/series 2, image 43/series 2.  There is ill-defined fluid within these areas and along the course of the assist device. There may be minimal anterior left sided air versus aerated lung on image 46/series 2.  Bilateral gynecomastia.  IMPRESSION:  1.  Interval placement of left ventricular assist device.  Ill- defined fluid and minimal air seen along the tract of the device. This is indeterminate, given seven days postop.  If there is a suspicion of infection, follow-up with contrast enhanced CT may be informative. 2.  Small left pleural effusion with pleural calcifications, possibly related to interval hemothorax.  3.  Multifactorial degradation.  CT ABDOMEN AND PELVIS  Findings:  Degradation continuing in the upper abdomen, moderate in severity.  Grossly normal liver and spleen.  Underdistended stomach.  Poor evaluation of the pancreas and gallbladder.  Normal adrenal glands and kidneys.  No retroperitoneal adenopathy.  Normal colon and terminal ileum.  Normal small bowel caliber.  No pelvic adenopathy.    Normal urinary bladder and prostate. Trace pelvic fluid. No acute osseous abnormality.  Advanced L4-L5 degenerative disc disease.  IMPRESSION:  1.  Degradation, moderate severity continuing into the upper abdomen. 2.  Trace cul-de-sac fluid, nonspecific. 3.  Given above limitation, no specific explanation for abdominal pain.   Original Report Authenticated By: Jeronimo Greaves, M.D.      Medications:     Scheduled  Medications: . allopurinol  300 mg Oral Daily  . aspirin EC  325 mg Oral Daily   Or  . aspirin  324 mg Per Tube Daily   Or  . aspirin  300 mg Rectal Daily  . colchicine  0.6 mg Oral BID  . docusate sodium  200 mg Oral Daily  . ferrous fumarate-b12-vitamic C-folic acid  1 capsule Oral TID PC  . furosemide  40 mg Intravenous Daily  . lisinopril  10 mg Oral Daily  . metoCLOPramide  10 mg Oral TID AC & HS  . metoprolol succinate  25 mg Oral BID  . pantoprazole  40 mg Oral Daily  . patient's guide to using coumadin book   Does not apply Once  . potassium chloride  10 mEq Intravenous Q1 Hr x 2  . potassium chloride  40 mEq Oral Daily  . sodium chloride  3 mL Intravenous Q12H  . warfarin   Does not apply Once  . Warfarin - Pharmacist Dosing Inpatient   Does not apply q1800    Infusions: . milrinone 0.2 mcg/kg/min (06/10/12 0533)    PRN Medications: sodium chloride, bisacodyl, fentaNYL, HYDROcodone-acetaminophen, ondansetron (ZOFRAN) IV, oxyCODONE, promethazine, sodium chloride, traMADol   Assessment:   1. 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 H/o VT  3. NICM  4. Pneumothorax s/p L chest tube 5. Hyponatremia/hypokalemia   Plan/Discussion:    POD # 8  Continues to improve. Looks much better on higher VAD speed today. MAPs elevated and HR still up (was sinus tach on device interrogation). Will cut milrinone to 0.125. Change diuretic to torsemide 20 bid. If MAPs still up increase b-blocker tomorrow.  WBC liekly due to gout which is improving with colchicine (got a dose of oradol, too)  Pt seen and discussed with Dr. Donata Clay.  I reviewed the LVAD parameters from today, and compared the results to the patient's prior recorded data.  No programming changes were made.  The LVAD is functioning within specified parameters.  The patient/staff is performing the LVAD self-test daily.  LVAD interrogation was negative for any significant power changes, alarms or PI events/speed  drops. 1 PI event. LVAD equipment check completed and is in good working order.  Back-up equipment present.   LVAD education done on emergency procedures and precautions and reviewed exit site care.  Length of Stay: 11  Daniel Bensimhon 06/10/2012 7:30 AM

## 2012-06-10 NOTE — Progress Notes (Signed)
Progress Note from the Palliative Medicine Team at [facility]  Subjective: patient is alert and oriented, "I feel better today, yesterday was rough"     Objective: No Known Allergies Scheduled Meds: . allopurinol  300 mg Oral Daily  . aspirin EC  325 mg Oral Daily   Or  . aspirin  324 mg Per Tube Daily   Or  . aspirin  300 mg Rectal Daily  . colchicine  0.6 mg Oral BID  . docusate sodium  200 mg Oral Daily  . ferrous fumarate-b12-vitamic C-folic acid  1 capsule Oral TID PC  . lisinopril  10 mg Oral Daily  . metoCLOPramide  10 mg Oral TID AC & HS  . metoprolol succinate  25 mg Oral BID  . pantoprazole  40 mg Oral Daily  . patient's guide to using coumadin book   Does not apply Once  . potassium chloride  10 mEq Intravenous Q1 Hr x 2  . potassium chloride  40 mEq Oral Daily  . sodium chloride  3 mL Intravenous Q12H  . torsemide  20 mg Oral BID  . warfarin   Does not apply Once  . Warfarin - Pharmacist Dosing Inpatient   Does not apply q1800   Continuous Infusions: . milrinone 0.125 mcg/kg/min (06/10/12 0806)   PRN Meds:.sodium chloride, bisacodyl, fentaNYL, HYDROcodone-acetaminophen, ondansetron (ZOFRAN) IV, oxyCODONE, promethazine, sodium chloride, traMADol  BP 90/80  Pulse 116  Temp(Src) 98.6 F (37 C) (Oral)  Resp 19  Ht 6\' 2"  (1.88 m)  Wt 94.3 kg (207 lb 14.3 oz)  BMI 26.68 kg/m2  SpO2 95%   PPS: 30 %  Pain Score: denies presetnly    Intake/Output Summary (Last 24 hours) at 06/10/12 0951 Last data filed at 06/10/12 0859  Gross per 24 hour  Intake    840 ml  Output    950 ml  Net   -110 ml      LBM: 3-5 14    Physical Exam:  General: sitting up in bed NAD HEENT:  Moist buccal membranes, no exudate Chest:   CTA CVS: LVAD hum noted Abdomen: soft NT +BS, device dressing noted dry and intact Ext: trace edema BLE Neuro: alert and oriented, equal strength X4 Psych: engaged in conversation, mood appropriate for situation, good eye contact, verbalizes  optimistic  attitude   Labs: CBC    Component Value Date/Time   WBC 15.3* 06/10/2012 0500   RBC 3.13* 06/10/2012 0500   HGB 9.0* 06/10/2012 0500   HCT 26.2* 06/10/2012 0500   PLT 220 06/10/2012 0500   MCV 83.7 06/10/2012 0500   MCH 28.8 06/10/2012 0500   MCHC 34.4 06/10/2012 0500   RDW 14.1 06/10/2012 0500   LYMPHSABS 0.8 06/06/2012 0425   MONOABS 1.0 06/06/2012 0425   EOSABS 0.2 06/06/2012 0425   BASOSABS 0.0 06/06/2012 0425    BMET    Component Value Date/Time   NA 133* 06/10/2012 0500   K 3.7 06/10/2012 0500   CL 95* 06/10/2012 0500   CO2 29 06/10/2012 0500   GLUCOSE 107* 06/10/2012 0500   BUN 12 06/10/2012 0500   CREATININE 0.97 06/10/2012 0500   CALCIUM 8.6 06/10/2012 0500   GFRNONAA >90 06/10/2012 0500   GFRAA >90 06/10/2012 0500    CMP     Component Value Date/Time   NA 133* 06/10/2012 0500   K 3.7 06/10/2012 0500   CL 95* 06/10/2012 0500   CO2 29 06/10/2012 0500   GLUCOSE 107* 06/10/2012 0500  BUN 12 06/10/2012 0500   CREATININE 0.97 06/10/2012 0500   CALCIUM 8.6 06/10/2012 0500   PROT 5.8* 06/10/2012 0500   ALBUMIN 2.7* 06/10/2012 0500   AST 30 06/10/2012 0500   ALT 25 06/10/2012 0500   ALKPHOS 77 06/10/2012 0500   BILITOT 1.0 06/10/2012 0500   GFRNONAA >90 06/10/2012 0500   GFRAA >90 06/10/2012 0500    Assessment and Plan: 1. Code Status: Partial 2. Symptoms:        Weakness: encouraged continued participation with PT and demonstrated AROM exercises  3. Psycho/Social: Continued conversation regarding present medical situation, patient is optimistic and looking forward to continued improvement, patient open to coloring book and crayons left at bedside for distraction and meditation practice.  Discussed power of positive thinking and self motivation 4. Disposition: when stable dc home   PMT will continue to support holistically  Time In Time Out Total Time Spent with Patient Total Overall Time  0930 0955 25 min 25 min    Greater than 50%  of this time was spent counseling and coordinating care related to the  above assessment and plan.   Lorinda Creed NP  Palliative Medicine Team Team Phone # [phone] Pager [phone]  1

## 2012-06-10 NOTE — Progress Notes (Addendum)
CARDIAC REHAB PHASE I   PRE:  Rate/Rhythm: 111 ST  BP:  Supine: 85 dopplered  Sitting:   Standing:    SaO2:  MODE:  Ambulation: 550 ft   POST:  Rate/Rhythem: 141 ST  BP:  Supine:   Sitting: 100 dopplered  Standing:    SaO2:  0950-1045 Assisted x 1 and used walker to ambulate. Gait steady with walker. Pt states that he feels good this morning and it felt good to walk. Pt able to walk 550 feet. BP dopplered after walk 100 and heart rate 141. Pt without c/o with walking.He took 2 standing rest stops. Pt to recliner after walk with call light in reach.  Beatrix Fetters RN

## 2012-06-10 NOTE — Progress Notes (Signed)
Patient ambulated 800 ft in hallway using front wheel walker. Ambulated with back up LVAD batteries. Returned to room w/out incidence. Changed to LVAD wall unit. Call bell and family  Near. Robert [name]

## 2012-06-10 NOTE — Progress Notes (Signed)
ANTICOAGULATION CONSULT NOTE - Follow Up Consult  Pharmacy Consult for Coumadin Indication: LVAD  Heparin Dosing Weight: 96.6 kg  Labs:  Recent Labs  06/08/12 0500 06/09/12 0617 06/10/12 0500 06/10/12 0550  HGB 10.0* 9.9* 9.0*  --   HCT 29.1* 29.1* 26.2*  --   PLT 173 240 220  --   LABPROT 27.1* 24.2*  --  23.7*  INR 2.67* 2.29*  --  2.23*  CREATININE 0.98 0.89 0.97  --    Lab Results  Component Value Date   INR 2.23* 06/10/2012   INR 2.29* 06/09/2012   INR 2.67* 06/08/2012    Medications:  Scheduled:  . allopurinol  300 mg Oral Daily  . aspirin EC  325 mg Oral Daily   Or  . aspirin  324 mg Per Tube Daily   Or  . aspirin  300 mg Rectal Daily  . colchicine  0.6 mg Oral BID  . docusate sodium  200 mg Oral Daily  . ferrous fumarate-b12-vitamic C-folic acid  1 capsule Oral TID PC  . [EXPIRED] iohexol  25 mL Oral Q1 Hr x 2  . [COMPLETED] ketorolac  30 mg Intravenous Once  . lisinopril  10 mg Oral Daily  . metoCLOPramide  10 mg Oral TID AC & HS  . metoprolol succinate  25 mg Oral BID  . pantoprazole  40 mg Oral Daily  . patient's guide to using coumadin book   Does not apply Once  . [COMPLETED] potassium chloride  10 mEq Intravenous Q1 Hr x 2  . potassium chloride  40 mEq Oral Daily  . sodium chloride  3 mL Intravenous Q12H  . torsemide  20 mg Oral BID  . [COMPLETED] warfarin  3.5 mg Oral ONCE-1800  . warfarin   Does not apply Once    Assessment:  57 y/o male POD # 8 s/p LVAD on Coumadin.  Baseline coagulopathy resolving.  INR within therapeutic range. No bleeding complications noted   Goal of Therapy:  Target INR 2-3    Plan:  Repeat Coumadin 3.5 mg today.  Monitor for bleeding complications , thrombocytopenia.  Daily INR's, CBC.    [name], [name].D 06/10/2012, 11:11 AM

## 2012-06-11 ENCOUNTER — Inpatient Hospital Stay (HOSPITAL_COMMUNITY): Payer: BC Managed Care – PPO

## 2012-06-11 LAB — PROTIME-INR
INR: 1.87 — ABNORMAL HIGH (ref 0.00–1.49)
Prothrombin Time: 20.8 seconds — ABNORMAL HIGH (ref 11.6–15.2)

## 2012-06-11 LAB — BASIC METABOLIC PANEL
BUN: 13 mg/dL (ref 6–23)
CO2: 30 mEq/L (ref 19–32)
Calcium: 8.6 mg/dL (ref 8.4–10.5)
Chloride: 95 mEq/L — ABNORMAL LOW (ref 96–112)
Creatinine, Ser: 1.06 mg/dL (ref 0.50–1.35)
GFR calc Af Amer: 89 mL/min — ABNORMAL LOW (ref >90)
GFR calc non Af Amer: 77 mL/min — ABNORMAL LOW (ref >90)
Glucose, Bld: 127 mg/dL — ABNORMAL HIGH (ref 70–99)
Potassium: 3.6 mEq/L (ref 3.5–5.1)
Sodium: 136 mEq/L (ref 135–145)

## 2012-06-11 LAB — CBC
Platelets: 297 10*3/uL (ref 150–400)
RBC: 3.43 MIL/uL — ABNORMAL LOW (ref 4.22–5.81)
RDW: 14.3 % (ref 11.5–15.5)
WBC: 16.1 10*3/uL — ABNORMAL HIGH (ref 4.0–10.5)

## 2012-06-11 LAB — URINE CULTURE
Colony Count: NO GROWTH
Culture: NO GROWTH

## 2012-06-11 MED ORDER — VANCOMYCIN HCL IN DEXTROSE 1-5 GM/200ML-% IV SOLN
1000.0000 mg | Freq: Three times a day (TID) | INTRAVENOUS | Status: DC
  Administered 2012-06-11 – 2012-06-13 (×7): 1000 mg via INTRAVENOUS
  Filled 2012-06-11 (×8): qty 200

## 2012-06-11 MED ORDER — WARFARIN SODIUM 4 MG PO TABS
4.5000 mg | ORAL_TABLET | Freq: Once | ORAL | Status: AC
  Administered 2012-06-11: 4.5 mg via ORAL
  Filled 2012-06-11: qty 1

## 2012-06-11 MED ORDER — METOPROLOL SUCCINATE ER 50 MG PO TB24
50.0000 mg | ORAL_TABLET | Freq: Two times a day (BID) | ORAL | Status: DC
  Administered 2012-06-11 – 2012-06-12 (×3): 50 mg via ORAL
  Filled 2012-06-11 (×4): qty 1

## 2012-06-11 MED ORDER — ZOLPIDEM TARTRATE 5 MG PO TABS
5.0000 mg | ORAL_TABLET | Freq: Every evening | ORAL | Status: DC | PRN
  Administered 2012-06-12 – 2012-06-14 (×2): 5 mg via ORAL
  Filled 2012-06-11 (×2): qty 1

## 2012-06-11 MED ORDER — SODIUM CHLORIDE 0.9 % IV SOLN: 250.0000 mL | INTRAVENOUS | Status: DC | PRN

## 2012-06-11 NOTE — Progress Notes (Signed)
LVAD dressing changed using sterile technique. Site unremarkable with no s/s of bleeding or infection. Patient request to remain in bed. Call bell near. Robert [name]

## 2012-06-11 NOTE — Progress Notes (Signed)
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 Main complaint is he feels a tremor all over. May also have an occasional chill. No fever. Walked 800 feet yesterday. WBC up slightly (15k->16k). Gout resolved. Cx negative to date. Weight down 4 pounds on torsemide bid.   INR 1.87 MAPs 82-92 - lisinopril increased yesterday  LVAD INTERROGATION:  HeartMate II LVAD: Flow 4.6 liters/min, speed 10K, power 6.0 , PI 6.2 Rare PI event    Objective:    Vital Signs:   Temp:  [98.2 F (36.8 C)-98.7 F (37.1 C)] 98.7 F (37.1 C) (03/09 0618) Pulse Rate:  [98-115] 105 (03/09 0618) Resp:  [16-18] 18 (03/09 0618) BP: (82-85)/(1) 82/1 mmHg (03/08 1805) SpO2:  [96 %-98 %] 98 % (03/09 0618) Weight:  [92.1 kg (203 lb 0.7 oz)] 92.1 kg (203 lb 0.7 oz) (03/09 0503) Last BM Date: 06/10/12 Mean arterial Pressure 80's  Intake/Output:   Intake/Output Summary (Last 24 hours) at 06/11/12 0817 Last data filed at 06/10/12 2251  Gross per 24 hour  Intake 1550.84 ml  Output   1100 ml  Net 450.84 ml     Physical Exam: General:  Sitting in bed;  No resp difficulty HEENT: normal Neck: supple. JVP 8-9  Carotids 2+ bilat; no bruits. No lymphadenopathy or thryomegaly appreciated.   Cor:  LVAD hum present with soft heart sounds Lungs: clear,  Abdomen: soft, nontender,. + bowel sounds.Securement device intact and dressing C/D/I Extremities: no cyanosis, clubbing, rash, tr+ edema Neuro: alert & orientedx3, cranial nerves grossly intact. moves all 4 extremities w/o difficulty. Affect pleasant. Significant tremor on FTN movements  Telemetry: ST 115  Labs: Basic Metabolic Panel:  Recent Labs Lab 06/05/12 0330 06/06/12 0425  06/07/12 0415 06/07/12 1544 06/08/12 0500 06/09/12 0617 06/10/12 0500  NA 137 137  < > 137 134* 136 133* 133*  K 3.8  3.6  < > 4.1 3.9 3.9 3.5 3.7  CL 104 101  < > 100 96 96 92* 95*  CO2 28 28  --  30  --  28 28 29   GLUCOSE 102* 87  < > 89 128* 94 114* 107*  BUN 15 15  < > 15 13 13 11 12   CREATININE 0.89 0.87  < > 0.98 0.90 0.98 0.89 0.97  CALCIUM 8.7 8.6  --  8.6  --  9.0 9.1 8.6  MG 2.3 2.0  --  2.0  --  2.0  --   --   PHOS 4.2 3.7  --  3.4  --  3.6  --   --   < > = values in this interval not displayed.  Liver Function Tests:  Recent Labs Lab 06/05/12 0330 06/09/12 0617 06/10/12 0500  AST 37 21 30  ALT 13 16 25   ALKPHOS 48  --  77  BILITOT 1.2 1.2 1.0  PROT 5.5*  --  5.8*  ALBUMIN 2.7*  --  2.7*   No results found for this basename: LIPASE, AMYLASE,  in the last 168 hours No results found for this basename: AMMONIA,  in the last 168 hours  CBC:  Recent Labs Lab 06/05/12 0330 06/06/12 0425  06/07/12 0415 06/07/12 1544 06/08/12 0500 06/09/12 0617 06/10/12 0500 06/11/12 0500  WBC 14.1* 10.4  --  9.4  --  9.9  15.2* 15.3* 16.1*  NEUTROABS 11.9* 8.4*  --   --   --   --   --   --   --   HGB 8.6* 8.9*  < > 8.9* 9.9* 10.0* 9.9* 9.0* 9.7*  HCT 24.9* 26.5*  < > 25.9* 29.0* 29.1* 29.1* 26.2* 28.4*  MCV 86.5 86.6  --  83.8  --  84.6 84.6 83.7 82.8  PLT 100* 114*  --  135*  --  173 240 220 297  < > = values in this interval not displayed.  INR:  Recent Labs Lab 06/07/12 0415 06/08/12 0500 06/09/12 0617 06/10/12 0550 06/11/12 0500  INR 2.38* 2.67* 2.29* 2.23* 1.87*    Other results:   Imaging: Ct Abdomen Pelvis Wo Contrast  06/09/2012  *RADIOLOGY REPORT*  Clinical Data:  Right-sided flank pain.  Abdominal pain.  Recent left ventricular assist device placement.  Congestive heart failure.  Hypertension.  CT CHEST, ABDOMEN AND PELVIS WITHOUT  CONTRAST  Technique:  Contiguous axial images of the chest abdomen and pelvis were obtained without  IV contrast administration.  Comparison: Plain film chest 06/09/2012.  Prior CTs of 05/31/2012.  CT CHEST  Findings: Lung windows demonstrate  degradation involving the lower chest and upper abdomen secondary beam hardening artifact from the assist device.  Patchy dependent right base atelectasis. No pneumothorax.  Dependent atelectasis within the left upper lobe and left lower lobe.  Soft tissue windows demonstrate pacer / AICD device with leads right atrium right ventricle.  Moderate global cardiomegaly with small pericardial effusion.   A small left-sided pleural effusion with new mild pleural calcification, including on image 41/series 2.  There is minimal fluid tracking in the left major fissure on image 21/series 2.  Ventricular assist device is identified at the left ventricular apex and entering the ascending aorta anteriorly.  Limited evaluation for mediastinal and hilar adenopathy, secondary artifact and lack of IV contrast.  Minimal air identified adjacent the tract of the assist device. Example image 40/series 2, image 43/series 2.  There is ill-defined fluid within these areas and along the course of the assist device. There may be minimal anterior left sided air versus aerated lung on image 46/series 2.  Bilateral gynecomastia.  IMPRESSION:  1.  Interval placement of left ventricular assist device.  Ill- defined fluid and minimal air seen along the tract of the device. This is indeterminate, given seven days postop.  If there is a suspicion of infection, follow-up with contrast enhanced CT may be informative. 2.  Small left pleural effusion with pleural calcifications, possibly related to interval hemothorax.  3.  Multifactorial degradation.  CT ABDOMEN AND PELVIS  Findings:  Degradation continuing in the upper abdomen, moderate in severity.  Grossly normal liver and spleen.  Underdistended stomach.  Poor evaluation of the pancreas and gallbladder.  Normal adrenal glands and kidneys.  No retroperitoneal adenopathy.  Normal colon and terminal ileum.  Normal small bowel caliber.  No pelvic adenopathy.    Normal urinary bladder and prostate.  Trace pelvic fluid. No acute osseous abnormality.  Advanced L4-L5 degenerative disc disease.  IMPRESSION:  1.  Degradation, moderate severity continuing into the upper abdomen. 2.  Trace cul-de-sac fluid, nonspecific. 3.  Given above limitation, no specific explanation for abdominal pain.   Original Report Authenticated By: Jeronimo Greaves, M.D.    Ct Chest Wo Contrast  06/09/2012  *RADIOLOGY REPORT*  Clinical Data:  Right-sided flank pain.  Abdominal pain.  Recent left ventricular assist device placement.  Congestive  heart failure.  Hypertension.  CT CHEST, ABDOMEN AND PELVIS WITHOUT  CONTRAST  Technique:  Contiguous axial images of the chest abdomen and pelvis were obtained without  IV contrast administration.  Comparison: Plain film chest 06/09/2012.  Prior CTs of 05/31/2012.  CT CHEST  Findings: Lung windows demonstrate degradation involving the lower chest and upper abdomen secondary beam hardening artifact from the assist device.  Patchy dependent right base atelectasis. No pneumothorax.  Dependent atelectasis within the left upper lobe and left lower lobe.  Soft tissue windows demonstrate pacer / AICD device with leads right atrium right ventricle.  Moderate global cardiomegaly with small pericardial effusion.   A small left-sided pleural effusion with new mild pleural calcification, including on image 41/series 2.  There is minimal fluid tracking in the left major fissure on image 21/series 2.  Ventricular assist device is identified at the left ventricular apex and entering the ascending aorta anteriorly.  Limited evaluation for mediastinal and hilar adenopathy, secondary artifact and lack of IV contrast.  Minimal air identified adjacent the tract of the assist device. Example image 40/series 2, image 43/series 2.  There is ill-defined fluid within these areas and along the course of the assist device. There may be minimal anterior left sided air versus aerated lung on image 46/series 2.  Bilateral  gynecomastia.  IMPRESSION:  1.  Interval placement of left ventricular assist device.  Ill- defined fluid and minimal air seen along the tract of the device. This is indeterminate, given seven days postop.  If there is a suspicion of infection, follow-up with contrast enhanced CT may be informative. 2.  Small left pleural effusion with pleural calcifications, possibly related to interval hemothorax.  3.  Multifactorial degradation.  CT ABDOMEN AND PELVIS  Findings:  Degradation continuing in the upper abdomen, moderate in severity.  Grossly normal liver and spleen.  Underdistended stomach.  Poor evaluation of the pancreas and gallbladder.  Normal adrenal glands and kidneys.  No retroperitoneal adenopathy.  Normal colon and terminal ileum.  Normal small bowel caliber.  No pelvic adenopathy.    Normal urinary bladder and prostate. Trace pelvic fluid. No acute osseous abnormality.  Advanced L4-L5 degenerative disc disease.  IMPRESSION:  1.  Degradation, moderate severity continuing into the upper abdomen. 2.  Trace cul-de-sac fluid, nonspecific. 3.  Given above limitation, no specific explanation for abdominal pain.   Original Report Authenticated By: Jeronimo Greaves, M.D.      Medications:     Scheduled Medications: . allopurinol  300 mg Oral Daily  . aspirin EC  325 mg Oral Daily   Or  . aspirin  324 mg Per Tube Daily   Or  . aspirin  300 mg Rectal Daily  . colchicine  0.6 mg Oral BID  . docusate sodium  200 mg Oral Daily  . ferrous fumarate-b12-vitamic C-folic acid  1 capsule Oral TID PC  . lisinopril  10 mg Oral Daily  . metoCLOPramide  10 mg Oral TID AC & HS  . metoprolol succinate  25 mg Oral BID  . pantoprazole  40 mg Oral Daily  . patient's guide to using coumadin book   Does not apply Once  . potassium chloride  40 mEq Oral Daily  . sodium chloride  3 mL Intravenous Q12H  . torsemide  20 mg Oral BID  . Warfarin - Pharmacist Dosing Inpatient   Does not apply q1800    Infusions: .  milrinone 0.125 mcg/kg/min (06/11/12 0459)    PRN Medications: sodium  chloride, bisacodyl, fentaNYL, HYDROcodone-acetaminophen, ondansetron (ZOFRAN) IV, oxyCODONE, promethazine, sodium chloride, traMADol   Assessment:   1. 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 H/o VT  3. NICM  4. Pneumothorax s/p L chest tube 5. Hyponatremia/hypokalemia 6. Tremor  Plan/Discussion:    POD # 9  Continues to improve. Looks much better on higher VAD speed today.  Will stop milrinone.   Suspect tremor likely due to extrapyramidal side effect from Reglan. Will stop. However given rising white count and question of chills will treat with Vancomycin for a few days pending final cx results.  Volume looks good. Will continue torsemide 20 bid today and likely cut back to daily tomorrow.   MAPs and HR up still increase Toprol slightly.  Pt seen and discussed with Dr. Donata Clay.  I reviewed the LVAD parameters from today, and compared the results to the patient's prior recorded data.  No programming changes were made.  The LVAD is functioning within specified parameters.  The patient/staff is performing the LVAD self-test daily.  LVAD interrogation was negative for any significant power changes, alarms or PI events/speed drops. 1 PI event. LVAD equipment check completed and is in good working order.  Back-up equipment present.   LVAD education done on emergency procedures and precautions and reviewed exit site care.  Length of Stay: 91  Arvilla Meres 06/11/2012 8:17 AM

## 2012-06-11 NOTE — Progress Notes (Signed)
ANTIBIOTIC CONSULT NOTE - INITIAL  Pharmacy Consult for vancomycin Indication: Empiric therapy  No Known Allergies  Patient Measurements: Height: 6\' 2"  (188 cm) Weight: 203 lb 0.7 oz (92.1 kg) IBW/kg (Calculated) : 82.2  Vital Signs: Temp: 98.7 F (37.1 C) (03/09 0618) Temp src: Oral (03/09 0618) Pulse Rate: 105 (03/09 0618) Intake/Output from previous day: 03/08 0701 - 03/09 0700 In: 1550.8 [P.O.:680; I.V.:870.8] Out: 1100 [Urine:1100] Intake/Output from this shift:    Labs:  Recent Labs  06/09/12 0617 06/10/12 0500 06/11/12 0500  WBC 15.2* 15.3* 16.1*  HGB 9.9* 9.0* 9.7*  PLT 240 220 297  CREATININE 0.89 0.97  --    Estimated Creatinine Clearance: 98.9 ml/min (by C-G formula based on Cr of 0.97). No results found for this basename: VANCOTROUGH, Leodis Binet, VANCORANDOM, GENTTROUGH, GENTPEAK, GENTRANDOM, TOBRATROUGH, TOBRAPEAK, TOBRARND, AMIKACINPEAK, AMIKACINTROU, AMIKACIN,  in the last 72 hours   Microbiology: Recent Results (from the past 720 hour(s))  MRSA PCR SCREENING     Status: None   Collection Time    05/30/12  8:53 PM      Result Value Range Status   MRSA by PCR NEGATIVE  NEGATIVE Final   Comment:            The GeneXpert MRSA Assay (FDA     approved for NASAL specimens     only), is one component of a     comprehensive MRSA colonization     surveillance program. It is not     intended to diagnose MRSA     infection nor to guide or     monitor treatment for     MRSA infections.  SURGICAL PCR SCREEN     Status: None   Collection Time    05/31/12 10:25 AM      Result Value Range Status   MRSA, PCR NEGATIVE  NEGATIVE Final   Staphylococcus aureus NEGATIVE  NEGATIVE Final   Comment:            The Xpert SA Assay (FDA     approved for NASAL specimens     in patients over 46 years of age),     is one component of     a comprehensive surveillance     program.  Test performance has     been validated by [employer] for patients greater   than or equal to 71 year old.     It is not intended     to diagnose infection nor to     guide or monitor treatment.  SURGICAL PCR SCREEN     Status: Abnormal   Collection Time    06/01/12  4:40 PM      Result Value Range Status   MRSA, PCR INVALID RESULTS, SPECIMEN SENT FOR CULTURE (*) NEGATIVE Final   Comment: KUFFOUR,M RN AT [id number] [id number] [employer]   Staphylococcus aureus INVALID RESULTS, SPECIMEN SENT FOR CULTURE (*) NEGATIVE Final   Comment: KUFFOUR,M RN AT [id number] [employer]                The Xpert SA Assay (FDA     approved for NASAL specimens     in patients over 54 years of age),     is one component of     a comprehensive surveillance     program.  Test performance has     been validated by [employer] for patients greater  than or equal to 54 year old.     It is not intended     to diagnose infection nor to     guide or monitor treatment.  MRSA CULTURE     Status: None   Collection Time    06/01/12  4:40 PM      Result Value Range Status   Specimen Description NASOPHARYNGEAL   Final   Special Requests ORDERED AT [id number] ON [id number]   Final   Culture     Final   Value: NO STAPHYLOCOCCUS AUREUS ISOLATED     Note: NOMRSA   Report Status 06/04/2012 FINAL   Final    Medical History: Past Medical History  Diagnosis Date  . AICD (automatic cardioverter/defibrillator) present   . Hypertension   . Congestive heart failure   . Pneumonia   . Bronchitis    Assessment: 57 YOM s/p IABP placed on 2/26 and LVAD on 2/28. Now wbc trending up, pharmacy is consulted to start vancomycin for empiric coverage. Scr 0.97, est. crcl ~ 100 ml/min, urine and blood cultures are pending   Goal of Therapy:  Vancomycin trough level 15-20 mcg/ml  Plan:  - Vancomycin 1g IV Q 8hrs - f/u renal function and clinical course - vancomycin trough at steady state.  Bayard Hugger, PharmD, BCPS  Clinical Pharmacist  Pager: [phone]   06/11/2012,9:15 AM

## 2012-06-11 NOTE — Progress Notes (Signed)
ANTICOAGULATION CONSULT NOTE - Follow Up Consult  Pharmacy Consult for Coumadin Indication: s/p LVAD  Patient Weight: 92.1 kg  Labs:  Recent Labs  06/09/12 0617 06/10/12 0500 06/10/12 0550 06/11/12 0500 06/11/12 0900  HGB 9.9* 9.0*  --  9.7*  --   HCT 29.1* 26.2*  --  28.4*  --   PLT 240 220  --  297  --   LABPROT 24.2*  --  23.7* 20.8*  --   INR 2.29*  --  2.23* 1.87*  --   CREATININE 0.89 0.97  --   --  1.06   Lab Results  Component Value Date   INR 1.87* 06/11/2012   INR 2.23* 06/10/2012   INR 2.29* 06/09/2012    Medications:  Scheduled:  . allopurinol  300 mg Oral Daily  . aspirin EC  325 mg Oral Daily   Or  . aspirin  324 mg Per Tube Daily   Or  . aspirin  300 mg Rectal Daily  . docusate sodium  200 mg Oral Daily  . ferrous fumarate-b12-vitamic C-folic acid  1 capsule Oral TID PC  . lisinopril  10 mg Oral Daily  . metoprolol succinate  50 mg Oral BID  . pantoprazole  40 mg Oral Daily  . patient's guide to using coumadin book   Does not apply Once  . potassium chloride  40 mEq Oral Daily  . sodium chloride  3 mL Intravenous Q12H  . torsemide  20 mg Oral BID  . vancomycin  1,000 mg Intravenous Q8H  . [COMPLETED] warfarin  3.5 mg Oral ONCE-1800  . [COMPLETED] warfarin   Does not apply Once  . Warfarin - Pharmacist Dosing Inpatient   Does not apply q1800  . [DISCONTINUED] colchicine  0.6 mg Oral BID  . [DISCONTINUED] metoCLOPramide  10 mg Oral TID AC & HS  . [DISCONTINUED] metoprolol succinate  25 mg Oral BID    Assessment:  57 y/o male POD # 9 s/p LVAD on Coumadin. Baseline coagulopathy resolving.  INR now just below therapeutic goal.  Patient very sensitive to small dose changes of Coumadin, therefore will increase Coumadin conservatively.  No bleeding complications noted   Goal of Therapy:  INR 2-3\    Plan:  Coumadin 4.5 mg today  Daily INR's, CBC.   Demetric Parslow, [name].D 06/11/2012, 11:16 AM

## 2012-06-11 NOTE — Progress Notes (Addendum)
HeartMate 2 Rounding Note  Subjective:    Mr. 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 Cath 2011 showed normal coronaries. He also has a history of VTach.   He is followed very closely in Heart Failure Clinic and In December was referred to the Transplant Clinic at [facility] as his symptoms began to worsen, we were unable to titrate meds due to hypotension and frequent NSVT. He underwent transplant evaluation, RHC showed low output with high filling pressures. He was diuresed and placed on home inotropes. He is on the transplant list and was having worsening HF symptoms when he was admitted to the hospital for bridge VAD on 05/30/12/  LVAD POD #9 Patient stable overnight. Ambulating well in the hall-800 feet. Patient had BM yesterday and his abdominal pain is much better. Gout pain imroved significantly in great toe. Denies SOB/DOE or CP.. . CXR today with clear lungs and no pleural effusions.Weight  almost at  preop. Co-ox  Improved at 59%, speed increased to 10,000 RPM with min opening of Ao valve and good RV function. Will stop milrinone and follow CO-OX in am. No PI events  WBC remains elevated now 16k- no fever and cultures all neg. Surgical incision and PICC all clean and dry. Vanco started until cultures return final report   EPW's still in place with elevated INR- remove prior to DC as well as chest tube sutures.   INR 1.8     coumadin per PharmD  LVAD INTERROGATION:  HeartMate II LVAD:  Flow5.0liters/min, speed 10,000 power 5.3, PI 6.5  Objective:    Vital Signs:   Temp:  [98.2 F (36.8 C)-98.7 F (37.1 C)] 98.7 F (37.1 C) (03/09 0618) Pulse Rate:  [98-115] 105 (03/09 0618) Resp:  [16-18] 18 (03/09 0618) BP: (82-85)/(1) 82/1 mmHg (03/08 1805) SpO2:  [96 %-98 %] 98 % (03/09 0618) Weight:  [203 lb 0.7 oz (92.1 kg)] 203 lb 0.7 oz (92.1  kg) (03/09 0503) Last BM Date: 06/10/12 Mean arterial Pressure 72-86  Intake/Output:   Intake/Output Summary (Last 24 hours) at 06/11/12 0839 Last data filed at 06/10/12 2251  Gross per 24 hour  Intake 1550.84 ml  Output   1100 ml  Net 450.84 ml     Physical Exam:  Feels well except tremor - prob side effect of reglan Surgical incisions dry and clean, lungs clear PICC line clean and dry  General: Sitting on side of bed; No resp difficulty HEENT: normal Neck: supple. JVP 8-9 . Carotids 2+ bilat; no bruits. No lymphadenopathy or thryomegaly appreciated.; Cor: Mechanical heart sounds with LVAD hum present, with soft heart sounds Lungs: clear, diminshed in the bases; + pocket drain Abdomen: soft, tender, not distended. No hepatosplenomegaly. No bruits or masses. + bowel sounds. Securement device intact and dressing C/D/I Extremities: no cyanosis, clubbing, rash, edema, R UE 2L PICC Neuro: alert & orientedx3, cranial nerves grossly intact. moves all 4 extremities w/o difficulty. Affect pleasant  Telemetry: ST  112  Labs: Basic Metabolic Panel:  Recent Labs Lab 06/05/12 0330 06/06/12 0425  06/07/12 0415 06/07/12 1544 06/08/12 0500 06/09/12 0617 06/10/12 0500  NA 137 137  < > 137 134* 136 133* 133*  K 3.8 3.6  < > 4.1 3.9 3.9 3.5 3.7  CL 104 101  < > 100 96 96 92* 95*  CO2 28 28  --  30  --  28 28 29   GLUCOSE 102* 87  < > 89 128* 94 114* 107*  BUN 15 15  < > 15 13 13 11 12   CREATININE 0.89 0.87  < > 0.98 0.90 0.98 0.89 0.97  CALCIUM 8.7 8.6  --  8.6  --  9.0 9.1 8.6  MG 2.3 2.0  --  2.0  --  2.0  --   --   PHOS 4.2 3.7  --  3.4  --  3.6  --   --   < > = values in this interval not displayed.  Liver Function Tests:  Recent Labs Lab 06/05/12 0330 06/09/12 0617 06/10/12 0500  AST 37 21 30  ALT 13 16 25   ALKPHOS 48  --  77  BILITOT 1.2 1.2 1.0  PROT 5.5*  --  5.8*  ALBUMIN 2.7*  --  2.7*    CBC:  Recent Labs Lab 06/05/12 0330 06/06/12 0425   06/07/12 0415 06/07/12 1544 06/08/12 0500 06/09/12 0617 06/10/12 0500 06/11/12 0500  WBC 14.1* 10.4  --  9.4  --  9.9 15.2* 15.3* 16.1*  NEUTROABS 11.9* 8.4*  --   --   --   --   --   --   --   HGB 8.6* 8.9*  < > 8.9* 9.9* 10.0* 9.9* 9.0* 9.7*  HCT 24.9* 26.5*  < > 25.9* 29.0* 29.1* 29.1* 26.2* 28.4*  MCV 86.5 86.6  --  83.8  --  84.6 84.6 83.7 82.8  PLT 100* 114*  --  135*  --  173 240 220 297  < > = values in this interval not displayed.  INR:  Recent Labs Lab 06/07/12 0415 06/08/12 0500 06/09/12 0617 06/10/12 0550 06/11/12 0500  INR 2.38* 2.67* 2.29* 2.23* 1.87*    Other results:  EKG: sinus tach  Imaging: Ct Abdomen Pelvis Wo Contrast  06/09/2012  *RADIOLOGY REPORT*  Clinical Data:  Right-sided flank pain.  Abdominal pain.  Recent left ventricular assist device placement.  Congestive heart failure.  Hypertension.  CT CHEST, ABDOMEN AND PELVIS WITHOUT  CONTRAST  Technique:  Contiguous axial images of the chest abdomen and pelvis were obtained without  IV contrast administration.  Comparison: Plain film chest 06/09/2012.  Prior CTs of 05/31/2012.  CT CHEST  Findings: Lung windows demonstrate degradation involving the lower chest and upper abdomen secondary beam hardening artifact from the assist device.  Patchy dependent right base atelectasis. No pneumothorax.  Dependent atelectasis within the left upper lobe and left lower lobe.  Soft tissue windows demonstrate pacer / AICD device with leads right atrium right ventricle.  Moderate global cardiomegaly with small pericardial effusion.   A small left-sided pleural effusion with new mild pleural calcification, including on image 41/series 2.  There is minimal fluid tracking in the left major fissure on image 21/series 2.  Ventricular assist device is identified at the left ventricular apex and entering the ascending aorta anteriorly.  Limited evaluation for mediastinal and hilar adenopathy, secondary artifact and lack of IV contrast.   Minimal air identified adjacent the tract of the assist device. Example image 40/series 2, image 43/series 2.  There is ill-defined fluid within these areas and along the course of the assist device. There may be minimal anterior left sided air versus aerated lung on image 46/series 2.  Bilateral gynecomastia.  IMPRESSION:  1.  Interval placement of left ventricular assist device.  Ill- defined fluid and minimal air seen along the tract of the  device. This is indeterminate, given seven days postop.  If there is a suspicion of infection, follow-up with contrast enhanced CT may be informative. 2.  Small left pleural effusion with pleural calcifications, possibly related to interval hemothorax.  3.  Multifactorial degradation.  CT ABDOMEN AND PELVIS  Findings:  Degradation continuing in the upper abdomen, moderate in severity.  Grossly normal liver and spleen.  Underdistended stomach.  Poor evaluation of the pancreas and gallbladder.  Normal adrenal glands and kidneys.  No retroperitoneal adenopathy.  Normal colon and terminal ileum.  Normal small bowel caliber.  No pelvic adenopathy.    Normal urinary bladder and prostate. Trace pelvic fluid. No acute osseous abnormality.  Advanced L4-L5 degenerative disc disease.  IMPRESSION:  1.  Degradation, moderate severity continuing into the upper abdomen. 2.  Trace cul-de-sac fluid, nonspecific. 3.  Given above limitation, no specific explanation for abdominal pain.   Original Report Authenticated By: Jeronimo Greaves, M.D.    Ct Chest Wo Contrast  06/09/2012  *RADIOLOGY REPORT*  Clinical Data:  Right-sided flank pain.  Abdominal pain.  Recent left ventricular assist device placement.  Congestive heart failure.  Hypertension.  CT CHEST, ABDOMEN AND PELVIS WITHOUT  CONTRAST  Technique:  Contiguous axial images of the chest abdomen and pelvis were obtained without  IV contrast administration.  Comparison: Plain film chest 06/09/2012.  Prior CTs of 05/31/2012.  CT CHEST  Findings:  Lung windows demonstrate degradation involving the lower chest and upper abdomen secondary beam hardening artifact from the assist device.  Patchy dependent right base atelectasis. No pneumothorax.  Dependent atelectasis within the left upper lobe and left lower lobe.  Soft tissue windows demonstrate pacer / AICD device with leads right atrium right ventricle.  Moderate global cardiomegaly with small pericardial effusion.   A small left-sided pleural effusion with new mild pleural calcification, including on image 41/series 2.  There is minimal fluid tracking in the left major fissure on image 21/series 2.  Ventricular assist device is identified at the left ventricular apex and entering the ascending aorta anteriorly.  Limited evaluation for mediastinal and hilar adenopathy, secondary artifact and lack of IV contrast.  Minimal air identified adjacent the tract of the assist device. Example image 40/series 2, image 43/series 2.  There is ill-defined fluid within these areas and along the course of the assist device. There may be minimal anterior left sided air versus aerated lung on image 46/series 2.  Bilateral gynecomastia.  IMPRESSION:  1.  Interval placement of left ventricular assist device.  Ill- defined fluid and minimal air seen along the tract of the device. This is indeterminate, given seven days postop.  If there is a suspicion of infection, follow-up with contrast enhanced CT may be informative. 2.  Small left pleural effusion with pleural calcifications, possibly related to interval hemothorax.  3.  Multifactorial degradation.  CT ABDOMEN AND PELVIS  Findings:  Degradation continuing in the upper abdomen, moderate in severity.  Grossly normal liver and spleen.  Underdistended stomach.  Poor evaluation of the pancreas and gallbladder.  Normal adrenal glands and kidneys.  No retroperitoneal adenopathy.  Normal colon and terminal ileum.  Normal small bowel caliber.  No pelvic adenopathy.    Normal urinary  bladder and prostate. Trace pelvic fluid. No acute osseous abnormality.  Advanced L4-L5 degenerative disc disease.  IMPRESSION:  1.  Degradation, moderate severity continuing into the upper abdomen. 2.  Trace cul-de-sac fluid, nonspecific. 3.  Given above limitation, no specific explanation for abdominal pain.  Original Report Authenticated By: Jeronimo Greaves, M.D.      Medications:     Scheduled Medications: . allopurinol  300 mg Oral Daily  . aspirin EC  325 mg Oral Daily   Or  . aspirin  324 mg Per Tube Daily   Or  . aspirin  300 mg Rectal Daily  . docusate sodium  200 mg Oral Daily  . ferrous fumarate-b12-vitamic C-folic acid  1 capsule Oral TID PC  . lisinopril  10 mg Oral Daily  . metoprolol succinate  50 mg Oral BID  . pantoprazole  40 mg Oral Daily  . patient's guide to using coumadin book   Does not apply Once  . potassium chloride  40 mEq Oral Daily  . sodium chloride  3 mL Intravenous Q12H  . torsemide  20 mg Oral BID  . Warfarin - Pharmacist Dosing Inpatient   Does not apply q1800    Infusions:    PRN Medications: sodium chloride, bisacodyl, fentaNYL, HYDROcodone-acetaminophen, ondansetron (ZOFRAN) IV, oxyCODONE, sodium chloride, traMADol   Assessment:   1. 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 H/o VT  3. NICM  4. Pneumothorax s/p R chest tube 5. Subtherapeutic INR- resolved 6. Abdominal pain- resolved 7. Protein Calorie Malnutrition 8. Acute gout flareup R foot 9. Dark urine w/o change in LDH - LFT' normal Plan/Discussion:    POD # 9. Continues to do well and progressing well. Tremor from reglan- will DC. Elevated WBC w/o fever, will start Vanc pending final cultures. Drive line dressing dry.  Follow MAP off milrinone - currently running high  88-96 mm Hg Reduce diuretics Coumadin per pharmD Follow daily LDH, WBC  I reviewed the LVAD parameters from today, and compared the results to the patient's prior recorded data.  No programming changes were  made.  The LVAD is functioning within specified parameters.  The patient is performing the LVAD self-test daily.  LVAD interrogation was negative for any significant power changes, alarms or PI events/speed drops.  LVAD equipment check completed and is in good working order.  Back-up equipment present.   LVAD education done on emergency procedures and precautions and reviewed exit site care.  Length of Stay: 12  VAN TRIGT III,PETER 06/11/2012, 8:39 AM

## 2012-06-11 NOTE — Progress Notes (Signed)
Ambulated 800 ft using front wheel walker on LVAD batteries without difficulty. Returned to bedside chair. Patient requested to remain on batteries until 1800 LVAD check.  Call bell near. Mamie Levers

## 2012-06-11 NOTE — Progress Notes (Signed)
Dr. Gala Romney notified of current decrease in pump flow ( now 3.5) and Pulse Index ( 3.2); no new orders. Mamie Levers

## 2012-06-12 DIAGNOSIS — I5023 Acute on chronic systolic (congestive) heart failure: Secondary | ICD-10-CM

## 2012-06-12 DIAGNOSIS — I428 Other cardiomyopathies: Secondary | ICD-10-CM

## 2012-06-12 DIAGNOSIS — Z95818 Presence of other cardiac implants and grafts: Secondary | ICD-10-CM

## 2012-06-12 DIAGNOSIS — I472 Ventricular tachycardia, unspecified: Secondary | ICD-10-CM

## 2012-06-12 LAB — BASIC METABOLIC PANEL
BUN: 12 mg/dL (ref 6–23)
CO2: 30 mEq/L (ref 19–32)
Calcium: 8.4 mg/dL (ref 8.4–10.5)
Chloride: 94 mEq/L — ABNORMAL LOW (ref 96–112)
Creatinine, Ser: 1.03 mg/dL (ref 0.50–1.35)
GFR calc Af Amer: >90 mL/min (ref >90)
GFR calc non Af Amer: 79 mL/min — ABNORMAL LOW (ref >90)
Glucose, Bld: 128 mg/dL — ABNORMAL HIGH (ref 70–99)
Potassium: 3.3 mEq/L — ABNORMAL LOW (ref 3.5–5.1)
Sodium: 132 mEq/L — ABNORMAL LOW (ref 135–145)

## 2012-06-12 LAB — CARBOXYHEMOGLOBIN
Carboxyhemoglobin: 1.4 % (ref 0.5–1.5)
Methemoglobin: 0.9 % (ref 0.0–1.5)
O2 Saturation: 64.6 %
Total hemoglobin: 9.8 g/dL — ABNORMAL LOW (ref 13.5–18.0)

## 2012-06-12 LAB — CBC
HCT: 28 % — ABNORMAL LOW (ref 39.0–52.0)
Hemoglobin: 9.5 g/dL — ABNORMAL LOW (ref 13.0–17.0)
MCH: 28.4 pg (ref 26.0–34.0)
MCHC: 33.9 g/dL (ref 30.0–36.0)
MCV: 83.8 fL (ref 78.0–100.0)
Platelets: 315 10*3/uL (ref 150–400)
RBC: 3.34 MIL/uL — ABNORMAL LOW (ref 4.22–5.81)
RDW: 14.2 % (ref 11.5–15.5)
WBC: 14.5 10*3/uL — ABNORMAL HIGH (ref 4.0–10.5)

## 2012-06-12 LAB — TSH: TSH: 2.151 u[IU]/mL (ref 0.350–4.500)

## 2012-06-12 LAB — LACTATE DEHYDROGENASE: LDH: 347 U/L — ABNORMAL HIGH (ref 94–250)

## 2012-06-12 MED ORDER — TORSEMIDE 20 MG PO TABS
20.0000 mg | ORAL_TABLET | Freq: Every day | ORAL | Status: DC
  Administered 2012-06-12: 20 mg via ORAL
  Filled 2012-06-12: qty 1

## 2012-06-12 MED ORDER — SODIUM CHLORIDE 0.9 % IJ SOLN
10.0000 mL | INTRAMUSCULAR | Status: DC | PRN
  Administered 2012-06-12 – 2012-06-15 (×7): 10 mL

## 2012-06-12 MED ORDER — AMIODARONE HCL 200 MG PO TABS
400.0000 mg | ORAL_TABLET | Freq: Two times a day (BID) | ORAL | Status: DC
  Administered 2012-06-12 – 2012-06-16 (×9): 400 mg via ORAL
  Filled 2012-06-12 (×10): qty 2

## 2012-06-12 MED ORDER — METOPROLOL SUCCINATE ER 25 MG PO TB24
25.0000 mg | ORAL_TABLET | Freq: Two times a day (BID) | ORAL | Status: DC
  Administered 2012-06-12: 25 mg via ORAL
  Filled 2012-06-12 (×3): qty 1

## 2012-06-12 MED ORDER — WARFARIN SODIUM 5 MG PO TABS
5.0000 mg | ORAL_TABLET | Freq: Once | ORAL | Status: AC
  Administered 2012-06-12: 5 mg via ORAL
  Filled 2012-06-12: qty 1

## 2012-06-12 MED ORDER — SODIUM CHLORIDE 0.9 % IV BOLUS (SEPSIS)
750.0000 mL | Freq: Once | INTRAVENOUS | Status: AC
  Administered 2012-06-12: 750 mL via INTRAVENOUS

## 2012-06-12 MED ORDER — POTASSIUM CHLORIDE CRYS ER 20 MEQ PO TBCR
40.0000 meq | EXTENDED_RELEASE_TABLET | Freq: Three times a day (TID) | ORAL | Status: DC
  Administered 2012-06-12 – 2012-06-13 (×6): 40 meq via ORAL
  Filled 2012-06-12 (×8): qty 2

## 2012-06-12 MED ORDER — SODIUM CHLORIDE 0.9 % IV BOLUS (SEPSIS)
250.0000 mL | Freq: Once | INTRAVENOUS | Status: AC
  Administered 2012-06-12: 250 mL via INTRAVENOUS

## 2012-06-12 NOTE — Progress Notes (Signed)
1355 Cardiac Rehab Noted event and VT, will follow pt tomorrow. Melina Copa RN

## 2012-06-12 NOTE — Progress Notes (Signed)
Pt with NP Karie Mainland on his was to bathroom and felt dizzy. Pt lowered himself to the ground and went into [place] and passed out. MD made aware and rapid response. Pt was helped up to chair with NP and several nurses. MD Bensimhon at bedside ordered bolus and for no PT/cardiac rehab work today. Pt is to rest until PI stays above 3.5. Pt is now safely resting in bed with feet elevated. MAP was 90. Pt no longer feels dizzy and HR in the 90's. Bolus infusing. Will continue to monitor. Pt instructed not to get up on his own.

## 2012-06-12 NOTE — Progress Notes (Signed)
Removed pt's EPW and chest tube sutures per MD order. No arrhythmias overnight. Tips were intact when removed EPW. Pt tolerated well and on bed rest for one hour.

## 2012-06-12 NOTE — Progress Notes (Signed)
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 Reports tremor much better with discontinuation of reglan yesterday. Walking in the halls with minimal SOB. Gout much improved. WBC trending down with vanc. EPWs still in place.  MAPs 80-90's and HR more down with increase in toprol (100-110s).  Diuresing briskly with oral torsemide 20 bid  INR: 1.89  LVAD INTERROGATION:  HeartMate II LVAD: Flow --- liters/min, speed 10K, power 4.3 , PI 5.2    Objective:    Vital Signs:   Temp:  [98.3 F (36.8 C)-98.7 F (37.1 C)] 98.7 F (37.1 C) (03/10 0500) Pulse Rate:  [89-106] 106 (03/10 0500) Resp:  [16] 16 (03/10 0500) BP: (96)/(1) 96/1 mmHg (03/09 1024) SpO2:  [95 %-97 %] 95 % (03/10 0500) Last BM Date: 06/11/12 Mean arterial Pressure 80's  Intake/Output:   Intake/Output Summary (Last 24 hours) at 06/12/12 0717 Last data filed at 06/12/12 0653  Gross per 24 hour  Intake 1479.05 ml  Output   1450 ml  Net  29.05 ml     Physical Exam: General:  Sitting on side of bed;  No resp difficulty HEENT: normal Neck: supple. JVP flat; Carotids 2+ bilat; no bruits. No lymphadenopathy or thryomegaly appreciated.   Cor:  LVAD hum present  Lungs: clear,  Abdomen: soft, nontender,. + bowel sounds.Securement device intact and dressing C/D/I Extremities: no cyanosis, clubbing, rash, edema Neuro: alert & orientedx3, cranial nerves grossly intact. moves all 4 extremities w/o difficulty. No tremor Telemetry: ST 110-115  Labs: Basic Metabolic Panel:  Recent Labs Lab 06/06/12 0425  06/07/12 0415  06/08/12 0500 06/09/12 0617 06/10/12 0500 06/11/12 0900 06/12/12 0525  NA 137  < > 137  < > 136 133* 133* 136 132*  K 3.6  < > 4.1  < > 3.9 3.5 3.7 3.6 3.3*  CL 101  < > 100  < > 96 92* 95* 95* 94*  CO2 28  --  30  --  28 28  29 30 30   GLUCOSE 87  < > 89  < > 94 114* 107* 127* 128*  BUN 15  < > 15  < > 13 11 12 13 12   CREATININE 0.87  < > 0.98  < > 0.98 0.89 0.97 1.06 1.03  CALCIUM 8.6  --  8.6  --  9.0 9.1 8.6 8.6 8.4  MG 2.0  --  2.0  --  2.0  --   --   --   --   PHOS 3.7  --  3.4  --  3.6  --   --   --   --   < > = values in this interval not displayed.  Liver Function Tests:  Recent Labs Lab 06/09/12 0617 06/10/12 0500  AST 21 30  ALT 16 25  ALKPHOS  --  77  BILITOT 1.2 1.0  PROT  --  5.8*  ALBUMIN  --  2.7*   No results found for this basename: LIPASE, AMYLASE,  in the last 168 hours No results found for this basename: AMMONIA,  in the last 168 hours  CBC:  Recent Labs Lab 06/06/12 0425  06/08/12 0500 06/09/12 0617 06/10/12 0500 06/11/12 0500 06/12/12 0525  WBC 10.4  < > 9.9 15.2* 15.3* 16.1* 14.5*  NEUTROABS 8.4*  --   --   --   --   --   --  HGB 8.9*  < > 10.0* 9.9* 9.0* 9.7* 9.5*  HCT 26.5*  < > 29.1* 29.1* 26.2* 28.4* 28.0*  MCV 86.6  < > 84.6 84.6 83.7 82.8 83.8  PLT 114*  < > 173 240 220 297 315  < > = values in this interval not displayed.  INR:  Recent Labs Lab 06/08/12 0500 06/09/12 0617 06/10/12 0550 06/11/12 0500 06/12/12 0525  INR 2.67* 2.29* 2.23* 1.87* 1.89*    Other results:   Imaging: Dg Chest 2 View  06/11/2012  *RADIOLOGY REPORT*  Clinical Data: Left ventricular assist device 06/02/2012  CHEST - 2 VIEW  Comparison: 06/09/2012  Findings: Left ventricular assist device is unchanged.  AICD unchanged.  Cardiac enlargement.  Negative for heart failure. Small left effusion and left lower lobe atelectasis, unchanged. Right arm PICC tip in the SVC appear  IMPRESSION: No significant change.  Negative for heart failure.   Original Report Authenticated By: Janeece Riggers, M.D.      Medications:     Scheduled Medications: . allopurinol  300 mg Oral Daily  . aspirin EC  325 mg Oral Daily   Or  . aspirin  324 mg Per Tube Daily   Or  . aspirin  300 mg Rectal  Daily  . docusate sodium  200 mg Oral Daily  . ferrous fumarate-b12-vitamic C-folic acid  1 capsule Oral TID PC  . lisinopril  10 mg Oral Daily  . metoprolol succinate  50 mg Oral BID  . pantoprazole  40 mg Oral Daily  . patient's guide to using coumadin book   Does not apply Once  . potassium chloride  40 mEq Oral Daily  . sodium chloride  3 mL Intravenous Q12H  . torsemide  20 mg Oral BID  . vancomycin  1,000 mg Intravenous Q8H  . Warfarin - Pharmacist Dosing Inpatient   Does not apply q1800    Infusions:    PRN Medications: sodium chloride, bisacodyl, fentaNYL, HYDROcodone-acetaminophen, ondansetron (ZOFRAN) IV, oxyCODONE, sodium chloride, sodium chloride, traMADol, zolpidem   Assessment:   1. 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 H/o VT  3. NICM  4. Pneumothorax s/p L chest tube 5. Hyponatremia/hypokalemia 6. Tremor  Plan/Discussion:    POD # 10  Doing great. K+ low will supplement. Appears to be euvolemic will cut demadex to 20 daily. Will talk to Dr. Maren Beach about dc EPW wires and sutures. Tremor improved with dc of reglan. WBC trending down and cultures negative at this time, will continue Vanc.  Family coming up today for education around noon. Goal for patient to go home end of the week.   INR 1.89, pharmacy to dose.   I reviewed the LVAD parameters from today, and compared the results to the patient's prior recorded data.  No programming changes were made.  The LVAD is functioning within specified parameters.  The patient/staff is performing the LVAD self-test daily.  LVAD interrogation was negative for any significant power changes, alarms or PI events/speed drops. A Few PI events, however non-sustained. LVAD equipment check completed and is in good working order.  Back-up equipment present.   LVAD education done on emergency procedures and precautions and reviewed exit site care.  Length of Stay: [address]  [name] 06/12/2012 7:17 AM  Patient seen and  examined with Ulla Potash, NP. We discussed all aspects of the encounter. I agree with the assessment and plan as stated above.   He is doing very well. Volume status looks  good. Can cut torsemide to 20 daily. WBC improving with vanc. Cx negative. MAPs ok. Extrapyramidal symptoms resolving. Speed of 10K seems much better for him. Education today.  Robert Bensimhon,MD 8:02 AM

## 2012-06-12 NOTE — Progress Notes (Addendum)
At bedside providing patient and family with education when patient had to get up to the bathroom. He stood up and immediately felt dizzy and sat down in the chair. I looked at the monitor and patient was in [place]. I started to walk over to the patient and before I could get to him he slouched/fell out of the chair to the ground. He did not hit his head and I walked over to him and helped him with the nurses to get him of the floor. Dr. Gala Romney was called and came to the bedside and we gave 1L NS bolus, decreased his toprol, and started him on 400 mg Amio BID. Patient helped to chair. Appears that patient had suction event and PI extremely low. Have diuresed patient over 10 lbs the last few days and is way below baseline. Will hold diuretics for now.   Patient seen and examined with Ulla Potash, NP. We discussed all aspects of the encounter. I agree with the assessment and plan as stated above.   Pt had syncopal episode in setting of standing with VT on monitor. On my arrival he was sitting in chair appearing very weak and diaphoretic. MAPs OK. Review of monitorshows VT.   Suspect he is volume depleted with suction event triggering VT. We hydrated him and started amiodarone. Discussed events with family.   Daniel Bensimhon,MD 6:20 PM

## 2012-06-12 NOTE — Progress Notes (Signed)
PT Cancellation Note  Patient Details Name: Robert [name] MRN: [id number] DOB: May 17, 1955   Cancelled Treatment:    Reason Eval/Treat Not Completed: Medical issues which prohibited therapy (Am - pt had pacing wires pulled; Pm - pt refused )   INGOLD,Makar Slatter 06/12/2012, 3:26 PM Quanika Solem [facility] [phone] (pager)

## 2012-06-12 NOTE — Progress Notes (Signed)
Changed pt's driveline dressing and foley anchor. Pt tolerated well. Site looks clean dry and intact. No signs of infection.

## 2012-06-12 NOTE — Progress Notes (Addendum)
ANTICOAGULATION CONSULT NOTE - Follow Up Consult  Pharmacy Consult for Coumadin Indication: s/p LVAD  Pharmacy Consult for vancomycin  Indication: leukocytosis, chills  Patient Weight: 92.1 kg  Labs:  Recent Labs  06/10/12 0500 06/10/12 0550 06/11/12 0500 06/11/12 0900 06/12/12 0525  HGB 9.0*  --  9.7*  --  9.5*  HCT 26.2*  --  28.4*  --  28.0*  PLT 220  --  297  --  315  LABPROT  --  23.7* 20.8*  --  21.0*  INR  --  2.23* 1.87*  --  1.89*  CREATININE 0.97  --   --  1.06 1.03   Lab Results  Component Value Date   INR 1.89* 06/12/2012   INR 1.87* 06/11/2012   INR 2.23* 06/10/2012    Assessment:  57 y/o male POD # 10 s/p LVAD on Coumadin. Baseline coagulopathy resolving.  INR still just below therapeutic goal, possibly due to conservative doses after sudden rise in INR.  Patient very sensitive to small dose changes of Coumadin, therefore will increase Coumadin conservatively.  No bleeding complications noted   No fevers noted, wbc down to 14.5  Goal of Therapy:  INR 2-3    Plan:  Coumadin 5 mg today  Daily INR's, CBC Check vancomycin trough this morning  Sheppard Coil PharmD., BCPS Clinical Pharmacist Pager [phone] 06/12/2012 7:33 AM    Addendum:  I spent a good deal of time ~30 minutes reviewing the coumadin education with Robert [name]. I believe he has good understanding of the medication and was able to teach back the key points.   We discussed signs and symptoms of bleeding as well as signs and symptoms of stroke. We spent a good amount of time discussing monitoring, the meaning of various INR levels and how the dosing of coumadin is individualized. Robert [name] states that he does not drink alcohol so that should not be a problem.   Our major issue of concern was his intake of green leafy vegetables. He was educated on the vitamin k rich foods and understood. We discussed keeping a diet consistent in vit k foods is the best way to avoid any issues with his  warfarin therapy and a food journal may be needed if things become difficult to manage.  He had no further questions and the end of our time, I let him know that if his family or himself has questions that I would be able to answer them any time this week.  Sheppard Coil PharmD., BCPS Clinical Pharmacist Pager [phone] 06/12/2012 11:22 AM

## 2012-06-12 NOTE — Progress Notes (Signed)
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 Cath 2011 showed normal coronaries. He also has a history of VTach.   He is followed very closely in Heart Failure Clinic and In December was referred to the Transplant Clinic at [facility] as his symptoms began to worsen, we were unable to titrate meds due to hypotension and frequent NSVT. He underwent transplant evaluation, RHC showed low output with high filling pressures. He was diuresed and placed on home inotropes. He is on the transplant list and was having worsening HF symptoms when he was admitted to the hospital for bridge VAD on 05/30/12/  LVAD POD #10 Patient stable overnight. Ambulating well in the hall-800 feet. Patient had BM yesterday and his abdominal pain is much better. Gout pain imroved significantly in great toe. Denies SOB/DOE or CP.. . CXR 3/9 with clear lungs and no pleural effusions.Weight  almost at  preop. Co-ox  Improved at 64%, speed increased to 10,000 RPM with min opening of Ao valve and good RV function. Milrinone stopped yesterday. .  PI event this am due to dehydration- diuresis  WBC remains elevated  Improved to 14k. Afebrile and cultures negative.- . Surgical incision and PICC all clean and dry. Vanco started until cultures return final report   EPW's removed-  as well as chest tube sutures.   INR 1.8     coumadin per PharmD  LVAD INTERROGATION:  HeartMate II LVAD:  Flow4.0liters/min, speed 10,000 power 5.3, PI 4.3  Objective:    Vital Signs:   Temp:  [98.4 F (36.9 C)-98.7 F (37.1 C)] 98.7 F (37.1 C) (03/10 0500) Pulse Rate:  [98-106] 106 (03/10 0500) Resp:  [16] 16 (03/10 0500) SpO2:  [95 %-97 %] 95 % (03/10 0500) Last BM Date: 06/11/12 Mean arterial Pressure 72-86  Intake/Output:   Intake/Output Summary (Last 24  hours) at 06/12/12 1615 Last data filed at 06/12/12 1204  Gross per 24 hour  Intake 1670.84 ml  Output    800 ml  Net 870.84 ml     Physical Exam:  Feels well except tremor - prob side effect of reglan Surgical incisions dry and clean, lungs clear PICC line clean and dry  General: Sitting on side of bed; No resp difficulty HEENT: normal Neck: supple. JVP 8-9 . Carotids 2+ bilat; no bruits. No lymphadenopathy or thryomegaly appreciated.; Cor: Mechanical heart sounds with LVAD hum present, with soft heart sounds Lungs: clear, diminshed in the bases; + pocket drain Abdomen: soft, tender, not distended. No hepatosplenomegaly. No bruits or masses. + bowel sounds. Securement device intact and dressing C/D/I Extremities: no cyanosis, clubbing, rash, edema, R UE 2L PICC Neuro: alert & orientedx3, cranial nerves grossly intact. moves all 4 extremities w/o difficulty. Affect pleasant  Telemetry: ST  104  Labs: Basic Metabolic Panel:  Recent Labs Lab 06/06/12 0425  06/07/12 0415  06/08/12 0500 06/09/12 0617 06/10/12 0500 06/11/12 0900 06/12/12 0525  NA 137  < > 137  < > 136 133* 133* 136 132*  K 3.6  < > 4.1  < > 3.9 3.5 3.7 3.6 3.3*  CL 101  < > 100  < > 96 92* 95* 95* 94*  CO2 28  --  30  --  28 28 29 30 30   GLUCOSE 87  < >  89  < > 94 114* 107* 127* 128*  BUN 15  < > 15  < > 13 11 12 13 12   CREATININE 0.87  < > 0.98  < > 0.98 0.89 0.97 1.06 1.03  CALCIUM 8.6  --  8.6  --  9.0 9.1 8.6 8.6 8.4  MG 2.0  --  2.0  --  2.0  --   --   --   --   PHOS 3.7  --  3.4  --  3.6  --   --   --   --   < > = values in this interval not displayed.  Liver Function Tests:  Recent Labs Lab 06/09/12 0617 06/10/12 0500  AST 21 30  ALT 16 25  ALKPHOS  --  77  BILITOT 1.2 1.0  PROT  --  5.8*  ALBUMIN  --  2.7*    CBC:  Recent Labs Lab 06/06/12 0425  06/08/12 0500 06/09/12 0617 06/10/12 0500 06/11/12 0500 06/12/12 0525  WBC 10.4  < > 9.9 15.2* 15.3* 16.1* 14.5*  NEUTROABS 8.4*   --   --   --   --   --   --   HGB 8.9*  < > 10.0* 9.9* 9.0* 9.7* 9.5*  HCT 26.5*  < > 29.1* 29.1* 26.2* 28.4* 28.0*  MCV 86.6  < > 84.6 84.6 83.7 82.8 83.8  PLT 114*  < > 173 240 220 297 315  < > = values in this interval not displayed.  INR:  Recent Labs Lab 06/08/12 0500 06/09/12 0617 06/10/12 0550 06/11/12 0500 06/12/12 0525  INR 2.67* 2.29* 2.23* 1.87* 1.89*    Other results:  EKG: sinus tach  Imaging: Dg Chest 2 View  06/11/2012  *RADIOLOGY REPORT*  Clinical Data: Left ventricular assist device 06/02/2012  CHEST - 2 VIEW  Comparison: 06/09/2012  Findings: Left ventricular assist device is unchanged.  AICD unchanged.  Cardiac enlargement.  Negative for heart failure. Small left effusion and left lower lobe atelectasis, unchanged. Right arm PICC tip in the SVC appear  IMPRESSION: No significant change.  Negative for heart failure.   Original Report Authenticated By: Janeece Riggers, M.D.      Medications:     Scheduled Medications: . allopurinol  300 mg Oral Daily  . amiodarone  400 mg Oral BID  . aspirin EC  325 mg Oral Daily   Or  . aspirin  324 mg Per Tube Daily   Or  . aspirin  300 mg Rectal Daily  . docusate sodium  200 mg Oral Daily  . ferrous fumarate-b12-vitamic C-folic acid  1 capsule Oral TID PC  . lisinopril  10 mg Oral Daily  . metoprolol succinate  25 mg Oral BID  . pantoprazole  40 mg Oral Daily  . patient's guide to using coumadin book   Does not apply Once  . potassium chloride  40 mEq Oral TID  . sodium chloride  3 mL Intravenous Q12H  . vancomycin  1,000 mg Intravenous Q8H  . warfarin  5 mg Oral ONCE-1800  . Warfarin - Pharmacist Dosing Inpatient   Does not apply q1800    Infusions:  none- received saline bolus for PI event  PRN Medications: sodium chloride, bisacodyl, fentaNYL, HYDROcodone-acetaminophen, ondansetron (ZOFRAN) IV, oxyCODONE, sodium chloride, sodium chloride, traMADol, zolpidem   Assessment:   1. A/C systolic HF s/p HMII  LVAD on 0/86  2. H/o VT  3. NICM  4. Pneumothorax s/p R chest tube 5.  Subtherapeutic INR- resolved 6. Abdominal pain- resolved 7. Protein Calorie Malnutrition 8. Acute gout flareup R foot 9. Dark urine w/o change in LDH - LFT' normal Plan/Discussion:    POD # 10. Continues to do well and progressing well. Tremor from reglan better. Elevated WBC w/o fever better, Vanc pending final cultures. Drive line dressing dry.  Follow MAP off milrinone - currently running high   80-90 mm Hg Reduce diuretics Coumadin per pharmD Follow daily LDH, WBC  I reviewed the LVAD parameters from today, and compared the results to the patient's prior recorded data.  No programming changes were made.  The LVAD is functioning within specified parameters.  The patient is performing the LVAD self-test daily.  LVAD interrogation was negative for any significant power changes, alarms or PI events/speed drops.  LVAD equipment check completed and is in good working order.  Back-up equipment present.   LVAD education done on emergency procedures and precautions and reviewed exit site care.  Length of Stay: 13  VAN TRIGT III,Mirabella Hilario 06/12/2012, 4:15 PM

## 2012-06-12 NOTE — Progress Notes (Signed)
OT Cancellation Note  Patient Details Name: Robert [name] MRN: [id number] DOB: 02-02-1956   Cancelled Treatment:    Reason Eval/Treat Not Completed: Medical issues which prohibited therapy. Medical issues this pm. See NP note.  [facility] Ward, OTR/L  [phone] 06/12/2012 06/12/2012, 3:42 PM

## 2012-06-13 DIAGNOSIS — Z95818 Presence of other cardiac implants and grafts: Secondary | ICD-10-CM

## 2012-06-13 DIAGNOSIS — I319 Disease of pericardium, unspecified: Secondary | ICD-10-CM

## 2012-06-13 DIAGNOSIS — I428 Other cardiomyopathies: Secondary | ICD-10-CM

## 2012-06-13 DIAGNOSIS — I5023 Acute on chronic systolic (congestive) heart failure: Secondary | ICD-10-CM

## 2012-06-13 LAB — CBC
Platelets: 328 10*3/uL (ref 150–400)
RDW: 14.2 % (ref 11.5–15.5)
WBC: 14.3 10*3/uL — ABNORMAL HIGH (ref 4.0–10.5)

## 2012-06-13 LAB — URINE MICROSCOPIC-ADD ON

## 2012-06-13 LAB — PROTIME-INR: INR: 1.84 — ABNORMAL HIGH (ref 0.00–1.49)

## 2012-06-13 LAB — BASIC METABOLIC PANEL
BUN: 13 mg/dL (ref 6–23)
CO2: 27 mEq/L (ref 19–32)
Calcium: 8.4 mg/dL (ref 8.4–10.5)
Chloride: 98 mEq/L (ref 96–112)
Creatinine, Ser: 1.02 mg/dL (ref 0.50–1.35)
GFR calc Af Amer: >90 mL/min (ref >90)
GFR calc non Af Amer: 80 mL/min — ABNORMAL LOW (ref >90)
Glucose, Bld: 102 mg/dL — ABNORMAL HIGH (ref 70–99)
Potassium: 4.1 mEq/L (ref 3.5–5.1)
Sodium: 135 mEq/L (ref 135–145)

## 2012-06-13 LAB — URINALYSIS, ROUTINE W REFLEX MICROSCOPIC
Bilirubin Urine: NEGATIVE
Glucose, UA: NEGATIVE mg/dL
Hgb urine dipstick: NEGATIVE
Ketones, ur: NEGATIVE mg/dL
Nitrite: NEGATIVE
Protein, ur: NEGATIVE mg/dL
Specific Gravity, Urine: 1.025 (ref 1.005–1.030)
Urobilinogen, UA: 2 mg/dL — ABNORMAL HIGH (ref 0.0–1.0)
pH: 7 (ref 5.0–8.0)

## 2012-06-13 LAB — LACTATE DEHYDROGENASE: LDH: 354 U/L — ABNORMAL HIGH (ref 94–250)

## 2012-06-13 LAB — VANCOMYCIN, TROUGH: Vancomycin Tr: 11.1 ug/mL (ref 10.0–20.0)

## 2012-06-13 MED ORDER — WARFARIN SODIUM 5 MG PO TABS
5.0000 mg | ORAL_TABLET | Freq: Once | ORAL | Status: AC
  Administered 2012-06-13: 5 mg via ORAL
  Filled 2012-06-13: qty 1

## 2012-06-13 MED ORDER — SODIUM CHLORIDE 0.9 % IV SOLN
1500.0000 mg | Freq: Three times a day (TID) | INTRAVENOUS | Status: DC
  Administered 2012-06-13 – 2012-06-14 (×5): 1500 mg via INTRAVENOUS
  Filled 2012-06-13 (×9): qty 1500

## 2012-06-13 NOTE — Progress Notes (Signed)
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 Cath 2011 showed normal coronaries. He also has a history of VTach.   He is followed very closely in Heart Failure Clinic and In December was referred to the Transplant Clinic at [facility] as his symptoms began to worsen, we were unable to titrate meds due to hypotension and frequent NSVT. He underwent transplant evaluation, RHC showed low output with high filling pressures. He was diuresed and placed on home inotropes. He is on the transplant list and was having worsening HF symptoms when he was admitted to the hospital for bridge VAD on 05/30/12/  LVAD POD #11  Patient has had PI events with nonsustained VT after having milrinone weaned off, LVAD speed turned up to 10,000 RPMs and significant diuresis. Diuretics have been held, beta blocker has been held. H[s mean arterial blood pressure has been 80-90 today. His heart rate is lower now that he is on amiodarone. He still feels somewhat weak although he was able to walk today.  2-D echocardiogram was performed today to evaluate for pericardial effusion as pacing wires were removed yesterday.   Total picture consistent with low LV filling probably multifactorial from stopping milrinone, aggressive diuresis, and increasing beta blocker.   WBC remains elevated  Improved to 14k. Afebrile and cultures negative.- . Surgical incision and PICC all clean and dry. Vanco started until cultures return final report.  Lungs are clear to auscultation Surgical incisions are clean and dry No evidence of peripheral edema Urine is clear, cultures remained negative. Day 3 vancomycin .   INR 1.89    coumadin per PharmD  LVAD INTERROGATION:  HeartMate II LVAD:  Flow4.0liters/min, speed 10,000 power 5.3, PI 4.3  Objective:     Vital Signs:   Temp:  [98.1 F (36.7 C)-98.6 F (37 C)] 98.4 F (36.9 C) (03/11 1300) Pulse Rate:  [76-81] 76 (03/11 0515) Resp:  [16-20] 20 (03/11 0515) SpO2:  [95 %-100 %] 98 % (03/11 1300) Weight:  [196 lb 11.2 oz (89.223 kg)] 196 lb 11.2 oz (89.223 kg) (03/11 0515) Last BM Date: 06/11/12 Mean arterial Pressure 72-86  Intake/Output:   Intake/Output Summary (Last 24 hours) at 06/13/12 1606 Last data filed at 06/13/12 1300  Gross per 24 hour  Intake    720 ml  Output    900 ml  Net   -180 ml     Physical Exam:  Normal sinus rhythm, weight 6 pounds below baseline Surgical incisions dry and clean, lungs clear PICC line clean and dry  General: Sitting on side of bed; No resp difficulty HEENT: normal Neck: supple. JVP 8-9 . Carotids 2+ bilat; no bruits. No lymphadenopathy or thryomegaly appreciated.; Cor: Mechanical heart sounds with LVAD hum present, with soft heart sounds Lungs: clear, diminshed in the bases; + pocket drain Abdomen: soft, tender, not distended. No hepatosplenomegaly. No bruits or masses. + bowel sounds. Securement device intact and dressing C/D/I Extremities: no cyanosis, clubbing, rash, edema, R UE 2L PICC Neuro: alert & orientedx3, cranial nerves grossly intact. moves all 4 extremities w/o difficulty. Affect pleasant  Telemetry: ST  84  Labs: Basic Metabolic Panel:  Recent Labs Lab 06/07/12 0415  06/08/12 0500 06/09/12 0617 06/10/12 0500 06/11/12 0900 06/12/12 0525 06/13/12 0500  NA 137  < > 136 133* 133*  136 132* 135  K 4.1  < > 3.9 3.5 3.7 3.6 3.3* 4.1  CL 100  < > 96 92* 95* 95* 94* 98  CO2 30  --  28 28 29 30 30 27   GLUCOSE 89  < > 94 114* 107* 127* 128* 102*  BUN 15  < > 13 11 12 13 12 13   CREATININE 0.98  < > 0.98 0.89 0.97 1.06 1.03 1.02  CALCIUM 8.6  --  9.0 9.1 8.6 8.6 8.4 8.4  MG 2.0  --  2.0  --   --   --   --   --   PHOS 3.4  --  3.6  --   --   --   --   --   < > = values in this interval not displayed.  Liver Function  Tests:  Recent Labs Lab 06/09/12 0617 06/10/12 0500  AST 21 30  ALT 16 25  ALKPHOS  --  77  BILITOT 1.2 1.0  PROT  --  5.8*  ALBUMIN  --  2.7*    CBC:  Recent Labs Lab 06/09/12 0617 06/10/12 0500 06/11/12 0500 06/12/12 0525 06/13/12 0500  WBC 15.2* 15.3* 16.1* 14.5* 14.3*  HGB 9.9* 9.0* 9.7* 9.5* 8.9*  HCT 29.1* 26.2* 28.4* 28.0* 26.3*  MCV 84.6 83.7 82.8 83.8 85.1  PLT 240 220 297 315 328    INR:  Recent Labs Lab 06/09/12 0617 06/10/12 0550 06/11/12 0500 06/12/12 0525 06/13/12 0500  INR 2.29* 2.23* 1.87* 1.89* 1.84*    Other results:  EKG: sinus tach  Imaging: No results found.   Medications:     Scheduled Medications: . allopurinol  300 mg Oral Daily  . amiodarone  400 mg Oral BID  . aspirin EC  325 mg Oral Daily   Or  . aspirin  324 mg Per Tube Daily   Or  . aspirin  300 mg Rectal Daily  . docusate sodium  200 mg Oral Daily  . ferrous fumarate-b12-vitamic C-folic acid  1 capsule Oral TID PC  . lisinopril  10 mg Oral Daily  . pantoprazole  40 mg Oral Daily  . patient's guide to using coumadin book   Does not apply Once  . potassium chloride  40 mEq Oral TID  . sodium chloride  3 mL Intravenous Q12H  . vancomycin  1,500 mg Intravenous Q8H  . warfarin  5 mg Oral ONCE-1800  . Warfarin - Pharmacist Dosing Inpatient   Does not apply q1800    Infusions:  none- received saline bolus for PI event  PRN Medications: sodium chloride, bisacodyl, fentaNYL, HYDROcodone-acetaminophen, ondansetron (ZOFRAN) IV, oxyCODONE, sodium chloride, sodium chloride, traMADol, zolpidem   Assessment:   1. 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 H/o VT  3. NICM  4. Pneumothorax s/p R chest tube 5. Subtherapeutic INR- resolved 6. Abdominal pain- resolved 7. Protein Calorie Malnutrition 8. Acute gout flareup R foot 9. Dark urine w/o change in LDH - LFT' normal-urine now clear Plan/Discussion:    POD # 11. Continues to do well and progressing well.  Tremor from reglan resolved. Elevated WBC w/o fever better, Vanc pending final cultures. Drive line dressing dry.  Followup results of echo,, continue a discharge teaching Reduce diuretics Coumadin per pharmD Follow daily LDH, WBC  I reviewed the LVAD parameters from today, and compared the results to the patient's prior recorded data.  No programming changes were made.  The LVAD is functioning within specified parameters.  The patient is performing the LVAD self-test daily.  LVAD interrogation was negative for any significant power changes, alarms or PI events/speed drops.  LVAD equipment check completed and is in good working order.  Back-up equipment present.   LVAD education done on emergency procedures and precautions and reviewed exit site care.  Length of Stay: 14  VAN TRIGT III,PETER 06/13/2012, 4:06 PM

## 2012-06-13 NOTE — Progress Notes (Addendum)
ANTICOAGULATION CONSULT NOTE - Follow Up Consult  Pharmacy Consult for Coumadin Indication: s/p LVAD  Pharmacy Consult for vancomycin  Indication: leukocytosis, chills  Patient Weight: 92.1 kg  Labs:  Recent Labs  06/11/12 0500 06/11/12 0900 06/12/12 0525 06/13/12 0500  HGB 9.7*  --  9.5* 8.9*  HCT 28.4*  --  28.0* 26.3*  PLT 297  --  315 328  LABPROT 20.8*  --  21.0* 20.6*  INR 1.87*  --  1.89* 1.84*  CREATININE  --  1.06 1.03 1.02   Lab Results  Component Value Date   INR 1.84* 06/13/2012   INR 1.89* 06/12/2012   INR 1.87* 06/11/2012    Assessment: 57 y/o male POD # 11 s/p LVAD on Coumadin. Baseline coagulopathy appears to have resolved and his warfarin requirements may be increasing. INR still just below therapeutic goal. Patient has been very sensitive to small dose changes of warfarin in the past, therefore will increase Coumadin conservatively. Events from yesterday have been noted and with the addition of amiodarone will not titrate warfarin dose up today. No bleeding complications noted   No fevers have been noted and wbc is elevated but stable at 14. Urine and bld cx are no growth to date. Vancomycin trough not able to be drawn yesterday but has been drawn this morning so will adjust dosing as appropriate.  Goal of Therapy:  INR 2-3  Vancomycin trough 15-20    Plan:  Repeat Coumadin 5 mg today with the addition of amio 3/10  Daily INR's, CBC Check vancomycin trough this morning  Sheppard Coil PharmD., BCPS Clinical Pharmacist Pager [phone] 06/13/2012 9:11 AM  Addendum: Vancomycin trough resulted at 11.1 slightly below goal of 15-20 if treating bacteremia.  Will increase dose to 1500mg  IV q8 hours. Will recheck trough in 2-3 days.  Sheppard Coil PharmD., BCPS Clinical Pharmacist Pager [phone] 06/13/2012 11:54 AM .

## 2012-06-13 NOTE — Progress Notes (Signed)
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 Appears that we overdiuresed him and he probably experienced suction event leading to Vtach. Holding diuretics, amio PO started, fluid bolus given and toprol cut back to 25 mg BID. Overnight stable with no arrythmias. MAPs 80-90's. Patient reports feeling shaky overnight with chills and sweats. Denies dizziness, SOB, DOE or pain. Weight about 6-8 pounds under baseline.    INR: 1.84  LVAD INTERROGATION:  HeartMate II LVAD: Flow --- liters/min, speed 10K, power 6.2 , PI 5.9    Objective:    Vital Signs:   Temp:  [98.1 F (36.7 C)-98.6 F (37 C)] 98.6 F (37 C) (03/11 0515) Pulse Rate:  [76-81] 76 (03/11 0515) Resp:  [16-20] 20 (03/11 0515) SpO2:  [95 %-100 %] 95 % (03/11 0515) Weight:  [196 lb 11.2 oz (89.223 kg)] 196 lb 11.2 oz (89.223 kg) (03/11 0515) Last BM Date: 06/11/12 Mean arterial Pressure 96  Intake/Output:   Intake/Output Summary (Last 24 hours) at 06/13/12 0742 Last data filed at 06/13/12 0000  Gross per 24 hour  Intake    942 ml  Output   1700 ml  Net   -758 ml     Physical Exam: General: Laying in the bed ;  No resp difficulty HEENT: normal Neck: supple. JVP flat; Carotids 2+ bilat; no bruits. No lymphadenopathy or thryomegaly appreciated.   Cor:  LVAD hum present  Lungs: clear,  Abdomen: soft, nontender,. + bowel sounds.Securement device intact and dressing C/D/I Extremities: no cyanosis, clubbing, rash, edema Neuro: alert & orientedx3, cranial nerves grossly intact. moves all 4 extremities w/o difficulty. No tremor Telemetry: SR 80-90's  Labs: Basic Metabolic Panel:  Recent Labs Lab 06/07/12 0415  06/08/12 0500 06/09/12 0617 06/10/12 0500 06/11/12 0900 06/12/12 0525  06/13/12 0500  NA 137  < > 136 133* 133* 136 132* 135  K 4.1  < > 3.9 3.5 3.7 3.6 3.3* 4.1  CL 100  < > 96 92* 95* 95* 94* 98  CO2 30  --  28 28 29 30 30 27   GLUCOSE 89  < > 94 114* 107* 127* 128* 102*  BUN 15  < > 13 11 12 13 12 13   CREATININE 0.98  < > 0.98 0.89 0.97 1.06 1.03 1.02  CALCIUM 8.6  --  9.0 9.1 8.6 8.6 8.4 8.4  MG 2.0  --  2.0  --   --   --   --   --   PHOS 3.4  --  3.6  --   --   --   --   --   < > = values in this interval not displayed.  Liver Function Tests:  Recent Labs Lab 06/09/12 0617 06/10/12 0500  AST 21 30  ALT 16 25  ALKPHOS  --  77  BILITOT 1.2 1.0  PROT  --  5.8*  ALBUMIN  --  2.7*   No results found for this basename: LIPASE, AMYLASE,  in the last 168 hours No results found for this basename: AMMONIA,  in the last 168 hours  CBC:  Recent Labs Lab 06/09/12 0617 06/10/12 0500 06/11/12 0500 06/12/12 0525 06/13/12 0500  WBC 15.2* 15.3* 16.1* 14.5* 14.3*  HGB 9.9* 9.0* 9.7* 9.5* 8.9*  HCT 29.1* 26.2* 28.4* 28.0* 26.3*  MCV 84.6 83.7 82.8 83.8 85.1  PLT 240 220 297 315 328    INR:  Recent Labs Lab 06/09/12 0617 06/10/12 0550 06/11/12 0500 06/12/12 0525 06/13/12 0500  INR 2.29* 2.23* 1.87* 1.89* 1.84*    Other results:   Imaging: No results found.   Medications:     Scheduled Medications: . allopurinol  300 mg Oral Daily  . amiodarone  400 mg Oral BID  . aspirin EC  325 mg Oral Daily   Or  . aspirin  324 mg Per Tube Daily   Or  . aspirin  300 mg Rectal Daily  . docusate sodium  200 mg Oral Daily  . ferrous fumarate-b12-vitamic C-folic acid  1 capsule Oral TID PC  . lisinopril  10 mg Oral Daily  . metoprolol succinate  25 mg Oral BID  . pantoprazole  40 mg Oral Daily  . patient's guide to using coumadin book   Does not apply Once  . potassium chloride  40 mEq Oral TID  . sodium chloride  3 mL Intravenous Q12H  . vancomycin  1,000 mg Intravenous Q8H  . Warfarin - Pharmacist Dosing Inpatient   Does not apply  q1800    Infusions:    PRN Medications: sodium chloride, bisacodyl, fentaNYL, HYDROcodone-acetaminophen, ondansetron (ZOFRAN) IV, oxyCODONE, sodium chloride, sodium chloride, traMADol, zolpidem   Assessment:   1. 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 H/o VT  3. NICM  4. Pneumothorax s/p L chest tube 5. Hyponatremia/hypokalemia 6. Tremor  Plan/Discussion:    POD # 11  Overnight experienced some chills and sweats, will continue to monitor and keep vanc going. Weight below baseline will hold diuretics. Multiple PI events overnight, will discuss about turning speed down to 9800. Will obtain limited ECHO today to assess for pericardial effusion after pacing wires pulled yesterday. Encourage fluids and ambulation with assistance.   Change from toprol to coreg BID.   Family coming back up today for education and practice dressing change. Goal still set for patient to go home end of next week.   INR 1.84, pharmacy to dose.   I reviewed the LVAD parameters from today, and compared the results to the patient's prior recorded data.  No programming changes were made.  The LVAD is functioning within specified parameters.  The patient/staff is performing the LVAD self-test daily.  LVAD interrogation was negative for any significant power changes or alarms. Multiple PI events overnight will continue to monitor. LVAD equipment check completed and is in good working order.  Back-up equipment present.   LVAD education done on emergency procedures and precautions and reviewed exit site care.  Length of Stay: [address]  [name] 06/13/2012 7:42 AM   Patient seen and examined with Ulla Potash, NP. We discussed all aspects of the encounter. I agree with the assessment and plan as stated above.   Overall progressing. Diuretics now on hold. Reports subjective chills but no fevers. WBC coming down slowly on Vanc. INR still subtherapeutic.  Agree with switching Toprol to carvedilol. Continue  VAD education and ambulation. Adjust coumadin. Check cultures.   Obe Ahlers,MD 6:05 PM

## 2012-06-13 NOTE — Progress Notes (Signed)
Pt ambualted 800 feet x1 assist. Pt tolerated well, steady gait, pt back in room call bell within reach. Will monitor patient. Cloer, Randall An RN

## 2012-06-13 NOTE — Progress Notes (Signed)
NUTRITION FOLLOW UP  Intervention:    No nutrition intervention at this time ---> patient declined  RD to continue to follow  Nutrition Dx:   Inadequate oral intake, resolved  Goal:   Oral intake to meet >/= 90% of estimated nutrition needs, progressing  Monitor:   PO intake, weight, labs, I/O's  Assessment:   Patient s/p procedure 2/28:  INSERTION OF IMPLANTABLE LEFT VENTRICULAR ASSIST DEVICE  INTRAOPERATIVE TRANSESOPHAGEAL ECHOCARDIOGRAM   Diet advanced to Heart Healthy 3/6.  Patient states his appetite goes "up and down".  Reports he ate breakfast well this AM.  PO intake 75-100% per flowsheet records.  Declining addition of nutrition supplements at this time.  Height: Ht Readings from Last 1 Encounters:  05/30/12 6\' 2"  (1.88 m)    Weight Status:   Wt Readings from Last 1 Encounters:  06/13/12 196 lb 11.2 oz (89.223 kg)    Re-estimated needs:  Kcal: 2200-2400 Protein: 100-115 gm Fluid: 1.5 L fluid restriction  Skin: Intact  Diet Order: Cardiac   Intake/Output Summary (Last 24 hours) at 06/13/12 1109 Last data filed at 06/13/12 0857  Gross per 24 hour  Intake    702 ml  Output   1950 ml  Net  -1248 ml    Last BM: 3/9  Labs:   Recent Labs Lab 06/07/12 0415  06/08/12 0500  06/11/12 0900 06/12/12 0525 06/13/12 0500  NA 137  < > 136  < > 136 132* 135  K 4.1  < > 3.9  < > 3.6 3.3* 4.1  CL 100  < > 96  < > 95* 94* 98  CO2 30  --  28  < > 30 30 27   BUN 15  < > 13  < > 13 12 13   CREATININE 0.98  < > 0.98  < > 1.06 1.03 1.02  CALCIUM 8.6  --  9.0  < > 8.6 8.4 8.4  MG 2.0  --  2.0  --   --   --   --   PHOS 3.4  --  3.6  --   --   --   --   GLUCOSE 89  < > 94  < > 127* 128* 102*  < > = values in this interval not displayed.   Scheduled Meds: . allopurinol  300 mg Oral Daily  . amiodarone  400 mg Oral BID  . aspirin EC  325 mg Oral Daily   Or  . aspirin  324 mg Per Tube Daily   Or  . aspirin  300 mg Rectal Daily  . docusate sodium  200 mg  Oral Daily  . ferrous fumarate-b12-vitamic C-folic acid  1 capsule Oral TID PC  . lisinopril  10 mg Oral Daily  . metoprolol succinate  25 mg Oral BID  . pantoprazole  40 mg Oral Daily  . patient's guide to using coumadin book   Does not apply Once  . potassium chloride  40 mEq Oral TID  . sodium chloride  3 mL Intravenous Q12H  . vancomycin  1,000 mg Intravenous Q8H  . warfarin  5 mg Oral ONCE-1800  . Warfarin - Pharmacist Dosing Inpatient   Does not apply q1800    Continuous Infusions:   Maureen Chatters, RD, LDN Pager #: [phone] After-Hours Pager #: [phone]

## 2012-06-13 NOTE — Progress Notes (Signed)
CARDIAC REHAB PHASE I   PRE:  Rate/Rhythm: 89 SR    BP: sitting 90 MAP    SaO2: 100 RA  MODE:  Ambulation: 1240 ft   POST:  Rate/Rhythm: 110 ST    BP: sitting 90 MAP     SaO2: wouldn't register  Tolerated great. No c/o. Likes using RW but sts he will not need it at home. Will walk without RW tomorrow or Thurs. Very upbeat, VSS. To recliner. [id number]   Elissa Lovett [place] CES, ACSM 06/13/2012 2:33 PM

## 2012-06-13 NOTE — Progress Notes (Signed)
  Echocardiogram 2D Echocardiogram limited has been performed.  BROWN, CALEBA 06/13/2012, 3:36 PM

## 2012-06-13 NOTE — Progress Notes (Signed)
Pt ambulated 800 ft x 1 assist, with walker and back up batteries. Pt tolerated well, and is now resting in room with call bell within reach. Will continue to monitor.

## 2012-06-13 NOTE — Progress Notes (Signed)
VAD Coordinator Note:  Patient with mother, father, nephew and [name]) were provided education today on the LVAD. We discussed aspects such as follow up care, medications, equipment, who to call for emergencies, who to call for general questions (appointments, medication refills, etc), and the dressing care. His family watched me perform at the bedside the sterile driveline dressing and were provided with a handout on instructions and pictures of how to perform the dressing. His [name] is going to be there primary caregiver and will return tomorrow to practice the dressing. We also discussed extensively how to take his coumadin, what foods make the blood thicker and thinner and the importance of this medication.   [name], NP-C 4:13 PM

## 2012-06-13 NOTE — Progress Notes (Signed)
Pt MAP 96, asymptomatic, MD on call notified and made aware, no orders given, will continue to monitor.

## 2012-06-13 NOTE — Progress Notes (Signed)
OT Cancellation Note  Patient Details Name: Robert [name] MRN: [id number] DOB: [birth year]   Cancelled Treatment:    Reason Eval/Treat Not Completed: Patient at procedure or test/ unavailable (echo)  [facility] Ward, OTR/L  [phone] 06/13/2012 06/13/2012, 3:46 PM

## 2012-06-14 ENCOUNTER — Inpatient Hospital Stay (HOSPITAL_COMMUNITY): Payer: BC Managed Care – PPO

## 2012-06-14 DIAGNOSIS — I428 Other cardiomyopathies: Secondary | ICD-10-CM

## 2012-06-14 DIAGNOSIS — I5023 Acute on chronic systolic (congestive) heart failure: Secondary | ICD-10-CM

## 2012-06-14 DIAGNOSIS — Z95818 Presence of other cardiac implants and grafts: Secondary | ICD-10-CM

## 2012-06-14 LAB — CBC
HCT: 26 % — ABNORMAL LOW (ref 39.0–52.0)
Platelets: 350 10*3/uL (ref 150–400)
RBC: 3.13 MIL/uL — ABNORMAL LOW (ref 4.22–5.81)
RDW: 14.6 % (ref 11.5–15.5)
WBC: 14 10*3/uL — ABNORMAL HIGH (ref 4.0–10.5)

## 2012-06-14 LAB — PROTIME-INR: INR: 1.79 — ABNORMAL HIGH (ref 0.00–1.49)

## 2012-06-14 LAB — BASIC METABOLIC PANEL
BUN: 11 mg/dL (ref 6–23)
CO2: 24 mEq/L (ref 19–32)
Calcium: 8.5 mg/dL (ref 8.4–10.5)
Chloride: 102 mEq/L (ref 96–112)
Creatinine, Ser: 0.95 mg/dL (ref 0.50–1.35)
GFR calc Af Amer: >90 mL/min (ref >90)
GFR calc non Af Amer: >90 mL/min (ref >90)
Glucose, Bld: 93 mg/dL (ref 70–99)
Potassium: 4.6 mEq/L (ref 3.5–5.1)
Sodium: 134 mEq/L — ABNORMAL LOW (ref 135–145)

## 2012-06-14 LAB — LACTATE DEHYDROGENASE: LDH: 319 U/L — ABNORMAL HIGH (ref 94–250)

## 2012-06-14 MED ORDER — LISINOPRIL 10 MG PO TABS
10.0000 mg | ORAL_TABLET | Freq: Two times a day (BID) | ORAL | Status: DC
  Administered 2012-06-14 (×2): 10 mg via ORAL
  Filled 2012-06-14 (×4): qty 1

## 2012-06-14 MED ORDER — FE FUMARATE-B12-VIT C-FA-IFC PO CAPS
1.0000 | ORAL_CAPSULE | Freq: Two times a day (BID) | ORAL | Status: DC
  Administered 2012-06-15 – 2012-06-16 (×3): 1 via ORAL
  Filled 2012-06-14 (×5): qty 1

## 2012-06-14 MED ORDER — WARFARIN SODIUM 5 MG PO TABS
5.0000 mg | ORAL_TABLET | Freq: Once | ORAL | Status: AC
  Administered 2012-06-14: 5 mg via ORAL
  Filled 2012-06-14: qty 1

## 2012-06-14 MED ORDER — POTASSIUM CHLORIDE CRYS ER 20 MEQ PO TBCR
40.0000 meq | EXTENDED_RELEASE_TABLET | Freq: Every day | ORAL | Status: DC
  Administered 2012-06-14 – 2012-06-16 (×3): 40 meq via ORAL
  Filled 2012-06-14 (×2): qty 2

## 2012-06-14 MED ORDER — ENOXAPARIN SODIUM 100 MG/ML ~~LOC~~ SOLN
1.0000 mg/kg | Freq: Two times a day (BID) | SUBCUTANEOUS | Status: DC
  Administered 2012-06-14 (×2): 95 mg via SUBCUTANEOUS
  Filled 2012-06-14 (×4): qty 1

## 2012-06-14 NOTE — Progress Notes (Signed)
Pt MAP 102, . Patient resting in bed, Ulla Potash [facility] made aware, no new orders received at this time will continue to closely monitor patient Cloer, Randall An RN

## 2012-06-14 NOTE — Progress Notes (Signed)
Physical Therapy Treatment Patient Details Name: Robert [name] MRN: [id number] DOB: [birth year] Today's Date: 06/14/2012 Time: 3086-5784 PT Time Calculation (min): 25 min  PT Assessment / Plan / Recommendation Comments on Treatment Session  Pt s/p LVAD.  Continues to progress ambulation.  Able to go up and down steps.  Pt manipulating equipment independently.      Follow Up Recommendations  Home health PT;Supervision/Assistance - 24 hour                 Equipment Recommendations  None recommended by PT;Other (comment) (pt does not want a rollator.  )       Frequency Min 3X/week   Plan Discharge plan remains appropriate;Frequency remains appropriate    Precautions / Restrictions Precautions Precautions: Sternal;Fall Precaution Comments: LVAD Restrictions Weight Bearing Restrictions: No   Pertinent Vitals/Pain VSS, No pain    Mobility  Bed Mobility Bed Mobility: Not assessed Transfers Transfers: Sit to Stand;Stand to Sit Sit to Stand: 5: Supervision;Without upper extremity assist;From chair/3-in-1 Stand to Sit: 5: Supervision;Without upper extremity assist;To chair/3-in-1 Stand Pivot Transfers: Not tested (comment) Details for Transfer Assistance: Pt needed cues to not use UEs on chair for sternal precautions.   Ambulation/Gait Ambulation/Gait Assistance: 4: Min guard Ambulation Distance (Feet): 550 Feet Assistive device: None Ambulation/Gait Assistance Details: Pt ambulating well.  Pushed IV pole at times but can ambulate wtihout device.  DOE 2/4.  Encouraged deep breathing.   Gait Pattern: Step-through pattern;Within Functional Limits Gait velocity: improving Stairs: Yes Stairs Assistance: 5: Supervision Stairs Assistance Details (indicate cue type and reason): No assist or cues needed.   Stair Management Technique: One rail Left;Forwards Number of Stairs: 8 Wheelchair Mobility Wheelchair Mobility: No     PT Goals Acute Rehab PT Goals PT Goal: Sit to Stand  - Progress: Progressing toward goal PT Goal: Ambulate - Progress: Progressing toward goal PT Goal: Up/Down Stairs - Progress: Met Additional Goals PT Goal: Additional Goal #1 - Progress: Progressing toward goal PT Goal: Additional Goal #2 - Progress: Met  Visit Information  Last PT Received On: 06/14/12 Assistance Needed: +1    Subjective Data  Subjective: "I don't need that walker."   Cognition  Cognition Overall Cognitive Status: Appears within functional limits for tasks assessed/performed Arousal/Alertness: Awake/alert Orientation Level: Appears intact for tasks assessed Behavior During Session: [facility] for tasks performed    Balance  Static Sitting Balance Static Sitting - Level of Assistance: Not tested (comment) Static Standing Balance Static Standing - Balance Support: Bilateral upper extremity supported;During functional activity Static Standing - Level of Assistance: 5: Stand by assistance Static Standing - Comment/# of Minutes: 3  End of Session PT - End of Session Equipment Utilized During Treatment: Gait belt Activity Tolerance: Patient tolerated treatment well Patient left: in chair;with call bell/phone within reach Nurse Communication: Mobility status       INGOLD,DAWN 06/14/2012, 9:52 AM  Audree Camel Acute Rehabilitation [phone] (pager)

## 2012-06-14 NOTE — Progress Notes (Signed)
HeartFailure/VAD 2 Rounding Note  Subjective:    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 Ambulating.  Stable overnight. MAPs slightly elevated 90-96. Weight up 8 lbs, appears now at baseline. Had 8 beat run of Vtach last night, but this time he could not feel it. Remains of amiodarone 400 BID. Denies any SOB/DOE or orhtopnea. Complains of difficulty sleeping. Urinalysis showerd urobilinogen and few bacteria and hyaline casts. WBC remains unchanged 14, on vancomycin.  INR: 1.79  LVAD INTERROGATION:  HeartMate II LVAD: Flow 6.2liters/min, speed 10K, power 6.9 , PI 5.7   Objective:    Vital Signs:   Temp:  [98.4 F (36.9 C)-98.7 F (37.1 C)] 98.7 F (37.1 C) (03/12 0433) Resp:  [18] 18 (03/12 0433) SpO2:  [98 %-100 %] 100 % (03/12 0433) Weight:  [204 lb 2.3 oz (92.6 kg)] 204 lb 2.3 oz (92.6 kg) (03/12 0433) Last BM Date: 06/13/12 Mean arterial Pressure 96  Intake/Output:   Intake/Output Summary (Last 24 hours) at 06/14/12 0723 Last data filed at 06/13/12 1700  Gross per 24 hour  Intake   2020 ml  Output    250 ml  Net   1770 ml     Physical Exam: General: Sitting in the chair eating breakfast; NAD HEENT: normal Neck: supple. JVP flat; Carotids 2+ bilat; no bruits. No lymphadenopathy or thryomegaly appreciated.   Cor:  LVAD hum present  Lungs: clear,  Abdomen: soft, nontender,. + bowel sounds.Securement device intact and dressing C/D/I Extremities: no cyanosis, clubbing, rash, edema Neuro: alert & orientedx3, cranial nerves grossly intact. moves all 4 extremities w/o difficulty. No tremor Telemetry: SR 94 bpm  Labs: Basic Metabolic Panel:  Recent Labs Lab 06/08/12 0500  06/10/12 0500 06/11/12 0900 06/12/12 0525 06/13/12 0500 06/14/12 0455  NA 136  < > 133* 136 132* 135 134*  K 3.9  < > 3.7 3.6 3.3* 4.1 4.6  CL 96  < > 95* 95*  94* 98 102  CO2 28  < > 29 30 30 27 24   GLUCOSE 94  < > 107* 127* 128* 102* 93  BUN 13  < > 12 13 12 13 11   CREATININE 0.98  < > 0.97 1.06 1.03 1.02 0.95  CALCIUM 9.0  < > 8.6 8.6 8.4 8.4 8.5  MG 2.0  --   --   --   --   --   --   PHOS 3.6  --   --   --   --   --   --   < > = values in this interval not displayed.  Liver Function Tests:  Recent Labs Lab 06/09/12 0617 06/10/12 0500  AST 21 30  ALT 16 25  ALKPHOS  --  77  BILITOT 1.2 1.0  PROT  --  5.8*  ALBUMIN  --  2.7*   No results found for this basename: LIPASE, AMYLASE,  in the last 168 hours No results found for this basename: AMMONIA,  in the last 168 hours  CBC:  Recent Labs Lab 06/10/12 0500 06/11/12 0500 06/12/12 0525 06/13/12 0500 06/14/12 0455  WBC 15.3* 16.1* 14.5* 14.3* 14.0*  HGB 9.0* 9.7* 9.5* 8.9* 8.9*  HCT 26.2* 28.4* 28.0* 26.3* 26.0*  MCV 83.7 82.8 83.8 85.1 83.1  PLT 220 297 315 328 350    INR:  Recent Labs  Lab 06/10/12 0550 06/11/12 0500 06/12/12 0525 06/13/12 0500 06/14/12 0455  INR 2.23* 1.87* 1.89* 1.84* 1.79*    Other results:   Imaging: No results found.   Medications:     Scheduled Medications: . allopurinol  300 mg Oral Daily  . amiodarone  400 mg Oral BID  . aspirin EC  325 mg Oral Daily   Or  . aspirin  324 mg Per Tube Daily   Or  . aspirin  300 mg Rectal Daily  . docusate sodium  200 mg Oral Daily  . ferrous fumarate-b12-vitamic C-folic acid  1 capsule Oral TID PC  . lisinopril  10 mg Oral Daily  . pantoprazole  40 mg Oral Daily  . patient's guide to using coumadin book   Does not apply Once  . potassium chloride  40 mEq Oral TID  . sodium chloride  3 mL Intravenous Q12H  . vancomycin  1,500 mg Intravenous Q8H  . Warfarin - Pharmacist Dosing Inpatient   Does not apply q1800    Infusions:    PRN Medications: sodium chloride, bisacodyl, fentaNYL, HYDROcodone-acetaminophen, ondansetron (ZOFRAN) IV, oxyCODONE, sodium chloride, sodium chloride, traMADol,  zolpidem   Assessment:   1. 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 H/o VT  3. NICM  4. Pneumothorax s/p L chest tube 5. Hyponatremia/hypokalemia 6. Tremor  Plan/Discussion:    POD # 12  Stable overnight and now back to baseline weight. Will restart diuretics 20 mg lasix PO and monitor. Blood pressure slightly elevated will titrate lisinopril and consider starting BB later. Add 5 mg Ambien QHS PRN for help with sleeping.   INR remains therapeutc, 1.79 pharmacy to dose.   Will continue extensive education today with family and patient.   I reviewed the LVAD parameters from today, and compared the results to the patient's prior recorded data.  No programming changes were made.  The LVAD is functioning within specified parameters.  The patient is performing the LVAD self-test daily.  LVAD interrogation was negative for any significant power changes or alarms. 1 PI event. LVAD equipment check completed and is in good working order.  Back-up equipment present.   LVAD education done on emergency procedures and precautions and reviewed exit site care.  Length of Stay: [address]  [name], NP-C 06/14/2012 7:23 AM  Patient seen and examined with Ulla Potash, NP. We discussed all aspects of the encounter. I agree with the assessment and plan as stated above.   Much improved. Would continue to hold diuretics and stop b-blocker. I did not see his NSVT on tele despite looking closely. Will continue amio for now. Reduce dose at d/c to 400 daily for 2 weeks then 200 daily. Increase lisinopril for elevated MAPs.   INR still low. Need to adjust coumadin.   Only 1 PI event overnight. Will review echo today. Keep speed at 10K for now.   Continue education. Hopefully home on Friday,  Daniel Bensimhon,MD 8:22 AM

## 2012-06-14 NOTE — Progress Notes (Signed)
ANTICOAGULATION CONSULT NOTE - Follow Up Consult  Pharmacy Consult for Coumadin Indication: s/p LVAD  Pharmacy Consult for vancomycin  Indication: leukocytosis, chills  Patient Weight: 92.6 kg  Labs:  Recent Labs  06/12/12 0525 06/13/12 0500 06/14/12 0455  HGB 9.5* 8.9* 8.9*  HCT 28.0* 26.3* 26.0*  PLT 315 328 350  LABPROT 21.0* 20.6* 20.2*  INR 1.89* 1.84* 1.79*  CREATININE 1.03 1.02 0.95   Lab Results  Component Value Date   INR 1.79* 06/14/2012   INR 1.84* 06/13/2012   INR 1.89* 06/12/2012    Assessment: 57 y/o male POD # 12 s/p LVAD on Coumadin. Baseline coagulopathy appears to have resolved and his warfarin requirements are increasing despite the addition of amiodarone 3/10. INR still just below therapeutic goal. Have to expect that the coagulopathy was the cause of sudden rise in INR and now that it is resolved and diet is improved we are seeing increased requirements. D/w Elsie Ra NP and will start lovenox bridge today until INR responds to current dosing.  Patient has been very sensitive to small dose changes of warfarin in the past, therefore dosing conservatively. Asymptomatic VT event noted over night.   Empiric abx coverage; no fevers noted past 24h, wbc stable at 14, u/a for 3/11 shows a few bacteria but cultures have been negative to date. Renal function stable.  Goal of Therapy:  INR 2-3  Vancomycin trough 15-20    Plan:  Repeat Coumadin 5 mg today with the addition of amio 3/10  Daily INR's, CBC Lovenox 90mg  q12 hours -should be able to d/c in am if INR>2    Sheppard Coil PharmD., BCPS Clinical Pharmacist Pager [phone] 06/14/2012 7:42 AM

## 2012-06-14 NOTE — Progress Notes (Signed)
Pt ambulated 1200 ft on rm air, pt tolerated activity well and is now resting in the chair.  Robert [name]

## 2012-06-14 NOTE — Progress Notes (Signed)
CARDIAC REHAB PHASE I   PRE:  Rate/Rhythm: 99 SR  BP:  Supine:   Sitting: 94 Dopplered  Standing:    SaO2: 100 RA  MODE:  Ambulation: 1240 ft   POST:  Rate/Rhythm: 118  BP:  Supine:   Sitting: 96 Dopplered  Standing:    SaO2: 100 RA 1405-1445 Assisted X 1 to ambulate. Gait steady VS stable. Pt very motivated to ambulate. He walked 1240 feet. Pt to recliner after walk with call light in reach.   Melina Copa RN 06/14/2012 2:40 PM

## 2012-06-14 NOTE — Progress Notes (Signed)
Pt weighed twice this am on same scale, result was the same at 92.6 kg, standing.

## 2012-06-14 NOTE — Progress Notes (Signed)
Pt had 8 beats of V-tach, asymptomatic, pt stable, will continue to monitor.

## 2012-06-14 NOTE — Progress Notes (Signed)
HeartMate 2 Rounding Note  Subjective:    Mr. 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 Cath 2011 showed normal coronaries. He also has a history of VTach.   He is followed very closely in Heart Failure Clinic and In December was referred to the Transplant Clinic at [facility] as his symptoms began to worsen, we were unable to titrate meds due to hypotension and frequent NSVT. He underwent transplant evaluation, RHC showed low output with high filling pressures. He was diuresed and placed on home inotropes. He is on the transplant list and was having worsening HF symptoms when he was admitted to the hospital for bridge VAD on 05/30/12/  LVAD POD #12 Recent episodes of PI events associated with V. tach and low flow reading on the cystoscopy monitor associated with dehydration, increased beta blocker for blood pressure control and weaning off milrinone. The patient's diuretics were stopped and he was given saline IV fluid yesterday with increase in weight to his baseline and significant improvement in his symptoms. Only one PI event overnight. 2-D echo performed yesterday shows no significant pericardial effusion with the aortic valve not opening and fairly well maintained neutral septum position.  The patient walked 1250 feet today with physical therapy-rehabilitation and is feeling significantly better. His white count is stable at 14,000. Cultures are negative. Surgical incisions are clean and dry.   His INR is still subtherapeutic and he will be given a Lovenox bridge 1 mg per kilogram every 12 hours until INR greater than 2.0  His increased mean arterial pressure will be treated with increasing lisinopril to 10 mg twice a day. Consideration of adding Corag will be made pending his response to lisinopril.  The patient was started on oral amiodarone 400 mg  by mouth twice a day due to his prior history of V. tach and episodes of V. tach during the past 48 hours.  Physical exam Patient is afebrile, mean arterial pressure 90-100 Lungs are clear to auscultation Surgical incisions are clean and dry No evidence of peripheral edema Urine is clear, cultures remained negative. Day 4 vancomycin .   INR 1.8    coumadin per PharmD  LVAD INTERROGATION:  HeartMate II LVAD:  Flow 5.2 liters/min, speed 10,000 power 5.3, PI 4.3  Objective:    Vital Signs:   Temp:  [98.7 F (37.1 C)] 98.7 F (37.1 C) (03/12 0433) Resp:  [18] 18 (03/12 1254) SpO2:  [100 %] 100 % (03/12 1254) Weight:  [204 lb 2.3 oz (92.6 kg)] 204 lb 2.3 oz (92.6 kg) (03/12 0433) Last BM Date: 06/13/12 Mean arterial Pressure 72-86  Intake/Output:  No intake or output data in the 24 hours ending 06/14/12 1841   Physical Exam:  Normal sinus rhythm, weight at baseline Surgical incisions dry and clean, lungs clear PICC line clean and dry Lungs clear No peripheral edema  General: Sitting on side of bed; No resp difficulty HEENT: normal Neck: supple. JVP 8-9 . Carotids 2+ bilat; no bruits. No lymphadenopathy or thryomegaly appreciated.; Cor: Mechanical heart sounds with LVAD hum present, with soft heart sounds Lungs: clear, diminshed in the bases; + pocket drain Abdomen: soft, tender, not distended. No hepatosplenomegaly. No bruits or masses. + bowel sounds. Securement device intact and dressing C/D/I Extremities: no cyanosis, clubbing, rash, edema, R UE 2L PICC Neuro: alert & orientedx3, cranial nerves grossly  intact. moves all 4 extremities w/o difficulty. Affect pleasant  Telemetry: Sinus rhythm 84  Labs: Basic Metabolic Panel:  Recent Labs Lab 06/08/12 0500  06/10/12 0500 06/11/12 0900 06/12/12 0525 06/13/12 0500 06/14/12 0455  NA 136  < > 133* 136 132* 135 134*  K 3.9  < > 3.7 3.6 3.3* 4.1 4.6  CL 96  < > 95* 95* 94* 98 102  CO2 28  < > 29 30 30 27 24    GLUCOSE 94  < > 107* 127* 128* 102* 93  BUN 13  < > 12 13 12 13 11   CREATININE 0.98  < > 0.97 1.06 1.03 1.02 0.95  CALCIUM 9.0  < > 8.6 8.6 8.4 8.4 8.5  MG 2.0  --   --   --   --   --   --   PHOS 3.6  --   --   --   --   --   --   < > = values in this interval not displayed.  Liver Function Tests:  Recent Labs Lab 06/09/12 0617 06/10/12 0500  AST 21 30  ALT 16 25  ALKPHOS  --  77  BILITOT 1.2 1.0  PROT  --  5.8*  ALBUMIN  --  2.7*    CBC:  Recent Labs Lab 06/10/12 0500 06/11/12 0500 06/12/12 0525 06/13/12 0500 06/14/12 0455  WBC 15.3* 16.1* 14.5* 14.3* 14.0*  HGB 9.0* 9.7* 9.5* 8.9* 8.9*  HCT 26.2* 28.4* 28.0* 26.3* 26.0*  MCV 83.7 82.8 83.8 85.1 83.1  PLT 220 297 315 328 350    INR:  Recent Labs Lab 06/10/12 0550 06/11/12 0500 06/12/12 0525 06/13/12 0500 06/14/12 0455  INR 2.23* 1.87* 1.89* 1.84* 1.79*    Other results:  EKG: sinus tach  Imaging: Dg Chest 2 View  06/14/2012  *RADIOLOGY REPORT*  Clinical Data: Status post left ventricular assist device placement.  CHEST - 2 VIEW  Comparison: PA and lateral chest 06/11/2012.  Findings: Right PICC, AICD and left ventricular assist device are all unchanged.  There is cardiomegaly.  No pulmonary edema.  Mild atelectasis left lung base is noted.  IMPRESSION: No acute finding.   Original Report Authenticated By: Holley Dexter, M.D.      Medications:     Scheduled Medications: . allopurinol  300 mg Oral Daily  . amiodarone  400 mg Oral BID  . aspirin EC  325 mg Oral Daily   Or  . aspirin  324 mg Per Tube Daily   Or  . aspirin  300 mg Rectal Daily  . docusate sodium  200 mg Oral Daily  . enoxaparin (LOVENOX) injection  1 mg/kg Subcutaneous Q12H  . ferrous fumarate-b12-vitamic C-folic acid  1 capsule Oral TID PC  . lisinopril  10 mg Oral BID  . pantoprazole  40 mg Oral Daily  . patient's guide to using coumadin book   Does not apply Once  . potassium chloride  40 mEq Oral Daily  . sodium  chloride  3 mL Intravenous Q12H  . vancomycin  1,500 mg Intravenous Q8H  . Warfarin - Pharmacist Dosing Inpatient   Does not apply q1800    Infusions:  none- received saline bolus for PI event  PRN Medications: sodium chloride, bisacodyl, fentaNYL, HYDROcodone-acetaminophen, ondansetron (ZOFRAN) IV, oxyCODONE, sodium chloride, sodium chloride, traMADol, zolpidem   Assessment:   1. 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 H/o VT  3. NICM  4. Pneumothorax s/p R chest tube  5. Subtherapeutic INR- resolved 6. Abdominal pain- resolved 7. Protein Calorie Malnutrition 8. Acute gout flareup R foot 9. Dark urine w/o change in LDH - LFT' normal-urine now clear Plan/Discussion:    POD # 11. Continue current care We'll progress with discharge teaching and dry line dressing changes with family. Lovenox bridge until INR becomes therapeutic Continue amiodaronef or recent episodes of VT  Stop vancomycin tomorrow all cultures returned negative And for discharge postop day 14   I reviewed the LVAD parameters from today, and compared the results to the patient's prior recorded data.  No programming changes were made.  The LVAD is functioning within specified parameters.  The patient is performing the LVAD self-test daily.  LVAD interrogation was negative for any significant power changes, alarms or PI events/speed drops.  LVAD equipment check completed and is in good working order.  Back-up equipment present.   LVAD education done on emergency procedures and precautions and reviewed exit site care.  Length of Stay: 15  VAN TRIGT III,Jeremey Bascom 06/14/2012, 6:41 PM

## 2012-06-15 DIAGNOSIS — Z95818 Presence of other cardiac implants and grafts: Secondary | ICD-10-CM

## 2012-06-15 DIAGNOSIS — I428 Other cardiomyopathies: Secondary | ICD-10-CM

## 2012-06-15 DIAGNOSIS — I5023 Acute on chronic systolic (congestive) heart failure: Secondary | ICD-10-CM

## 2012-06-15 LAB — CBC
HCT: 25.3 % — ABNORMAL LOW (ref 39.0–52.0)
MCH: 28.8 pg (ref 26.0–34.0)
MCHC: 34 g/dL (ref 30.0–36.0)
MCV: 84.6 fL (ref 78.0–100.0)
RDW: 14.5 % (ref 11.5–15.5)

## 2012-06-15 LAB — BASIC METABOLIC PANEL
BUN: 10 mg/dL (ref 6–23)
CO2: 22 mEq/L (ref 19–32)
Calcium: 8.3 mg/dL — ABNORMAL LOW (ref 8.4–10.5)
Chloride: 105 mEq/L (ref 96–112)
Creatinine, Ser: 0.97 mg/dL (ref 0.50–1.35)
GFR calc Af Amer: >90 mL/min (ref >90)
GFR calc non Af Amer: >90 mL/min (ref >90)
Glucose, Bld: 87 mg/dL (ref 70–99)
Potassium: 4.2 mEq/L (ref 3.5–5.1)
Sodium: 136 mEq/L (ref 135–145)

## 2012-06-15 LAB — LACTATE DEHYDROGENASE: LDH: 291 U/L — ABNORMAL HIGH (ref 94–250)

## 2012-06-15 MED ORDER — FUROSEMIDE 40 MG PO TABS
40.0000 mg | ORAL_TABLET | Freq: Every day | ORAL | Status: DC
  Administered 2012-06-15 – 2012-06-16 (×2): 40 mg via ORAL
  Filled 2012-06-15 (×2): qty 1

## 2012-06-15 MED ORDER — WARFARIN SODIUM 5 MG PO TABS
5.0000 mg | ORAL_TABLET | Freq: Once | ORAL | Status: AC
  Administered 2012-06-15: 5 mg via ORAL
  Filled 2012-06-15: qty 1

## 2012-06-15 MED ORDER — LISINOPRIL 40 MG PO TABS
40.0000 mg | ORAL_TABLET | Freq: Every day | ORAL | Status: DC
  Administered 2012-06-15 – 2012-06-16 (×2): 40 mg via ORAL
  Filled 2012-06-15 (×2): qty 1

## 2012-06-15 MED ORDER — FUROSEMIDE 20 MG PO TABS
20.0000 mg | ORAL_TABLET | Freq: Every day | ORAL | Status: DC
  Filled 2012-06-15: qty 1

## 2012-06-15 NOTE — Progress Notes (Signed)
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 Cath 2011 showed normal coronaries. He also has a history of VTach.   He is followed very closely in Heart Failure Clinic and In December was referred to the Transplant Clinic at [facility] as his symptoms began to worsen, we were unable to titrate meds due to hypotension and frequent NSVT. He underwent transplant evaluation, RHC showed low output with high filling pressures. He was diuresed and placed on home inotropes. He is on the transplant list and was having worsening HF symptoms when he was admitted to the hospital for bridge VAD on 05/30/12/  LVAD POD #13 Recent episodes of PI events associated with V. tach and low flow reading on the system monitor associated with dehydration, increased beta blocker for blood pressure control. Fluids liberalized and beta blockers stopped. BP control with lisinopril and norvasc. Lasix resumed as weight is now slightly above baseline. Only one PI event overnight. No more VT after amiodarone started  The patient walked 1250 feet today with physical therapy-rehabilitation and is feeling significantly better. His white count is stable at 14,000. Cultures are negative. Surgical incisions are clean and dry.Vanco will stop today   His INR is now > 2.0, stop lovenox and cont coumadin  Physical exam Patient is afebrile, mean arterial pressure 90-100 Lungs are clear to auscultation Surgical incisions are clean and dry No evidence of peripheral edema  .   INR 2.0    coumadin per PharmD  LVAD INTERROGATION:  HeartMate II LVAD:  Flow 5.2 liters/min, speed 10,000 power 5.3, PI 4.3  Objective:    Vital Signs:   Temp:  [97.2 F (36.2 C)-97.8 F (36.6 C)] 97.2 F (36.2 C) (03/13 0610) Resp:  [16-18] 16 (03/13 0610) SpO2:  [100  %] 100 % (03/13 0610) Weight:  [207 lb 14.3 oz (94.3 kg)] 207 lb 14.3 oz (94.3 kg) (03/13 0455) Last BM Date: 06/14/12 Mean arterial Pressure 72-86  Intake/Output:   Intake/Output Summary (Last 24 hours) at 06/15/12 0953 Last data filed at 06/14/12 2000  Gross per 24 hour  Intake      0 ml  Output    300 ml  Net   -300 ml     Physical Exam:  Normal sinus rhythm, weight at baseline Surgical incisions dry and clean, lungs clear PICC line clean and dry Lungs clear No peripheral edema  General: Sitting on side of bed; No resp difficulty HEENT: normal Neck: supple. JVP 8-9 . Carotids 2+ bilat; no bruits. No lymphadenopathy or thryomegaly appreciated.; Cor: Mechanical heart sounds with LVAD hum present, with soft heart sounds Lungs: clear, diminshed in the bases; + pocket drain Abdomen: soft, tender, not distended. No hepatosplenomegaly. No bruits or masses. + bowel sounds. Securement device intact and dressing C/D/I Extremities: no cyanosis, clubbing, rash, edema, R UE 2L PICC Neuro: alert & orientedx3, cranial nerves grossly intact. moves all 4 extremities w/o difficulty. Affect pleasant  Telemetry: Sinus rhythm 88  Labs: Basic Metabolic Panel:  Recent Labs Lab 06/11/12 0900 06/12/12 0525 06/13/12 0500 06/14/12 0455 06/15/12 0545  NA 136 132* 135 134* 136  K 3.6 3.3* 4.1 4.6 4.2  CL 95* 94* 98 102 105  CO2 30 30 27 24 22   GLUCOSE 127* 128* 102* 93 87  BUN 13 12  13 11 10   CREATININE 1.06 1.03 1.02 0.95 0.97  CALCIUM 8.6 8.4 8.4 8.5 8.3*    Liver Function Tests:  Recent Labs Lab 06/09/12 0617 06/10/12 0500  AST 21 30  ALT 16 25  ALKPHOS  --  77  BILITOT 1.2 1.0  PROT  --  5.8*  ALBUMIN  --  2.7*    CBC:  Recent Labs Lab 06/11/12 0500 06/12/12 0525 06/13/12 0500 06/14/12 0455 06/15/12 0545  WBC 16.1* 14.5* 14.3* 14.0* 13.3*  HGB 9.7* 9.5* 8.9* 8.9* 8.6*  HCT 28.4* 28.0* 26.3* 26.0* 25.3*  MCV 82.8 83.8 85.1 83.1 84.6  PLT 297 315 328 350 356     INR:  Recent Labs Lab 06/11/12 0500 06/12/12 0525 06/13/12 0500 06/14/12 0455 06/15/12 0545  INR 1.87* 1.89* 1.84* 1.79* 2.09*    Other results:  EKG: sinus tach  Imaging: Dg Chest 2 View  06/14/2012  *RADIOLOGY REPORT*  Clinical Data: Status post left ventricular assist device placement.  CHEST - 2 VIEW  Comparison: PA and lateral chest 06/11/2012.  Findings: Right PICC, AICD and left ventricular assist device are all unchanged.  There is cardiomegaly.  No pulmonary edema.  Mild atelectasis left lung base is noted.  IMPRESSION: No acute finding.   Original Report Authenticated By: Holley Dexter, M.D.      Medications:     Scheduled Medications: . allopurinol  300 mg Oral Daily  . amiodarone  400 mg Oral BID  . aspirin EC  325 mg Oral Daily   Or  . aspirin  324 mg Per Tube Daily   Or  . aspirin  300 mg Rectal Daily  . docusate sodium  200 mg Oral Daily  . ferrous fumarate-b12-vitamic C-folic acid  1 capsule Oral BID WC  . furosemide  40 mg Oral Daily  . lisinopril  40 mg Oral Daily  . pantoprazole  40 mg Oral Daily  . patient's guide to using coumadin book   Does not apply Once  . potassium chloride  40 mEq Oral Daily  . sodium chloride  3 mL Intravenous Q12H  . Warfarin - Pharmacist Dosing Inpatient   Does not apply q1800    Infusions:  none- received saline bolus for PI event  PRN Medications: sodium chloride, bisacodyl, HYDROcodone-acetaminophen, ondansetron (ZOFRAN) IV, oxyCODONE, sodium chloride, sodium chloride, traMADol, zolpidem   Assessment:   1. 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 H/o VT  3. NICM  4. Pneumothorax s/p R chest tube- resolved 5. Subtherapeutic INR- resolved 6. Abdominal pain- resolved 7. Protein Calorie Malnutrition- improving 8. Acute gout flareup R foot- resolved 9. Dark urine w/o change in LDH - LFT' normal-urine now clear 10. PI events associated with clinical dehydration and weaning off milrinone- now resolved  with liberalization of fluids Plan/Discussion:    POD # 13. Continue current care We'll progress with discharge teaching and dry line dressing changes with family. Aim for discharge postop day 14, remove PICC prior to DC CXR, repeat WBC in AM   I reviewed the LVAD parameters from today, and compared the results to the patient's prior recorded data.  No programming changes were made.  The LVAD is functioning within specified parameters.  The patient is performing the LVAD self-test daily.  LVAD interrogation was negative for any significant power changes, alarms or PI events/speed drops.  LVAD equipment check completed and is in good working order.  Back-up equipment present.   LVAD education done on emergency procedures  and precautions and reviewed exit site care.  Length of Stay: 16  VAN TRIGT III,PETER 06/15/2012, 9:53 AM

## 2012-06-15 NOTE — Progress Notes (Signed)
    VAD Teaching Note:   Patient along with Robert [name] (primary caregiver) have received VAD teaching and education and verbalize the understanding of the HM II system operation. Mrs. Robert [name] and [name] have both taken the education verification test and passed. Discharge date is set for tomorrow. The patient's primary caregiver Robert [name]) has been trained on the driveline management sterile dressing change and has performed the dressing under my supervision 2x. I have reviewed the daily flow sheet that the patient will record his VAD parameters along with his weights and driveline dressing status with patient and family.   Patient has had extensive education about:  1) Who to call for a VAD emergency.  2) What is considered a VAD emergency.  3) What back-up equipment needs to be with him at  all times.  4) How to take care of his driveline and equipment.  5) How to change his system controller.  6) The restrictions he has with having the pump.  7) How to change power sources.  8) What the critical and advisory alarms are for her VAD.  system.  9) The importance of Coumadin and following up for clinic appointments.  He reports he feels comfortable with his VAD system.    All questions have been answered and the patient and family feel comfortable for discharge tomorrow. All the equipment has been checked in and sent home with Robert [name] parents. Robert [name] practiced today switching out his system controller and feels comfortable with the steps.

## 2012-06-15 NOTE — Discharge Summary (Signed)
Advanced Heart Failure Team  Discharge Summary   Patient ID: Robert [name] MRN: [id number], DOB/AGE: October 13, 1955 57 y.o. Admit date: 05/30/2012 D/C date:     06/16/2012   Primary Discharge Diagnoses:  1. A/C systolic HF         -s/p HMII LVAD on 2/28    Secondary Discharge Diagnoses:  1. Ventricular tachycardia 2. Nonischemic cardiomyopathy  3. Pneumothorax s/p L chest tube  4. Hyponatremia/hypokalemia  5. Tremor relate to Reglan 6. 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 Cath 2011 showed normal coronaries.  He has been followed closely by the Advanced Heart Failure Clinic and was recently listed at [facility] for a heart transplant. Over the last couple of months the patient's condition has been declining and was placed on Milrinone late January/Febraury after a RHC at [facility]. He presented to our office on 2/24 with worsening heart failure symptoms (NYHA IV) and was admitted 05/30/12 for RHC. His symptoms continued to decline and he went into acute heart failure and IABP was placed 05/31/12. He underwent multiple medical tests and our Medical Review Board decided that he would be a great candidate for a BTT VAD.  On 06/02/12 he underwent placement of the HeartMate II LVAD and was admitted to the Surgical ICU with a speed of 9000 RPM. On POD 1 he had a L chest tube placement for large pneumothorax and was extubated later that day. He experienced an illeus during his stay and was started on reglan, which helped it improve, but he developed extrapyramidal symptoms so the reglan was discontinued. With the discontinuation of the reglan the tremor disappeared. On 06/10/11 a RAMP ECHO was performed and the patients speed was increased to 10,000 RPM demonstrating unloading of the L ventricle, the AV closed and the septum  midline.  On POD 10 the patient experienced dizziness getting up to go to the bathroom and sat down in a chair and then proceeded to fall to the ground. Upon reviewing the patient he experienced a run of VTach and then returned to NSR.  It was felt that the patient was dry and experienced a suction event and diuretics were held and a bolus was given. He was also started on PO amiodarone. During his hospital stay he experienced electrolyte imbalances (hyponatremia/hypokalemia), which were corrected and now within normal range. His MAPs were slightly elevated and we had to titrate his anti-hypertensives.  The patient and family have had extensive education on living with the LVAD and Robert [name] and his primary caregiver both passed the education verification test. The family and patient all feel safe and comfortable with discharge today. His LVAD speed is set on his primary and back-up controller at 10000 RPM with back-up speed at 9400.  LVAD:  FLOW: 5.3  SPEED: 9980   PI:5.2   POWER: 5.5    Physical Exam:  General: Sitting in the chair; NAD;  HEENT: normal  Neck: supple. JVP flat; Carotids 2+ bilat; no bruits. No lymphadenopathy or thryomegaly appreciated.  Cor: LVAD hum present  Lungs: clear,  Abdomen: soft, nontender,. + bowel sounds.Securement device intact and dressing C/D/I  Extremities: no cyanosis, clubbing, rash, edema  Neuro: alert & orientedx3, cranial nerves grossly intact. moves all 4 extremities w/o difficulty. No tremor  Discharge Weight Range: 205-210 Discharge Vitals: Blood  pressure 96/1, pulse 76, temperature 98.8 F (37.1 C), temperature source Oral, resp. rate 16, height 6\' 2"  (1.88 m), weight 205 lb 11 oz (93.3 kg), SpO2 100.00%.  Labs: Lab Results  Component Value Date   WBC 11.5* 06/16/2012   HGB 8.8* 06/16/2012   HCT 25.8* 06/16/2012   MCV 83.0 06/16/2012   PLT 398 06/16/2012    Recent Labs Lab 06/10/12 0500  06/16/12 0630  NA 133*  < > 136  K 3.7  < > 4.1  CL  95*  < > 101  CO2 29  < > 23  BUN 12  < > 11  CREATININE 0.97  < > 1.14  CALCIUM 8.6  < > 8.6  PROT 5.8*  --   --   BILITOT 1.0  --   --   ALKPHOS 77  --   --   ALT 25  --   --   AST 30  --   --   GLUCOSE 107*  < > 97  < > = values in this interval not displayed. Lab Results  Component Value Date   CHOL 206* 08/06/2011   HDL 50 08/06/2011   LDLCALC 161* 08/06/2011   TRIG 121 08/06/2011   BNP (last 3 results)  Recent Labs  08/05/11 0648 06/03/12 0115 06/09/12 0617  PROBNP 2992.0* 2692.0* 4703.0*    Diagnostic Studies/Procedures   Dg Chest 2 View  06/16/2012  *RADIOLOGY REPORT*  Clinical Data: History of previous placement of left ventricular assist device.  Follow-up.  CHEST - 2 VIEW  Comparison: 06/14/2012.  Findings: Dual lead AICD and left ventricular assist device are in place with AICD controller device on the left.  Tip of right PICC terminates in the lower portion of the superior vena cava near cavoatrial junction. No pneumothorax is evident.  Stable moderate cardiac silhouette enlargement.  Post median sternotomy.  No pulmonary edema or consolidation evident.  Minimal basilar atelectasis unchanged on left.  No definite pleural effusion.  IMPRESSION: Cardiac silhouette enlargement with no pulmonary edema or consolidation evident.  PICC in place.  No change in AICD / left ventricular assist device.  Minimal left basilar atelectasis unchanged.   Original Report Authenticated By: Onalee Hua Call     Discharge Medications     Medication List    STOP taking these medications       digoxin 0.125 MG tablet  Commonly known as:  LANOXIN     eplerenone 50 MG tablet  Commonly known as:  INSPRA     metoprolol succinate 25 MG 24 hr tablet  Commonly known as:  TOPROL-XL     sodium chloride 0.9 % SOLN with milrinone 1 MG/ML SOLN 200 mcg/mL     torsemide 20 MG tablet  Commonly known as:  DEMADEX      TAKE these medications       allopurinol 300 MG tablet  Commonly known as:   ZYLOPRIM  Take 1 tablet (300 mg total) by mouth daily.     amiodarone 400 MG tablet  Commonly known as:  PACERONE  Take 1 tablet (400 mg total) by mouth daily.     aspirin 81 MG chewable tablet  Chew 81 mg by mouth daily.     furosemide 40 MG tablet  Commonly known as:  LASIX  Take 1 tablet (40 mg total) by mouth daily.     lisinopril 40 MG tablet  Commonly known as:  PRINIVIL,ZESTRIL  Take 1 tablet (40 mg total) by mouth daily.  pantoprazole 40 MG tablet  Commonly known as:  PROTONIX  Take 1 tablet (40 mg total) by mouth daily.     patient's guide to using coumadin book Misc  1 each by Does not apply route once.     potassium chloride SA 20 MEQ tablet  Commonly known as:  K-DUR,KLOR-CON  Take 2 tablets (40 mEq total) by mouth daily.     traMADol 50 MG tablet  Commonly known as:  ULTRAM  Take 1-2 tablets (50-100 mg total) by mouth every 6 (six) hours as needed.     warfarin 5 MG tablet  Commonly known as:  COUMADIN  Take 7.5 mg (1 1/2 tablets) on Tues, Thurs, Sat and then 5 mg on Mon, Wed, Fri, and Sunday        Disposition   The patient will be discharged in stable condition to home. Discharge Orders   Future Appointments Provider Department Dept Phone   06/21/2012 11:30 AM [facility] [phone]   Future Orders Complete By Expires     (HEART FAILURE PATIENTS) Call MD:  Anytime you have any of the following symptoms: 1) 3 pound weight gain in 24 hours or 5 pounds in 1 week 2) shortness of breath, with or without a dry hacking cough 3) swelling in the hands, feet or stomach 4) if you have to sleep on extra pillows at night in order to breathe.  As directed     ACE Inhibitor / ARB already ordered  As directed     Call MD for:  extreme fatigue  As directed     Call MD for:  persistant dizziness or light-headedness  As directed     Call MD for:  redness, tenderness, or signs of infection (pain, swelling,  redness, odor or green/yellow discharge around incision site)  As directed     Call MD for:  severe uncontrolled pain  As directed     Call MD for:  temperature >100.4  As directed     Contraindication beta blocker-discharge  As directed     Diet - low sodium heart healthy  As directed     Discharge wound care:  As directed     Comments:      Driveline dressing change sterile daily    Driving Restrictions  As directed     Comments:      No driving until released from clinic    Heart Failure patients record your daily weight using the same scale at the same time of day  As directed     Increase activity slowly  As directed     Lifting restrictions  As directed     Comments:      Do not lift more than 5 lbs until released at clinic    PICC line removal  As directed     STOP any activity that causes chest pain, shortness of breath, dizziness, sweating, or exessive weakness  As directed       Follow-up Information   Follow up with Arvilla Meres, MD On 06/21/2012. (@ 11:30  Garage Code [id number])    Contact information:   [address] [place] [address] [phone]         Duration of Discharge Encounter: Greater than 35 minutes   Signed, [name]  06/16/2012, 9:14 AM  Patient seen and examined with Ulla Potash, NP. We discussed all aspects of the encounter. I agree  with the assessment and plan as stated above. I have edited the note to reflect my changes.  He is doing well and has received full VAD education. He is ready for d/c today as outlined above.  Daniel Bensimhon,MD 9:40 AM

## 2012-06-15 NOTE — Progress Notes (Signed)
CARDIAC REHAB PHASE I   PRE:  Rate/Rhythm: 91 SR  BP:  Supine:   Sitting: 94 Dpopplered  Standing:    SaO2: 96 RA  MODE:  Ambulation: 1240 ft   POST:  Rate/Rhythm: 117 ST  BP:  Supine:   Sitting: 94 Dopplered  Standing:    SaO2: 100 RA 1055-1130 Pt tolerated ambulation well without c/o. VS stable.Pt back to recliner after wak with call light in reach. Pt did self test on LVAD before walk.  Melina Copa RN 06/15/2012 11:29 AM

## 2012-06-15 NOTE — Progress Notes (Signed)
ANTICOAGULATION CONSULT NOTE - Follow Up Consult  Pharmacy Consult for Coumadin Indication: s/p LVAD  Pharmacy Consult for vancomycin  Indication: leukocytosis, chills  Labs:  Recent Labs  06/13/12 0500 06/14/12 0455 06/15/12 0545  HGB 8.9* 8.9* 8.6*  HCT 26.3* 26.0* 25.3*  PLT 328 350 356  LABPROT 20.6* 20.2* 22.6*  INR 1.84* 1.79* 2.09*  CREATININE 1.02 0.95 0.97   Lab Results  Component Value Date   INR 2.09* 06/15/2012   INR 1.79* 06/14/2012   INR 1.84* 06/13/2012    Assessment: 57 y/o male POD # 13 s/p LVAD on Coumadin. Amiodarone added 3/10. INR now at goal. D/w Elsie Ra NP and will stop lovenox bridge today and continue current dosing at 5mg  tonight.  Empiric abx coverage; no fevers noted past 24h, wbc stable at 13, culture/chills free orders to d/c vancomycin.  Goal of Therapy:  INR 2-3    Plan: Repeat Coumadin 5 mg today Daily INR's, CBC D/c Lovenox and Vancomycin   Sheppard Coil PharmD., BCPS Clinical Pharmacist Pager [phone] 06/15/2012 10:03 AM

## 2012-06-15 NOTE — Progress Notes (Signed)
HeartFailure/VAD 2 Rounding Note  Subjective:    Mr. 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 Ambulating well in the halls and worked on the stairs yesterday with no problems. MAPs 92-96. Weight up 3 lbs. WBC 13.3. Extensive education done yesterday with family and patient, will finish today.   INR: 2.09  LVAD INTERROGATION:  HeartMate II LVAD: Flow 5.6liters/min, speed 10K, power 6.8 , PI 4.9  Objective:    Vital Signs:   Temp:  [97.2 F (36.2 C)-97.8 F (36.6 C)] 97.2 F (36.2 C) (03/13 0610) Resp:  [16-18] 16 (03/13 0610) SpO2:  [100 %] 100 % (03/13 0610) Weight:  [207 lb 14.3 oz (94.3 kg)] 207 lb 14.3 oz (94.3 kg) (03/13 0455) Last BM Date: 06/13/12 Mean arterial Pressure 92-96  Intake/Output:   Intake/Output Summary (Last 24 hours) at 06/15/12 0713 Last data filed at 06/14/12 2000  Gross per 24 hour  Intake      0 ml  Output    300 ml  Net   -300 ml     Physical Exam: General: Sitting in the; NAD; son at bedside HEENT: normal Neck: supple. JVP flat; Carotids 2+ bilat; no bruits. No lymphadenopathy or thryomegaly appreciated.   Cor:  LVAD hum present  Lungs: clear,  Abdomen: soft, nontender,. + bowel sounds.Securement device intact and dressing C/D/I Extremities: no cyanosis, clubbing, rash, edema Neuro: alert & orientedx3, cranial nerves grossly intact. moves all 4 extremities w/o difficulty. No tremor Telemetry: SR 94 bpm  Labs: Basic Metabolic Panel:  Recent Labs Lab 06/11/12 0900 06/12/12 0525 06/13/12 0500 06/14/12 0455 06/15/12 0545  NA 136 132* 135 134* 136  K 3.6 3.3* 4.1 4.6 4.2  CL 95* 94* 98 102 105  CO2 30 30 27 24 22   GLUCOSE 127* 128* 102* 93 87  BUN 13 12 13 11 10   CREATININE 1.06 1.03 1.02 0.95 0.97  CALCIUM 8.6 8.4 8.4 8.5 8.3*    Liver Function Tests:  Recent Labs Lab 06/09/12 0617 06/10/12 0500  AST  21 30  ALT 16 25  ALKPHOS  --  77  BILITOT 1.2 1.0  PROT  --  5.8*  ALBUMIN  --  2.7*   No results found for this basename: LIPASE, AMYLASE,  in the last 168 hours No results found for this basename: AMMONIA,  in the last 168 hours  CBC:  Recent Labs Lab 06/11/12 0500 06/12/12 0525 06/13/12 0500 06/14/12 0455 06/15/12 0545  WBC 16.1* 14.5* 14.3* 14.0* 13.3*  HGB 9.7* 9.5* 8.9* 8.9* 8.6*  HCT 28.4* 28.0* 26.3* 26.0* 25.3*  MCV 82.8 83.8 85.1 83.1 84.6  PLT 297 315 328 350 356    INR:  Recent Labs Lab 06/11/12 0500 06/12/12 0525 06/13/12 0500 06/14/12 0455 06/15/12 0545  INR 1.87* 1.89* 1.84* 1.79* 2.09*    Other results:   Imaging: Dg Chest 2 View  06/14/2012  *RADIOLOGY REPORT*  Clinical Data: Status post left ventricular assist device placement.  CHEST - 2 VIEW  Comparison: PA and lateral chest 06/11/2012.  Findings: Right PICC, AICD and left ventricular assist device are all unchanged.  There is cardiomegaly.  No pulmonary edema.  Mild atelectasis left lung base is noted.  IMPRESSION: No acute finding.   Original Report Authenticated By: Holley Dexter, M.D.      Medications:     Scheduled Medications: .  allopurinol  300 mg Oral Daily  . amiodarone  400 mg Oral BID  . aspirin EC  325 mg Oral Daily   Or  . aspirin  324 mg Per Tube Daily   Or  . aspirin  300 mg Rectal Daily  . docusate sodium  200 mg Oral Daily  . ferrous fumarate-b12-vitamic C-folic acid  1 capsule Oral BID WC  . lisinopril  10 mg Oral BID  . pantoprazole  40 mg Oral Daily  . patient's guide to using coumadin book   Does not apply Once  . potassium chloride  40 mEq Oral Daily  . sodium chloride  3 mL Intravenous Q12H  . vancomycin  1,500 mg Intravenous Q8H  . Warfarin - Pharmacist Dosing Inpatient   Does not apply q1800    Infusions:    PRN Medications: sodium chloride, bisacodyl, HYDROcodone-acetaminophen, ondansetron (ZOFRAN) IV, oxyCODONE, sodium chloride, sodium  chloride, traMADol, zolpidem   Assessment:   1. 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 H/o VT  3. NICM  4. Pneumothorax s/p L chest tube 5. Hyponatremia/hypokalemia 6. Tremor  Plan/Discussion:    POD # 13  Doing great. WBC 13.3. No fevers/chills/ Cultures negative. Will stop vanc today and continue to monitor. MAPs still slightly elevated, will increase lisinopril to 40 daily. Unable to tolerate b-blocker. Weight up 3 lbs will re-start low dose diuretic 20 mg lasix. Goal for patient to go home tomorrow. Continue education  INR 2.09, will discontinue lovenox.   I reviewed the LVAD parameters from today, and compared the results to the patient's prior recorded data.  No programming changes were made.  The LVAD is functioning within specified parameters.  The patient is performing the LVAD self-test daily.  LVAD interrogation was negative for any significant power changes or alarms. 1 PI event. LVAD equipment check completed and is in good working order.  Back-up equipment present.   LVAD education done on emergency procedures and precautions and reviewed exit site care.  Length of Stay: [address]  [name], NP-C 06/15/2012 7:13 AM  Patient seen and examined with Ulla Potash, NP. We discussed all aspects of the encounter. I agree with the assessment and plan as stated above.   He looks great. Agree with stopping abx. Increase lisinopril. Continue ambulation and education. Can stop lovenox. Continue device speed at 10k. Probably home tomorrow.   Daniel Bensimhon,MD 8:05 AM

## 2012-06-15 NOTE — Progress Notes (Signed)
Occupational Therapy Treatment Patient Details Name: Robert [name] MRN: [id number] DOB: 11-08-1955 Today's Date: 06/15/2012 Time: 4098-1191 OT Time Calculation (min): 12 min  OT Assessment / Plan / Recommendation Comments on Treatment Session Pt progressing well with ADLs - currently he is supervision and should progress to modified independent level quickly.  Education completed.      Follow Up Recommendations  No OT follow up    Barriers to Discharge       Equipment Recommendations  3 in 1 bedside comode    Recommendations for Other Services    Frequency Min 3X/week   Plan Discharge plan needs to be updated    Precautions / Restrictions Precautions Precautions: Sternal;Fall Precaution Comments: LVAD Restrictions Weight Bearing Restrictions: No   Pertinent Vitals/Pain     ADL  Grooming: Wash/dry hands;Wash/dry face;Teeth care;Brushing hair;Supervision/safety;Simulated Where Assessed - Grooming: Supported standing Upper Body Bathing: Set up;Simulated Where Assessed - Upper Body Bathing: Unsupported sitting Lower Body Bathing: Simulated;Supervision/safety Where Assessed - Lower Body Bathing: Supported sit to stand Upper Body Dressing: Set up Where Assessed - Upper Body Dressing: Unsupported sitting Lower Body Dressing: Simulated;Supervision/safety Where Assessed - Lower Body Dressing: Supported sit to stand ADL Comments: Pt reports he bathed with set up only today, and got dressed - pt in pajama bottoms.  He reports no difficulty with ADLs, and no concerns re: activities at home.  Instructed him how to store LVAD back up bag in basket in walker.  Encouraged pt to resume ADLs at home.    OT Diagnosis:    OT Problem List:   OT Treatment Interventions:     OT Goals ADL Goals ADL Goal: Grooming - Progress: Met  Visit Information  Last OT Received On: 06/15/12 Assistance Needed: +1    Subjective Data      Prior Functioning       Cognition  Cognition Overall  Cognitive Status: Appears within functional limits for tasks assessed/performed Arousal/Alertness: Awake/alert Orientation Level: Appears intact for tasks assessed Behavior During Session: [facility] for tasks performed    Mobility  Bed Mobility Bed Mobility: Supine to Sit Supine to Sit: 7: Independent    Exercises      Balance     End of Session OT - End of Session Activity Tolerance: Patient tolerated treatment well Patient left: in bed;with call bell/phone within reach;with family/visitor present  GO     Conarpe, Wendi M 06/15/2012, 2:53 PM

## 2012-06-15 NOTE — Progress Notes (Signed)
Pt ambulated approx. 1000 feet with rolling walker. Pt tolerated very well. Pt is back in the bed with call bell in reach.

## 2012-06-16 ENCOUNTER — Inpatient Hospital Stay (HOSPITAL_COMMUNITY): Payer: BC Managed Care – PPO

## 2012-06-16 DIAGNOSIS — I5023 Acute on chronic systolic (congestive) heart failure: Secondary | ICD-10-CM

## 2012-06-16 DIAGNOSIS — Z95818 Presence of other cardiac implants and grafts: Secondary | ICD-10-CM

## 2012-06-16 DIAGNOSIS — I428 Other cardiomyopathies: Secondary | ICD-10-CM

## 2012-06-16 LAB — CBC
HCT: 25.8 % — ABNORMAL LOW (ref 39.0–52.0)
MCV: 83 fL (ref 78.0–100.0)
RDW: 14.7 % (ref 11.5–15.5)
WBC: 11.5 10*3/uL — ABNORMAL HIGH (ref 4.0–10.5)

## 2012-06-16 LAB — BASIC METABOLIC PANEL
BUN: 11 mg/dL (ref 6–23)
CO2: 23 mEq/L (ref 19–32)
Calcium: 8.6 mg/dL (ref 8.4–10.5)
Chloride: 101 mEq/L (ref 96–112)
Creatinine, Ser: 1.14 mg/dL (ref 0.50–1.35)
GFR calc Af Amer: 81 mL/min — ABNORMAL LOW (ref >90)
GFR calc non Af Amer: 70 mL/min — ABNORMAL LOW (ref >90)
Glucose, Bld: 97 mg/dL (ref 70–99)
Potassium: 4.1 mEq/L (ref 3.5–5.1)
Sodium: 136 mEq/L (ref 135–145)

## 2012-06-16 LAB — CULTURE, BLOOD (SINGLE): Culture: NO GROWTH

## 2012-06-16 MED ORDER — FUROSEMIDE 40 MG PO TABS: 40.0000 mg | ORAL_TABLET | Freq: Every day | ORAL | Status: DC

## 2012-06-16 MED ORDER — WARFARIN SODIUM 7.5 MG PO TABS: 7.5000 mg | ORAL_TABLET | Freq: Once | ORAL | Status: DC

## 2012-06-16 MED ORDER — AMIODARONE HCL 400 MG PO TABS: 400.0000 mg | ORAL_TABLET | Freq: Every day | ORAL | Status: DC

## 2012-06-16 MED ORDER — TRAMADOL HCL 50 MG PO TABS: 50.0000 mg | ORAL_TABLET | Freq: Four times a day (QID) | ORAL | Status: DC | PRN

## 2012-06-16 MED ORDER — POTASSIUM CHLORIDE CRYS ER 20 MEQ PO TBCR: 40.0000 meq | EXTENDED_RELEASE_TABLET | Freq: Every day | ORAL | Status: DC

## 2012-06-16 MED ORDER — PATIENT'S GUIDE TO USING COUMADIN BOOK: 1.0000 | Freq: Once | Status: DC

## 2012-06-16 MED ORDER — PANTOPRAZOLE SODIUM 40 MG PO TBEC: 40.0000 mg | DELAYED_RELEASE_TABLET | Freq: Every day | ORAL | Status: DC

## 2012-06-16 MED ORDER — ALLOPURINOL 300 MG PO TABS: 300.0000 mg | ORAL_TABLET | Freq: Every day | ORAL | Status: DC

## 2012-06-16 MED ORDER — WARFARIN SODIUM 5 MG PO TABS: ORAL_TABLET | ORAL | Status: DC

## 2012-06-16 MED ORDER — WARFARIN SODIUM 7.5 MG PO TABS
7.5000 mg | ORAL_TABLET | Freq: Once | ORAL | Status: AC
  Administered 2012-06-16: 7.5 mg via ORAL
  Filled 2012-06-16: qty 1

## 2012-06-16 MED ORDER — LISINOPRIL 40 MG PO TABS: 40.0000 mg | ORAL_TABLET | Freq: Every day | ORAL | Status: DC

## 2012-06-16 NOTE — Progress Notes (Signed)
HeartMate 2 Rounding Note  Subjective:    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 Cath 2011 showed normal coronaries. He also has a history of VTach.   He is followed very closely in Heart Failure Clinic and In December was referred to the Transplant Clinic at [facility] as his symptoms began to worsen, we were unable to titrate meds due to hypotension and frequent NSVT. He underwent transplant evaluation, RHC showed low output with high filling pressures. He was diuresed and placed on home inotropes. He is on the transplant list and was having worsening HF symptoms when he was admitted to the hospital for bridge VAD on 05/30/12/Prerop IABP placed for low cardiac output and CHF symptoms on inotropes.  LVAD POD #14 Recent episodes of PI events associated with V. tach and low flow reading on the system monitor associated with dehydration, increased beta blocker for blood pressure control. Fluids liberalized and beta blockers stopped. BP control with lisinopril and norvasc. Lasix resumed as weight is now slightly above baseline. Only one PI event overnight. No more VT after amiodarone started  The patient walked 1250 feet today with physical therapy-rehabilitation and is feeling significantly better. His white count is improved at 11,000. Cultures are negative. Surgical incisions are clean and dry. Off antibiotics   His INR is now slightly subtherapeutic and coumadin doce will be adjusted  Physical exam Patient is afebrile, mean arterial pressure 90-100 Lungs are clear to auscultation Surgical incisions are clean and dry No evidence of peripheral edema  .   INR 1.9   coumadin per PharmD  LVAD INTERROGATION:  HeartMate II LVAD:  Flow 5.6 liters/min, speed 10,000 power 5.4, PI 4.1  Objective:    Vital Signs:   Temp:  [98.8 F (37.1 C)] 98.8  F (37.1 C) (03/14 0552) SpO2:  [100 %] 100 % (03/14 0552) Weight:  [205 lb 11 oz (93.3 kg)] 205 lb 11 oz (93.3 kg) (03/14 0500) Last BM Date: 06/15/12 Mean arterial Pressure 92-96  Intake/Output:   Intake/Output Summary (Last 24 hours) at 06/16/12 0815 Last data filed at 06/15/12 1700  Gross per 24 hour  Intake    630 ml  Output    600 ml  Net     30 ml     Physical Exam:  Normal sinus rhythm, weight at baseline Surgical incisions dry and clean, lungs clear PICC line clean and dry Lungs clear No peripheral edema  General: Sitting on side of bed; No resp difficulty HEENT: normal Neck: supple. JVP 8-9 . Carotids 2+ bilat; no bruits. No lymphadenopathy or thryomegaly appreciated.; Cor: Mechanical heart sounds with LVAD hum present, with soft heart sounds Lungs: clear, diminshed in the bases; + pocket drain Abdomen: soft, tender, not distended. No hepatosplenomegaly. No bruits or masses. + bowel sounds. Securement device intact and dressing C/D/I Extremities: no cyanosis, clubbing, rash, edema, R UE 2L PICC Neuro: alert & orientedx3, cranial nerves grossly intact. moves all 4 extremities w/o difficulty. Affect pleasant  Telemetry: Sinus rhythm 88  Labs: Basic Metabolic Panel:  Recent Labs Lab 06/12/12 0525 06/13/12 0500 06/14/12 0455 06/15/12 0545 06/16/12 0630  NA 132* 135 134* 136 136  K 3.3* 4.1 4.6 4.2 4.1  CL 94* 98 102 105 101  CO2 30 27 24 22 23   GLUCOSE 128* 102* 93 87 97  BUN 12  13 11 10 11   CREATININE 1.03 1.02 0.95 0.97 1.14  CALCIUM 8.4 8.4 8.5 8.3* 8.6    Liver Function Tests:  Recent Labs Lab 06/10/12 0500  AST 30  ALT 25  ALKPHOS 77  BILITOT 1.0  PROT 5.8*  ALBUMIN 2.7*    CBC:  Recent Labs Lab 06/12/12 0525 06/13/12 0500 06/14/12 0455 06/15/12 0545 06/16/12 0630  WBC 14.5* 14.3* 14.0* 13.3* 11.5*  HGB 9.5* 8.9* 8.9* 8.6* 8.8*  HCT 28.0* 26.3* 26.0* 25.3* 25.8*  MCV 83.8 85.1 83.1 84.6 83.0  PLT 315 328 350 356 398     INR:  Recent Labs Lab 06/12/12 0525 06/13/12 0500 06/14/12 0455 06/15/12 0545 06/16/12 0630  INR 1.89* 1.84* 1.79* 2.09* 1.87*    Other results:  EKG: sinus tach  Imaging: No results found.   Medications:     Scheduled Medications: . allopurinol  300 mg Oral Daily  . amiodarone  400 mg Oral BID  . aspirin EC  325 mg Oral Daily   Or  . aspirin  324 mg Per Tube Daily   Or  . aspirin  300 mg Rectal Daily  . docusate sodium  200 mg Oral Daily  . ferrous fumarate-b12-vitamic C-folic acid  1 capsule Oral BID WC  . furosemide  40 mg Oral Daily  . lisinopril  40 mg Oral Daily  . pantoprazole  40 mg Oral Daily  . patient's guide to using coumadin book   Does not apply Once  . potassium chloride  40 mEq Oral Daily  . sodium chloride  3 mL Intravenous Q12H  . warfarin  7.5 mg Oral Once  . Warfarin - Pharmacist Dosing Inpatient   Does not apply q1800    Infusions:  none- received saline bolus for PI event  PRN Medications: sodium chloride, bisacodyl, HYDROcodone-acetaminophen, ondansetron (ZOFRAN) IV, oxyCODONE, sodium chloride, sodium chloride, traMADol, zolpidem   Assessment:   1. A/C systolic HF s/p HMII LVAD on 2/28 , preop IABP for cardiogenic shock 2. H/o VT  3. NICM  4. Pneumothorax postop VAD s/p R chest tube- resolved 5. Subtherapeutic INR- resolved 6. Abdominal pain- resolved 7. Protein Calorie Malnutrition- improving 8. Acute gout flareup R foot- resolved 9. Dark urine w/o change in LDH - LFT' normal-urine now clear 10. PI events associated with clinical dehydration and weaning off milrinone- now resolved with liberalization of fluids Plan/Discussion:    POD # 14.   Looks great and ready for DC home with f/u clinic visit in 5 days. Instructions given on activity limits, wound care, importance of daily weight and how to contact VAD team for any questions or problems at home.  I reviewed the LVAD parameters from today, and compared the results to  the patient's prior recorded data.  No programming changes were made.  The LVAD is functioning within specified parameters.  The patient is performing the LVAD self-test daily.  LVAD interrogation was negative for any significant power changes, alarms or PI events/speed drops.  LVAD equipment check completed and is in good working order.  Back-up equipment present.   LVAD education done on emergency procedures and precautions and reviewed exit site care.  Length of Stay: 17  VAN TRIGT III,PETER 06/16/2012, 8:15 AM

## 2012-06-16 NOTE — Progress Notes (Signed)
0930-1000 Cardiac Rehab Completed discharge education with pt. He voices understanding. Pt agrees to Outpt. CRP in [place], will send referral. Melina Copa RN

## 2012-06-16 NOTE — Progress Notes (Signed)
ANTICOAGULATION CONSULT NOTE - Follow Up Consult  Pharmacy Consult for Coumadin Indication: s/p LVAD  Labs:  Recent Labs  06/14/12 0455 06/15/12 0545 06/16/12 0630  HGB 8.9* 8.6* 8.8*  HCT 26.0* 25.3* 25.8*  PLT 350 356 398  LABPROT 20.2* 22.6* 20.8*  INR 1.79* 2.09* 1.87*  CREATININE 0.95 0.97 1.14   Lab Results  Component Value Date   INR 1.87* 06/16/2012   INR 2.09* 06/15/2012   INR 1.79* 06/14/2012    Assessment: 57 y/o male POD # 14 s/p LVAD on Coumadin. Amiodarone added 3/10. INR just below goal after being therapeutic x 2days. D/w Elsie Ra NP plan to d/c on 5mg  tabs taking 7.5mg  the next two days then a schedule 7.5mg  Tue/TR/Sat and 5mg  M/W/F/Sunday Goal of Therapy:  INR 2-3    Plan: Warfarin 7.5 mg today before d/c, repeat 7.5 tomorrow Discharge warfarin education done  Sheppard Coil PharmD., BCPS Clinical Pharmacist Pager [phone] 06/16/2012 8:13 AM

## 2012-06-16 NOTE — Progress Notes (Signed)
Discharge instructions given to pt. Robert [name] , NP has discussed pt's new medications and faxed his prescriptions. Pt is stable and ready for discharge with his family. Will place pt on his battery pack for discharge and ensure he has all his equipment. Will take hospitals LVAD equipment to 2300's director's office.

## 2012-06-16 NOTE — Progress Notes (Addendum)
PT Cancellation Note  Patient Details Name: Robert [name] MRN: [id number] DOB: April 22, 1955   Cancelled Treatment:    Reason Eval/Treat Not Completed: Other (comment) (Declined therapy secondary to d/c today. )  Pt met all PT goals and feels comfortable with all mobility.  Note pt decided he would get a rollator and PT agrees this would be helpful and this PT is glad he decided to get one for home use.  To d/c home today.    INGOLD,DAWN 06/16/2012, 9:24 AM [facility] [phone] (pager)

## 2012-06-18 NOTE — Clinical Social Work Placement (Deleted)
     Clinical Social Work Department CLINICAL SOCIAL WORK PLACEMENT NOTE 06/18/2012  Patient:  MYKAEL, BATZ  Account Number:  [id number] Admit date:  05/30/2012  Clinical Social Worker:  Lupita Leash Hallee Mckenny, LCSWA  Date/time:  06/18/2012 09:08 PM  Clinical Social Work is seeking post-discharge placement for this patient at the following level of care:   ASSISTED LIVING/REST HOME   (*CSW will update this form in Epic as items are completed)   06/18/2012  Patient/family provided with [facility] Department of Clinical Social Works list of facilities offering this level of care within the geographic area requested by the patient (or if unable, by the patients family).  06/18/2012  Patient/family informed of their freedom to choose among providers that offer the needed level of care, that participate in Medicare, Medicaid or managed care program needed by the patient, have an available bed and are willing to accept the patient.  06/18/2012  Patient/family informed of [facility] ownership interest in [facility], as well as of the fact that they are under no obligation to receive care at this facility.  PASARR submitted to EDS on 06/18/2012 PASARR number received from EDS on   FL2 transmitted to all facilities in geographic area requested by pt/family on   FL2 transmitted to all facilities within larger geographic area on   Patient informed that his/her managed care company has contracts with or will negotiate with  certain facilities, including the following:     Patient/family informed of bed offers received:   Patient chooses bed at  Physician recommends and patient chooses bed at    Patient to be transferred to  on   Patient to be transferred to facility by ambulance  Sharin Mons)  The following physician request were entered in Epic:   Additional Comments:

## 2012-06-19 LAB — CULTURE, BLOOD (ROUTINE X 2): Culture: NO GROWTH

## 2012-06-21 ENCOUNTER — Ambulatory Visit (HOSPITAL_COMMUNITY): Payer: Self-pay | Admitting: Anesthesiology

## 2012-06-21 ENCOUNTER — Ambulatory Visit (HOSPITAL_COMMUNITY)
Admit: 2012-06-21 | Discharge: 2012-06-21 | Disposition: A | Discharge status: Home / Self Care | Payer: BC Managed Care – PPO | Attending: Internal Medicine | Admitting: Internal Medicine

## 2012-06-21 VITALS — BP 96/1 | Wt 208.8 lb

## 2012-06-21 DIAGNOSIS — I5022 Chronic systolic (congestive) heart failure: Secondary | ICD-10-CM

## 2012-06-21 DIAGNOSIS — Z95818 Presence of other cardiac implants and grafts: Secondary | ICD-10-CM

## 2012-06-21 LAB — PROTIME-INR
INR: 2 — ABNORMAL HIGH (ref 0.00–1.49)
Prothrombin Time: 21.9 seconds — ABNORMAL HIGH (ref 11.6–15.2)

## 2012-06-21 LAB — CBC
HCT: 29 % — ABNORMAL LOW (ref 39.0–52.0)
Hemoglobin: 9.5 g/dL — ABNORMAL LOW (ref 13.0–17.0)
MCHC: 32.8 g/dL (ref 30.0–36.0)
RBC: 3.47 MIL/uL — ABNORMAL LOW (ref 4.22–5.81)

## 2012-06-21 LAB — BASIC METABOLIC PANEL
BUN: 12 mg/dL (ref 6–23)
CO2: 25 mEq/L (ref 19–32)
Chloride: 96 mEq/L (ref 96–112)
Glucose, Bld: 95 mg/dL (ref 70–99)
Potassium: 4 mEq/L (ref 3.5–5.1)
Sodium: 131 mEq/L — ABNORMAL LOW (ref 135–145)

## 2012-06-21 NOTE — Assessment & Plan Note (Signed)
Implanted 06/02/12. Minimal PI events however none-sustained. No alarms or power spikes. Continue to follow. Driveline dressing looks good, semi-incorporated with no drainage. Last suture removed today.

## 2012-06-21 NOTE — Assessment & Plan Note (Signed)
S/P LVAD 06/02/12 NYHA II-III symptoms. Volume status appears to be dry. Doing well at home and denies SOB, orthopnea or CP. Reports his appetite is increasing. MAP 96 slightly elevated will continue to monitor. Weight at home 202-205, believe his dry weight is closer to 208-210. Will hold lasix and potassium right now and continue to follow closely. Pry-syncopal episode when when he got home day of discharge, however no other events. Still experiencing slight chills/sweats at night however no fever. Will obtain labs today and follow up next week.

## 2012-06-21 NOTE — Patient Instructions (Signed)
Follow up in one week.  Do not take potassium or lasix right now. When weight hits 208 give me a call or if more short of breath.  Continue working on walking.  Will call with lab results.

## 2012-06-21 NOTE — Progress Notes (Signed)
Patient ID: Robert [name], male   DOB: 11/26/1955, 57 y.o.   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 Cath 2011 showed normal coronaries. He also has a history of VTach.   In December 2012 he was referred to [facility] for transplant evaluation and in early February had a repeat heart cath (@[facility] ) and was started on home milrinone. In mid February he presented with worsening HF symptoms and had was placed on IABP before a BTT VAD was placed on 05/30/12.  Follow Up: During his hospital stay he experienced an episode of Vtach and had a syncopal episode, which was related to over-diuresis. He was discharged on 06/16/12. On his ride home he experienced a diaphoretic episode and when he got home had a pre-syncopal episode and EMS was called. His diuretics were put on hold along with his potassium, however there was some confusion and he continued taking until Monday.   Reports feeling pretty good at home, but still have some chills/sweats at night. No fever. Denies any pain around driveline site and no drainage. Denies SOB,orthopnea or CP. Reports weight was 205 when discharged and now 200-202. Have held diuretics past few days. His power went out yesterday and patient reports he did fine and he switched to batteries and uninstalled his internal battery. His power came back on later that evening and he reinstalled his battery.   Denies LVAD alarms.  Denies driveline trauma, erythema or drainage.  Denies ICD shocks.   Reports taking Coumadin as prescribed and adherence to anticoagulation based dietary restrictions.  Denies bright red blood per rectum or melena, no dark urine or hematuria.    Past Medical History  Diagnosis Date  . AICD (automatic cardioverter/defibrillator) present   . Hypertension   . Congestive heart failure   .  Pneumonia   . Bronchitis     Current Outpatient Prescriptions  Medication Sig Dispense Refill  . allopurinol (ZYLOPRIM) 300 MG tablet Take 1 tablet (300 mg total) by mouth daily.  30 tablet  3  . amiodarone (PACERONE) 400 MG tablet Take 1 tablet (400 mg total) by mouth daily.  30 tablet  3  . aspirin 81 MG chewable tablet Chew 81 mg by mouth daily.      . furosemide (LASIX) 40 MG tablet Take 1 tablet (40 mg total) by mouth daily.      Marland Kitchen lisinopril (PRINIVIL,ZESTRIL) 40 MG tablet Take 1 tablet (40 mg total) by mouth daily.      . pantoprazole (PROTONIX) 40 MG tablet Take 1 tablet (40 mg total) by mouth daily.  30 tablet  3  . patient's guide to using coumadin book MISC 1 each by Does not apply route once.      . potassium chloride SA (K-DUR,KLOR-CON) 20 MEQ tablet Take 2 tablets (40 mEq total) by mouth daily.      . traMADol (ULTRAM) 50 MG tablet Take 1-2 tablets (50-100 mg total) by mouth every 6 (six) hours as needed.  45 tablet  1  . warfarin (COUMADIN) 5 MG tablet Take 7.5 mg (1 1/2 tablets) on Tues, Thurs, Sat and then 5 mg on Mon, Wed, Fri, and Sunday  45 tablet  3  . warfarin (COUMADIN) 7.5 MG tablet Take 1 tablet (7.5 mg total) by mouth once.  1 tablet  0  No current facility-administered medications for this encounter.    Review of patient's allergies indicates no known allergies.  REVIEW OF SYSTEMS: All systems negative except as listed in HPI, PMH and Problem list.   LVAD INTERROGATION:   HeartMate II LVAD:  Flow 5.4 liters/min, speed 10000, power 6.1, PI 5.0.  Back-up speed 9400.   I reviewed the LVAD parameters from today, and compared the results to the patient's prior recorded data.  No programming changes were made.  The LVAD is functioning within specified parameters.  The patient performs LVAD self-test daily.  LVAD interrogation was negative for any significant power changes, alarms or PI events/speed drops.  LVAD equipment check completed and is in good working  order.  Back-up equipment present.   LVAD education done on emergency procedures and precautions and reviewed exit site care.   Physical Exam: There were no vitals filed for this visit.  GENERAL: Well appearing, male who presents to clinic today in no acute distress. HEENT: normal  NECK: Supple, JVP flat .2+ bilaterally, no bruits.  No lymphadenopathy or thyromegaly appreciated.   CARDIAC:  Mechanical heart sounds with LVAD hum present.  LUNGS:  Clear to auscultation bilaterally.  ABDOMEN:  Soft, round, nontender, positive bowel sounds x4.     LVAD exit site: well-healed and semi-incorporated; last suture removed. Dressing dry and intact.  No erythema or drainage.  Stabilization device present and accurately applied.  Driveline dressing is being changed daily per sterile technique; changed in clinic EXTREMITIES:  Warm and dry, no cyanosis, clubbing, rash or edema  NEUROLOGIC:  Alert and oriented x 4.  Gait steady.  No aphasia.  No dysarthria.  Affect pleasant.     EKG: NSR 96 bpm  ASSESSMENT AND PLAN:

## 2012-06-22 NOTE — Addendum Note (Signed)
Encounter addended by: Merlene Pulling, CCT on: 06/22/2012  9:43 AM<BR>     Documentation filed: Charges VN

## 2012-06-27 DIAGNOSIS — Z48812 Encounter for surgical aftercare following surgery on the circulatory system: Secondary | ICD-10-CM

## 2012-06-28 ENCOUNTER — Encounter: Payer: Self-pay | Admitting: Internal Medicine

## 2012-06-28 NOTE — Consult Note (Signed)
I have reviewed and discussed the care of this patient in detail with the nurse practitioner including pertinent patient records, physical exam findings and data. I agree with details of this encounter.  

## 2012-06-29 ENCOUNTER — Ambulatory Visit (HOSPITAL_COMMUNITY): Payer: BC Managed Care – PPO

## 2012-06-29 ENCOUNTER — Ambulatory Visit (HOSPITAL_COMMUNITY): Payer: Self-pay | Admitting: Anesthesiology

## 2012-06-29 ENCOUNTER — Ambulatory Visit (HOSPITAL_COMMUNITY)
Admission: RE | Admit: 2012-06-29 | Discharge: 2012-06-29 | Disposition: A | Discharge status: Home / Self Care | Payer: BC Managed Care – PPO | Source: Ambulatory Visit | Attending: Internal Medicine | Admitting: Internal Medicine

## 2012-06-29 VITALS — BP 108/1 | HR 86 | Wt 215.0 lb

## 2012-06-29 DIAGNOSIS — I10 Essential (primary) hypertension: Secondary | ICD-10-CM | POA: Insufficient documentation

## 2012-06-29 DIAGNOSIS — I5023 Acute on chronic systolic (congestive) heart failure: Secondary | ICD-10-CM

## 2012-06-29 DIAGNOSIS — I509 Heart failure, unspecified: Secondary | ICD-10-CM | POA: Insufficient documentation

## 2012-06-29 DIAGNOSIS — I5022 Chronic systolic (congestive) heart failure: Secondary | ICD-10-CM

## 2012-06-29 DIAGNOSIS — Z95818 Presence of other cardiac implants and grafts: Secondary | ICD-10-CM

## 2012-06-29 DIAGNOSIS — Z95811 Presence of heart assist device: Secondary | ICD-10-CM

## 2012-06-29 DIAGNOSIS — Z95810 Presence of automatic (implantable) cardiac defibrillator: Secondary | ICD-10-CM | POA: Insufficient documentation

## 2012-06-29 LAB — CBC
MCH: 27.1 pg (ref 26.0–34.0)
MCHC: 32.7 g/dL (ref 30.0–36.0)
MCV: 83 fL (ref 78.0–100.0)
Platelets: 266 10*3/uL (ref 150–400)
RBC: 3.76 MIL/uL — ABNORMAL LOW (ref 4.22–5.81)
RDW: 15.4 % (ref 11.5–15.5)

## 2012-06-29 LAB — COMPREHENSIVE METABOLIC PANEL
AST: 14 U/L (ref 0–37)
Albumin: 3.3 g/dL — ABNORMAL LOW (ref 3.5–5.2)
Alkaline Phosphatase: 91 U/L (ref 39–117)
Calcium: 9 mg/dL (ref 8.4–10.5)
Creatinine, Ser: 1.06 mg/dL (ref 0.50–1.35)
GFR calc Af Amer: 89 mL/min — ABNORMAL LOW (ref >90)
GFR calc non Af Amer: 77 mL/min — ABNORMAL LOW (ref >90)
Total Bilirubin: 0.5 mg/dL (ref 0.3–1.2)
Total Protein: 7.2 g/dL (ref 6.0–8.3)

## 2012-06-29 MED ORDER — AMLODIPINE BESYLATE 10 MG PO TABS: 5.0000 mg | ORAL_TABLET | Freq: Every day | ORAL | Status: DC

## 2012-06-29 NOTE — Patient Instructions (Addendum)
Start Norvasc 5 mg (1/2 tablet) daily at bedtime.  Take 40 mg lasix and 20 meq Potassium on Mondays and Fridays. Do not take if weight less than 206 lbs.  Follow up 1 week.  Doing great and can start riding bicycle.

## 2012-06-29 NOTE — Progress Notes (Signed)
Patient ID: Robert [name], male   DOB: 25-Jan-1956, 57 y.o.   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 Cath 2011 showed normal coronaries. He also has a history of VTach.   In December 2012 he was referred to [facility] for transplant evaluation and in early February had a repeat heart cath (@[facility] ) and was started on home milrinone. In mid February he presented with worsening HF symptoms and had was placed on IABP before a BTT VAD was placed on 05/30/12.  Follow Up: Feeling good and feels like he is getting his strength back. Appetite improving. Has taken lasix one day for weight gain, but weight at home 204-208. Denies dizziness, chest pain, SOB, or orthopnea. Still having trouble sleeping through the night and getting some chills/sweats.   Denies LVAD alarms.  Denies driveline trauma, erythema or drainage.  Denies ICD shocks.   Reports taking Coumadin as prescribed and adherence to anticoagulation based dietary restrictions.  Denies bright red blood per rectum or melena, no dark urine or hematuria.    Past Medical History  Diagnosis Date  . AICD (automatic cardioverter/defibrillator) present   . Hypertension   . Congestive heart failure   . Pneumonia   . Bronchitis     Current Outpatient Prescriptions  Medication Sig Dispense Refill  . allopurinol (ZYLOPRIM) 300 MG tablet Take 1 tablet (300 mg total) by mouth daily.  30 tablet  3  . amiodarone (PACERONE) 400 MG tablet Take 1 tablet (400 mg total) by mouth daily.  30 tablet  3  . aspirin 81 MG chewable tablet Chew 81 mg by mouth daily.      . furosemide (LASIX) 40 MG tablet Take 40 mg by mouth as needed.      Marland Kitchen lisinopril (PRINIVIL,ZESTRIL) 40 MG tablet Take 1 tablet (40 mg total) by mouth daily.      . pantoprazole (PROTONIX) 40 MG tablet Take 1 tablet (40 mg total) by  mouth daily.  30 tablet  3  . potassium chloride SA (K-DUR,KLOR-CON) 20 MEQ tablet Take 20 mEq by mouth as needed.      . traMADol (ULTRAM) 50 MG tablet Take 1-2 tablets (50-100 mg total) by mouth every 6 (six) hours as needed.  45 tablet  1  . warfarin (COUMADIN) 5 MG tablet Take 7.5 mg (1 1/2 tablets) on Tues, Thurs, Sat and then 5 mg on Mon, Wed, Fri, and Sunday  45 tablet  3   No current facility-administered medications for this encounter.    Review of patient's allergies indicates no known allergies.  REVIEW OF SYSTEMS: All systems negative except as listed in HPI, PMH and Problem list.   LVAD INTERROGATION:   HeartMate II LVAD:  Flow 5.1 liters/min, speed 9990, power 6.2, PI 6.2.  Back-up speed 9400.   I reviewed the LVAD parameters from today, and compared the results to the patient's prior recorded data.  No programming changes were made.  The LVAD is functioning within specified parameters.  The patient performs LVAD self-test daily.  LVAD interrogation was negative for any significant power changes, alarms or PI events/speed drops.  LVAD equipment check completed and is in good working order.  Back-up equipment present.   LVAD education done on emergency procedures and precautions and reviewed exit site care.   Physical Exam: Filed Vitals:  06/29/12 1127  BP: 108/1  Pulse: 86  Weight: 215 lb (97.523 kg)  SpO2: 100%  Last weight: 208  GENERAL: Well appearing, male who presents to clinic today in no acute distress. HEENT: normal  NECK: Supple, JVP flat .2+ bilaterally, no bruits.  No lymphadenopathy or thyromegaly appreciated.   CARDIAC:  LVAD hum present.  LUNGS:  Clear to auscultation bilaterally.  ABDOMEN:  Soft, round, nontender, positive bowel sounds x4.     LVAD exit site: well-healed and semi-incorporated; Dressing dry and intact.  No erythema or drainage.  Stabilization device present and accurately applied.  Driveline dressing is being changed daily per sterile  technique; changed in clinic EXTREMITIES:  Warm and dry, no cyanosis, clubbing, rash or edema  NEUROLOGIC:  Alert and oriented x 4.  Gait steady.  No aphasia.  No dysarthria.  Affect pleasant.     EKG: NSR 89 bpm  ASSESSMENT AND PLAN:

## 2012-06-29 NOTE — Addendum Note (Signed)
Encounter addended by: [name], NP on: 06/29/2012 12:41 PM<BR>     Documentation filed: Charges VN, Follow-up Section, LOS Section, Problem List

## 2012-06-29 NOTE — Addendum Note (Signed)
Encounter addended by: [name], NP on: 06/29/2012  3:28 PM<BR>     Documentation filed: Orders

## 2012-06-29 NOTE — Assessment & Plan Note (Signed)
Few PI events, non-sustained. Doing great and drivline dressing looks great with no signs of infection.

## 2012-06-29 NOTE — Assessment & Plan Note (Signed)
NYHA II symptoms. Volume status good. He is doing well at home and is trying to increase his activity slowly. I told him it was ok if he started riding his bicycle at home. Denies SOB/orthopnea and CP. MAP slightly elevated will start Norvasc 5 mg daily. Will have patient start taking 40 mg lasix and 20 meq K+ on Mondays and Fridays unless weight less than 206. Will get labs CMET, CBC, LDH and INR today. Next visit 2 weeks and hopefully can allow him to start driving.

## 2012-06-29 NOTE — Assessment & Plan Note (Signed)
As above, MAP slightly elevated 108 will start 5 mg norvasc daily.

## 2012-06-30 NOTE — Addendum Note (Signed)
Encounter addended by: Sanda Linger, CCT on: 06/30/2012  9:46 AM<BR>     Documentation filed: Charges VN

## 2012-07-12 ENCOUNTER — Encounter: Payer: Self-pay | Admitting: Internal Medicine

## 2012-07-12 ENCOUNTER — Ambulatory Visit (HOSPITAL_COMMUNITY)
Admission: RE | Admit: 2012-07-12 | Discharge: 2012-07-12 | Disposition: A | Discharge status: Home / Self Care | Payer: BC Managed Care – PPO | Source: Ambulatory Visit | Attending: Internal Medicine | Admitting: Internal Medicine

## 2012-07-12 VITALS — BP 120/1 | HR 98 | Wt 214.5 lb

## 2012-07-12 DIAGNOSIS — I5022 Chronic systolic (congestive) heart failure: Secondary | ICD-10-CM | POA: Insufficient documentation

## 2012-07-12 DIAGNOSIS — Z95818 Presence of other cardiac implants and grafts: Secondary | ICD-10-CM

## 2012-07-12 DIAGNOSIS — Z95811 Presence of heart assist device: Secondary | ICD-10-CM

## 2012-07-12 DIAGNOSIS — Z95810 Presence of automatic (implantable) cardiac defibrillator: Secondary | ICD-10-CM

## 2012-07-12 DIAGNOSIS — I10 Essential (primary) hypertension: Secondary | ICD-10-CM

## 2012-07-12 LAB — BASIC METABOLIC PANEL
Calcium: 9.2 mg/dL (ref 8.4–10.5)
GFR calc non Af Amer: 78 mL/min — ABNORMAL LOW (ref >90)
Glucose, Bld: 91 mg/dL (ref 70–99)
Sodium: 138 mEq/L (ref 135–145)

## 2012-07-12 LAB — CBC
Hemoglobin: 11.4 g/dL — ABNORMAL LOW (ref 13.0–17.0)
MCH: 26.9 pg (ref 26.0–34.0)
MCHC: 32.6 g/dL (ref 30.0–36.0)
RDW: 15.8 % — ABNORMAL HIGH (ref 11.5–15.5)

## 2012-07-12 LAB — PROTIME-INR: Prothrombin Time: 25.7 seconds — ABNORMAL HIGH (ref 11.6–15.2)

## 2012-07-12 MED ORDER — AMLODIPINE BESYLATE 10 MG PO TABS: 10.0000 mg | ORAL_TABLET | Freq: Every day | ORAL | Status: DC

## 2012-07-12 NOTE — Progress Notes (Signed)
Patient ID: SUDEEP SCHEIBEL, male   DOB: 02-Jul-1955, 57 y.o.   MRN: [id number] HPI: Mr. 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 Cath 2011 showed normal coronaries. He also has a history of VTach.   In December 2012 he was referred to [facility] for transplant evaluation and in early February had a repeat heart cath (@[facility] ) and was started on home milrinone. In mid February he presented with worsening HF symptoms and had was placed on IABP before a BTT VAD was placed on 05/30/12.  Follow Up:  Patient reports feeling pretty well, however yesterday he started feeling run down. He reports that he thinks that he was having some flutter in his heart yesterday. Riding his stationary bicycle 20 min 2 x a day with no SOB. Denies CP/ dizziness, or edema. Weight stable at home along with taking medications as prescribed.  Denies LVAD alarms.  Denies driveline trauma, erythema or drainage.  Denies ICD shocks.   Reports taking Coumadin as prescribed and adherence to anticoagulation based dietary restrictions.  Denies bright red blood per rectum or melena, no dark urine or hematuria.    Past Medical History  Diagnosis Date  . AICD (automatic cardioverter/defibrillator) present   . Hypertension   . Congestive heart failure   . Pneumonia   . Bronchitis     Current Outpatient Prescriptions  Medication Sig Dispense Refill  . allopurinol (ZYLOPRIM) 300 MG tablet Take 1 tablet (300 mg total) by mouth daily.  30 tablet  3  . amiodarone (PACERONE) 400 MG tablet Take 1 tablet (400 mg total) by mouth daily.  30 tablet  3  . amLODipine (NORVASC) 10 MG tablet Take 1 tablet (10 mg total) by mouth daily.  30 tablet  3  . aspirin 81 MG chewable tablet Chew 81 mg by mouth daily.      . furosemide (LASIX) 40 MG tablet Take 40 mg by mouth as needed.      Marland Kitchen lisinopril  (PRINIVIL,ZESTRIL) 40 MG tablet Take 1 tablet (40 mg total) by mouth daily.      . pantoprazole (PROTONIX) 40 MG tablet Take 1 tablet (40 mg total) by mouth daily.  30 tablet  3  . potassium chloride SA (K-DUR,KLOR-CON) 20 MEQ tablet Take 20 mEq by mouth as needed.      . traMADol (ULTRAM) 50 MG tablet Take 1-2 tablets (50-100 mg total) by mouth every 6 (six) hours as needed.  45 tablet  1  . warfarin (COUMADIN) 5 MG tablet Take 7.5 mg (1 1/2 tablets) on Tues, Thurs, Sat and then 5 mg on Mon, Wed, Fri, and Sunday  45 tablet  3   No current facility-administered medications for this encounter.    Review of patient's allergies indicates no known allergies.  REVIEW OF SYSTEMS: All systems negative except as listed in HPI, PMH and Problem list.   LVAD INTERROGATION:   HeartMate II LVAD:  Flow 6.6 liters/min, speed 9990, power 6.3, PI 7.0.  Back-up speed 9400.   I reviewed the LVAD parameters from today, and compared the results to the patient's prior recorded data.  No programming changes were made.  The LVAD is functioning within specified parameters.  The patient performs LVAD self-test daily.  LVAD interrogation was negative for any significant power changes, alarms or PI events/speed drops.  LVAD equipment check completed  and is in good working order.  Back-up equipment present.   LVAD education done on emergency procedures and precautions and reviewed exit site care.   Physical Exam: Filed Vitals:   07/12/12 1049  BP: 120/1  Pulse: 98  Weight: 214 lb 8 oz (97.297 kg)  SpO2: 98%    GENERAL: Well appearing, male who presents to clinic today in no acute distress. HEENT: normal  NECK: Supple, JVP flat .2+ bilaterally, no bruits.  No lymphadenopathy or thyromegaly appreciated.   CARDIAC:  LVAD hum present.  LUNGS:  Clear to auscultation bilaterally.  ABDOMEN:  Soft, round, nontender, positive bowel sounds x4.     LVAD exit site: well-healed and semi-incorporated; Dressing dry and  intact.  No erythema or drainage.  Stabilization device present and accurately applied.  Driveline dressing is being changed daily per sterile technique; changed in clinic EXTREMITIES:  Warm and dry, no cyanosis, clubbing, rash or edema  NEUROLOGIC:  Alert and oriented x 4.  Gait steady.  No aphasia.  No dysarthria.  Affect pleasant.     EKG: NSR 77 bpm  ASSESSMENT AND PLAN:

## 2012-07-12 NOTE — Assessment & Plan Note (Addendum)
NYHA II on VAD support. Doing well and starting to increase his activity. Riding stationary bicycle 20 min 2x daily. Will contact CR to see if we can get the patient enrolled. Volume status looks good and patient re-instructed to not take lasix if weight less that 206 lbs at home. Labs stable and INR therapeutic. Follow up in 2 weeks.  Re-educated patient on how to switch controller out if it was an emergency and he practiced on mock loop and back up controller.  Patient seen and examined with Ulla Potash, NP. We discussed all aspects of the encounter. I agree with the assessment and plan as stated above. Doing great with VAD. Continue activity. Reinforced need for daily weights and reviewed use of sliding scale diuretics.

## 2012-07-12 NOTE — Patient Instructions (Signed)
Increase norvasc to 10 mg daily.  Call any issues.  Follow up 2 weeks.   Will call with lab results.

## 2012-07-12 NOTE — Assessment & Plan Note (Addendum)
MAP remains elevated 120. Will increase norvasc to 10 mg daily and continue to monitor.   Attending: Agree.

## 2012-07-12 NOTE — Assessment & Plan Note (Addendum)
Patient reporting symptoms of his heart fluttering. ICD interrogated and last event of NSVT a couple seconds was 07/04/12, none the past few days when patient was feeling poorly.   Attending: ICD interrogated personally with Medtr[employer] rep. Results reviewed. 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

## 2012-07-17 ENCOUNTER — Ambulatory Visit (HOSPITAL_COMMUNITY): Payer: Self-pay | Admitting: Anesthesiology

## 2012-07-17 ENCOUNTER — Telehealth (HOSPITAL_COMMUNITY): Payer: Self-pay | Admitting: Anesthesiology

## 2012-07-17 MED ORDER — AMOXICILLIN 500 MG PO CAPS: ORAL_CAPSULE | ORAL | Status: DC

## 2012-07-17 NOTE — Telephone Encounter (Signed)
Per Ulla Potash, NP pt needs amox prior to dental work, pt aware and rx sent in

## 2012-07-17 NOTE — Patient Instructions (Signed)
Refer to anticoag flowsheet.

## 2012-07-18 NOTE — Assessment & Plan Note (Signed)
Attending: Device interrogated personally at bedside. No changes made.

## 2012-07-26 ENCOUNTER — Ambulatory Visit (HOSPITAL_COMMUNITY): Payer: Self-pay | Admitting: Anesthesiology

## 2012-07-26 ENCOUNTER — Ambulatory Visit (HOSPITAL_COMMUNITY)
Admission: RE | Admit: 2012-07-26 | Discharge: 2012-07-26 | Disposition: A | Discharge status: Home / Self Care | Payer: BC Managed Care – PPO | Source: Ambulatory Visit | Attending: Internal Medicine | Admitting: Internal Medicine

## 2012-07-26 VITALS — BP 98/1 | HR 78 | Wt 218.8 lb

## 2012-07-26 DIAGNOSIS — R0609 Other forms of dyspnea: Secondary | ICD-10-CM

## 2012-07-26 DIAGNOSIS — I509 Heart failure, unspecified: Secondary | ICD-10-CM | POA: Insufficient documentation

## 2012-07-26 DIAGNOSIS — I472 Ventricular tachycardia, unspecified: Secondary | ICD-10-CM

## 2012-07-26 DIAGNOSIS — I10 Essential (primary) hypertension: Secondary | ICD-10-CM | POA: Insufficient documentation

## 2012-07-26 DIAGNOSIS — R0600 Dyspnea, unspecified: Secondary | ICD-10-CM

## 2012-07-26 DIAGNOSIS — Z95811 Presence of heart assist device: Secondary | ICD-10-CM

## 2012-07-26 DIAGNOSIS — Z95818 Presence of other cardiac implants and grafts: Secondary | ICD-10-CM

## 2012-07-26 DIAGNOSIS — I5022 Chronic systolic (congestive) heart failure: Secondary | ICD-10-CM

## 2012-07-26 LAB — COMPREHENSIVE METABOLIC PANEL
BUN: 11 mg/dL (ref 6–23)
CO2: 27 mEq/L (ref 19–32)
Chloride: 102 mEq/L (ref 96–112)
Creatinine, Ser: 0.98 mg/dL (ref 0.50–1.35)
GFR calc non Af Amer: >90 mL/min (ref >90)
Total Bilirubin: 0.6 mg/dL (ref 0.3–1.2)

## 2012-07-26 LAB — PRO B NATRIURETIC PEPTIDE: Pro B Natriuretic peptide (BNP): 1789 pg/mL — ABNORMAL HIGH (ref 0–125)

## 2012-07-26 LAB — CBC
MCHC: 33.4 g/dL (ref 30.0–36.0)
RDW: 15.9 % — ABNORMAL HIGH (ref 11.5–15.5)

## 2012-07-26 LAB — LACTATE DEHYDROGENASE: LDH: 233 U/L (ref 94–250)

## 2012-07-26 LAB — PROTIME-INR
INR: 2.26 — ABNORMAL HIGH (ref 0.00–1.49)
Prothrombin Time: 24 seconds — ABNORMAL HIGH (ref 11.6–15.2)

## 2012-07-26 NOTE — Patient Instructions (Addendum)
Take lasix if weight at home greater than 210 lbs.  Call any issues.  Follow up 1 month.  Will call with lab results.

## 2012-07-26 NOTE — Progress Notes (Signed)
6 min walk test completed, pt ambulated 1300 feet without complaints

## 2012-07-26 NOTE — Assessment & Plan Note (Addendum)
NYHA II. Volume status good. Weight up 4 lbs, but appears to not be fluid. MAP slightly elevated still, consider adding hydralazine and nitrates next visit if still up. Riding stationary bicycle and reports minimal SOB. CR still has not contacted patient about follow up, will call back. Changed lasix PRN to if weight greater than 210 lbs, or if he notices any swelling or SOB. Pending labs CBC, INR, LDH, CMET and Pro-BNP.   Reinforced daily weights and recording his LVAD numbers. Can switch to every other day on dressing changes.

## 2012-07-26 NOTE — Patient Instructions (Signed)
Refer to anticoag flowsheet.

## 2012-07-26 NOTE — Progress Notes (Signed)
Patient ID: Robert [name], male   DOB: 11/23/1955, 57 y.o.   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 Cath 2011 showed normal coronaries. He also has a history of VTach.   In December 2012 he was referred to [facility] for transplant evaluation and in early February had a repeat heart cath (@[facility] ) and was started on home milrinone. In mid February he presented with worsening HF symptoms and had was placed on IABP before a BTT VAD was placed on 05/30/12.  Follow Up:  Reports feeling well, however he has a head cold and hay fever right now. Riding about 15 min on bicycle with minimal SOB. Last visit cut lasix back to PRN except for weight greater than 206 at home. Weight at home 206-209. Has not taken any lasix. Denies orthopnea, CP, or edema. Taking medications as prescribed.   Denies LVAD alarms.  Denies driveline trauma, erythema or drainage.  Denies ICD shocks.   Reports taking Coumadin as prescribed and adherence to anticoagulation based dietary restrictions.  Denies bright red blood per rectum or melena, no dark urine or hematuria.    Past Medical History  Diagnosis Date  . AICD (automatic cardioverter/defibrillator) present   . Hypertension   . Congestive heart failure   . Pneumonia   . Bronchitis     Current Outpatient Prescriptions  Medication Sig Dispense Refill  . allopurinol (ZYLOPRIM) 300 MG tablet Take 1 tablet (300 mg total) by mouth daily.  30 tablet  3  . amiodarone (PACERONE) 400 MG tablet Take 1 tablet (400 mg total) by mouth daily.  30 tablet  3  . amLODipine (NORVASC) 10 MG tablet Take 1 tablet (10 mg total) by mouth daily.  30 tablet  3  . amoxicillin (AMOXIL) 500 MG capsule Take 4 tabs 1 hour prior to dental work  4 capsule  3  . aspirin 81 MG chewable tablet Chew 81 mg by mouth daily.      .  furosemide (LASIX) 40 MG tablet Take 40 mg by mouth as needed.      Marland Kitchen lisinopril (PRINIVIL,ZESTRIL) 40 MG tablet Take 1 tablet (40 mg total) by mouth daily.      . pantoprazole (PROTONIX) 40 MG tablet Take 1 tablet (40 mg total) by mouth daily.  30 tablet  3  . potassium chloride SA (K-DUR,KLOR-CON) 20 MEQ tablet Take 20 mEq by mouth as needed.      . warfarin (COUMADIN) 5 MG tablet Take 7.5 mg (1 1/2 tablets) on Tues, Thurs, Sat and then 5 mg on Mon, Wed, Fri, and Sunday  45 tablet  3   No current facility-administered medications for this encounter.    Review of patient's allergies indicates no known allergies.  REVIEW OF SYSTEMS: All systems negative except as listed in HPI, PMH and Problem list.   LVAD INTERROGATION:   HeartMate II LVAD:  Flow 6.0 liters/min, speed 9990, power 7.1, PI 6.1.  Back-up speed 9400.   I reviewed the LVAD parameters from today, and compared the results to the patient's prior recorded data.  No programming changes were made.  The LVAD is functioning within specified parameters.  The patient performs LVAD self-test daily.  LVAD interrogation was negative for any significant power changes, alarms or PI events/speed drops.  LVAD equipment check completed and is in  good working order.  Back-up equipment present.   LVAD education done on emergency procedures and precautions and reviewed exit site care.   Physical Exam: Filed Vitals:   07/26/12 0955  BP: 98/1  Pulse: 78  Weight: 218 lb 12 oz (99.224 kg)  SpO2: 100%  Last weight: 214  GENERAL: Well appearing, male who presents to clinic today in no acute distress. HEENT: normal  NECK: Supple, JVP flat .2+ bilaterally, no bruits.  No lymphadenopathy or thyromegaly appreciated.   CARDIAC:  LVAD hum present.  LUNGS:  Clear to auscultation bilaterally.  ABDOMEN:  Soft, round, nontender, positive bowel sounds x4.     LVAD exit site: well-healed and semi-incorporated; Dressing dry and intact.  No erythema or  drainage.  Stabilization device present and accurately applied.  Driveline dressing is being changed daily per sterile technique; changed in clinic EXTREMITIES:  Warm and dry, no cyanosis, clubbing, rash or edema  NEUROLOGIC:  Alert and oriented x 4.  Gait steady.  No aphasia.  No dysarthria.  Affect pleasant.       ASSESSMENT AND PLAN:

## 2012-07-26 NOTE — Assessment & Plan Note (Signed)
Denies any shocks, still on Amiodarone at 400 mg daily consider cutting it back to 200 mg daily next visit.

## 2012-08-02 ENCOUNTER — Encounter: Payer: Self-pay | Admitting: Internal Medicine

## 2012-08-02 NOTE — Clinical Social Work Psychosocial (Addendum)
[place] Department of Social Work LVAD (Left Ventricular Assist Device) Psychosocial Screening  Please remember that all information is confidential within the members of the VAD team and [place] [facility]   Date: 2/26/201 Time: 13:00 Referral Source: Dr. Gala Romney, Ulla Potash, Dr. Morton Peters Referral Reason: LVAD Placement Source of Information: Patient, Mother, Father, Sister, Brother,  patient chart, HF Team  Demographics  Name:  Robert [name]    DOB: [birth year] SS#: [id number] Address: [address]  [place]: [place]: [place]   [place] [address] Phone Number: [phone] Martial Status: Single Faith: Holiness Primary Language: English  Adherence with Medical Regimen: Yes Medication adherence:  Yes Physician appointment attendance: Yes *Do you have a Living Will or Medical POA?  No not at this time.  Would you like to complete a Living Will and Medical POA prior to surgery?  No. Not at this ttime  Are you currently a DNR?  No Do you have a MOST form? No Would you like to review one? No Do you have goals of care? Yes Have you had a consult with the palliative care team at [facility]?  Yes   Psychological Health  Appearance:  Sitting up in bed- appears well groomed and clean.  Currently in a hospital gown. Mental status: Alert and oriented all spheres.  Appeared relaxed and calm Eye Contact: Excellent throughout entire interview Thought Content:  Clear, coherent and logical Speech: Normal tone and volume, appropriate Mood: Jovial. Patient smiles a great deal and has an extremely positive outlook.   Affect: Appropriate for the circumstances Insight:Excellent Judgment: Excellent Interaction Style: Patient was open and accepting to CSW and presented a very light and happy affect.  He continued to smile and welcomed CSW for the interview.  He was also open and friendly while his family was present.  Family/Social Information  Patient lives alone Who lives in  your home?  Name    Age   Relationship to You  Do you have a plan for child care if relevant?  NA  List family members outside the home (parents, friends, pastor, etc..)  Name    Age   Relationship to You  Robert [name]      [address]   Mother  Robert [name] 27   Father  Robert [name]   59   Sister  Please list people who give you emotional support (family, parents, friends, pastor, etc..)  Name    Age   Relationship to You Parents  (listed above) Sister   (Listed above) Robert [name]               35                Son Robert [name]   [address]   *Who is your primary and backup support pre and post-surgery? Explain the relationships i.e. strengths/weakness, etc.: Sister Robert [name] is identified at primary. Patient describes her as being very smart and would be able to handle the dressing changes. During the interview however, patient's sister appeared to be somewhat distant and did not give the impression that she wanted to take on these responsibilities. CSW spoke with her multiple time and she continued to agree to accept the role of primary caregiver.    Secondary Caregiver identified: his parents and eldest son are secondary.  Patient's parents are very quiet, reserved but friendly.  They appear to be very concerned about their son and want what is best for  him.  CSW is somewhat concerned that they will be able to manage secondary care giving due to their age.  They did appear to have a generally good understanding of patient's condition and medical issues.      Living situation: patient lives alone in [place].   Travel distance to [facility]:  Approximately 10 minutes from the hospital  Second Hand Smoke Exposure: None.  Never smoked.  Self- Care level: Good. He has been fully independent prior to this hospitalization . Ambulation: Good. Independent  Transportation:  Has his own car;  Can rely on rides from any of his family if needed. All adults concerned have  a car. Limitations: None Noted Barriers impacting ability to participate in care: None Noted.  Community  Are you active with community agencies/resources? No Are you active in a church, synagogue, mosque, or other faith based community? Yes- active in his church and reads his Bible at home What other sources do you have for spiritual support? My church, my Bible  Are you active in any clubs or social organizations?  No- I was once very involved in different things but as my health deteriorated over the years, I became less and less active.   What do you do for fun? hobbies, interests? No hobbies now but past interests included [employer] of cars and motorcycles.  "I love anything with a motor in it and used to be quite a dare devil." I do still enjoy watching basketball on TV (some) but not as much as I used to.  I once played a lot of ball."   Education/Work information  What is the last grade of school you completed? 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 (Written, verbal, hands on)-  "Show me"- I'd rather learn by doing  (hands on) Do you have any problems with reading or writing?None Are you currently employed? No If no, when were you last employed? He had to stop working about  3 weeks ago. In 2012 he was working 3 different jobs. 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  Over 20 years If you are not currently working, do you plan to return to work after VAD surgery? "Yes. I hope to be able to return to work eventually. I enjoyed my job(s) and I enjoy being active and working." If yes, what type of employment do you hope to find?  " I hope to return to my same job."  Are you interested in job training or learning new skills? "I would be open to this- I always enjoy learning new things but I've done a lot of  different kinds of jobs in my lifetime." Did you serve in the Military?  If so, what branch?  No  Financial Information  What is your source of income? None at this time.  He is hoping to sale some real estate Do you have difficulty meeting your monthly expenses? " Yes-- now I am but this is recent" If yes, which ones? Rent, Utilities  How do you usually cope with this? My family will be  helping me some but I worked until 3 weeks ago so I had been able to afford my bills.  Primary Health Insurance: BCBS  (found a policy for $14.00 per month) through the Affordable Care Act  Website  Secondary Insurance: None at this time.  Have you applied for Medicaid? Yes and has been denied.  He is still working on this.  Have you applied for Social Security Disability? Yes but been denied 4 times.  He was counseled by CSW that he should consider hiring a disability attorney to assist with this process.   *Do you have prescription coverage?  Yes What are your prescription co-pays? Unsure at this time as he has not used this new policy *Are you required to use a certain pharmacy?  No Do you have a mail order option for your prescriptions? No If yes, what pharmacy do you use for mail order? NA *Have you ever refused medication due to cost? Yes- there are some meds that I've just not been able to afford Discuss monthly cost for dressing supplies post procedure ~ $150-300  Discussed and he is concerned about these possible monthly costs.  He plans to reapply for Medicaid as he will have ongoing medical expenses.  The HF team will also need to evaluate this and assist patient as needed.    Can you budget for this monthly expense?  "Unsure but I am trying to sell some property (a 2nd house) to help pay for my medical costs."  Patient's son stated "I will make sure my dad gets what he needs to live."   Medical Information  Briefly describe your medical history, surgeries and why you are here for evaluation.  "I started getting sick around 2011. Up until then, I was doing pretty good so this has been really hard for me.  My heart has continued to deteriorate and now they say I need to have a transplant." Patient is being worked up for this. "The doctors say I'll need the LVAD to keep me stable until I can get a transplant.". (See Hx & Phy for Medical history"  Past Surgical History : See Hx & PHY  Family History: No family history on file  Are you able to complete your ADL's? Currently-  Yes Do you have any history of trauma?No history of physical, sexual, verbal or emotional trauma per patient. Do you have any family history of heart problems? Yes- my father Do you smoke?  No Do you drink alcohol?  No If so, how many drinks a day/week?  NA Have you ever used illegal drugs or misused medications?  No If yes, what drugs do you use and how often?  No *Have you ever been treated for substance abuse? No If yes, where and when were you treated? NA *Are you currently using illegal substances? No  Mental Health History  Have you ever had problems with depression, anxiety or other mental health issues? No If yes, have you seen a counselor, psychiatrist or therapist? " No, Never If you are currently experiencing problems are you interested in talking with a professional?  "No. If I had a problem- I would ask to talk to you  (CSW)" Would you be interested in participating in an LVAD support group?   Yes Have you or are you taking any medications for anxiety/depression or any mental health concerns? No If yes, Please list the medications? No  How have you been feeling in the past year?  I have been getting worse as the year as progressed. It's been hard on me to see myself failing.  I want to be strong but sometimes I just can't seem to do anything. It is frustrating.  I keep a positive  attitude though and I know, with GOD's help- that I'll get through this."  How do you handle stressful situations?:  "I talk to my kids (he has a 63 year old son and a 58 year old son".  I watch TV too or read my bible.  I try to be upbeat."  What are your coping strategies? Please List:  Same as above  Have you had any past or current thoughts of suicide? "No. NEVER."  How many hours do you sleep at night?  "4-6 hours a night- it depends on how I'm feeling"  How is your appetite?  "Good appetite for the most part." *PHQ2- Depression Screen:  Administered:  Score 0 *PHQ9 Depression screen (only complete if the PHQ2 is positive):  NA   Legal  Do you currently have any legal issues/problems? No legal issues but I am trying to get disability but keep getting turned down.    Durable POA or Legal Next of Kin:  None Name:  Number:   Plan for VAD Implementation  Do you know and understand what happens during VAD surgery? Please describe your thoughts : Patient was able to verbalize to CSW a good understanding of the actual surgery and the specifics of the surgery.  He states that he has been talking a great deal to Dr. Gala Romney and Ulla Potash, VAD coordinator as well as watched the video and spoke with one of the programs current patients.  "I can't remember his name but he was very helpful."  I felt less afraid after this."  What do you know about the risks of any major surgery or use of general anesthesia? Patient was able to state that he was told of the possiblity of a stroke, infection and certainly the possibility of death.  He also stated that he knows that this is only a bridge to transplant so he is encouraged.  What do you know about the risks and side effects associated with VAD surgery? Patient's responses were the same as above.    Explain what will happen right after surgery? Patient was able to understand that after surgery he would be on a ventilator for a while; there would be many IV's and he would be transferred to the ICU  (probably 2300) for stabilization.  "They told me that I would be  "in and out" of being awake."    What is your plan for transportation for the first 8 weeks post-surgery? (Patients are not recommended to drive post-surgery for 8 weeks): Driver:  His sister, parents or his eldest son Valid license:  Yes Working Administrator, sports:  Yes- his vehicle has current Hospital doctor Patient's sister and parents state their cars are currently up to date as well.  Airbags: All the cars that will be utilized have air bags.  CSW discussed with patient the issues regarding airbags, front seat riding etc.  This has also been reviewed with patient/family by the VAD Coordinator.   Do you plan to disarm the airbags- there is a risk of discharging the device if the airbag were to deploy:  No- patient does not plan to disarm airbags but agrees that sitting in back seat is safer  What do you know about your diet post-surgery? "I know that I will be on a Heart Healthy diet- I've been trying for quite a while now to eat right.  Sometimes it is hard.  CSW noted that his family could benefit from more dietary education.    How do  you plan to monitor your medications, current and future?  Patient currently uses a pill box to keep his meds straight. He has always been totally responsible for his medication administration. Family is not anticipated to be involved in this.   How do you plan to complete ADL's post-surgery (Shower, dress, etc.)? " My father and son will help me with any bathing/dressing issues until I'm able to do so.  My sister will help with dressing changes."   Will it be difficult to ask for help for your caregivers?  If so, explain:  Although patient denied, CSW noted during the patient/ family meeting that all family members appeared somewhat reserved and were very quiet.  CSW had to elicit responses from the family through direct questioning.  Patient relates that he will have no problem asking for help; however, his past history shows a very independent,  private man and he is accoustomed to taking care of himself.     Please explain what you hope will be improved about your life as a result of receiving the VAD :  "They tell me I'll be able to walk and move around without being so exhausted all the time and that eventually I could even get back to work.  Work has been very important to me. I don't want to just sit around all the time. I want to see my 94 year old son grow up to be a man too."  Please tell me your biggest concern or fear about receiving the VAD?:  "I don't really have any fears right now because everything has been explained to me so much by so many people- everyone is open and honest.  I just want to get through the surgery and then I will set my mind and my heart on getting the transplant.  I want to be well." Patient's mother stated "I just hope they can help him before his heart stops working"  Please explain your understanding of how your body will change? "I know the device will be something I will have on the outside of my chest and I"ll have the battery packs that I'll have to wear when I'm not 'plugged in' at home.  But I feel this is only temporary until I can get a new heart so that's something to look forward to!"    Are you worried about these changes? "No- I'm ok.  If it will give me more time on earth- I can deal with anything! I only have to deal with myself and what I think."  Do you see any barriers to your surgery or follow up?  If yes, please explain:  None  Understanding of LVAD:  Surgical procedures and risks:  Reviewed and discussed Electrical need for LVAD:  Reviewed and discussed Safety precautions with LVAD( Water, etc.):  Reviewed and discussed Potential side effects with LVAD:  Reviewed and discussed Types of Advanced heart failure therapies available: Reviewed and discussed LVAD daily self-care (dressing changes, computer check, extra supplies): Reviewed and discussed Outpatient follow-up (follow -up  in LVAD clinic; monitoring blood thinners): Discussed. This will be addressed more with the VAD coordinator.  Need for emergency planning: Reviewed and discussed Expectations for LVAD:  Patient was able to indicate a good and strong understanding of the surgery, the device and life style changes that will affect him after surgery. Current level of motivation to prepare for LVAD: Patient appears ready and happy to be proceeding with the surgery Patient's perception of need for  LVAD: Strong understanding. Present level of consent for LVAD: Full consent and patient understands risks and benefits  Reasons for seeking LVAD:  "I can't live without this surgery."   (Realistic and appropriate)   Psychosocial Protective Factors :Very positive mental attitude, seems to have good coping skills, strong independent and determined nature, no depression or anxiety noted.  His family, while somewhat stoic and very quiet, appear to be loving and concerned about patients welfare and improvement. Patient seems to be extremely motivated and is seeking next step in his recovery- hopefully getting a transplant.  He has reliable transportation and both primary and secondary caregivers appear committed to helping him once he is home.  He does not have any history or current use of tobacco, alcohol or illegal substances. He does have current insurance.   Psychosocial Risk Factors  His financial situation is uncertain. He currently has no income due to inability to work; no disability or Medicaid. He is already experiencing some financial worries with his house payment and monthly bills but appears confident that his family will help him until he is able to make other arrangements.   Clinical Intervention :  None at this time. CSW will provide in room visits during period of recovery for support, encouragement and to assist with concerns or issues as needed.  Educated patient/family on the following:  Caregiver(s) role  responsibilities: Discussed and reviewed Financial planning for LVAD: Discussed and reviewed  Role of Clinical Social Worker:Discussed and reviewed  Signs of depression and anxiety: None noted at this time. Patient was encouraged to relay to CSW or members of the HF team any feelings of depression or anxiety Support planning for LVAD: Discussed and review LVAD process: VAD coordinator has had intensive contact with patient with ongoing support provided. Other HF team members have also been very supportive.   Caregiver contract/agreement:  Discussed Referral(s) to:  Discussed and reviewed Community Resources:  None at this time. Patient is agreeable to support group in the future as well as home health  Clinical Impression:   From a psychosocial perspective- Mr. Drees appears to be an excellent candidate for implantation of the LVAD device.  His positive attitude and inner strength shines through as he was able to talk about his hopes and desires for future recovery.  He has a strong work ethic and feels he will have good support from his family.  He is highly motivated and appears adaptable to the changes that he will soon be facing. This procedure is hopefully a bridge to transplant for patient and he is able to express an excitement for the next phase of his recovery.  He does not use illegal substances and he denies any mental health issues which could potentially hinder his recovery. CSW has noted in the assessment some concern about his family's seemingly limited energy for this procedure and they do not seem to maintain the same positive spirit that patient possess. However, CSW has discussed in great detail with Ulla Potash, LVAD Coordinator who, in her interactions with the family, has seen an improvement in not only their interaction, but also in showing a more positive attitude.  It is very possible that this family simply felt overwhelmed with the original assessment process and CSW will  provide as much support to this family as possible.  Finally, there are some financial concerns as patient does not have any income at this time.  He does have active medical insurance and he feels that his family will  help him.  He also has active plans to obtain money for the future.  CSW spoke individually with patient's son Robert [name] who indicated that he works and he will make sure that his father has what he needs financially.     Recommendations:  CSW recommends that patient be considered for LVAD implantation if deemed medically appropriate.  CSW will follow up with patient and family regarding financial resources as well as possible after care needs when he is stable for d/c home.  Plan:  CSW will provide ongoing support to patient and family during and after surgery as he proceeds with recovery.    The home inspection worksheet has been reviewed with patient and family by the VAD coordinator. CSW will assist and support any issues regarding this.  CSW will also monitor for financial needs and review with patient/family as recovery continues.   [name]. [name]  [phone]

## 2012-08-09 ENCOUNTER — Telehealth (HOSPITAL_COMMUNITY): Payer: Self-pay | Admitting: *Deleted

## 2012-08-09 DIAGNOSIS — I5022 Chronic systolic (congestive) heart failure: Secondary | ICD-10-CM

## 2012-08-09 NOTE — Telephone Encounter (Signed)
Per Ulla Potash, NP pt needs INR tomorrow order placed pt aware

## 2012-08-10 ENCOUNTER — Ambulatory Visit (HOSPITAL_COMMUNITY): Payer: Self-pay | Admitting: Anesthesiology

## 2012-08-10 ENCOUNTER — Other Ambulatory Visit: Payer: BC Managed Care – PPO

## 2012-08-10 DIAGNOSIS — Z95811 Presence of heart assist device: Secondary | ICD-10-CM

## 2012-08-10 DIAGNOSIS — I5022 Chronic systolic (congestive) heart failure: Secondary | ICD-10-CM

## 2012-08-23 ENCOUNTER — Encounter (HOSPITAL_COMMUNITY): Payer: Self-pay

## 2012-08-23 ENCOUNTER — Ambulatory Visit (HOSPITAL_COMMUNITY): Payer: Self-pay | Admitting: Anesthesiology

## 2012-08-23 ENCOUNTER — Ambulatory Visit (HOSPITAL_COMMUNITY)
Admission: RE | Admit: 2012-08-23 | Discharge: 2012-08-23 | Disposition: A | Discharge status: Home / Self Care | Payer: BC Managed Care – PPO | Source: Ambulatory Visit | Attending: Internal Medicine | Admitting: Internal Medicine

## 2012-08-23 VITALS — BP 118/1 | HR 96 | Wt 225.5 lb

## 2012-08-23 DIAGNOSIS — Z95811 Presence of heart assist device: Secondary | ICD-10-CM

## 2012-08-23 DIAGNOSIS — Z95818 Presence of other cardiac implants and grafts: Secondary | ICD-10-CM

## 2012-08-23 DIAGNOSIS — I5022 Chronic systolic (congestive) heart failure: Secondary | ICD-10-CM | POA: Insufficient documentation

## 2012-08-23 LAB — BASIC METABOLIC PANEL
BUN: 14 mg/dL (ref 6–23)
Calcium: 8.8 mg/dL (ref 8.4–10.5)
GFR calc non Af Amer: 66 mL/min — ABNORMAL LOW (ref >90)
Glucose, Bld: 90 mg/dL (ref 70–99)

## 2012-08-23 LAB — CBC
Hemoglobin: 12.6 g/dL — ABNORMAL LOW (ref 13.0–17.0)
MCH: 26.6 pg (ref 26.0–34.0)
MCHC: 31.9 g/dL (ref 30.0–36.0)
Platelets: 152 10*3/uL (ref 150–400)

## 2012-08-23 LAB — PROTIME-INR: Prothrombin Time: 27.1 seconds — ABNORMAL HIGH (ref 11.6–15.2)

## 2012-08-23 MED ORDER — ISOSORBIDE MONONITRATE ER 30 MG PO TB24: 15.0000 mg | ORAL_TABLET | Freq: Every day | ORAL | Status: DC

## 2012-08-23 MED ORDER — HYDRALAZINE HCL 25 MG PO TABS: 25.0000 mg | ORAL_TABLET | Freq: Three times a day (TID) | ORAL | Status: DC

## 2012-08-23 NOTE — Patient Instructions (Addendum)
Start Hydralazine 25 mg three times a day.  Start Imdur 15 mg (1/2 tab) daily.  If notice any dizziness or fatigue stop taking medications and call me.  Follow up 1 month.  Keep up the good work.

## 2012-08-23 NOTE — Patient Instructions (Signed)
Refer to anticoag flowsheet.

## 2012-08-23 NOTE — Assessment & Plan Note (Signed)
Interrogated personally. Minimal PI events. Driveline looks great and is 3/4 totally incorporated. Educated the patient on how to take showers and use his shower bag.

## 2012-08-23 NOTE — Assessment & Plan Note (Signed)
NYHA I on VAD support. Volume status good. MAP remains elevated 118, will start hydralazine 25 mg TID and Imdur 15 mg. Instructed patient if he notices any dizziness to stop taking meds and to call office. Continue to exercise. Will talk to [facility] this week about plan for patient for transplant. Labs stable. Will recheck INR in 2 weeks. Follow up 1 month.

## 2012-08-23 NOTE — Progress Notes (Signed)
Patient ID: Robert [name], male   DOB: 08-08-1955, 57 y.o.   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 Cath 2011 showed normal coronaries. He also has a history of VTach.   In December 2012 he was referred to [facility] for transplant evaluation and in early February had a repeat heart cath (@[facility] ) and was started on home milrinone. In mid February he presented with worsening HF symptoms and had was placed on IABP before a BTT VAD was placed on 05/30/12.  Follow Up:  Doing well and reports he is mowing lawns and working in the yard. Riding stationary bicycle 25 min a day. Weight at home 216-218 lbs. Denies SOB, orthopnea, edema, or CP. Taking medications as prescribed.   Denies LVAD alarms.  Denies driveline trauma, erythema or drainage.  Denies ICD shocks.   Reports taking Coumadin as prescribed and adherence to anticoagulation based dietary restrictions.  Denies bright red blood per rectum or melena, no dark urine or hematuria.    Past Medical History  Diagnosis Date  . AICD (automatic cardioverter/defibrillator) present   . Hypertension   . Congestive heart failure   . Pneumonia   . Bronchitis     Current Outpatient Prescriptions  Medication Sig Dispense Refill  . allopurinol (ZYLOPRIM) 300 MG tablet Take 1 tablet (300 mg total) by mouth daily.  30 tablet  3  . amiodarone (PACERONE) 400 MG tablet Take 1 tablet (400 mg total) by mouth daily.  30 tablet  3  . amLODipine (NORVASC) 10 MG tablet Take 1 tablet (10 mg total) by mouth daily.  30 tablet  3  . amoxicillin (AMOXIL) 500 MG capsule Take 4 tabs 1 hour prior to dental work  4 capsule  3  . aspirin 81 MG chewable tablet Chew 81 mg by mouth daily.      . furosemide (LASIX) 40 MG tablet Take 40 mg by mouth as needed.      Marland Kitchen lisinopril (PRINIVIL,ZESTRIL) 40 MG tablet Take  1 tablet (40 mg total) by mouth daily.      . pantoprazole (PROTONIX) 40 MG tablet Take 1 tablet (40 mg total) by mouth daily.  30 tablet  3  . potassium chloride SA (K-DUR,KLOR-CON) 20 MEQ tablet Take 20 mEq by mouth as needed.      . warfarin (COUMADIN) 5 MG tablet Take 7.5 mg (1 1/2 tablets) on Tues, Thurs, Sat and then 5 mg on Mon, Wed, Fri, and Sunday  45 tablet  3   No current facility-administered medications for this encounter.    Review of patient's allergies indicates no known allergies.  REVIEW OF SYSTEMS: All systems negative except as listed in HPI, PMH and Problem list.   LVAD INTERROGATION:   HeartMate II LVAD:  Flow 5.9 liters/min, speed 9990, power 7.1, PI 6.1.  Back-up speed 9400.   I reviewed the LVAD parameters from today, and compared the results to the patient's prior recorded data.  No programming changes were made.  The LVAD is functioning within specified parameters.  The patient performs LVAD self-test daily.  LVAD interrogation was negative for any significant power changes, alarms or PI events/speed drops.  LVAD equipment check completed and is in good working order.  Back-up equipment present.   LVAD education done on emergency procedures and precautions and reviewed exit site  care.  Few PI events Physical Exam: Filed Vitals:   08/23/12 1045  BP: 118/1  Pulse: 96  Weight: 225 lb 8 oz (102.286 kg)  SpO2: 98%  Last weight: 218  GENERAL: Well appearing, male; NAD HEENT: normal  NECK: Supple, JVP flat .2+ bilaterally, no bruits.  No lymphadenopathy or thyromegaly appreciated.   CARDIAC:  LVAD hum present.  LUNGS:  Clear to auscultation bilaterally.  ABDOMEN:  Soft, round, nontender, positive bowel sounds x4.     LVAD exit site: well-healed and semi-incorporated; Dressing dry and intact.  No erythema or drainage.  Stabilization device present and accurately applied.  Driveline dressing is being changed daily per sterile technique; changed in  clinic EXTREMITIES:  Warm and dry, no cyanosis, clubbing, rash or edema  NEUROLOGIC:  Alert and oriented x 4.  Gait steady.  No aphasia.  No dysarthria.  Affect pleasant.       ASSESSMENT AND PLAN:

## 2012-08-29 ENCOUNTER — Telehealth (HOSPITAL_COMMUNITY): Payer: Self-pay | Admitting: Anesthesiology

## 2012-08-29 NOTE — Telephone Encounter (Signed)
Patient called stating since starting new medications hydralazine and Imdur that he has felt awful with headaches, will stop Imdur at this time and continue hydralazine. Told to call back if he continues to feel bad.

## 2012-09-03 ENCOUNTER — Other Ambulatory Visit: Payer: Self-pay | Admitting: Cardiovascular Disease

## 2012-09-03 DIAGNOSIS — I428 Other cardiomyopathies: Secondary | ICD-10-CM

## 2012-09-03 DIAGNOSIS — I509 Heart failure, unspecified: Secondary | ICD-10-CM

## 2012-09-03 LAB — ICD DEVICE OBSERVATION

## 2012-09-05 ENCOUNTER — Telehealth (HOSPITAL_COMMUNITY): Payer: Self-pay | Admitting: *Deleted

## 2012-09-05 DIAGNOSIS — I5022 Chronic systolic (congestive) heart failure: Secondary | ICD-10-CM

## 2012-09-05 NOTE — Telephone Encounter (Signed)
Per Ulla Potash, NP pt needs INR tomorrow pt aware order placed for [place]

## 2012-09-06 ENCOUNTER — Other Ambulatory Visit: Payer: BC Managed Care – PPO

## 2012-09-07 ENCOUNTER — Other Ambulatory Visit (INDEPENDENT_AMBULATORY_CARE_PROVIDER_SITE_OTHER): Payer: BC Managed Care – PPO

## 2012-09-07 ENCOUNTER — Ambulatory Visit (HOSPITAL_COMMUNITY): Payer: Self-pay | Admitting: Anesthesiology

## 2012-09-07 DIAGNOSIS — Z95818 Presence of other cardiac implants and grafts: Secondary | ICD-10-CM

## 2012-09-07 DIAGNOSIS — Z79899 Other long term (current) drug therapy: Secondary | ICD-10-CM

## 2012-09-07 DIAGNOSIS — Z95811 Presence of heart assist device: Secondary | ICD-10-CM

## 2012-09-07 DIAGNOSIS — I5022 Chronic systolic (congestive) heart failure: Secondary | ICD-10-CM

## 2012-09-07 LAB — PROTIME-INR: INR: 2 ratio — ABNORMAL HIGH (ref 0.8–1.0)

## 2012-09-11 ENCOUNTER — Encounter: Payer: Self-pay | Admitting: *Deleted

## 2012-09-11 LAB — REMOTE ICD DEVICE
AL AMPLITUDE: 2 mv
BAMS-0001: 171 {beats}/min
BATTERY VOLTAGE: 3.02 V
DEV-0020ICD: NEGATIVE
FVT: 0
MODE SWITCH EPISODES: 0
PACEART VT: 0
TZAT-0001SLOWVT: 1
TZAT-0013SLOWVT: 3
TZAT-0013SLOWVT: 3
TZAT-0018SLOWVT: NEGATIVE
TZON-0003AFLUTTER: 350.8 ms
TZON-0003SLOWVT: 370.3 ms
TZST-0001SLOWVT: 3
TZST-0001SLOWVT: 5
TZST-0003SLOWVT: 35 J
TZST-0003SLOWVT: 35 J

## 2012-09-19 ENCOUNTER — Telehealth (HOSPITAL_COMMUNITY): Payer: Self-pay | Admitting: *Deleted

## 2012-09-19 DIAGNOSIS — I5022 Chronic systolic (congestive) heart failure: Secondary | ICD-10-CM

## 2012-09-19 NOTE — Telephone Encounter (Signed)
Per Ulla Potash, NP pt is due for INR 6/18, pt also needs RHC per [facility], pt will get labs at [facility] 6/18, order placed will call back to sch cath

## 2012-09-20 ENCOUNTER — Ambulatory Visit (HOSPITAL_COMMUNITY): Payer: Self-pay | Admitting: Anesthesiology

## 2012-09-20 ENCOUNTER — Other Ambulatory Visit (INDEPENDENT_AMBULATORY_CARE_PROVIDER_SITE_OTHER): Payer: BC Managed Care – PPO

## 2012-09-20 DIAGNOSIS — Z95811 Presence of heart assist device: Secondary | ICD-10-CM

## 2012-09-20 DIAGNOSIS — I5022 Chronic systolic (congestive) heart failure: Secondary | ICD-10-CM

## 2012-09-20 LAB — BASIC METABOLIC PANEL
CO2: 24 mEq/L (ref 19–32)
Calcium: 8 mg/dL — ABNORMAL LOW (ref 8.4–10.5)
GFR: 72.51 mL/min (ref >60.00)
Glucose, Bld: 95 mg/dL (ref 70–99)
Potassium: 3.7 mEq/L (ref 3.5–5.1)
Sodium: 135 mEq/L (ref 135–145)

## 2012-09-20 LAB — CBC
MCV: 83.5 fl (ref 78.0–100.0)
RBC: 4.58 Mil/uL (ref 4.22–5.81)
RDW: 18.6 % — ABNORMAL HIGH (ref 11.5–14.6)
WBC: 6.3 10*3/uL (ref 4.5–10.5)

## 2012-09-20 LAB — PROTIME-INR: INR: 2 ratio — ABNORMAL HIGH (ref 0.8–1.0)

## 2012-09-21 ENCOUNTER — Other Ambulatory Visit (HOSPITAL_COMMUNITY): Payer: Self-pay | Admitting: Adult Health

## 2012-09-21 DIAGNOSIS — I5022 Chronic systolic (congestive) heart failure: Secondary | ICD-10-CM

## 2012-09-21 NOTE — Telephone Encounter (Signed)
Cath sch for 6/23 at 7:30 instructions reviewed with him via phone, will discuss holding coumadin and call pt back tomorrow with that info, he is aware and verbalized understanding

## 2012-09-25 ENCOUNTER — Encounter (HOSPITAL_BASED_OUTPATIENT_CLINIC_OR_DEPARTMENT_OTHER)
Admission: RE | Disposition: A | Discharge status: Home / Self Care | Payer: Self-pay | Source: Ambulatory Visit | Attending: Internal Medicine

## 2012-09-25 ENCOUNTER — Inpatient Hospital Stay (HOSPITAL_BASED_OUTPATIENT_CLINIC_OR_DEPARTMENT_OTHER)
Admission: RE | Admit: 2012-09-25 | Discharge: 2012-09-25 | Disposition: A | Discharge status: Home / Self Care | Payer: BC Managed Care – PPO | Source: Ambulatory Visit | Attending: Internal Medicine | Admitting: Internal Medicine

## 2012-09-25 DIAGNOSIS — I5022 Chronic systolic (congestive) heart failure: Secondary | ICD-10-CM | POA: Insufficient documentation

## 2012-09-25 DIAGNOSIS — I428 Other cardiomyopathies: Secondary | ICD-10-CM | POA: Insufficient documentation

## 2012-09-25 DIAGNOSIS — I509 Heart failure, unspecified: Secondary | ICD-10-CM

## 2012-09-25 DIAGNOSIS — Z95810 Presence of automatic (implantable) cardiac defibrillator: Secondary | ICD-10-CM | POA: Insufficient documentation

## 2012-09-25 DIAGNOSIS — I059 Rheumatic mitral valve disease, unspecified: Secondary | ICD-10-CM | POA: Insufficient documentation

## 2012-09-25 DIAGNOSIS — I10 Essential (primary) hypertension: Secondary | ICD-10-CM | POA: Insufficient documentation

## 2012-09-25 LAB — POCT I-STAT 3, VENOUS BLOOD GAS (G3P V)
Acid-base deficit: 1 mmol/L (ref 0.0–2.0)
Bicarbonate: 24.8 mEq/L — ABNORMAL HIGH (ref 20.0–24.0)
Bicarbonate: 26.2 mEq/L — ABNORMAL HIGH (ref 20.0–24.0)
O2 Saturation: 69 %
O2 Saturation: 73 %
TCO2: 28 mmol/L (ref 0–100)
pCO2, Ven: 46.7 mmHg (ref 45.0–50.0)
pCO2, Ven: 47.6 mmHg (ref 45.0–50.0)
pO2, Ven: 39 mmHg (ref 30.0–45.0)

## 2012-09-25 SURGERY — JV RIGHT HEART CATHETERIZATION
Anesthesia: Moderate Sedation

## 2012-09-25 MED ORDER — SODIUM CHLORIDE 0.9 % IJ SOLN: 3.0000 mL | Freq: Two times a day (BID) | INTRAMUSCULAR | Status: DC

## 2012-09-25 MED ORDER — ACETAMINOPHEN 325 MG PO TABS: 650.0000 mg | ORAL_TABLET | ORAL | Status: DC | PRN

## 2012-09-25 MED ORDER — SODIUM CHLORIDE 0.9 % IJ SOLN: 3.0000 mL | INTRAMUSCULAR | Status: DC | PRN

## 2012-09-25 MED ORDER — SODIUM CHLORIDE 0.9 % IV SOLN: 250.0000 mL | INTRAVENOUS | Status: DC | PRN

## 2012-09-25 MED ORDER — ONDANSETRON HCL 4 MG/2ML IJ SOLN: 4.0000 mg | Freq: Four times a day (QID) | INTRAMUSCULAR | Status: DC | PRN

## 2012-09-25 MED ORDER — SODIUM CHLORIDE 0.9 % IV SOLN
INTRAVENOUS | Status: DC
  Administered 2012-09-25: 07:00:00 via INTRAVENOUS

## 2012-09-25 NOTE — CV Procedure (Signed)
Cardiac Cath Procedure Note:  Indication:  HF pre-transplant  Procedures performed:  1) Right heart catheterization  Description of procedure:   The risks and indication of the procedure were explained. Consent was signed and placed on the chart. An appropriate timeout was taken prior to the procedure. The right neck was prepped and draped in the routine sterile fashion and anesthetized with 1% local lidocaine.   A 7 FR venous sheath was placed in the right internal jugular vein using a modified Seldinger technique. A standard Swan-Ganz catheter was used for the procedure.   Complications: None apparent.  Findings:  RA = 4 RV = 15/0/4 PA = 18/3 (10) PCW = 5 Fick cardiac output/index = 6.2/2.7 PVR =  0.8 Woods FA sat = 98% PA sat = 69%, 73%  Assessment: 1. Well compensated hemodynamics with VAD support  Plan/Discussion:  Continue with transplant w/u.  Truman Hayward 8:56 AM    Arvilla Meres 8:53 AM

## 2012-09-25 NOTE — Progress Notes (Signed)
Bedrest begins @ 0925, tegaderm and vaseline  dressing applied to right internal jugular site by Army Melia RN, site level 0.

## 2012-09-25 NOTE — Consult Note (Signed)
Patient ID: Robert [name], male DOB: 11/29/1955, 57 y.o. MRN: [id number]  HPI:  Mr. 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 Cath 2011 showed normal coronaries. He also has a history of VTach.  In December 2012 he was referred to [facility] for transplant evaluation and in early February had a repeat heart cath (@[facility] ) and was started on home milrinone. In mid February he presented with worsening HF symptoms and had was placed on IABP before a BTT VAD was placed on 05/30/12.   Follow Up:  Doing well and reports he is mowing lawns and working in the yard. Riding stationary bicycle 25 min a day. Weight stable. noproblem with VAD. Seen at [facility] recently and transplant w/u resumed. Here for elective RHC as part of transplant w/u.    Past Medical History   Diagnosis  Date   .  AICD (automatic cardioverter/defibrillator) present    .  Hypertension    .  Congestive heart failure    .  Pneumonia    .  Bronchitis     Current Outpatient Prescriptions   Medication  Sig  Dispense  Refill   .  allopurinol (ZYLOPRIM) 300 MG tablet  Take 1 tablet (300 mg total) by mouth daily.  30 tablet  3   .  amiodarone (PACERONE) 400 MG tablet  Take 1 tablet (400 mg total) by mouth daily.  30 tablet  3   .  amLODipine (NORVASC) 10 MG tablet  Take 1 tablet (10 mg total) by mouth daily.  30 tablet  3   .  amoxicillin (AMOXIL) 500 MG capsule  Take 4 tabs 1 hour prior to dental work  4 capsule  3   .  aspirin 81 MG chewable tablet  Chew 81 mg by mouth daily.     .  furosemide (LASIX) 40 MG tablet  Take 40 mg by mouth as needed.     Marland Kitchen  lisinopril (PRINIVIL,ZESTRIL) 40 MG tablet  Take 1 tablet (40 mg total) by mouth daily.     .  pantoprazole (PROTONIX) 40 MG tablet  Take 1 tablet (40 mg total) by mouth daily.  30 tablet  3   .  potassium chloride SA (K-DUR,KLOR-CON) 20 MEQ tablet   Take 20 mEq by mouth as needed.     .  warfarin (COUMADIN) 5 MG tablet  Take 7.5 mg (1 1/2 tablets) on Tues, Thurs, Sat and then 5 mg on Mon, Wed, Fri, and Sunday  45 tablet  3    No current facility-administered medications for this encounter.    Review of patient's allergies indicates no known allergies.      Physical Exam:  Filed Vitals:    08/23/12 1045   BP:  118/1   Pulse:  96   Weight:  225 lb 8 oz (102.286 kg)   SpO2:  98%   Last weight: 218  GENERAL: Well appearing, male; NAD  HEENT: normal  NECK: Supple, JVP flat .2+ bilaterally, no bruits. No lymphadenopathy or thyromegaly appreciated.  CARDIAC: LVAD hum present.  LUNGS: Clear to auscultation bilaterally.  ABDOMEN: Soft, round, nontender, positive bowel sounds x4.  LVAD exit site: well-healed and semi-incorporated; Dressing dry and intact. No erythema or drainage. Stabilization device present and accurately applied. Driveline dressing is being changed daily per sterile technique; changed in clinic  EXTREMITIES:  Warm and dry, no cyanosis, clubbing, rash or edema  NEUROLOGIC: Alert and oriented x 4. Gait steady. No aphasia. No dysarthria. Affect pleasant.   ASSESSMENT AND PLAN:   1> Chronic systolic HF s/p HM II VAD 2/14 - here for RHC as part of tranplant work-up.  Daniel Bensimhon,MD 8:23 AM

## 2012-09-26 ENCOUNTER — Encounter (HOSPITAL_COMMUNITY): Payer: BC Managed Care – PPO

## 2012-09-28 ENCOUNTER — Ambulatory Visit (HOSPITAL_COMMUNITY): Payer: BC Managed Care – PPO

## 2012-10-03 ENCOUNTER — Telehealth (HOSPITAL_COMMUNITY): Payer: Self-pay | Admitting: *Deleted

## 2012-10-03 DIAGNOSIS — I5022 Chronic systolic (congestive) heart failure: Secondary | ICD-10-CM

## 2012-10-03 NOTE — Telephone Encounter (Signed)
Per Robert Potash, NP pt needs INR tomorrow, pt aware and will go to [facility]

## 2012-10-04 ENCOUNTER — Other Ambulatory Visit (INDEPENDENT_AMBULATORY_CARE_PROVIDER_SITE_OTHER): Payer: BC Managed Care – PPO

## 2012-10-04 ENCOUNTER — Ambulatory Visit (HOSPITAL_COMMUNITY): Payer: Self-pay | Admitting: Anesthesiology

## 2012-10-04 DIAGNOSIS — I5022 Chronic systolic (congestive) heart failure: Secondary | ICD-10-CM

## 2012-10-04 DIAGNOSIS — Z95811 Presence of heart assist device: Secondary | ICD-10-CM

## 2012-10-04 LAB — PROTIME-INR
INR: 3 ratio — ABNORMAL HIGH (ref 0.8–1.0)
Prothrombin Time: 31 s — ABNORMAL HIGH (ref 10.2–12.4)

## 2012-10-08 ENCOUNTER — Other Ambulatory Visit: Payer: Self-pay | Admitting: Cardiovascular Disease

## 2012-10-08 DIAGNOSIS — I428 Other cardiomyopathies: Secondary | ICD-10-CM

## 2012-10-08 DIAGNOSIS — I509 Heart failure, unspecified: Secondary | ICD-10-CM

## 2012-10-10 ENCOUNTER — Other Ambulatory Visit (HOSPITAL_COMMUNITY): Payer: Self-pay | Admitting: *Deleted

## 2012-10-10 MED ORDER — LISINOPRIL 40 MG PO TABS: 40.0000 mg | ORAL_TABLET | Freq: Every day | ORAL | Status: DC

## 2012-10-11 ENCOUNTER — Encounter: Payer: Self-pay | Admitting: Cardiovascular Disease

## 2012-10-17 ENCOUNTER — Ambulatory Visit (HOSPITAL_COMMUNITY)
Admission: RE | Admit: 2012-10-17 | Discharge: 2012-10-17 | Disposition: A | Discharge status: Home / Self Care | Payer: BC Managed Care – PPO | Source: Ambulatory Visit | Attending: Cardiology | Admitting: Cardiology

## 2012-10-17 ENCOUNTER — Other Ambulatory Visit: Payer: Self-pay | Admitting: *Deleted

## 2012-10-17 ENCOUNTER — Telehealth: Payer: Self-pay | Admitting: *Deleted

## 2012-10-17 VITALS — BP 108/0 | HR 88 | Wt 228.0 lb

## 2012-10-17 DIAGNOSIS — I10 Essential (primary) hypertension: Secondary | ICD-10-CM | POA: Insufficient documentation

## 2012-10-17 DIAGNOSIS — Z5181 Encounter for therapeutic drug level monitoring: Secondary | ICD-10-CM

## 2012-10-17 DIAGNOSIS — I509 Heart failure, unspecified: Secondary | ICD-10-CM | POA: Insufficient documentation

## 2012-10-17 DIAGNOSIS — I209 Angina pectoris, unspecified: Secondary | ICD-10-CM | POA: Insufficient documentation

## 2012-10-17 DIAGNOSIS — Z79899 Other long term (current) drug therapy: Secondary | ICD-10-CM

## 2012-10-17 DIAGNOSIS — I5022 Chronic systolic (congestive) heart failure: Secondary | ICD-10-CM | POA: Diagnosis present

## 2012-10-17 DIAGNOSIS — I472 Ventricular tachycardia: Secondary | ICD-10-CM

## 2012-10-17 DIAGNOSIS — Z95811 Presence of heart assist device: Secondary | ICD-10-CM

## 2012-10-17 DIAGNOSIS — I428 Other cardiomyopathies: Secondary | ICD-10-CM | POA: Insufficient documentation

## 2012-10-17 DIAGNOSIS — R0602 Shortness of breath: Secondary | ICD-10-CM | POA: Insufficient documentation

## 2012-10-17 DIAGNOSIS — Z95818 Presence of other cardiac implants and grafts: Secondary | ICD-10-CM

## 2012-10-17 LAB — HEPATIC FUNCTION PANEL
ALT: 39 U/L (ref 0–53)
AST: 30 U/L (ref 0–37)
Albumin: 3.9 g/dL (ref 3.5–5.2)
Alkaline Phosphatase: 93 U/L (ref 39–117)
Total Protein: 7.1 g/dL (ref 6.0–8.3)

## 2012-10-17 LAB — CBC
HCT: 41.1 % (ref 39.0–52.0)
MCH: 26.9 pg (ref 26.0–34.0)
MCV: 82.5 fL (ref 78.0–100.0)
Platelets: 157 10*3/uL (ref 150–400)
RBC: 4.98 MIL/uL (ref 4.22–5.81)
WBC: 5.7 10*3/uL (ref 4.0–10.5)

## 2012-10-17 LAB — BASIC METABOLIC PANEL
CO2: 26 mEq/L (ref 19–32)
Calcium: 8.9 mg/dL (ref 8.4–10.5)
Chloride: 102 mEq/L (ref 96–112)
Glucose, Bld: 88 mg/dL (ref 70–99)
Sodium: 138 mEq/L (ref 135–145)

## 2012-10-17 LAB — PROTIME-INR: INR: 2.89 — ABNORMAL HIGH (ref 0.00–1.49)

## 2012-10-17 MED ORDER — FUROSEMIDE 40 MG PO TABS: 40.0000 mg | ORAL_TABLET | ORAL | Status: DC | PRN

## 2012-10-17 MED ORDER — LISINOPRIL 40 MG PO TABS: 40.0000 mg | ORAL_TABLET | Freq: Every day | ORAL | Status: DC

## 2012-10-17 MED ORDER — HYDRALAZINE HCL 50 MG PO TABS: 50.0000 mg | ORAL_TABLET | Freq: Three times a day (TID) | ORAL | Status: DC

## 2012-10-17 MED ORDER — AMLODIPINE BESYLATE 10 MG PO TABS: 10.0000 mg | ORAL_TABLET | Freq: Every day | ORAL | Status: DC

## 2012-10-17 MED ORDER — POTASSIUM CHLORIDE CRYS ER 20 MEQ PO TBCR: 20.0000 meq | EXTENDED_RELEASE_TABLET | ORAL | Status: DC | PRN

## 2012-10-17 MED ORDER — ASPIRIN EC 325 MG PO TBEC: 325.0000 mg | DELAYED_RELEASE_TABLET | Freq: Every day | ORAL | Status: DC

## 2012-10-17 NOTE — Telephone Encounter (Signed)
Called pt with INR results; INR therapeutic so pt will remain on same coumadin dose. Coumadin 5 mg daily except 7.5 mg Tues, Thurs, and Saturday. Pt will re-check INR in two weeks with Adolph Pollack.

## 2012-10-17 NOTE — Patient Instructions (Addendum)
Increase aspirin to 325 mg daily.  Increase hydralazine to 50 mg three times a day.  Will call with labs.  Return in 6 weeks.

## 2012-10-17 NOTE — Progress Notes (Addendum)
Symptom Yes No Details  Angina         x Activity:  Claudication         x How far  Syncope         x When  Stroke         x   Orthopnea         x How many pillows -  one  PND         x How often  CPAP         x How many hrs  Pedal edema         x   Abd fullness         x   N&V         x   Diaphoresis         x when  Bleeding         x      Denies tea colored urine  SOB         x activity  Palpitations         x when  ICD shock         x   Hospitlizaitons         x When/where/why  ED visit         x When/where/why  Other MD        x      When/who/why    Dr. Allena Katz - [facility] for transplant evaluation  Activity    No limitations  Fluid    No limitations  Diet    No limitations      HPI: Mr. 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 Cath 2011 showed normal coronaries. He also has a history of VTach.   In December 2012 he was referred to [facility] for transplant evaluation and in early February had a repeat heart cath (@[facility] ) and was started on home milrinone. In mid February he presented with worsening HF symptoms and had was placed on IABP before a BTT VAD was placed on 05/30/12.   Follow Up:  Doing well. Has no complaints. Wants to go back to work, but is doing some side jobs. Doing weekly dressing changes and driveline site looks good.  Pt denies any drainage, tenderness, or redness at site. pt is taking daily showers. Denies any SOB, CP or orthopnea.  Denies LVAD alarms.  Denies driveline trauma, erythema or drainage.  Denies ICD shocks.   Reports taking Coumadin as prescribed and adherence to anticoagulation based dietary restrictions.  Denies bright red blood per rectum or melena, no dark urine or hematuria.    Past Medical History  Diagnosis Date  . AICD (automatic cardioverter/defibrillator) present   . Hypertension   . Congestive  heart failure   . Pneumonia   . Bronchitis     Current Outpatient Prescriptions  Medication Sig Dispense Refill  . allopurinol (ZYLOPRIM) 300 MG tablet Take 300 mg by mouth every other day. Pt taking med every other day to save on cost medicine      . amiodarone (PACERONE) 400 MG tablet Take 1 tablet (400 mg total) by mouth daily.  30 tablet  3  . amLODipine (NORVASC) 10 MG tablet Take 1 tablet (10 mg total) by mouth daily.  30 tablet  3  . hydrALAZINE (APRESOLINE)  50 MG tablet Take 1 tablet (50 mg total) by mouth 3 (three) times daily.  90 tablet  3  . lisinopril (PRINIVIL,ZESTRIL) 40 MG tablet Take 1 tablet (40 mg total) by mouth daily.  30 tablet  3  . pantoprazole (PROTONIX) 40 MG tablet Take 1 tablet (40 mg total) by mouth daily.  30 tablet  3  . warfarin (COUMADIN) 5 MG tablet Take 7.5 mg (1 1/2 tablets) on Tues, Thurs, Sat and then 5 mg on Mon, Wed, Fri, and Sunday  45 tablet  3  . amoxicillin (AMOXIL) 500 MG capsule Take 4 tabs 1 hour prior to dental work  4 capsule  3  . aspirin EC 325 MG tablet Take 1 tablet (325 mg total) by mouth daily.  30 tablet  0  . furosemide (LASIX) 40 MG tablet Take 40 mg by mouth as needed.      . potassium chloride SA (K-DUR,KLOR-CON) 20 MEQ tablet Take 20 mEq by mouth as needed.       No current facility-administered medications for this encounter.   Metoprolol  REVIEW OF SYSTEMS: All systems negative except as listed in HPI, PMH and Problem list.  LVAD INTERROGATION:   HeartMate II LVAD:  HM II LVAD: Flow ---, speed 9980, power 6.7, PI 7.0. Low speed limit 9400.  I reviewed the LVAD parameters from today, and compared the results to the patient's prior recorded data with no significant changes noted. The LVAD is functioning within specified parameters. The pt performs LVAD self-test daily. LVAD interrogation was negative for any significant power changes, several PI events noted; no  Alarms. Pt has had several low voltage advisories and states he  had two bars on batteries when these occurred. Reviewed rotating batteries weekly (which he is doing) and checking batteries for re-calibration. Reviewed calibrating batteries. Back up equipment present. LVAD education done on emergency procedures and precautions and reviewed exit site care.  Physical Exam: Filed Vitals:   10/17/12 1106  BP: 108/0  Pulse: 88  Weight: 228 lb (103.42 kg)  SpO2: 100%    GENERAL: Well appearing, male who presents to clinic today in no acute distress. HEENT: normal  NECK: Supple, JVP flat .  2+ bilaterally, no bruits.  No lymphadenopathy or thyromegaly appreciated.  CARDIAC:  Mechanical heart sounds with LVAD hum present.  LUNGS:  Clear to auscultation bilaterally.  ABDOMEN:  Soft, round, nontender, positive bowel sounds x4.     LVAD exit site: well-healed and incorporated.  Dressing dry and intact.  No erythema or drainage.  Stabilization device present and accurately applied.  Driveline dressing is being changed weekly using sterile technique EXTREMITIES:  Warm and dry, no cyanosis, clubbing, rash or edema  NEUROLOGIC:  Alert and oriented x 4.  Gait steady.  No aphasia.  No dysarthria.  Affect pleasant.     ASSESSMENT AND PLAN:   1. Status post LVAD implantation - Patient comes to clinic today for routine follow up visit. Currently has NYHA class II symptoms on LVAD support. Volume status looks great. MAPs elevated will increase hydralazine to 50 mg TID and continue to follow. He is currently listed on the transplant list at [facility] and next appointment is in September, will try to get update from team. Will increase ASA to 325 mg, no signs of bleeding. Follow up in 6 weeks.   2.  Anticoagulation management - INR goal 2.0-3.0.  Will evaluate INR value today and follow up with patient for necessary changes.  INR today  2.89 at goal, will recheck in 2 weeks. As above increase ASA to 325 mg.  3. Hx of V-tach           -will continue Amiodarone 400 mg daily. Draw  LFTs and TSH today.  4. HTN     MAPs elevated 100s and flows --- on LVAD. Will increase hydralazine to 50 mg TID.  Ulla Potash B 1:14 PM  Patient seen with NP and all aspects of the encounter discussed, agree with the above note.   Patient is doing well overall.  LVAD parameters reviewed and stable.  Plan to increase ASA to 325 mg daily today and given elevated MAP, plan to increase hydralazine to 50 mg tid.  Awaiting report from transplant clinic at [facility]   Marca Ancona 10/17/2012

## 2012-10-18 ENCOUNTER — Ambulatory Visit (HOSPITAL_COMMUNITY): Payer: BC Managed Care – PPO

## 2012-10-18 NOTE — Addendum Note (Signed)
Encounter addended by: [name], NP on: 10/18/2012  2:52 PM<BR>     Documentation filed: Notes Section

## 2012-10-19 ENCOUNTER — Encounter (HOSPITAL_COMMUNITY): Payer: BC Managed Care – PPO

## 2012-10-19 ENCOUNTER — Other Ambulatory Visit (HOSPITAL_COMMUNITY): Payer: Self-pay | Admitting: Anesthesiology

## 2012-10-25 ENCOUNTER — Ambulatory Visit (HOSPITAL_COMMUNITY): Payer: Self-pay | Admitting: Anesthesiology

## 2012-10-25 DIAGNOSIS — Z95811 Presence of heart assist device: Secondary | ICD-10-CM

## 2012-10-30 ENCOUNTER — Other Ambulatory Visit: Payer: Self-pay | Admitting: *Deleted

## 2012-10-30 ENCOUNTER — Telehealth: Payer: Self-pay | Admitting: *Deleted

## 2012-10-30 DIAGNOSIS — Z95811 Presence of heart assist device: Secondary | ICD-10-CM

## 2012-10-30 DIAGNOSIS — Z7901 Long term (current) use of anticoagulants: Secondary | ICD-10-CM

## 2012-10-30 NOTE — Telephone Encounter (Signed)
Called pt to remind him to get INR check at [facility] tomorrow; pt agreed to do so.

## 2012-10-31 ENCOUNTER — Ambulatory Visit (HOSPITAL_COMMUNITY): Payer: Self-pay | Admitting: Anesthesiology

## 2012-10-31 ENCOUNTER — Telehealth: Payer: Self-pay | Admitting: *Deleted

## 2012-10-31 ENCOUNTER — Ambulatory Visit (INDEPENDENT_AMBULATORY_CARE_PROVIDER_SITE_OTHER): Payer: BC Managed Care – PPO | Admitting: *Deleted

## 2012-10-31 DIAGNOSIS — Z95811 Presence of heart assist device: Secondary | ICD-10-CM

## 2012-10-31 DIAGNOSIS — Z95818 Presence of other cardiac implants and grafts: Secondary | ICD-10-CM

## 2012-10-31 DIAGNOSIS — Z7901 Long term (current) use of anticoagulants: Secondary | ICD-10-CM

## 2012-10-31 NOTE — Telephone Encounter (Signed)
Called pt with lab results today. INR 3.1; confirmed pt is taking coumadin 7.5 mg daily except 5 mg MWF. Instructed pt to take 5 mg today (instead of 7.5 mg) then return to regular dosing of 7.5 daily except 5 mg MWF. Pt will need INR check in two weeks. Pt verbalized understanding of same.

## 2012-11-06 ENCOUNTER — Telehealth: Payer: Self-pay | Admitting: *Deleted

## 2012-11-06 ENCOUNTER — Ambulatory Visit (HOSPITAL_COMMUNITY)
Admission: RE | Admit: 2012-11-06 | Discharge: 2012-11-06 | Disposition: A | Discharge status: Home / Self Care | Payer: BC Managed Care – PPO | Source: Ambulatory Visit | Attending: Internal Medicine | Admitting: Internal Medicine

## 2012-11-06 VITALS — BP 98/0 | HR 80 | Ht 74.0 in | Wt 227.0 lb

## 2012-11-06 DIAGNOSIS — Z95811 Presence of heart assist device: Secondary | ICD-10-CM

## 2012-11-06 DIAGNOSIS — Z79899 Other long term (current) drug therapy: Secondary | ICD-10-CM | POA: Insufficient documentation

## 2012-11-06 DIAGNOSIS — I509 Heart failure, unspecified: Secondary | ICD-10-CM | POA: Insufficient documentation

## 2012-11-06 DIAGNOSIS — I5022 Chronic systolic (congestive) heart failure: Secondary | ICD-10-CM | POA: Insufficient documentation

## 2012-11-06 DIAGNOSIS — R42 Dizziness and giddiness: Secondary | ICD-10-CM | POA: Insufficient documentation

## 2012-11-06 DIAGNOSIS — Z7982 Long term (current) use of aspirin: Secondary | ICD-10-CM | POA: Insufficient documentation

## 2012-11-06 DIAGNOSIS — I10 Essential (primary) hypertension: Secondary | ICD-10-CM | POA: Insufficient documentation

## 2012-11-06 DIAGNOSIS — Z7901 Long term (current) use of anticoagulants: Secondary | ICD-10-CM | POA: Insufficient documentation

## 2012-11-06 DIAGNOSIS — I428 Other cardiomyopathies: Secondary | ICD-10-CM | POA: Insufficient documentation

## 2012-11-06 DIAGNOSIS — Z95810 Presence of automatic (implantable) cardiac defibrillator: Secondary | ICD-10-CM | POA: Insufficient documentation

## 2012-11-06 DIAGNOSIS — Z95818 Presence of other cardiac implants and grafts: Secondary | ICD-10-CM

## 2012-11-06 MED ORDER — LISINOPRIL 20 MG PO TABS: 20.0000 mg | ORAL_TABLET | Freq: Every day | ORAL | Status: DC

## 2012-11-06 NOTE — Progress Notes (Signed)
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 status post dual chamber Medtronic ICD implanted April 2011. Cath 2011 showed normal coronaries. He also has a history of VTach. Currently listed on 1b list for tx.   Follow up: Pt called clinic c/o dizziness and feeling lightheaded; this started last week. He stopped taking his Lisinopril 40 mg Friday or Saturday of last week.   Denies LVAD alarms.  Denies driveline trauma, erythema or drainage.  Denies ICD shocks.   Reports taking Coumadin as prescribed and adherence to anticoagulation based dietary restrictions.  Denies bright red blood per rectum or melena, no dark urine or hematuria.     Symptom Yes No Details  Angina         x Activity:  Claudication         x How far:  Syncope         x When:  Stroke         x   Orthopnea         x How many pillows:  PND         x How often:  CPAP         x How many hrs:  Pedal edema         x   Abd fullness         x   N&V         x   Diaphoresis         x When:  Bleeding         x   Urine          Light yellow urine  SOB         x Activity:  Palpitations         x When:  ICD shock         x   Hospitlizaitons         x When/where/why:  ED visit         x When/where/why:  Other MD         x When/who/why:  Activity           Diminished activity last week - pt has felt "tired"  Fluid        "drink water"  Diet    Low salt   Past Medical History  Diagnosis Date  . AICD (automatic cardioverter/defibrillator) present   . Hypertension   . Congestive heart failure   . Pneumonia   . Bronchitis     Current Outpatient Prescriptions  Medication Sig Dispense Refill  . allopurinol (ZYLOPRIM) 300 MG tablet Take 300 mg by mouth every other day. Pt taking med every other day to save on cost medicine      . amLODipine (NORVASC) 10 MG tablet Take 1 tablet (10 mg total) by mouth  daily.  30 tablet  3  . aspirin EC 325 MG tablet Take 1 tablet (325 mg total) by mouth daily.  30 tablet  0  . hydrALAZINE (APRESOLINE) 50 MG tablet Take 1 tablet (50 mg total) by mouth 3 (three) times daily.  90 tablet  3  . lisinopril (PRINIVIL,ZESTRIL) 40 MG tablet Take 40 mg by mouth daily. Pt stopped taking Friday or Saturday of last week due to dizziness      . PACERONE 200 MG tablet TAKE TWO  TABLETS BY MOUTH ONCE DAILY  60 tablet  3  . pantoprazole (PROTONIX) 40 MG tablet Take 1 tablet (40 mg total) by mouth daily.  30 tablet  3  . potassium chloride SA (K-DUR,KLOR-CON) 20 MEQ tablet Take 1 tablet (20 mEq total) by mouth as needed.  30 tablet  3  . warfarin (COUMADIN) 5 MG tablet Take 7.5 mg (1 1/2 tablets) on Tues, Thurs, Sat and then 5 mg on Mon, Wed, Fri, and Sunday  45 tablet  3  . amoxicillin (AMOXIL) 500 MG capsule Take 4 tabs 1 hour prior to dental work  4 capsule  3  . lisinopril (PRINIVIL,ZESTRIL) 20 MG tablet Take 1 tablet (20 mg total) by mouth daily.  30 tablet  3   No current facility-administered medications for this encounter.   Imdur and Metoprolol  REVIEW OF SYSTEMS: All systems negative except as listed in HPI, PMH and Problem list.  LVAD INTERROGATION:  HeartMate II LVAD:  Flow 5.6 liters/min, speed 9990, power 6.5, PI 5.3. Set speed 10,000 low speed: 9400  I reviewed the LVAD parameters from today, and compared the results to the patient's prior recorded data.  No programming changes were made.  The LVAD is functioning within specified parameters.  The patient performs LVAD self-test daily.  LVAD interrogation was negative for any significant power changes or alarms. He had numerous PI events. LVAD equipment check completed and is in good working order.  Back-up equipment present.   LVAD education done on emergency procedures and precautions and reviewed exit site care.   Physical Exam: Filed Vitals:   11/06/12 1505  BP: 98/0  Pulse: 80  Height: 6\' 2"  (1.88 m)   Weight: 227 lb (102.967 kg)  SpO2: 99%  Last weight: 228 lbs  GENERAL: Well appearing, male who presents to clinic today in no acute distress. HEENT: normal  NECK: Supple, JVP flat .  2+ bilaterally, no bruits. No lymphadenopathy or thyromegaly appreciated.   CARDIAC:  Mechanical heart sounds with LVAD hum present.  LUNGS:  Clear to auscultation bilaterally.  ABDOMEN:  Soft, round, nontender, positive bowel sounds x4.     LVAD exit site: well-healed and incorporated.  Dressing dry and intact.  No erythema or drainage.  Stabilization device present and accurately applied.  Driveline dressing is being changed daily per sterile technique. EXTREMITIES:  Warm and dry, no cyanosis, clubbing, rash or edema  NEUROLOGIC:  Alert and oriented x 4.  Gait steady.  No aphasia.  No dysarthria.  Affect pleasant.     ASSESSMENT AND PLAN:   1) Chronic systolic HF, s/p HMII LVAD implant 05/2012 - Robert [name] is currently on the 1b list at [facility] for transplant. - NYHA II and appears to be hypovolemic. Will stop lasix and instructed patient with increased activity and working outside to make sure he is taking in enough fluids. - Will f/u in 1 month unless his s/s do not improve.  2) LVAD - Multiple PI events thought to be d/t hypovolemia. Stop lasix. - MAP still elevated 98 with patient taking self off of lisinopril. Will restart lisinopril at half the dose 20 mg daily.   Ulla Potash B NP-C 8:31 PM  Patient seen and examined with Ulla Potash, NP. We discussed all aspects of the encounter. I agree with the assessment and plan as stated above.  Overall doing well but appears dry on exam and having multiple PI events on VAD. Stop lasix and use only PRN. Agree with gentle titration  of lisinopril for HTN. Now on Tx list IB. Driveline site looks good.   Daniel Bensimhon,MD 10:52 PM

## 2012-11-06 NOTE — Telephone Encounter (Signed)
Pt called c/o of "not feeling well"; states he is having episodes of dizziness and lightheadedness, denies syncope. Denies VAD alarms or return of heart failure symptoms. Pt wants to come by clinic for "BP check".  Pt did increase his hydralazine to 50 mg tid as instructed last clinic visit on 10/17/12, but states he stopped his lisinopril 40 mg daily "Friday or Saturday" because he wasn't feeling well. Instructed pt to come to clinic today for BP and VAD parameter check; pt agrees to do so.

## 2012-11-06 NOTE — Patient Instructions (Addendum)
1.  Stop Furosemide and potassium supplement  2.  Re-start Lisinopril, but decrease dose to 20 mg daily  3.  Increase salt and fluid intake  4.  If symptoms continue or worsen, call VAD clinic

## 2012-11-15 ENCOUNTER — Ambulatory Visit (INDEPENDENT_AMBULATORY_CARE_PROVIDER_SITE_OTHER): Payer: BC Managed Care – PPO | Admitting: *Deleted

## 2012-11-15 ENCOUNTER — Ambulatory Visit (HOSPITAL_COMMUNITY): Payer: Self-pay | Admitting: Anesthesiology

## 2012-11-15 ENCOUNTER — Telehealth: Payer: Self-pay | Admitting: *Deleted

## 2012-11-15 ENCOUNTER — Encounter: Payer: Self-pay | Admitting: *Deleted

## 2012-11-15 ENCOUNTER — Other Ambulatory Visit: Payer: Self-pay | Admitting: *Deleted

## 2012-11-15 DIAGNOSIS — I5022 Chronic systolic (congestive) heart failure: Secondary | ICD-10-CM

## 2012-11-15 DIAGNOSIS — Z95811 Presence of heart assist device: Secondary | ICD-10-CM

## 2012-11-15 DIAGNOSIS — Z7901 Long term (current) use of anticoagulants: Secondary | ICD-10-CM

## 2012-11-15 DIAGNOSIS — I428 Other cardiomyopathies: Secondary | ICD-10-CM

## 2012-11-15 DIAGNOSIS — I272 Pulmonary hypertension, unspecified: Secondary | ICD-10-CM

## 2012-11-15 DIAGNOSIS — I2789 Other specified pulmonary heart diseases: Secondary | ICD-10-CM

## 2012-11-15 LAB — REMOTE ICD DEVICE
AL IMPEDENCE ICD: 494 Ohm
BAMS-0001: 171 {beats}/min
BATTERY VOLTAGE: 3.02 V
DEV-0020ICD: NEGATIVE
FVT: 0
MODE SWITCH EPISODES: 0
PACEART VT: 0
RV LEAD AMPLITUDE: 11.1 mv
TZAT-0001SLOWVT: 2
TZAT-0013SLOWVT: 3
TZAT-0018SLOWVT: NEGATIVE
TZON-0003AFLUTTER: 350.8 ms
TZON-0003ATACH: 350.8 ms
TZON-0003SLOWVT: 370.3 ms
TZST-0001SLOWVT: 3
TZST-0001SLOWVT: 4
TZST-0001SLOWVT: 5
VENTRICULAR PACING ICD: 0.1 pct
VF: 0

## 2012-11-15 NOTE — Telephone Encounter (Signed)
Called pt per Leota Sauers PharmD, based on INR today of 4.1 - pt is to hold dose today; then take coumadin 5 mg daily except 7.5 mg on MWF. Pt will need re-check INR in one week at [facility]. Pt verbalized understanding of same.

## 2012-11-19 ENCOUNTER — Other Ambulatory Visit (HOSPITAL_COMMUNITY): Payer: Self-pay | Admitting: Anesthesiology

## 2012-11-21 ENCOUNTER — Other Ambulatory Visit: Payer: Self-pay | Admitting: *Deleted

## 2012-11-21 ENCOUNTER — Other Ambulatory Visit (HOSPITAL_COMMUNITY): Payer: Self-pay | Admitting: *Deleted

## 2012-11-21 DIAGNOSIS — Z95811 Presence of heart assist device: Secondary | ICD-10-CM

## 2012-11-21 DIAGNOSIS — Z7901 Long term (current) use of anticoagulants: Secondary | ICD-10-CM

## 2012-11-22 ENCOUNTER — Other Ambulatory Visit: Payer: Self-pay | Admitting: *Deleted

## 2012-11-22 ENCOUNTER — Ambulatory Visit (HOSPITAL_COMMUNITY)
Admission: RE | Admit: 2012-11-22 | Discharge: 2012-11-22 | Disposition: A | Discharge status: Home / Self Care | Payer: BC Managed Care – PPO | Source: Ambulatory Visit | Attending: Internal Medicine | Admitting: Internal Medicine

## 2012-11-22 ENCOUNTER — Ambulatory Visit: Payer: Self-pay | Admitting: *Deleted

## 2012-11-22 VITALS — BP 84/0 | HR 90 | Wt 226.1 lb

## 2012-11-22 DIAGNOSIS — I10 Essential (primary) hypertension: Secondary | ICD-10-CM

## 2012-11-22 DIAGNOSIS — Z7901 Long term (current) use of anticoagulants: Secondary | ICD-10-CM

## 2012-11-22 DIAGNOSIS — I272 Pulmonary hypertension, unspecified: Secondary | ICD-10-CM

## 2012-11-22 DIAGNOSIS — I428 Other cardiomyopathies: Secondary | ICD-10-CM | POA: Insufficient documentation

## 2012-11-22 DIAGNOSIS — Z95818 Presence of other cardiac implants and grafts: Secondary | ICD-10-CM

## 2012-11-22 DIAGNOSIS — Z95811 Presence of heart assist device: Secondary | ICD-10-CM

## 2012-11-22 DIAGNOSIS — I5022 Chronic systolic (congestive) heart failure: Secondary | ICD-10-CM | POA: Insufficient documentation

## 2012-11-22 LAB — BASIC METABOLIC PANEL
CO2: 27 mEq/L (ref 19–32)
Chloride: 103 mEq/L (ref 96–112)
GFR calc non Af Amer: 59 mL/min — ABNORMAL LOW (ref >90)
Glucose, Bld: 90 mg/dL (ref 70–99)
Potassium: 4 mEq/L (ref 3.5–5.1)
Sodium: 139 mEq/L (ref 135–145)

## 2012-11-22 LAB — CBC
Hemoglobin: 14.7 g/dL (ref 13.0–17.0)
RBC: 5.2 MIL/uL (ref 4.22–5.81)
WBC: 7.2 10*3/uL (ref 4.0–10.5)

## 2012-11-22 LAB — PROTIME-INR
INR: 2.64 — ABNORMAL HIGH (ref 0.00–1.49)
Prothrombin Time: 27.3 seconds — ABNORMAL HIGH (ref 11.6–15.2)

## 2012-11-22 MED ORDER — AMLODIPINE BESYLATE 10 MG PO TABS: ORAL_TABLET | ORAL | Status: DC

## 2012-11-22 NOTE — Patient Instructions (Addendum)
1.  Lisinopril 40 mg in the morning 2.  Norvasc 5 mg in the evening 3.  Call us if you feel dizzy before making any med changes - we will need to re-check blood pressure  4.  Return to VAD clinic in one month

## 2012-11-22 NOTE — Progress Notes (Signed)
HPI: Mr. 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 status post dual chamber Medtronic ICD implanted April 2011. Cath 2011 showed normal coronaries. He also has a history of VTach. Currently listed on 1b list for tx.   Follow up: Pt called clinic c/o occasional dizziness when bending over then standing up. He stopped taking his hydralazine 11/08/12 and admits to not taking his Norvasc at all. He is now taking Lisinopril 40 mg in the morning and he added 20 mg in the evening.   Denies LVAD alarms.  Denies driveline trauma, erythema or drainage.  Denies ICD shocks.   Reports taking Coumadin as prescribed and adherence to anticoagulation based dietary restrictions.  Denies bright red blood per rectum or melena, no dark urine or hematuria.     Symptom Yes No Details  Angina         x Activity:  Claudication         x How far:  Syncope         x When:      Dizzy with orthostatic changes occasionally  Stroke         x   Orthopnea         x How many pillows:  one  PND         x How often:  CPAP         x How many hrs:  Pedal edema         x   Abd fullness         x   N&V         x                        Fair appetite  Diaphoresis         x When:   Chill and sweating x 2 days last week   Bleeding         x   Urine          Light yellow urine  SOB         x Activity:  Palpitations         x When:  ICD shock         x   Hospitlizaitons         x When/where/why:  ED visit         x When/where/why:  Other MD         x When/who/why:   Activity            No limitations if "take it slow"  Fluid         No limitations  Diet     No limitations   Past Medical History  Diagnosis Date  . AICD (automatic cardioverter/defibrillator) present   . Hypertension   . Congestive heart failure   . Pneumonia   . Bronchitis     Current Outpatient Prescriptions  Medication Sig Dispense  Refill  . aspirin EC 325 MG tablet Take 1 tablet (325 mg total) by mouth daily.  30 tablet  0  . lisinopril (PRINIVIL,ZESTRIL) 40 MG tablet Take 40 mg by mouth daily. Pt stopped taking Friday or Saturday of last week due to dizziness      . PACERONE 200 MG tablet TAKE TWO TABLETS BY MOUTH ONCE DAILY  60  tablet  3  . pantoprazole (PROTONIX) 40 MG tablet Take 1 tablet (40 mg total) by mouth daily.  30 tablet  3  . potassium chloride SA (K-DUR,KLOR-CON) 20 MEQ tablet Take 1 tablet (20 mEq total) by mouth as needed.  30 tablet  3  . warfarin (COUMADIN) 5 MG tablet TAKE 7.5 MG (1 1/2 TABLETS) ON TUES, THUR, SAT AND THEN 5MG  ON MON, WED, FRI AND SUNDAY  45 tablet  3  . allopurinol (ZYLOPRIM) 300 MG tablet TAKE ONE TABLET BY MOUTH ONCE DAILY  30 tablet  6  . amLODipine (NORVASC) 10 MG tablet Take 1 tablet (10 mg total) by mouth daily.  30 tablet  3  . amoxicillin (AMOXIL) 500 MG capsule Take 4 tabs 1 hour prior to dental work  4 capsule  3  . hydrALAZINE (APRESOLINE) 50 MG tablet Take 1 tablet (50 mg total) by mouth 3 (three) times daily.  90 tablet  3  . lisinopril (PRINIVIL,ZESTRIL) 20 MG tablet Take 1 tablet (20 mg total) by mouth daily.  30 tablet  3   No current facility-administered medications for this encounter.   Imdur and Metoprolol  REVIEW OF SYSTEMS: All systems negative except as listed in HPI, PMH and Problem list.  LVAD INTERROGATION:  HeartMate II LVAD:  Flow ---, speed 9980, power 6.3, PI 6.6. Set speed 10,000 low speed: 9400  I reviewed the LVAD parameters from today, and compared the results to the patient's prior recorded data.  No programming changes were made.  The LVAD is functioning within specified parameters.  The patient performs LVAD self-test daily.  LVAD interrogation was negative for any significant power changes or alarms. He had numerous PI events. LVAD equipment check completed and is in good working order.  Back-up equipment present.   LVAD education done on  emergency procedures and precautions and reviewed exit site care.   Physical Exam: Filed Vitals:   11/22/12 1135  BP: 84/0  Pulse: 90  Weight: 226 lb 1.6 oz (102.558 kg)  SpO2: 96%  Last weight: 228 lbs  GENERAL: Well appearing, male who presents to clinic today in no acute distress. HEENT: normal  NECK: Supple, JVP flat .  2+ bilaterally, no bruits. No lymphadenopathy or thyromegaly appreciated.   CARDIAC:  Mechanical heart sounds with LVAD hum present.  LUNGS:  Clear to auscultation bilaterally.  ABDOMEN:  Soft, round, nontender, positive bowel sounds x4.     LVAD exit site: Dressing dry and intact.  No erythema or drainage.  Stabilization device present and accurately applied.  Driveline dressing is being changed q 3 days due to moisture under the sorbaview dressing.  EXTREMITIES:  Warm and dry, no cyanosis, clubbing, rash or edema  NEUROLOGIC:  Alert and oriented x 4.  Gait steady.  No aphasia.  No dysarthria.  Affect pleasant.     ASSESSMENT AND PLAN:   1) Chronic systolic HF, s/p HMII LVAD implant 05/2012 - Mr. Nocera is currently on the 1b list at [facility] for transplant. - NYHA II and appears to be hypovolemic. Will stop lasix and instructed patient with increased activity and working outside to make sure he is taking in enough fluids. - Will f/u in 1 month unless his s/s do not improve.  2) LVAD - Multiple PI events  - MAP 84 today; pt has stopped norvasc and hydralazine and is taking Lisinopril 40 mg am and 20 mg pm. Instructed pt to continue Lisinopril 40 mg am and start Norvasc 5 mg  pm.    Hessie Diener NP-C 11:47 AM  Patient seen and examined with Ulla Potash, NP and Hessie Diener, RN. We discussed all aspects of the encounter. I agree with the assessment and plan as stated above.  Overall doing well but appears dry on exam and having multiple PI events on VAD. Stop lasix and use only PRN. Agree with change in anti-HTN regimen as above. Now on Tx list IB. Driveline site  looks good.   Misao Fackrell,MD 11:22 PM

## 2012-11-23 ENCOUNTER — Other Ambulatory Visit (HOSPITAL_COMMUNITY): Payer: Self-pay | Admitting: *Deleted

## 2012-11-23 MED ORDER — ALLOPURINOL 300 MG PO TABS: ORAL_TABLET | ORAL | Status: DC

## 2012-11-23 MED ORDER — AMLODIPINE BESYLATE 10 MG PO TABS: ORAL_TABLET | ORAL | Status: DC

## 2012-12-06 ENCOUNTER — Ambulatory Visit (HOSPITAL_COMMUNITY)
Admission: RE | Admit: 2012-12-06 | Discharge: 2012-12-06 | Disposition: A | Discharge status: Home / Self Care | Payer: BC Managed Care – PPO | Source: Ambulatory Visit | Attending: Internal Medicine | Admitting: Internal Medicine

## 2012-12-06 ENCOUNTER — Other Ambulatory Visit (INDEPENDENT_AMBULATORY_CARE_PROVIDER_SITE_OTHER): Payer: BC Managed Care – PPO

## 2012-12-06 ENCOUNTER — Ambulatory Visit (HOSPITAL_COMMUNITY): Payer: Self-pay | Admitting: *Deleted

## 2012-12-06 ENCOUNTER — Telehealth: Payer: Self-pay | Admitting: Nurse Practitioner

## 2012-12-06 VITALS — BP 102/0 | HR 80 | Wt 225.8 lb

## 2012-12-06 DIAGNOSIS — I5022 Chronic systolic (congestive) heart failure: Secondary | ICD-10-CM

## 2012-12-06 DIAGNOSIS — Z95811 Presence of heart assist device: Secondary | ICD-10-CM

## 2012-12-06 DIAGNOSIS — I272 Pulmonary hypertension, unspecified: Secondary | ICD-10-CM

## 2012-12-06 DIAGNOSIS — I2789 Other specified pulmonary heart diseases: Secondary | ICD-10-CM | POA: Insufficient documentation

## 2012-12-06 DIAGNOSIS — Z7901 Long term (current) use of anticoagulants: Secondary | ICD-10-CM

## 2012-12-06 DIAGNOSIS — I428 Other cardiomyopathies: Secondary | ICD-10-CM

## 2012-12-06 DIAGNOSIS — Z006 Encounter for examination for normal comparison and control in clinical research program: Secondary | ICD-10-CM | POA: Insufficient documentation

## 2012-12-06 DIAGNOSIS — Z95818 Presence of other cardiac implants and grafts: Secondary | ICD-10-CM

## 2012-12-06 LAB — PROTIME-INR
INR: 5.4 ratio (ref 0.8–1.0)
Prothrombin Time: 54.9 s (ref 10.2–12.4)

## 2012-12-06 MED ORDER — AMLODIPINE BESYLATE 10 MG PO TABS: 10.0000 mg | ORAL_TABLET | Freq: Every day | ORAL | Status: DC

## 2012-12-06 NOTE — Telephone Encounter (Signed)
Received call from [facility] lab with critical INR 5.4 and PT 54.9 on patient.  Chantel. MA in [facility] notified.

## 2012-12-06 NOTE — Progress Notes (Signed)
Pt in for BP check - MAP 102. Pt denies any further episodes of lightheadedness or dizziness. Pt has been taking Lisinopril 40 mg every am and Norvasc 5 mg every evening as directed.  Pt has been experiencing multiple PI events predominately in the afternoons after being out in the heat working.   VAD parameters: Flow:  6.4 Speed:  9990 PI: 5.1 Power: 7.4 Alarms:  Low voltage advisories (@ 10:00 pm in evenings) Events:  Multiple PI events with PI drops 2.5 - 3.0 (afternoons after patient has been working outside in heat Fixed speed: 10,000 Low speed limit: 9400  Dr. Gala Romney updated - instructed pt to increase Norvasc to 10 mg every evening; and to increase fluid and salt intake when working outdoors. Pt will return in two weeks for BP check.   Pt verbalized understanding of same.

## 2012-12-07 ENCOUNTER — Other Ambulatory Visit: Payer: Self-pay | Admitting: *Deleted

## 2012-12-07 DIAGNOSIS — Z7901 Long term (current) use of anticoagulants: Secondary | ICD-10-CM

## 2012-12-07 DIAGNOSIS — Z95811 Presence of heart assist device: Secondary | ICD-10-CM

## 2012-12-07 NOTE — Patient Instructions (Signed)
Hold dose Wednesday 12/06/12; 2.5 mg on Thursday 12/07/12; 5 mg Friday 12/08/12. Then take 5 mg daily except 7.5 on Monday/Friday.  Will re-check INR Monday 12/11/12.

## 2012-12-10 ENCOUNTER — Other Ambulatory Visit: Payer: Self-pay | Admitting: Cardiovascular Disease

## 2012-12-10 LAB — ICD DEVICE OBSERVATION

## 2012-12-11 ENCOUNTER — Ambulatory Visit (HOSPITAL_COMMUNITY)
Admission: RE | Admit: 2012-12-11 | Discharge: 2012-12-11 | Disposition: A | Discharge status: Home / Self Care | Payer: BC Managed Care – PPO | Source: Ambulatory Visit | Attending: Internal Medicine | Admitting: Internal Medicine

## 2012-12-11 ENCOUNTER — Other Ambulatory Visit: Payer: BC Managed Care – PPO

## 2012-12-11 ENCOUNTER — Ambulatory Visit (HOSPITAL_COMMUNITY): Payer: Self-pay | Admitting: *Deleted

## 2012-12-11 ENCOUNTER — Other Ambulatory Visit (INDEPENDENT_AMBULATORY_CARE_PROVIDER_SITE_OTHER): Payer: BC Managed Care – PPO

## 2012-12-11 ENCOUNTER — Other Ambulatory Visit: Payer: Self-pay | Admitting: *Deleted

## 2012-12-11 VITALS — BP 112/0 | HR 72 | Ht 74.0 in | Wt 227.0 lb

## 2012-12-11 DIAGNOSIS — Z7901 Long term (current) use of anticoagulants: Secondary | ICD-10-CM

## 2012-12-11 DIAGNOSIS — Z95811 Presence of heart assist device: Secondary | ICD-10-CM

## 2012-12-11 DIAGNOSIS — I10 Essential (primary) hypertension: Secondary | ICD-10-CM

## 2012-12-11 DIAGNOSIS — Z7982 Long term (current) use of aspirin: Secondary | ICD-10-CM | POA: Insufficient documentation

## 2012-12-11 DIAGNOSIS — Z95818 Presence of other cardiac implants and grafts: Secondary | ICD-10-CM

## 2012-12-11 DIAGNOSIS — I428 Other cardiomyopathies: Secondary | ICD-10-CM

## 2012-12-11 DIAGNOSIS — Z79899 Other long term (current) drug therapy: Secondary | ICD-10-CM | POA: Insufficient documentation

## 2012-12-11 DIAGNOSIS — I2789 Other specified pulmonary heart diseases: Secondary | ICD-10-CM

## 2012-12-11 DIAGNOSIS — Z95810 Presence of automatic (implantable) cardiac defibrillator: Secondary | ICD-10-CM | POA: Insufficient documentation

## 2012-12-11 DIAGNOSIS — I5022 Chronic systolic (congestive) heart failure: Secondary | ICD-10-CM

## 2012-12-11 DIAGNOSIS — I5023 Acute on chronic systolic (congestive) heart failure: Secondary | ICD-10-CM

## 2012-12-11 DIAGNOSIS — I509 Heart failure, unspecified: Secondary | ICD-10-CM

## 2012-12-11 DIAGNOSIS — I359 Nonrheumatic aortic valve disorder, unspecified: Secondary | ICD-10-CM

## 2012-12-11 DIAGNOSIS — I272 Pulmonary hypertension, unspecified: Secondary | ICD-10-CM

## 2012-12-11 LAB — PROTIME-INR: Prothrombin Time: 26.4 s — ABNORMAL HIGH (ref 10.2–12.4)

## 2012-12-11 MED ORDER — HYDRALAZINE HCL 25 MG PO TABS: ORAL_TABLET | ORAL | Status: DC

## 2012-12-11 NOTE — Patient Instructions (Addendum)
1.  Start Hydralazine 12.5 mg three times daily 2.  Ramp study tomorrow as scheduled 3.  Get BP check next week

## 2012-12-11 NOTE — Progress Notes (Addendum)
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 status post dual chamber Medtronic ICD implanted April 2011. Cath 2011 showed normal coronaries. He also has a history of VTach. Currently listed on 1b list for tx.   Pt returned today for BP check. MAP BP 112 right arm, 114 left arm.  Pt c/o having "spell" Saturday night where he felt so dizzy he had to lie down for relief. Pt denies syncopal episodes, VAD alarms, of CHF symptoms. Remains very active. Working hard in heat. Has liberalized salt and fluid intake.   VAD interrogation revealed:  Flow:  5.1 RPM:  9990 PI:  6.3 Power:  6.6 Alarms:  Few low voltage advisories Events:  Multiple PI events   ICD interrogated personally with Optivol. Volume back to baseline. NoVT or AF.   Ramp study performed today with results below:   Speed    Flow     PI   Power   LVIDD      AI   Aortic openings     MR      TR  Septum        RV  10000 5.2  7.1     6.8  6.81 trivial 0/5 mild Mild-mod midline Mildly depressed  9800 5.3 7.5 6.6 6.87 mild 0/5 trivial Mild- mod Midline  mild  10,200  --- 7.8 7.2 6.79 mild 0/5 trivial Mild-mod Midline  mild  10,400  5.4 7.1 7.0 6.73 mild 0/5 trivial Mild-mod Midline mild                                                   Ramp ECHO performed in echo lab.   At completion of ramp study, patients primary and back up controller programmed:  Fixed speed  10,400   RPM  Low speed limit  9800    RPM   Will see patient back for BP check Friday 12/15/12. Pt verbalized understanding of above.   Past Medical History  Diagnosis Date  . AICD (automatic cardioverter/defibrillator) present   . Hypertension   . Congestive heart failure   . Pneumonia   . Bronchitis     Current Outpatient Prescriptions  Medication Sig Dispense Refill  . allopurinol (ZYLOPRIM) 300 MG tablet TAKE ONE TABLET BY MOUTH  ONCE DAILY  30 tablet  6  . amLODipine (NORVASC) 10 MG tablet Take 1 tablet (10 mg total) by mouth daily.  30 tablet  6  . amoxicillin (AMOXIL) 500 MG capsule Take 4 tabs 1 hour prior to dental work  4 capsule  3  . aspirin EC 325 MG tablet Take 1 tablet (325 mg total) by mouth daily.  30 tablet  0  . hydrALAZINE (APRESOLINE) 25 MG tablet Take 1/2 tab three times daily or as directed  60 tablet  3  . lisinopril (PRINIVIL,ZESTRIL) 40 MG tablet Take 40 mg by mouth daily. Pt stopped taking Friday or Saturday of last week due to dizziness      . PACERONE 200 MG tablet TAKE TWO TABLETS BY MOUTH ONCE DAILY  60 tablet  3  . pantoprazole (PROTONIX) 40 MG tablet Take 1 tablet (40 mg total) by mouth daily.  30 tablet  3  .  potassium chloride SA (K-DUR,KLOR-CON) 20 MEQ tablet Take 1 tablet (20 mEq total) by mouth as needed.  30 tablet  3  . warfarin (COUMADIN) 5 MG tablet TAKE 7.5 MG (1 1/2 TABLETS) ON TUES, THUR, SAT AND THEN 5MG  ON MON, WED, FRI AND SUNDAY  45 tablet  3   No current facility-administered medications for this encounter.   Imdur and Metoprolol  REVIEW OF SYSTEMS: All systems negative except as listed in HPI, PMH and Problem list.  LVAD INTERROGATION:  HeartMate II LVAD:  Flow ---, speed 9980, power 6.3, PI 6.6. Set speed 10,000 low speed: 9400  I reviewed the LVAD parameters from today, and compared the results to the patient's prior recorded data.  No programming changes were made.  The LVAD is functioning within specified parameters.  The patient performs LVAD self-test daily.  LVAD interrogation was negative for any significant power changes or alarms. He had numerous PI events. LVAD equipment check completed and is in good working order.  Back-up equipment present.   LVAD education done on emergency procedures and precautions and reviewed exit site care.   Physical Exam: Filed Vitals:   12/11/12 1353  BP: 112/0  Pulse: 72  Height: 6\' 2"  (1.88 m)  Weight: 227 lb (102.967 kg)   SpO2: 99%  Last weight: 228 lbs  GENERAL: Well appearing, male who presents to clinic today in no acute distress. HEENT: normal  NECK: Supple, JVP flat .  2+ bilaterally, no bruits. No lymphadenopathy or thyromegaly appreciated.   CARDIAC:  Mechanical heart sounds with LVAD hum present.  LUNGS:  Clear to auscultation bilaterally.  ABDOMEN:  Soft, round, nontender, positive bowel sounds x4.     LVAD exit site: Dressing dry and intact.  No erythema or drainage.  Stabilization device present and accurately applied.  Driveline dressing is being changed q 3 days due to moisture under the sorbaview dressing.  EXTREMITIES:  Warm and dry, no cyanosis, clubbing, rash or edema  NEUROLOGIC:  Alert and oriented x 4.  Gait steady.  No aphasia.  No dysarthria.  Affect pleasant.     ASSESSMENT AND PLAN:   1) Chronic systolic HF, s/p HMII LVAD implant 05/2012 - Robert [name] is currently on the 1b list at [facility] for transplant. - NYHA I  Doing well.  -Has appointment to return to [facility] this week  2) LVAD - Continues with multiple PI events and some dizziness. No arrhythmias to explain. I suspect he may be hypovolemic.However MAPs remain elevated. -Will continue to hold diuretics and encourage salt and fluid intake. -Ramp study done and VAD speed turned up to 10,400. Will follow closely and see back on Friday  3) HTN - Pt will re-start Hydralazine at reduced dose of 12.5 mg tid   Truman Hayward 2:26 PM

## 2012-12-11 NOTE — Progress Notes (Signed)
  Echocardiogram 2D Echocardiogram limited (RAMP) has been performed.  Cristiana Yochim 12/11/2012, 1:12 PM

## 2012-12-12 ENCOUNTER — Other Ambulatory Visit (HOSPITAL_COMMUNITY): Payer: Self-pay | Admitting: *Deleted

## 2012-12-12 DIAGNOSIS — Z7901 Long term (current) use of anticoagulants: Secondary | ICD-10-CM

## 2012-12-12 DIAGNOSIS — Z95811 Presence of heart assist device: Secondary | ICD-10-CM

## 2012-12-12 NOTE — Patient Instructions (Signed)
No change in coumadin dose

## 2012-12-15 ENCOUNTER — Ambulatory Visit (HOSPITAL_COMMUNITY)
Admission: RE | Admit: 2012-12-15 | Discharge: 2012-12-15 | Disposition: A | Discharge status: Home / Self Care | Payer: BC Managed Care – PPO | Source: Ambulatory Visit | Attending: Internal Medicine | Admitting: Internal Medicine

## 2012-12-15 ENCOUNTER — Other Ambulatory Visit (HOSPITAL_COMMUNITY): Payer: Self-pay | Admitting: *Deleted

## 2012-12-15 ENCOUNTER — Telehealth (HOSPITAL_COMMUNITY): Payer: Self-pay | Admitting: *Deleted

## 2012-12-15 VITALS — BP 112/0 | HR 70 | Ht 74.0 in | Wt 224.0 lb

## 2012-12-15 DIAGNOSIS — Z95818 Presence of other cardiac implants and grafts: Secondary | ICD-10-CM

## 2012-12-15 DIAGNOSIS — Z7901 Long term (current) use of anticoagulants: Secondary | ICD-10-CM

## 2012-12-15 DIAGNOSIS — Z95811 Presence of heart assist device: Secondary | ICD-10-CM

## 2012-12-15 DIAGNOSIS — I10 Essential (primary) hypertension: Secondary | ICD-10-CM

## 2012-12-15 MED ORDER — LISINOPRIL 40 MG PO TABS: ORAL_TABLET | ORAL | Status: DC

## 2012-12-15 NOTE — Telephone Encounter (Signed)
Opened in error

## 2012-12-15 NOTE — Patient Instructions (Addendum)
1.  Stop hydralazine. 2.  Change Norvasc to 10 mg in the morning; if dizziness increase, go back to taking in the evening. 3.  Increase Lisinopril to 40 mg in am and 20 mg in the evening. 4.  Increase fluid intake. 5.  Call VAD pager or VAD coordinator at [phone] if needed. 6.  INR Monday 12/18/12. 7.  INR and lab Monday 12/25/12. 8.  Start over the counter claritin

## 2012-12-15 NOTE — Progress Notes (Signed)
Pt in for BP check; MAP BP remains elevated at 112; pt states he feels "more" fatigued since last visit and c/o "slight headache".  Pt denies any syncope, but still has the dizziness when bending over.   VAD parameters: Flow:  --- Speed:  10,400 RPM PI:  6.0 Power:  6.4 Events: multiple PI events Alarms:  Low voltage advisory  Discussed with Ulla Potash, NP - instructed pt to stop hydralazine, begin taking his daily dose of Norvasc in the morning, and increase his Lisinopril to 40 mg am and 20 mg pm.  Also instructed pt to increase his fluid and salt intake. Pt is to call if symptoms do not improve or worsen.  Pt is to have INR check Monday 12/18/12; then again with BMP on 12/25/12. Reviewed AVS with pt and all questions answered.

## 2012-12-18 ENCOUNTER — Ambulatory Visit (INDEPENDENT_AMBULATORY_CARE_PROVIDER_SITE_OTHER): Payer: BC Managed Care – PPO | Admitting: Cardiology

## 2012-12-18 DIAGNOSIS — Z7901 Long term (current) use of anticoagulants: Secondary | ICD-10-CM

## 2012-12-18 DIAGNOSIS — Z95811 Presence of heart assist device: Secondary | ICD-10-CM

## 2012-12-18 DIAGNOSIS — Z95818 Presence of other cardiac implants and grafts: Secondary | ICD-10-CM

## 2012-12-18 DIAGNOSIS — I10 Essential (primary) hypertension: Secondary | ICD-10-CM

## 2012-12-18 LAB — BASIC METABOLIC PANEL
BUN: 15 mg/dL (ref 6–23)
Calcium: 8.8 mg/dL (ref 8.4–10.5)
Chloride: 102 mEq/L (ref 96–112)
Creatinine, Ser: 1.2 mg/dL (ref 0.4–1.5)

## 2012-12-18 LAB — PROTIME-INR: Prothrombin Time: 25.2 s — ABNORMAL HIGH (ref 10.2–12.4)

## 2012-12-19 ENCOUNTER — Other Ambulatory Visit (HOSPITAL_COMMUNITY): Payer: Self-pay | Admitting: Anesthesiology

## 2012-12-19 ENCOUNTER — Encounter: Payer: Self-pay | Admitting: *Deleted

## 2012-12-19 ENCOUNTER — Ambulatory Visit (HOSPITAL_COMMUNITY): Payer: Self-pay | Admitting: Cardiology

## 2012-12-19 DIAGNOSIS — Z95811 Presence of heart assist device: Secondary | ICD-10-CM

## 2012-12-19 LAB — REMOTE ICD DEVICE
AL AMPLITUDE: 1.9 mv
AL IMPEDENCE ICD: 475 Ohm
BATTERY VOLTAGE: 3.02 V
CHARGE TIME: 10 s
MODE SWITCH EPISODES: 0
RV LEAD AMPLITUDE: 9.6 mv
TZAT-0013SLOWVT: 3
TZAT-0018SLOWVT: NEGATIVE
TZST-0001SLOWVT: 3
TZST-0001SLOWVT: 4
TZST-0001SLOWVT: 6
TZST-0003SLOWVT: 35 J
VENTRICULAR PACING ICD: 0.1 pct

## 2012-12-21 ENCOUNTER — Other Ambulatory Visit: Payer: Self-pay | Admitting: *Deleted

## 2012-12-21 DIAGNOSIS — Z95811 Presence of heart assist device: Secondary | ICD-10-CM

## 2012-12-21 DIAGNOSIS — Z7901 Long term (current) use of anticoagulants: Secondary | ICD-10-CM

## 2012-12-22 ENCOUNTER — Other Ambulatory Visit (HOSPITAL_COMMUNITY): Payer: Self-pay | Admitting: *Deleted

## 2012-12-22 DIAGNOSIS — Z95811 Presence of heart assist device: Secondary | ICD-10-CM

## 2012-12-22 DIAGNOSIS — Z7901 Long term (current) use of anticoagulants: Secondary | ICD-10-CM

## 2012-12-22 DIAGNOSIS — I509 Heart failure, unspecified: Secondary | ICD-10-CM

## 2012-12-25 ENCOUNTER — Other Ambulatory Visit: Payer: BC Managed Care – PPO

## 2012-12-27 ENCOUNTER — Ambulatory Visit (HOSPITAL_COMMUNITY)
Admission: RE | Admit: 2012-12-27 | Discharge: 2012-12-27 | Disposition: A | Discharge status: Home / Self Care | Payer: BC Managed Care – PPO | Source: Ambulatory Visit | Attending: Internal Medicine | Admitting: Internal Medicine

## 2012-12-27 ENCOUNTER — Ambulatory Visit (HOSPITAL_COMMUNITY): Payer: Self-pay | Admitting: *Deleted

## 2012-12-27 VITALS — BP 100/0 | HR 85 | Wt 227.5 lb

## 2012-12-27 DIAGNOSIS — I472 Ventricular tachycardia, unspecified: Secondary | ICD-10-CM | POA: Insufficient documentation

## 2012-12-27 DIAGNOSIS — Z95818 Presence of other cardiac implants and grafts: Secondary | ICD-10-CM

## 2012-12-27 DIAGNOSIS — I5023 Acute on chronic systolic (congestive) heart failure: Secondary | ICD-10-CM | POA: Insufficient documentation

## 2012-12-27 DIAGNOSIS — Z95811 Presence of heart assist device: Secondary | ICD-10-CM

## 2012-12-27 DIAGNOSIS — I10 Essential (primary) hypertension: Secondary | ICD-10-CM

## 2012-12-27 DIAGNOSIS — I4729 Other ventricular tachycardia: Secondary | ICD-10-CM | POA: Insufficient documentation

## 2012-12-27 DIAGNOSIS — I5022 Chronic systolic (congestive) heart failure: Secondary | ICD-10-CM

## 2012-12-27 DIAGNOSIS — Z950 Presence of cardiac pacemaker: Secondary | ICD-10-CM | POA: Insufficient documentation

## 2012-12-27 DIAGNOSIS — I428 Other cardiomyopathies: Secondary | ICD-10-CM

## 2012-12-27 LAB — CBC
Hemoglobin: 13.7 g/dL (ref 13.0–17.0)
MCHC: 32.8 g/dL (ref 30.0–36.0)
Platelets: 166 10*3/uL (ref 150–400)
RBC: 4.91 MIL/uL (ref 4.22–5.81)

## 2012-12-27 LAB — BASIC METABOLIC PANEL
GFR calc Af Amer: 75 mL/min — ABNORMAL LOW (ref >90)
GFR calc non Af Amer: 65 mL/min — ABNORMAL LOW (ref >90)
Glucose, Bld: 73 mg/dL (ref 70–99)
Potassium: 4.3 mEq/L (ref 3.5–5.1)
Sodium: 138 mEq/L (ref 135–145)

## 2012-12-27 LAB — PROTIME-INR
INR: 3.32 — ABNORMAL HIGH (ref 0.00–1.49)
Prothrombin Time: 32.5 seconds — ABNORMAL HIGH (ref 11.6–15.2)

## 2012-12-27 MED ORDER — HYDRALAZINE HCL 25 MG PO TABS: 25.0000 mg | ORAL_TABLET | Freq: Three times a day (TID) | ORAL | Status: DC

## 2012-12-27 NOTE — Progress Notes (Signed)
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 status post dual chamber Medtronic ICD implanted April 2011. Cath 2011 showed normal coronaries. He also has a history of VTach. Currently listed on 1b list for tx.   Follow up: Last visit he had RAMP ECHO and speed increased from 10000 to 10400. He was also dry and lasix was stopped all together and encouraged to drink more fluids. He was also started on hydralazine 12.5 mg TID, however had RN visit and had complaints of dizziness, headache and numerous PI events so hydralazine stopped and lisinopril changed to 40/20. He presents today with complaints of occasional dizziness when bending over then standing up. He is now taking Lisinopril 40 mg in the morning and 20 mg in the evening, Norvasc 10 mg am, and hydralazine 12.5 three times daily (did not stop from previous visit). He has been drinking more than 2 L a day. Denies SOB, orthopnea, or CP. Reports that when he bends over that he feels like his pump is jumping up and down or moving when he stands back up.   Denies LVAD alarms.  Denies driveline trauma, erythema or drainage.  Denies ICD shocks.  Pt is doing self dressing changes q 3 days.  Dressing changed today using sorbaview dressing; exit site without erythema, tenderness, drainage, or foul odor. Drive line secured with attachment device.  Reports taking Coumadin as prescribed and adherence to anticoagulation based dietary restrictions.  Denies bright red blood per rectum or melena, no dark urine or hematuria.     Symptom Yes No Details  Angina         x Activity:  Claudication         x How far:  Syncope         x When:      Dizzy after bending over   Stroke         x   Orthopnea         x How many pillows:  one  PND         x How often:  CPAP         x How many hrs:  Pedal edema         x   Abd fullness         x   N&V          x                     good appetite; eating full meals  Diaphoresis         x When:     Bleeding         x   Urine          Light yellow urine  SOB         x Activity:  Palpitations         x When: feels his heart "beating very hard" at times when working outside and bending over  ICD shock         x   Hospitlizaitons         x When/where/why:  ED visit         x When/where/why:  Other MD         x When/who/why:   Activity  No limitations if take rest breaks  Fluid        increased to 1.5 - 2 gallons  Diet     No limitations   Past Medical History  Diagnosis Date  . AICD (automatic cardioverter/defibrillator) present   . Hypertension   . Congestive heart failure   . Pneumonia   . Bronchitis     Current Outpatient Prescriptions  Medication Sig Dispense Refill  . allopurinol (ZYLOPRIM) 300 MG tablet TAKE ONE TABLET BY MOUTH ONCE DAILY  30 tablet  6  . amLODipine (NORVASC) 10 MG tablet Take 1 tablet (10 mg total) by mouth daily.  30 tablet  6  . aspirin EC 325 MG tablet Take 1 tablet (325 mg total) by mouth daily.  30 tablet  0  . hydrALAZINE (APRESOLINE) 25 MG tablet Take 1/2 tab three times daily or as directed  60 tablet  3  . lisinopril (PRINIVIL,ZESTRIL) 40 MG tablet Take one tab (40 mg) in the morning and take 1/2 tab (20 mg) in the evening.  45 tablet  3  . PACERONE 200 MG tablet TAKE TWO TABLETS BY MOUTH ONCE DAILY  60 tablet  3  . pantoprazole (PROTONIX) 40 MG tablet TAKE ONE TABLET BY MOUTH ONCE DAILY  30 tablet  6  . potassium chloride SA (K-DUR,KLOR-CON) 20 MEQ tablet Take 1 tablet (20 mEq total) by mouth as needed.  30 tablet  3  . warfarin (COUMADIN) 5 MG tablet TAKE 7.5 MG (1 1/2 TABLETS) ON TUES, THUR, SAT AND THEN 5MG  ON MON, WED, FRI AND SUNDAY  45 tablet  3  . amoxicillin (AMOXIL) 500 MG capsule Take 4 tabs 1 hour prior to dental work  4 capsule  3   No current facility-administered medications for this encounter.   Imdur and  Metoprolol  REVIEW OF SYSTEMS: All systems negative except as listed in HPI, PMH and Problem list.  LVAD INTERROGATION:  HeartMate II LVAD:  Flow 5.7, speed 10390, power 7.0, PI 5.2. Set speed 10,400 low speed: 9800 Alarms: none Events: multiple PI events associated with bending over/working; pt is symptomatic - dizzy, weak  Decreased fixed speed to 10,200 with low speed limit to 8600; back up controller re-programmed to same settings.   I reviewed the LVAD parameters from today, and compared the results to the patient's prior recorded data.  We changed his speed as above.  The LVAD is functioning within specified parameters.  The patient performs LVAD self-test daily.  LVAD interrogation was negative for any significant power changes or alarms. He has had numerous PI events associated with bending over. LVAD equipment check completed and is in good working order.  Back-up equipment present.   LVAD education done on emergency procedures and precautions and reviewed exit site care.    Filed Vitals:   12/27/12 1317  BP: 100/0  Pulse: 85  Weight: 227 lb 8 oz (103.193 kg)  SpO2: 96%  Last weight: 226 lbs  Physical Exam: GENERAL: Well appearing, male who presents to clinic today in no acute distress. HEENT: normal  NECK: Supple, JVP flat .  2+ bilaterally, no bruits. No lymphadenopathy or thyromegaly appreciated.   CARDIAC:  Mechanical heart sounds with LVAD hum present.  LUNGS:  Clear to auscultation bilaterally.  ABDOMEN:  Soft, round, nontender, positive bowel sounds x4.     LVAD exit site: Dressing dry and intact.  No erythema or drainage.  Stabilization device present and accurately applied.  Driveline dressing is being changed q  3 days due to moisture under the sorbaview dressing.  EXTREMITIES:  Warm and dry, no cyanosis, clubbing, rash or edema  NEUROLOGIC:  Alert and oriented x 4.  Gait steady.  No aphasia.  No dysarthria.  Affect pleasant.     ASSESSMENT AND PLAN:   1) Chronic  systolic HF, s/p HMII LVAD implant 05/2012 - Robert [name] is currently on the 1b list at [facility] for transplant. - NYHA II symptoms and appears euvolmic. He is now drinking 1-2 gallons of water a day. - MAP remains elevated 100 will increase hydralazine to 25 mg TID.  - Continue ACE-I, BB, and hydralazine  2) LVAD - Still having multiple PI events that seem to be associated with bending over. Tried to replicate PI events and dizziness in clinic with patient bending over, but not able to reproduce symptoms. - Suspect that patient may be experiencing suction events with bending over, will decrease speed to 10200 to see if this helps. - Will check LDH, BMET, INR and CBC today. - MAP 100 today; Will increase Hydralazine to 25 mg three times daily.  - Instructed to follow up next week for RN visit  3) HTN - MAP remains elevated see plan above  4) Hx VT - continue amiodarone 200 mg daily, can consider trying to discontinue next visit. - continue Toprol 25 mg BID, will confirm patient is taking (has not tolerated increased doses in the past)  5) Anticoagulation - INR today and will dose  F/U 1 month  Ulla Potash B NP-C 9:46 PM

## 2012-12-27 NOTE — Patient Instructions (Addendum)
1. Increase Hydralazine to 25 mg three times daily 2. Return to clinic 2 weeks 3. Will call with lab results and coumadin instructions

## 2012-12-27 NOTE — Patient Instructions (Addendum)
Take 2.5 mg today; then resume previous dosing

## 2012-12-28 ENCOUNTER — Other Ambulatory Visit (HOSPITAL_COMMUNITY): Payer: Self-pay | Admitting: *Deleted

## 2012-12-28 DIAGNOSIS — Z95811 Presence of heart assist device: Secondary | ICD-10-CM

## 2012-12-28 DIAGNOSIS — Z7901 Long term (current) use of anticoagulants: Secondary | ICD-10-CM

## 2012-12-29 NOTE — Addendum Note (Signed)
Encounter addended by: Ernestina Penna on: 12/29/2012 11:40 AM<BR>     Documentation filed: Charges VN

## 2013-01-05 ENCOUNTER — Encounter: Payer: Self-pay | Admitting: Internal Medicine

## 2013-01-08 ENCOUNTER — Other Ambulatory Visit: Payer: Self-pay | Admitting: *Deleted

## 2013-01-08 DIAGNOSIS — Z95811 Presence of heart assist device: Secondary | ICD-10-CM

## 2013-01-08 DIAGNOSIS — Z7901 Long term (current) use of anticoagulants: Secondary | ICD-10-CM

## 2013-01-09 ENCOUNTER — Encounter (HOSPITAL_COMMUNITY): Payer: BC Managed Care – PPO

## 2013-01-10 ENCOUNTER — Encounter (HOSPITAL_COMMUNITY): Payer: BC Managed Care – PPO

## 2013-01-10 ENCOUNTER — Other Ambulatory Visit: Payer: BC Managed Care – PPO

## 2013-01-11 ENCOUNTER — Other Ambulatory Visit (HOSPITAL_COMMUNITY): Payer: Self-pay | Admitting: *Deleted

## 2013-01-11 DIAGNOSIS — Z95811 Presence of heart assist device: Secondary | ICD-10-CM

## 2013-01-11 DIAGNOSIS — Z7901 Long term (current) use of anticoagulants: Secondary | ICD-10-CM

## 2013-01-12 ENCOUNTER — Telehealth (HOSPITAL_COMMUNITY): Payer: Self-pay | Admitting: *Deleted

## 2013-01-12 ENCOUNTER — Ambulatory Visit (INDEPENDENT_AMBULATORY_CARE_PROVIDER_SITE_OTHER): Payer: BC Managed Care – PPO | Admitting: Cardiology

## 2013-01-12 ENCOUNTER — Other Ambulatory Visit (HOSPITAL_COMMUNITY): Payer: Self-pay | Admitting: *Deleted

## 2013-01-12 ENCOUNTER — Ambulatory Visit (HOSPITAL_COMMUNITY): Payer: Self-pay | Admitting: *Deleted

## 2013-01-12 ENCOUNTER — Encounter (HOSPITAL_COMMUNITY): Payer: Self-pay | Admitting: *Deleted

## 2013-01-12 DIAGNOSIS — Z7901 Long term (current) use of anticoagulants: Secondary | ICD-10-CM

## 2013-01-12 DIAGNOSIS — Z95811 Presence of heart assist device: Secondary | ICD-10-CM

## 2013-01-12 DIAGNOSIS — Z95818 Presence of other cardiac implants and grafts: Secondary | ICD-10-CM

## 2013-01-12 LAB — LACTATE DEHYDROGENASE: LDH: 247 U/L (ref 94–250)

## 2013-01-12 LAB — PROTIME-INR: INR: 1.4 ratio — ABNORMAL HIGH (ref 0.8–1.0)

## 2013-01-12 MED ORDER — ENOXAPARIN SODIUM 100 MG/ML ~~LOC~~ SOLN: 100.0000 mg | Freq: Two times a day (BID) | SUBCUTANEOUS | Status: DC

## 2013-01-12 NOTE — Telephone Encounter (Signed)
Called pt per Dr. Gala Romney - left message on home and cell phone. Instructed pt to call VAD pager for instructions re: INR 1.6. Pt will need Lovenox as bridge until INR is within goal; Rx will be sent now and patient needs to pick up today and start today.

## 2013-01-12 NOTE — Progress Notes (Unsigned)
Pt presented for BP check per VAD coordinator.  BP: 104; re-checked 106  VAD parameters:  Flow:  --- Speed:  10,190 Power: 6.0 PI: 8.0 Alarms: one low voltage advisory (pt let batteries get low last night) Events: few PI (3 - 5 daily) Fixed speed:  10,200 Low speed limit:  9600  Pt states he is feeling better overall; fewer dizzy spells. Had one "spell" yesterday while bending over - felt his heart race for few seconds but denies ICD shock.   Pt confirms he is taking: Hydralazine 25 mg tid Lisinopril 40 mg am and 20 mg pm Norvasc 10 mg daily  Pt did not take any meds this morning; thought he would get home before BP check and lab draw at [facility].  Next VAD clinic visit scheduled 01/24/13.

## 2013-01-13 ENCOUNTER — Other Ambulatory Visit (HOSPITAL_COMMUNITY): Payer: Self-pay | Admitting: *Deleted

## 2013-01-13 DIAGNOSIS — Z95811 Presence of heart assist device: Secondary | ICD-10-CM

## 2013-01-13 DIAGNOSIS — Z7901 Long term (current) use of anticoagulants: Secondary | ICD-10-CM

## 2013-01-13 NOTE — Patient Instructions (Signed)
Take 10 mg today; 7.5 mg Sat, and 5 mg Sun. Begin subq Lovenox 100 mg today, take two doses Sat, 2 doses Sun, and one dose Monday am. Re-check INR Monday am.

## 2013-01-13 NOTE — Telephone Encounter (Signed)
Pt returned call to VAD pager; informed him of low INR and importance of starting subq Lovenox this evening. Pt will pick up Rx and begin tonight; continue twice daily doing until INR re-check Monday. Instructed pt to call if any changes in VAD parameter trends; pt verbalized understanding of same.

## 2013-01-14 LAB — ICD DEVICE OBSERVATION

## 2013-01-15 ENCOUNTER — Other Ambulatory Visit (HOSPITAL_COMMUNITY): Payer: Self-pay | Admitting: *Deleted

## 2013-01-15 ENCOUNTER — Telehealth (HOSPITAL_COMMUNITY): Payer: Self-pay | Admitting: *Deleted

## 2013-01-15 ENCOUNTER — Ambulatory Visit (HOSPITAL_COMMUNITY): Payer: Self-pay | Admitting: *Deleted

## 2013-01-15 ENCOUNTER — Other Ambulatory Visit (INDEPENDENT_AMBULATORY_CARE_PROVIDER_SITE_OTHER): Payer: BC Managed Care – PPO

## 2013-01-15 ENCOUNTER — Ambulatory Visit: Payer: BC Managed Care – PPO

## 2013-01-15 DIAGNOSIS — Z7901 Long term (current) use of anticoagulants: Secondary | ICD-10-CM

## 2013-01-15 DIAGNOSIS — I2789 Other specified pulmonary heart diseases: Secondary | ICD-10-CM

## 2013-01-15 DIAGNOSIS — I272 Pulmonary hypertension, unspecified: Secondary | ICD-10-CM

## 2013-01-15 DIAGNOSIS — Z95811 Presence of heart assist device: Secondary | ICD-10-CM

## 2013-01-15 DIAGNOSIS — I428 Other cardiomyopathies: Secondary | ICD-10-CM

## 2013-01-15 DIAGNOSIS — I5022 Chronic systolic (congestive) heart failure: Secondary | ICD-10-CM

## 2013-01-15 NOTE — Patient Instructions (Signed)
2.5 mg today; then 5 mg daily

## 2013-01-15 NOTE — Telephone Encounter (Signed)
Called pt for INR f/u; INR 2.6 today - will stop Lovenox injections. Reinforced dietary education with patient about eating greens.  Reminded to eat small portions, the same # of times each week so as to decrease variability of INR. Pt verbalized understanding of same.

## 2013-01-16 ENCOUNTER — Encounter: Payer: Self-pay | Admitting: *Deleted

## 2013-01-16 LAB — REMOTE ICD DEVICE
AL AMPLITUDE: 2 mv
AL IMPEDENCE ICD: 475 Ohm
AL THRESHOLD: 0.75 V
ATRIAL PACING ICD: 94.1 pct
CHARGE TIME: 10 s
RV LEAD AMPLITUDE: 8.1 mv
RV LEAD IMPEDENCE ICD: 380 Ohm
RV LEAD THRESHOLD: 1.375 V
TZAT-0001SLOWVT: 1
TZAT-0001SLOWVT: 2
TZAT-0013SLOWVT: 3
TZAT-0018SLOWVT: NEGATIVE
TZAT-0018SLOWVT: NEGATIVE
TZON-0003ATACH: 350.8 ms
TZST-0001SLOWVT: 6
TZST-0003SLOWVT: 35 J
TZST-0003SLOWVT: 35 J
TZST-0003SLOWVT: 35 J
TZST-0003SLOWVT: 35 J

## 2013-01-18 ENCOUNTER — Other Ambulatory Visit (HOSPITAL_COMMUNITY): Payer: Self-pay | Admitting: *Deleted

## 2013-01-18 DIAGNOSIS — Z7901 Long term (current) use of anticoagulants: Secondary | ICD-10-CM

## 2013-01-18 DIAGNOSIS — Z95811 Presence of heart assist device: Secondary | ICD-10-CM

## 2013-01-19 ENCOUNTER — Ambulatory Visit (HOSPITAL_COMMUNITY): Payer: Self-pay | Admitting: Pharmacist

## 2013-01-19 ENCOUNTER — Other Ambulatory Visit (INDEPENDENT_AMBULATORY_CARE_PROVIDER_SITE_OTHER): Payer: BC Managed Care – PPO

## 2013-01-19 DIAGNOSIS — I2789 Other specified pulmonary heart diseases: Secondary | ICD-10-CM

## 2013-01-19 DIAGNOSIS — I428 Other cardiomyopathies: Secondary | ICD-10-CM

## 2013-01-19 DIAGNOSIS — Z95811 Presence of heart assist device: Secondary | ICD-10-CM

## 2013-01-19 DIAGNOSIS — I5022 Chronic systolic (congestive) heart failure: Secondary | ICD-10-CM

## 2013-01-19 DIAGNOSIS — I272 Pulmonary hypertension, unspecified: Secondary | ICD-10-CM

## 2013-01-19 LAB — PROTIME-INR
INR: 2 ratio — ABNORMAL HIGH (ref 0.8–1.0)
Prothrombin Time: 21.3 s — ABNORMAL HIGH (ref 10.2–12.4)

## 2013-01-24 ENCOUNTER — Ambulatory Visit (HOSPITAL_COMMUNITY): Payer: Self-pay | Admitting: *Deleted

## 2013-01-24 ENCOUNTER — Ambulatory Visit (HOSPITAL_COMMUNITY)
Admission: RE | Admit: 2013-01-24 | Discharge: 2013-01-24 | Disposition: A | Discharge status: Home / Self Care | Payer: BC Managed Care – PPO | Source: Ambulatory Visit | Attending: Internal Medicine | Admitting: Internal Medicine

## 2013-01-24 ENCOUNTER — Other Ambulatory Visit (HOSPITAL_COMMUNITY): Payer: Self-pay | Admitting: *Deleted

## 2013-01-24 VITALS — BP 104/0 | HR 73 | Ht 74.0 in | Wt 233.8 lb

## 2013-01-24 DIAGNOSIS — Z959 Presence of cardiac and vascular implant and graft, unspecified: Secondary | ICD-10-CM | POA: Insufficient documentation

## 2013-01-24 DIAGNOSIS — Z95811 Presence of heart assist device: Secondary | ICD-10-CM

## 2013-01-24 DIAGNOSIS — I5022 Chronic systolic (congestive) heart failure: Secondary | ICD-10-CM

## 2013-01-24 DIAGNOSIS — Z7901 Long term (current) use of anticoagulants: Secondary | ICD-10-CM

## 2013-01-24 DIAGNOSIS — I472 Ventricular tachycardia, unspecified: Secondary | ICD-10-CM

## 2013-01-24 DIAGNOSIS — I10 Essential (primary) hypertension: Secondary | ICD-10-CM

## 2013-01-24 DIAGNOSIS — Z95818 Presence of other cardiac implants and grafts: Secondary | ICD-10-CM

## 2013-01-24 LAB — COMPREHENSIVE METABOLIC PANEL
AST: 43 U/L — ABNORMAL HIGH (ref 0–37)
Albumin: 4.1 g/dL (ref 3.5–5.2)
Alkaline Phosphatase: 95 U/L (ref 39–117)
BUN: 14 mg/dL (ref 6–23)
Calcium: 9.1 mg/dL (ref 8.4–10.5)
Chloride: 100 mEq/L (ref 96–112)
Creatinine, Ser: 1.37 mg/dL — ABNORMAL HIGH (ref 0.50–1.35)
GFR calc Af Amer: 65 mL/min — ABNORMAL LOW (ref >90)
Total Bilirubin: 0.6 mg/dL (ref 0.3–1.2)
Total Protein: 7.8 g/dL (ref 6.0–8.3)

## 2013-01-24 LAB — PROTIME-INR: INR: 2.49 — ABNORMAL HIGH (ref 0.00–1.49)

## 2013-01-24 LAB — CBC
HCT: 41.9 % (ref 39.0–52.0)
Hemoglobin: 13.9 g/dL (ref 13.0–17.0)
MCH: 28.8 pg (ref 26.0–34.0)
MCHC: 33.2 g/dL (ref 30.0–36.0)
MCV: 86.9 fL (ref 78.0–100.0)
Platelets: 174 10*3/uL (ref 150–400)
RBC: 4.82 MIL/uL (ref 4.22–5.81)

## 2013-01-24 MED ORDER — PANTOPRAZOLE SODIUM 40 MG PO TBEC: 40.0000 mg | DELAYED_RELEASE_TABLET | Freq: Every day | ORAL | Status: DC

## 2013-01-24 NOTE — Patient Instructions (Signed)
No change in coumadin dose

## 2013-01-24 NOTE — Progress Notes (Signed)
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 status post dual chamber Medtronic ICD implanted April 2011. Cath 2011 showed normal coronaries. He also has a history of VTach. Currently listed on 1b list for tx.   Follow up: Doing well and taking medicaitons as prescribed. Remains very active and is drinking more fluids now. Denies SOB, orthopnea or CP. He does get dizzy with bending over, which is not new.   Denies LVAD alarms.  Denies driveline trauma, erythema or drainage.  Denies ICD shocks.  Pt is doing self dressing changes q 3 days.  Dressing changed today using sorbaview dressing; exit site without erythema, tenderness, drainage, or foul odor. Drive line secured with attachment device.  Reports taking Coumadin as prescribed and adherence to anticoagulation based dietary restrictions.  Denies bright red blood per rectum or melena, no dark urine or hematuria.     Symptom Yes No Details  Angina         x Activity:  Claudication         x How far:  Syncope         x When:      Dizzy after bending over   Stroke         x   Orthopnea         x How many pillows:  one  PND         x How often:  CPAP         x How many hrs:  Pedal edema         x   Abd fullness         x   N&V         x                     good appetite; eating full meals  Diaphoresis         x When:     Bleeding         x   Urine          Light yellow urine  SOB         x Activity:  Palpitations         x When: feels his heart "beating very hard" at times when working outside and bending over  ICD shock         x   Hospitlizaitons         x When/where/why:  ED visit         x When/where/why:  Other MD         x When/who/why:   Activity            No limitations if take rest breaks  Fluid        increased to 1.5 - 2 gallons  Diet     No limitations   Past Medical History  Diagnosis Date  . AICD  (automatic cardioverter/defibrillator) present   . Hypertension   . Congestive heart failure   . Pneumonia   . Bronchitis     Current Outpatient Prescriptions  Medication Sig Dispense Refill  . allopurinol (ZYLOPRIM) 300 MG tablet TAKE ONE TABLET BY MOUTH ONCE DAILY  30 tablet  6  . amLODipine (NORVASC) 10 MG tablet Take 1 tablet (10 mg total) by mouth daily.  30 tablet  6  . aspirin EC 325 MG tablet Take 1 tablet (325 mg total) by mouth daily.  30 tablet  0  . hydrALAZINE (APRESOLINE) 25 MG tablet Take 1 tablet (25 mg total) by mouth 3 (three) times daily.  90 tablet  6  . lisinopril (PRINIVIL,ZESTRIL) 20 MG tablet Take by mouth 2 (two) times daily. Take 2 tabs (40mg ) in AM, 1 tab (20mg ) in PM      . PACERONE 200 MG tablet TAKE TWO TABLETS BY MOUTH ONCE DAILY  60 tablet  3  . pantoprazole (PROTONIX) 40 MG tablet TAKE ONE TABLET BY MOUTH ONCE DAILY  30 tablet  6  . warfarin (COUMADIN) 5 MG tablet Take 7.5mg  Mon/Sat and 5mg  all other days.      Marland Kitchen amoxicillin (AMOXIL) 500 MG capsule Take 4 tabs 1 hour prior to dental work  4 capsule  3  . potassium chloride SA (K-DUR,KLOR-CON) 20 MEQ tablet Take 1 tablet (20 mEq total) by mouth as needed.  30 tablet  3   No current facility-administered medications for this encounter.   Imdur and Metoprolol  REVIEW OF SYSTEMS: All systems negative except as listed in HPI, PMH and Problem list.  LVAD INTERROGATION:  HeartMate II LVAD:   Flow 5.2 speed 10190 power 6.7 PI 6.2  Set speed 10,200  Low speed limit: 9600 Alarms: none Events: PI events (5 - 10 per 24 hr period)  I reviewed the LVAD parameters from today, and compared the results to the patient's prior recorded data. No changes were made.The LVAD is functioning within specified parameters.  The patient performs LVAD self-test daily.  LVAD interrogation was negative for any significant power changes or alarms. He has had a few PI events associated with bending over. LVAD equipment check  completed and is in good working order.  Back-up equipment present.   LVAD education done on emergency procedures and precautions and reviewed exit site care.   Patient currently has two pocket controllers version v7.19:  Primary SN: [id number]  Secondary SN: [id number]  Per [employer] recommendation, controllers replaced with updated version v7.23 controller.  Primary controller change out completed by VAD coordinator with no adverse affects. New controllers with settings as below:  Primary SN: [id number]  Secondary SN: [id number]  Fixed speed:  10,200 RPM  Low speed limit:  9600 RPM    Filed Vitals:   01/24/13 1115  BP: 104/0  Pulse: 73  Height: 6\' 2"  (1.88 m)  Weight: 233 lb 12.8 oz (106.051 kg)  SpO2: 98%  Last weight: 227 lbs  Physical Exam: GENERAL: Well appearing, male who presents to clinic today in no acute distress. HEENT: normal  NECK: Supple, JVP flat .  2+ bilaterally, no bruits. No lymphadenopathy or thyromegaly appreciated.   CARDIAC:  Mechanical heart sounds with LVAD hum present.  LUNGS:  Clear to auscultation bilaterally.  ABDOMEN:  Soft, round, nontender, positive bowel sounds x4.     LVAD exit site: Dressing dry and intact.  No erythema or drainage.  Stabilization device present and accurately applied.  Driveline dressing is being changed q 3 days due to moisture under the sorbaview dressing.  EXTREMITIES:  Warm and dry, no cyanosis, clubbing, rash or edema  NEUROLOGIC:  Alert and oriented x 4.  Gait steady.  No aphasia.  No dysarthria.  Affect pleasant.     ASSESSMENT AND PLAN:   1) Chronic systolic HF, s/p HMII LVAD implant 05/2012 - Robert [name] is currently on the 1b list at [facility]  for transplant. He has NYHA II symptoms on LVAD support and his volume status is stable. He is currently not on any diuretics and will continue to keep off. He is aware to call if weight starts trendin up. - He is not on BB d/t intolerance in the past. Will continue ACE-I at current dose  40/20. Abnormal dosing, but patient has had multiple med changes d/t HTN and this regimen has worked the best. He still has elevated MAP, but much improved. Will also continue hydralazine 25 mg TID and amlodipine 10 mg daily. If MAP elevated next visit will increase hydralazine. Not on IMDUR d/t sever headaches. - BMET today.  2) LVAD - Doing great. LVAD controllers swithced out today per recommendations from [employer]. Education repeated with patient from VAD Coordinator on how to switch controller out. Will get LDH and INR today.  3) HTN - As above will continue current regimen 4) Hx VT - continue amiodarone 400 mg daily. Will need to check TSH next visit. LFTs nl. 5) Anticoagulation -check INR today  F/U Leodis Sias B NP-C 9:26 PM

## 2013-01-24 NOTE — Patient Instructions (Signed)
1.  No med changes today 2.  Will call patient with INR results and coumadin dosing 3.  Return to VAD clinic in 1 month

## 2013-01-25 ENCOUNTER — Other Ambulatory Visit (HOSPITAL_COMMUNITY): Payer: Self-pay | Admitting: *Deleted

## 2013-01-25 DIAGNOSIS — Z95811 Presence of heart assist device: Secondary | ICD-10-CM

## 2013-01-25 DIAGNOSIS — Z7901 Long term (current) use of anticoagulants: Secondary | ICD-10-CM

## 2013-01-25 DIAGNOSIS — Z5181 Encounter for therapeutic drug level monitoring: Secondary | ICD-10-CM

## 2013-02-07 ENCOUNTER — Other Ambulatory Visit (INDEPENDENT_AMBULATORY_CARE_PROVIDER_SITE_OTHER): Payer: BC Managed Care – PPO

## 2013-02-07 ENCOUNTER — Ambulatory Visit (HOSPITAL_COMMUNITY): Payer: Self-pay | Admitting: *Deleted

## 2013-02-07 DIAGNOSIS — I5022 Chronic systolic (congestive) heart failure: Secondary | ICD-10-CM

## 2013-02-07 DIAGNOSIS — I2789 Other specified pulmonary heart diseases: Secondary | ICD-10-CM

## 2013-02-07 DIAGNOSIS — I272 Pulmonary hypertension, unspecified: Secondary | ICD-10-CM

## 2013-02-07 DIAGNOSIS — I428 Other cardiomyopathies: Secondary | ICD-10-CM

## 2013-02-07 LAB — PROTIME-INR
INR: 3.5 ratio — ABNORMAL HIGH (ref 0.8–1.0)
Prothrombin Time: 36.3 s — ABNORMAL HIGH (ref 10.2–12.4)

## 2013-02-08 ENCOUNTER — Other Ambulatory Visit: Payer: Self-pay

## 2013-02-14 ENCOUNTER — Other Ambulatory Visit (INDEPENDENT_AMBULATORY_CARE_PROVIDER_SITE_OTHER): Payer: BC Managed Care – PPO

## 2013-02-14 ENCOUNTER — Ambulatory Visit (HOSPITAL_COMMUNITY): Payer: Self-pay | Admitting: *Deleted

## 2013-02-14 DIAGNOSIS — Z7901 Long term (current) use of anticoagulants: Secondary | ICD-10-CM

## 2013-02-14 DIAGNOSIS — I5022 Chronic systolic (congestive) heart failure: Secondary | ICD-10-CM

## 2013-02-14 DIAGNOSIS — Z95811 Presence of heart assist device: Secondary | ICD-10-CM

## 2013-02-14 DIAGNOSIS — I428 Other cardiomyopathies: Secondary | ICD-10-CM

## 2013-02-14 DIAGNOSIS — I272 Pulmonary hypertension, unspecified: Secondary | ICD-10-CM

## 2013-02-14 DIAGNOSIS — I2789 Other specified pulmonary heart diseases: Secondary | ICD-10-CM

## 2013-02-14 LAB — PROTIME-INR: Prothrombin Time: 35.9 s — ABNORMAL HIGH (ref 10.2–12.4)

## 2013-02-16 ENCOUNTER — Telehealth: Payer: Self-pay | Admitting: *Deleted

## 2013-02-16 DIAGNOSIS — I5023 Acute on chronic systolic (congestive) heart failure: Secondary | ICD-10-CM

## 2013-02-16 DIAGNOSIS — I428 Other cardiomyopathies: Secondary | ICD-10-CM

## 2013-02-16 DIAGNOSIS — I10 Essential (primary) hypertension: Secondary | ICD-10-CM

## 2013-02-16 DIAGNOSIS — Z7901 Long term (current) use of anticoagulants: Secondary | ICD-10-CM

## 2013-02-16 DIAGNOSIS — Z95811 Presence of heart assist device: Secondary | ICD-10-CM

## 2013-02-16 MED ORDER — HYDRALAZINE HCL 25 MG PO TABS: 50.0000 mg | ORAL_TABLET | Freq: Three times a day (TID) | ORAL | Status: DC

## 2013-02-16 MED ORDER — WARFARIN SODIUM 5 MG PO TABS: ORAL_TABLET | ORAL | Status: DC

## 2013-02-16 MED ORDER — LISINOPRIL 20 MG PO TABS: 40.0000 mg | ORAL_TABLET | Freq: Two times a day (BID) | ORAL | Status: DC

## 2013-02-16 NOTE — Telephone Encounter (Signed)
Pt beeped VAD coordinator with questions re: BP meds after visit at with Dr. Allena Katz at [facility] yesterday. Reviewed current meds and confirmed pt is taking: Lisinopril 40 mg twice daily (ordered 40 am/ 20 pm) Hydralazine 25 mg tid Norvasc 10 mg daily  Per Dr. Gala Romney instructed pt to take: Continue Lisinopril 40 mg bid Norvasc 10 mg daily Increase Hydralazine to 50 mg three times daily  Pt wrote above instructions down and read back to me; asked pt to come by VAD clinic next Wednesday after getting INR drawn for BP check. Pt verbalized understanding of same.

## 2013-02-18 LAB — ICD DEVICE OBSERVATION

## 2013-02-19 ENCOUNTER — Ambulatory Visit (INDEPENDENT_AMBULATORY_CARE_PROVIDER_SITE_OTHER): Payer: BC Managed Care – PPO

## 2013-02-19 DIAGNOSIS — I428 Other cardiomyopathies: Secondary | ICD-10-CM

## 2013-02-19 DIAGNOSIS — I5022 Chronic systolic (congestive) heart failure: Secondary | ICD-10-CM

## 2013-02-19 DIAGNOSIS — I472 Ventricular tachycardia: Secondary | ICD-10-CM

## 2013-02-19 DIAGNOSIS — I5023 Acute on chronic systolic (congestive) heart failure: Secondary | ICD-10-CM

## 2013-02-21 ENCOUNTER — Encounter (HOSPITAL_COMMUNITY): Payer: Self-pay | Admitting: *Deleted

## 2013-02-21 ENCOUNTER — Ambulatory Visit (HOSPITAL_COMMUNITY): Payer: Self-pay | Admitting: *Deleted

## 2013-02-21 ENCOUNTER — Other Ambulatory Visit (HOSPITAL_COMMUNITY): Payer: Self-pay | Admitting: *Deleted

## 2013-02-21 ENCOUNTER — Other Ambulatory Visit (INDEPENDENT_AMBULATORY_CARE_PROVIDER_SITE_OTHER): Payer: BC Managed Care – PPO

## 2013-02-21 DIAGNOSIS — Z95811 Presence of heart assist device: Secondary | ICD-10-CM

## 2013-02-21 DIAGNOSIS — I5023 Acute on chronic systolic (congestive) heart failure: Secondary | ICD-10-CM

## 2013-02-21 DIAGNOSIS — I272 Pulmonary hypertension, unspecified: Secondary | ICD-10-CM

## 2013-02-21 DIAGNOSIS — I428 Other cardiomyopathies: Secondary | ICD-10-CM

## 2013-02-21 DIAGNOSIS — Z7901 Long term (current) use of anticoagulants: Secondary | ICD-10-CM

## 2013-02-21 DIAGNOSIS — I10 Essential (primary) hypertension: Secondary | ICD-10-CM

## 2013-02-21 DIAGNOSIS — I2789 Other specified pulmonary heart diseases: Secondary | ICD-10-CM

## 2013-02-21 DIAGNOSIS — I5022 Chronic systolic (congestive) heart failure: Secondary | ICD-10-CM

## 2013-02-21 LAB — PROTIME-INR
INR: 1.4 ratio — ABNORMAL HIGH (ref 0.8–1.0)
Prothrombin Time: 15.2 s — ABNORMAL HIGH (ref 10.2–12.4)

## 2013-02-21 MED ORDER — LISINOPRIL 20 MG PO TABS: ORAL_TABLET | ORAL | Status: DC

## 2013-02-21 MED ORDER — HYDRALAZINE HCL 25 MG PO TABS: 50.0000 mg | ORAL_TABLET | Freq: Three times a day (TID) | ORAL | Status: DC

## 2013-02-21 MED ORDER — AMIODARONE HCL 200 MG PO TABS: 200.0000 mg | ORAL_TABLET | Freq: Every day | ORAL | Status: DC

## 2013-02-21 NOTE — Progress Notes (Deleted)
[place] CLINICAL SOCIAL WORK DOCUMENTATION LVAD (Left Ventricular Assist Device) Psychosocial Screening Please remember that all information is confidential within the members of the VAD team and [facility]   Patient:  Robert [name]  MRN:  [id number]  Account:  [id number]  Clinical Social Worker:  Lupita Leash Rayelle Armor, LCSWA Date/Time Initiated:   05/31/2012 01:00 PM Referral Source:    Dr. Donata Clay, Ulla Potash, NP- VAD Coordinator, medical record  Referral Reason:   Placement of LVAD  Source of Information:   Patient, wife, medical record   PATIENT DEMOGRAPHICS NAME:   Robert [name]     DOB:  12-Dec-1955  SS#:  [id number] Address:   [address]  [place]  [place] [address] Home Phone:  [phone]  Cell Phone:    Marital Status:   Single     Primary Language:  ENGLISH  Faith:  HOLINESS Adherence with Medical Regimen:   Excellent  Medication Adherence:   Excellent  Physician appointment attendance:   Excellent "I won't miss my appointments unless I was really sick"   Do you have a Living Will or Medical POA?  N Would you like to complete a Living Will and Medical POA prior to surgery?  N Are you currently a DNR?  N Do you have a MOST form?  N Would you like to review one?  Y Do you have goals of care?  Y   Have you had a consult with the palliative care team at [facility]?  N Comment:  Met with palliative nurse practioner and states questions were answered  Psychological Health Appearance:   Sitting up in bed, wearing hospital attire. Good hygiene and well groomed  Mental status:   Alert and oriented  Eye Contact:   Very good and appropriate  Thought Content:   Rational, organized, appropriate  Speech:   Clear, normal volume, easily understood  Mood:   Relaxed, appeared to be in good spirits- uitilized humor throughout the interview  Affect:   Full ranging and appropriate for mood  Insight:   Very good  Judgment:   Very good  Interaction  Style:   Very good   Family/Social Information Who lives in your home? Name9  Lives alone      Do you have a plan for child care if relevant?   NA. Has a 15 year old son who lives with his mother.  Stays with him on the weekends but he can also stay with his mother on the weekends or with his parents.  List family members outside the home (parents, friends, pastor, etc..) Name2  Brynda Peon" [place]  Ann Rozanna Box   Age2  46  96  29  35  40   Relationship to [place]  Mother  Father  Sister  Oldest Son  Nephew    Please list people who give you emotional support (family, parents, friends, pastor, etc..) Name3  Same as above      Who is your primary and backup support pre and post-surgery? Explain the relationships i.e. strengths/weakness, etc.:   Primary caregiver is his sisterDewayne Hatch. She and patient are close and he feels he has a strong trust relationship with her.  Secondary Caregiver identified:   Parents will provide secondary support as will son and nephew   Legal Do you currently have any legal issues/problems?   None  Durable POA or Legal Next of Kin:   No Durable POA  Next of Kin would be his mother and fatherMalena Catholic and Lyda Jester  [phone]   Living situation Travel distance to [facility]:  about 15 minutes  Second Hand Smoke Exposure:   None  Self- Care level:   Has been fully indepedent of his ADL's  Ambulation:   Fully ambulatory- no assistive device needed at time  Transportation:   Patient has a car and states that his primary and secondary caregivers all have cars  Limitations:   "When I try to walk very far I 'give out'"  Barriers impacting ability to participate in care:   None at this time   Community Are you active with community agencies/resources?   None  Are you active in a church, synagogue, mosque, or other faith based community?   Yes- very active in his church   What other sources do you have for spiritual support?   Reads his bible, church members are supportive as is his pastor  Are you active in any clubs or social organizations?   None  What do you do for fun? hobbies, interests?  Drag Races, anything to do wiht cars or motorcycles. Loves watching basketball and once enjoyed playing.  Enjoys TV as well.   Education/Work information What is the last grade of school you completed?  12th  graduated  Preferred method of learning? (Written, verbal, hands on):   "Show me and let me try"  Do you have any problems with reading or writing?  None  Are you currently employed? If no, when were you last employed?   No- not currently employed. States he was able to work some until about 3 weeks ago but found he coudn't continue  Name of employer:  Please describe what kind of work you do/did?   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 At one time he states he was working 3 jobs but had to stop in 2012.  He also was working for a bank.  How long have you worked there?   "I've worked jobs all my life." Stopped working about 3 weeks ago.  If you are not currently working, do you plan to return to work after VAD surgery?   "Yes- I am hoping to return to work as soon as possible"  If yes, what type of employment do you hope to find?   "I hope to go back to at least one of my jobs- preferably with the pharmacy"  Are you interested in job training or learning new skills?   No- not at this time but I might in the future."  Did you serve in the Military? If so, what branch?   No   Financial Information What is your source of income?    No income at this time.  Was bringing home approximately $550.00 per week with his job at the pharmacy and the job at the bank was about $450.00 per week base pluse extra for deliveries  Do you have difficulty meeting your monthly expenses?   "I will in the future but I was managing before"  If  yes, which ones?    NA  How do you usually cope with this?    "I will have to be very careful with my savings and budget- I will manage- I always do"  Primary Health Insurance:    BCBS  Premium is $14.00 per month (obtained through the Affordable Care Act).  Secondary Insurance:  None  Have you applied for Medicaid?    Not yet but will look into this  Have you applied for Social Security Disability?    Yes- Patients states that he has been seeking disabilitybut was recently denied for the 4th time.  He is planning to hire a disability attorney to help him and medicaid as well.  Do you have prescription coverage?    "Yes- with my BCBS"  What are your prescription co-pays?    "Depends on the drug but it has been manageable"  Are you required to use a certain pharmacy?    No  Do you have a mail order option for your prescriptions?    No  If yes, what pharmacy do you use for mail order?    NA  Have you ever refused medication due to cost?    No- not yet  Discuss monthly cost for dressing supplies post procedure $150-300    CSW and patient discussed costs of dressing supplies and he feels that this will be a hardship if he has to pay for them out of pocket.  He states that he was told by the VAD coordinator- Karie Mainland that they would be billing his BCBS and hopefully this will be covered; if not- then trying to get Medicaid.  Can you budget for this monthly expense?    "Not sure- things will be really tight but I know my family would help me some. " Patient related that he is also trying to sell some property  (a 2nd house and has several cars that he wants to sell."   Medical Information Briefly describe your medical history, surgeries and why you are here for evaluation.    "I really hadn't had any true problems until I got sick in 2011- before that- I felt great and was able to come and go as I wanted.  From there- I started having many issues with my heart and things have deteriorated  since.  Now they tell me that I need this VAD to help me to live but I am wanting to proceed with heart transplant.  (See H & P for further medical history)  Are you able to complete your ADL's?    Yes  Do you have any history of emotional, medical, physical or verbal trauma?    None  Do you have any family history of heart problems?    None  Do you smoke? If so, what is the amount and frequency?    Never smoked  Do you drink alcohol? If so, how many drinks a day/week?    No  Have you ever used illegal drugs or misused medications?    No  If yes, what drugs do you use and how often?    NA  Have you ever been treated for substance abuse?    No  If yes, where and when were you treated?    NA  Are you currently using illegal substances?    No   Mental Health History Have you ever had problems with depression, anxiety or other mental health issues?   No  If yes, have you seen a counselor, psychiatrist or therapist?    No  If you are currently experiencing problems are you interested in talking with a professional?    No  Would you be interested in participating in an LVAD support group?  Y LVAD support group for: Patient Caregivers patient Have you or are you taking any medications for anxiety/depression or  any mental health concerns?    No  If yes, Please list the medications?    NA  How have you been feeling in the past year?    "i've been feeling fair but seems that each week I feel worse.  I want to feel good again". I mostly just tire easily and this is frustrating."  How do you handle stressful situations?   "I'm pretty easy going- I try not to let things get to me."  What are your coping strategies? Please List:    Reads his bible, spends time with his son and family, "laughs"  Have you had any past or current thoughts of suicide?    No- never  How many hours do you sleep at night?    6-8  How is your appetite?   "Fairly good- somedays I am more hungry than others."   PHQ2- Depression Screen:    0  PHQ9 Depression screen (only complete if the PHQ2 is positive):    NA   Plan for VAD Implementation Do you know and understand what happens during VAD surgery?  Please describe your thoughts:   Pateint was able to describe with significant clarity about the device and actual process of the surgery. He speaks without hesitation and appears relaxed when describing the surgery  What do you know about the risks of any major surgery or use of general anesthesia?    "I know I could die on the table (laughs) or make it through.  Any surgery has risks but when you are messing with your heart- you know it's serious!"  What do you know about the risks and side effects associated with VAD surgery?    "They told me I have to be really careful not to get any infections or anything and that I could always have a stroke or something.  The doctor has been really up front with me about how serious this is."  Explain what will happen right after surgery?    "I'll be sleeping alot and they told me I'd be on a breathing machine for a while then later I'll go to a recovery floor until I'm able to go home."  Information obtained from:    Patient, LVAD coordinator, MD  What is your plan for transportation for the first 8 weeks post-surgery? (Patients are not recommended to drive post-surgery for 8 weeks)    "My family will take me to my appointments- either my son or my nephew. They told me I can't drive for a while".  Driver: Son [place], [place] other family members  Valid license:    Yes- patient has current drivers' license  Working Administrator, sports:    Pateint has a [employer].  States that has current inspection sticker  Airbags:  Yes Do you plan to disarm the airbags- there is a risk of discharging the device if the airbag were to deploy.   No plans at this time. Discussed with patient that he should ride in back seat of car whenever possible.  What do you  know about your diet post-surgery?    "Some- I hope to talk to someone later. I usually cook for myself but I try to eat right."  How do you plan to monitor your medications, current and future?    "I handle my own medications but if I needed help- I'd ask my family." This has never been a problem for me.  How do you plan to complete ADL's post-surgery (  Shower, dress, etc.)?    "I hope to do as much for myself as possible but at home- my sister will help with dressing changes; my son and nephew would help with personal things like getting a shower."  Will it be difficult to ask for help for your caregivers? If so, explain:    "No! I am very independent but I know i'm going to need help at first."  Please explain what you hope will be improved about your life as a result of receiving the VAD    "I will be able to move around again and hopefully go back to work. I'm tired of feeling so tired and that I can't do much for myself. It is frustrating. Also- I hope it will keep me going until I can get a transplant."  Please tell me your biggest concern or fear about receiving the VAD?    "Getting through the surgery is the first thing- I know this is very serious.  A little part of me is afraid I might not make it but I trust in God to get me through if it is meant to be."  How do you cope with your concerns/fears?    "I pray alot and read my bible; also I try to remember to laugh and count my blessings"  Please explain your understanding of how your body will change? Are you worried about these changes?    "I'm not worried about the changes- it's just me- I don't have a wife or anything so tubes and wires don't bother me."  Patient has spoken to a prior VAD recipient and was shown what the batteries and controller looks like.  Do you see any barriers to your surgery or follow up? If yes, please explain:   "No- none that I can see"     Understanding of LVAD Surgical procedures and risks:    Discussed  and reviewed  Electrical need for LVAD:    Discussed and reviewed. Has 3 prong plugs in his home. Patienht also has a safety plan should the power go off.  Safety precautions with LVAD (Water, etc.):   Discussed and reviewed  (no showers, no swimming,etc)  Potential side effects with LVAD:    Discussed and reviewed  Types of Advanced heart failure therapies available:    Discussed and reviewed.  On transplant list with [facility]  LVAD daily self-care (dressing changes, computer check, extra supplies):    Discussed and reviewed  Outpatient follow-up (follow-up in LVAD clinic; monitoring blood thinners)    Discussed and reviewed  Need for emergency planning:    Discussed and reviewed- patient has emergency back up plan in case of power outages  Expectations for LVAD:    Patient apperas to have very realistic expectations about the surgery and recovery. He has a very positive outlook.  Current level of motivation to prepare for LVAD:    Patient is anxious to process and appears very motivated to recover. Wants to proceed with transplant when he is stronger  Patient's perception of need for LVAD:    Accurate and realistic  Present level of consent for LVAD:    Patient is very agreeable and anxious to proceed with the VAD implantation  Reasons for seeking LVAD:    "I want to get better and to get a new heart. I want to live!"   Psychosocial Protective Factors  Very positive attitude and spirit- utilizes humor frequently Good coping skills No drug or alcohol history Has  reliable trasnportation Strong family support indicated Motivated and has realistic expectations of surgery and outcomes Has storng coping skills including a strong faith perspective No psychiatric history  noted Psychosocial Risk Factors  Lives alone Lost income due to inabiltiy to work Disability denied X's 4 May need financial support from family until disabilty is in place  Clinical Intervention: CSW will monitor and  assist/support patient during hospital recovery process. Will monitor for any signs/symptoms of depression or anxiety and provide appropriate intervention as needed.   Educated patient/family on the following Caregiver(s) role responsibilities:   Discussed with patient. CSW will monitor and support patient during hospital recovery process  Financial planning for LVAD:    Discussed and Reviewed  Role of Clinical Social Worker:    Discsused and Reviewed  Signs of depression and anxiety:    None noted at this time. Discussed signs/symptomes of depression and anxiety and will monitor and assist patient as needed  Support planning for LVAD:    Discussed and reviewed  LVAD process:    Discussed and reviewed- much of the clinical piece of the LVAD proceedure is being taught by the LVAD coordiantor and MD.  Caregiver contract/agreement:   Discussed and reviewed. CSW will provide ongoing support  Discussed Referral(s) to:    CSW to assess needs during recovery period  [employer]:    Follow up with [place] Dept of Social Services re: Medicaid  CSW to assess needs during recovery period  Clinical Impression Recommendations:    Mr. Gehret is a delightful, very pleasant 57 year old male who appears to be a very engaged and invovled in current need for surgery.  He is able to articulate a strong understanding of his surgery as well as the risk and benefits of same.  He feels he has good family support and is anxious to return to a prior level of independence and activity; wants to return to work. He appears very motifvated but also utilizes humor as a coping mechanism. HHe apperas to be an excellent psychosical candidate for LVAD implantation at this time.  Patient is hoping to proceed with heart transplant. Patient is currently without income and issues regarding disability must be addressed; patient is still hoping for recovery and return to work in the future as he feels he can still be a  viable and active peson. Will require follow up with DSS and [facility] financial services to monitor financial situation and needs. [name]. Nikaela Coyne, LCSWA  [phone]

## 2013-02-22 MED ORDER — WARFARIN SODIUM 5 MG PO TABS: ORAL_TABLET | ORAL | Status: DC

## 2013-02-22 NOTE — Progress Notes (Signed)
Pt presented to clinic for BP check with doppler MAP of 92.  Pt admits to "being confused" on meds. Reviewed meds with patient, reconciled med list, and home med sheet given on meds as he is currently taking:  Norvasc 10 mg daily  Pacerone 200 mg daily (states [facility] told him last week to decrease to this dose)  ASA 325 mg daily  Apresoline 50 mg three times daily (states he needs refill on this - sent)  Prinivil 40 mg in am and 20 mg in pm (previously stated he had been taking 40 in am and 40 in pm)  Protonix 40 mg daily  K-Dur 20 meq as needed  Allopurinol 300 mg daily  Coumadin 7.5 mg daily except 5 mg on Sat/Sun or Sun/Mon - two days/week    Spoke with patient - he agrees to come to clinic Monday 02/26/13 and meet with PharmD for complete med review and help with managing correct dosages of meds. Sheppard Coil, PharmD notified and agrees with plan.

## 2013-02-22 NOTE — Progress Notes (Unsigned)
Pt presented to clinic for BP check with MAP 92.  Pt admits to "being confused" on meds.

## 2013-02-26 ENCOUNTER — Other Ambulatory Visit (HOSPITAL_COMMUNITY): Payer: Self-pay | Admitting: Pharmacist

## 2013-02-26 ENCOUNTER — Other Ambulatory Visit (INDEPENDENT_AMBULATORY_CARE_PROVIDER_SITE_OTHER): Payer: BC Managed Care – PPO

## 2013-02-26 ENCOUNTER — Encounter: Payer: Self-pay | Admitting: Licensed Clinical Social Worker

## 2013-02-26 ENCOUNTER — Ambulatory Visit (HOSPITAL_COMMUNITY): Payer: Self-pay | Admitting: *Deleted

## 2013-02-26 ENCOUNTER — Encounter (HOSPITAL_COMMUNITY): Payer: Self-pay | Admitting: *Deleted

## 2013-02-26 DIAGNOSIS — I5022 Chronic systolic (congestive) heart failure: Secondary | ICD-10-CM

## 2013-02-26 DIAGNOSIS — Z7901 Long term (current) use of anticoagulants: Secondary | ICD-10-CM

## 2013-02-26 DIAGNOSIS — I2789 Other specified pulmonary heart diseases: Secondary | ICD-10-CM

## 2013-02-26 DIAGNOSIS — Z95811 Presence of heart assist device: Secondary | ICD-10-CM

## 2013-02-26 DIAGNOSIS — I428 Other cardiomyopathies: Secondary | ICD-10-CM

## 2013-02-26 DIAGNOSIS — I272 Pulmonary hypertension, unspecified: Secondary | ICD-10-CM

## 2013-02-26 LAB — PROTIME-INR: Prothrombin Time: 35 s — ABNORMAL HIGH (ref 10.2–12.4)

## 2013-02-26 NOTE — Progress Notes (Signed)
CSW was referred by Hessie Diener, VAD Coordinator to meet with patient to discuss financial resources. Patient discussed concerns of inability to meet household finances. He states that he was working 4 different jobs and has been unable to maintain some of the jobs due to illness. He has applied for disability although denied due to past history of unpaid taxes. Patient reports he has a plan and is self motivated to resolve his financial situation. CSW provided some resources for heating assistance and weatherization programs to off set household expenses and direct more available funds to [employer]. CSW provided contact information and patient will return call/visit if needed. [name], LCSW [phone]

## 2013-02-26 NOTE — Progress Notes (Signed)
Pt presented for BP check today. Initial doppler MAP was 104; re-check later was 100.   VAD interrogation revealed: Flow  6.4 Speed  10,200 RPM PI  6.9 Power 7.5 Alarms:  None Events:  20 PI events over last 3 days; prior to that pt had rare PI events noted  Pt states he has not been drinking as many fluids over the weekend and has had several "dizzy spells"; denies taking PRN diuretic; states weight has been stable at home.     Reece Leader, PharmD here to review and reconcile home meds with patient; new med sheet given to patient.  Pt having some financial issues; contacted our social worker Basil Dess) and she will meet with patient to discuss.   Informed patient to increase fluid and salt intake, make no med changes, and return in one week for BP check and med review per Ulla Potash, NP. Pt verbalized understanding of same.

## 2013-02-27 ENCOUNTER — Encounter: Payer: Self-pay | Admitting: *Deleted

## 2013-02-27 LAB — MDC_IDC_ENUM_SESS_TYPE_REMOTE
Brady Statistic AP VP Percent: 0.1 % — CL
Brady Statistic AP VS Percent: 96.8 %
Brady Statistic AS VP Percent: 0.1 % — CL
Brady Statistic AS VS Percent: 3.2 %
HighPow Impedance: 39 Ohm
HighPow Impedance: 45 Ohm
Lead Channel Impedance Value: 494 Ohm
Lead Channel Sensing Intrinsic Amplitude: 2.1 mV
Lead Channel Sensing Intrinsic Amplitude: 8.6 mV
Lead Channel Setting Pacing Amplitude: 1.5 V
Lead Channel Setting Pacing Amplitude: 2.25 V
Lead Channel Setting Pacing Pulse Width: 0.4 ms
Lead Channel Setting Sensing Sensitivity: 0.3 mV
Zone Setting Detection Interval: 350.8 ms
Zone Setting Detection Interval: 370.3 ms

## 2013-02-28 ENCOUNTER — Encounter: Payer: Self-pay | Admitting: Cardiovascular Disease

## 2013-03-05 ENCOUNTER — Ambulatory Visit (HOSPITAL_COMMUNITY): Payer: Self-pay | Admitting: *Deleted

## 2013-03-05 ENCOUNTER — Other Ambulatory Visit: Payer: BC Managed Care – PPO

## 2013-03-05 ENCOUNTER — Other Ambulatory Visit (HOSPITAL_COMMUNITY): Payer: Self-pay | Admitting: Internal Medicine

## 2013-03-05 DIAGNOSIS — Z95811 Presence of heart assist device: Secondary | ICD-10-CM

## 2013-03-05 DIAGNOSIS — Z7901 Long term (current) use of anticoagulants: Secondary | ICD-10-CM

## 2013-03-05 DIAGNOSIS — Z5181 Encounter for therapeutic drug level monitoring: Secondary | ICD-10-CM

## 2013-03-05 LAB — CBC
HCT: 39.5 % (ref 39.0–52.0)
Hemoglobin: 13.8 g/dL (ref 13.0–17.0)
MCH: 30.5 pg (ref 26.0–34.0)
MCV: 87.4 fL (ref 78.0–100.0)
Platelets: 177 10*3/uL (ref 150–400)
RBC: 4.52 MIL/uL (ref 4.22–5.81)
WBC: 6.9 10*3/uL (ref 4.0–10.5)

## 2013-03-05 LAB — COMPREHENSIVE METABOLIC PANEL
ALT: 51 U/L (ref 0–53)
Albumin: 4.3 g/dL (ref 3.5–5.2)
Alkaline Phosphatase: 77 U/L (ref 39–117)
CO2: 26 mEq/L (ref 19–32)
Calcium: 9.1 mg/dL (ref 8.4–10.5)
Chloride: 102 mEq/L (ref 96–112)
Glucose, Bld: 83 mg/dL (ref 70–99)
Potassium: 4.4 mEq/L (ref 3.5–5.3)
Sodium: 138 mEq/L (ref 135–145)
Total Bilirubin: 0.8 mg/dL (ref 0.3–1.2)
Total Protein: 6.9 g/dL (ref 6.0–8.3)

## 2013-03-05 LAB — LACTATE DEHYDROGENASE: LDH: 254 U/L — ABNORMAL HIGH (ref 94–250)

## 2013-03-05 LAB — PROTIME-INR: INR: 2.71 — ABNORMAL HIGH (ref <1.50)

## 2013-03-05 LAB — PREALBUMIN: Prealbumin: 24.5 mg/dL (ref 17.0–34.0)

## 2013-03-07 ENCOUNTER — Ambulatory Visit (HOSPITAL_COMMUNITY)
Admission: RE | Admit: 2013-03-07 | Discharge: 2013-03-07 | Disposition: A | Discharge status: Home / Self Care | Payer: Self-pay | Source: Ambulatory Visit | Attending: Internal Medicine | Admitting: Internal Medicine

## 2013-03-07 ENCOUNTER — Ambulatory Visit (HOSPITAL_COMMUNITY): Payer: Self-pay | Admitting: *Deleted

## 2013-03-07 VITALS — BP 76/0

## 2013-03-07 DIAGNOSIS — I472 Ventricular tachycardia: Secondary | ICD-10-CM

## 2013-03-07 DIAGNOSIS — I5022 Chronic systolic (congestive) heart failure: Secondary | ICD-10-CM | POA: Insufficient documentation

## 2013-03-07 DIAGNOSIS — Z7901 Long term (current) use of anticoagulants: Secondary | ICD-10-CM | POA: Insufficient documentation

## 2013-03-07 DIAGNOSIS — I10 Essential (primary) hypertension: Secondary | ICD-10-CM | POA: Insufficient documentation

## 2013-03-07 DIAGNOSIS — I493 Ventricular premature depolarization: Secondary | ICD-10-CM

## 2013-03-07 DIAGNOSIS — Z95811 Presence of heart assist device: Secondary | ICD-10-CM

## 2013-03-07 DIAGNOSIS — I5023 Acute on chronic systolic (congestive) heart failure: Secondary | ICD-10-CM

## 2013-03-07 DIAGNOSIS — R5381 Other malaise: Secondary | ICD-10-CM | POA: Insufficient documentation

## 2013-03-07 DIAGNOSIS — Z95810 Presence of automatic (implantable) cardiac defibrillator: Secondary | ICD-10-CM | POA: Insufficient documentation

## 2013-03-07 DIAGNOSIS — I509 Heart failure, unspecified: Secondary | ICD-10-CM | POA: Insufficient documentation

## 2013-03-07 DIAGNOSIS — I4949 Other premature depolarization: Secondary | ICD-10-CM | POA: Insufficient documentation

## 2013-03-07 DIAGNOSIS — Z95818 Presence of other cardiac implants and grafts: Secondary | ICD-10-CM

## 2013-03-07 DIAGNOSIS — I428 Other cardiomyopathies: Secondary | ICD-10-CM

## 2013-03-07 MED ORDER — HYDRALAZINE HCL 50 MG PO TABS: 50.0000 mg | ORAL_TABLET | Freq: Three times a day (TID) | ORAL | Status: DC

## 2013-03-07 MED ORDER — AMIODARONE HCL 200 MG PO TABS: ORAL_TABLET | ORAL | Status: DC

## 2013-03-07 MED ORDER — WARFARIN SODIUM 5 MG PO TABS: 7.5000 mg | ORAL_TABLET | Freq: Every day | ORAL | Status: DC

## 2013-03-07 NOTE — Patient Instructions (Signed)
1.  Increase amiodarone to 400 mg daily 2.  Take coumadin 7.5 mg daily except 5 mg on MWF 3.  Re-check INR next Tuesday 4.  Return to VAD clinic one month

## 2013-03-13 ENCOUNTER — Other Ambulatory Visit: Payer: BC Managed Care – PPO

## 2013-03-14 ENCOUNTER — Other Ambulatory Visit: Payer: Self-pay | Admitting: Pharmacist

## 2013-03-14 ENCOUNTER — Other Ambulatory Visit (HOSPITAL_COMMUNITY): Payer: Self-pay | Admitting: Internal Medicine

## 2013-03-14 ENCOUNTER — Other Ambulatory Visit: Payer: BC Managed Care – PPO

## 2013-03-14 DIAGNOSIS — Z95811 Presence of heart assist device: Secondary | ICD-10-CM

## 2013-03-14 DIAGNOSIS — I5022 Chronic systolic (congestive) heart failure: Secondary | ICD-10-CM

## 2013-03-14 DIAGNOSIS — Z7901 Long term (current) use of anticoagulants: Secondary | ICD-10-CM

## 2013-03-14 LAB — PROTIME-INR: INR: 1.71 — ABNORMAL HIGH (ref <1.50)

## 2013-03-15 ENCOUNTER — Ambulatory Visit (HOSPITAL_COMMUNITY): Payer: Self-pay | Admitting: *Deleted

## 2013-03-15 ENCOUNTER — Other Ambulatory Visit (HOSPITAL_COMMUNITY): Payer: Self-pay | Admitting: *Deleted

## 2013-03-15 ENCOUNTER — Telehealth (HOSPITAL_COMMUNITY): Payer: Self-pay | Admitting: *Deleted

## 2013-03-15 DIAGNOSIS — Z95811 Presence of heart assist device: Secondary | ICD-10-CM

## 2013-03-15 DIAGNOSIS — Z7901 Long term (current) use of anticoagulants: Secondary | ICD-10-CM

## 2013-03-15 NOTE — Telephone Encounter (Signed)
Called pt per Robert [name], PharmD to clarify coumadin dosing and habits.  Pt states he only has coumadin 5 mg tablets; he uses a pill box and fills weekly; he has not missed any doses, and he has not eaten extra greens. He has been taking 7.5 mg daily except 5 mg on Mon/Friday.  Per Misty Stanley - instructed pt to take 10 mg (2 pills today) and then resume previous dose. He will need INR check next Tuesday 03/20/13. I emailed instructions to him per PharmD request.

## 2013-03-19 ENCOUNTER — Other Ambulatory Visit: Payer: BC Managed Care – PPO

## 2013-03-20 ENCOUNTER — Ambulatory Visit (HOSPITAL_COMMUNITY): Payer: Self-pay | Admitting: *Deleted

## 2013-03-20 ENCOUNTER — Ambulatory Visit (INDEPENDENT_AMBULATORY_CARE_PROVIDER_SITE_OTHER): Payer: BC Managed Care – PPO | Admitting: *Deleted

## 2013-03-20 DIAGNOSIS — Z95811 Presence of heart assist device: Secondary | ICD-10-CM

## 2013-03-20 DIAGNOSIS — Z7901 Long term (current) use of anticoagulants: Secondary | ICD-10-CM

## 2013-03-20 LAB — PROTIME-INR
INR: 3.1 ratio — ABNORMAL HIGH (ref 0.8–1.0)
Prothrombin Time: 31.6 s — ABNORMAL HIGH (ref 10.2–12.4)

## 2013-03-22 ENCOUNTER — Encounter: Payer: BC Managed Care – PPO | Admitting: *Deleted

## 2013-03-26 ENCOUNTER — Ambulatory Visit (HOSPITAL_COMMUNITY): Payer: Self-pay | Admitting: *Deleted

## 2013-03-26 ENCOUNTER — Other Ambulatory Visit (INDEPENDENT_AMBULATORY_CARE_PROVIDER_SITE_OTHER): Payer: Self-pay

## 2013-03-26 DIAGNOSIS — Z7901 Long term (current) use of anticoagulants: Secondary | ICD-10-CM

## 2013-03-26 DIAGNOSIS — Z95818 Presence of other cardiac implants and grafts: Secondary | ICD-10-CM

## 2013-03-26 DIAGNOSIS — Z95811 Presence of heart assist device: Secondary | ICD-10-CM

## 2013-03-26 LAB — PROTIME-INR: INR: 3.2 ratio — ABNORMAL HIGH (ref 0.8–1.0)

## 2013-04-02 ENCOUNTER — Other Ambulatory Visit (HOSPITAL_COMMUNITY): Payer: Self-pay | Admitting: *Deleted

## 2013-04-02 DIAGNOSIS — Z95811 Presence of heart assist device: Secondary | ICD-10-CM

## 2013-04-02 DIAGNOSIS — I10 Essential (primary) hypertension: Secondary | ICD-10-CM

## 2013-04-02 DIAGNOSIS — I428 Other cardiomyopathies: Secondary | ICD-10-CM

## 2013-04-02 DIAGNOSIS — I5023 Acute on chronic systolic (congestive) heart failure: Secondary | ICD-10-CM

## 2013-04-02 MED ORDER — LISINOPRIL 20 MG PO TABS: ORAL_TABLET | ORAL | Status: DC

## 2013-04-04 DIAGNOSIS — I493 Ventricular premature depolarization: Secondary | ICD-10-CM | POA: Insufficient documentation

## 2013-04-04 NOTE — Progress Notes (Addendum)
Patient ID: Robert [name], male   DOB: 05/16/1955, 57 y.o.   MRN: [id number] HPI:  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 status post dual chamber Medtronic ICD implanted April 2011. Cath 2011 showed normal coronaries. He also has a history of VTach. Currently listed as 1b list for heart tx.   Follow up: He notes occasional palpitations and weakness. Otherwise doing ok. No orthopnea, PND or edema. He is trying to drink more fluids.   He was experiencing palpitaions during clinic visit; heart sounds prominent; BP 76; extremites cool and clammy, pulse ox will not pick up HR or O2 sat. EKG obtained revealed sinus rhythm with frequent and consecutive PVCs which resolved with patient lying down; BP 92, pulse ox now picking up HR and sat.  EKG: SR with frequent and consecutive PVCs   LVAD interrogation reveals:  Speed: 10,200  Flow: 7.2  Power: 8.1  PI: 4.3  Alarms: none  Events: 20 - 30 PI events  Fixed speed: 10,200  Low speed limit: 9600  Denies LVAD alarms.  Denies driveline trauma, erythema or drainage.  Denies ICD shocks.  Pt is doing self dressing changes q 3 days.  Dressing changed today using sorbaview dressing; exit site without erythema, tenderness, drainage, or foul odor. Drive line secured with attachment device.  Reports taking Coumadin as prescribed and adherence to anticoagulation based dietary restrictions.  Denies bright red blood per rectum or melena, no dark urine or hematuria.     Past Medical History  Diagnosis Date  . AICD (automatic cardioverter/defibrillator) present   . Hypertension   . Congestive heart failure   . Pneumonia   . Bronchitis     Current Outpatient Prescriptions  Medication Sig Dispense Refill  . allopurinol (ZYLOPRIM) 300 MG tablet TAKE ONE TABLET BY MOUTH ONCE DAILY  30 tablet  6  . amiodarone (PACERONE) 200 MG tablet  Take two tabs daily  60 tablet  5  . amLODipine (NORVASC) 10 MG tablet Take 1 tablet (10 mg total) by mouth daily.  30 tablet  6  . aspirin EC 325 MG tablet Take 1 tablet (325 mg total) by mouth daily.  30 tablet  0  . hydrALAZINE (APRESOLINE) 50 MG tablet Take 1 tablet (50 mg total) by mouth 3 (three) times daily.  90 tablet  6  . pantoprazole (PROTONIX) 40 MG tablet Take 1 tablet (40 mg total) by mouth daily.  90 tablet  3  . amoxicillin (AMOXIL) 500 MG capsule Take 4 tabs 1 hour prior to dental work  4 capsule  3  . enoxaparin (LOVENOX) 100 MG/ML injection Inject 100 mg into the skin every 12 (twelve) hours.       Marland Kitchen lisinopril (PRINIVIL,ZESTRIL) 20 MG tablet Take two tabs in the am and one tab in the pm  90 tablet  6  . warfarin (COUMADIN) 5 MG tablet Take 1.5 tablets (7.5 mg total) by mouth daily. Take 7.5 mg daily except 5 mg MWF  60 tablet  5   No current facility-administered medications for this encounter.   Allergies  Allergen Reactions  . Imdur [Isosorbide] Other (See Comments)    Headache  . Metoprolol     Passes out   REVIEW OF SYSTEMS: All systems negative except as listed in HPI, PMH and Problem list.  LVAD INTERROGATION:  HeartMate II LVAD:  Flow 5.2 speed 10190 power 6.7 PI 6.2  Set speed 10,200  Low speed limit: 9600 Alarms: none Events: PI events (5 - 10 per 24 hr period)  I reviewed the LVAD parameters from today, and compared the results to the patient's prior recorded data. No changes were made.The LVAD is functioning within specified parameters.  The patient performs LVAD self-test daily.  LVAD interrogation was negative for any significant power changes or alarms. He has had a few PI events associated with bending over. LVAD equipment check completed and is in good working order.  Back-up equipment present.   LVAD education done on emergency procedures and precautions and reviewed exit site care.    Filed Vitals:   03/07/13 1051  BP: 76/0  Last weight:  227 lbs  Physical Exam: GENERAL: Clammy and lightheaded at times. Otherwise no distress HEENT: normal  NECK: Supple, JVP flat .  2+ bilaterally, no bruits. No lymphadenopathy or thyromegaly appreciated.   CARDIAC:  Mechanical heart sounds with LVAD hum present.  LUNGS:  Clear to auscultation bilaterally.  ABDOMEN:  Soft, round, nontender, positive bowel sounds x4.     LVAD exit site: Dressing dry and intact.  No erythema or drainage.  Stabilization device present and accurately applied.  Driveline dressing is being changed q 3 days due to moisture under the sorbaview dressing.  EXTREMITIES:  Warm and dry, no cyanosis, clubbing, rash or edema  NEUROLOGIC:  Alert and oriented x 4.  Gait steady.  No aphasia.  No dysarthria.  Affect pleasant.     ASSESSMENT AND PLAN:   1) Chronic systolic HF, s/p HMII LVAD implant 05/2012 - Overall doing well but clearly symptomatic with PVCs. Suspect these may be suction events. VAD speed turned down from 10,200 to 9800. Will also increase amio to 400 daily. - [name] is currently on the 1b list at [facility] for transplant. He has NYHA II symptoms on LVAD support and his volume status is stable. He is currently not on any diuretics and will continue to keep off. He is aware to call if weight starts trendin up. - He is not on BB d/t intolerance in the past. Will continue ACE-I at current dose 40/20. Abnormal dosing, but patient has had multiple med changes d/t HTN and this regimen has worked the best. He still has elevated MAP, but much improved. Will also continue hydralazine 25 mg TID and amlodipine 10 mg daily. If MAP elevated next visit will increase hydralazine. Not on IMDUR d/t sever headaches. 2) PVCs Pump speed decreased to 10,000 RPM then 9800 RPM with following parameters:  Speed 9800  Flow 5.4  PI 6.2  Power 6.4  MAP: 96  Will increase Amiodarone to 400 mg daily.  Primary and back up controllers reprogrammed as below: 3) HTN - As above will continue  current regimen 4) Anticoagulation -Consulted pharmacy for coumadin dosing with amiodarone dose change. Pt will take coumadin 7.5 mg daily except 5 mg on MWF; INR check Tuesday 03/13/13.   F/U 6-8 weeks  Arvilla Meres MD 9:56 PM

## 2013-04-04 NOTE — Addendum Note (Signed)
Encounter addended by: Dolores Patty, MD on: 04/04/2013 10:13 PM<BR>     Documentation filed: Charges VN, Problem List, Visit Diagnoses, Follow-up Section, LOS Section, Notes Section, Clinical Notes

## 2013-04-09 ENCOUNTER — Encounter: Payer: BC Managed Care – PPO | Admitting: *Deleted

## 2013-04-10 ENCOUNTER — Other Ambulatory Visit (HOSPITAL_COMMUNITY): Payer: Self-pay | Admitting: *Deleted

## 2013-04-10 DIAGNOSIS — Z7901 Long term (current) use of anticoagulants: Secondary | ICD-10-CM

## 2013-04-10 DIAGNOSIS — Z95811 Presence of heart assist device: Secondary | ICD-10-CM

## 2013-04-11 ENCOUNTER — Ambulatory Visit (HOSPITAL_COMMUNITY)
Admission: RE | Admit: 2013-04-11 | Discharge: 2013-04-11 | Disposition: A | Discharge status: Home / Self Care | Payer: BC Managed Care – PPO | Source: Ambulatory Visit | Attending: Internal Medicine | Admitting: Internal Medicine

## 2013-04-11 ENCOUNTER — Ambulatory Visit (HOSPITAL_COMMUNITY): Payer: Self-pay | Admitting: *Deleted

## 2013-04-11 VITALS — BP 94/0 | HR 94 | Ht 74.0 in | Wt 239.8 lb

## 2013-04-11 DIAGNOSIS — I4949 Other premature depolarization: Secondary | ICD-10-CM

## 2013-04-11 DIAGNOSIS — Z95811 Presence of heart assist device: Secondary | ICD-10-CM

## 2013-04-11 DIAGNOSIS — Z9889 Other specified postprocedural states: Secondary | ICD-10-CM | POA: Insufficient documentation

## 2013-04-11 DIAGNOSIS — Z95818 Presence of other cardiac implants and grafts: Secondary | ICD-10-CM

## 2013-04-11 DIAGNOSIS — I5022 Chronic systolic (congestive) heart failure: Secondary | ICD-10-CM

## 2013-04-11 DIAGNOSIS — I10 Essential (primary) hypertension: Secondary | ICD-10-CM | POA: Insufficient documentation

## 2013-04-11 DIAGNOSIS — Z95810 Presence of automatic (implantable) cardiac defibrillator: Secondary | ICD-10-CM | POA: Insufficient documentation

## 2013-04-11 DIAGNOSIS — I493 Ventricular premature depolarization: Secondary | ICD-10-CM

## 2013-04-11 DIAGNOSIS — I509 Heart failure, unspecified: Secondary | ICD-10-CM | POA: Insufficient documentation

## 2013-04-11 DIAGNOSIS — I428 Other cardiomyopathies: Secondary | ICD-10-CM | POA: Insufficient documentation

## 2013-04-11 DIAGNOSIS — Z7901 Long term (current) use of anticoagulants: Secondary | ICD-10-CM

## 2013-04-11 LAB — BASIC METABOLIC PANEL
BUN: 15 mg/dL (ref 6–23)
CALCIUM: 8.6 mg/dL (ref 8.4–10.5)
CO2: 27 meq/L (ref 19–32)
Chloride: 103 mEq/L (ref 96–112)
Creatinine, Ser: 1.26 mg/dL (ref 0.50–1.35)
GFR calc Af Amer: 72 mL/min — ABNORMAL LOW (ref >90)
GFR calc non Af Amer: 62 mL/min — ABNORMAL LOW (ref >90)
Glucose, Bld: 83 mg/dL (ref 70–99)
POTASSIUM: 4.2 meq/L (ref 3.7–5.3)
SODIUM: 144 meq/L (ref 137–147)

## 2013-04-11 LAB — CBC
HCT: 41.3 % (ref 39.0–52.0)
Hemoglobin: 13.9 g/dL (ref 13.0–17.0)
MCH: 30.3 pg (ref 26.0–34.0)
MCHC: 33.7 g/dL (ref 30.0–36.0)
MCV: 90.2 fL (ref 78.0–100.0)
Platelets: 156 10*3/uL (ref 150–400)
RBC: 4.58 MIL/uL (ref 4.22–5.81)
RDW: 14.8 % (ref 11.5–15.5)
WBC: 6.5 10*3/uL (ref 4.0–10.5)

## 2013-04-11 LAB — PROTIME-INR
INR: 3.29 — AB (ref 0.00–1.49)
PROTHROMBIN TIME: 32.3 s — AB (ref 11.6–15.2)

## 2013-04-11 LAB — LACTATE DEHYDROGENASE: LDH: 315 U/L — AB (ref 94–250)

## 2013-04-11 NOTE — Progress Notes (Signed)
Patient ID: Robert [name], male   DOB: [birth year], 58 y.o.   MRN: [id number]  HPI:  Mr. 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 status post dual chamber Medtronic ICD implanted April 2011. Cath 2011 showed normal coronaries. He also has a history of VTach. Currently listed as 1b list for heart tx.   Follow up: At last visit he was having frequent PVCs and PI events which seemed positional and concerning for suction events. VAD speed turned down from 10,200-> 9,800. Amio increased back up to 400 daily from 200 daily. He has been feeling much better. Does notice some PVCs and dyspnea when he works hard but otherwise feels well. Not taking any lasix. No orthopnea or PND.   Denies LVAD alarms.  Denies driveline trauma, erythema or drainage.  Denies ICD shocks.  Pt is doing self dressing changes q 3 days.  Dressing changed today using sorbaview dressing; exit site without erythema, tenderness, drainage, or foul odor. Drive line secured with attachment device.  Reports taking Coumadin as prescribed and adherence to anticoagulation based dietary restrictions.  Denies bright red blood per rectum or melena, no dark urine or hematuria.     Past Medical History  Diagnosis Date  . AICD (automatic cardioverter/defibrillator) present   . Hypertension   . Congestive heart failure   . Pneumonia   . Bronchitis     Current Outpatient Prescriptions  Medication Sig Dispense Refill  . allopurinol (ZYLOPRIM) 300 MG tablet TAKE ONE TABLET BY MOUTH ONCE DAILY  30 tablet  6  . amiodarone (PACERONE) 200 MG tablet Take two tabs daily  60 tablet  5  . amLODipine (NORVASC) 10 MG tablet Take 1 tablet (10 mg total) by mouth daily.  30 tablet  6  . aspirin EC 325 MG tablet Take 1 tablet (325 mg total) by mouth daily.  30 tablet  0  . hydrALAZINE (APRESOLINE) 50 MG tablet Take 1 tablet (50 mg total) by  mouth 3 (three) times daily.  90 tablet  6  . lisinopril (PRINIVIL,ZESTRIL) 20 MG tablet Take two tabs in the am and one tab in the pm  90 tablet  6  . warfarin (COUMADIN) 5 MG tablet Take 1.5 tablets (7.5 mg total) by mouth daily. Take 7.5 mg daily except 5 mg MWF  60 tablet  5  . amoxicillin (AMOXIL) 500 MG capsule Take 4 tabs 1 hour prior to dental work  4 capsule  3  . enoxaparin (LOVENOX) 100 MG/ML injection Inject 100 mg into the skin every 12 (twelve) hours.       . pantoprazole (PROTONIX) 40 MG tablet Take 1 tablet (40 mg total) by mouth daily.  90 tablet  3   No current facility-administered medications for this encounter.   Allergies  Allergen Reactions  . Imdur [Isosorbide] Other (See Comments)    Headache  . Metoprolol     Passes out   REVIEW OF SYSTEMS: All systems negative except as listed in HPI, PMH and Problem list.  LVAD INTERROGATION:   Speed: 9800  Flow: 6.2  Power: 7.0  PI: 6.1  Alarms: none  Events: PI events  Fixed speed: 9800  Low speed limit: 9200   Filed Vitals:   04/11/13 1343  BP: 94/0  Pulse: 94  Height: 6\' 2"  (1.88 m)  Weight: 239 lb 12.8 oz (108.773 kg)  SpO2: 98%    Physical Exam: GENERAL: Looks good. No distress HEENT: normal  NECK: Supple, JVP 8.  2+ bilaterally, no bruits. No lymphadenopathy or thyromegaly appreciated.   CARDIAC:  Mechanical heart sounds with LVAD hum present.  LUNGS:  Clear to auscultation bilaterally.  ABDOMEN:  Soft, nontender, positive bowel sounds x4.     LVAD exit site: Dressing dry and intact.  No erythema or drainage.  Stabilization device present and accurately applied.  Driveline dressing is being changed q 3 days due to moisture under the sorbaview dressing.  EXTREMITIES:  Warm and dry, no cyanosis, clubbing, rash or 1+ edema bilaterally NEUROLOGIC:  Alert and oriented x 4.  Gait steady.  No aphasia.  No dysarthria.  Affect pleasant.     ASSESSMENT AND PLAN:   1) Chronic systolic HF, s/p HMII LVAD  implant 05/2012 2) PVCs 3) HTN 4) Anticoagulation with coumadin 5) H/o VT on amio  He is doing very well. Volume status up slightly. Will have him take one dose of lasix today. Reinforced need for daily weights and reviewed use of sliding scale diuretics.MAPs OK. PVCs improved with dropping pump speed and increasing amio. We considered going to 600 but native heart sound prominent so decided against this. Can do ramp echo if needed. Continue to await transplant. Labs today.   Arvilla Meresaniel Normand Damron MD 7:25 PM

## 2013-04-11 NOTE — Patient Instructions (Signed)
1.  Take Lasix 20 mg today with K-Dur one dose 2.  Will call you with lab results and coumadin dosing 3.  Return to clinic 6 weeks

## 2013-04-11 NOTE — Progress Notes (Signed)
Symptom  Yes  No  Details   Angina       x Activity:   Claudication       x How far:   Syncope       x When:   Occasional dizziness with orthostatic changes  Stroke       x   Orthopnea       x How many pillows:  2 for comfort  PND       x How often:  CPAP       N/A How many hrs:   Pedal edema       x   Abd fullness       x   N&V       x   Diaphoresis       x When:  Bleeding      x   Urine color    light yellow  SOB       x Activity:   Palpitations       x     When: decreased; but still with activities   ICD shock       x   Hospitlizaitons       x When/where/why:  ED visit       x When/where/why:  Other MD       x      When/who/why:  Activity        Decreased   Fluid      No limitations  Diet      No limitations    Vital signs: HR:  94 MAP BP:  94 O2 Sat:  98 Wt:  239.8  Last wt:  234.4 Ht:  6'2"  LVAD interrogation reveals:  Speed:  9800 Flow:   6.2 Power:  7.0 PI:  6.1 Alarms:  none Events:  PI events Fixed speed:  9800 Low speed limit:  9200  LVAD exit site:  Well healed and incorporated. The velour is fully implanted at exit site. Dressing dry and intact. No erythema or drainage. Stabilization device present and accurately applied. Driveline dressing is being changed  weekly per sterile technique using  Sorbaview dressing with biopatch on exit site. Pt denies fever or chills. Pt state they have adequate dressing supplies at home.  Pt/caregiver deny any alarms or VAD equipment issues.  Pt is completing weekly and monthly maintenance for LVAD equipment. Reviewed calibrating batteries including recognizing prompt, steps to complete, and rationale for doing so. Pt verbalized understanding of same.  LVAD equipment check completed and is in good working order. Back-up equipment present. LVAD education done on emergency procedures and precautions and reviewed exit site care.

## 2013-04-18 ENCOUNTER — Ambulatory Visit (HOSPITAL_COMMUNITY): Payer: Self-pay | Admitting: *Deleted

## 2013-04-18 ENCOUNTER — Ambulatory Visit (INDEPENDENT_AMBULATORY_CARE_PROVIDER_SITE_OTHER): Payer: BC Managed Care – PPO | Admitting: *Deleted

## 2013-04-18 DIAGNOSIS — Z95811 Presence of heart assist device: Secondary | ICD-10-CM

## 2013-04-18 DIAGNOSIS — Z7901 Long term (current) use of anticoagulants: Secondary | ICD-10-CM

## 2013-04-18 LAB — PROTIME-INR
INR: 5.3 ratio — AB (ref 0.8–1.0)
Prothrombin Time: 54.3 s (ref 10.2–12.4)

## 2013-04-23 ENCOUNTER — Ambulatory Visit (INDEPENDENT_AMBULATORY_CARE_PROVIDER_SITE_OTHER): Payer: BC Managed Care – PPO | Admitting: *Deleted

## 2013-04-23 ENCOUNTER — Ambulatory Visit (HOSPITAL_COMMUNITY): Payer: Self-pay | Admitting: *Deleted

## 2013-04-23 DIAGNOSIS — Z95811 Presence of heart assist device: Secondary | ICD-10-CM

## 2013-04-23 DIAGNOSIS — Z7901 Long term (current) use of anticoagulants: Secondary | ICD-10-CM

## 2013-04-23 LAB — PROTIME-INR
INR: 2.1 ratio — ABNORMAL HIGH (ref 0.8–1.0)
PROTHROMBIN TIME: 22.3 s — AB (ref 10.2–12.4)

## 2013-04-30 ENCOUNTER — Other Ambulatory Visit (HOSPITAL_COMMUNITY): Payer: Self-pay | Admitting: Cardiology

## 2013-04-30 DIAGNOSIS — I472 Ventricular tachycardia, unspecified: Secondary | ICD-10-CM

## 2013-04-30 DIAGNOSIS — I10 Essential (primary) hypertension: Secondary | ICD-10-CM

## 2013-04-30 DIAGNOSIS — I5023 Acute on chronic systolic (congestive) heart failure: Secondary | ICD-10-CM

## 2013-04-30 DIAGNOSIS — Z95811 Presence of heart assist device: Secondary | ICD-10-CM

## 2013-04-30 DIAGNOSIS — I272 Pulmonary hypertension, unspecified: Secondary | ICD-10-CM

## 2013-04-30 DIAGNOSIS — I428 Other cardiomyopathies: Secondary | ICD-10-CM

## 2013-04-30 MED ORDER — ASPIRIN EC 325 MG PO TBEC: 325.0000 mg | DELAYED_RELEASE_TABLET | Freq: Every day | ORAL | Status: DC

## 2013-04-30 MED ORDER — LISINOPRIL 20 MG PO TABS: ORAL_TABLET | ORAL | Status: DC

## 2013-04-30 MED ORDER — AMLODIPINE BESYLATE 10 MG PO TABS: 10.0000 mg | ORAL_TABLET | Freq: Every day | ORAL | Status: DC

## 2013-04-30 MED ORDER — ALLOPURINOL 300 MG PO TABS: ORAL_TABLET | ORAL | Status: DC

## 2013-04-30 MED ORDER — HYDRALAZINE HCL 50 MG PO TABS: 50.0000 mg | ORAL_TABLET | Freq: Three times a day (TID) | ORAL | Status: DC

## 2013-04-30 MED ORDER — WARFARIN SODIUM 5 MG PO TABS: 7.5000 mg | ORAL_TABLET | Freq: Every day | ORAL | Status: DC

## 2013-04-30 MED ORDER — AMIODARONE HCL 200 MG PO TABS: ORAL_TABLET | ORAL | Status: DC

## 2013-04-30 NOTE — Telephone Encounter (Signed)
Pt called to request all meds be sent back to [facility]

## 2013-05-02 ENCOUNTER — Ambulatory Visit (INDEPENDENT_AMBULATORY_CARE_PROVIDER_SITE_OTHER): Payer: BC Managed Care – PPO | Admitting: *Deleted

## 2013-05-02 ENCOUNTER — Ambulatory Visit (HOSPITAL_COMMUNITY): Payer: Self-pay | Admitting: *Deleted

## 2013-05-02 DIAGNOSIS — Z95811 Presence of heart assist device: Secondary | ICD-10-CM

## 2013-05-02 DIAGNOSIS — Z7901 Long term (current) use of anticoagulants: Secondary | ICD-10-CM

## 2013-05-02 LAB — PROTIME-INR
INR: 2.4 ratio — AB (ref 0.8–1.0)
Prothrombin Time: 25 s — ABNORMAL HIGH (ref 10.2–12.4)

## 2013-05-14 ENCOUNTER — Encounter: Payer: Self-pay | Admitting: Licensed Clinical Social Worker

## 2013-05-14 NOTE — Progress Notes (Signed)
Patient came to clinic today to request assistance of CSW with paperwork for disability application. Patient states that Ms Sonia Baller [phone] (disability firm) is assisting him with application for SSD/SSI. CSW will contact and assist patient with needed information for application. [name], LCSW [phone]

## 2013-05-15 ENCOUNTER — Ambulatory Visit (INDEPENDENT_AMBULATORY_CARE_PROVIDER_SITE_OTHER): Payer: BC Managed Care – PPO | Admitting: *Deleted

## 2013-05-15 ENCOUNTER — Ambulatory Visit (HOSPITAL_COMMUNITY): Payer: Self-pay | Admitting: *Deleted

## 2013-05-15 ENCOUNTER — Telehealth: Payer: Self-pay | Admitting: Licensed Clinical Social Worker

## 2013-05-15 DIAGNOSIS — Z7901 Long term (current) use of anticoagulants: Secondary | ICD-10-CM

## 2013-05-15 DIAGNOSIS — Z95811 Presence of heart assist device: Secondary | ICD-10-CM

## 2013-05-15 LAB — PROTIME-INR
INR: 3.5 ratio — AB (ref 0.8–1.0)
PROTHROMBIN TIME: 35.9 s — AB (ref 10.2–12.4)

## 2013-05-15 NOTE — Telephone Encounter (Signed)
CSW spoke with Ms. Robert [name] who stated that she is assisting patient with SSD application. She stated concerns about social support needs of patient and asked for further follow up with patient. CSW noted currently assisting patient with financial resources and supportive counseling.  CSW contacted patient to arrange follow up visit next week (05/21/2013 @ 10am) to provide support and resources. CSW will follow up as needed. [name], LCSW [phone]

## 2013-05-16 ENCOUNTER — Other Ambulatory Visit: Payer: BC Managed Care – PPO

## 2013-05-17 ENCOUNTER — Encounter (HOSPITAL_COMMUNITY): Payer: Self-pay

## 2013-05-17 ENCOUNTER — Telehealth (HOSPITAL_COMMUNITY): Payer: Self-pay

## 2013-05-17 NOTE — Telephone Encounter (Signed)
Patient called to make 6 week appointment with VAD clinic.  Scheduled for 10:00, will send reminder letter for appointment. [address] Filter

## 2013-05-21 ENCOUNTER — Telehealth: Payer: Self-pay | Admitting: Licensed Clinical Social Worker

## 2013-05-21 NOTE — Telephone Encounter (Signed)
CSW received call from patient stating that he is unable to make appointment with CSW today and will call later in the week to reschedule. [name], LCSW [phone]

## 2013-05-23 ENCOUNTER — Encounter: Payer: Self-pay | Admitting: Licensed Clinical Social Worker

## 2013-05-23 NOTE — Progress Notes (Signed)
CSW met with patient to discuss financial challenges and assist with support and coping strategies. Patient reports he has an appointment tomorrow at [facility] for application for assistance with his mortgage. Patient reports that he is 2 months behind in his monthly payments. Patient also reported that he has assistance pending for payment on his back taxes which he reports he preventing him from obtaining any disability. He currently has no income and has food stamps and health insurance through the healthcare act. Patient appeared very down and frustrated with the system. He stated that "if I lied and was a schemer I would have more help". Patient reports "I am trying to do the right thing but seem to be turned down everywhere". CSW provided supportive intervention as well as some additional resources for financial assistance. CSW will continue to follow for support and resources. Raquel Sarna, [place]

## 2013-05-29 ENCOUNTER — Encounter (HOSPITAL_COMMUNITY): Payer: Self-pay

## 2013-05-29 ENCOUNTER — Telehealth (HOSPITAL_COMMUNITY): Payer: Self-pay | Admitting: *Deleted

## 2013-05-29 ENCOUNTER — Inpatient Hospital Stay (HOSPITAL_COMMUNITY): Admission: RE | Admit: 2013-05-29 | Payer: Self-pay | Source: Ambulatory Visit

## 2013-06-05 ENCOUNTER — Inpatient Hospital Stay (HOSPITAL_COMMUNITY): Admission: RE | Admit: 2013-06-05 | Payer: Self-pay | Source: Ambulatory Visit

## 2013-06-05 ENCOUNTER — Other Ambulatory Visit (HOSPITAL_COMMUNITY): Payer: Self-pay | Admitting: *Deleted

## 2013-06-05 ENCOUNTER — Encounter (HOSPITAL_COMMUNITY): Payer: Self-pay

## 2013-06-05 ENCOUNTER — Ambulatory Visit: Payer: Self-pay | Admitting: *Deleted

## 2013-06-05 ENCOUNTER — Telehealth (HOSPITAL_COMMUNITY): Payer: Self-pay | Admitting: *Deleted

## 2013-06-05 ENCOUNTER — Other Ambulatory Visit (INDEPENDENT_AMBULATORY_CARE_PROVIDER_SITE_OTHER): Payer: BC Managed Care – PPO | Admitting: *Deleted

## 2013-06-05 DIAGNOSIS — Z7901 Long term (current) use of anticoagulants: Secondary | ICD-10-CM

## 2013-06-05 DIAGNOSIS — Z95811 Presence of heart assist device: Secondary | ICD-10-CM

## 2013-06-05 LAB — PROTIME-INR
INR: 3.9 ratio — ABNORMAL HIGH (ref 0.8–1.0)
Prothrombin Time: 40.3 s — ABNORMAL HIGH (ref 10.2–12.4)

## 2013-06-05 NOTE — Telephone Encounter (Signed)
Pt unable to keep clinic appt today due to inclement weather. VAD clinic appt rescheduled for 06/05/13 along with labs, including INR. Pt verbalized understanding of same.

## 2013-06-05 NOTE — Telephone Encounter (Signed)
Called pt after being no show at clinic appt; pt states he wasn't aware of appt.  Instructed pt to get INR checked today at [facility] lab; stressed importance of getting INR checked today.  Also instructed pt to call VAD clinic and re-schedule clinic appt; pt verbalized agreement to above.

## 2013-06-12 ENCOUNTER — Encounter: Payer: Self-pay | Admitting: Licensed Clinical Social Worker

## 2013-06-12 ENCOUNTER — Ambulatory Visit (HOSPITAL_COMMUNITY): Payer: Self-pay | Admitting: *Deleted

## 2013-06-12 ENCOUNTER — Ambulatory Visit (HOSPITAL_COMMUNITY)
Admission: RE | Admit: 2013-06-12 | Discharge: 2013-06-12 | Disposition: A | Discharge status: Home / Self Care | Payer: BC Managed Care – PPO | Source: Ambulatory Visit | Attending: Internal Medicine | Admitting: Internal Medicine

## 2013-06-12 VITALS — HR 74 | Wt 242.0 lb

## 2013-06-12 DIAGNOSIS — Z95811 Presence of heart assist device: Secondary | ICD-10-CM

## 2013-06-12 DIAGNOSIS — I10 Essential (primary) hypertension: Secondary | ICD-10-CM | POA: Insufficient documentation

## 2013-06-12 DIAGNOSIS — I428 Other cardiomyopathies: Secondary | ICD-10-CM

## 2013-06-12 DIAGNOSIS — Z7901 Long term (current) use of anticoagulants: Secondary | ICD-10-CM | POA: Insufficient documentation

## 2013-06-12 DIAGNOSIS — I5022 Chronic systolic (congestive) heart failure: Secondary | ICD-10-CM

## 2013-06-12 DIAGNOSIS — I5023 Acute on chronic systolic (congestive) heart failure: Secondary | ICD-10-CM | POA: Insufficient documentation

## 2013-06-12 LAB — COMPREHENSIVE METABOLIC PANEL
ALK PHOS: 85 U/L (ref 39–117)
ALT: 31 U/L (ref 0–53)
AST: 27 U/L (ref 0–37)
Albumin: 3.8 g/dL (ref 3.5–5.2)
BILIRUBIN TOTAL: 0.6 mg/dL (ref 0.3–1.2)
BUN: 13 mg/dL (ref 6–23)
CO2: 26 meq/L (ref 19–32)
Calcium: 8.8 mg/dL (ref 8.4–10.5)
Chloride: 104 mEq/L (ref 96–112)
Creatinine, Ser: 1.28 mg/dL (ref 0.50–1.35)
GFR, EST AFRICAN AMERICAN: 70 mL/min — AB (ref >90)
GFR, EST NON AFRICAN AMERICAN: 61 mL/min — AB (ref >90)
GLUCOSE: 81 mg/dL (ref 70–99)
POTASSIUM: 3.7 meq/L (ref 3.7–5.3)
Sodium: 142 mEq/L (ref 137–147)
TOTAL PROTEIN: 6.8 g/dL (ref 6.0–8.3)

## 2013-06-12 LAB — PROTIME-INR
INR: 2.11 — ABNORMAL HIGH (ref 0.00–1.49)
Prothrombin Time: 23 seconds — ABNORMAL HIGH (ref 11.6–15.2)

## 2013-06-12 LAB — CBC
HCT: 40.7 % (ref 39.0–52.0)
HEMOGLOBIN: 13.9 g/dL (ref 13.0–17.0)
MCH: 30.3 pg (ref 26.0–34.0)
MCHC: 34.2 g/dL (ref 30.0–36.0)
MCV: 88.9 fL (ref 78.0–100.0)
Platelets: 173 10*3/uL (ref 150–400)
RBC: 4.58 MIL/uL (ref 4.22–5.81)
RDW: 14.8 % (ref 11.5–15.5)
WBC: 6.6 10*3/uL (ref 4.0–10.5)

## 2013-06-12 LAB — LACTATE DEHYDROGENASE: LDH: 257 U/L — AB (ref 94–250)

## 2013-06-12 LAB — PREALBUMIN: Prealbumin: 25.5 mg/dL (ref 17.0–34.0)

## 2013-06-12 LAB — PRO B NATRIURETIC PEPTIDE: Pro B Natriuretic peptide (BNP): 428.6 pg/mL — ABNORMAL HIGH (ref 0–125)

## 2013-06-12 MED ORDER — HYDRALAZINE HCL 25 MG PO TABS: 75.0000 mg | ORAL_TABLET | Freq: Two times a day (BID) | ORAL | Status: DC

## 2013-06-12 NOTE — Progress Notes (Signed)
CSW met with patient to provide supportive counseling. Patient spoke at length about his current health status and awaiting transplant. Patient stated that he has gained some weight mostly due to eating and then sleeping "rather than getting up and moving around". Patient states with the warmer weather he is hopeful to get outside a little more and enjoy the fresh air. Patient continues to pick his 58 yo son up from school and spend time every afternoon. He spoke of the challenges with a 22 yo autistic son and how he worries about his future and ability to live independently. Patient mentioned that he has a follow up appointment in April with SSI to complete application process.  CSW continues to provide supportive counseling and encourages patient to call if needed. Raquel Sarna, [place]

## 2013-06-12 NOTE — Progress Notes (Signed)
Symptom  Yes  No  Details   Angina       x Activity:   Claudication       x How far:   Syncope       x When:   Occasional dizziness with orthostatic changes, but improving  Stroke       x   Orthopnea       x How many pillows:  2 for comfort  PND       x How often:  CPAP       N/A How many hrs:   Pedal edema       x   Abd fullness       x   N&V       x   Diaphoresis       x When:  Bleeding      x   Urine color    light yellow  SOB       x Activity:   Palpitations       x     When: decreased in amount; with activities  ICD shock       x   Hospitlizaitons       x When/where/why:  ED visit       x When/where/why:  Other MD       x      When/who/why:  Activity        Decreased   Fluid      No limitations  Diet      No limitations    Vital signs: HR:  74 MAP BP:  106 O2 Sat:  99 Wt:  242.4  Last wt:  239.8 Ht:  6'2"  LVAD interrogation reveals:  Speed:  9800 Flow:   6.8 Power:  7.4 PI:  5.4 Alarms:  Few low voltage advisories Events:  5 - 10 PI events Fixed speed:  9800 Low speed limit:  9200  LVAD exit site:  Well healed and incorporated. The velour is fully implanted at exit site. Dressing dry and intact. No erythema or drainage. Stabilization device present and accurately applied. Driveline dressing is being changed weekly per sterile technique using  Sorbaview dressing with biopatch on exit site. Pt denies fever or chills. Pt state they have adequate dressing supplies at home.  Pt/caregiver deny any alarms or VAD equipment issues.  Pt is completing weekly and monthly maintenance for LVAD equipment. Reviewed calibrating batteries including recognizing prompt, steps to complete, and rationale for doing so. Pt verbalized understanding of same.  LVAD equipment check completed and is in good working order. Back-up equipment present. LVAD education done on emergency procedures and precautions and reviewed exit site care.   1 year Intermacs follow up completed today including  QOL, KCCQ-12, and neurocognitive trailmaking. 6 minute walk completed; pt walked 1420 feet without symptoms.

## 2013-06-12 NOTE — Progress Notes (Signed)
Patient ID: [name], male   DOB: 05-06-1955, 58 y.o.   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 status post dual chamber Medtronic ICD implanted April 2011. Cath 2011 showed normal coronaries. He also has a history of VTach. Currently listed as 1b list for heart tx.   He returns for follow up. Last visit amiodarone was increased to 400 mg daily. Weight at home 235 pounds. He has not required any lasix. Denies SOB/PND/Orthopnea. Compliant with medications. Appetite up. Not exercising.     Denies LVAD alarms.  Denies driveline trauma, erythema or drainage.  Denies ICD shocks.  Pt is doing self dressing changes q 3 days.  Dressing changed today using sorbaview dressing; exit site without erythema, tenderness, drainage, or foul odor. Drive line secured with attachment device.  Reports taking Coumadin as prescribed and adherence to anticoagulation based dietary restrictions.  Denies bright red blood per rectum or melena, no dark urine or hematuria.     Past Medical History  Diagnosis Date  . AICD (automatic cardioverter/defibrillator) present   . Hypertension   . Congestive heart failure   . Pneumonia   . Bronchitis     Current Outpatient Prescriptions  Medication Sig Dispense Refill  . allopurinol (ZYLOPRIM) 300 MG tablet TAKE ONE TABLET BY MOUTH ONCE DAILY  30 tablet  6  . amiodarone (PACERONE) 200 MG tablet Take two tabs daily  60 tablet  6  . amLODipine (NORVASC) 10 MG tablet Take 1 tablet (10 mg total) by mouth daily.  30 tablet  6  . aspirin EC 325 MG tablet Take 1 tablet (325 mg total) by mouth daily.  30 tablet  6  . hydrALAZINE (APRESOLINE) 50 MG tablet Take 1 tablet (50 mg total) by mouth 3 (three) times daily.  90 tablet  6  . lisinopril (PRINIVIL,ZESTRIL) 20 MG tablet Take two tabs in the am and one tab in the pm  90 tablet  6  .  pantoprazole (PROTONIX) 40 MG tablet Take 1 tablet (40 mg total) by mouth daily.  90 tablet  3  . warfarin (COUMADIN) 5 MG tablet Take 1.5 tablets (7.5 mg total) by mouth daily. Take 7.5 mg daily except 5 mg MWF  60 tablet  5  . amoxicillin (AMOXIL) 500 MG capsule Take 4 tabs 1 hour prior to dental work  4 capsule  3   No current facility-administered medications for this encounter.   Allergies  Allergen Reactions  . Imdur [Isosorbide] Other (See Comments)    Headache  . Metoprolol     Passes out   REVIEW OF SYSTEMS: All systems negative except as listed in HPI, PMH and Problem list.  LVAD INTERROGATION:   Speed: 9800  Flow: 6.4 Power: 7.6.9 PI: 6.6 Alarms: none  Events: 5-10 PI events  Fixed speed: 9800  Low speed limit: 9200   Filed Vitals:   06/12/13 1144  Pulse: 74  Weight: 242 lb (109.77 kg)  SpO2: 99%    Physical Exam: GENERAL: Looks good. No distress HEENT: normal  NECK: Supple, JVP 5-6   2+ bilaterally, no bruits. No lymphadenopathy or thyromegaly appreciated.   CARDIAC:  Mechanical heart sounds with LVAD hum present.  LUNGS:  Clear to auscultation bilaterally.  ABDOMEN:  Soft, nontender, positive bowel sounds x4.     LVAD exit site:  Dressing dry and intact.  No erythema or drainage.  Stabilization device present and accurately applied.  Driveline dressing is being changed q 3 days due to moisture under the sorbaview dressing.  EXTREMITIES:  Warm and dry, no cyanosis, clubbing, rash. R and LLE trace edema NEUROLOGIC:  Alert and oriented x 4.  Gait steady.  No aphasia.  No dysarthria.  Affect pleasant.     ASSESSMENT AND PLAN:  1) Chronic systolic HF, s/p HMII LVAD implant 05/2012. Currently 1B at [facility]. Few PI events noted.  Volume status stable despite weight gain. He is not on bb due to syncope. He is not imdur due to headache. Continue lisinopril 40 mg in am and 20 mg in pm. Increase hydralazine 75 mg tid.  2) PVCs--> on amiodarone 400 mg daily Check CMET  today  3) HTN-- MAP 106 . Increase hydralazine to 75 mg tid. Continue amlodipine 10 mg daily, lisinopril 40 mg in am and 20 mg in pm. .  4) Anticoagulation with coumadin 5) H/o VT on amio. Cut back amiodarone to 200 mg daily.  6) Gout -- on allopurinol   Follow up in 6 weeks.   [name],AMY NP-C  11:50 AM  Patient seen and examined with Robert Becket, NP. We discussed all aspects of the encounter. I agree with the assessment and plan as stated above.   He is doing quite well with VAD therapy. MAPs high. Agree with increasing hydralazine. Will also cut amio down cautiously. VAD interrogation looks good.  Check labs.   Daniel Bensimhon,MD 4:08 PM

## 2013-06-18 ENCOUNTER — Telehealth: Payer: Self-pay | Admitting: *Deleted

## 2013-06-18 NOTE — Telephone Encounter (Signed)
Called pt per Dr. Gala Romney - instructed him to decrease Amiodarone to 200 mg daily. If he experiences any palpitations, asked pt to call VAD coordinator. Pt asked if he could start antidepressant as discussed last clinic visit. I will ask Tonye Becket, NP or Dr. Gala Romney and get back to pt. Pt verbalized understanding of above.

## 2013-06-19 ENCOUNTER — Telehealth (HOSPITAL_COMMUNITY): Payer: Self-pay | Admitting: *Deleted

## 2013-06-19 DIAGNOSIS — F32A Depression, unspecified: Secondary | ICD-10-CM

## 2013-06-19 DIAGNOSIS — Z95811 Presence of heart assist device: Secondary | ICD-10-CM

## 2013-06-19 DIAGNOSIS — F329 Major depressive disorder, single episode, unspecified: Secondary | ICD-10-CM

## 2013-06-19 MED ORDER — CITALOPRAM HYDROBROMIDE 20 MG PO TABS
20.0000 mg | ORAL_TABLET | Freq: Every day | ORAL | Status: DC
Start: 2013-06-19 — End: 2013-08-14

## 2013-06-19 NOTE — Telephone Encounter (Signed)
Pt called requesting to start antidepressant. Discussed with Ulla Potash, NP - will start celexa 20 mg daily; pt will come by clinic and pick up Rx.

## 2013-06-26 ENCOUNTER — Other Ambulatory Visit (HOSPITAL_COMMUNITY): Payer: Self-pay | Admitting: *Deleted

## 2013-06-26 ENCOUNTER — Other Ambulatory Visit (INDEPENDENT_AMBULATORY_CARE_PROVIDER_SITE_OTHER): Payer: BC Managed Care – PPO

## 2013-06-26 ENCOUNTER — Ambulatory Visit (HOSPITAL_COMMUNITY): Payer: Self-pay | Admitting: *Deleted

## 2013-06-26 DIAGNOSIS — Z7901 Long term (current) use of anticoagulants: Secondary | ICD-10-CM

## 2013-06-26 DIAGNOSIS — Z95811 Presence of heart assist device: Secondary | ICD-10-CM

## 2013-06-26 LAB — PROTIME-INR
INR: 3.8 ratio — ABNORMAL HIGH (ref 0.8–1.0)
Prothrombin Time: 39.6 s — ABNORMAL HIGH (ref 10.2–12.4)

## 2013-06-28 ENCOUNTER — Ambulatory Visit (HOSPITAL_COMMUNITY): Payer: Self-pay | Admitting: *Deleted

## 2013-07-02 NOTE — Addendum Note (Signed)
Encounter addended by: Dolores Patty, MD on: 07/02/2013  4:09 PM<BR>     Documentation filed: Charges VN, Follow-up Section, LOS Section, Visit Diagnoses, Notes Section

## 2013-07-03 ENCOUNTER — Ambulatory Visit (HOSPITAL_COMMUNITY): Payer: Self-pay | Admitting: *Deleted

## 2013-07-03 ENCOUNTER — Other Ambulatory Visit (INDEPENDENT_AMBULATORY_CARE_PROVIDER_SITE_OTHER): Payer: BC Managed Care – PPO

## 2013-07-03 DIAGNOSIS — Z7901 Long term (current) use of anticoagulants: Secondary | ICD-10-CM

## 2013-07-03 DIAGNOSIS — Z95811 Presence of heart assist device: Secondary | ICD-10-CM

## 2013-07-03 LAB — PROTIME-INR
INR: 2.6 ratio — AB (ref 0.8–1.0)
Prothrombin Time: 26.5 s — ABNORMAL HIGH (ref 10.2–12.4)

## 2013-07-16 ENCOUNTER — Other Ambulatory Visit (INDEPENDENT_AMBULATORY_CARE_PROVIDER_SITE_OTHER): Payer: BC Managed Care – PPO

## 2013-07-16 ENCOUNTER — Ambulatory Visit (HOSPITAL_COMMUNITY): Payer: Self-pay | Admitting: *Deleted

## 2013-07-16 DIAGNOSIS — Z95811 Presence of heart assist device: Secondary | ICD-10-CM

## 2013-07-16 DIAGNOSIS — Z7901 Long term (current) use of anticoagulants: Secondary | ICD-10-CM

## 2013-07-16 LAB — PROTIME-INR
INR: 2.7 ratio — AB (ref 0.8–1.0)
PROTHROMBIN TIME: 27.8 s — AB (ref 10.2–12.4)

## 2013-07-17 ENCOUNTER — Other Ambulatory Visit: Payer: BC Managed Care – PPO

## 2013-07-20 ENCOUNTER — Encounter: Payer: Self-pay | Admitting: Cardiovascular Disease

## 2013-07-24 ENCOUNTER — Encounter (HOSPITAL_COMMUNITY): Payer: Self-pay

## 2013-07-31 ENCOUNTER — Other Ambulatory Visit (HOSPITAL_COMMUNITY): Payer: Self-pay | Admitting: *Deleted

## 2013-07-31 ENCOUNTER — Ambulatory Visit (HOSPITAL_COMMUNITY)
Admission: RE | Admit: 2013-07-31 | Discharge: 2013-07-31 | Disposition: A | Discharge status: Home / Self Care | Payer: BC Managed Care – PPO | Source: Ambulatory Visit | Attending: Internal Medicine | Admitting: Internal Medicine

## 2013-07-31 ENCOUNTER — Ambulatory Visit (HOSPITAL_COMMUNITY): Payer: Self-pay | Admitting: *Deleted

## 2013-07-31 VITALS — BP 104/0 | HR 71 | Ht 74.0 in | Wt 241.2 lb

## 2013-07-31 DIAGNOSIS — I428 Other cardiomyopathies: Secondary | ICD-10-CM

## 2013-07-31 DIAGNOSIS — Z7901 Long term (current) use of anticoagulants: Secondary | ICD-10-CM

## 2013-07-31 DIAGNOSIS — Z95811 Presence of heart assist device: Secondary | ICD-10-CM | POA: Insufficient documentation

## 2013-07-31 DIAGNOSIS — I499 Cardiac arrhythmia, unspecified: Secondary | ICD-10-CM

## 2013-07-31 DIAGNOSIS — M129 Arthropathy, unspecified: Secondary | ICD-10-CM | POA: Insufficient documentation

## 2013-07-31 DIAGNOSIS — I5022 Chronic systolic (congestive) heart failure: Secondary | ICD-10-CM

## 2013-07-31 DIAGNOSIS — I472 Ventricular tachycardia, unspecified: Secondary | ICD-10-CM | POA: Insufficient documentation

## 2013-07-31 DIAGNOSIS — I10 Essential (primary) hypertension: Secondary | ICD-10-CM | POA: Insufficient documentation

## 2013-07-31 DIAGNOSIS — I5023 Acute on chronic systolic (congestive) heart failure: Secondary | ICD-10-CM

## 2013-07-31 DIAGNOSIS — M1990 Unspecified osteoarthritis, unspecified site: Secondary | ICD-10-CM

## 2013-07-31 DIAGNOSIS — I4729 Other ventricular tachycardia: Secondary | ICD-10-CM | POA: Insufficient documentation

## 2013-07-31 LAB — BASIC METABOLIC PANEL
BUN: 17 mg/dL (ref 6–23)
CHLORIDE: 103 meq/L (ref 96–112)
CO2: 23 mEq/L (ref 19–32)
Calcium: 8.9 mg/dL (ref 8.4–10.5)
Creatinine, Ser: 1.24 mg/dL (ref 0.50–1.35)
GFR, EST AFRICAN AMERICAN: 73 mL/min — AB (ref >90)
GFR, EST NON AFRICAN AMERICAN: 63 mL/min — AB (ref >90)
Glucose, Bld: 89 mg/dL (ref 70–99)
POTASSIUM: 3.8 meq/L (ref 3.7–5.3)
Sodium: 141 mEq/L (ref 137–147)

## 2013-07-31 LAB — CBC
HCT: 44.4 % (ref 39.0–52.0)
Hemoglobin: 14.9 g/dL (ref 13.0–17.0)
MCH: 29.7 pg (ref 26.0–34.0)
MCHC: 33.6 g/dL (ref 30.0–36.0)
MCV: 88.6 fL (ref 78.0–100.0)
PLATELETS: 166 10*3/uL (ref 150–400)
RBC: 5.01 MIL/uL (ref 4.22–5.81)
RDW: 14.6 % (ref 11.5–15.5)
WBC: 7.1 10*3/uL (ref 4.0–10.5)

## 2013-07-31 LAB — ICD DEVICE OBSERVATION

## 2013-07-31 LAB — LACTATE DEHYDROGENASE: LDH: 250 U/L (ref 94–250)

## 2013-07-31 LAB — PROTIME-INR
INR: 2.42 — AB (ref 0.00–1.49)
Prothrombin Time: 25.5 seconds — ABNORMAL HIGH (ref 11.6–15.2)

## 2013-07-31 LAB — MAGNESIUM: Magnesium: 2.2 mg/dL (ref 1.5–2.5)

## 2013-07-31 MED ORDER — GLUCOSAMINE CHONDR 1500 COMPLX PO CAPS: 1.0000 | ORAL_CAPSULE | Freq: Every day | ORAL | Status: DC

## 2013-07-31 MED ORDER — HYDRALAZINE HCL 100 MG PO TABS: 100.0000 mg | ORAL_TABLET | Freq: Two times a day (BID) | ORAL | Status: DC

## 2013-07-31 MED ORDER — CAPSAICIN 0.075 % EX CREA: 1.0000 "application " | TOPICAL_CREAM | Freq: Two times a day (BID) | CUTANEOUS | Status: DC

## 2013-07-31 NOTE — Progress Notes (Signed)
Symptom  Yes  No  Details   Angina       x Activity:   Claudication       x How far:   Syncope       x When:  dizziness with orthostatic changes (especially bending over); increased since last visit  Stroke       x   Orthopnea       x How many pillows:  2 for comfort  PND       x How often:  CPAP       N/A How many hrs:   Pedal edema       x   Abd fullness       x   N&V       x   Diaphoresis       x When:  Bleeding      x   Urine color    light yellow  SOB       x Activity:   Palpitations       x     When: increased in amount since last visit  ICD shock       x   Hospitlizaitons       x When/where/why:  ED visit       x When/where/why:  Other MD       x      When/who/why:  07/26/13 Dr. Allena KatzPatel - [facility]  Activity        Decreased   Fluid      No limitations  Diet      No limitations    Vital signs: HR:  71 MAP BP:  104 O2 Sat:  98 Wt:  241.2 lbs Last wt:  242.4 lbs Ht:  6'2"  LVAD interrogation reveals:  Speed:  9800 Flow:   4.6 Power:  6.1 PI:  3.7 Alarms:  Few low voltage advisories Events:  5 - 10 PI events; > 20 PI events 4/19, 20, 21/15 Fixed speed:  9800 Low speed limit:  9200  LVAD exit site:  Well healed and incorporated. The velour is fully implanted at exit site. Dressing dry and intact. No erythema or drainage. Stabilization device present and accurately applied. Driveline dressing is being changed q 3 - 5 days per sterile technique using Sorbaview dressing with biopatch on exit site. Pt denies fever or chills. Provided dressing supplies.   Annual maintenance completed per [employer] on patient's UBC. Home power module found to have broken power socket with associated fire risk. Power module loaner PM [phone] given to patient for home use until his PM can be repaired or replaced.   Reviewed and demonstrated the following to patient and caregiver:    Reviewed the steps for replacing the running system controller with the back-up system controller (see patient handbook  section 2 or the appropriate pamphlet).     x  Demonstrated (using the mock-driveline and controller) how to connect and disconnect the mock-driveline in back up system controller in a timely manner (less than 10 seconds) with return demonstration by patient (performed 5 times by pt)     x  Reviewed system controller alarms and troubleshooting including hazard and advisory alarms and accessing alarm history. Re-enforced NOT TO attempt to perform any task displayed on the display screen alone or without calling the VAD pager [phone] (see patient handbook section 5 or the appropriate pamphlet).      x  Reviewed how to handle an emergency including when the  pump is running and when the pump has stopped (see patient handbook section 8). Call 911 first and then the VAD pager at [phone].       x  Reviewed 14-volt lithium ion battery calibration steps (see patient handbook section 3).       x  Reviewed contents of black bag and what must be with patient at all times.       x  Reviewed driveline exit site including cleansing, dressing, and immobilizing with an anchor device to prevent exit site trauma.        x  Reviewed weekly maintenance which includes: Reviewing "Replacing the Running System Controller with a [employer]" pamphlet. Clean batteries, clips, and battery charger contacts.  Check cables for damages. Rotate batteries; keep all 8 charged.       x  Reviewed monthly maintenance which includes: Reviewing Alarms and Troubleshooting. Check battery manufacturer dates. Check use/charge cycles for each battery; remember to re-calibrate every 70 uses when prompted.       x  Additional pamphlets Guide to Replacing the Running System Controller with the Backup Controller for Patients and Their Caregivers and HM II Alarms for Patients and Their Caregivers given to patient.        x  Back up system controller 11 volt  battery charged        x     Batteries Manufacture Date: Number of uses:  Re-calibration  02/14 @ 124 - 126 To be performed by patient

## 2013-07-31 NOTE — Progress Notes (Signed)
Patient ID: Robert [name], male   DOB: [birth year], 58 y.o.   MRN: [id number]  HPI:  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 status post dual chamber Medtronic ICD implanted April 2011. Cath 2011 showed normal coronaries. He also has a history of VTach. Currently listed as 1b list for heart tx.   Follow up: Last visit increased hydralazine to 75 mg BID, can't remember to take TID. Amiodarone was decreased to 200 mg daily and he reports he notices more palpitations with a little light headedness. Saw [facility] and they said that they will not place 1a currently because there are 2 patient's in front of him in the hospital. They wanted to start coreg 6.25 mg BID, however he has not started. Occasional SOB with exertion. Balance is off sometimes and not sure if it is related to R knee pain. Denies CP, orthopnea, edema or PND. Weight 241 lbs. Compliant with medications. Appetite up.   Denies LVAD alarms.  Denies driveline trauma, erythema or drainage.  Denies ICD shocks.  Pt is doing self dressing changes q 3-5days.    Reports taking Coumadin as prescribed and adherence to anticoagulation based dietary restrictions.  Denies bright red blood per rectum or melena, no dark urine or hematuria.     Past Medical History  Diagnosis Date  . AICD (automatic cardioverter/defibrillator) present   . Hypertension   . Congestive heart failure   . Pneumonia   . Bronchitis     Current Outpatient Prescriptions  Medication Sig Dispense Refill  . allopurinol (ZYLOPRIM) 300 MG tablet TAKE ONE TABLET BY MOUTH ONCE DAILY  30 tablet  6  . amiodarone (PACERONE) 200 MG tablet 200 mg daily.      Marland Kitchen. amLODipine (NORVASC) 10 MG tablet Take 1 tablet (10 mg total) by mouth daily.  30 tablet  6  . aspirin EC 325 MG tablet Take 1 tablet (325 mg total) by mouth daily.  30 tablet  6  . hydrALAZINE (APRESOLINE)  25 MG tablet Take 3 tablets (75 mg total) by mouth 2 (two) times daily.  90 tablet  6  . lisinopril (PRINIVIL,ZESTRIL) 20 MG tablet Take two tabs in the am and one tab in the pm  90 tablet  6  . pantoprazole (PROTONIX) 40 MG tablet Take 1 tablet (40 mg total) by mouth daily.  90 tablet  3  . citalopram (CELEXA) 20 MG tablet Take 1 tablet (20 mg total) by mouth daily.  30 tablet  6  . warfarin (COUMADIN) 5 MG tablet Take 1.5 tablets (7.5 mg total) by mouth daily. Take 7.5 mg daily except 5 mg MWF  60 tablet  5   No current facility-administered medications for this encounter.   Allergies  Allergen Reactions  . Imdur [Isosorbide] Other (See Comments)    Headache  . Metoprolol     Passes out   REVIEW OF SYSTEMS: All systems negative except as listed in HPI, PMH and Problem list.  LVAD INTERROGATION:   Speed: 9800  Flow: 4.7 Power: 6.1 PI: 5.0 Alarms: none  Events: 2-5 PI events a day now; 4/19-4/24 >10 PI events a day.  Fixed speed: 9800  Low speed limit: 9200   Filed Vitals:   07/31/13 1050  BP: 104/0  Pulse: 71  Height: 6\' 2"  (1.88 m)  Weight: 241 lb 3.2 oz (109.408 kg)  SpO2: 98%    Physical Exam: GENERAL: Looks good. No distress HEENT: normal  NECK: Supple, JVP flat;  2+ bilaterally, no bruits. No lymphadenopathy or thyromegaly appreciated.   CARDIAC:  Mechanical heart sounds with LVAD hum present.  LUNGS:  Clear to auscultation bilaterally.  ABDOMEN:  Soft, nontender, positive bowel sounds x4.     LVAD exit site: Dressing dry and intact.  No erythema or drainage.  Stabilization device present and accurately applied.  EXTREMITIES:  Warm and dry, no cyanosis, clubbing, rash. No edema NEUROLOGIC:  Alert and oriented x 4.  Gait steady.  No aphasia.  No dysarthria.  Affect pleasant.     ASSESSMENT AND PLAN:   1) Chronic systolic HF, s/p HMII LVAD implant 05/2012. Currently 1B at [facility]. - NYHA II symptoms. Volume status good he may even be a little dry. He is not  currently on any diuretics. He has been having some dizziness and a couple days last week did not feel well and had multiple PI events with PI in the 2's. Have encouraged him to drink more fluids at least 3L a day. Encouraged him to a more liberal with diet and can have a little more salt. - He is not on a BB. He was able to tolerate Toprol before LVAD, however while in the hospital with implant over a year ago had syncope. Could try starting a low dose next visit. - Continue lisinopril at current dose. - Increase hydralazine to 100 mg BID. He can't remember to take TID and not able to take nitrates d/t severe headaches. - Continue to be as active as possible. - Optivol interrogated and no afib and no crossings over threshold. 2) NSVT/VT - Device interrogated and no Afib or NSVT. Will continue amiodarone 200 mg daily. Last LFTs normal. Will need TSH checked next visit. Instructed patient to get yearly eye exams. 3) HTN- - MAP remains elevated. Will increase hydralazine and continue norvasc and ACE-I at current doses. Goal MAP < 90. Can try low dose BB next visit. 4) Anticoagulation with coumadin - Continue coumadin and ASA for LVAD. Check INR today 5) LVAD - LVAD parameters within normal ranges. As above multiple PI events last week likely related to volume. Will continue to monitor. Check Mag, BMET, CBC, LDH and INR today. - Remains on 1B list  Follow up in 6 weeks.   [name] NP-C  11:03 AM

## 2013-07-31 NOTE — Patient Instructions (Addendum)
1.  Increase hydralazine to 100 mg twice daily - new Rx sent.  You may finish up your 25 mg tabs - take 4 of these twice daily until gone. 2.  Start Celexa 20 mg daily 3.  May take Extra strength tylenol twice daily for arthritic pain. 4.  May take glucosamine chondroitin 1500 mg daily for arthritis pain (over the counter med) 5.  May use Capsaicin cream on right knee for arthritis pain (over the counter med) 6  Re-check INR 08/14/13 7.  Return to clinic 3 weeks

## 2013-08-01 ENCOUNTER — Encounter: Payer: Self-pay | Admitting: Licensed Clinical Social Worker

## 2013-08-01 NOTE — Progress Notes (Signed)
CSW referred to assist patient with financial needs. Patient reports he has been denied for SSI and is in process of appeal. Patient is struggling to make ends meet with mortgage payment and utilities. Patient reports he is working with [employer] for a refinance but lengthy process. He currently has a disconnect notice from [employer] and in need of assistance. CSW referred to [employer] for assistance with payment of [facility] energy bill. Patient verbalized frustration with system as he is either over income to qualify for help or under income for assistance. CSW provided supportive intervention around issues of coping with stress and referrals for financial resources. Patient relieved with news of payment for [facility] energy bill and he will continue to pursue appeal for Social Security. CSW will continue to provide support and resources as needed. [name], LCSW [phone]

## 2013-08-09 ENCOUNTER — Encounter (HOSPITAL_COMMUNITY): Payer: Self-pay

## 2013-08-09 ENCOUNTER — Telehealth (HOSPITAL_COMMUNITY): Payer: Self-pay

## 2013-08-09 NOTE — Telephone Encounter (Signed)
Patient appointment rescheduled from 5/19 to 6/10, patient made aware.  Also asked to come by on 5/12 to have BP checked.  Aware and agreeable.

## 2013-08-14 ENCOUNTER — Ambulatory Visit (HOSPITAL_COMMUNITY)
Admission: RE | Admit: 2013-08-14 | Discharge: 2013-08-14 | Disposition: A | Discharge status: Home / Self Care | Payer: BC Managed Care – PPO | Source: Ambulatory Visit | Attending: Internal Medicine | Admitting: Internal Medicine

## 2013-08-14 ENCOUNTER — Ambulatory Visit (HOSPITAL_COMMUNITY): Payer: Self-pay | Admitting: *Deleted

## 2013-08-14 ENCOUNTER — Other Ambulatory Visit: Payer: BC Managed Care – PPO

## 2013-08-14 VITALS — BP 100/0 | HR 92 | Ht 74.0 in | Wt 241.8 lb

## 2013-08-14 DIAGNOSIS — Z95811 Presence of heart assist device: Secondary | ICD-10-CM | POA: Insufficient documentation

## 2013-08-14 DIAGNOSIS — Z7901 Long term (current) use of anticoagulants: Secondary | ICD-10-CM

## 2013-08-14 DIAGNOSIS — I5022 Chronic systolic (congestive) heart failure: Secondary | ICD-10-CM

## 2013-08-14 LAB — PROTIME-INR
INR: 2.58 — ABNORMAL HIGH (ref 0.00–1.49)
Prothrombin Time: 26.8 seconds — ABNORMAL HIGH (ref 11.6–15.2)

## 2013-08-14 NOTE — Progress Notes (Signed)
Symptom  Yes  No  Details   Angina       x Activity:   Claudication       x How far:   Syncope       x When:  dizziness with orthostatic changes (especially bending over)  Stroke       x   Orthopnea       x How many pillows:  2 for comfort  PND       x How often:  CPAP       N/A How many hrs:   Pedal edema       x   Abd fullness       x   N&V       x   Diaphoresis       x When:  Bleeding      x   Urine color    light yellow  SOB       x Activity:   Palpitations       x     When: daily  ICD shock       x   Hospitlizaitons       x When/where/why:  ED visit       x When/where/why:  Other MD            x When/who/why:    Activity       Walking more; working odd jobs  Fluid      No limitations  Diet      No limitations   Vital signs: HR:  92 MAP BP:  100 O2 Sat:  97 Wt:  241.8 lbs Last wt:  241.2  lbs Ht:  6'2"  LVAD interrogation reveals:  Speed:  9800 Flow:   5.8 Power:  6.7 PI:  6.2 Alarms:  none Events:  5 - 10 PI events daily; 40 PI events on 08/13/13; 50 PI events on 08/09/13 Fixed speed:  9800 Low speed limit:  9200   LVAD exit site:  Well healed and incorporated. The velour is fully implanted at exit site. Dressing dry and intact. No erythema or drainage. Stabilization device present and accurately applied. Driveline dressing is being changed q 3 - 5 days per sterile technique using Sorbaview dressing with biopatch on exit site. Pt denies fever or chills. Pt has adequate dressing supplies.   Pt/caregiver deny any alarms or VAD equipment issues. Pt is completing weekly and monthly maintenance for LVAD equipment. LVAD equipment check completed and is in good working order. Back-up equipment present. LVAD education done on emergency procedures and precautions and reviewed exit site care.    Pt still c/o intermittent palpitations especially when bending over, but can occur when lying down. Optivol interrogated - results along with above information reviewed with Dr.  Gala Romney. No med changes at this time, will plan for device check at next clinicvisit with PVC counter review. Instructed pt to call if symptoms worsen or do not improve. Reminded pt to drink minimum 3 liters/day and increase salt intake. Pt verbalized understanding of above.

## 2013-08-14 NOTE — Patient Instructions (Signed)
1.  No change in medications today. 2.  Call if symptoms of dizziness or palpitations (heart racing or beating hard or irregular) worsen. 3.  Return to VAD clinic 09/12/13.

## 2013-08-21 ENCOUNTER — Encounter (HOSPITAL_COMMUNITY): Payer: BC Managed Care – PPO

## 2013-08-22 ENCOUNTER — Encounter: Payer: Self-pay | Admitting: Internal Medicine

## 2013-08-29 ENCOUNTER — Ambulatory Visit (HOSPITAL_COMMUNITY): Payer: Self-pay | Admitting: *Deleted

## 2013-08-29 ENCOUNTER — Other Ambulatory Visit (INDEPENDENT_AMBULATORY_CARE_PROVIDER_SITE_OTHER): Payer: BC Managed Care – PPO

## 2013-08-29 DIAGNOSIS — Z95811 Presence of heart assist device: Secondary | ICD-10-CM

## 2013-08-29 DIAGNOSIS — Z7901 Long term (current) use of anticoagulants: Secondary | ICD-10-CM

## 2013-08-29 LAB — PROTIME-INR
INR: 3.1 ratio — ABNORMAL HIGH (ref 0.8–1.0)
PROTHROMBIN TIME: 33.6 s — AB (ref 9.6–13.1)

## 2013-09-12 ENCOUNTER — Ambulatory Visit (HOSPITAL_COMMUNITY)
Admission: RE | Admit: 2013-09-12 | Discharge: 2013-09-12 | Disposition: A | Discharge status: Home / Self Care | Payer: BC Managed Care – PPO | Source: Ambulatory Visit | Attending: Internal Medicine | Admitting: Internal Medicine

## 2013-09-12 ENCOUNTER — Ambulatory Visit (HOSPITAL_COMMUNITY): Payer: Self-pay | Admitting: *Deleted

## 2013-09-12 ENCOUNTER — Other Ambulatory Visit (HOSPITAL_COMMUNITY): Payer: Self-pay | Admitting: *Deleted

## 2013-09-12 VITALS — BP 114/0 | HR 72 | Ht 74.0 in | Wt 239.0 lb

## 2013-09-12 DIAGNOSIS — Z7901 Long term (current) use of anticoagulants: Secondary | ICD-10-CM

## 2013-09-12 DIAGNOSIS — I5023 Acute on chronic systolic (congestive) heart failure: Secondary | ICD-10-CM

## 2013-09-12 DIAGNOSIS — I4729 Other ventricular tachycardia: Secondary | ICD-10-CM

## 2013-09-12 DIAGNOSIS — I10 Essential (primary) hypertension: Secondary | ICD-10-CM

## 2013-09-12 DIAGNOSIS — Z95811 Presence of heart assist device: Secondary | ICD-10-CM

## 2013-09-12 DIAGNOSIS — I4949 Other premature depolarization: Secondary | ICD-10-CM

## 2013-09-12 DIAGNOSIS — I509 Heart failure, unspecified: Secondary | ICD-10-CM | POA: Insufficient documentation

## 2013-09-12 DIAGNOSIS — I5022 Chronic systolic (congestive) heart failure: Secondary | ICD-10-CM | POA: Insufficient documentation

## 2013-09-12 DIAGNOSIS — Z95810 Presence of automatic (implantable) cardiac defibrillator: Secondary | ICD-10-CM | POA: Insufficient documentation

## 2013-09-12 DIAGNOSIS — I472 Ventricular tachycardia, unspecified: Secondary | ICD-10-CM | POA: Insufficient documentation

## 2013-09-12 DIAGNOSIS — I428 Other cardiomyopathies: Secondary | ICD-10-CM

## 2013-09-12 DIAGNOSIS — I493 Ventricular premature depolarization: Secondary | ICD-10-CM

## 2013-09-12 LAB — CBC
HCT: 42.5 % (ref 39.0–52.0)
Hemoglobin: 14.2 g/dL (ref 13.0–17.0)
MCH: 29.5 pg (ref 26.0–34.0)
MCHC: 33.4 g/dL (ref 30.0–36.0)
MCV: 88.4 fL (ref 78.0–100.0)
Platelets: 153 10*3/uL (ref 150–400)
RBC: 4.81 MIL/uL (ref 4.22–5.81)
RDW: 14.4 % (ref 11.5–15.5)
WBC: 6.2 10*3/uL (ref 4.0–10.5)

## 2013-09-12 LAB — BASIC METABOLIC PANEL
BUN: 19 mg/dL (ref 6–23)
CO2: 25 mEq/L (ref 19–32)
Calcium: 8.6 mg/dL (ref 8.4–10.5)
Chloride: 101 mEq/L (ref 96–112)
Creatinine, Ser: 1.38 mg/dL — ABNORMAL HIGH (ref 0.50–1.35)
GFR, EST AFRICAN AMERICAN: 64 mL/min — AB (ref >90)
GFR, EST NON AFRICAN AMERICAN: 55 mL/min — AB (ref >90)
Glucose, Bld: 98 mg/dL (ref 70–99)
POTASSIUM: 4.1 meq/L (ref 3.7–5.3)
SODIUM: 141 meq/L (ref 137–147)

## 2013-09-12 LAB — PROTIME-INR
INR: 2.68 — ABNORMAL HIGH (ref 0.00–1.49)
PROTHROMBIN TIME: 27.6 s — AB (ref 11.6–15.2)

## 2013-09-12 LAB — ICD DEVICE OBSERVATION

## 2013-09-12 LAB — LACTATE DEHYDROGENASE: LDH: 247 U/L (ref 94–250)

## 2013-09-12 MED ORDER — HYDRALAZINE HCL 100 MG PO TABS: 150.0000 mg | ORAL_TABLET | Freq: Two times a day (BID) | ORAL | Status: DC

## 2013-09-12 NOTE — Patient Instructions (Signed)
1.  Increase Hydralazine to 150 mg twice daily 2.  No change in coumadin; re- check INR on 09/24/13 3.  Return to VAD clinic 2 mos 4.  Come to VAD clinic in two weeks for BP check

## 2013-09-12 NOTE — Progress Notes (Signed)
Symptom  Yes  No  Details   Angina       x Activity:   Claudication       x How far:   Syncope       x When:  dizziness with orthostatic changes (especially bending over)  Stroke       x   Orthopnea       x How many pillows:  2 for comfort  PND       x How often:  CPAP       N/A How many hrs:   Pedal edema       x   Abd fullness       x   N&V       x   Diaphoresis       x When:  Bleeding      x   Urine color    light yellow  SOB       x Activity:   Palpitations        x      When: one time last week  ICD shock       x   Hospitlizaitons       x When/where/why:  ED visit       x When/where/why:  Other MD            x When/who/why:    Activity       Walking 2.5 miles/day  Fluid      No limitations  Diet      No limitations   Vital signs: HR:  72 MAP BP:  114; 110 O2 Sat:  99 Wt:  239.6 lbs Last wt:  241.8  lbs Ht:  6'2"  LVAD interrogation reveals:  Speed:  9800 Flow:   5.3 Power:  6.2 PI:  7.2 Alarms:  none Events:  5 - 10 PI events daily; 60 PI events yesterday Fixed speed:  9800 Low speed limit:  9200   LVAD exit site:  Well healed and incorporated. The velour is fully implanted at exit site. Dressing dry and intact. No erythema or drainage. Stabilization device present and accurately applied. Driveline dressing is being changed q 3 - 5 days per sterile technique using Sorbaview dressing with biopatch on exit site. Pt denies fever or chills. Pt has adequate dressing supplies.   Pt/caregiver deny any alarms or VAD equipment issues. Pt is completing weekly and monthly maintenance for LVAD equipment. LVAD equipment check completed and is in good working order. Back-up equipment present. LVAD education done on emergency procedures and precautions and reviewed exit site care.  Device check per Medtronic rep. Pt c/o fewer dizzy spells; no tachycardic episodes with decreased # of PVC's

## 2013-09-12 NOTE — Progress Notes (Signed)
Patient ID: PERLIE DECHENE, male   DOB: May 19, 1955, 58 y.o.   MRN: [id number]  HPI:  Mr. 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 status post dual chamber Medtronic ICD implanted April 2011. Cath 2011 showed normal coronaries. He also has a history of VTach. Currently listed as 1b list for heart tx.   He returns for follow up. Last visit hydralazine was increased to 100 mg bid . Denies SOB/PND/Orthopnea. Weight at home 239-241 pounds. No shocks. 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 Walking 2.5 miles about 5-6 days a week. Urine yellow.   Denies LVAD alarms.  Denies driveline trauma, erythema or drainage.  Denies ICD shocks.  Pt is doing self dressing changes q 3-5days.   Reports taking Coumadin as prescribed and adherence to anticoagulation based dietary restrictions.  Denies bright red blood per rectum or melena, no dark urine or hematuria.     Past Medical History  Diagnosis Date  . AICD (automatic cardioverter/defibrillator) present   . Hypertension   . Congestive heart failure   . Pneumonia   . Bronchitis     Current Outpatient Prescriptions  Medication Sig Dispense Refill  . allopurinol (ZYLOPRIM) 300 MG tablet TAKE ONE TABLET BY MOUTH ONCE DAILY  30 tablet  6  . amiodarone (PACERONE) 200 MG tablet 200 mg daily.      Marland Kitchen amLODipine (NORVASC) 10 MG tablet Take 1 tablet (10 mg total) by mouth daily.  30 tablet  6  . aspirin EC 325 MG tablet Take 1 tablet (325 mg total) by mouth daily.  30 tablet  6  . hydrALAZINE (APRESOLINE) 100 MG tablet Take 1 tablet (100 mg total) by mouth 2 (two) times daily.  60 tablet  6  . lisinopril (PRINIVIL,ZESTRIL) 20 MG tablet Take two tabs in the am and one tab in the pm  90 tablet  6  . pantoprazole (PROTONIX) 40 MG tablet Take 1 tablet (40 mg total) by mouth daily.  90 tablet  3  . warfarin (COUMADIN) 5 MG  tablet Take 5 mg by mouth daily. Take as directed for INR goal 2.0 - 3.0       No current facility-administered medications for this encounter.   Allergies  Allergen Reactions  . Imdur [Isosorbide] Other (See Comments)    Headache  . Metoprolol     Passes out   REVIEW OF SYSTEMS: All systems negative except as listed in HPI, PMH and Problem list.  LVAD INTERROGATION:   Speed: 9800  Flow: 4.6 Power: 6.0  PI: 6.1  Alarms: none  Events: 5-10.  On 6/9 he had about 50 PI and did not drink as much.   Fixed speed: 9800  Low speed limit: 9200   Filed Vitals:   09/12/13 1204  Pulse: 72  Weight: 239 lb (108.41 kg)  SpO2: 99%    Physical Exam: GENERAL:No acute distress.  HEENT: normal  NECK: Supple, JVP flat;  2+ bilaterally, no bruits. No lymphadenopathy or thyromegaly appreciated.   CARDIAC:  Mechanical heart sounds with LVAD hum present.  LUNGS:  Clear to auscultation bilaterally.  ABDOMEN:  Soft, nontender, positive bowel sounds x4.     LVAD exit site: Dressing dry and intact.  No erythema or drainage.  Stabilization device present and accurately applied.  EXTREMITIES:  Warm and dry, no cyanosis,  clubbing, rash. No edema NEUROLOGIC:  Alert and oriented x 4.  Gait steady.  No aphasia.  No dysarthria.  Affect pleasant.     ASSESSMENT AND PLAN:   1) Chronic systolic HF, s/p HMII LVAD implant 05/2012. [facility] - NYHA II symptoms. Volume status low. Not on any diuretics. Encouraged to drink a little extra.  - He is not on a BB. He was able to tolerate Toprol before LVAD, however while in the hospital with implant over a year ago had syncope. Could try starting a low dose next visit. - Continue lisinopril at current dose. - Increase hydralazine to 150  twice a day.  He cant remember to take 3 times a day. Intolerant imdur due to headaches.  Device interrogated today.  No afib/VT and no crossings over threshold. Fluid well below threshold. Activity 2.8 hours per day.  PVCs runs down to 0.2 per hour . PVC Singles down to 0.6 per hour.  2) NSVT/VT - Device interrogated and no Afib or NSVT. Had one episode NSVT on 08/01/13 about 30 beats but non since.  PVCs runs down to 0.2 per hour . PVC Singles down to 0.6 per hour.   Will continue amiodarone 200 mg daily. Will need TSH checked next visit. Instructed patient to get yearly eye exams. 3) HTN- - MAP elevated. Will increase hydralazine 150 mg twice a day. Continue norvasc and ACE-I at current doses. Goal MAP < 90. Can try low dose BB next visit. 4) Anticoagulation with coumadin - Continue coumadin and ASA for LVAD. Check INR today and adjust coumadin accordingly.  5) LVAD - LVAD parameters within normal ranges. As above multiple PI events yesterday which is likely due to decreased po intake. Will continue to monitor. Check Mag, BMET, CBC, LDH and INR today. - Remains on 1B list  Follow up in 2 months   Keyden Pavlov NP-C  12:07 PM

## 2013-09-21 ENCOUNTER — Encounter: Payer: Self-pay | Admitting: Internal Medicine

## 2013-09-24 ENCOUNTER — Encounter (HOSPITAL_COMMUNITY): Payer: BC Managed Care – PPO

## 2013-09-26 ENCOUNTER — Encounter (HOSPITAL_COMMUNITY): Payer: BC Managed Care – PPO

## 2013-10-01 ENCOUNTER — Other Ambulatory Visit: Payer: BC Managed Care – PPO

## 2013-10-02 ENCOUNTER — Other Ambulatory Visit (INDEPENDENT_AMBULATORY_CARE_PROVIDER_SITE_OTHER): Payer: BC Managed Care – PPO

## 2013-10-02 ENCOUNTER — Ambulatory Visit (HOSPITAL_COMMUNITY): Payer: Self-pay | Admitting: *Deleted

## 2013-10-02 DIAGNOSIS — Z95811 Presence of heart assist device: Secondary | ICD-10-CM

## 2013-10-02 DIAGNOSIS — Z7901 Long term (current) use of anticoagulants: Secondary | ICD-10-CM

## 2013-10-02 LAB — PROTIME-INR
INR: 2.7 ratio — AB (ref 0.8–1.0)
Prothrombin Time: 29.3 s — ABNORMAL HIGH (ref 9.6–13.1)

## 2013-10-22 ENCOUNTER — Ambulatory Visit (HOSPITAL_COMMUNITY): Payer: Self-pay | Admitting: *Deleted

## 2013-10-22 ENCOUNTER — Other Ambulatory Visit (INDEPENDENT_AMBULATORY_CARE_PROVIDER_SITE_OTHER): Payer: BC Managed Care – PPO

## 2013-10-22 DIAGNOSIS — Z95811 Presence of heart assist device: Secondary | ICD-10-CM

## 2013-10-22 DIAGNOSIS — Z7901 Long term (current) use of anticoagulants: Secondary | ICD-10-CM

## 2013-10-22 LAB — PROTIME-INR
INR: 2.7 ratio — ABNORMAL HIGH (ref 0.8–1.0)
PROTHROMBIN TIME: 28.8 s — AB (ref 9.6–13.1)

## 2013-11-08 ENCOUNTER — Encounter (HOSPITAL_COMMUNITY): Payer: Self-pay | Admitting: Emergency Medicine

## 2013-11-08 ENCOUNTER — Emergency Department (HOSPITAL_COMMUNITY)
Admission: EM | Admit: 2013-11-08 | Discharge: 2013-11-08 | Disposition: A | Discharge status: Home / Self Care | Payer: BC Managed Care – PPO | Attending: Emergency Medicine | Admitting: Emergency Medicine

## 2013-11-08 DIAGNOSIS — Z8701 Personal history of pneumonia (recurrent): Secondary | ICD-10-CM | POA: Insufficient documentation

## 2013-11-08 DIAGNOSIS — E876 Hypokalemia: Secondary | ICD-10-CM

## 2013-11-08 DIAGNOSIS — I10 Essential (primary) hypertension: Secondary | ICD-10-CM | POA: Insufficient documentation

## 2013-11-08 DIAGNOSIS — I5022 Chronic systolic (congestive) heart failure: Secondary | ICD-10-CM

## 2013-11-08 DIAGNOSIS — Z8709 Personal history of other diseases of the respiratory system: Secondary | ICD-10-CM | POA: Insufficient documentation

## 2013-11-08 DIAGNOSIS — R011 Cardiac murmur, unspecified: Secondary | ICD-10-CM | POA: Insufficient documentation

## 2013-11-08 DIAGNOSIS — Z95810 Presence of automatic (implantable) cardiac defibrillator: Secondary | ICD-10-CM | POA: Insufficient documentation

## 2013-11-08 DIAGNOSIS — I509 Heart failure, unspecified: Secondary | ICD-10-CM | POA: Insufficient documentation

## 2013-11-08 DIAGNOSIS — R55 Syncope and collapse: Secondary | ICD-10-CM | POA: Insufficient documentation

## 2013-11-08 DIAGNOSIS — Z95811 Presence of heart assist device: Secondary | ICD-10-CM

## 2013-11-08 DIAGNOSIS — Z79899 Other long term (current) drug therapy: Secondary | ICD-10-CM | POA: Insufficient documentation

## 2013-11-08 DIAGNOSIS — Z7982 Long term (current) use of aspirin: Secondary | ICD-10-CM | POA: Insufficient documentation

## 2013-11-08 LAB — COMPREHENSIVE METABOLIC PANEL
ALBUMIN: 3.7 g/dL (ref 3.5–5.2)
ALT: 20 U/L (ref 0–53)
AST: 18 U/L (ref 0–37)
Alkaline Phosphatase: 93 U/L (ref 39–117)
Anion gap: 16 — ABNORMAL HIGH (ref 5–15)
BUN: 16 mg/dL (ref 6–23)
CALCIUM: 8.5 mg/dL (ref 8.4–10.5)
CO2: 20 mEq/L (ref 19–32)
Chloride: 105 mEq/L (ref 96–112)
Creatinine, Ser: 1.28 mg/dL (ref 0.50–1.35)
GFR calc Af Amer: 70 mL/min — ABNORMAL LOW (ref >90)
GFR calc non Af Amer: 60 mL/min — ABNORMAL LOW (ref >90)
Glucose, Bld: 93 mg/dL (ref 70–99)
Potassium: 3.6 mEq/L — ABNORMAL LOW (ref 3.7–5.3)
Sodium: 141 mEq/L (ref 137–147)
Total Bilirubin: 0.6 mg/dL (ref 0.3–1.2)
Total Protein: 6.9 g/dL (ref 6.0–8.3)

## 2013-11-08 LAB — PROTIME-INR
INR: 2.69 — AB (ref 0.00–1.49)
PROTHROMBIN TIME: 28.6 s — AB (ref 11.6–15.2)

## 2013-11-08 LAB — CBC
HCT: 40.2 % (ref 39.0–52.0)
Hemoglobin: 13.4 g/dL (ref 13.0–17.0)
MCH: 29.2 pg (ref 26.0–34.0)
MCHC: 33.3 g/dL (ref 30.0–36.0)
MCV: 87.6 fL (ref 78.0–100.0)
Platelets: 136 10*3/uL — ABNORMAL LOW (ref 150–400)
RBC: 4.59 MIL/uL (ref 4.22–5.81)
RDW: 14.3 % (ref 11.5–15.5)
WBC: 8.5 10*3/uL (ref 4.0–10.5)

## 2013-11-08 LAB — MAGNESIUM: MAGNESIUM: 2.3 mg/dL (ref 1.5–2.5)

## 2013-11-08 LAB — D-DIMER, QUANTITATIVE (NOT AT ARMC): D DIMER QUANT: 0.68 ug{FEU}/mL — AB (ref 0.00–0.48)

## 2013-11-08 LAB — TROPONIN I

## 2013-11-08 MED ORDER — POTASSIUM CHLORIDE CRYS ER 20 MEQ PO TBCR
40.0000 meq | EXTENDED_RELEASE_TABLET | Freq: Once | ORAL | Status: AC
  Administered 2013-11-08: 40 meq via ORAL
  Filled 2013-11-08: qty 2

## 2013-11-08 NOTE — ED Notes (Addendum)
Pt reports coming in from work and went to get some water, felt dizzy, reports making it to the living room and passed out. Reports "I wasn't out long." Pt's son present during episode, states son is autistic. Reports history of the same, reports being told he was dehydrated and should drink more fluids. Denies pain, shortness of breath, states "I just wanted to get checked out"

## 2013-11-08 NOTE — ED Provider Notes (Signed)
Medical screening examination/treatment/procedure(s) were conducted as a shared visit with non-physician practitioner(s) and myself.  I personally evaluated the patient during the encounter  Please see my separate respective documentation pertaining to this patient encounter   Vida Roller, MD 11/08/13 2002

## 2013-11-08 NOTE — Consult Note (Signed)
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 status post dual chamber Medtronic ICD implanted April 2011. Cath 2011 showed normal coronaries. He also has a history of VTach. Currently listed as 1b list for heart tx. He presents to the ER for syncope.   Says he has been feeling fine. Tonight was at home and had some brief palpitations. Went to get a drink of water and then had abrupt syncopal episode. Denies VAD alarms, orthostasis, CP, bleeding or seizure activity. Denies working in heat. No diarrhea, ab pain, n/v. Awoke and felt fine. Came to ER for evaluation.  In ER was in NSR. MAP 94. VAD interrogation normal.  ICD interrogated through [facility] and reviewed personally. No VT/AF. Volume ok.   I did bedside echo. No effusion. LV unloaded well. RV ok.    Denies LVAD alarms. Denies driveline trauma, erythema or drainage. Denies ICD shocks. Pt is doing self dressing changes q 3-5days.   Reports taking Coumadin as prescribed and adherence to anticoagulation based dietary restrictions. Denies bright red blood per rectum or melena, no dark urine or hematuria.    LVAD INTERROGATION:  HeartMate II LVAD:  Flow 6.8 liters/min, speed 9800, power 7.2, PI 5.5. 3 PI events today    Review of Systems: [y] = yes, [ ]  = no   General: Weight gain [ ] ; Weight loss [ ] ; Anorexia [ ] ; Fatigue [ ] ; Fever [ ] ; Chills [ ] ; Weakness [ ]   Cardiac: Chest pain/pressure [ ] ; Resting SOB [ ] ; Exertional SOB [ ] ; Orthopnea [ ] ; Pedal Edema [ ] ; Palpitations [place] ]; Syncope [ y]; Presyncope [ ] ; Paroxysmal nocturnal dyspnea[ ]   Pulmonary: Cough [ ] ; Wheezing[ ] ; Hemoptysis[ ] ; Sputum [ ] ; Snoring [ ]   GI: Vomiting[ ] ; Dysphagia[ ] ; Melena[ ] ; Hematochezia [ ] ; Heartburn[ ] ; Abdominal pain [ ] ; Constipation [ ] ; Diarrhea [ ] ;  BRBPR [ ]   GU: Hematuria[ ] ; Dysuria [ ] ; Nocturia[ ]   Vascular: Pain in legs with walking [ ] ; Pain in feet with lying flat [ ] ; Non-healing sores [ ] ; Stroke [ ] ; TIA [ ] ; Slurred speech [ ] ;  Neuro: Headaches[ ] ; Vertigo[ ] ; Seizures[ ] ; Paresthesias[ ] ;Blurred vision [ ] ; Diplopia [ ] ; Vision changes [ ]   Ortho/Skin: Arthritis [ ] ; Joint pain [ ] ; Muscle pain [ ] ; Joint swelling [ ] ; Back Pain [ ] ; Rash [ ]   Psych: Depression[ ] ; Anxiety[ ]   Heme: Bleeding problems [ ] ; Clotting disorders [ ] ; Anemia [ ]   Endocrine: Diabetes [ ] ; Thyroid dysfunction[ ]   Home Medications Prior to Admission medications   Medication Sig Start Date End Date Taking? Authorizing Provider  allopurinol (ZYLOPRIM) 300 MG tablet Take 300 mg by mouth daily.   Yes Historical Provider, MD  amiodarone (PACERONE) 200 MG tablet 200 mg daily. 04/30/13  Yes Bevelyn Buckles Junia Nygren, MD  amLODipine (NORVASC) 10 MG tablet Take 1 tablet (10 mg total) by mouth daily. 04/30/13  Yes Dolores Patty, MD  hydrALAZINE (APRESOLINE) 100 MG tablet Take 1.5 tablets (150 mg total) by mouth 2 (two) times daily. 09/12/13  Yes Amy D Clegg, NP  lisinopril (PRINIVIL,ZESTRIL) 20 MG tablet Take 20-40 mg by mouth daily. Take 2 tablets (40mg )in the morning, and 1 tablet (20mg ) in  the evening.   Yes Historical Provider, MD  pantoprazole (PROTONIX) 40 MG tablet Take 1 tablet (40 mg total) by mouth daily. 01/24/13  Yes Dolores Patty, MD  warfarin (COUMADIN) 5 MG tablet Take 5-7.5 mg by mouth daily. Take as directed for INR goal 2.0 - 3.0. Take 1 tablet (5mg ) all days except Friday take 1 1/2 tablet (7.5mg ) 04/30/13  Yes Dolores Patty, MD  aspirin EC 325 MG tablet Take 1 tablet (325 mg total) by mouth daily. 04/30/13   Dolores Patty, MD    Past Medical History: Past Medical History  Diagnosis Date  . AICD (automatic cardioverter/defibrillator) present   . Hypertension   . Congestive heart failure   . Pneumonia   . Bronchitis     Past  Surgical History: Past Surgical History  Procedure Laterality Date  . Cardiac defibrillator placement    . Cardiac catheterization    . Colonoscopy  12/29/2011    Procedure: COLONOSCOPY;  Surgeon: Iva Boop, MD;  Location: [facility];  Service: Endoscopy;  Laterality: N/A;  . Insertion of implantable left ventricular assist device N/A 06/02/2012    Procedure: INSERTION OF IMPLANTABLE LEFT VENTRICULAR ASSIST DEVICE;  Surgeon: Kerin Perna, MD;  Location: [facility] OR;  Service: Open Heart Surgery;  Laterality: N/A;  Nitric Oxide; TEE; Medtronic AICD  . Intraoperative transesophageal echocardiogram N/A 06/02/2012    Procedure: INTRAOPERATIVE TRANSESOPHAGEAL ECHOCARDIOGRAM;  Surgeon: Kerin Perna, MD;  Location: [facility] OR;  Service: Open Heart Surgery;  Laterality: N/A;    Family History: No family history on file.  Social History: History   Social History  . Marital Status: Single    Spouse Name: N/A    Number of Children: 2  . Years of Education: N/A   Occupational History  . Disabled    Social History Main Topics  . Smoking status: Never Smoker   . Smokeless tobacco: Never Used  . Alcohol Use: No  . Drug Use: No  . Sexual Activity: None   Other Topics Concern  . None   Social History Narrative  . None    Allergies:  Allergies  Allergen Reactions  . Imdur [Isosorbide] Other (See Comments)    Headache  . Metoprolol     Passes out    Objective:    Vital Signs:   Temp:  [98.4 F (36.9 C)] 98.4 F (36.9 C) (08/06 0028) Pulse Rate:  [70-76] 70 (08/06 0128) Resp:  [14-15] 15 (08/06 0128) BP: (96)/(42) 96/42 mmHg (08/06 0045) SpO2:  [100 %] 100 % (08/06 0128)   There were no vitals filed for this visit.  Mean arterial Pressure 94  Physical Exam: GENERAL:No acute distress.  HEENT: normal  NECK: Supple, JVP 7-8; 2+ bilaterally, no bruits. No lymphadenopathy or thyromegaly appreciated.  CARDIAC: Mechanical heart sounds with LVAD hum present.  LUNGS: Clear to  auscultation bilaterally.  ABDOMEN: Soft, nontender, positive bowel sounds x4.  LVAD exit site: Dressing dry and intact. No erythema or drainage. Stabilization device present and accurately applied. EXTREMITIES: Warm and dry, no cyanosis, clubbing, rash. No edema  NEUROLOGIC: Alert and oriented x 4. Gait steady. No aphasia. No dysarthria. Affect pleasant.    Telemetry: SR  Labs: Basic Metabolic Panel:  Recent Labs Lab 11/08/13 0122  NA 141  K 3.6*  CL 105  CO2 20  GLUCOSE 93  BUN 16  CREATININE 1.28  CALCIUM 8.5  MG 2.3    Liver Function Tests: No results found for this basename:  AST, ALT, ALKPHOS, BILITOT, PROT, ALBUMIN,  in the last 168 hours No results found for this basename: LIPASE, AMYLASE,  in the last 168 hours No results found for this basename: AMMONIA,  in the last 168 hours  CBC:  Recent Labs Lab 11/08/13 0122  WBC 8.5  HGB 13.4  HCT 40.2  MCV 87.6  PLT 136*    Cardiac Enzymes: No results found for this basename: CKTOTAL, CKMB, CKMBINDEX, TROPONINI,  in the last 168 hours  BNP: BNP (last 3 results)  Recent Labs  06/12/13 1123  PROBNP 428.6*    CBG: No results found for this basename: GLUCAP,  in the last 168 hours  Coagulation Studies: No results found for this basename: LABPROT, INR,  in the last 72 hours  Other results: EKG: atrial paced No ST-T wave abnormalities.    Imaging:  No results found.      Assessment:   1. Syncope 2. Chronic systolic HF s/p HM II VAD 3. Hypokalemia   Plan/Discussion:     I cannot find any clear reason for his syncope. There are no significant events on his VAD or ICD which would correlate with reduced blood flow or arrhythmia. MAPs are stable. RV ok on echo and on coumadin so risk PE low.   If remainder of labs OK, I think he can go home with close outpatient f/u. No driving x 6 months.  Supp KCl.    I reviewed the LVAD parameters from today, and compared the results to the patient's prior  recorded data.  No programming changes were made.  The LVAD is functioning within specified parameters.  The patient performs LVAD self-test daily.  LVAD interrogation was negative for any significant power changes, alarms or PI events/speed drops.  LVAD equipment check completed and is in good working order.  Back-up equipment present.   LVAD education done on emergency procedures and precautions and reviewed exit site care.  Length of Stay: 0  Arvilla Meresaniel Lamarr Feenstra MD 11/08/2013, 1:48 AM  VAD Team Pager [phone] (7am - 7am) +++VAD ISSUES ONLY+++  Advanced Heart Failure Team Pager [phone] (M-F; 7a - 4p)  Please contact [facility] for night-coverage after hours (4p -7a ) and weekends on [email or web address] for all non- LVAD Issues

## 2013-11-08 NOTE — ED Provider Notes (Signed)
CSN: [id number]     Arrival date & time 11/08/13  0015 History   First MD Initiated Contact with Patient 11/08/13 [phone]     Chief Complaint  Patient presents with  . Near Syncope     (Consider location/radiation/quality/duration/timing/severity/associated sxs/prior Treatment) HPI Robert [name] is a 58 y.o. male with an LVAD who presents with a syncopal episode that occurred around 11;15pm this evening. Pt reports returning from son's basketball practice and remembers being very thirsty as he had not had much to drink today. He said he had climbed the stairs from the basement upstairs to get some water from the refrigerator and he suddenly began to feel light-headed. He felt like he was going to pass out so he tried to make his way to the living room couch. He fell between his kitchen and living room and denies having hit his head or sustaining any other injury during the event. He denies any cp, SOB, pain with respiration, swelling in extremities, fevers, N/V or abd tenderness. Past Medical History  Diagnosis Date  . AICD (automatic cardioverter/defibrillator) present   . Hypertension   . Congestive heart failure   . Pneumonia   . Bronchitis    Past Surgical History  Procedure Laterality Date  . Cardiac defibrillator placement    . Cardiac catheterization    . Colonoscopy  12/29/2011    Procedure: COLONOSCOPY;  Surgeon: Iva Boop, MD;  Location: [facility];  Service: Endoscopy;  Laterality: N/A;  . Insertion of implantable left ventricular assist device N/A 06/02/2012    Procedure: INSERTION OF IMPLANTABLE LEFT VENTRICULAR ASSIST DEVICE;  Surgeon: Kerin Perna, MD;  Location: [facility] OR;  Service: Open Heart Surgery;  Laterality: N/A;  Nitric Oxide; TEE; Medtronic AICD  . Intraoperative transesophageal echocardiogram N/A 06/02/2012    Procedure: INTRAOPERATIVE TRANSESOPHAGEAL ECHOCARDIOGRAM;  Surgeon: Kerin Perna, MD;  Location: [facility] OR;  Service: Open Heart Surgery;  Laterality:  N/A;   No family history on file. History  Substance Use Topics  . Smoking status: Never Smoker   . Smokeless tobacco: Never Used  . Alcohol Use: No    Review of Systems  Constitutional: Negative for fever.  HENT: Negative for sore throat.   Eyes: Negative for visual disturbance.  Respiratory: Negative for shortness of breath.   Cardiovascular: Negative for chest pain.  Gastrointestinal: Negative for abdominal pain.  Endocrine: Negative for polyuria.  Genitourinary: Negative for dysuria.  Skin: Negative for rash.  Neurological: Positive for syncope. Negative for dizziness, weakness and headaches.  All other systems reviewed and are negative.     Allergies  Imdur and Metoprolol  Home Medications   Prior to Admission medications   Medication Sig Start Date End Date Taking? Authorizing Provider  allopurinol (ZYLOPRIM) 300 MG tablet Take 300 mg by mouth daily.   Yes Historical Provider, MD  amiodarone (PACERONE) 200 MG tablet 200 mg daily. 04/30/13  Yes Bevelyn Buckles Bensimhon, MD  amLODipine (NORVASC) 10 MG tablet Take 1 tablet (10 mg total) by mouth daily. 04/30/13  Yes Dolores Patty, MD  hydrALAZINE (APRESOLINE) 100 MG tablet Take 1.5 tablets (150 mg total) by mouth 2 (two) times daily. 09/12/13  Yes Amy D Clegg, NP  lisinopril (PRINIVIL,ZESTRIL) 20 MG tablet Take 20-40 mg by mouth daily. Take 2 tablets (40mg )in the morning, and 1 tablet (20mg ) in the evening.   Yes Historical Provider, MD  pantoprazole (PROTONIX) 40 MG tablet Take 1 tablet (40 mg total) by mouth daily. 01/24/13  Yes  Dolores Pattyaniel R Bensimhon, MD  warfarin (COUMADIN) 5 MG tablet Take 5-7.5 mg by mouth daily. Take as directed for INR goal 2.0 - 3.0. Take 1 tablet (5mg ) all days except Friday take 1 1/2 tablet (7.5mg ) 04/30/13  Yes Dolores Pattyaniel R Bensimhon, MD  aspirin EC 325 MG tablet Take 1 tablet (325 mg total) by mouth daily. 04/30/13   Bevelyn Bucklesaniel R Bensimhon, MD   BP 102/42  Pulse 70  Temp(Src) 98.4 F (36.9 C) (Oral)  Resp  16  SpO2 100% Physical Exam  Nursing note and vitals reviewed. Constitutional:  Awake, alert, nontoxic appearance.  HENT:  Head: Atraumatic.  Eyes: Right eye exhibits no discharge. Left eye exhibits no discharge.  Neck: Neck supple.  Cardiovascular:  Continuous hum heard on auscultation. DP intact bil.  Pulmonary/Chest: Effort normal. He exhibits no tenderness.  Abdominal: Soft. There is no tenderness. There is no rebound.  Musculoskeletal: He exhibits no tenderness.  Baseline ROM, no obvious new focal weakness.  Neurological:  Mental status and motor strength appears baseline for patient and situation.  Skin: No rash noted.  Psychiatric: He has a normal mood and affect.    ED Course  Procedures (including critical care time) Labs Review Labs Reviewed  CBC - Abnormal; Notable for the following:    Platelets 136 (*)    All other components within normal limits  PROTIME-INR - Abnormal; Notable for the following:    Prothrombin Time 28.6 (*)    INR 2.69 (*)    All other components within normal limits  COMPREHENSIVE METABOLIC PANEL - Abnormal; Notable for the following:    Potassium 3.6 (*)    GFR calc non Af Amer 60 (*)    GFR calc Af Amer 70 (*)    Anion gap 16 (*)    All other components within normal limits  D-DIMER, QUANTITATIVE - Abnormal; Notable for the following:    D-Dimer, Quant 0.68 (*)    All other components within normal limits  MAGNESIUM  TROPONIN I    Imaging Review No results found.   EKG Interpretation   Date/Time:  Thursday November 08 2013 01:02:22 EDT Ventricular Rate:  70 PR Interval:  265 QRS Duration: 129 QT Interval:  426 QTC Calculation: 460 R Axis:   -87 Text Interpretation:  Sinus rhythm Prolonged PR interval IVCD, consider  atypical RBBB Anteroseptal infarct, age indeterminate Lateral leads are  also involved since last tracing no significant change Confirmed by MILLER   MD, BRIAN ([id number]) on 11/08/2013 2:08:33 AM      MDM   Vitals stable in ED Pt says he is resting comfortably and is at his baseline. Consult with cardiology- they do not feel syncopal episode is cardiogenic in nature and LVAD appears to be functioning appropriately. Syncopal event most likely due to poor hydration status Discussed plan of discharge home and maintaining proper hydration status and pt was very receptive. Final diagnoses:  Syncope and collapse    Meds given in ED:  Medications  potassium chloride SA (K-DUR,KLOR-CON) CR tablet 40 mEq (40 mEq Oral Given 11/08/13 0207)    Discharge Medication List as of 11/08/2013  2:28 AM         Sharlene MottsBenjamin W Gracelin Weisberg, PA-C 11/08/13 [phone]

## 2013-11-08 NOTE — ED Provider Notes (Signed)
58 year old male, history of cardiac disease requiring implantable cardiac defibrillator as well as a history of left ventricular assist device. He presents because he is had a syncopal episode this evening. This occurred after not having very much to eat or drink today, he had a brief loss of consciousness during which time he had fallen to the ground, he has no complaints at this time.  On exam the patient is in no distress, is no peripheral edema, does not appear to be having any shortness of breath and speaks in full sentences without difficulty.  Cardiologist at the bedside evaluating the patient, investigation of the left ventricular assist device shows no episodes of low flow, recommendations to follow from cardiologist Dr. Gala Romney  D/w Medtronic - no recorded episodes - no other abnormal findings.  /dr. Bensimhon reccomends d/c.  Medical screening examination/treatment/procedure(s) were conducted as a shared visit with non-physician practitioner(s) and myself.  I personally evaluated the patient during the encounter.  Clinical Impression: Syncope      Robert Roller, MD 11/08/13 [phone]

## 2013-11-08 NOTE — ED Notes (Signed)
Dr. Bensimhon at bedside  

## 2013-11-08 NOTE — ED Notes (Signed)
Pt. reports dizziness/brief syncope this evening , denies chest pain or palpitations/ respirations unlabored . Pt. has a LVAD and pacemaker/defibrillator .

## 2013-11-08 NOTE — ED Notes (Signed)
Ben,PA at bedside; Gunnar Fusi, Rapid response at bedside

## 2013-11-08 NOTE — Discharge Instructions (Signed)
Please call your doctor for a followup appointment within 24-48 hours. When you talk to your doctor please let them know that you were seen in the emergency department and have them acquire all of your records so that they can discuss the findings with you and formulate a treatment plan to fully care for your new and ongoing problems. ° °

## 2013-11-08 NOTE — ED Notes (Signed)
Recruitment consultant

## 2013-11-09 ENCOUNTER — Telehealth (HOSPITAL_COMMUNITY): Payer: Self-pay | Admitting: *Deleted

## 2013-11-09 NOTE — Telephone Encounter (Signed)
Pt called to report he saw Dr. Allena Katz at [facility] yesterday and was told to have his knee "drained".  Referral called to [facility] orthopedics per Dr. Shirlee Latch. Called pt with contact info [phone] with instructions to call for appt Monday 11/12/13. Pt verbalized understanding of same.

## 2013-11-13 ENCOUNTER — Other Ambulatory Visit (HOSPITAL_COMMUNITY): Payer: Self-pay | Admitting: *Deleted

## 2013-11-13 DIAGNOSIS — Z95811 Presence of heart assist device: Secondary | ICD-10-CM

## 2013-11-13 DIAGNOSIS — Z79899 Other long term (current) drug therapy: Secondary | ICD-10-CM

## 2013-11-13 DIAGNOSIS — Z7901 Long term (current) use of anticoagulants: Secondary | ICD-10-CM

## 2013-11-14 ENCOUNTER — Ambulatory Visit (HOSPITAL_COMMUNITY)
Admission: RE | Admit: 2013-11-14 | Discharge: 2013-11-14 | Disposition: A | Discharge status: Home / Self Care | Payer: BC Managed Care – PPO | Source: Ambulatory Visit | Attending: Internal Medicine | Admitting: Internal Medicine

## 2013-11-14 ENCOUNTER — Encounter (HOSPITAL_COMMUNITY): Payer: Self-pay

## 2013-11-14 ENCOUNTER — Ambulatory Visit (HOSPITAL_COMMUNITY): Payer: Self-pay | Admitting: *Deleted

## 2013-11-14 VITALS — BP 104/0 | HR 70 | Ht 74.0 in | Wt 241.2 lb

## 2013-11-14 DIAGNOSIS — I5022 Chronic systolic (congestive) heart failure: Secondary | ICD-10-CM | POA: Diagnosis not present

## 2013-11-14 DIAGNOSIS — Z7901 Long term (current) use of anticoagulants: Secondary | ICD-10-CM

## 2013-11-14 DIAGNOSIS — I472 Ventricular tachycardia, unspecified: Secondary | ICD-10-CM

## 2013-11-14 DIAGNOSIS — Z95811 Presence of heart assist device: Secondary | ICD-10-CM | POA: Diagnosis present

## 2013-11-14 DIAGNOSIS — I4729 Other ventricular tachycardia: Secondary | ICD-10-CM

## 2013-11-14 DIAGNOSIS — I10 Essential (primary) hypertension: Secondary | ICD-10-CM

## 2013-11-14 DIAGNOSIS — Z79899 Other long term (current) drug therapy: Secondary | ICD-10-CM

## 2013-11-14 LAB — BASIC METABOLIC PANEL
ANION GAP: 12 (ref 5–15)
BUN: 16 mg/dL (ref 6–23)
CHLORIDE: 101 meq/L (ref 96–112)
CO2: 25 mEq/L (ref 19–32)
Calcium: 8.6 mg/dL (ref 8.4–10.5)
Creatinine, Ser: 1.11 mg/dL (ref 0.50–1.35)
GFR, EST AFRICAN AMERICAN: 83 mL/min — AB (ref >90)
GFR, EST NON AFRICAN AMERICAN: 71 mL/min — AB (ref >90)
Glucose, Bld: 93 mg/dL (ref 70–99)
Potassium: 3.9 mEq/L (ref 3.7–5.3)
SODIUM: 138 meq/L (ref 137–147)

## 2013-11-14 LAB — PRO B NATRIURETIC PEPTIDE: Pro B Natriuretic peptide (BNP): 540.8 pg/mL — ABNORMAL HIGH (ref 0–125)

## 2013-11-14 LAB — PREALBUMIN: PREALBUMIN: 25.4 mg/dL (ref 17.0–34.0)

## 2013-11-14 LAB — PROTIME-INR
INR: 2.87 — ABNORMAL HIGH (ref 0.00–1.49)
Prothrombin Time: 30.1 seconds — ABNORMAL HIGH (ref 11.6–15.2)

## 2013-11-14 LAB — LACTATE DEHYDROGENASE: LDH: 251 U/L — AB (ref 94–250)

## 2013-11-14 NOTE — Patient Instructions (Signed)
Doing great!!!  Follow up in 6 weeks.  Next INR is September 1st.  No change in medications.  Call any issues.  Look into concealed Tour manager.

## 2013-11-14 NOTE — Progress Notes (Signed)
PCP: N/A   HPI: 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 status post dual chamber Medtronic ICD implanted April 2011. Cath 2011 showed normal coronaries. He also has a history of VTach. Currently listed as 1b list for heart tx.   Follow up for Heart Failure/LVAD: Last visit increased hydralazine to 150 mg BID which he tolerated. Was seen in the ED for syncope (11/14/13) after brief palpitations. No alarms. Dr. Gala [name] did bedside ECHO showing no effusion, LV unloaded well and RV good. ICD interrogated and no VT/AF and volume ok. Had R knee pain drained yesterday and pain improved. Denies SOB, orthopnea, PND or CP. Saw Dr. Allena [name] at [facility] and patient remains on status 1B visit.   Denies LVAD alarms.  Denies driveline trauma, erythema or drainage.  Denies ICD shocks.   Reports taking Coumadin as prescribed and adherence to anticoagulation based dietary restrictions.  Denies bright red blood per rectum or melena, no dark urine or hematuria.     Past Medical History  Diagnosis Date  . AICD (automatic cardioverter/defibrillator) present   . Hypertension   . Congestive heart failure   . Pneumonia   . Bronchitis     Current Outpatient Prescriptions  Medication Sig Dispense Refill  . allopurinol (ZYLOPRIM) 300 MG tablet Take 300 mg by mouth daily.      Marland Kitchen. amiodarone (PACERONE) 200 MG tablet 200 mg daily.      Marland Kitchen. amLODipine (NORVASC) 10 MG tablet Take 1 tablet (10 mg total) by mouth daily.  30 tablet  6  . aspirin EC 325 MG tablet Take 1 tablet (325 mg total) by mouth daily.  30 tablet  6  . hydrALAZINE (APRESOLINE) 100 MG tablet Take 1.5 tablets (150 mg total) by mouth 2 (two) times daily.  90 tablet  6  . lisinopril (PRINIVIL,ZESTRIL) 20 MG tablet Take 20-40 mg by mouth daily. Take 2 tablets (40mg )in the morning, and 1 tablet (20mg ) in the evening.      . pantoprazole  (PROTONIX) 40 MG tablet Take 1 tablet (40 mg total) by mouth daily.  90 tablet  3  . warfarin (COUMADIN) 5 MG tablet Take 5-7.5 mg by mouth daily. Take as directed for INR goal 2.0 - 3.0. Take 1 tablet (5mg ) all days except Friday take 1 1/2 tablet (7.5mg )       No current facility-administered medications for this encounter.    Imdur and Metoprolol  REVIEW OF SYSTEMS: All systems negative except as listed in HPI, PMH and Problem list.   LVAD INTERROGATION:   HeartMate II LVAD:   Flow: 5.1 liters/min Speed: 9800 Power: 6.2 PI: 7.0 Alarms: Events: Fixed Speed: 9800 Low speed limit: 9200  I reviewed the LVAD parameters from today, and compared the results to the patient's prior recorded data.  No programming changes were made.  The LVAD is functioning within specified parameters.  The patient performs LVAD self-test daily.  LVAD interrogation was negative for any significant power changes, alarms or PI events/speed drops.  LVAD equipment check completed and is in good working order.  Back-up equipment present.   LVAD education done on emergency procedures and precautions and reviewed exit site care.    Filed Vitals:   11/14/13 1128  BP: 104/0  Pulse: 70  Height: 6\' 2"  (1.88 m)  Weight: 241 lb 3.2 oz (109.408 kg)  SpO2: 99%  Physical Exam: GENERAL: Well appearing, male who presents to clinic today in no acute distress. HEENT: normal  NECK: Supple, JVP flat;  2+ bilaterally, no bruits.  No lymphadenopathy or thyromegaly appreciated.   CARDIAC:  Mechanical heart sounds with LVAD hum present.  LUNGS:  Clear to auscultation bilaterally.  ABDOMEN:  Soft, round, nontender, positive bowel sounds x4.     LVAD exit site: well-healed and incorporated.  Dressing dry and intact.  No erythema or drainage.  Stabilization device present and accurately applied.  Driveline dressing is being changed daily per sterile technique. EXTREMITIES:  Warm and dry, no cyanosis, clubbing, rash or edema   NEUROLOGIC:  Alert and oriented x 4.  Gait steady.  No aphasia.  No dysarthria.  Affect pleasant.       ASSESSMENT AND PLAN:    1) Chronic systolic HF, s/p HMII LVAD implant 05/2012. Currently 1B at [facility].  - NYHA II symptoms and volume status stable. He is not on any diuretics.  - He is not on a BB. He was able to tolerate Toprol before LVAD, however while in the hospital with implant over a year ago had syncope. Could try starting a low dose next visit.  - Continue lisinopril, norvasc and hydralazine at current doses. Intolerant of Imdur d/t headaches.  - Continue to weigh daily. 2) NSVT/VT  - Device interrogated in the ED last week when he was seen for syncope. No afib or NSVT. Will continue amiodarone 200 mg daily. Check TSH and free T4. 3) HTN-  - MAP slightly elevated. Continue norvasc, ACE-I ang hydralazine at current doses. Goal MAP < 90. Can try low dose BB next visit.  4) Anticoagulation with coumadin  - Continue coumadin and ASA for LVAD. Check INR today and adjust coumadin accordingly.  5) LVAD  - LVAD parameters within normal ranges. .  - Check Mag, BMET, CBC, LDH and INR today.  - Remains on 1B list at [facility] and just seen last week by their team. 6) Knee pain - When he was seen at [facility] they recommended him having it tapped d/t effusion and causing a lot of pain. He went to Ortho yesterday and had it drained and cortisone injection. Pain much better and will continue to follow.   F/U 2 months [name] 6:30 PM

## 2013-11-14 NOTE — Progress Notes (Signed)
Symptom  Yes  No  Details   Angina       x Activity:   Claudication       x How far:   Syncope       x When:  dizziness with orthostatic changes (especially bending over)  Stroke       x   Orthopnea       x How many pillows:  2 for comfort  PND       x How often:  CPAP       N/A How many hrs:   Pedal edema       x   Abd fullness       x   N&V       x   Diaphoresis       x When:  Bleeding      x   Urine color    light yellow  SOB       x Activity:   Palpitations        x      When: one time last week  ICD shock       x   Hospitlizaitons       x When/where/why:  ED visit       x When/where/why:  Other MD            x When/who/why:    Activity        limited with right knee; fluid drawn off yesterday and cortisone injection  Fluid      No limitations  Diet      No limitations   Vital signs: HR:  70 MAP BP:  104 O2 Sat:  99 Wt:  241.2 lbs Last wt:  239.6   lbs Ht:  6'2"  LVAD interrogation reveals:  Speed:  9800 Flow:   4.9 Power:  6.2 PI:  7.1 Alarms:  none Events:  1 - 5 PI events Fixed speed:  9800 Low speed limit:  9200   LVAD exit site:  Well healed and incorporated. The velour is fully implanted at exit site. Dressing dry and intact. No erythema or drainage. Stabilization device present and accurately applied. Driveline dressing is being changed q 3 - 5 days per sterile technique using Sorbaview dressing with biopatch on exit site. Pt denies fever or chills. Pt has adequate dressing supplies.   Pt/caregiver deny any alarms or VAD equipment issues. Pt is completing weekly and monthly maintenance for LVAD equipment. LVAD equipment check completed and is in good working order. Back-up equipment present. LVAD education done on emergency procedures and precautions and reviewed exit site care.  Unable to complete 6 min walk today due to right knee. Pt had fluid drawn off with cortisone injection at [facility] yesterday.

## 2013-11-15 ENCOUNTER — Other Ambulatory Visit (HOSPITAL_COMMUNITY): Payer: Self-pay | Admitting: *Deleted

## 2013-11-15 DIAGNOSIS — Z79899 Other long term (current) drug therapy: Secondary | ICD-10-CM

## 2013-11-15 LAB — T4: T4, Total: 8.3 ug/dL (ref 5.0–12.5)

## 2013-11-15 LAB — TSH: TSH: 1.73 u[IU]/mL (ref 0.350–4.500)

## 2013-12-04 ENCOUNTER — Ambulatory Visit (HOSPITAL_COMMUNITY): Payer: Self-pay | Admitting: *Deleted

## 2013-12-04 ENCOUNTER — Other Ambulatory Visit (INDEPENDENT_AMBULATORY_CARE_PROVIDER_SITE_OTHER): Payer: BC Managed Care – PPO

## 2013-12-04 DIAGNOSIS — Z7901 Long term (current) use of anticoagulants: Secondary | ICD-10-CM

## 2013-12-04 DIAGNOSIS — Z95811 Presence of heart assist device: Secondary | ICD-10-CM

## 2013-12-04 LAB — PROTIME-INR
INR: 3.1 ratio — ABNORMAL HIGH (ref 0.8–1.0)
PROTHROMBIN TIME: 32.9 s — AB (ref 9.6–13.1)

## 2013-12-12 ENCOUNTER — Telehealth: Payer: Self-pay | Admitting: Licensed Clinical Social Worker

## 2013-12-12 NOTE — Telephone Encounter (Signed)
CSW retruned call to patient who had requested information on heating programs for the winter months. CSW provided numbers for [employer] and [employer] for emergency assistance and [employer] for additional [employer]. Patient does not appear to be eligible for the Low Income Energy Assistance program as he is not 58 years of age. Patient verbalized understanding of follow up and will contact CSW if further needs arise. [name], LCSW [phone]

## 2013-12-25 ENCOUNTER — Other Ambulatory Visit (HOSPITAL_COMMUNITY): Payer: Self-pay | Admitting: *Deleted

## 2013-12-25 DIAGNOSIS — Z95811 Presence of heart assist device: Secondary | ICD-10-CM

## 2013-12-25 DIAGNOSIS — Z7901 Long term (current) use of anticoagulants: Secondary | ICD-10-CM

## 2013-12-26 ENCOUNTER — Ambulatory Visit (HOSPITAL_COMMUNITY): Payer: Self-pay | Admitting: *Deleted

## 2013-12-26 ENCOUNTER — Inpatient Hospital Stay (HOSPITAL_COMMUNITY): Admission: RE | Admit: 2013-12-26 | Payer: BC Managed Care – PPO | Source: Ambulatory Visit

## 2013-12-26 ENCOUNTER — Encounter (HOSPITAL_COMMUNITY): Payer: Self-pay | Admitting: *Deleted

## 2013-12-26 ENCOUNTER — Ambulatory Visit (HOSPITAL_COMMUNITY)
Admission: RE | Admit: 2013-12-26 | Discharge: 2013-12-26 | Disposition: A | Discharge status: Home / Self Care | Payer: BC Managed Care – PPO | Source: Ambulatory Visit | Attending: Internal Medicine | Admitting: Internal Medicine

## 2013-12-26 ENCOUNTER — Encounter (HOSPITAL_COMMUNITY): Payer: Self-pay

## 2013-12-26 VITALS — BP 122/0 | HR 68 | Ht 74.0 in | Wt 241.6 lb

## 2013-12-26 DIAGNOSIS — Z95811 Presence of heart assist device: Secondary | ICD-10-CM

## 2013-12-26 DIAGNOSIS — Z8679 Personal history of other diseases of the circulatory system: Secondary | ICD-10-CM

## 2013-12-26 DIAGNOSIS — I472 Ventricular tachycardia, unspecified: Secondary | ICD-10-CM

## 2013-12-26 DIAGNOSIS — I10 Essential (primary) hypertension: Secondary | ICD-10-CM | POA: Diagnosis not present

## 2013-12-26 DIAGNOSIS — Z79899 Other long term (current) drug therapy: Secondary | ICD-10-CM | POA: Diagnosis not present

## 2013-12-26 DIAGNOSIS — I509 Heart failure, unspecified: Secondary | ICD-10-CM | POA: Diagnosis present

## 2013-12-26 DIAGNOSIS — I428 Other cardiomyopathies: Secondary | ICD-10-CM

## 2013-12-26 DIAGNOSIS — Z7982 Long term (current) use of aspirin: Secondary | ICD-10-CM | POA: Insufficient documentation

## 2013-12-26 DIAGNOSIS — I5023 Acute on chronic systolic (congestive) heart failure: Secondary | ICD-10-CM

## 2013-12-26 DIAGNOSIS — Z7901 Long term (current) use of anticoagulants: Secondary | ICD-10-CM | POA: Diagnosis not present

## 2013-12-26 DIAGNOSIS — I5022 Chronic systolic (congestive) heart failure: Secondary | ICD-10-CM | POA: Diagnosis not present

## 2013-12-26 DIAGNOSIS — I4729 Other ventricular tachycardia: Secondary | ICD-10-CM | POA: Insufficient documentation

## 2013-12-26 DIAGNOSIS — Z95810 Presence of automatic (implantable) cardiac defibrillator: Secondary | ICD-10-CM | POA: Insufficient documentation

## 2013-12-26 LAB — CBC
HCT: 44.4 % (ref 39.0–52.0)
Hemoglobin: 14.7 g/dL (ref 13.0–17.0)
MCH: 29.5 pg (ref 26.0–34.0)
MCHC: 33.1 g/dL (ref 30.0–36.0)
MCV: 89.2 fL (ref 78.0–100.0)
Platelets: 175 10*3/uL (ref 150–400)
RBC: 4.98 MIL/uL (ref 4.22–5.81)
RDW: 14.5 % (ref 11.5–15.5)
WBC: 7.3 10*3/uL (ref 4.0–10.5)

## 2013-12-26 LAB — COMPREHENSIVE METABOLIC PANEL
ALT: 16 U/L (ref 0–53)
AST: 17 U/L (ref 0–37)
Albumin: 3.9 g/dL (ref 3.5–5.2)
Alkaline Phosphatase: 97 U/L (ref 39–117)
Anion gap: 10 (ref 5–15)
BUN: 13 mg/dL (ref 6–23)
CO2: 26 mEq/L (ref 19–32)
Calcium: 8.9 mg/dL (ref 8.4–10.5)
Chloride: 103 mEq/L (ref 96–112)
Creatinine, Ser: 1.25 mg/dL (ref 0.50–1.35)
GFR calc Af Amer: 72 mL/min — ABNORMAL LOW (ref >90)
GFR calc non Af Amer: 62 mL/min — ABNORMAL LOW (ref >90)
Glucose, Bld: 84 mg/dL (ref 70–99)
Potassium: 4 mEq/L (ref 3.7–5.3)
Sodium: 139 mEq/L (ref 137–147)
Total Bilirubin: 0.8 mg/dL (ref 0.3–1.2)
Total Protein: 7.1 g/dL (ref 6.0–8.3)

## 2013-12-26 LAB — PREALBUMIN: Prealbumin: 26.8 mg/dL (ref 17.0–34.0)

## 2013-12-26 LAB — LACTATE DEHYDROGENASE: LDH: 275 U/L — AB (ref 94–250)

## 2013-12-26 LAB — PRO B NATRIURETIC PEPTIDE: PRO B NATRI PEPTIDE: 338.1 pg/mL — AB (ref 0–125)

## 2013-12-26 LAB — PROTIME-INR
INR: 2.77 — ABNORMAL HIGH (ref 0.00–1.49)
Prothrombin Time: 29.3 seconds — ABNORMAL HIGH (ref 11.6–15.2)

## 2013-12-26 MED ORDER — HYDRALAZINE HCL 100 MG PO TABS: 200.0000 mg | ORAL_TABLET | Freq: Two times a day (BID) | ORAL | Status: DC

## 2013-12-26 NOTE — Progress Notes (Signed)
Symptom  Yes  No  Details   Angina       x Activity:   Claudication       x How far:   Syncope       x When:  dizziness with orthostatic changes (especially bending over)  Stroke       x   Orthopnea       x How many pillows:  2 for comfort  PND       x How often:  CPAP       N/A How many hrs:   Pedal edema       x   Abd fullness       x   N&V       x   Diaphoresis       x When:  Bleeding      x   Urine color    light yellow  SOB       x Activity:   Palpitations        x      When: weekly  ICD shock       x   Hospitlizaitons       x When/where/why:  ED visit       x When/where/why:  Other MD            x When/who/why:    Activity        limited with right knee; fluid reaccumulation  Fluid      No limitations  Diet      No limitations   Vital signs: HR:  68 MAP BP:  122 O2 Sat:  98 Wt:  241.6 lbs Last wt:  241.2   lbs Ht:  6'2"  LVAD interrogation reveals:  Speed:  9800 Flow:   4.7 Power:  6.0 PI:  6.0 Alarms:  none Events:  5 - 15 PI events;  9/16 - 34 PI; 9/17 - 62 PI events Fixed speed:  9800 Low speed limit:  9200  11V battery status:  Primary controller: replace 22 months (January 2017) Secondary controller:  Replace 22 months (January 2017)   LVAD exit site:  Well healed and incorporated. The velour is fully implanted at exit site. Dressing dry and intact. No erythema or drainage. Stabilization device present and accurately applied. Driveline dressing is being changed q 3 - 5 days per sterile technique using Sorbaview dressing with biopatch on exit site. Pt denies fever or chills. Pt has adequate dressing supplies.   Pt/caregiver deny any alarms or VAD equipment issues. Pt is completing weekly and monthly maintenance for LVAD equipment. LVAD equipment check completed and is in good working order. Back-up equipment present. LVAD education done on emergency procedures and precautions and reviewed exit site care.  Unable to complete 6 min walk today due to right  knee. Pt had fluid drawn off with cortisone injection at [facility] yesterday.  Back up controller back up battery charged.  Pt completed 1.5 year Intermacs and completed 1050 feet during 6 min walk.

## 2013-12-26 NOTE — Progress Notes (Signed)
6 Minute Walk  Start 94% O2 on room air 97 HR  Ambulated total 1060 feet, no rest breaks needs, quick and steady pace and gait.  Stop 95% 91 HR

## 2013-12-26 NOTE — Progress Notes (Signed)
Patient ID: Robert [name], male   DOB: 1955-08-01, 58 y.o.   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 status post dual chamber Medtronic ICD implanted April 2011. Cath 2011 showed normal coronaries. He also has a history of VTach. Currently listed as 1b list for heart tx.   Follow up for Heart Failure/LVAD: No changes in medications last time. Reports doing pretty well other than he still has difficult days with coping with LVAD. Denies SOB, orthopnea, PND or CP. Ocassionally feels like he is breathing through a straw. Feels like he has palpitations occasionally. Remains on status 1B visit.   Denies LVAD alarms.  Denies driveline trauma, erythema or drainage.  Denies ICD shocks.   Reports taking Coumadin as prescribed and adherence to anticoagulation based dietary restrictions.  Denies bright red blood per rectum or melena, no dark urine or hematuria.     Past Medical History  Diagnosis Date  . AICD (automatic cardioverter/defibrillator) present   . Hypertension   . Congestive heart failure   . Pneumonia   . Bronchitis     Current Outpatient Prescriptions  Medication Sig Dispense Refill  . allopurinol (ZYLOPRIM) 300 MG tablet Take 300 mg by mouth daily.      Marland Kitchen amiodarone (PACERONE) 200 MG tablet 200 mg daily.      Marland Kitchen amLODipine (NORVASC) 10 MG tablet Take 1 tablet (10 mg total) by mouth daily.  30 tablet  6  . aspirin EC 325 MG tablet Take 1 tablet (325 mg total) by mouth daily.  30 tablet  6  . hydrALAZINE (APRESOLINE) 100 MG tablet Take 1.5 tablets (150 mg total) by mouth 2 (two) times daily.  90 tablet  6  . lisinopril (PRINIVIL,ZESTRIL) 20 MG tablet Take 20-40 mg by mouth daily. Take 2 tablets ( )in the morning, and 1 tablet ( ) in the evening.      . pantoprazole (PROTONIX) 40 MG tablet Take 1 tablet (40 mg total) by mouth  daily.  90 tablet  3  . warfarin (COUMADIN) 5 MG tablet Take 5-7.5 mg by mouth daily. Take as directed for INR goal 2.0 - 3.0. Take 1 tablet ( ) all days except Friday take 1 1/2 tablet (7.5mg )       No current facility-administered medications for this encounter.    Imdur and Metoprolol  REVIEW OF SYSTEMS: All systems negative except as listed in HPI, PMH and Problem list.   LVAD INTERROGATION:   HeartMate II LVAD:   Flow: 4.3 liters/min Speed: 9800 Power: 5.8 PI: 5.5 Alarms: None Events: 5 - 15 PI events; 9/16 - 34 PI; 9/17 - 62 PI events Fixed Speed: 9800 Low speed limit: 9200  I reviewed the LVAD parameters from today, and compared the results to the patient's prior recorded data.  No programming changes were made.  The LVAD is functioning within specified parameters.  The patient performs LVAD self-test daily.  LVAD interrogation was negative for any significant power changes, alarms or PI events/speed drops.  LVAD equipment check completed and is in good working order.  Back-up equipment present.   LVAD education done on emergency procedures and precautions and reviewed exit site care.    Filed Vitals:   12/26/13 0918  BP: 122/0  Pulse: 68  Height:  (1.88 m)  Weight: 241 lb 9.6 oz (  109.589 kg)  SpO2: 98%    Physical Exam: GENERAL: Well appearing, male who presents to clinic today in no acute distress. HEENT: normal  NECK: Supple, JVP flat;  2+ bilaterally, no bruits.  No lymphadenopathy or thyromegaly appreciated.   CARDIAC:  Mechanical heart sounds with LVAD hum present.  LUNGS:  Clear to auscultation bilaterally.  ABDOMEN:  Soft, round, nontender, positive bowel sounds x4.     LVAD exit site: well-healed and incorporated.  Dressing dry and intact.  No erythema or drainage.  Stabilization device present and accurately applied.  Driveline dressing is being changed daily per sterile technique. EXTREMITIES:  Warm and dry, no cyanosis, clubbing, rash or edema   NEUROLOGIC:  Alert and oriented x 4.  Gait steady.  No aphasia.  No dysarthria.  Affect pleasant.       ASSESSMENT AND PLAN:    1) Chronic systolic HF, s/p HMII LVAD implant 05/2012. Currently 1B at [facility].  - NYHA II symptoms and volume status stable. He is not on any diuretics and still maybe a little dry. Have encouraged the patient to drink as much as he can and to drink more than 2L a day.  - Optivol interrogated and thoracic impedence up, fluid below threshold, no afib or VTach and activity about 4 hrs a day.  - He is not on a BB. He was able to tolerate Toprol before LVAD, however while in the hospital with implant over a year ago had syncope. Could try starting a low dose to help with MAP since it is 122, however would like to have RAMP ECHO first to assess RV.  - Continue lisinopril and norvasc at current doses. Will increase hydralazine to 200 mg BID. Patient can't remember to take TID. Intolerant of Imdur d/t headaches.  - Reinforced the need and importance of daily weights, a low sodium diet, and fluid restriction (less than 2 L a day). Instructed to call the HF clinic if weight increases more than 3 lbs overnight or 5 lbs in a week.  2) NSVT/VT  - On Optivol no afib or NSVT. - Consider stopping Amiodarone next visit.  3) HTN-  - MAP elevated, however did not take medications yet. As above increase hydralazine. No Cleda Daub d/t he is already having issues with being slightly dry.  4) Anticoagulation with coumadin  - Continue coumadin and ASA 325 mg for LVAD. Check INR today and adjust coumadin accordingly.  5) LVAD  - LVAD parameters within normal ranges. .  - Check BMET, CBC, LDH, pro-BNP, prealbmumin and INR today.  - Remains on 1B list at [facility] and just seen last week by their team.   Patient completed Intermacs follow up today including 6 minute walk test and walked 1060 ft.   F/U 2 months Robert [name] B NP-C 9:55 AM

## 2013-12-26 NOTE — Patient Instructions (Addendum)
1.  No change in coumadin dose; re-check INR Monday 01/14/14. 2.  Increase Hydralazine to 200 mg twice daily. 3.  BP check in two weeks. 4.  Need ICD device check with Dr. Rubie Maid. 5.  Return to VAD clinic in 2 months.

## 2014-01-03 ENCOUNTER — Encounter: Payer: Self-pay | Admitting: Internal Medicine

## 2014-01-10 ENCOUNTER — Other Ambulatory Visit (HOSPITAL_COMMUNITY): Payer: Self-pay | Admitting: *Deleted

## 2014-01-10 DIAGNOSIS — Z7901 Long term (current) use of anticoagulants: Secondary | ICD-10-CM

## 2014-01-10 DIAGNOSIS — Z95811 Presence of heart assist device: Secondary | ICD-10-CM

## 2014-01-14 ENCOUNTER — Other Ambulatory Visit: Payer: BC Managed Care – PPO

## 2014-01-15 ENCOUNTER — Telehealth (HOSPITAL_COMMUNITY): Payer: Self-pay | Admitting: *Deleted

## 2014-01-15 ENCOUNTER — Other Ambulatory Visit (INDEPENDENT_AMBULATORY_CARE_PROVIDER_SITE_OTHER): Payer: BC Managed Care – PPO | Admitting: *Deleted

## 2014-01-15 ENCOUNTER — Ambulatory Visit (HOSPITAL_COMMUNITY): Payer: Self-pay | Admitting: *Deleted

## 2014-01-15 DIAGNOSIS — Z95811 Presence of heart assist device: Secondary | ICD-10-CM

## 2014-01-15 DIAGNOSIS — Z7901 Long term (current) use of anticoagulants: Secondary | ICD-10-CM

## 2014-01-15 LAB — PROTIME-INR
INR: 2.8 ratio — ABNORMAL HIGH (ref 0.8–1.0)
Prothrombin Time: 30.6 s — ABNORMAL HIGH (ref 9.6–13.1)

## 2014-01-15 NOTE — Telephone Encounter (Signed)
Called pt re: missed INR yesterday; pt states he will get checked today.

## 2014-01-25 ENCOUNTER — Encounter: Payer: Self-pay | Admitting: *Deleted

## 2014-01-31 ENCOUNTER — Encounter: Payer: Self-pay | Admitting: Cardiovascular Disease

## 2014-01-31 ENCOUNTER — Ambulatory Visit (INDEPENDENT_AMBULATORY_CARE_PROVIDER_SITE_OTHER): Payer: BC Managed Care – PPO | Admitting: Cardiovascular Disease

## 2014-01-31 VITALS — HR 73 | Resp 16 | Ht 74.0 in | Wt 243.1 lb

## 2014-01-31 DIAGNOSIS — Z8679 Personal history of other diseases of the circulatory system: Secondary | ICD-10-CM

## 2014-01-31 DIAGNOSIS — I270 Primary pulmonary hypertension: Secondary | ICD-10-CM

## 2014-01-31 DIAGNOSIS — I428 Other cardiomyopathies: Secondary | ICD-10-CM

## 2014-01-31 DIAGNOSIS — I472 Ventricular tachycardia, unspecified: Secondary | ICD-10-CM

## 2014-01-31 DIAGNOSIS — I272 Pulmonary hypertension, unspecified: Secondary | ICD-10-CM

## 2014-01-31 DIAGNOSIS — I4729 Other ventricular tachycardia: Secondary | ICD-10-CM

## 2014-01-31 DIAGNOSIS — I10 Essential (primary) hypertension: Secondary | ICD-10-CM

## 2014-01-31 DIAGNOSIS — Z95810 Presence of automatic (implantable) cardiac defibrillator: Secondary | ICD-10-CM

## 2014-01-31 DIAGNOSIS — I429 Cardiomyopathy, unspecified: Secondary | ICD-10-CM

## 2014-01-31 DIAGNOSIS — I5022 Chronic systolic (congestive) heart failure: Secondary | ICD-10-CM

## 2014-01-31 DIAGNOSIS — I5023 Acute on chronic systolic (congestive) heart failure: Secondary | ICD-10-CM

## 2014-01-31 LAB — MDC_IDC_ENUM_SESS_TYPE_INCLINIC
Battery Voltage: 2.84 V
Brady Statistic AP VP Percent: 0.1 % — CL
Brady Statistic AP VS Percent: 94.9 %
Brady Statistic AS VS Percent: 5 %
Lead Channel Impedance Value: 475 Ohm
Lead Channel Pacing Threshold Amplitude: 0.75 V
Lead Channel Pacing Threshold Amplitude: 1 V
Lead Channel Pacing Threshold Pulse Width: 0.4 ms
Lead Channel Sensing Intrinsic Amplitude: 1.6 mV
Lead Channel Sensing Intrinsic Amplitude: 9.4 mV
Lead Channel Setting Pacing Amplitude: 2.25 V
Lead Channel Setting Pacing Pulse Width: 0.4 ms
MDC IDC MSMT LEADCHNL RV IMPEDANCE VALUE: 437 Ohm
MDC IDC MSMT LEADCHNL RV PACING THRESHOLD PULSEWIDTH: 0.4 ms
MDC IDC SET LEADCHNL RA PACING AMPLITUDE: 1.5 V
MDC IDC SET LEADCHNL RV SENSING SENSITIVITY: 0.3 mV
MDC IDC SET ZONE DETECTION INTERVAL: 300 ms
MDC IDC SET ZONE DETECTION INTERVAL: 350.8 ms
MDC IDC SET ZONE DETECTION INTERVAL: 350.8 ms
MDC IDC STAT BRADY AS VP PERCENT: 0.1 % — AB
Zone Setting Detection Interval: 370.3 ms

## 2014-01-31 NOTE — Patient Instructions (Signed)
Remote monitoring is used to monitor your Pacemaker or ICD from home. This monitoring reduces the number of office visits required to check your device to one time per year. It allows [place] to monitor the functioning of your device to ensure it is working properly. You are scheduled for a device check from home on May 04, 2014. You may send your transmission at any time that day. If you have a wireless device, the transmission will be sent automatically. After your physician reviews your transmission, you will receive a postcard with your next transmission date.  Dr. Royann Shivers recommends that you schedule a follow-up appointment in: One year.

## 2014-01-31 NOTE — Progress Notes (Signed)
Patient ID: Robert [name], male   DOB: [birth year], 58 y.o.   MRN: [id number]      Reason for office visit ICD follow-up, severe dilated nonischemic cardiomyopathy, LVAD on chronic systolic heart failure, ventricular tachycardia  Roughly 18 months have passed since Robert [name] received his LVAD and he has done remarkably well. His activity level has remained constant and he does not have problems with shortness of breath, syncope or any recent heart failure exacerbation. He is occasionally troubled by palpitations and has had a couple of dizzy spells where he had to sit down.  Interrogation of his defibrillator (Medtronic Fleming 2011) shows 4 episodes of nonsustained ventricular tachycardia that occurred at the lower limit of VT detection (cycle length 400 ms). At least one of these episodes in September coincided with one of his dizzy spells. Therapy was not delivered. He is on low-dose amiodarone therapy. The most recent episode occurred September 24. Thoracic impedance readings are constant without any evidence of volume overload. There is 95% atrial pacing and no ventricular pacing.  Device function is normal. Estimated generator longevity is 12-18 months.  At other times he becomes dizzy when he bends over and it has been postulated that he may have some kinking of his outflow cannula  He is also receiving a high dose of ACE inhibitor as well as direct vasodilators, but is not on beta blockers. He is therapeutically anticoagulated and has not had any bleeding problems or embolic events related to his assist device.   Allergies  Allergen Reactions  . Imdur [Isosorbide] Other (See Comments)    Headache  . Metoprolol     Passes out    Current Outpatient Prescriptions  Medication Sig Dispense Refill  . allopurinol (ZYLOPRIM) 300 MG tablet Take 300 mg by mouth daily.      Marland Kitchen amiodarone (PACERONE) 200 MG tablet 200 mg daily.      Marland Kitchen amLODipine (NORVASC) 10 MG tablet Take 1 tablet (10 mg total)  by mouth daily.  30 tablet  6  . aspirin EC 325 MG tablet Take 1 tablet (325 mg total) by mouth daily.  30 tablet  6  . hydrALAZINE (APRESOLINE) 100 MG tablet Take 2 tablets (200 mg total) by mouth 2 (two) times daily.  120 tablet  6  . lisinopril (PRINIVIL,ZESTRIL) 20 MG tablet Take 20-40 mg by mouth daily. Take 2 tablets (40mg )in the morning, and 1 tablet (20mg ) in the evening.      . pantoprazole (PROTONIX) 40 MG tablet Take 1 tablet (40 mg total) by mouth daily.  90 tablet  3  . warfarin (COUMADIN) 5 MG tablet Take 5-7.5 mg by mouth daily. Take as directed for INR goal 2.0 - 3.0. Take 1 tablet (5mg ) all days except Friday take 1 1/2 tablet (7.5mg )       No current facility-administered medications for this visit.    Past Medical History  Diagnosis Date  . AICD (automatic cardioverter/defibrillator) present   . Hypertension   . Congestive heart failure   . Pneumonia   . Bronchitis   . Pulmonary hypertension     Past Surgical History  Procedure Laterality Date  . Cardiac defibrillator placement  06/03/11  . Cardiac catheterization    . Colonoscopy  12/29/2011    Procedure: COLONOSCOPY;  Surgeon: Iva Boop, MD;  Location: [facility] ENDOSCOPY;  Service: Endoscopy;  Laterality: N/A;  . Insertion of implantable left ventricular assist device N/A 06/02/2012    Procedure: INSERTION OF IMPLANTABLE LEFT VENTRICULAR ASSIST  DEVICE;  Surgeon: Kerin Perna, MD;  Location: [facility] OR;  Service: Open Heart Surgery;  Laterality: N/A;  Nitric Oxide; TEE; Medtronic AICD  . Intraoperative transesophageal echocardiogram N/A 06/02/2012    Procedure: INTRAOPERATIVE TRANSESOPHAGEAL ECHOCARDIOGRAM;  Surgeon: Kerin Perna, MD;  Location: [facility] OR;  Service: Open Heart Surgery;  Laterality: N/A;    No family history on file.  History   Social History  . Marital Status: Single    Spouse Name: N/A    Number of Children: 2  . Years of Education: N/A   Occupational History  . Disabled    Social History  Main Topics  . Smoking status: Never Smoker   . Smokeless tobacco: Never Used  . Alcohol Use: No  . Drug Use: No  . Sexual Activity: Not on file   Other Topics Concern  . Not on file   Social History Narrative  . No narrative on file    Review of systems: Dizzy Spells described above, occasional palpitations The patient specifically denies any chest pain at rest or with exertion, dyspnea at rest or with exertion, orthopnea, paroxysmal nocturnal dyspnea, syncope, palpitations, focal neurological deficits, intermittent claudication, lower extremity edema, unexplained weight gain, cough, hemoptysis or wheezing.  The patient also denies abdominal pain, nausea, vomiting, dysphagia, diarrhea, constipation, polyuria, polydipsia, dysuria, hematuria, frequency, urgency, abnormal bleeding or bruising, fever, chills, unexpected weight changes, mood swings, change in skin or hair texture, change in voice quality, auditory or visual problems, allergic reactions or rashes, new musculoskeletal complaints other than usual "aches and pains".   PHYSICAL EXAM Pulse 73  Resp 16  Ht 6\' 2"  (1.88 m)  Wt 243 lb 1.6 oz (110.269 kg)  BMI 31.20 kg/m2  General: Alert, oriented x3, no distress Head: no evidence of trauma, PERRL, EOMI, no exophtalmos or lid lag, no myxedema, no xanthelasma; normal ears, nose and oropharynx Neck: normal jugular venous pulsations and no hepatojugular reflux; brisk carotid pulses without delay and no carotid bruits Chest: clear to auscultation, no signs of consolidation by percussion or palpation, normal fremitus, symmetrical and full respiratory excursions Cardiovascular: normal position and quality of the apical impulse, regular rhythm, distantfirst and second heart sounds, constant machinery sound of LVAD, no rubs or gallops Abdomen: no tenderness or distention, no masses by palpation, no abnormal pulsatility or arterial bruits, normal bowel sounds, no  hepatosplenomegaly Extremities: no clubbing, cyanosis or edema; 1 radial, ulnar and brachial pulses bilaterally; 1+ right femoral, posterior tibial and dorsalis pedis pulses; 1+ left femoral, posterior tibial and dorsalis pedis pulses; no subclavian or femoral bruits Neurological: grossly nonfocal   EKG: Normal sinus rhythm, first-degree AV block, incomplete left bundle branch block, QRS 116 ms, QTC 519 ms  Lipid Panel     Component Value Date/Time   CHOL 206* 08/06/2011 0630   TRIG 121 08/06/2011 0630   HDL 50 08/06/2011 0630   CHOLHDL 4.1 08/06/2011 0630   VLDL 24 08/06/2011 0630   LDLCALC 132* 08/06/2011 0630    BMET    Component Value Date/Time   NA 139 12/26/2013 0901   K 4.0 12/26/2013 0901   CL 103 12/26/2013 0901   CO2 26 12/26/2013 0901   GLUCOSE 84 12/26/2013 0901   BUN 13 12/26/2013 0901   CREATININE 1.25 12/26/2013 0901   CREATININE 1.27 03/05/2013 1053   CALCIUM 8.9 12/26/2013 0901   GFRNONAA 62* 12/26/2013 0901   GFRAA 72* 12/26/2013 0901     ASSESSMENT AND PLAN  Well compensated chronic systolic heart  failure, NYHA functional class I-II with excellent LVAD function  Normal function of his defibrillator  Minor intraventricular conduction delay: If this progresses consider upgrade to a CRT-D device when it comes time for generator change out, if he has not had a transplant by then  Recurrent but infrequent episodes of brief nonsustained ventricular tachycardia. Although these are mildly symptomatic, adjustments of therapy zones does not appear necessary. He has not experienced syncope and it is preferable to avoid unnecessary shocks. The burden of arrhythmia appears to be relatively constant over time. There is room to increase amiodarone dose if necessary.  He is no longer on beta blockers. This was stopped when he received his LVAD. Will defer to Dr. Gala RomneyBensimhon whether or not this would be beneficial.  No complications on chronic anticoagulation with  warfarin  Robert [name],Robert [name]  Robert Jeangilles, MD, [facility] [phone] office [phone] pager

## 2014-02-05 ENCOUNTER — Other Ambulatory Visit (INDEPENDENT_AMBULATORY_CARE_PROVIDER_SITE_OTHER): Payer: BC Managed Care – PPO

## 2014-02-05 ENCOUNTER — Ambulatory Visit (HOSPITAL_COMMUNITY): Payer: Self-pay | Admitting: *Deleted

## 2014-02-05 DIAGNOSIS — Z95811 Presence of heart assist device: Secondary | ICD-10-CM

## 2014-02-05 DIAGNOSIS — Z7901 Long term (current) use of anticoagulants: Secondary | ICD-10-CM

## 2014-02-05 LAB — PROTIME-INR
INR: 3.8 ratio — ABNORMAL HIGH (ref 0.8–1.0)
Prothrombin Time: 40.7 s — ABNORMAL HIGH (ref 9.6–13.1)

## 2014-02-11 ENCOUNTER — Encounter: Payer: Self-pay | Admitting: Cardiovascular Disease

## 2014-02-16 NOTE — Clinical Social Work Psychosocial (Addendum)
[place] CLINICAL SOCIAL WORK DOCUMENTATION LVAD (Left Ventricular Assist Device) Psychosocial Screening Please remember that all information is confidential within the members of the VAD team and [facility]  05/31/2012 1:46:11 PM  Patient:  Robert [name]  MRN:  [id number]  Account:  [id number]  Clinical Social Worker:  [place], LCSW Date/Time Initiated:   05/31/2012 01:00 PM Referral Source:    Dr. Prescott Gum, Junie Bame, NP- [place], medical record  Referral Reason:   Placement of LVAD  Source of Information:   Patient, wife, medical record   PATIENT DEMOGRAPHICS NAME:   Robert [name]     DOB:  [birth year]  SS#:  [id number] Address:   [address]  [place] [address] Home Phone:  [phone]  Cell Phone:    Marital Status:   Single     Primary Language:  ENGLISH  Faith:  HOLINESS Adherence with Medical Regimen:   Excellent  Medication Adherence:   Excellent  Physician appointment attendance:   Excellent "I won't miss my appointments unless I was really sick"   Do you have a Living Will or Medical POA?  N Would you like to complete a Living Will and Medical POA prior to surgery?  N Are you currently a DNR?  N Do you have a MOST form?  N Would you like to review one?  Y Do you have goals of care?  Y   Have you had a consult with the palliative care team at [facility]?  N Comment:  Met with palliative nurse practioner and states questions were answered  Psychological Health Appearance:   Sitting up in bed, wearing hospital attire. Good hygiene and well groomed  Mental status:   Alert and oriented  Eye Contact:   Very good and appropriate  Thought Content:   Rational, organized, appropriate  Speech:   Clear, normal volume, easily understood  Mood:   Relaxed, appeared to be in good spirits- uitilized humor throughout the interview  Affect:   Full ranging and appropriate for mood  Insight:   Very good  Judgment:    Very good  Interaction Style:   Very good   Family/Social Information Who lives in your home? Name9  Lives alone      Do you have a plan for child care if relevant?   NA. Has a 10 year old son who lives with his mother.  Stays with him on the weekends but he can also stay with his mother on the weekends or with his parents.  List family members outside the home (parents, friends, pastor, etc..) Name2  Louann Liv" [place]   Age2  76  79  22  35  40   Relationship to [place]  Mother  Father  Sister  Oldest Son  Nephew    Please list people who give you emotional support (family, parents, friends, pastor, etc..) Name3  Same as above      Who is your primary and backup support pre and post-surgery? Explain the relationships i.e. strengths/weakness, etc.:   Primary caregiver is his sisterLelon Frohlich. She and patient are close and he feels he has a strong trust relationship with her.  Secondary Caregiver identified:   Parents will provide secondary support as will son and nephew   Legal Do you currently have any legal issues/problems?   None  Durable POA or Legal Next of Kin:   No  Durable POA  Next of Kin would be his mother and fatherPosey Pronto and [name]  [phone]   Living situation Travel distance to [facility]:  about 15 minutes  Second Hand Smoke Exposure:   None  Self- Care level:   Has been fully indepedent of his ADL's  Ambulation:   Fully ambulatory- no assistive device needed at time  Transportation:   Patient has a car and states that his primary and secondary caregivers all have cars  Limitations:   "When I try to walk very far I 'give out'"  Barriers impacting ability to participate in care:   None at this time   Community Are you active with community agencies/resources?   None  Are you active in a church, synagogue, mosque, or other faith based community?   Yes- very  active in his church  What other sources do you have for spiritual support?   Reads his bible, church members are supportive as is his pastor  Are you active in any clubs or social organizations?   None  What do you do for fun? hobbies, interests?  Drag Races, anything to do wiht cars or motorcycles. Loves watching basketball and once enjoyed playing.  Enjoys TV as well.   Education/Work information What is the last grade of school you completed?  12th  graduated  Preferred method of learning? (Written, verbal, hands on):   "Show me and let me try"  Do you have any problems with reading or writing?  None  Are you currently employed? If no, when were you last employed?   No- not currently employed. States he was able to work some until about 3 weeks ago but found he coudn't continue  Name of employer:  Please describe what kind of work you do/did?   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 At one time he states he was working 3 jobs but had to stop in 2012.  He also was working for a bank.  How long have you worked there?   "I've worked jobs all my life." [place] working about 3 weeks ago.  If you are not currently working, do you plan to return to work after VAD surgery?   "Yes- I am hoping to return to work as soon as possible"  If yes, what type of employment do you hope to find?   "I hope to go back to at least one of my jobs- preferably with the pharmacy"  Are you interested in job training or learning new skills?   No- not at this time but I might in the future."  Did you serve in the Military? If so, what branch?   No   Financial Information What is your source of income?    No income at this time.  Was bringing home approximately $550.00 per week with his job at the pharmacy and the job at the bank was about $450.00 per week base pluse extra for deliveries  Do you have difficulty meeting your monthly expenses?   "I will in the future but I was  managing before"  If yes, which ones?    NA  How do you usually cope with this?    "I will have to be very careful with my savings and budget- I will manage- I always do"  Primary Health Insurance:    [place] is $14.00 per month (obtained through the [facility]).  Secondary  Insurance:    None  Have you applied for Medicaid?    Not yet but will look into this  Have you applied for Social Security Disability?    Yes- Patients states that he has been seeking disabilitybut was recently denied for the 4th time.  He is planning to hire a disability attorney to help him and medicaid as well.  Do you have prescription coverage?    "Yes- with my BCBS"  What are your prescription co-pays?    "Depends on the drug but it has been manageable"  Are you required to use a certain pharmacy?    No  Do you have a mail order option for your prescriptions?    No  If yes, what pharmacy do you use for mail order?    NA  Have you ever refused medication due to cost?    No- not yet  Discuss monthly cost for dressing supplies post procedure $150-300    CSW and patient discussed costs of dressing supplies and he feels that this will be a hardship if he has to pay for them out of pocket.  He states that he was told by the VAD coordinator- Deatra Canter that they would be billing his BCBS and hopefully this will be covered; if not- then trying to get Medicaid.  Can you budget for this monthly expense?    "Not sure- things will be really tight but I know my family would help me some. " Patient related that he is also trying to sell some property  (a 2nd house and has several cars that he wants to sell."   Medical Information Briefly describe your medical history, surgeries and why you are here for evaluation.    "I really hadn't had any true problems until I got sick in 2011- before that- I felt great and was able to come and go as I wanted.  From there- I started having many issues with my heart and things  have deteriorated since.  Now they tell me that I need this VAD to help me to live but I am wanting to proceed with heart transplant.  (See H & P for further medical history)  Are you able to complete your ADL's?    Yes  Do you have any history of emotional, medical, physical or verbal trauma?    None  Do you have any family history of heart problems?    None  Do you smoke? If so, what is the amount and frequency?    Never smoked  Do you drink alcohol? If so, how many drinks a day/week?    No  Have you ever used illegal drugs or misused medications?    No  If yes, what drugs do you use and how often?    NA  Have you ever been treated for substance abuse?    No  If yes, where and when were you treated?    NA  Are you currently using illegal substances?    No   Mental Health History Have you ever had problems with depression, anxiety or other mental health issues?   No  If yes, have you seen a counselor, psychiatrist or therapist?    No  If you are currently experiencing problems are you interested in talking with a professional?    No  Would you be interested in participating in an LVAD support group?  Y LVAD support group for: Patient Caregivers patient Have you or are you taking any  medications for anxiety/depression or any mental health concerns?    No  If yes, Please list the medications?    NA  How have you been feeling in the past year?    "i've been feeling fair but seems that each week I feel worse.  I want to feel good again". I mostly just tire easily and this is frustrating."  How do you handle stressful situations?   "I'm pretty easy going- I try not to let things get to me."  What are your coping strategies? Please List:    Reads his bible, spends time with his son and family, "laughs"  Have you had any past or current thoughts of suicide?    No- never  How many hours do you sleep at night?    6-8  How is your appetite?   "Fairly good- somedays I am more  hungry than others."  PHQ2- Depression Screen:    0  PHQ9 Depression screen (only complete if the PHQ2 is positive):    NA   Plan for VAD Implementation Do you know and understand what happens during VAD surgery?  Please describe your thoughts:   Pateint was able to describe with significant clarity about the device and actual process of the surgery. He speaks without hesitation and appears relaxed when describing the surgery  What do you know about the risks of any major surgery or use of general anesthesia?    "I know I could die on the table (laughs) or make it through.  Any surgery has risks but when you are messing with your heart- you know it's serious!"  What do you know about the risks and side effects associated with VAD surgery?    "They told me I have to be really careful not to get any infections or anything and that I could always have a stroke or something.  The doctor has been really up front with me about how serious this is."  Explain what will happen right after surgery?    "I'll be sleeping alot and they told me I'd be on a breathing machine for a while then later I'll go to a recovery floor until I'm able to go home."  Information obtained from:    Patient, LVAD coordinator, MD  What is your plan for transportation for the first 8 weeks post-surgery? (Patients are not recommended to drive post-surgery for 8 weeks)    "My family will take me to my appointments- either my son or my nephew. They told me I can't drive for a while".  Driver: Son [place], [place] other family members  Valid license:    Yes- patient has current drivers' license  Working Conservator, museum/gallery:    Pateint has a [employer].  States that has current inspection sticker  Airbags:  Yes Do you plan to disarm the airbags- there is a risk of discharging the device if the airbag were to deploy.   No plans at this time. Discussed with patient that he should ride in back seat of car whenever  possible.  What do you know about your diet post-surgery?    "Some- I hope to talk to someone later. I usually cook for myself but I try to eat right."  How do you plan to monitor your medications, current and future?    "I handle my own medications but if I needed help- I'd ask my family." This has never been a problem for me.  How do you plan  to complete ADL's post-surgery Sales executive, dress, etc.)?    "I hope to do as much for myself as possible but at home- my sister will help with dressing changes; my son and nephew would help with personal things like getting a shower."  Will it be difficult to ask for help for your caregivers? If so, explain:    "No! I am very independent but I know i'm going to need help at first."  Please explain what you hope will be improved about your life as a result of receiving the VAD    "I will be able to move around again and hopefully go back to work. I'm tired of feeling so tired and that I can't do much for myself. It is frustrating. Also- I hope it will keep me going until I can get a transplant."  Please tell me your biggest concern or fear about receiving the VAD?    "Getting through the surgery is the first thing- I know this is very serious.  A little part of me is afraid I might not make it but I trust in God to get me through if it is meant to be."  How do you cope with your concerns/fears?    "I pray alot and read my bible; also I try to remember to laugh and count my blessings"  Please explain your understanding of how your body will change? Are you worried about these changes?    "I'm not worried about the changes- it's just me- I don't have a wife or anything so tubes and wires don't bother me."  Patient has spoken to a prior VAD recipient and was shown what the batteries and controller looks like.  Do you see any barriers to your surgery or follow up? If yes, please explain:   "No- none that I can see"     Understanding of LVAD Surgical procedures  and risks:    Discussed and reviewed  Electrical need for LVAD:    Discussed and reviewed. Has 3 prong plugs in his home. Patienht also has a safety plan should the power go off.  Safety precautions with LVAD (Water, etc.):   Discussed and reviewed  (no showers, no swimming,etc)  Potential side effects with LVAD:    Discussed and reviewed  Types of Advanced heart failure therapies available:    Discussed and reviewed.  On transplant list with [facility]  LVAD daily self-care (dressing changes, computer check, extra supplies):    Discussed and reviewed  Outpatient follow-up (follow-up in LVAD clinic; monitoring blood thinners)    Discussed and reviewed  Need for emergency planning:    Discussed and reviewed- patient has emergency back up plan in case of power outages  Expectations for LVAD:    Patient apperas to have very realistic expectations about the surgery and recovery. He has a very positive outlook.  Current level of motivation to prepare for LVAD:    Patient is anxious to process and appears very motivated to recover. Wants to proceed with transplant when he is stronger  Patient's perception of need for LVAD:    Accurate and realistic  Present level of consent for LVAD:    Patient is very agreeable and anxious to proceed with the VAD implantation  Reasons for seeking LVAD:    "I want to get better and to get a new heart. I want to live!"   Psychosocial Protective Factors  Very positive attitude and spirit- utilizes humor frequently Good coping skills No drug  or alcohol history Has reliable trasnportation Strong family support indicated Motivated and has realistic expectations of surgery and outcomes Has storng coping skills including a strong faith perspective No psychiatric history  noted Psychosocial Risk Factors  Lives alone Lost income due to inabiltiy to work Disability denied X's 4 May need financial support from family until disabilty is in place  Clinical  Intervention: CSW will monitor and assist/support patient during hospital recovery process. Will monitor for any signs/symptoms of depression or anxiety and provide appropriate intervention as needed.   Educated patient/family on the following Caregiver(s) role responsibilities:   Discussed with patient. CSW will monitor and support patient during hospital recovery process  Financial planning for LVAD:    Discussed and Reviewed  Role of Clinical Social Worker:    Discsused and Reviewed  Signs of depression and anxiety:    None noted at this time. Discussed signs/symptomes of depression and anxiety and will monitor and assist patient as needed  Support planning for LVAD:    Discussed and reviewed  LVAD process:    Discussed and reviewed- much of the clinical piece of the LVAD proceedure is being taught by the LVAD coordiantor and MD.  Caregiver contract/agreement:   Discussed and reviewed. CSW will provide ongoing support  Discussed Referral(s) to:    CSW to assess needs during recovery period  [employer] Resources:    Follow up with [place] re: Medicaid  CSW to assess needs during recovery period  Clinical Impression Recommendations:    Mr. Zick is a delightful, very pleasant 58 year old male who appears to be a very engaged and invovled in current need for surgery.  He is able to articulate a strong understanding of his surgery as well as the risk and benefits of same.  He feels he has good family support and is anxious to return to a prior level of independence and activity; wants to return to work. He appears very motifvated but also utilizes humor as a coping mechanism. HHe apperas to be an excellent psychosical candidate for LVAD implantation at this time.  Patient is hoping to proceed with heart transplant. Patient is currently without income and issues regarding disability must be addressed; patient is still hoping for recovery and return to work in the  future as he feels he can still be a viable and active peson. Will require follow up with DSS and [facility] financial services to monitor financial situation and needs. Lorie Phenix. [place], [place]

## 2014-02-22 ENCOUNTER — Other Ambulatory Visit (HOSPITAL_COMMUNITY): Payer: Self-pay | Admitting: Cardiology

## 2014-02-22 ENCOUNTER — Other Ambulatory Visit (HOSPITAL_COMMUNITY): Payer: Self-pay | Admitting: *Deleted

## 2014-02-22 DIAGNOSIS — Z7901 Long term (current) use of anticoagulants: Secondary | ICD-10-CM

## 2014-02-22 DIAGNOSIS — I5022 Chronic systolic (congestive) heart failure: Secondary | ICD-10-CM

## 2014-02-22 DIAGNOSIS — Z95811 Presence of heart assist device: Secondary | ICD-10-CM

## 2014-02-25 ENCOUNTER — Ambulatory Visit (HOSPITAL_COMMUNITY)
Admission: RE | Admit: 2014-02-25 | Discharge: 2014-02-25 | Disposition: A | Discharge status: Home / Self Care | Payer: BC Managed Care – PPO | Source: Ambulatory Visit | Attending: Internal Medicine | Admitting: Internal Medicine

## 2014-02-25 ENCOUNTER — Other Ambulatory Visit (HOSPITAL_COMMUNITY): Payer: Self-pay | Admitting: Internal Medicine

## 2014-02-25 ENCOUNTER — Ambulatory Visit (HOSPITAL_COMMUNITY): Payer: Self-pay | Admitting: Anesthesiology

## 2014-02-25 ENCOUNTER — Encounter: Payer: Self-pay | Admitting: Licensed Clinical Social Worker

## 2014-02-25 ENCOUNTER — Ambulatory Visit (HOSPITAL_BASED_OUTPATIENT_CLINIC_OR_DEPARTMENT_OTHER)
Admission: RE | Admit: 2014-02-25 | Discharge: 2014-02-25 | Disposition: A | Discharge status: Home / Self Care | Payer: BC Managed Care – PPO | Source: Ambulatory Visit | Attending: Internal Medicine | Admitting: Internal Medicine

## 2014-02-25 ENCOUNTER — Encounter (HOSPITAL_COMMUNITY): Payer: Self-pay

## 2014-02-25 VITALS — BP 128/1 | HR 88 | Wt 244.2 lb

## 2014-02-25 DIAGNOSIS — I509 Heart failure, unspecified: Secondary | ICD-10-CM

## 2014-02-25 DIAGNOSIS — I472 Ventricular tachycardia: Secondary | ICD-10-CM

## 2014-02-25 DIAGNOSIS — I5022 Chronic systolic (congestive) heart failure: Secondary | ICD-10-CM

## 2014-02-25 DIAGNOSIS — Z95811 Presence of heart assist device: Secondary | ICD-10-CM | POA: Insufficient documentation

## 2014-02-25 DIAGNOSIS — I10 Essential (primary) hypertension: Secondary | ICD-10-CM

## 2014-02-25 DIAGNOSIS — Z7901 Long term (current) use of anticoagulants: Secondary | ICD-10-CM

## 2014-02-25 DIAGNOSIS — I4729 Other ventricular tachycardia: Secondary | ICD-10-CM

## 2014-02-25 LAB — LACTATE DEHYDROGENASE: LDH: 262 U/L — ABNORMAL HIGH (ref 94–250)

## 2014-02-25 LAB — CBC
HCT: 43.5 % (ref 39.0–52.0)
Hemoglobin: 14.4 g/dL (ref 13.0–17.0)
MCH: 29.7 pg (ref 26.0–34.0)
MCHC: 33.1 g/dL (ref 30.0–36.0)
MCV: 89.7 fL (ref 78.0–100.0)
PLATELETS: 151 10*3/uL (ref 150–400)
RBC: 4.85 MIL/uL (ref 4.22–5.81)
RDW: 14.3 % (ref 11.5–15.5)
WBC: 5.8 10*3/uL (ref 4.0–10.5)

## 2014-02-25 LAB — BASIC METABOLIC PANEL
ANION GAP: 12 (ref 5–15)
BUN: 14 mg/dL (ref 6–23)
CALCIUM: 8.7 mg/dL (ref 8.4–10.5)
CO2: 25 meq/L (ref 19–32)
CREATININE: 1.17 mg/dL (ref 0.50–1.35)
Chloride: 103 mEq/L (ref 96–112)
GFR calc Af Amer: 78 mL/min — ABNORMAL LOW (ref >90)
GFR, EST NON AFRICAN AMERICAN: 67 mL/min — AB (ref >90)
Glucose, Bld: 84 mg/dL (ref 70–99)
Potassium: 4 mEq/L (ref 3.7–5.3)
Sodium: 140 mEq/L (ref 137–147)

## 2014-02-25 LAB — PROTIME-INR
INR: 2.69 — ABNORMAL HIGH (ref 0.00–1.49)
Prothrombin Time: 28.8 seconds — ABNORMAL HIGH (ref 11.6–15.2)

## 2014-02-25 LAB — PRO B NATRIURETIC PEPTIDE: Pro B Natriuretic peptide (BNP): 376.1 pg/mL — ABNORMAL HIGH (ref 0–125)

## 2014-02-25 MED ORDER — DOXAZOSIN MESYLATE 1 MG PO TABS: 1.0000 mg | ORAL_TABLET | Freq: Every day | ORAL | Status: DC

## 2014-02-25 MED ORDER — DOXYCYCLINE HYCLATE 100 MG PO TABS
200.0000 mg | ORAL_TABLET | Freq: Once | ORAL | Status: DC
Start: 2014-02-25 — End: 2014-03-15

## 2014-02-25 NOTE — Progress Notes (Signed)
Patient ID: DAVAUGHN MALLAK, male   DOB: December 19, 1955, 58 y.o.   MRN: [id number] RAMP ECHO  Speed  Flow  PI  Power  LVIDD  AI  Aortic openings  MR  TR  Septum  RV   9800 5.3 7.6 6.4 6.4 trivial Slightly q beat  trivial mod  midline mild  19622  5.4 7.4 6.7 6.35 trivial 5/5 barely with q beat trivial mod Slight L mild  29798 55.5 7.3 6.8 6.2 trivial 4/5 barely Trivial/mild mod Pull to L mild                                       Patients LVAD speed increased from 9800 to 10000, back-up speed increased to 9400. Reprogrammed back-up controller as well. Dr. Gala Romney present during RAMP Echo.

## 2014-02-25 NOTE — Patient Instructions (Signed)
Amiodarone 200mg  once daily.  Start Cardura 1mg  tablet once daily.  Doxcycline 200mg -Take 2 tablets ONCE.  Return in 3 months.  Happy Thanksgiving!  Do the following things EVERYDAY: 1) Weigh yourself in the morning before breakfast. Write it down and keep it in a log. 2) Take your medicines as prescribed 3) Eat low salt foods-Limit salt (sodium) to 2000 mg per day.  4) Stay as active as you can everyday 5) Limit all fluids for the day to less than 2 liters

## 2014-02-25 NOTE — Progress Notes (Signed)
CSW met with patient in the clinic to discuss ongoing financial issues. Patient reports that he is expecting word from Social Security about determination od application. Patient states he is struggling with increased costs of health insurance through marketplace. CSW will explore some financial assistance options  and meet with patient again to further discuss finances. Patient is grateful for assistance. CSW continues to follow for financial and supportive interventions. Raquel Sarna, [place]

## 2014-02-25 NOTE — Progress Notes (Signed)
Patient ID: Robert [name], male   DOB: 10/16/1955, 58 y.o.   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 status post dual chamber Medtronic ICD implanted April 2011. Cath 2011 showed normal coronaries. He also has a history of VTach. Currently listed as 1b list for heart tx.   Follow up for Heart Failure/LVAD: Last visit increased hydralazine to 200 mg BID which he tolerated. Denies SOB, orthopnea, PND or CP. Denies palpitations. Doing everything that he wants to do. Occasional DOE more than 1000 ft. Taking medications as prescribed. Having R knee pain which was drained and received cortisone shot about 3 months ago. Weight at home stable.  Remains on status 1B visit.   Denies LVAD alarms.  Denies driveline trauma, erythema or drainage.  Denies ICD shocks.   Reports taking Coumadin as prescribed and adherence to anticoagulation based dietary restrictions.  Denies bright red blood per rectum or melena, no dark urine or hematuria.     Past Medical History  Diagnosis Date  . AICD (automatic cardioverter/defibrillator) present   . Hypertension   . Congestive heart failure   . Pneumonia   . Bronchitis   . Pulmonary hypertension     Current Outpatient Prescriptions  Medication Sig Dispense Refill  . allopurinol (ZYLOPRIM) 300 MG tablet Take 300 mg by mouth daily.    Marland Kitchen amiodarone (PACERONE) 200 MG tablet 200 mg daily.    Marland Kitchen amLODipine (NORVASC) 10 MG tablet Take 1 tablet (10 mg total) by mouth daily. 30 tablet 6  . aspirin EC 325 MG tablet Take 1 tablet (325 mg total) by mouth daily. 30 tablet 6  . hydrALAZINE (APRESOLINE) 100 MG tablet Take 2 tablets (200 mg total) by mouth 2 (two) times daily. 120 tablet 6  . lisinopril (PRINIVIL,ZESTRIL) 20 MG tablet Take 20-40 mg by mouth daily. Take 2 tablets (40mg )in the morning, and 1 tablet (20mg ) in  the evening.    . pantoprazole (PROTONIX) 40 MG tablet Take 1 tablet (40 mg total) by mouth daily. 90 tablet 3  . warfarin (COUMADIN) 5 MG tablet Take 5-7.5 mg by mouth daily. Take as directed for INR goal 2.0 - 3.0. Take 1 tablet (5mg ) all days except Friday take 1 1/2 tablet (7.5mg )     No current facility-administered medications for this encounter.    Imdur and Metoprolol  REVIEW OF SYSTEMS: All systems negative except as listed in HPI, PMH and Problem list.   LVAD INTERROGATION:   HeartMate II LVAD:   Flow: 4.3 liters/min Speed: 9800 Power: 5.8 PI: 5.5 Alarms: None Events: 5 - 15 PI events daily; occasional days where he has 20-30 PI events Fixed Speed: 9800 Low speed limit: 9200  I reviewed the LVAD parameters from today, and compared the results to the patient's prior recorded data.  No programming changes were made.  The LVAD is functioning within specified parameters.  The patient performs LVAD self-test daily.  LVAD interrogation was negative for any significant power changes, alarms or PI events/speed drops.  LVAD equipment check completed and is in good working order.  Back-up equipment present.   LVAD education done on emergency procedures and precautions and reviewed exit site care.    Filed Vitals:   02/25/14 1022  BP: 128/1  Pulse: 88  Weight: 244 lb 4 oz (110.791 kg)  SpO2: 100%    Physical Exam: GENERAL: Well appearing, male who presents to clinic today in no acute distress. HEENT: normal  NECK: Supple, JVP flat;  2+ bilaterally, no bruits.  No lymphadenopathy or thyromegaly appreciated.   CARDIAC:  Mechanical heart sounds with LVAD hum present.  LUNGS:  Clear to auscultation bilaterally.  ABDOMEN:  Soft, round, nontender, positive bowel sounds x4.     LVAD exit site: well-healed and incorporated.  Dressing dry and intact.  No erythema or drainage.  Stabilization device present and accurately applied.  Driveline dressing is being changed daily per sterile  technique. EXTREMITIES:  Warm and dry, no cyanosis, clubbing, rash or edema  NEUROLOGIC:  Alert and oriented x 4.  Gait steady.  No aphasia.  No dysarthria.  Affect pleasant.       ASSESSMENT AND PLAN:    1) Chronic systolic HF, s/p HMII LVAD implant 05/2012. [facility]  - NYHA II symptoms and volume status stable. Not currently on any diuretics and on days that he has multiple PI events likely he is dry. Encouraged him to increase his fluid intake and to make sure he is drinking enough fluids.  - Optivol interrogated: No crossings over threshold and thoracic impedence stable. Patient activity 3-4 hrs a day.  - He is not on a BB. He was able to tolerate Toprol before LVAD, however while in the hospital with implant over a year ago had syncope on higher dose. Could try starting a low dose to help with MAP since it is 128 in the future. RAMP Echo today showed RV only mildly HK  - Continue lisinopril, norvasc and hydralazine at current doses. He is taking hydralazine BID and not TID d/t he can't remember TID. Intolerant of Imdur d/t headaches.  - MAP elevated. He has not been on spironolactone d/t concern for volume he has battled with hypovolemia. Will start doxazosin 1 mg daily and titrate up if MAP remains elevated.  - Reinforced the need and importance of daily weights, a low sodium diet, and fluid restriction (less than 2 L a day). Instructed to call the HF clinic if weight increases more than 3 lbs overnight or 5 lbs in a week.  2) NSVT/VT  - On Optivol no afib or NSVT. - Continue amiodarone 200 mg daily will not stop with history of NSVT 3) HTN-  - MAP elevated. As above will start doxazosin 1 mg daily. Titrate to 2mg  as tolerated.  4) Anticoagulation with coumadin  - Continue coumadin and ASA 325 mg for LVAD. Check INR today and adjust coumadin accordingly.  5) LVAD  - LVAD parameters within normal ranges. .  - Check BMET, CBC, LDH, pro-BNPand INR today.  - [facility] and just seen last week by their team.  F/U 3 months  Robert Potashosgrove, Robert [name] 10:25 AM  Patient seen and examined with Robert PotashAli Cosgrove, NP. We discussed all aspects of the encounter. I agree with the assessment and plan as stated above.   He is doing well. BPs remain quite elevated. Intolerant of several anti-HTN meds (very sensitive to b-blockers). Will start doxazosin. Ramp echo done (I supervised the whole study personally) and speed turned up 10,000. ICD interrogated personally and looks good. VAD parameters stable. [facility].   Sashia Campas,MD 3:10 PM

## 2014-02-25 NOTE — Progress Notes (Signed)
  Echocardiogram 2D Echocardiogram has been performed.  Mustaf Antonacci FRANCES 02/25/2014, 11:06 AM

## 2014-03-05 ENCOUNTER — Other Ambulatory Visit (HOSPITAL_COMMUNITY): Payer: Self-pay | Admitting: *Deleted

## 2014-03-05 DIAGNOSIS — Z95811 Presence of heart assist device: Secondary | ICD-10-CM

## 2014-03-05 DIAGNOSIS — Z7901 Long term (current) use of anticoagulants: Secondary | ICD-10-CM

## 2014-03-11 ENCOUNTER — Other Ambulatory Visit: Payer: BC Managed Care – PPO

## 2014-03-12 ENCOUNTER — Other Ambulatory Visit (INDEPENDENT_AMBULATORY_CARE_PROVIDER_SITE_OTHER): Payer: BC Managed Care – PPO | Admitting: *Deleted

## 2014-03-12 ENCOUNTER — Ambulatory Visit (HOSPITAL_COMMUNITY): Payer: Self-pay | Admitting: *Deleted

## 2014-03-12 DIAGNOSIS — Z95811 Presence of heart assist device: Secondary | ICD-10-CM

## 2014-03-12 DIAGNOSIS — Z7901 Long term (current) use of anticoagulants: Secondary | ICD-10-CM

## 2014-03-12 LAB — PROTIME-INR
INR: 2.2 ratio — AB (ref 0.8–1.0)
Prothrombin Time: 23.9 s — ABNORMAL HIGH (ref 9.6–13.1)

## 2014-03-14 ENCOUNTER — Encounter (HOSPITAL_COMMUNITY): Payer: Self-pay | Admitting: Cardiovascular Disease

## 2014-03-15 ENCOUNTER — Encounter: Payer: Self-pay | Admitting: Internal Medicine

## 2014-03-15 ENCOUNTER — Other Ambulatory Visit (HOSPITAL_COMMUNITY): Payer: Self-pay | Admitting: *Deleted

## 2014-03-15 ENCOUNTER — Ambulatory Visit (HOSPITAL_COMMUNITY)
Admission: RE | Admit: 2014-03-15 | Discharge: 2014-03-15 | Disposition: A | Discharge status: Home / Self Care | Payer: BC Managed Care – PPO | Source: Ambulatory Visit | Attending: Internal Medicine | Admitting: Internal Medicine

## 2014-03-15 ENCOUNTER — Ambulatory Visit (HOSPITAL_COMMUNITY)
Admission: RE | Admit: 2014-03-15 | Discharge: 2014-03-15 | Disposition: A | Discharge status: Home / Self Care | Payer: BC Managed Care – PPO | Source: Ambulatory Visit | Attending: Adult Health | Admitting: Adult Health

## 2014-03-15 VITALS — BP 106/0 | HR 70 | Ht 74.0 in | Wt 241.0 lb

## 2014-03-15 DIAGNOSIS — I472 Ventricular tachycardia, unspecified: Secondary | ICD-10-CM

## 2014-03-15 DIAGNOSIS — Z95810 Presence of automatic (implantable) cardiac defibrillator: Secondary | ICD-10-CM | POA: Diagnosis not present

## 2014-03-15 DIAGNOSIS — R51 Headache: Secondary | ICD-10-CM | POA: Insufficient documentation

## 2014-03-15 DIAGNOSIS — Z95811 Presence of heart assist device: Secondary | ICD-10-CM | POA: Diagnosis not present

## 2014-03-15 DIAGNOSIS — I10 Essential (primary) hypertension: Secondary | ICD-10-CM

## 2014-03-15 DIAGNOSIS — I5022 Chronic systolic (congestive) heart failure: Secondary | ICD-10-CM | POA: Diagnosis not present

## 2014-03-15 DIAGNOSIS — I5023 Acute on chronic systolic (congestive) heart failure: Secondary | ICD-10-CM

## 2014-03-15 DIAGNOSIS — Z4509 Encounter for adjustment and management of other cardiac device: Secondary | ICD-10-CM | POA: Insufficient documentation

## 2014-03-15 DIAGNOSIS — R42 Dizziness and giddiness: Secondary | ICD-10-CM | POA: Diagnosis not present

## 2014-03-15 DIAGNOSIS — I429 Cardiomyopathy, unspecified: Secondary | ICD-10-CM

## 2014-03-15 DIAGNOSIS — I428 Other cardiomyopathies: Secondary | ICD-10-CM

## 2014-03-15 DIAGNOSIS — Z79899 Other long term (current) drug therapy: Secondary | ICD-10-CM | POA: Diagnosis not present

## 2014-03-15 DIAGNOSIS — Z7901 Long term (current) use of anticoagulants: Secondary | ICD-10-CM | POA: Insufficient documentation

## 2014-03-15 DIAGNOSIS — R55 Syncope and collapse: Secondary | ICD-10-CM | POA: Diagnosis not present

## 2014-03-15 DIAGNOSIS — Z7982 Long term (current) use of aspirin: Secondary | ICD-10-CM | POA: Insufficient documentation

## 2014-03-15 LAB — BASIC METABOLIC PANEL
Anion gap: 12 (ref 5–15)
BUN: 12 mg/dL (ref 6–23)
CHLORIDE: 103 meq/L (ref 96–112)
CO2: 25 mEq/L (ref 19–32)
Calcium: 8.7 mg/dL (ref 8.4–10.5)
Creatinine, Ser: 1.25 mg/dL (ref 0.50–1.35)
GFR calc non Af Amer: 62 mL/min — ABNORMAL LOW (ref >90)
GFR, EST AFRICAN AMERICAN: 72 mL/min — AB (ref >90)
Glucose, Bld: 117 mg/dL — ABNORMAL HIGH (ref 70–99)
POTASSIUM: 4 meq/L (ref 3.7–5.3)
SODIUM: 140 meq/L (ref 137–147)

## 2014-03-15 LAB — LACTATE DEHYDROGENASE: LDH: 210 U/L (ref 94–250)

## 2014-03-15 MED ORDER — HYDRALAZINE HCL 100 MG PO TABS: 100.0000 mg | ORAL_TABLET | Freq: Three times a day (TID) | ORAL | Status: DC

## 2014-03-15 NOTE — Progress Notes (Signed)
Patient ID: Robert [name], male   DOB: 02-14-1956, 58 y.o.   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 status post dual chamber Medtronic ICD implanted April 2011. Cath 2011 showed normal coronaries. He also has a history of VTach. Currently listed as 1b list for heart tx.   Follow up for Heart Failure/LVAD: Here for an acute visit due to dizziness.  Last visit cardura was added and LVAD speed was increased to 10,000.Overall feeling bad and having palpitations and dizziness. Says he is having dizziness more often over the last week.  Every time he bends over he is dizzy. Weight at home stable 240 pounds. Appetite ok.Taking all medications.  Remains on status 1B visit.   Denies LVAD alarms.  Denies driveline trauma, erythema or drainage.  Denies ICD shocks.Reports taking Coumadin as prescribed and adherence to anticoagulation based dietary restrictions.  Denies bright red blood per rectum or melena, no dark urine or hematuria.     Past Medical History  Diagnosis Date  . AICD (automatic cardioverter/defibrillator) present   . Hypertension   . Congestive heart failure   . Pneumonia   . Bronchitis   . Pulmonary hypertension     Current Outpatient Prescriptions  Medication Sig Dispense Refill  . allopurinol (ZYLOPRIM) 300 MG tablet Take 300 mg by mouth daily.    Marland Kitchen amiodarone (PACERONE) 200 MG tablet 200 mg daily.    Marland Kitchen amLODipine (NORVASC) 10 MG tablet Take 1 tablet (10 mg total) by mouth daily. 30 tablet 6  . aspirin EC 325 MG tablet Take 1 tablet (325 mg total) by mouth daily. 30 tablet 6  . doxazosin (CARDURA) 1 MG tablet Take 1 tablet (1 mg total) by mouth daily. 90 tablet 3  . hydrALAZINE (APRESOLINE) 100 MG tablet Take 2 tablets (200 mg total) by mouth 2 (two) times daily. 120 tablet 6  . lisinopril (PRINIVIL,ZESTRIL) 20 MG tablet Take  20-40 mg by mouth daily. Take 2 tablets (40mg )in the morning, and 1 tablet (20mg ) in the evening.    . pantoprazole (PROTONIX) 40 MG tablet TAKE 1 TABLET BY MOUTH EVERY DAY 90 tablet 0  . warfarin (COUMADIN) 5 MG tablet Take 5-7.5 mg by mouth daily. Take as directed for INR goal 2.0 - 3.0. Take 1 tablet (5mg ) all days except Friday take 1 1/2 tablet (7.5mg )     No current facility-administered medications for this encounter.    Imdur and Metoprolol  REVIEW OF SYSTEMS: All systems negative except as listed in HPI, PMH and Problem list.   LVAD INTERROGATION:   HeartMate II LVAD:   Flow: 4.4 liters/min Speed: 10000 Power: 5.8 PI: 5.9 Alarms: None Events: 50-60 per day for the last 2 days  Fixed Speed: 10000 Low speed limit: 9400  I reviewed the LVAD parameters from today, and compared the results to the patient's prior recorded data.  No programming changes were made.  The LVAD is functioning within specified parameters.  The patient performs LVAD self-test daily.  LVAD interrogation was negative for any significant power changes, alarms or PI events/speed drops.  LVAD equipment check completed and is in good working order.  Back-up equipment present.   LVAD education done on emergency procedures and precautions and reviewed exit site care.    Filed Vitals:   03/15/14 1025  BP: 106/0  Height: 6\' 2"  (1.88 m)  Weight: 241 lb (109.317 kg)    Physical Exam: GENERAL: Well appearing, male who presents to clinic today in no acute distress. HEENT: normal  NECK: Supple, JVP flat;  2+ bilaterally, no bruits.  No lymphadenopathy or thyromegaly appreciated.   CARDIAC:  Mechanical heart sounds with LVAD hum present.  LUNGS:  Clear to auscultation bilaterally.  ABDOMEN:  Soft, round, nontender, positive bowel sounds x4.     LVAD exit site: well-healed and incorporated.  Dressing dry and intact.  No erythema or drainage.  Stabilization device present and accurately applied.  Driveline dressing  is being changed daily per sterile technique. EXTREMITIES:  Warm and dry, no cyanosis, clubbing, rash or edema  NEUROLOGIC:  Alert and oriented x 4.  Gait steady.  No aphasia.  No dysarthria.  Affect pleasant.      EKG: SR 70 bpm    ASSESSMENT AND PLAN:    1) Chronic systolic HF, s/p HMII LVAD implant 05/2012. [facility]  - NYHA II symptoms and volume status stable. - Optivol interrogated: No crossings over threshold.  and thoracic impedence stable. Patient activity 3-4 hrs a day.  - He is not on a BB. He was able to tolerate Toprol before LVAD, however while in the hospital with implant over a year ago had syncope on higher dose.  - Continue lisinopril, norvasc and hydralazine at current doses. He is taking hydralazine tid. Intolerant of Imdur d/t headaches.  - MAP 106  Continue doxazosin 1 mg daily.  Could go up however he wants to hold off.  - Reinforced the need and importance of daily weights, a low sodium diet, and fluid restriction (less than 2 L a day). Instructed to call the HF clinic if weight increases more than 3 lbs overnight or 5 lbs in a week.  2) NSVT/VT  - On Optivol no afib or NSVT however when his device was interrogated wit medtronic representative he was able to reproduce VT when he was bending over.  Continue amiodarone 200 mg daily will not stop with history of NSVT 3) HTN-  - MAP elevated. Continue current medications.   4) Anticoagulation with coumadin  - Continue coumadin and ASA 325 mg for LVAD. INR was therapeutic on 03/12/14 INR 2.2 . Continue current regimen.  5) LVAD  - LVAD speed recently increased and he is having has increaed PI events noted. Will cut back speed to 9800 with slow speed set at 9200.    - Check BMET, LDH - [facility] and just seen last week by their team.   CLEGG,AMY NP-C 10:48 AM

## 2014-03-15 NOTE — Progress Notes (Unsigned)
Symptom  Yes  No  Details   Angina       x Activity:   Claudication       x How far:   Syncope       x When:  dizziness with orthostatic changes (especially bending over)  Stroke       x   Orthopnea       x How many pillows:  2 for comfort  PND       x How often:  CPAP       N/A How many hrs:   Pedal edema       x   Abd fullness       x   N&V       x   Diaphoresis       x When:  Bleeding      x   Urine color    light yellow  SOB       x Activity:   Palpitations        x      When: weekly  ICD shock       x   Hospitlizaitons       x When/where/why:  ED visit       x When/where/why:  Other MD            x When/who/why:    Activity        Decreased due increased dizziness, lightheadedness, and weakness  Fluid      No limitations  Diet      No limitations   Vital signs: HR:  150; 70 MAP BP:  106 O2 Sat:  96 Wt:  241 lbs Last wt:  2444   lbs Ht:  6'2"  LVAD interrogation reveals:  Speed:  10,000 Flow:   6.0 Power:  7.1 PI:  5.4 Alarms:  Few low voltage advisories Events:  10 - 20 PI events; 60 PI events 12/9 and 12/10 Fixed speed:  10,000 Low speed limit:  9400 Primary Controller:  Replace back up battery in 19 months (Jan/2017) Back up controller:   Replace back up battery in 19 months (Jan/2017).   LVAD exit site:  Well healed and incorporated. The velour is fully implanted at exit site. Dressing dry and intact. No erythema or drainage. Stabilization device present and accurately applied. Driveline dressing is being changed q 3 - 5 days per sterile technique using Sorbaview dressing with biopatch on exit site. Pt denies fever or chills. Pt has adequate dressing supplies. Driveline has tear in outer silicone shield. Pt will purchase rescue tape and bring to clinic for assistance in repairing driveline.  Pt denies any alarms or VAD equipment issues. Pt is completing weekly and monthly maintenance for LVAD equipment. LVAD equipment check completed and is in good working  order. Back-up equipment present. LVAD education done on emergency procedures and precautions and reviewed exit site care.  Pt c/o feeling lightheaded/dizzy/weak since VAD speed increased. Noted increase in symptoms over last few days; also c/o palpitations when bending over or lifting any heavy options. Optivol completed; EKG obtained; Medtronic device interrogated. 10 beat run VT noted during ICD interrogation with bending over and returning to standing position; Returned to A paced V sensed rhythm when patient sat down. CXR obtained to check inflow cannula position.   Primary and back up controller reprogrammed to fixed speed 9800 RPM with low speed limit 9200 RPM with following parameters:  Speed: 9800 Flow:   5.5 Power:  6.0 PI:  5.5  Pt will call VAD coordinator if symptoms persist or worsen.  He will return to VAD clinic for assistance with taping of driveline. Pt will need to return to VAD clinic in one month per Amy Clegg, NP-C.

## 2014-03-16 NOTE — Addendum Note (Signed)
Encounter addended by: Simon Rhein, CCT on: 03/16/2014  1:41 PM<BR>     Documentation filed: Charges VN

## 2014-03-26 ENCOUNTER — Telehealth (HOSPITAL_COMMUNITY): Payer: Self-pay | Admitting: *Deleted

## 2014-03-26 ENCOUNTER — Other Ambulatory Visit (INDEPENDENT_AMBULATORY_CARE_PROVIDER_SITE_OTHER): Payer: BC Managed Care – PPO | Admitting: *Deleted

## 2014-03-26 DIAGNOSIS — Z95811 Presence of heart assist device: Secondary | ICD-10-CM

## 2014-03-26 DIAGNOSIS — Z7901 Long term (current) use of anticoagulants: Secondary | ICD-10-CM

## 2014-03-26 LAB — PROTIME-INR
INR: 2.9 ratio — AB (ref 0.8–1.0)
Prothrombin Time: 31.3 s — ABNORMAL HIGH (ref 9.6–13.1)

## 2014-03-26 NOTE — Telephone Encounter (Signed)
Called pt re: missed INR today; he will get checked tomorrow.

## 2014-03-27 ENCOUNTER — Ambulatory Visit (HOSPITAL_COMMUNITY): Payer: Self-pay | Admitting: Infectious Diseases

## 2014-04-01 ENCOUNTER — Encounter: Payer: Self-pay | Admitting: Licensed Clinical Social Worker

## 2014-04-01 NOTE — Progress Notes (Signed)
CSW met with patient in the clinic. Patient reports that he is struggling to pay electric and gas bills. Patient reports he was denied SSI and will appeal. CSW advised patient to seek assistance with appeal through [employer]. CSW provided information and contact for [employer]. Patient also shared difficulties with current bills. CSW discussed various options in the community for financial assistance. Patient will follow up and return call to CSW if further assistance needed. CSW continues to be available for support and community resources. Jackie Brennan, LCSW [phone] 

## 2014-04-02 ENCOUNTER — Other Ambulatory Visit (HOSPITAL_COMMUNITY): Payer: Self-pay | Admitting: Internal Medicine

## 2014-04-03 ENCOUNTER — Other Ambulatory Visit (HOSPITAL_COMMUNITY): Payer: Self-pay | Admitting: *Deleted

## 2014-04-03 DIAGNOSIS — I5022 Chronic systolic (congestive) heart failure: Secondary | ICD-10-CM

## 2014-04-03 DIAGNOSIS — I420 Dilated cardiomyopathy: Secondary | ICD-10-CM

## 2014-04-03 DIAGNOSIS — Z95811 Presence of heart assist device: Secondary | ICD-10-CM

## 2014-04-03 MED ORDER — LISINOPRIL 20 MG PO TABS
20.0000 mg | ORAL_TABLET | Freq: Every day | ORAL | Status: DC
Start: 2014-04-03 — End: 2014-05-15

## 2014-04-03 MED ORDER — PANTOPRAZOLE SODIUM 40 MG PO TBEC: 40.0000 mg | DELAYED_RELEASE_TABLET | Freq: Every day | ORAL | Status: DC

## 2014-04-04 ENCOUNTER — Encounter: Payer: Self-pay | Admitting: Internal Medicine

## 2014-04-04 ENCOUNTER — Other Ambulatory Visit (HOSPITAL_COMMUNITY): Payer: Self-pay

## 2014-04-04 MED ORDER — AMIODARONE HCL 200 MG PO TABS: 200.0000 mg | ORAL_TABLET | Freq: Every day | ORAL | Status: DC

## 2014-04-04 MED ORDER — ALLOPURINOL 300 MG PO TABS: 300.0000 mg | ORAL_TABLET | Freq: Every day | ORAL | Status: DC

## 2014-04-08 ENCOUNTER — Encounter: Payer: Self-pay | Admitting: Licensed Clinical Social Worker

## 2014-04-08 NOTE — Progress Notes (Signed)
Patient attempted to obtain assistance from [facility] for emergency utility assistance and was directed to apply at [employer]. Patient stated that he was informed that a shut off notice is needed in order to qualify for assistance. CSW assisted with letter documenting LVAD and electrical need in hopes of obtaining assistance and avoiding a shut off notice. Patient will return to [employer] for further assistance. [name], LCSW [phone]

## 2014-04-10 ENCOUNTER — Telehealth: Payer: Self-pay | Admitting: Licensed Clinical Social Worker

## 2014-04-10 NOTE — Telephone Encounter (Signed)
CSW received call from patient who reports that his [employer] bill will be paid by fund thru [employer]. Patient thrilled with assistance and will follow up with CSW regarding assistance for propane assistance for heating system. CSW will continue to follow for financial resources. [name], LCSW [phone]

## 2014-04-16 ENCOUNTER — Other Ambulatory Visit (HOSPITAL_COMMUNITY): Payer: Self-pay | Admitting: Infectious Diseases

## 2014-04-16 DIAGNOSIS — Z7901 Long term (current) use of anticoagulants: Secondary | ICD-10-CM

## 2014-04-16 DIAGNOSIS — Z95811 Presence of heart assist device: Secondary | ICD-10-CM

## 2014-04-17 ENCOUNTER — Encounter (HOSPITAL_COMMUNITY): Payer: Self-pay

## 2014-04-17 ENCOUNTER — Ambulatory Visit (HOSPITAL_COMMUNITY)
Admission: RE | Admit: 2014-04-17 | Discharge: 2014-04-17 | Disposition: A | Discharge status: Home / Self Care | Payer: Medicaid Other | Source: Ambulatory Visit | Attending: Internal Medicine | Admitting: Internal Medicine

## 2014-04-17 ENCOUNTER — Ambulatory Visit (HOSPITAL_COMMUNITY): Payer: Self-pay | Admitting: Infectious Diseases

## 2014-04-17 ENCOUNTER — Encounter: Payer: Self-pay | Admitting: Licensed Clinical Social Worker

## 2014-04-17 VITALS — BP 90/0 | HR 74 | Wt 248.2 lb

## 2014-04-17 DIAGNOSIS — I472 Ventricular tachycardia, unspecified: Secondary | ICD-10-CM

## 2014-04-17 DIAGNOSIS — Z95811 Presence of heart assist device: Secondary | ICD-10-CM | POA: Diagnosis not present

## 2014-04-17 DIAGNOSIS — Z5181 Encounter for therapeutic drug level monitoring: Secondary | ICD-10-CM | POA: Diagnosis not present

## 2014-04-17 DIAGNOSIS — I5022 Chronic systolic (congestive) heart failure: Secondary | ICD-10-CM | POA: Insufficient documentation

## 2014-04-17 DIAGNOSIS — I10 Essential (primary) hypertension: Secondary | ICD-10-CM | POA: Insufficient documentation

## 2014-04-17 DIAGNOSIS — Z7901 Long term (current) use of anticoagulants: Secondary | ICD-10-CM | POA: Insufficient documentation

## 2014-04-17 LAB — BASIC METABOLIC PANEL
Anion gap: 6 (ref 5–15)
BUN: 12 mg/dL (ref 6–23)
CALCIUM: 8.7 mg/dL (ref 8.4–10.5)
CHLORIDE: 101 meq/L (ref 96–112)
CO2: 27 mmol/L (ref 19–32)
CREATININE: 1.33 mg/dL (ref 0.50–1.35)
GFR calc Af Amer: 67 mL/min — ABNORMAL LOW (ref >90)
GFR, EST NON AFRICAN AMERICAN: 57 mL/min — AB (ref >90)
GLUCOSE: 86 mg/dL (ref 70–99)
POTASSIUM: 3.6 mmol/L (ref 3.5–5.1)
Sodium: 134 mmol/L — ABNORMAL LOW (ref 135–145)

## 2014-04-17 LAB — CBC
HEMATOCRIT: 41.5 % (ref 39.0–52.0)
Hemoglobin: 14.1 g/dL (ref 13.0–17.0)
MCH: 29.3 pg (ref 26.0–34.0)
MCHC: 34 g/dL (ref 30.0–36.0)
MCV: 86.3 fL (ref 78.0–100.0)
Platelets: 187 10*3/uL (ref 150–400)
RBC: 4.81 MIL/uL (ref 4.22–5.81)
RDW: 13.9 % (ref 11.5–15.5)
WBC: 6.3 10*3/uL (ref 4.0–10.5)

## 2014-04-17 LAB — PROTIME-INR
INR: 2.21 — ABNORMAL HIGH (ref 0.00–1.49)
Prothrombin Time: 24.7 seconds — ABNORMAL HIGH (ref 11.6–15.2)

## 2014-04-17 LAB — LACTATE DEHYDROGENASE: LDH: 194 U/L (ref 94–250)

## 2014-04-17 NOTE — Progress Notes (Signed)
Patient ID: Robert [name], male   DOB: 10-Sep-1955, 59 y.o.   MRN: [id number] PCP: N/A  HPI: 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 status post dual chamber Medtronic ICD implanted April 2011. Cath 2011 showed normal coronaries. He also has a history of VTach. Currently listed as 1b list for heart tx at [facility].   Follow up for Heart Failure/LVAD: Complaining of fatigue and bendopnea.  Denies SOB/PND/Orthopnea. Weight at home 242 trending up to 248 pounds. Every time he bends over he is dizzy. Riding stationary bike 15-20 minutes per day. Appetite ok.Taking all medications.  Remains on status 1B visit.   Denies LVAD alarms.  Denies driveline trauma, erythema or drainage.  Denies ICD shocks.Reports taking Coumadin as prescribed and adherence to anticoagulation based dietary restrictions.  Denies bright red blood per rectum or melena, no dark urine or hematuria.     Past Medical History  Diagnosis Date  . AICD (automatic cardioverter/defibrillator) present   . Hypertension   . Congestive heart failure   . Pneumonia   . Bronchitis   . Pulmonary hypertension     Current Outpatient Prescriptions  Medication Sig Dispense Refill  . allopurinol (ZYLOPRIM) 300 MG tablet TAKE ONE (1) TABLET EACH DAY 30 tablet 6  . allopurinol (ZYLOPRIM) 300 MG tablet Take 1 tablet (300 mg total) by mouth daily. 90 tablet 3  . amiodarone (PACERONE) 200 MG tablet Take 1 tablet (200 mg total) by mouth daily. 90 tablet 3  . amLODipine (NORVASC) 10 MG tablet TAKE ONE (1) TABLET EACH DAY 30 tablet 6  . aspirin EC 325 MG tablet Take 1 tablet (325 mg total) by mouth daily. 30 tablet 6  . doxazosin (CARDURA) 1 MG tablet Take 1 tablet (1 mg total) by mouth daily. 90 tablet 3  . hydrALAZINE (APRESOLINE) 100 MG tablet Take 1 tablet (100 mg total) by mouth 3 (three) times daily. 120 tablet 6  . lisinopril  (PRINIVIL,ZESTRIL) 20 MG tablet Take 1-2 tablets (20-40 mg total) by mouth daily. Take 2 tablets ( )in the morning, and 1 tablet ( ) in the evening. 90 tablet 6  . pantoprazole (PROTONIX) 40 MG tablet Take 1 tablet (40 mg total) by mouth daily. 30 tablet 6  . warfarin (COUMADIN) 5 MG tablet Take 5-7.5 mg by mouth daily. Take as directed for INR goal 2.0 - 3.0. Take 1 tablet ( ) all days except Friday take 1 1/2 tablet (7.5mg )     No current facility-administered medications for this encounter.    Imdur and Metoprolol  REVIEW OF SYSTEMS: All systems negative except as listed in HPI, PMH and Problem list.   LVAD INTERROGATION:   HeartMate II LVAD:   Flow: 4.4 liters/min Speed: 9800 Power: 5.8 PI: 5.9 Alarms: None Events: 30 yesterday.   Fixed Speed: 9800 Low speed limit: 9200  I reviewed the LVAD parameters from today, and compared the results to the patient's prior recorded data.  No programming changes were made.  The LVAD is functioning within specified parameters.  The patient performs LVAD self-test daily.  LVAD interrogation was negative for any significant power changes, alarms or PI events/speed drops.  LVAD equipment check completed and is in good working order.  Back-up equipment present.   LVAD education done on emergency procedures and precautions and reviewed exit site care.    Filed Vitals:   04/17/14  1015 04/17/14 1056  BP: 102/0 90/0  Pulse: 74   Weight: 248 lb 3.2 oz (112.583 kg)   SpO2: 100%     Physical Exam: GENERAL: Well appearing, male who presents to clinic today in no acute distress. HEENT: normal  NECK: Supple, JVP flat;  2+ bilaterally, no bruits.  No lymphadenopathy or thyromegaly appreciated.   CARDIAC:  Mechanical heart sounds with LVAD hum present.  LUNGS:  Clear to auscultation bilaterally.  ABDOMEN:  Soft, round, nontender, positive bowel sounds x4.     LVAD exit site: well-healed and incorporated.  Dressing dry and intact.  No erythema  or drainage.  Stabilization device present and accurately applied.  Driveline dressing is being changed daily per sterile technique. EXTREMITIES:  Warm and dry, no cyanosis, clubbing, rash or edema  NEUROLOGIC:  Alert and oriented x 4.  Gait steady.  No aphasia.  No dysarthria.  Affect pleasant.      ASSESSMENT AND PLAN:   1) Chronic systolic HF, s/p HMII LVAD implant 05/2012. Currently 1B at [facility].  - NYHA II symptoms and volume status stable. - Optivol interrogated: No crossings over threshold however has been trending up for the last few weeks. Volume status mildly elevated. Instructed to take 40 mg lasix for the next 2 days and contact HF clinic if he doesn't improve.  - He is not on a BB. He was able to tolerate Toprol before LVAD, however while in the hospital with implant over a year ago had syncope on higher dose.  - Continue lisinopril, norvasc, doxazosin, and hydralazine at current doses.  Intolerant of Imdur d/t headaches.  - MAP 90    - Reinforced the need and importance of daily weights, a low sodium diet, and fluid restriction (less than 2 L a day). Instructed to call the HF clinic if weight increases more than 3 lbs overnight or 5 lbs in a week.  2) NSVT/VT  - On Optivol no afib or NSVT. Will ask medtronic to interrogate device at next visit.  Continue amiodarone 200 mg daily will not stop with history of NSVT 3) HTN-  - MAP 90.  Continue current medications.   4) Anticoagulation with coumadin  - Continue coumadin and ASA 325 mg for LVAD. Check INR and adjust coumadin accordingly per HF pharmacy.   5) LVAD  - Doing ok with speed at 9800. As above mild volume overload noted. Plan to continue current speed.  Check BMET, LDH, INR, CBC  Remains on 1B list at [facility] and just seen last week by their team. Plan to dual list at [facility].    CLEGG,AMY NP-C 1:33 PM

## 2014-04-17 NOTE — Progress Notes (Signed)
Symptom Yes No Details  Angina  x Activity:  Claudication  x How far:  Syncope x  When: Dizziness whenever he bends over.   Stroke  x   Orthopnea  x How many pillows:  PND  x How often:  CPAP   How many hrs:  Pedal edema  x   Abd fullness  x   N&V  x   Diaphoresis  x When:  Bleeding  x None  Urine  x Clear yellow  SOB  x Activity:  Palpitations x  When:bending over   ICD shock  x   Hospitlizaitons  x When/where/why:  ED visit  x When/where/why:  Other MD x  When/who/why:F/U last week with [facility] for transplant  Activity x  Rides stationary bike 15-20 min every day.   Fluid x  > 2L  Diet x  More salt than should have lately     BP: 102 at first, rechecked at 90  Weight: 248.2 lb HR:  74 SPO2: 100% Last weight:  241.0 (03/15/14)  VAD interrogation revealed: Speed:  9800  Flow:  5.1 Power:  9800 PI: 6.2 Alarms: none   Events: 5-10 events daily with 31 events on 04/15/14  Fixed speed: 9800 Low speed limit: 9200 Primary Controller:  Replace back up battery in  __18___  Months. (January 2017) Back up controller:   Replace back up battery in  __18___  Months. (January 2017)  I reviewed the LVAD parameters from today, and compared the results to the patient's prior recorded data.  No programming changes were made.  The LVAD is functioning within specified parameters.  The patient performs LVAD self-test daily.  LVAD interrogation was negative for any significant power changes, alarms or PI events/speed drops.  LVAD equipment check completed and is in good working order.  Back-up equipment present.   LVAD education done on emergency procedures and precautions and reviewed exit site care.   Drive line exit site well healed and incorporated. The velour is fully implanted at exit site. Dressing dry and intact. No erythema or drainage. Stabilization device present and accurately applied. Driveline dressing is being changed weekly himself per sterile technique using Sorbaview dressing  with biopatch on exit site. Pt denies fever or chills. Provided patient with 8 weekly dressing kits.   Pt/caregiver deny any alarms or VAD equipment issues. Pt is completing weekly and monthly maintenance for LVAD equipment. Reviewed calibrating batteries including recognizing prompt, steps to complete, and rationale for doing so. Pt verbalized understanding of same.   VAD coordinator reviewed daily log from home for daily temperature, weight, and VAD parameters.    LVAD equipment check completed and is in good working order. Back-up equipment present. LVAD education done on emergency procedures and precautions and reviewed exit site care.   Currently listed at [facility] since 05/2013 and to dual list him at [facility]. Dr. Gala Romney in to see Mr. Portwood today--fluid status increased today with JVP 8-9; Optivol completed showing elevated fluid without ventricular dysrhythmias, no peripheral swelling noted. He advised to take 20 mg lasix PO today with 20 mEq potassium and to take another dose tomorrow if weight is not down at least 2 lbs. Followed up with [facility] last week and reported an unremarkable visit. Primary concern is the dizziness he continues to experience when he bends over. Discussed that with the addition of lasix today and possibly tomorrow to be more cautious with position changes and bending over since he has not taken lasix in some time.

## 2014-04-17 NOTE — Patient Instructions (Addendum)
1. Take a of 20 mg lasix and potassium today 04/17/14. Check your weight tomorrow if you are down 2-3 lbs do not take a second dose of lasix tomorrow 04/18/14.  2. If your weight is not down at least 2 lbs by tomorrow take a second dose of 20 mg lasix and potassium 04/18/14 also.  3. Call the clinic Friday to let us know how you are doing.  4. Follow up in 1 month.  5. We will work on sending information to [facility] to list you at a second center.    Do the following things EVERYDAY: 1. Weigh yourself in the morning before breakfast. Write it down and keep it in a log. 2. Take your medicines as prescribed 3. Eat low salt foods-Limit salt (sodium) to 2000 mg per day.  4. Stay as active as you can everyday 5. Limit all fluids for the day to less than 2 liters

## 2014-04-17 NOTE — Progress Notes (Signed)
CSW met with patient today in the VAD clinic. Patient reports that he received assistance with [employer] bill although still in need of assistance for propane tank to heat his home. He states that he has about a week left of propane. CSW will explore resources for assistance with propane. Raquel Sarna, [place]

## 2014-04-19 ENCOUNTER — Telehealth (HOSPITAL_COMMUNITY): Payer: Self-pay | Admitting: Infectious Diseases

## 2014-04-19 NOTE — Telephone Encounter (Signed)
Called in the clinic by Robert [name] today re: F/U after taking Lasix x 2 days as instructed from previous visit ib 04/17/14. Stated that he is only down maybe 1 lb overall, however he does feel better compared to during this week. He stated he planned on taking one more lasix/KCl combination since his weight loss was not what he expected. Denied any VAD alarms since taking the lasix. Stated he will continue to weigh himself everyday and call next week with an update. Instructed that if he saw >3 lb increase over a 24h period or >5 lbs over a week to please call the clinic or page the VAD pager if after hours immediately for further instructions. Verbalized he understood.

## 2014-04-22 ENCOUNTER — Telehealth: Payer: Self-pay | Admitting: Licensed Clinical Social Worker

## 2014-04-22 ENCOUNTER — Telehealth (HOSPITAL_COMMUNITY): Payer: Self-pay | Admitting: Infectious Diseases

## 2014-04-22 ENCOUNTER — Other Ambulatory Visit (HOSPITAL_COMMUNITY): Payer: Self-pay | Admitting: Infectious Diseases

## 2014-04-22 DIAGNOSIS — Z95811 Presence of heart assist device: Secondary | ICD-10-CM

## 2014-04-22 DIAGNOSIS — R0602 Shortness of breath: Secondary | ICD-10-CM

## 2014-04-22 DIAGNOSIS — Z7901 Long term (current) use of anticoagulants: Secondary | ICD-10-CM

## 2014-04-22 NOTE — Telephone Encounter (Signed)
Discussed the event with Dr. Gala Romney and Tonye Becket, NP and we all feel that Robert [name] should come to the clinic tomorrow for further evaluation re: his lack of energy and increased need to rest/sleep throughout the day. Scheduled visit at 0900 04/23/14.

## 2014-04-22 NOTE — Telephone Encounter (Signed)
Called patient to f/u re: last week's clinic visit and need for PRN lasix dosing. Robert [name] reports that he is feeling "O.K." but still c/o dizziness and states he has "no energy and sleeps all the time." Last recorded weight was Sunday 04/21/14 of 242 lbs (2 lb loss since diuretic) and denies any alarms since we last spoke Friday. Current VAD parameters read to me were Speed 9800 rpm, Flow 5.7, PI 6.3, Power 6.5. He c/o shortness of breath/tightness in his chest whenever he performs activity and finds that he needs to rest more and more throughout the day especially with bending/stooping and lifting. Informed him that I would discuss the above with his providers and get back to him shortly if we would like to see him in clinic this week.

## 2014-04-22 NOTE — Telephone Encounter (Signed)
CSW contacted patient to confirm insurance. Patient reports he paid monthly premium thorough [employer] although due to open enrollment stated that they are behind in updating policies. Patient denies any concerns and states that his insurance is up to date. CSW continues to be available. [name], LCSW [phone]

## 2014-04-23 ENCOUNTER — Ambulatory Visit (HOSPITAL_COMMUNITY)
Admission: RE | Admit: 2014-04-23 | Discharge: 2014-04-23 | Disposition: A | Discharge status: Home / Self Care | Payer: BLUE CROSS/BLUE SHIELD | Source: Ambulatory Visit | Attending: Internal Medicine | Admitting: Internal Medicine

## 2014-04-23 ENCOUNTER — Ambulatory Visit (HOSPITAL_COMMUNITY): Payer: Self-pay | Admitting: *Deleted

## 2014-04-23 VITALS — BP 108/0 | HR 74 | Ht 74.0 in | Wt 245.6 lb

## 2014-04-23 DIAGNOSIS — R0602 Shortness of breath: Secondary | ICD-10-CM | POA: Diagnosis not present

## 2014-04-23 DIAGNOSIS — I5022 Chronic systolic (congestive) heart failure: Secondary | ICD-10-CM

## 2014-04-23 DIAGNOSIS — Z7901 Long term (current) use of anticoagulants: Secondary | ICD-10-CM | POA: Diagnosis not present

## 2014-04-23 DIAGNOSIS — Z95811 Presence of heart assist device: Secondary | ICD-10-CM

## 2014-04-23 DIAGNOSIS — I493 Ventricular premature depolarization: Secondary | ICD-10-CM

## 2014-04-23 LAB — COMPREHENSIVE METABOLIC PANEL
ALBUMIN: 4.1 g/dL (ref 3.5–5.2)
ALT: 15 U/L (ref 0–53)
ANION GAP: 6 (ref 5–15)
AST: 18 U/L (ref 0–37)
Alkaline Phosphatase: 83 U/L (ref 39–117)
BUN: 16 mg/dL (ref 6–23)
CALCIUM: 9.1 mg/dL (ref 8.4–10.5)
CHLORIDE: 106 meq/L (ref 96–112)
CO2: 28 mmol/L (ref 19–32)
CREATININE: 1.37 mg/dL — AB (ref 0.50–1.35)
GFR calc Af Amer: 64 mL/min — ABNORMAL LOW (ref >90)
GFR calc non Af Amer: 55 mL/min — ABNORMAL LOW (ref >90)
Glucose, Bld: 84 mg/dL (ref 70–99)
POTASSIUM: 3.9 mmol/L (ref 3.5–5.1)
Sodium: 140 mmol/L (ref 135–145)
TOTAL PROTEIN: 7.2 g/dL (ref 6.0–8.3)
Total Bilirubin: 0.9 mg/dL (ref 0.3–1.2)

## 2014-04-23 LAB — CBC
HCT: 42.4 % (ref 39.0–52.0)
HEMOGLOBIN: 14.4 g/dL (ref 13.0–17.0)
MCH: 29.4 pg (ref 26.0–34.0)
MCHC: 34 g/dL (ref 30.0–36.0)
MCV: 86.5 fL (ref 78.0–100.0)
PLATELETS: 189 10*3/uL (ref 150–400)
RBC: 4.9 MIL/uL (ref 4.22–5.81)
RDW: 14 % (ref 11.5–15.5)
WBC: 7 10*3/uL (ref 4.0–10.5)

## 2014-04-23 LAB — PROTIME-INR
INR: 2.18 — AB (ref 0.00–1.49)
Prothrombin Time: 24.5 seconds — ABNORMAL HIGH (ref 11.6–15.2)

## 2014-04-23 LAB — LACTATE DEHYDROGENASE: LDH: 203 U/L (ref 94–250)

## 2014-04-23 LAB — BRAIN NATRIURETIC PEPTIDE: B Natriuretic Peptide: 36.5 pg/mL (ref 0.0–100.0)

## 2014-04-23 NOTE — Patient Instructions (Signed)
1.   Take coumadin 7.5 mg today then resume normal dose. Re-check INR at next clinic visit. 2.   Call if dizzy symptoms do not improve or worsen. 3.   Return to VAD clinic on 05/15/14.

## 2014-04-23 NOTE — Progress Notes (Signed)
Symptom  Yes  No  Details   Angina       x Activity:   Claudication       x How far:   Syncope       x When:  dizziness with orthostatic changes (especially bending over) - worse over last week  Stroke       x   Orthopnea       x How many pillows:  1 pillow  PND       x How often:  CPAP       N/A How many hrs:   Pedal edema       x   Abd fullness       x   N&V       x  good appetite; eat small meals   Diaphoresis       x When:  Bleeding      x   Urine color    light yellow  SOB       x Activity:   Palpitations        x      When: weekly  ICD shock       x   Hospitlizaitons       x When/where/why:  ED visit       x When/where/why:  Other MD            x When/who/why:    Activity          Fluid      No limitations  Diet      No limitations   Vital signs: HR:  74 MAP BP:  108;  107/89 (95) O2 Sat:  98 Wt:  245.6 lbs Last wt:  248.2  lbs Ht:  6'2"  LVAD interrogation reveals:  Speed:  9800 Flow:   6.3 Power:  6.6 PI:  5.7 Alarms:  none Events:  5 - 15 PI events.    1/16: 31 PI;  1/15:  30 PI Fixed speed:  9800 Low speed limit:  9200  11V battery status:  Primary controller: replace 18 months (January 2017) Secondary controller:  Replace 18 months (January 2017)   LVAD exit site:  Well healed and incorporated. The velour is fully implanted at exit site. Dressing dry and intact. No erythema or drainage. Stabilization device present and accurately applied. Driveline dressing is being changed q 3 - 5 days per sterile technique using Sorbaview dressing with biopatch on exit site. Pt denies fever or chills. Pt has adequate dressing supplies.   Pt/caregiver deny any alarms or VAD equipment issues. Pt is completing weekly and monthly maintenance for LVAD equipment. LVAD equipment check completed and is in good working order. Back-up equipment present. LVAD education done on emergency procedures and precautions and reviewed exit site care.  Pt taking Hydralazine 100 mg twice  daily (cannot remember mid day dose).  Pt c/o increased palpitations and dizziness with exertion, especially when bending over and lifting objects.  Pt is worried about getting "shock" from his defibrillator.   [employer] rep performed full device interrogation with no arrhythmias noted. Asked patient to bend/lift and reproduced the above mentioned symptoms; ICD electrogram revealed HR increase from paced 70 bpm to intrinsic rate 80's.  Device parameters:  AAIR <=> DDR (mode switch 171 bpm); lower rate 70 bpm.  Therapy:  VT 162-200 bpm;  VF > 200 bpm. Explained that pt is feeling his own heart beat, and not ventricular arrhythmias at this time.  Dr. Gala Romney decreased LVAD speed to 9600 RPMs to decrease above symptoms possibly caused by LVAD inflow cannula position.  Explained to patient he needs to stay hydrated and may need to have a little extra fluid on board to prevent suction events.   Primary and backup controller re-programmed to fixed speed 9600 with low speed limit 9000 RPM.  Asked pt to call VAD clinic/pager if symptoms do not improve or worsen.

## 2014-04-29 NOTE — Progress Notes (Signed)
Patient ID: Robert [name], male   DOB: [birth year], 59 y.o.   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 status post dual chamber Medtronic ICD implanted April 2011. Cath 2011 showed normal coronaries. He also has a history of VTach. Currently listed as 1b list for heart tx at [facility].   Follow-up: He returns for an unscheduled visit. Says he feels worse ever since his VAD speed was increased. Has increased palpitations and dizziness with exertion, especially when bending over and lifting objects. Chest feels full. No syncope or edema. Weight stable. Also depressed about ability to not work and having to struggle with bills.   We interrogated his ICD in clinic personally with help of MDT rep: no arrhythmias noted. Asked patient to bend/lift and reproduced the above mentioned symptoms; ICD electrogram revealed HR increase from paced 70 bpm to intrinsic rate 80's. Device parameters: AAIR <=> DDR (mode switch 171 bpm); lower rate 70 bpm. Therapy: VT 162-200 bpm; VF > 200 bpm.    Denies LVAD alarms.  Denies driveline trauma, erythema or drainage.  Denies ICD shocks.Reports taking Coumadin as prescribed and adherence to anticoagulation based dietary restrictions.  Denies bright red blood per rectum or melena, no dark urine or hematuria.     Past Medical History  Diagnosis Date  . AICD (automatic cardioverter/defibrillator) present   . Hypertension   . Congestive heart failure   . Pneumonia   . Bronchitis   . Pulmonary hypertension     Current Outpatient Prescriptions  Medication Sig Dispense Refill  . allopurinol (ZYLOPRIM) 300 MG tablet Take 1 tablet (300 mg total) by mouth daily. 90 tablet 3  . amiodarone (PACERONE) 200 MG tablet Take 1 tablet (200 mg total) by mouth daily. 90 tablet 3  . amLODipine (NORVASC) 10 MG tablet TAKE ONE (1) TABLET  EACH DAY 30 tablet 6  . aspirin EC 325 MG tablet Take 1 tablet (325 mg total) by mouth daily. 30 tablet 6  . doxazosin (CARDURA) 1 MG tablet Take 1 tablet (1 mg total) by mouth daily. 90 tablet 3  . hydrALAZINE (APRESOLINE) 100 MG tablet Take 1 tablet (100 mg total) by mouth 3 (three) times daily. (Patient taking differently: Take 100 mg by mouth 2 (two) times daily. ) 120 tablet 6  . lisinopril (PRINIVIL,ZESTRIL) 20 MG tablet Take 1-2 tablets (20-40 mg total) by mouth daily. Take 2 tablets ( )in the morning, and 1 tablet ( ) in the evening. 90 tablet 6  . pantoprazole (PROTONIX) 40 MG tablet Take 1 tablet (40 mg total) by mouth daily. 30 tablet 6  . warfarin (COUMADIN) 5 MG tablet Take 5-7.5 mg by mouth daily. Take as directed for INR goal 2.0 - 3.0. Take 1 tablet ( ) all days except Friday take 1 1/2 tablet (7.5mg )     No current facility-administered medications for this encounter.    Imdur and Metoprolol  REVIEW OF SYSTEMS: All systems negative except as listed in HPI, PMH and Problem list.   LVAD interrogation reveals:  Speed: 9800 Flow: 6.3 Power: 6.6 PI: 5.7 Alarms: none Events: 5 - 15 PI events. 1/16: 31 PI; 1/15: 30 PI Fixed speed: 9800 Low speed limit: 9200  11V battery status:  Primary controller: replace 18 months (January 2017) Secondary controller: Replace 18 months (January 2017)  I reviewed the LVAD parameters  from today, and compared the results to the patient's prior recorded data.  No programming changes were made.  The LVAD is functioning within specified parameters.  The patient performs LVAD self-test daily.  LVAD interrogation was negative for any significant power changes, alarms or PI events/speed drops.  LVAD equipment check completed and is in good working order.  Back-up equipment present.   LVAD education done on emergency procedures and precautions and reviewed exit site care.    Filed Vitals:   04/23/14 0924  BP: 108/0  Pulse:  74  Height: 6\' 2"  (1.88 m)  Weight: 245 lb 9.6 oz (111.403 kg)  SpO2: 98%   MAP 95 Physical Exam: GENERAL: Well appearing, male who presents to clinic today in no acute distress. HEENT: normal  NECK: Supple, JVP flat;  2+ bilaterally, no bruits.  No lymphadenopathy or thyromegaly appreciated.   CARDIAC:  Mechanical heart sounds with LVAD hum present.  LUNGS:  Clear to auscultation bilaterally.  ABDOMEN:  Soft, round, nontender, positive bowel sounds x4.     LVAD exit site: well-healed and incorporated.  Dressing dry and intact.  No erythema or drainage.  Stabilization device present and accurately applied.  Driveline dressing is being changed daily per sterile technique. EXTREMITIES:  Warm and dry, no cyanosis, clubbing, rash or edema  NEUROLOGIC:  Alert and oriented x 4.  Gait steady.  No aphasia.  No dysarthria.  Affect pleasant.      ASSESSMENT AND PLAN:   1) Chronic systolic HF, s/p HMII LVAD implant 05/2012. Currently 1B at [facility].  - NYHA II symptoms and volume status stable. - Struggles with palpitations and dizziness. ICD interrogated personally and no arrhythmias. CXR reviewed and shows LV is significantly smaller than during implant. Suspect symptoms possibly caused by LVAD inflow cannula position and suction events. VAD speed decreased to 9600/ Explained to patient he needs to stay hydrated and may need to have a little extra fluid on board to prevent suction events.  - He is not on a BB. He was able to tolerate Toprol before LVAD, however while in the hospital with implant over a year ago had syncope on higher dose.  - Continue lisinopril, norvasc, doxazosin, and hydralazine at current doses.  Intolerant of Imdur d/t headaches.  - MAP 95    - Reinforced the need and importance of daily weights, a low sodium diet, and fluid restriction (less than 2 L a day). Instructed to call the HF clinic if weight increases more than 3 lbs overnight or 5 lbs in a week.  2) NSVT/VT  - ICD  interrogated - no afib or NSVT. Continue amiodarone 200 mg daily will not stop with history of NSVT 3) HTN-  - MAP 95.  Continue current medications.   4) Anticoagulation with coumadin  - Continue coumadin and ASA 325 mg for LVAD. Check INR and adjust coumadin accordingly per HF pharmacy.   5) LVAD  - Speed decreased to 9600. Check labs. Remains on 1B list at [facility] and just seen last week by their team. Plan to dual list at [facility].    Arvilla Meres MD  10:29 AM

## 2014-05-06 ENCOUNTER — Telehealth: Payer: Self-pay | Admitting: Cardiology

## 2014-05-06 ENCOUNTER — Ambulatory Visit (INDEPENDENT_AMBULATORY_CARE_PROVIDER_SITE_OTHER): Payer: BLUE CROSS/BLUE SHIELD | Admitting: *Deleted

## 2014-05-06 DIAGNOSIS — I472 Ventricular tachycardia, unspecified: Secondary | ICD-10-CM

## 2014-05-06 DIAGNOSIS — I5022 Chronic systolic (congestive) heart failure: Secondary | ICD-10-CM

## 2014-05-06 LAB — MDC_IDC_ENUM_SESS_TYPE_REMOTE
Brady Statistic AP VP Percent: 0.1 % — CL
Brady Statistic AP VS Percent: 95.3 %
Brady Statistic AS VS Percent: 4.6 %
Lead Channel Impedance Value: 437 Ohm
Lead Channel Impedance Value: 475 Ohm
Lead Channel Pacing Threshold Amplitude: 1 V
Lead Channel Pacing Threshold Pulse Width: 0.4 ms
Lead Channel Pacing Threshold Pulse Width: 0.4 ms
Lead Channel Sensing Intrinsic Amplitude: 8.8 mV
Lead Channel Setting Pacing Amplitude: 1.5 V
Lead Channel Setting Pacing Amplitude: 2.25 V
Lead Channel Setting Pacing Pulse Width: 0.4 ms
Lead Channel Setting Sensing Sensitivity: 0.3 mV
MDC IDC MSMT BATTERY VOLTAGE: 2.79 V
MDC IDC MSMT LEADCHNL RA PACING THRESHOLD AMPLITUDE: 0.625 V
MDC IDC MSMT LEADCHNL RA SENSING INTR AMPL: 1.6 mV
MDC IDC SET ZONE DETECTION INTERVAL: 350.8 ms
MDC IDC SET ZONE DETECTION INTERVAL: 370.3 ms
MDC IDC STAT BRADY AS VP PERCENT: 0.1 % — AB
Zone Setting Detection Interval: 300 ms
Zone Setting Detection Interval: 350.8 ms

## 2014-05-06 NOTE — Telephone Encounter (Signed)
LMOVM reminding pt to send remote transmission.   

## 2014-05-07 NOTE — Progress Notes (Signed)
Remote ICD transmission.   

## 2014-05-08 ENCOUNTER — Telehealth (HOSPITAL_COMMUNITY): Payer: Self-pay | Admitting: *Deleted

## 2014-05-08 ENCOUNTER — Other Ambulatory Visit: Payer: Self-pay

## 2014-05-08 NOTE — Telephone Encounter (Signed)
Called pt re: missed INR appt today; pt will go tomorrow. Pt confirmed he went to [facility] last week for heart transplant evaluation consult. [facility] will be reaching out to [facility] for records and consider dual listing Mr. Favorito when their evaluation is complete.

## 2014-05-09 ENCOUNTER — Ambulatory Visit (HOSPITAL_COMMUNITY): Payer: Self-pay | Admitting: *Deleted

## 2014-05-09 ENCOUNTER — Other Ambulatory Visit (INDEPENDENT_AMBULATORY_CARE_PROVIDER_SITE_OTHER): Payer: BLUE CROSS/BLUE SHIELD | Admitting: *Deleted

## 2014-05-09 ENCOUNTER — Telehealth (HOSPITAL_COMMUNITY): Payer: Self-pay | Admitting: Vascular Surgery

## 2014-05-09 DIAGNOSIS — Z7901 Long term (current) use of anticoagulants: Secondary | ICD-10-CM

## 2014-05-09 DIAGNOSIS — Z95811 Presence of heart assist device: Secondary | ICD-10-CM

## 2014-05-09 LAB — PROTIME-INR
INR: 2.8 ratio — ABNORMAL HIGH (ref 0.8–1.0)
Prothrombin Time: 29.8 s — ABNORMAL HIGH (ref 9.6–13.1)

## 2014-05-09 MED ORDER — AMLODIPINE BESYLATE 10 MG PO TABS: ORAL_TABLET | ORAL | Status: DC

## 2014-05-09 NOTE — Telephone Encounter (Signed)
Refill Amlodipine 90 day supply, pt needs today he would like a call when its called in .Marland Kitchen Please advise

## 2014-05-14 ENCOUNTER — Other Ambulatory Visit (HOSPITAL_COMMUNITY): Payer: Self-pay | Admitting: Infectious Diseases

## 2014-05-14 DIAGNOSIS — Z95811 Presence of heart assist device: Secondary | ICD-10-CM

## 2014-05-14 DIAGNOSIS — Z7901 Long term (current) use of anticoagulants: Secondary | ICD-10-CM

## 2014-05-15 ENCOUNTER — Other Ambulatory Visit (HOSPITAL_COMMUNITY): Payer: Self-pay | Admitting: *Deleted

## 2014-05-15 ENCOUNTER — Ambulatory Visit (HOSPITAL_COMMUNITY)
Admission: RE | Admit: 2014-05-15 | Discharge: 2014-05-15 | Disposition: A | Discharge status: Home / Self Care | Payer: BLUE CROSS/BLUE SHIELD | Source: Ambulatory Visit | Attending: Adult Health | Admitting: Adult Health

## 2014-05-15 ENCOUNTER — Encounter: Payer: Self-pay | Admitting: Licensed Clinical Social Worker

## 2014-05-15 ENCOUNTER — Ambulatory Visit (HOSPITAL_COMMUNITY): Payer: Self-pay | Admitting: *Deleted

## 2014-05-15 ENCOUNTER — Telehealth (HOSPITAL_COMMUNITY): Payer: Self-pay | Admitting: *Deleted

## 2014-05-15 VITALS — BP 100/0 | HR 75 | Ht 74.0 in | Wt 247.2 lb

## 2014-05-15 DIAGNOSIS — I472 Ventricular tachycardia, unspecified: Secondary | ICD-10-CM

## 2014-05-15 DIAGNOSIS — Z79899 Other long term (current) drug therapy: Secondary | ICD-10-CM

## 2014-05-15 DIAGNOSIS — Z7901 Long term (current) use of anticoagulants: Secondary | ICD-10-CM

## 2014-05-15 DIAGNOSIS — I5022 Chronic systolic (congestive) heart failure: Secondary | ICD-10-CM

## 2014-05-15 DIAGNOSIS — R0600 Dyspnea, unspecified: Secondary | ICD-10-CM

## 2014-05-15 DIAGNOSIS — I10 Essential (primary) hypertension: Secondary | ICD-10-CM | POA: Diagnosis not present

## 2014-05-15 DIAGNOSIS — Z95811 Presence of heart assist device: Secondary | ICD-10-CM

## 2014-05-15 DIAGNOSIS — I420 Dilated cardiomyopathy: Secondary | ICD-10-CM

## 2014-05-15 DIAGNOSIS — I5023 Acute on chronic systolic (congestive) heart failure: Secondary | ICD-10-CM

## 2014-05-15 DIAGNOSIS — I428 Other cardiomyopathies: Secondary | ICD-10-CM

## 2014-05-15 DIAGNOSIS — E7800 Pure hypercholesterolemia, unspecified: Secondary | ICD-10-CM

## 2014-05-15 DIAGNOSIS — I4729 Other ventricular tachycardia: Secondary | ICD-10-CM

## 2014-05-15 LAB — LIPID PANEL
CHOLESTEROL: 260 mg/dL — AB (ref 0–200)
HDL: 70 mg/dL (ref >39)
LDL CALC: 165 mg/dL — AB (ref 0–99)
TRIGLYCERIDES: 127 mg/dL (ref <150)
Total CHOL/HDL Ratio: 3.7 RATIO
VLDL: 25 mg/dL (ref 0–40)

## 2014-05-15 LAB — BASIC METABOLIC PANEL
Anion gap: 7 (ref 5–15)
BUN: 10 mg/dL (ref 6–23)
CO2: 27 mmol/L (ref 19–32)
Calcium: 8.9 mg/dL (ref 8.4–10.5)
Chloride: 107 mmol/L (ref 96–112)
Creatinine, Ser: 1.27 mg/dL (ref 0.50–1.35)
GFR calc Af Amer: 70 mL/min — ABNORMAL LOW (ref >90)
GFR calc non Af Amer: 61 mL/min — ABNORMAL LOW (ref >90)
Glucose, Bld: 89 mg/dL (ref 70–99)
POTASSIUM: 3.8 mmol/L (ref 3.5–5.1)
Sodium: 141 mmol/L (ref 135–145)

## 2014-05-15 LAB — CBC
HCT: 42.9 % (ref 39.0–52.0)
HEMOGLOBIN: 14.3 g/dL (ref 13.0–17.0)
MCH: 29.3 pg (ref 26.0–34.0)
MCHC: 33.3 g/dL (ref 30.0–36.0)
MCV: 87.9 fL (ref 78.0–100.0)
Platelets: 151 10*3/uL (ref 150–400)
RBC: 4.88 MIL/uL (ref 4.22–5.81)
RDW: 14.5 % (ref 11.5–15.5)
WBC: 5.8 10*3/uL (ref 4.0–10.5)

## 2014-05-15 LAB — PROTIME-INR
INR: 2.42 — ABNORMAL HIGH (ref 0.00–1.49)
Prothrombin Time: 26.5 seconds — ABNORMAL HIGH (ref 11.6–15.2)

## 2014-05-15 LAB — PREALBUMIN: PREALBUMIN: 28.4 mg/dL (ref 17.0–34.0)

## 2014-05-15 LAB — LACTATE DEHYDROGENASE: LDH: 211 U/L (ref 94–250)

## 2014-05-15 LAB — BRAIN NATRIURETIC PEPTIDE: B NATRIURETIC PEPTIDE 5: 74.1 pg/mL (ref 0.0–100.0)

## 2014-05-15 LAB — T4: T4, Total: 8.4 ug/dL (ref 4.5–12.0)

## 2014-05-15 LAB — TSH: TSH: 2.219 u[IU]/mL (ref 0.350–4.500)

## 2014-05-15 MED ORDER — ATORVASTATIN CALCIUM 20 MG PO TABS: 20.0000 mg | ORAL_TABLET | Freq: Every day | ORAL | Status: DC

## 2014-05-15 MED ORDER — DOXAZOSIN MESYLATE 1 MG PO TABS: 1.0000 mg | ORAL_TABLET | Freq: Every day | ORAL | Status: DC

## 2014-05-15 MED ORDER — DOXAZOSIN MESYLATE 2 MG PO TABS: 2.0000 mg | ORAL_TABLET | Freq: Every day | ORAL | Status: DC

## 2014-05-15 MED ORDER — AMLODIPINE BESYLATE 10 MG PO TABS: 10.0000 mg | ORAL_TABLET | Freq: Every day | ORAL | Status: DC

## 2014-05-15 MED ORDER — AMIODARONE HCL 200 MG PO TABS: 200.0000 mg | ORAL_TABLET | Freq: Every day | ORAL | Status: DC

## 2014-05-15 MED ORDER — LISINOPRIL 20 MG PO TABS
ORAL_TABLET | ORAL | Status: DC
Start: 2014-05-15 — End: 2015-03-04

## 2014-05-15 MED ORDER — HYDRALAZINE HCL 100 MG PO TABS: 100.0000 mg | ORAL_TABLET | Freq: Two times a day (BID) | ORAL | Status: DC

## 2014-05-15 NOTE — Patient Instructions (Signed)
1.  Increase your Cardura to 2 mg daily (take one whole pill) daily. 2.  No change in coumadin dose.  Re-check INR at [facility] on 06/04/14. 3.  Will call you with PFT testing date. 4.  Get annual eye exam. 5.  Return to VAD clinic in one month.

## 2014-05-15 NOTE — Progress Notes (Addendum)
Symptom  Yes  No  Details   Angina       x Activity:   Claudication       x How far:   Syncope       x When:  dizziness with orthostatic changes (especially bending over) - improved since speed dropped last visit  Stroke       x   Orthopnea       x How many pillows:  1 pillow  PND       x How often:  CPAP       N/A How many hrs:   Pedal edema       x   Abd fullness       x   N&V       x  good appetite; eat small meals   Diaphoresis       x When:  Bleeding      x   Urine color    light yellow  SOB       x Activity:   Palpitations        x      When: daily  ICD shock       x   Hospitlizaitons       x When/where/why:  ED visit       x When/where/why:  Other MD            x When/who/why:    Activity        improved; performing daily activities  Fluid      No limitations  Diet      No limitations   Vital signs: HR:  75 MAP BP:  100 O2 Sat:  99 Wt:  247.2  lbs Last wt:  245.6  lbs Ht:  6'2"  LVAD interrogation reveals:  Speed:  9600 Flow:   4.7 Power:  5.8 PI:  7.0 Alarms:  none Events:  0 - 5 PI events Fixed speed:  9600 Low speed limit:  9000  11V battery status:  Primary controller: replace 17 months (January 2017) Secondary controller:  Replace 17 months (January 2017)   LVAD exit site:  Well healed and incorporated. The velour is fully implanted at exit site. Dressing dry and intact. No erythema or drainage. Stabilization device present and accurately applied. Driveline dressing is being changed q 3 - 5 days per sterile technique using Sorbaview dressing with biopatch on exit site. Pt does self dressing changes. Pt denies fever or chills. Pt provided with adequate dressing supplies for home use.   Driveline has multiple tears on outer protective covering with tape repairs. Suggested alternative ways of carrying controller (currently carries on inside pocket of shorts with jeans over shorts). Stressed importance of protecting driveline from bends, twisting, and  kinking. Pt verbalized understanding of same.  Pt denies any alarms or VAD equipment issues. Pt is completing weekly and monthly maintenance for LVAD equipment. LVAD equipment check completed and is in good working order. Back-up equipment present. LVAD education done on emergency procedures and precautions and reviewed exit site care.   Reviewed and demonstrated the following to patient and caregiver:               Reviewed the steps for replacing the running system controller with the back-up system controller (see patient handbook section 2 or the appropriate pamphlet).             X  Demonstrated (using the mock-driveline and controller) how to connect and  disconnect the mock-driveline in back up system controller in a timely manner (less than 10 seconds) with return demonstration by patient and caregiver.              X   Reviewed system controller alarms and troubleshooting including hazard and advisory alarms and accessing alarm history. Re-enforced NOT TO attempt to perform any task displayed on the display screen alone or without calling the VAD pager [phone] (see patient handbook section 5 or the appropriate pamphlet).                       X  Reviewed how to handle an emergency including when the pump is running and when the pump has stopped (see patient handbook section 8). Call 911 first and then the VAD pager at [phone].             X   Reviewed 14-volt lithium ion battery calibration steps (see patient handbook section 3).            X   Reviewed contents of black bag and what must be with patient at all times.            X  Reviewed driveline exit site including cleansing, dressing, and immobilizing with an anchor device to prevent exit site trauma.             X  Reviewed weekly maintenance which includes: Reviewing "Replacing the Running System Controller with a [employer]" pamphlet. Clean batteries, clips, and battery charger contacts.  Check cables  for damages. Rotate batteries; keep all 8 charged.              X  Reviewed monthly maintenance which includes: Reviewing Alarms and Troubleshooting. Check battery manufacturer dates. Check use/charge cycles for each battery; remember to re-calibrate every 70 uses when prompted.              X  Additional pamphlets Guide to Replacing the Running System Controller with the Backup Controller for Patients and Their Caregivers and HM II Alarms for Patients and Their Caregivers given to patient.              X   Batteries Manufacture Date:  Number of uses: Re-calibration   05/2012 158 - 266 Performed by patient   Annual maintenance completed per [employer] on patient's home power module and universal [employer] features editor.    Backup system controller 11 volt battery charged during visit.   2 year Intermacs follow up completed including:  Quality of Life, KCCQ-12, and Neurocognitive trail making.   Pt completed 6 min walk without symptoms of SOB, palpitations, or fatigue. Pt walked 1420 feet which was an improvement from 1.5 year 6 min walk where he completed 1050 feet.   Hessie Diener, RN   Agree  Daniel Bensimhon,MD 1:20 PM

## 2014-05-15 NOTE — Telephone Encounter (Signed)
Called patient per Tonye Becket, NP - instructed patient to resume fish oil daily. Pt verbalized understanding of same.

## 2014-05-15 NOTE — Addendum Note (Signed)
Encounter addended by: Hessie Diener, RN on: 05/15/2014  2:14 PM<BR>     Documentation filed: Notes Section

## 2014-05-15 NOTE — Progress Notes (Addendum)
Patient ID: [name], male   DOB: April 10, 1955, 59 y.o.   MRN: [id number] PCP: N/A  HPI: Mr. 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 status post dual chamber Medtronic ICD implanted April 2011. Cath 2011 showed normal coronaries. He also has a history of VTach. Currently listed as 1b list for heart tx at [facility].   Follow-up: At last visit VAD speed decreased to 9600. Feels much better. Much less dizziness. Denies dyspnea, SOB, edema. Seen recently at [facility] for dual listing and had good visit. They indicated that they would consider using his 1A time at some point. Cardura started at  daily.   Denies LVAD alarms.  Denies driveline trauma, erythema or drainage.  Denies ICD shocks.Reports taking Coumadin as prescribed and adherence to anticoagulation based dietary restrictions.  Denies bright red blood per rectum or melena, no dark urine or hematuria.     Past Medical History  Diagnosis Date  . AICD (automatic cardioverter/defibrillator) present   . Hypertension   . Congestive heart failure   . Pneumonia   . Bronchitis   . Pulmonary hypertension     Current Outpatient Prescriptions  Medication Sig Dispense Refill  . allopurinol (ZYLOPRIM) 300 MG tablet Take 1 tablet (300 mg total) by mouth daily. 90 tablet 3  . amiodarone (PACERONE) 200 MG tablet Take 1 tablet (200 mg total) by mouth daily. 90 tablet 3  . amLODipine (NORVASC) 10 MG tablet Take 1 tablet (10 mg total) by mouth daily. 90 tablet 3  . aspirin EC 325 MG tablet Take 1 tablet (325 mg total) by mouth daily. 30 tablet 6  . doxazosin (CARDURA) 2 MG tablet Take 1 tablet (2 mg total) by mouth daily. 30 tablet 6  . hydrALAZINE (APRESOLINE) 100 MG tablet Take 1 tablet (100 mg total) by mouth 2 (two) times daily. 180 tablet 3  . lisinopril (PRINIVIL,ZESTRIL) 20 MG tablet Take 2 tablets ( )in the morning, and 1  tablet ( ) in the evening. 270 tablet 3  . pantoprazole (PROTONIX) 40 MG tablet Take 1 tablet (40 mg total) by mouth daily. 30 tablet 6  . warfarin (COUMADIN) 5 MG tablet Take 5-7.5 mg by mouth daily. Take as directed for INR goal 2.0 - 3.0. Take 1 tablet ( ) all days except Friday take 1 1/2 tablet (7.5mg )     No current facility-administered medications for this encounter.    Imdur and Metoprolol  REVIEW OF SYSTEMS: All systems negative except as listed in HPI, PMH and Problem list.   LVAD interrogation reveals:  Speed: 9600 Flow: 4.7 Power: 5.8 PI: 7.0 Alarms: none Events: 0 - 5 PI events Fixed speed: 9600 Low speed limit: 9000  11V battery status:  Primary controller: replace 18 months (January 2017) Secondary controller: Replace 18 months (January 2017)  I reviewed the LVAD parameters from today, and compared the results to the patient's prior recorded data.  No programming changes were made.  The LVAD is functioning within specified parameters.  The patient performs LVAD self-test daily.  LVAD interrogation was negative for any significant power changes, alarms or PI events/speed drops.  LVAD equipment check completed and is in good working order.  Back-up equipment present.   LVAD education done on emergency procedures and precautions and reviewed exit site care.    Filed Vitals:   05/15/14 1002  BP: 100/0  Pulse: 75  Height: 6\' 2"  (1.88 m)  Weight: 247 lb 3.2 oz (112.129 kg)  SpO2: 99%   MAP 110 Physical Exam: GENERAL: Well appearing, male who presents to clinic today in no acute distress. HEENT: normal  NECK: Supple, JVP flat;  2+ bilaterally, no bruits.  No lymphadenopathy or thyromegaly appreciated.   CARDIAC:  Mechanical heart sounds with LVAD hum present.  LUNGS:  Clear to auscultation bilaterally.  ABDOMEN:  Soft, round, nontender, positive bowel sounds x4.     LVAD exit site: well-healed and incorporated.  Dressing dry and intact.  No  erythema or drainage.  Stabilization device present and accurately applied.  Driveline dressing is being changed daily per sterile technique. EXTREMITIES:  Warm and dry, no cyanosis, clubbing, rash or edema  NEUROLOGIC:  Alert and oriented x 4.  Gait steady.  No aphasia.  No dysarthria.  Affect pleasant.      ASSESSMENT AND PLAN:   1) Chronic systolic HF, s/p HMII LVAD implant 05/2012. Currently 1B at [facility].  - He is improved with VAD speed down to 9600 NYHA II symptoms and volume status stable. - Now being dual listed at [facility] - He is not on a BB. He was able to tolerate Toprol before LVAD, however while in the hospital with implant over a year ago had syncope on higher dose.  - Continue lisinopril, norvasc, doxazosin, and hydralazine at current doses.  Intolerant of Imdur d/t headaches.  - Reinforced the need and importance of daily weights, a low sodium diet, and fluid restriction (less than 2 L a day). Instructed to call the HF clinic if weight increases more than 3 lbs overnight or 5 lbs in a week.  2) NSVT/VT  - Continue amiodarone 200 mg daily will not stop with history of NSVT 3) HTN-  - MAP 110.  Increase cardura to 2mg  daily   4) Anticoagulation with coumadin  - Continue coumadin and ASA 325 mg for LVAD. Check INR and adjust coumadin accordingly per HF pharmacy.   5) LVAD  - Improved on 9600. Continue transplant process.    Arvilla Meres MD  1:14 PM   Addendum:  Patient will need RHC as part of transplant w/u. Will be scheduled for 06/10/14.  Anjani Feuerborn,MD 9:49 AM

## 2014-05-15 NOTE — Telephone Encounter (Signed)
Called pt per Tonye Becket, NP - asked pt to not re-start Fish Oil, but to start Lipitor 20 mg daily. Asked pt to call back and confirm he received message.

## 2014-05-20 ENCOUNTER — Encounter: Payer: Self-pay | Admitting: Cardiology

## 2014-05-20 NOTE — Progress Notes (Signed)
CSW met with patient for his 59 year annual reassessment. Patient denies any changes in demographics. Patient states he is compliant with his medical appointments and follow up. He denies any concerns with medications or compliance. Patient states that he does not use alcohol, tobacco or illegal substances. He denies any changes to his medical history since receiving the VAD.   Patient reports he has a Living Will and his son Sherrel Shafer is his HPOA. He reports that he has discussed goals of care with his entire family. He currently lives alone but his 15yo son visits daily with him after school. He noted his 2 sons and mother as support persons in his life. His mother would be the primary caregiver if needed but at the moment he is independent. He reports that he sometimes gets dizzy when bending over and that is a barrier at times to his care. He reports that he is currently doing the driveline dressing changes and uses the mirror to complete. He denies any concerns. He is involved with his [employer] and states he enjoys watching basketball games.  He was recently approved for SSI and receives $733 monthly. He hopes to return to work after receiving his transplant. He admits to having challenges with paying his monthly bills and will budget as best he can. He owns his own home and plans to refinance in order to reduce monthly mortgage payment.His primary insurance is BC/BS but should also obtain medicaid shortly with his SSI benefits.   He admits to depression over the past year mostly related to the stress of financial burdens. He remains positive and hopeful. He states that he often has concerns with body image as "you always need to be careful because of the wires" sometimes "on edge when walking around". He states that he prays to get thru stressful situations and reminds himself "it is a temporary thing it's gonna get better" He denies any suicidal ideation. He scored a 3 on the PHQ2 and a  17 on the HADS which is an abnormal score. He denies need for a counselor or psychiatrist. He is hopeful to attend the support group and agreed to speak with CSW when needed for supportive counseling.   Patient described his life as 100% turn around since receiving the VAD. He states he can function better and independent. He noted his biggest concern is "being cautious all the time and power outages".  He denies any other concerns and feels supported by the VAD team. Patient's caregiver was not available at the time of reassessment. Patient appears to have some depression but feels supported and denies any intervention at this time. CSW will continue to monitor and support patient as needed. CSW will also make VAD Coordinator aware of reassessment and HADS score. Raquel Sarna, [place]

## 2014-05-27 ENCOUNTER — Encounter: Payer: Self-pay | Admitting: Cardiovascular Disease

## 2014-05-28 ENCOUNTER — Encounter (HOSPITAL_COMMUNITY): Payer: BC Managed Care – PPO

## 2014-05-31 ENCOUNTER — Telehealth (HOSPITAL_COMMUNITY): Payer: Self-pay | Admitting: Infectious Diseases

## 2014-05-31 ENCOUNTER — Other Ambulatory Visit (HOSPITAL_COMMUNITY): Payer: Self-pay | Admitting: Infectious Diseases

## 2014-05-31 ENCOUNTER — Encounter (HOSPITAL_COMMUNITY): Payer: Self-pay | Admitting: Infectious Diseases

## 2014-05-31 ENCOUNTER — Other Ambulatory Visit (HOSPITAL_COMMUNITY): Payer: Self-pay | Admitting: Internal Medicine

## 2014-05-31 DIAGNOSIS — Z01818 Encounter for other preprocedural examination: Secondary | ICD-10-CM

## 2014-05-31 DIAGNOSIS — Z79899 Other long term (current) drug therapy: Secondary | ICD-10-CM

## 2014-05-31 DIAGNOSIS — Z7682 Awaiting organ transplant status: Secondary | ICD-10-CM

## 2014-05-31 NOTE — Telephone Encounter (Signed)
A user error has taken place: orders placed in error, not carried out on this patient.

## 2014-06-04 ENCOUNTER — Ambulatory Visit (HOSPITAL_COMMUNITY)
Admission: RE | Admit: 2014-06-04 | Discharge: 2014-06-04 | Disposition: A | Discharge status: Home / Self Care | Payer: BLUE CROSS/BLUE SHIELD | Source: Ambulatory Visit | Attending: Internal Medicine | Admitting: Internal Medicine

## 2014-06-04 ENCOUNTER — Ambulatory Visit (HOSPITAL_COMMUNITY): Payer: Self-pay | Admitting: *Deleted

## 2014-06-04 ENCOUNTER — Encounter (HOSPITAL_COMMUNITY): Payer: Self-pay | Admitting: *Deleted

## 2014-06-04 ENCOUNTER — Ambulatory Visit (HOSPITAL_BASED_OUTPATIENT_CLINIC_OR_DEPARTMENT_OTHER)
Admission: RE | Admit: 2014-06-04 | Discharge: 2014-06-04 | Disposition: A | Discharge status: Home / Self Care | Payer: BLUE CROSS/BLUE SHIELD | Source: Ambulatory Visit

## 2014-06-04 ENCOUNTER — Other Ambulatory Visit: Payer: BLUE CROSS/BLUE SHIELD

## 2014-06-04 DIAGNOSIS — Z01818 Encounter for other preprocedural examination: Secondary | ICD-10-CM | POA: Insufficient documentation

## 2014-06-04 DIAGNOSIS — I6523 Occlusion and stenosis of bilateral carotid arteries: Secondary | ICD-10-CM | POA: Insufficient documentation

## 2014-06-04 DIAGNOSIS — I5022 Chronic systolic (congestive) heart failure: Secondary | ICD-10-CM

## 2014-06-04 DIAGNOSIS — I5023 Acute on chronic systolic (congestive) heart failure: Secondary | ICD-10-CM

## 2014-06-04 DIAGNOSIS — I472 Ventricular tachycardia, unspecified: Secondary | ICD-10-CM

## 2014-06-04 DIAGNOSIS — Z7901 Long term (current) use of anticoagulants: Secondary | ICD-10-CM

## 2014-06-04 DIAGNOSIS — Z95810 Presence of automatic (implantable) cardiac defibrillator: Secondary | ICD-10-CM

## 2014-06-04 DIAGNOSIS — Z95811 Presence of heart assist device: Secondary | ICD-10-CM

## 2014-06-04 DIAGNOSIS — Z789 Other specified health status: Secondary | ICD-10-CM | POA: Diagnosis not present

## 2014-06-04 DIAGNOSIS — Z7682 Awaiting organ transplant status: Secondary | ICD-10-CM

## 2014-06-04 LAB — PROTIME-INR
INR: 2.13 — ABNORMAL HIGH (ref 0.00–1.49)
Prothrombin Time: 24 seconds — ABNORMAL HIGH (ref 11.6–15.2)

## 2014-06-04 NOTE — Progress Notes (Signed)
Bilateral carotid artery duplex completed: 1-39% ICA stenosis.  Vertebral artery flow is antegrade.  Abnormal  Doppler waveforms noted throughout.

## 2014-06-04 NOTE — Progress Notes (Signed)
Pt here for INR, ABI, and carotid. Asked for driveline repair and dressing change.   LVAD Exit Site:  VAD dressing removed and site care performed using sterile technique. Drive line exit site cleaned with Chlora prep applicators x 2, allowed to dry, and Sorbaview dressing with biopatch re-applied. Exit site well healed and incorporated. The velour is fully implanted at exit site. No redness, tenderness, drainage, or foul odor noted. Drive line anchor re-applied. Pt denies fever or chills. Driveline dressing is being changed weekly per sterile technique.  Multiple tears on outer silicone covering of driveline.  Driveline repair with rescue tape performed.

## 2014-06-04 NOTE — Progress Notes (Signed)
VASCULAR LAB PRELIMINARY  ARTERIAL  ABI completed:    RIGHT    LEFT    PRESSURE WAVEFORM  PRESSURE WAVEFORM  BRACHIAL 99 triphasic BRACHIAL 94 triphasic  DP   DP    AT 103 monophasic AT 102 Dampened monophasic  PT 105 triphasic PT 107 triphasic  PER   PER    GREAT TOE  NA GREAT TOE  NA    RIGHT LEFT  ABI >1.0 >1.0     Louiza Moor, RVT 06/04/2014, 12:55 PM

## 2014-06-05 ENCOUNTER — Ambulatory Visit (HOSPITAL_COMMUNITY)
Admission: RE | Admit: 2014-06-05 | Discharge: 2014-06-05 | Disposition: A | Discharge status: Home / Self Care | Payer: BLUE CROSS/BLUE SHIELD | Source: Ambulatory Visit | Attending: Internal Medicine | Admitting: Internal Medicine

## 2014-06-05 ENCOUNTER — Other Ambulatory Visit (HOSPITAL_COMMUNITY): Payer: Self-pay | Admitting: Cardiology

## 2014-06-05 DIAGNOSIS — I509 Heart failure, unspecified: Secondary | ICD-10-CM | POA: Insufficient documentation

## 2014-06-05 DIAGNOSIS — I5022 Chronic systolic (congestive) heart failure: Secondary | ICD-10-CM

## 2014-06-05 DIAGNOSIS — Z01818 Encounter for other preprocedural examination: Secondary | ICD-10-CM | POA: Diagnosis present

## 2014-06-05 DIAGNOSIS — Z95811 Presence of heart assist device: Secondary | ICD-10-CM

## 2014-06-05 DIAGNOSIS — I420 Dilated cardiomyopathy: Secondary | ICD-10-CM

## 2014-06-05 MED ORDER — PANTOPRAZOLE SODIUM 40 MG PO TBEC: 40.0000 mg | DELAYED_RELEASE_TABLET | Freq: Every day | ORAL | Status: DC

## 2014-06-10 ENCOUNTER — Encounter (HOSPITAL_COMMUNITY): Payer: Self-pay | Admitting: Internal Medicine

## 2014-06-10 ENCOUNTER — Ambulatory Visit (HOSPITAL_COMMUNITY)
Admission: RE | Admit: 2014-06-10 | Discharge: 2014-06-10 | Disposition: A | Discharge status: Home / Self Care | Payer: BLUE CROSS/BLUE SHIELD | Source: Ambulatory Visit | Attending: Internal Medicine | Admitting: Internal Medicine

## 2014-06-10 ENCOUNTER — Encounter (HOSPITAL_COMMUNITY)
Admission: RE | Disposition: A | Discharge status: Home / Self Care | Payer: Self-pay | Source: Ambulatory Visit | Attending: Internal Medicine

## 2014-06-10 DIAGNOSIS — I272 Other secondary pulmonary hypertension: Secondary | ICD-10-CM | POA: Insufficient documentation

## 2014-06-10 DIAGNOSIS — Z7901 Long term (current) use of anticoagulants: Secondary | ICD-10-CM | POA: Diagnosis not present

## 2014-06-10 DIAGNOSIS — I10 Essential (primary) hypertension: Secondary | ICD-10-CM | POA: Diagnosis not present

## 2014-06-10 DIAGNOSIS — I5022 Chronic systolic (congestive) heart failure: Secondary | ICD-10-CM | POA: Diagnosis present

## 2014-06-10 DIAGNOSIS — I4510 Unspecified right bundle-branch block: Secondary | ICD-10-CM | POA: Insufficient documentation

## 2014-06-10 DIAGNOSIS — I472 Ventricular tachycardia: Secondary | ICD-10-CM | POA: Insufficient documentation

## 2014-06-10 DIAGNOSIS — I429 Cardiomyopathy, unspecified: Secondary | ICD-10-CM | POA: Diagnosis not present

## 2014-06-10 DIAGNOSIS — Z7982 Long term (current) use of aspirin: Secondary | ICD-10-CM | POA: Insufficient documentation

## 2014-06-10 DIAGNOSIS — I509 Heart failure, unspecified: Secondary | ICD-10-CM | POA: Diagnosis not present

## 2014-06-10 DIAGNOSIS — Z95810 Presence of automatic (implantable) cardiac defibrillator: Secondary | ICD-10-CM | POA: Diagnosis not present

## 2014-06-10 DIAGNOSIS — Z8701 Personal history of pneumonia (recurrent): Secondary | ICD-10-CM | POA: Diagnosis not present

## 2014-06-10 HISTORY — PX: RIGHT HEART CATHETERIZATION: SHX5447

## 2014-06-10 LAB — CBC
HEMATOCRIT: 38.5 % — AB (ref 39.0–52.0)
HEMOGLOBIN: 12.9 g/dL — AB (ref 13.0–17.0)
MCH: 29.1 pg (ref 26.0–34.0)
MCHC: 33.5 g/dL (ref 30.0–36.0)
MCV: 86.7 fL (ref 78.0–100.0)
Platelets: 132 10*3/uL — ABNORMAL LOW (ref 150–400)
RBC: 4.44 MIL/uL (ref 4.22–5.81)
RDW: 14.7 % (ref 11.5–15.5)
WBC: 5.7 10*3/uL (ref 4.0–10.5)

## 2014-06-10 LAB — BASIC METABOLIC PANEL
ANION GAP: 6 (ref 5–15)
BUN: 11 mg/dL (ref 6–23)
CHLORIDE: 107 mmol/L (ref 96–112)
CO2: 26 mmol/L (ref 19–32)
CREATININE: 1.15 mg/dL (ref 0.50–1.35)
Calcium: 8.3 mg/dL — ABNORMAL LOW (ref 8.4–10.5)
GFR, EST AFRICAN AMERICAN: 79 mL/min — AB (ref >90)
GFR, EST NON AFRICAN AMERICAN: 68 mL/min — AB (ref >90)
Glucose, Bld: 85 mg/dL (ref 70–99)
Potassium: 3.5 mmol/L (ref 3.5–5.1)
Sodium: 139 mmol/L (ref 135–145)

## 2014-06-10 LAB — PROTIME-INR
INR: 2.4 — ABNORMAL HIGH (ref 0.00–1.49)
PROTHROMBIN TIME: 26.3 s — AB (ref 11.6–15.2)

## 2014-06-10 SURGERY — RIGHT HEART CATH
Anesthesia: LOCAL

## 2014-06-10 MED ORDER — SODIUM CHLORIDE 0.9 % IJ SOLN: 3.0000 mL | INTRAMUSCULAR | Status: DC | PRN

## 2014-06-10 MED ORDER — SODIUM CHLORIDE 0.9 % IJ SOLN: 3.0000 mL | Freq: Two times a day (BID) | INTRAMUSCULAR | Status: DC

## 2014-06-10 MED ORDER — MIDAZOLAM HCL 2 MG/2ML IJ SOLN
INTRAMUSCULAR | Status: AC
  Filled 2014-06-10: qty 2

## 2014-06-10 MED ORDER — HEPARIN (PORCINE) IN NACL 2-0.9 UNIT/ML-% IJ SOLN
INTRAMUSCULAR | Status: AC
  Filled 2014-06-10: qty 1500

## 2014-06-10 MED ORDER — ONDANSETRON HCL 4 MG/2ML IJ SOLN: 4.0000 mg | Freq: Four times a day (QID) | INTRAMUSCULAR | Status: DC | PRN

## 2014-06-10 MED ORDER — ASPIRIN 81 MG PO CHEW
CHEWABLE_TABLET | ORAL | Status: AC
  Administered 2014-06-10: 81 mg via ORAL
  Filled 2014-06-10: qty 1

## 2014-06-10 MED ORDER — ACETAMINOPHEN 325 MG PO TABS: 650.0000 mg | ORAL_TABLET | ORAL | Status: DC | PRN

## 2014-06-10 MED ORDER — FENTANYL CITRATE 0.05 MG/ML IJ SOLN
INTRAMUSCULAR | Status: AC
  Filled 2014-06-10: qty 2

## 2014-06-10 MED ORDER — SODIUM CHLORIDE 0.9 % IV SOLN: 250.0000 mL | INTRAVENOUS | Status: DC | PRN

## 2014-06-10 MED ORDER — LIDOCAINE HCL (PF) 1 % IJ SOLN
INTRAMUSCULAR | Status: AC
  Filled 2014-06-10: qty 30

## 2014-06-10 MED ORDER — SODIUM CHLORIDE 0.9 % IV SOLN
INTRAVENOUS | Status: DC
  Administered 2014-06-10: 10:00:00 via INTRAVENOUS

## 2014-06-10 MED ORDER — ASPIRIN 81 MG PO CHEW
81.0000 mg | CHEWABLE_TABLET | ORAL | Status: AC
  Administered 2014-06-10: 81 mg via ORAL

## 2014-06-10 NOTE — Discharge Instructions (Signed)
Remove bandage from right neck after 24hrs and may shower then; call Dr Bensimhon's office if any problems,questions, or concerns; call his office if any bleeding,drainage,fever,pain,redness or swelling right neck site

## 2014-06-10 NOTE — H&P (View-Only) (Signed)
Patient ID: [name], male   DOB: 1956/03/23, 59 y.o.   MRN: [id number] PCP: N/A  HPI: [name] is a 60 year old male with a history of HTN and advanced CHF due to non ischemic cardiomyopathy (EF: 15-20% with severe MR) s/p HM II LVAD implant 05/2012. status post dual chamber Medtronic ICD implanted April 2011. Cath 2011 showed normal coronaries. He also has a history of VTach. Currently listed as 1b list for heart tx at [name].   Follow-up: At last visit VAD speed decreased to 9600. Feels much better. Much less dizziness. Denies dyspnea, SOB, edema. Seen recently at [name] for dual listing and had good visit. They indicated that they would consider using his 1A time at some point. Cardura started at  daily.   Denies LVAD alarms.  Denies driveline trauma, erythema or drainage.  Denies ICD shocks.Reports taking Coumadin as prescribed and adherence to anticoagulation based dietary restrictions.  Denies bright red blood per rectum or melena, no dark urine or hematuria.     Past Medical History  Diagnosis Date  . AICD (automatic cardioverter/defibrillator) present   . Hypertension   . Congestive heart failure   . Pneumonia   . Bronchitis   . Pulmonary hypertension     Current Outpatient Prescriptions  Medication Sig Dispense Refill  . allopurinol (ZYLOPRIM) 300 MG tablet Take 1 tablet (300 mg total) by mouth daily. 90 tablet 3  . amiodarone (PACERONE) 200 MG tablet Take 1 tablet (200 mg total) by mouth daily. 90 tablet 3  . amLODipine (NORVASC) 10 MG tablet Take 1 tablet (10 mg total) by mouth daily. 90 tablet 3  . aspirin EC 325 MG tablet Take 1 tablet (325 mg total) by mouth daily. 30 tablet 6  . doxazosin (CARDURA) 2 MG tablet Take 1 tablet (2 mg total) by mouth daily. 30 tablet 6  . hydrALAZINE (APRESOLINE) 100 MG tablet Take 1 tablet (100 mg total) by mouth 2 (two) times daily. 180 tablet 3  . lisinopril (PRINIVIL,ZESTRIL) 20 MG tablet Take 2 tablets ( )in the morning, and 1  tablet ( ) in the evening. 270 tablet 3  . pantoprazole (PROTONIX) 40 MG tablet Take 1 tablet (40 mg total) by mouth daily. 30 tablet 6  . warfarin (COUMADIN) 5 MG tablet Take 5-7.5 mg by mouth daily. Take as directed for INR goal 2.0 - 3.0. Take 1 tablet ( ) all days except Friday take 1 1/2 tablet (7.5mg )     No current facility-administered medications for this encounter.    Imdur and Metoprolol  REVIEW OF SYSTEMS: All systems negative except as listed in HPI, PMH and Problem list.   LVAD interrogation reveals:  Speed: 9600 Flow: 4.7 Power: 5.8 PI: 7.0 Alarms: none Events: 0 - 5 PI events Fixed speed: 9600 Low speed limit: 9000  11V battery status:  Primary controller: replace 18 months (January 2017) Secondary controller: Replace 18 months (January 2017)  I reviewed the LVAD parameters from today, and compared the results to the patient's prior recorded data.  No programming changes were made.  The LVAD is functioning within specified parameters.  The patient performs LVAD self-test daily.  LVAD interrogation was negative for any significant power changes, alarms or PI events/speed drops.  LVAD equipment check completed and is in good working order.  Back-up equipment present.   LVAD education done on emergency procedures and precautions and reviewed exit site care.    Filed Vitals:   05/15/14 1002  BP: 100/0  Pulse: 75  Height: 6\' 2"  (1.88 m)  Weight: 247 lb 3.2 oz (112.129 kg)  SpO2: 99%   MAP 110 Physical Exam: GENERAL: Well appearing, male who presents to clinic today in no acute distress. HEENT: normal  NECK: Supple, JVP flat;  2+ bilaterally, no bruits.  No lymphadenopathy or thyromegaly appreciated.   CARDIAC:  Mechanical heart sounds with LVAD hum present.  LUNGS:  Clear to auscultation bilaterally.  ABDOMEN:  Soft, round, nontender, positive bowel sounds x4.     LVAD exit site: well-healed and incorporated.  Dressing dry and intact.  No  erythema or drainage.  Stabilization device present and accurately applied.  Driveline dressing is being changed daily per sterile technique. EXTREMITIES:  Warm and dry, no cyanosis, clubbing, rash or edema  NEUROLOGIC:  Alert and oriented x 4.  Gait steady.  No aphasia.  No dysarthria.  Affect pleasant.      ASSESSMENT AND PLAN:   1) Chronic systolic HF, s/p HMII LVAD implant 05/2012. Currently 1B at [name].  - He is improved with VAD speed down to 9600 NYHA II symptoms and volume status stable. - Now being [facility] at [name] - He is not on a BB. He was able to tolerate Toprol before LVAD, however while in the [name] with implant over a year ago had syncope on higher dose.  - Continue lisinopril, norvasc, doxazosin, and hydralazine at current doses.  Intolerant of Imdur d/t headaches.  - Reinforced the need and importance of daily weights, a low sodium diet, and fluid restriction (less than 2 L a day). Instructed to call the HF clinic if weight increases more than 3 lbs overnight or 5 lbs in a week.  2) NSVT/VT  - Continue amiodarone 200 mg daily will not stop with history of NSVT 3) HTN-  - MAP 110.  Increase cardura to 2mg  daily   4) Anticoagulation with coumadin  - Continue coumadin and ASA 325 mg for LVAD. Check INR and adjust coumadin accordingly per HF pharmacy.   5) LVAD  - Improved on 9600. Continue transplant process.    Robert Meres MD  1:14 PM

## 2014-06-10 NOTE — Interval H&P Note (Signed)
History and Physical Interval Note:  06/10/2014 9:43 AM  [name]  has presented today for surgery, with the diagnosis of heart failure  The various methods of treatment have been discussed with the patient and family. After consideration of risks, benefits and other options for treatment, the patient has consented to  Procedure(s): RIGHT HEART CATH (N/A) as a surgical intervention .  The patient's history has been reviewed, patient examined, no change in status, stable for surgery.  I have reviewed the patient's chart and labs.  Questions were answered to the patient's satisfaction.     [name]

## 2014-06-10 NOTE — CV Procedure (Signed)
Cardiac Cath Procedure Note:  Indication:  HF; pre-transplant work-up. Patient s/p LVAD. Speed at 9600  Procedures performed:  1) Right heart catheterization  Description of procedure:   The risks and indication of the procedure were explained. Consent was signed and placed on the chart. An appropriate timeout was taken prior to the procedure. The right neck was prepped and draped in the routine sterile fashion and anesthetized with 1% local lidocaine.   A 7 FR venous sheath was placed in the right internal jugular vein using a modified Seldinger technique and u/s guidance. A standard Swan-Ganz catheter was used for the procedure.   Complications: None apparent.  Findings:  RA = 2 RV = 14/1/4 PA =  15/6 (9) PCW = 6 Fick cardiac output/index = 6.0/2.5 PVR = 0.5 WU Ao sat = 97% PA sat = 67%, 68%  Assessment:  Well compensated hemodynamics with VAD therapy.  Plan/Discussion:  Continue transplant w/u.  Arvilla Meres MD 1:42 PM

## 2014-06-10 NOTE — Addendum Note (Signed)
Encounter addended by: Dolores Patty, MD on: 06/10/2014  9:50 AM<BR>     Documentation filed: Notes Section

## 2014-06-11 LAB — POCT I-STAT 3, VENOUS BLOOD GAS (G3P V)
ACID-BASE DEFICIT: 2 mmol/L (ref 0.0–2.0)
Bicarbonate: 24.2 mEq/L — ABNORMAL HIGH (ref 20.0–24.0)
Bicarbonate: 25.7 mEq/L — ABNORMAL HIGH (ref 20.0–24.0)
O2 SAT: 68 %
O2 Saturation: 67 %
PH VEN: 7.348 — AB (ref 7.250–7.300)
PH VEN: 7.367 — AB (ref 7.250–7.300)
PO2 VEN: 37 mmHg (ref 30.0–45.0)
TCO2: 25 mmol/L (ref 0–100)
TCO2: 27 mmol/L (ref 0–100)
pCO2, Ven: 44 mmHg — ABNORMAL LOW (ref 45.0–50.0)
pCO2, Ven: 44.7 mmHg — ABNORMAL LOW (ref 45.0–50.0)
pO2, Ven: 37 mmHg (ref 30.0–45.0)

## 2014-06-25 ENCOUNTER — Other Ambulatory Visit (INDEPENDENT_AMBULATORY_CARE_PROVIDER_SITE_OTHER): Payer: BLUE CROSS/BLUE SHIELD

## 2014-06-25 ENCOUNTER — Ambulatory Visit (HOSPITAL_COMMUNITY): Payer: Self-pay | Admitting: *Deleted

## 2014-06-25 DIAGNOSIS — Z95811 Presence of heart assist device: Secondary | ICD-10-CM | POA: Diagnosis not present

## 2014-06-25 DIAGNOSIS — Z7901 Long term (current) use of anticoagulants: Secondary | ICD-10-CM | POA: Diagnosis not present

## 2014-06-25 LAB — PROTIME-INR
INR: 2.3 ratio — AB (ref 0.8–1.0)
Prothrombin Time: 24.5 s — ABNORMAL HIGH (ref 9.6–13.1)

## 2014-07-16 ENCOUNTER — Encounter (HOSPITAL_COMMUNITY): Payer: BLUE CROSS/BLUE SHIELD

## 2014-07-18 ENCOUNTER — Ambulatory Visit (HOSPITAL_COMMUNITY): Payer: Self-pay | Admitting: *Deleted

## 2014-07-18 ENCOUNTER — Ambulatory Visit (HOSPITAL_COMMUNITY)
Admission: RE | Admit: 2014-07-18 | Discharge: 2014-07-18 | Disposition: A | Discharge status: Home / Self Care | Payer: BLUE CROSS/BLUE SHIELD | Source: Ambulatory Visit | Attending: Internal Medicine | Admitting: Internal Medicine

## 2014-07-18 ENCOUNTER — Other Ambulatory Visit (HOSPITAL_COMMUNITY): Payer: Self-pay | Admitting: *Deleted

## 2014-07-18 VITALS — BP 100/0 | HR 72 | Ht 74.0 in | Wt 249.0 lb

## 2014-07-18 DIAGNOSIS — Z95811 Presence of heart assist device: Secondary | ICD-10-CM

## 2014-07-18 DIAGNOSIS — Z7901 Long term (current) use of anticoagulants: Secondary | ICD-10-CM

## 2014-07-18 DIAGNOSIS — I10 Essential (primary) hypertension: Secondary | ICD-10-CM | POA: Diagnosis not present

## 2014-07-18 DIAGNOSIS — I5022 Chronic systolic (congestive) heart failure: Secondary | ICD-10-CM | POA: Diagnosis not present

## 2014-07-18 DIAGNOSIS — I472 Ventricular tachycardia: Secondary | ICD-10-CM | POA: Insufficient documentation

## 2014-07-18 DIAGNOSIS — I272 Other secondary pulmonary hypertension: Secondary | ICD-10-CM | POA: Insufficient documentation

## 2014-07-18 DIAGNOSIS — Z7982 Long term (current) use of aspirin: Secondary | ICD-10-CM | POA: Insufficient documentation

## 2014-07-18 DIAGNOSIS — Z79899 Other long term (current) drug therapy: Secondary | ICD-10-CM | POA: Insufficient documentation

## 2014-07-18 LAB — BASIC METABOLIC PANEL
Anion gap: 7 (ref 5–15)
BUN: 12 mg/dL (ref 6–23)
CALCIUM: 8.5 mg/dL (ref 8.4–10.5)
CO2: 27 mmol/L (ref 19–32)
CREATININE: 1.26 mg/dL (ref 0.50–1.35)
Chloride: 106 mmol/L (ref 96–112)
GFR calc non Af Amer: 61 mL/min — ABNORMAL LOW (ref >90)
GFR, EST AFRICAN AMERICAN: 71 mL/min — AB (ref >90)
Glucose, Bld: 93 mg/dL (ref 70–99)
POTASSIUM: 4 mmol/L (ref 3.5–5.1)
Sodium: 140 mmol/L (ref 135–145)

## 2014-07-18 LAB — CBC
HEMATOCRIT: 41.4 % (ref 39.0–52.0)
Hemoglobin: 13.4 g/dL (ref 13.0–17.0)
MCH: 28.7 pg (ref 26.0–34.0)
MCHC: 32.4 g/dL (ref 30.0–36.0)
MCV: 88.7 fL (ref 78.0–100.0)
Platelets: 144 10*3/uL — ABNORMAL LOW (ref 150–400)
RBC: 4.67 MIL/uL (ref 4.22–5.81)
RDW: 14.8 % (ref 11.5–15.5)
WBC: 7.1 10*3/uL (ref 4.0–10.5)

## 2014-07-18 LAB — BRAIN NATRIURETIC PEPTIDE: B NATRIURETIC PEPTIDE 5: 55 pg/mL (ref 0.0–100.0)

## 2014-07-18 LAB — PROTIME-INR
INR: 2.29 — ABNORMAL HIGH (ref 0.00–1.49)
Prothrombin Time: 25.4 seconds — ABNORMAL HIGH (ref 11.6–15.2)

## 2014-07-18 LAB — LACTATE DEHYDROGENASE: LDH: 183 U/L (ref 94–250)

## 2014-07-18 MED ORDER — DOXAZOSIN MESYLATE 4 MG PO TABS: 4.0000 mg | ORAL_TABLET | Freq: Every day | ORAL | Status: DC

## 2014-07-18 NOTE — Patient Instructions (Addendum)
1.  Increase Cardura to 4 mg daily. 2.  Continue same dose coumadin; re-check INR on Aug 07, 2014. 3.  Return to clinic 2 months.

## 2014-07-18 NOTE — Progress Notes (Signed)
Symptom  Yes  No  Details   Angina       x Activity:   Claudication       x How far:   Syncope       x When: occasional dizziness with orthostatic changes (especially bending over) - improved since speed reduction  Stroke       x   Orthopnea       x How many pillows:  1 pillow  PND       x How often:  CPAP       N/A How many hrs:   Pedal edema       x   Abd fullness       x   N&V       x  good appetite; eat small meals   Diaphoresis       x When:  Bleeding      x   Urine color    light yellow  SOB       x Activity:   Palpitations        x      When: occasional; improved with speed reduction  ICD shock       x   Hospitlizaitons       x When/where/why:  ED visit       x When/where/why:  Other MD       x      When/who/why:  07/16/14 Dr. Burgess Estelle   Activity        improved; performing daily activities  Fluid      No limitations  Diet      No limitations   Vital signs: HR:  72 MAP BP:  100 Auto cuff:  111/83 (99) O2 Sat:  100 Wt:  249  lbs Last wt:  247.2  lbs Ht:  6'2"  LVAD interrogation reveals:  Speed:  9600 Flow:   5.4 Power:  6.2 PI:  6.5 Alarms:  none Events:  0 - 5 PI events Fixed speed:  9600 Low speed limit:  9000  11V battery status:  Primary controller: replace 15 months (January 2017) Secondary controller:  Replace 15 months (January 2017)   LVAD exit site:  Well healed and incorporated. The velour is fully implanted at exit site. Dressing dry and intact. No erythema or drainage. Stabilization device present and accurately applied. Driveline dressing is being changed q 3 - 5 days per sterile technique using Sorbaview dressing with biopatch on exit site. Pt does self dressing changes. Pt denies fever or chills. Pt provided with adequate dressing supplies for home use.   Pt denies any alarms or VAD equipment issues. Pt is completing weekly and monthly maintenance for LVAD equipment. LVAD equipment check completed and is in good working order. Back-up equipment  present. LVAD education done on emergency procedures and precautions and reviewed exit site care.  Pt is currently being worked up at [facility] for heart transplant evaluation as dual listing site.  He has appt with pulmonologist, Dr. Burgess Estelle on 07/25/14. Will reach out to [facility] to see if we need to repeat PFT's prior to that visit.   Hessie Diener, RN

## 2014-07-18 NOTE — Progress Notes (Signed)
Patient ID: ANSON MIRACLE, male   DOB: 1955-11-06, 59 y.o.   MRN: [id number] PCP: N/A  HPI: Mr. 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 status post dual chamber Medtronic ICD implanted April 2011. Cath 2011 showed normal coronaries. He also has a history of VTach. Currently listed as 1b list for heart tx at [facility].   Follow-up: Doing well with speed at 9600. Recent RHC looked great. Feels really good. Much less dizziness. Denies dyspnea, SOB, edema. Dual lisiting ongoing [facility]. BP much improved on Cardura. (was unable to tolerate b-blockers)  Denies LVAD alarms.  Denies driveline trauma, erythema or drainage.  Denies ICD shocks.Reports taking Coumadin as prescribed and adherence to anticoagulation based dietary restrictions.  Denies bright red blood per rectum or melena, no dark urine or hematuria.    RHC 06/10/14 RA = 2 RV = 14/1/4 PA = 15/6 (9) PCW = 6 Fick cardiac output/index = 6.0/2.5 PVR = 0.5 WU Ao sat = 97% PA sat = 67%, 68%    Past Medical History  Diagnosis Date  . AICD (automatic cardioverter/defibrillator) present   . Hypertension   . Congestive heart failure   . Pneumonia   . Bronchitis   . Pulmonary hypertension     Current Outpatient Prescriptions  Medication Sig Dispense Refill  . allopurinol (ZYLOPRIM) 300 MG tablet Take 1 tablet (300 mg total) by mouth daily. 90 tablet 3  . amiodarone (PACERONE) 200 MG tablet Take 1 tablet (200 mg total) by mouth daily. 90 tablet 3  . amLODipine (NORVASC) 10 MG tablet Take 1 tablet (10 mg total) by mouth daily. 90 tablet 3  . aspirin EC 325 MG tablet Take 1 tablet (325 mg total) by mouth daily. 30 tablet 6  . atorvastatin (LIPITOR) 20 MG tablet Take 1 tablet (20 mg total) by mouth daily. 90 tablet 3  . doxazosin (CARDURA) 4 MG tablet Take 1 tablet (4 mg total) by mouth daily. 90 tablet 3  . hydrALAZINE  (APRESOLINE) 100 MG tablet Take 1 tablet (100 mg total) by mouth 2 (two) times daily. 180 tablet 3  . lisinopril (PRINIVIL,ZESTRIL) 20 MG tablet Take 2 tablets (40mg )in the morning, and 1 tablet (20mg ) in the evening. 270 tablet 3  . pantoprazole (PROTONIX) 40 MG tablet Take 1 tablet (40 mg total) by mouth daily. 30 tablet 6  . warfarin (COUMADIN) 5 MG tablet Take 5-7.5 mg by mouth daily. Take as directed for INR goal 2.0 - 3.0. Take 1 tablet (5mg ) all days except Friday take 1 1/2 tablet (7.5mg )     No current facility-administered medications for this encounter.    Imdur and Metoprolol  REVIEW OF SYSTEMS: All systems negative except as listed in HPI, PMH and Problem list.   LVAD interrogation reveals:  Speed: 9600 Flow: 5.4 Power: 6.2 PI: 6.5 Alarms: none Events: 0 - 5 PI events Fixed speed: 9600 Low speed limit: 9000  11V battery status:  Primary controller: replace 18 months (January 2017) Secondary controller: Replace 18 months (January 2017)  I reviewed the LVAD parameters from today, and compared the results to the patient's prior recorded data.  No programming changes were made.  The LVAD is functioning within specified parameters.  The patient performs LVAD self-test daily.  LVAD interrogation was negative for any significant power changes, alarms or PI events/speed drops.  LVAD equipment check completed  and is in good working order.  Back-up equipment present.   LVAD education done on emergency procedures and precautions and reviewed exit site care.    Filed Vitals:   07/18/14 1032  BP: 100/0  Pulse: 72  Height:  (1.88 m)  Weight: 249 lb (112.946 kg)  SpO2: 100%   MAP 100 Physical Exam: GENERAL: Well appearing, male who presents to clinic today in no acute distress. HEENT: normal  NECK: Supple, JVP flat;  2+ bilaterally, no bruits.  No lymphadenopathy or thyromegaly appreciated.   CARDIAC:  Mechanical heart sounds with LVAD hum present.  LUNGS:   Clear to auscultation bilaterally.  ABDOMEN:  Soft, round, nontender, positive bowel sounds x4.     LVAD exit site: well-healed and incorporated.  Dressing dry and intact.  No erythema or drainage.  Stabilization device present and accurately applied.  Driveline dressing is being changed daily per sterile technique. EXTREMITIES:  Warm and dry, no cyanosis, clubbing, rash or edema  NEUROLOGIC:  Alert and oriented x 4.  Gait steady.  No aphasia.  No dysarthria.  Affect pleasant.      ASSESSMENT AND PLAN:   1) Chronic systolic HF, s/p HMII LVAD implant 05/2012. Currently 1B at [facility].  - He is improved with VAD speed down to 9600 NYHA II symptoms and volume status stable. - Now being dual listed at [facility]. Recent RHC looks great - He is not on a BB. He was able to tolerate Toprol before LVAD, however while in the hospital with implant over a year ago had syncope on higher dose.  - Continue lisinopril, norvasc, doxazosin, and hydralazine at current doses.  Intolerant of Imdur d/t headaches.  - Reinforced the need and importance of daily weights, a low sodium diet, and fluid restriction (less than 2 L a day). Instructed to call the HF clinic if weight increases more than 3 lbs overnight or 5 lbs in a week.  2) NSVT/VT  - Continue amiodarone 200 mg daily will not stop with history of NSVT 3) HTN-  - MAP up ~100..  Increase cardura to  daily   4) Anticoagulation with coumadin  - Continue coumadin and ASA 325 mg for LVAD. Check INR and adjust coumadin accordingly per HF pharmacy.   5) LVAD  - Improved on 9600. Continue transplant process.    Arvilla Meres MD  1:22 PM

## 2014-07-19 ENCOUNTER — Telehealth (HOSPITAL_COMMUNITY): Payer: Self-pay | Admitting: *Deleted

## 2014-07-19 NOTE — Telephone Encounter (Signed)
Called pt to reschedule PFT's for Monday 07/22/14 at 11:00am. Asked pt to report to Admissions 15 mins early, no smoking, caffeine, or inhalers 4 hours prior to testing. Will obtain test results for pt to take with him to [facility] pulmonology appt later this week as part of heart transplant evaluation. Pt verbalized understanding of above.

## 2014-07-22 ENCOUNTER — Ambulatory Visit (HOSPITAL_COMMUNITY)
Admission: RE | Admit: 2014-07-22 | Discharge: 2014-07-22 | Disposition: A | Discharge status: Home / Self Care | Payer: BLUE CROSS/BLUE SHIELD | Source: Ambulatory Visit | Attending: Internal Medicine | Admitting: Internal Medicine

## 2014-07-22 DIAGNOSIS — Z5181 Encounter for therapeutic drug level monitoring: Secondary | ICD-10-CM | POA: Diagnosis not present

## 2014-07-22 DIAGNOSIS — Z79899 Other long term (current) drug therapy: Secondary | ICD-10-CM | POA: Diagnosis not present

## 2014-07-22 LAB — PULMONARY FUNCTION TEST
DL/VA % PRED: 96 %
DL/VA: 4.65 ml/min/mmHg/L
DLCO UNC % PRED: 67 %
DLCO unc: 25.65 ml/min/mmHg
FEF 25-75 POST: 3.32 L/s
FEF 25-75 Pre: 2.59 L/sec
FEF2575-%Change-Post: 28 %
FEF2575-%PRED-POST: 99 %
FEF2575-%PRED-PRE: 77 %
FEV1-%Change-Post: 4 %
FEV1-%PRED-POST: 81 %
FEV1-%Pred-Pre: 77 %
FEV1-Post: 2.96 L
FEV1-Pre: 2.83 L
FEV1FVC-%Change-Post: 7 %
FEV1FVC-%Pred-Pre: 99 %
FEV6-%Change-Post: -2 %
FEV6-%PRED-PRE: 79 %
FEV6-%Pred-Post: 78 %
FEV6-PRE: 3.6 L
FEV6-Post: 3.52 L
FEV6FVC-%CHANGE-POST: 0 %
FEV6FVC-%Pred-Post: 102 %
FEV6FVC-%Pred-Pre: 102 %
FVC-%Change-Post: -2 %
FVC-%PRED-PRE: 78 %
FVC-%Pred-Post: 76 %
FVC-PRE: 3.63 L
FVC-Post: 3.54 L
PRE FEV6/FVC RATIO: 99 %
Post FEV1/FVC ratio: 84 %
Post FEV6/FVC ratio: 100 %
Pre FEV1/FVC ratio: 78 %
RV % PRED: 93 %
RV: 2.27 L
TLC % pred: 78 %
TLC: 6.09 L

## 2014-07-22 MED ORDER — ALBUTEROL SULFATE (2.5 MG/3ML) 0.083% IN NEBU
2.5000 mg | INHALATION_SOLUTION | Freq: Once | RESPIRATORY_TRACT | Status: AC
  Administered 2014-07-22: 2.5 mg via RESPIRATORY_TRACT

## 2014-07-25 ENCOUNTER — Encounter (HOSPITAL_COMMUNITY): Payer: BLUE CROSS/BLUE SHIELD

## 2014-08-05 ENCOUNTER — Encounter: Payer: Self-pay | Admitting: Cardiovascular Disease

## 2014-08-05 ENCOUNTER — Ambulatory Visit (INDEPENDENT_AMBULATORY_CARE_PROVIDER_SITE_OTHER): Payer: BLUE CROSS/BLUE SHIELD | Admitting: *Deleted

## 2014-08-05 DIAGNOSIS — I428 Other cardiomyopathies: Secondary | ICD-10-CM

## 2014-08-05 DIAGNOSIS — I5022 Chronic systolic (congestive) heart failure: Secondary | ICD-10-CM | POA: Diagnosis not present

## 2014-08-05 DIAGNOSIS — I429 Cardiomyopathy, unspecified: Secondary | ICD-10-CM

## 2014-08-06 NOTE — Progress Notes (Signed)
Remote ICD transmission.   

## 2014-08-07 ENCOUNTER — Other Ambulatory Visit (INDEPENDENT_AMBULATORY_CARE_PROVIDER_SITE_OTHER): Payer: BLUE CROSS/BLUE SHIELD | Admitting: *Deleted

## 2014-08-07 DIAGNOSIS — Z95811 Presence of heart assist device: Secondary | ICD-10-CM | POA: Diagnosis not present

## 2014-08-07 DIAGNOSIS — Z7901 Long term (current) use of anticoagulants: Secondary | ICD-10-CM

## 2014-08-07 LAB — PROTIME-INR
INR: 1.8 ratio — ABNORMAL HIGH (ref 0.8–1.0)
Prothrombin Time: 20.2 s — ABNORMAL HIGH (ref 9.6–13.1)

## 2014-08-07 NOTE — Addendum Note (Signed)
Addended by: Tonita Phoenix on: 08/07/2014 02:22 PM   Modules accepted: Orders

## 2014-08-08 ENCOUNTER — Other Ambulatory Visit (HOSPITAL_COMMUNITY): Payer: Self-pay | Admitting: Internal Medicine

## 2014-08-10 LAB — CUP PACEART REMOTE DEVICE CHECK
Battery Voltage: 2.72 V
Brady Statistic AP VP Percent: 0.04 %
Brady Statistic AS VP Percent: 0 %
Brady Statistic AS VS Percent: 5.82 %
Brady Statistic RA Percent Paced: 94.18 %
Brady Statistic RV Percent Paced: 0.04 %
Date Time Interrogation Session: 20160502113830
HIGH POWER IMPEDANCE MEASURED VALUE: 41 Ohm
HighPow Impedance: 48 Ohm
Lead Channel Impedance Value: 418 Ohm
Lead Channel Pacing Threshold Amplitude: 0.75 V
Lead Channel Pacing Threshold Pulse Width: 0.4 ms
Lead Channel Sensing Intrinsic Amplitude: 1.75 mV
Lead Channel Sensing Intrinsic Amplitude: 1.75 mV
Lead Channel Sensing Intrinsic Amplitude: 9.5 mV
Lead Channel Setting Pacing Amplitude: 1.5 V
Lead Channel Setting Pacing Amplitude: 2 V
MDC IDC MSMT LEADCHNL RA IMPEDANCE VALUE: 494 Ohm
MDC IDC MSMT LEADCHNL RA PACING THRESHOLD PULSEWIDTH: 0.4 ms
MDC IDC MSMT LEADCHNL RV PACING THRESHOLD AMPLITUDE: 0.875 V
MDC IDC MSMT LEADCHNL RV SENSING INTR AMPL: 9.5 mV
MDC IDC SET LEADCHNL RV PACING PULSEWIDTH: 0.4 ms
MDC IDC SET LEADCHNL RV SENSING SENSITIVITY: 0.3 mV
MDC IDC SET ZONE DETECTION INTERVAL: 350 ms
MDC IDC SET ZONE DETECTION INTERVAL: 430 ms
MDC IDC STAT BRADY AP VS PERCENT: 94.14 %
Zone Setting Detection Interval: 300 ms
Zone Setting Detection Interval: 370 ms

## 2014-08-12 ENCOUNTER — Ambulatory Visit (HOSPITAL_COMMUNITY): Payer: Self-pay | Admitting: Infectious Diseases

## 2014-08-12 DIAGNOSIS — Z95811 Presence of heart assist device: Secondary | ICD-10-CM

## 2014-08-12 DIAGNOSIS — Z7901 Long term (current) use of anticoagulants: Secondary | ICD-10-CM

## 2014-08-15 ENCOUNTER — Encounter: Payer: Self-pay | Admitting: Cardiology

## 2014-08-16 ENCOUNTER — Other Ambulatory Visit (INDEPENDENT_AMBULATORY_CARE_PROVIDER_SITE_OTHER): Payer: BLUE CROSS/BLUE SHIELD | Admitting: *Deleted

## 2014-08-16 ENCOUNTER — Ambulatory Visit (HOSPITAL_COMMUNITY): Payer: Self-pay | Admitting: Infectious Diseases

## 2014-08-16 DIAGNOSIS — Z95811 Presence of heart assist device: Secondary | ICD-10-CM

## 2014-08-16 DIAGNOSIS — Z7901 Long term (current) use of anticoagulants: Secondary | ICD-10-CM | POA: Diagnosis not present

## 2014-08-16 LAB — PROTIME-INR
INR: 2 ratio — ABNORMAL HIGH (ref 0.8–1.0)
PROTHROMBIN TIME: 21.3 s — AB (ref 9.6–13.1)

## 2014-08-16 NOTE — Addendum Note (Signed)
Addended by: Tonita Phoenix on: 08/16/2014 10:55 AM   Modules accepted: Orders

## 2014-08-19 ENCOUNTER — Other Ambulatory Visit (HOSPITAL_COMMUNITY): Payer: Self-pay | Admitting: *Deleted

## 2014-08-19 MED ORDER — WARFARIN SODIUM 5 MG PO TABS: 5.0000 mg | ORAL_TABLET | Freq: Every day | ORAL | Status: DC

## 2014-08-28 ENCOUNTER — Other Ambulatory Visit (HOSPITAL_COMMUNITY): Payer: Self-pay | Admitting: Internal Medicine

## 2014-08-28 DIAGNOSIS — Z95811 Presence of heart assist device: Secondary | ICD-10-CM

## 2014-08-29 ENCOUNTER — Encounter: Payer: Self-pay | Admitting: Cardiology

## 2014-08-30 ENCOUNTER — Ambulatory Visit (HOSPITAL_COMMUNITY): Payer: Self-pay | Admitting: Infectious Diseases

## 2014-08-30 ENCOUNTER — Other Ambulatory Visit (INDEPENDENT_AMBULATORY_CARE_PROVIDER_SITE_OTHER): Payer: BLUE CROSS/BLUE SHIELD | Admitting: *Deleted

## 2014-08-30 ENCOUNTER — Other Ambulatory Visit (HOSPITAL_COMMUNITY): Payer: Self-pay | Admitting: Infectious Diseases

## 2014-08-30 DIAGNOSIS — Z95811 Presence of heart assist device: Secondary | ICD-10-CM

## 2014-08-30 DIAGNOSIS — Z7901 Long term (current) use of anticoagulants: Secondary | ICD-10-CM | POA: Diagnosis not present

## 2014-08-30 LAB — PROTIME-INR
INR: 1.8 ratio — ABNORMAL HIGH (ref 0.8–1.0)
PROTHROMBIN TIME: 20 s — AB (ref 9.6–13.1)

## 2014-09-05 ENCOUNTER — Ambulatory Visit (HOSPITAL_COMMUNITY): Payer: Self-pay | Admitting: Infectious Diseases

## 2014-09-05 ENCOUNTER — Other Ambulatory Visit (INDEPENDENT_AMBULATORY_CARE_PROVIDER_SITE_OTHER): Payer: BLUE CROSS/BLUE SHIELD | Admitting: *Deleted

## 2014-09-05 DIAGNOSIS — Z7901 Long term (current) use of anticoagulants: Secondary | ICD-10-CM

## 2014-09-05 DIAGNOSIS — Z95811 Presence of heart assist device: Secondary | ICD-10-CM

## 2014-09-05 LAB — PROTIME-INR
INR: 2.5 ratio — AB (ref 0.8–1.0)
PROTHROMBIN TIME: 26.9 s — AB (ref 9.6–13.1)

## 2014-09-05 NOTE — Addendum Note (Signed)
Addended by: Tonita Phoenix on: 09/05/2014 11:43 AM   Modules accepted: Orders

## 2014-09-17 ENCOUNTER — Other Ambulatory Visit (HOSPITAL_COMMUNITY): Payer: Self-pay | Admitting: *Deleted

## 2014-09-17 ENCOUNTER — Inpatient Hospital Stay (HOSPITAL_COMMUNITY): Admission: RE | Admit: 2014-09-17 | Payer: BLUE CROSS/BLUE SHIELD | Source: Ambulatory Visit

## 2014-09-17 DIAGNOSIS — Z95811 Presence of heart assist device: Secondary | ICD-10-CM

## 2014-09-17 DIAGNOSIS — Z7901 Long term (current) use of anticoagulants: Secondary | ICD-10-CM

## 2014-09-19 ENCOUNTER — Other Ambulatory Visit (HOSPITAL_COMMUNITY): Payer: Self-pay | Admitting: *Deleted

## 2014-09-19 DIAGNOSIS — Z01818 Encounter for other preprocedural examination: Secondary | ICD-10-CM

## 2014-09-19 DIAGNOSIS — Z95811 Presence of heart assist device: Secondary | ICD-10-CM

## 2014-09-23 ENCOUNTER — Encounter: Payer: Self-pay | Admitting: Licensed Clinical Social Worker

## 2014-09-23 ENCOUNTER — Ambulatory Visit (HOSPITAL_COMMUNITY)
Admission: RE | Admit: 2014-09-23 | Discharge: 2014-09-23 | Disposition: A | Discharge status: Home / Self Care | Payer: BLUE CROSS/BLUE SHIELD | Source: Ambulatory Visit | Attending: Internal Medicine | Admitting: Internal Medicine

## 2014-09-23 ENCOUNTER — Encounter (HOSPITAL_COMMUNITY): Payer: Self-pay

## 2014-09-23 ENCOUNTER — Ambulatory Visit (HOSPITAL_BASED_OUTPATIENT_CLINIC_OR_DEPARTMENT_OTHER)
Admission: RE | Admit: 2014-09-23 | Discharge: 2014-09-23 | Disposition: A | Discharge status: Home / Self Care | Payer: BLUE CROSS/BLUE SHIELD | Source: Ambulatory Visit | Attending: Internal Medicine | Admitting: Internal Medicine

## 2014-09-23 ENCOUNTER — Ambulatory Visit (HOSPITAL_COMMUNITY): Payer: Self-pay | Admitting: Infectious Diseases

## 2014-09-23 VITALS — BP 115/84 | HR 74 | Ht 74.0 in | Wt 252.2 lb

## 2014-09-23 DIAGNOSIS — I5022 Chronic systolic (congestive) heart failure: Secondary | ICD-10-CM | POA: Diagnosis not present

## 2014-09-23 DIAGNOSIS — Z95811 Presence of heart assist device: Secondary | ICD-10-CM

## 2014-09-23 DIAGNOSIS — I10 Essential (primary) hypertension: Secondary | ICD-10-CM | POA: Insufficient documentation

## 2014-09-23 DIAGNOSIS — T829XXA Unspecified complication of cardiac and vascular prosthetic device, implant and graft, initial encounter: Secondary | ICD-10-CM | POA: Diagnosis not present

## 2014-09-23 DIAGNOSIS — Z01818 Encounter for other preprocedural examination: Secondary | ICD-10-CM | POA: Diagnosis present

## 2014-09-23 DIAGNOSIS — Z7982 Long term (current) use of aspirin: Secondary | ICD-10-CM | POA: Insufficient documentation

## 2014-09-23 DIAGNOSIS — I429 Cardiomyopathy, unspecified: Secondary | ICD-10-CM | POA: Insufficient documentation

## 2014-09-23 DIAGNOSIS — Z79899 Other long term (current) drug therapy: Secondary | ICD-10-CM | POA: Diagnosis not present

## 2014-09-23 DIAGNOSIS — I509 Heart failure, unspecified: Secondary | ICD-10-CM | POA: Diagnosis not present

## 2014-09-23 DIAGNOSIS — I472 Ventricular tachycardia, unspecified: Secondary | ICD-10-CM

## 2014-09-23 DIAGNOSIS — I272 Other secondary pulmonary hypertension: Secondary | ICD-10-CM | POA: Diagnosis not present

## 2014-09-23 DIAGNOSIS — Z7901 Long term (current) use of anticoagulants: Secondary | ICD-10-CM | POA: Diagnosis not present

## 2014-09-23 DIAGNOSIS — I428 Other cardiomyopathies: Secondary | ICD-10-CM

## 2014-09-23 DIAGNOSIS — I517 Cardiomegaly: Secondary | ICD-10-CM | POA: Insufficient documentation

## 2014-09-23 LAB — BASIC METABOLIC PANEL
Anion gap: 9 (ref 5–15)
BUN: 15 mg/dL (ref 6–20)
CHLORIDE: 105 mmol/L (ref 101–111)
CO2: 25 mmol/L (ref 22–32)
Calcium: 8.8 mg/dL — ABNORMAL LOW (ref 8.9–10.3)
Creatinine, Ser: 1.18 mg/dL (ref 0.61–1.24)
GFR calc non Af Amer: >60 mL/min (ref >60)
Glucose, Bld: 98 mg/dL (ref 65–99)
POTASSIUM: 3.9 mmol/L (ref 3.5–5.1)
SODIUM: 139 mmol/L (ref 135–145)

## 2014-09-23 LAB — PROTIME-INR
INR: 2.42 — ABNORMAL HIGH (ref 0.00–1.49)
Prothrombin Time: 26 seconds — ABNORMAL HIGH (ref 11.6–15.2)

## 2014-09-23 LAB — CBC
HCT: 41.7 % (ref 39.0–52.0)
HEMOGLOBIN: 14 g/dL (ref 13.0–17.0)
MCH: 28.9 pg (ref 26.0–34.0)
MCHC: 33.6 g/dL (ref 30.0–36.0)
MCV: 86 fL (ref 78.0–100.0)
Platelets: 168 10*3/uL (ref 150–400)
RBC: 4.85 MIL/uL (ref 4.22–5.81)
RDW: 14.3 % (ref 11.5–15.5)
WBC: 7 10*3/uL (ref 4.0–10.5)

## 2014-09-23 LAB — HEPATIC FUNCTION PANEL
ALT: 18 U/L (ref 17–63)
AST: 21 U/L (ref 15–41)
Albumin: 3.8 g/dL (ref 3.5–5.0)
Alkaline Phosphatase: 88 U/L (ref 38–126)
BILIRUBIN TOTAL: 1 mg/dL (ref 0.3–1.2)
Total Protein: 7.2 g/dL (ref 6.5–8.1)

## 2014-09-23 LAB — LIPID PANEL
CHOL/HDL RATIO: 3.7 ratio
Cholesterol: 224 mg/dL — ABNORMAL HIGH (ref 0–200)
HDL: 60 mg/dL (ref >40)
LDL CALC: 141 mg/dL — AB (ref 0–99)
TRIGLYCERIDES: 113 mg/dL (ref <150)
VLDL: 23 mg/dL (ref 0–40)

## 2014-09-23 LAB — BRAIN NATRIURETIC PEPTIDE: B Natriuretic Peptide: 61.6 pg/mL (ref 0.0–100.0)

## 2014-09-23 LAB — LACTATE DEHYDROGENASE: LDH: 219 U/L — AB (ref 98–192)

## 2014-09-23 MED ORDER — DOXAZOSIN MESYLATE 4 MG PO TABS: 6.0000 mg | ORAL_TABLET | Freq: Every day | ORAL | Status: DC

## 2014-09-23 NOTE — Patient Instructions (Addendum)
1. Great job swapping out your controller today!  2. Follow up with VAD clinic in 2 months  3. We will send the results of your echo and chest xray to [facility].  4. Next INR 10/14/14 at [facility] 6. We increased doxazosin (Cardura) today to 6 mg daily. So you will now need to take 1.5 pills everyday instead of 1 pill.

## 2014-09-23 NOTE — Progress Notes (Signed)
CSW met with patient in the clinic to assist with obtaining a PCP. Patient now has medicaid and is auto assigned although current PCP listed is located in [facility]. Patient wishes to have PCP in local area. CSW contacted medicaid and instructed to contact Ms Owens Shark at [phone] to switch, CSW left message for return call. Patient verbalizes understanding of process. CSW will follow for support and completion of PCP process. Raquel Sarna, [place]

## 2014-09-23 NOTE — Progress Notes (Signed)
  Echocardiogram 2D Echocardiogram has been performed.  Cathie Beams 09/23/2014, 8:58 AM

## 2014-09-23 NOTE — Progress Notes (Addendum)
Symptom  Yes  No  Details   Angina       x Activity:   Claudication       x How far:   Syncope       x When: occasional dizziness with orthostatic changes (especially bending over) - improved since speed reduction  Stroke       x   Orthopnea       x How many pillows:  1 pillow  PND       x How often:  CPAP       N/A How many hrs:   Pedal edema       x   Abd fullness       x   N&V       x  good appetite; eat small meals   Diaphoresis       x When:  Bleeding      x   Urine color    light yellow  SOB       x Activity:   Palpitations        x      When: occasional; improved with speed reduction  ICD shock       x   Hospitlizaitons       x When/where/why:  ED visit       x When/where/why:  Other MD       x      When/who/why:  [facility] last week for transplant eval   Activity        improved; performing daily activities  Fluid      No limitations  Diet      No limitations   Vital signs: HR:  74 MAP BP:  112 Auto cuff:  115/84 (97) O2 Sat:  99% Wt:  252.2 lb Last wt:   249  lb Ht:  6'2"  LVAD interrogation reveals:  Speed:  9600 Flow:  5.9 Power: 6.3 PI: 6.2 Alarms: Few low voltage advisory alarms.  Events: Unable to fully interrogate d/t damaged white lead.  Fixed speed:  9600 Low speed limit:  9000  11V battery status: Primary controller: replace 13 months (January 2017) Secondary controller:  Replace 13 months (January 2017)  Unable to fully interrogate pump due to white data lead. Appears that the white lead has become damaged with wearing current controller [phone]); it is still supplying power to the pump however unable to transmit data. Switched out primary controller with back up controller [phone] new primary).   LVAD exit site:  Well healed and incorporated. The velour is fully implanted at exit site. Dressing dry and intact. No erythema or drainage. Stabilization device present and accurately applied. Driveline dressing is being changed q 3 - 5 days per sterile  technique using Sorbaview dressing with biopatch on exit site (dressings sweat off and are needing to be changed more frequently). Pt does self dressing changes. Pt denies fever or chills. Pt provided with adequate dressing supplies for home use.   Pt denies any alarms or VAD equipment issues. Pt is completing weekly and monthly maintenance for LVAD equipment. LVAD equipment check completed and is in good working order. Back-up equipment present. LVAD education done on emergency procedures and precautions and reviewed exit site care.  Encounter Details:  Reports that he is feeling good--has good days and bad days. Not as much palpitations since decreased speed. Several low voltage advisory alarms noted at various times on the alarm review for the controller. Re-educated about not  sleeping on batteries and being proactive about changing batteries before alarms.   MAP still elevated at 97 on automatic cuff (doppler pressure correlates with SBP)--per Dr. Shirlee Latch increased Cardura to 6 mg daily. Discussed possibly stopping Amiodarone today with possible upcoming dual listing for heart transplant. Remote upload at the end of May showed 4 episodes of NSVT. Scheduled for a device clinic visit before next VAD follow up visit--will look for results or plan for full bedside interrogation next clinic visit.   Pt is currently being worked up at [facility] for heart transplant evaluation as dual listing site. Full echo and 2 view CXR done per [facility] request--will forward results.   F/U in VAD clinic per Dr. Shirlee Latch in 2 months    Rexene Alberts, RN VAD Coordinator   Office: [phone] 24/7 VAD Pager: [phone]  HPI: Mr. Horak is a 59 year old male with a history of HTN and advanced CHF due to nonischemic cardiomyopathy (EF: 15-20% with severe MR) s/p HM II LVAD implant 05/2012. status post dual chamber Medtronic ICD implanted April 2011. Cath 2011 showed normal coronaries. He also has a history of VTach. Currently listed  as 1b list for heart tx at [facility].   Follow-up: Doing well with speed at 9600, no palpitations at lower speed (has had VT events with increased speed). Recent RHC looked great. Feels really good, no exertional dyspnea.  No lightheadedness.  Dual lisiting ongoing [facility].  BP still too high.  Has not tolerated tolerated beta blockers.    As above, problem with white data lead so unable to fully interrogate device.  Changed out primary controller with backup controller which solved the problem.     RHC 06/10/14 RA = 2 RV = 14/1/4 PA = 15/6 (9) PCW = 6 Fick cardiac output/index = 6.0/2.5 PVR = 0.5 WU Ao sat = 97% PA sat = 67%, 68%  Echo (6/16): EF 20%, mildly dilated LV with mild D-shaped to the interventricular septum, unable to visualize LVAD inflow catheter (lateral position on CXR), RV mildly dilated with moderately decreased systolic function, aortic valve opens every beat.  CXR (6/16): Stable but lateral position to inflow cannula.    Past Medical History  Diagnosis Date  . AICD (automatic cardioverter/defibrillator) present   . Hypertension   . Congestive heart failure   . Pneumonia   . Bronchitis   . Pulmonary hypertension     Current Outpatient Prescriptions  Medication Sig Dispense Refill  . allopurinol (ZYLOPRIM) 300 MG tablet Take 1 tablet (300 mg total) by mouth daily. 90 tablet 3  . amiodarone (PACERONE) 200 MG tablet Take 1 tablet (200 mg total) by mouth daily. 90 tablet 3  . amLODipine (NORVASC) 10 MG tablet Take 1 tablet (10 mg total) by mouth daily. 90 tablet 3  . aspirin EC 325 MG tablet Take 1 tablet (325 mg total) by mouth daily. 30 tablet 6  . atorvastatin (LIPITOR) 20 MG tablet Take 1 tablet (20 mg total) by mouth daily. 90 tablet 3  . doxazosin (CARDURA) 4 MG tablet Take 1.5 tablets (6 mg total) by mouth daily. 90 tablet 4  . hydrALAZINE (APRESOLINE) 100 MG tablet Take 1 tablet (100 mg total) by mouth 2 (two) times daily. 180 tablet 3  . lisinopril  (PRINIVIL,ZESTRIL) 20 MG tablet Take 2 tablets (40mg )in the morning, and 1 tablet (20mg ) in the evening. 270 tablet 3  . pantoprazole (PROTONIX) 40 MG tablet Take 1 tablet (40 mg total) by mouth daily. 30 tablet 6  .  warfarin (COUMADIN) 5 MG tablet TAKE ONE & ONE-HALF TABLETS DAILY EXCEPTON MONDAY WEDNESDAY & FRIDAY.ONLY TAKE ONE TABLET 60 tablet 12   No current facility-administered medications for this encounter.    Imdur and Metoprolol  REVIEW OF SYSTEMS: All systems negative except as listed in HPI, PMH and Problem list.   LVAD interrogation reveals:  See above VAD coordinator result.   I reviewed the LVAD parameters from today, and compared the results to the patient's prior recorded data.  No programming changes were made.  The LVAD is functioning within specified parameters.  The patient performs LVAD self-test daily.  LVAD interrogation was negative for any significant power changes, alarms or PI events/speed drops.  LVAD equipment check completed and is in good working order.  Back-up equipment present.   LVAD education done on emergency procedures and precautions and reviewed exit site care.    Filed Vitals:   09/23/14 0919 09/23/14 0923  BP: 112/0 115/84  Pulse: 74   Height:  (1.88 m)   Weight: 252 lb 3.2 oz (114.397 kg)   SpO2: 99%    MAP 112 Physical Exam: GENERAL: Well appearing, male who presents to clinic today in no acute distress. HEENT: normal  NECK: Supple, JVP flat;  2+ bilaterally, no bruits.  No lymphadenopathy or thyromegaly appreciated.   CARDIAC:  Mechanical heart sounds with LVAD hum present.  LUNGS:  Clear to auscultation bilaterally.  ABDOMEN:  Soft, round, nontender, positive bowel sounds x4.     LVAD exit site: well-healed and incorporated.  Dressing dry and intact.  No erythema or drainage.  Stabilization device present and accurately applied.  Driveline dressing is being changed daily per sterile technique. EXTREMITIES:  Warm and dry, no  cyanosis, clubbing, rash or edema  NEUROLOGIC:  Alert and oriented x 4.  Gait steady.  No aphasia.  No dysarthria.  Affect pleasant.     ASSESSMENT AND PLAN:   1) Chronic systolic HF: s/p HMII LVAD implant 05/2012. Currently 1B at [facility].  He is improved with VAD speed down to 9600 NYHA II symptoms and volume status stable.  On echo today, aortic valve is opening with every beat.  I will not increase his rate as he has had VT with increased rate and today's echo showed some bowing of the interventricular septum to the left.  Recent RHC looks great.  Planning to dual list at [facility].  - He is not on a BB. He was able to tolerate Toprol before LVAD, however while in the hospital with implant over a year ago had syncope on higher dose.  - Continue lisinopril, norvasc, and hydralazine at current doses.  Intolerant of Imdur d/t headaches.  - Reinforced the need and importance of daily weights, a low sodium diet, and fluid restriction (less than 2 L a day). Instructed to call the HF clinic if weight increases more than 3 lbs overnight or 5 lbs in a week.  2) NSVT/VT: Continue amiodarone 200 mg daily,  will not stop with history of NSVT.  Check LFTs and TSH.  3) HTN: MAP 112.  Increase cardura to 6 mg daily.   4) Anticoagulation with coumadin: Continue coumadin and ASA 325 mg for LVAD. Check INR and adjust coumadin accordingly per HF pharmacy.   5) LVAD: Improved on 9600. Continue transplant process. Primary controller changed out for backup controller today with problem with white data lead.   Followup in 2 months.   Marca Ancona MD  09/23/2014

## 2014-09-30 ENCOUNTER — Telehealth: Payer: Self-pay | Admitting: Licensed Clinical Social Worker

## 2014-09-30 NOTE — Telephone Encounter (Signed)
CSW left message for medicaid caseworker Ms Manson Passey to inquire about procedure for change in PCP. CSW continues to follow as needed. [name], LCSW [phone]

## 2014-10-14 ENCOUNTER — Encounter: Payer: Self-pay | Admitting: *Deleted

## 2014-10-14 ENCOUNTER — Other Ambulatory Visit: Payer: BLUE CROSS/BLUE SHIELD

## 2014-10-14 ENCOUNTER — Telehealth: Payer: Self-pay | Admitting: Licensed Clinical Social Worker

## 2014-10-14 NOTE — Telephone Encounter (Signed)
CSW contacted patient to inform of new PCP provided by [employer] information officer. Patient will receive new medicaid card with [facility] listed as PCP effective November 04, 2014. Patient verbalizes understanding and denies any other concerns at this time. CSW will continue to follow as needed. [name], LCSW [phone]

## 2014-10-15 ENCOUNTER — Other Ambulatory Visit (INDEPENDENT_AMBULATORY_CARE_PROVIDER_SITE_OTHER): Payer: Medicaid Other | Admitting: *Deleted

## 2014-10-15 ENCOUNTER — Ambulatory Visit (HOSPITAL_COMMUNITY): Payer: Self-pay | Admitting: *Deleted

## 2014-10-15 DIAGNOSIS — Z7901 Long term (current) use of anticoagulants: Secondary | ICD-10-CM

## 2014-10-15 DIAGNOSIS — Z95811 Presence of heart assist device: Secondary | ICD-10-CM

## 2014-10-15 DIAGNOSIS — Z79899 Other long term (current) drug therapy: Secondary | ICD-10-CM | POA: Diagnosis not present

## 2014-10-15 LAB — PROTIME-INR
INR: 3.6 ratio — ABNORMAL HIGH (ref 0.8–1.0)
PROTHROMBIN TIME: 38.9 s — AB (ref 9.6–13.1)

## 2014-10-15 LAB — TSH: TSH: 0.08 u[IU]/mL — AB (ref 0.35–4.50)

## 2014-10-21 ENCOUNTER — Other Ambulatory Visit (HOSPITAL_COMMUNITY): Payer: Self-pay | Admitting: Infectious Diseases

## 2014-10-21 DIAGNOSIS — Z79899 Other long term (current) drug therapy: Secondary | ICD-10-CM

## 2014-10-22 ENCOUNTER — Other Ambulatory Visit (HOSPITAL_COMMUNITY): Payer: Self-pay | Admitting: *Deleted

## 2014-10-22 DIAGNOSIS — Z95811 Presence of heart assist device: Secondary | ICD-10-CM

## 2014-10-22 DIAGNOSIS — Z7901 Long term (current) use of anticoagulants: Secondary | ICD-10-CM

## 2014-10-22 DIAGNOSIS — Z01818 Encounter for other preprocedural examination: Secondary | ICD-10-CM

## 2014-10-23 ENCOUNTER — Other Ambulatory Visit (HOSPITAL_COMMUNITY): Payer: Self-pay | Admitting: Infectious Diseases

## 2014-10-23 ENCOUNTER — Ambulatory Visit (HOSPITAL_COMMUNITY): Payer: Self-pay | Admitting: Infectious Diseases

## 2014-10-23 ENCOUNTER — Ambulatory Visit (HOSPITAL_COMMUNITY)
Admission: RE | Admit: 2014-10-23 | Discharge: 2014-10-23 | Disposition: A | Discharge status: Home / Self Care | Payer: Medicaid Other | Source: Ambulatory Visit | Attending: Internal Medicine | Admitting: Internal Medicine

## 2014-10-23 DIAGNOSIS — I5023 Acute on chronic systolic (congestive) heart failure: Secondary | ICD-10-CM | POA: Diagnosis not present

## 2014-10-23 DIAGNOSIS — I5022 Chronic systolic (congestive) heart failure: Secondary | ICD-10-CM

## 2014-10-23 DIAGNOSIS — Z7901 Long term (current) use of anticoagulants: Secondary | ICD-10-CM

## 2014-10-23 DIAGNOSIS — R791 Abnormal coagulation profile: Secondary | ICD-10-CM

## 2014-10-23 LAB — PROTIME-INR
INR: 1.72 — AB (ref 0.00–1.49)
Prothrombin Time: 20.1 seconds — ABNORMAL HIGH (ref 11.6–15.2)

## 2014-10-23 LAB — TSH: TSH: 0.029 u[IU]/mL — ABNORMAL LOW (ref 0.350–4.500)

## 2014-10-23 LAB — ABO/RH: ABO/RH(D): O POS

## 2014-10-23 LAB — T4, FREE: Free T4: 4.61 ng/dL — ABNORMAL HIGH (ref 0.61–1.12)

## 2014-10-24 ENCOUNTER — Telehealth (HOSPITAL_COMMUNITY): Payer: Self-pay | Admitting: Infectious Diseases

## 2014-10-24 ENCOUNTER — Encounter (HOSPITAL_COMMUNITY): Payer: Self-pay | Admitting: Infectious Diseases

## 2014-10-24 LAB — T3, FREE: T3, Free: 8.3 pg/mL — ABNORMAL HIGH (ref 2.0–4.4)

## 2014-10-24 NOTE — Telephone Encounter (Signed)
-----   Message from Dolores Patty, MD sent at 10/24/2014 12:01 PM EDT ----- He is hyperthyroid. Needs to see endocrine ASAP. Either Dr. Evlyn Kanner or Dr. Lafe Garin.

## 2014-10-24 NOTE — Telephone Encounter (Signed)
Called patient to see if he had a preference as to which endocrinologist he would prefer to see.

## 2014-10-24 NOTE — Telephone Encounter (Signed)
Called with appointment for Dr. Everardo All August 17th at 2:00 p.m. for management of hyperthyroidism.

## 2014-10-30 ENCOUNTER — Ambulatory Visit (HOSPITAL_COMMUNITY): Payer: Self-pay | Admitting: *Deleted

## 2014-10-30 ENCOUNTER — Ambulatory Visit (HOSPITAL_COMMUNITY)
Admission: RE | Admit: 2014-10-30 | Discharge: 2014-10-30 | Disposition: A | Discharge status: Home / Self Care | Payer: Medicaid Other | Source: Ambulatory Visit | Attending: Internal Medicine | Admitting: Internal Medicine

## 2014-10-30 DIAGNOSIS — Z95811 Presence of heart assist device: Secondary | ICD-10-CM

## 2014-10-30 DIAGNOSIS — I5022 Chronic systolic (congestive) heart failure: Secondary | ICD-10-CM

## 2014-10-30 DIAGNOSIS — Z7901 Long term (current) use of anticoagulants: Secondary | ICD-10-CM

## 2014-10-30 DIAGNOSIS — I5023 Acute on chronic systolic (congestive) heart failure: Secondary | ICD-10-CM | POA: Insufficient documentation

## 2014-10-30 LAB — PROTIME-INR
INR: 3.22 — ABNORMAL HIGH (ref 0.00–1.49)
Prothrombin Time: 32.3 seconds — ABNORMAL HIGH (ref 11.6–15.2)

## 2014-11-04 ENCOUNTER — Ambulatory Visit (HOSPITAL_BASED_OUTPATIENT_CLINIC_OR_DEPARTMENT_OTHER)
Admission: RE | Admit: 2014-11-04 | Discharge: 2014-11-04 | Disposition: A | Discharge status: Home / Self Care | Payer: Medicaid Other | Source: Ambulatory Visit | Attending: Internal Medicine | Admitting: Internal Medicine

## 2014-11-04 ENCOUNTER — Encounter (HOSPITAL_COMMUNITY)
Admission: RE | Disposition: A | Discharge status: Home / Self Care | Payer: Self-pay | Source: Ambulatory Visit | Attending: Internal Medicine

## 2014-11-04 ENCOUNTER — Encounter: Payer: Self-pay | Admitting: Licensed Clinical Social Worker

## 2014-11-04 ENCOUNTER — Ambulatory Visit (HOSPITAL_COMMUNITY): Payer: Self-pay | Admitting: *Deleted

## 2014-11-04 ENCOUNTER — Encounter: Payer: Medicaid Other | Admitting: *Deleted

## 2014-11-04 ENCOUNTER — Ambulatory Visit (HOSPITAL_COMMUNITY)
Admission: RE | Admit: 2014-11-04 | Discharge: 2014-11-04 | Disposition: A | Discharge status: Home / Self Care | Payer: Medicaid Other | Source: Ambulatory Visit | Attending: Internal Medicine | Admitting: Internal Medicine

## 2014-11-04 VITALS — BP 108/71 | HR 77 | Ht 74.0 in | Wt 246.8 lb

## 2014-11-04 DIAGNOSIS — Z7682 Awaiting organ transplant status: Secondary | ICD-10-CM | POA: Insufficient documentation

## 2014-11-04 DIAGNOSIS — Z7901 Long term (current) use of anticoagulants: Secondary | ICD-10-CM | POA: Insufficient documentation

## 2014-11-04 DIAGNOSIS — I5022 Chronic systolic (congestive) heart failure: Secondary | ICD-10-CM | POA: Insufficient documentation

## 2014-11-04 DIAGNOSIS — I10 Essential (primary) hypertension: Secondary | ICD-10-CM | POA: Diagnosis not present

## 2014-11-04 DIAGNOSIS — Z95810 Presence of automatic (implantable) cardiac defibrillator: Secondary | ICD-10-CM | POA: Diagnosis not present

## 2014-11-04 DIAGNOSIS — Z7982 Long term (current) use of aspirin: Secondary | ICD-10-CM | POA: Insufficient documentation

## 2014-11-04 DIAGNOSIS — E0590 Thyrotoxicosis, unspecified without thyrotoxic crisis or storm: Secondary | ICD-10-CM | POA: Diagnosis not present

## 2014-11-04 DIAGNOSIS — Z95811 Presence of heart assist device: Secondary | ICD-10-CM | POA: Diagnosis not present

## 2014-11-04 DIAGNOSIS — I472 Ventricular tachycardia, unspecified: Secondary | ICD-10-CM

## 2014-11-04 DIAGNOSIS — I5023 Acute on chronic systolic (congestive) heart failure: Secondary | ICD-10-CM | POA: Diagnosis not present

## 2014-11-04 DIAGNOSIS — I428 Other cardiomyopathies: Secondary | ICD-10-CM

## 2014-11-04 DIAGNOSIS — I272 Other secondary pulmonary hypertension: Secondary | ICD-10-CM | POA: Diagnosis not present

## 2014-11-04 DIAGNOSIS — I429 Cardiomyopathy, unspecified: Secondary | ICD-10-CM

## 2014-11-04 HISTORY — PX: CARDIAC CATHETERIZATION: SHX172

## 2014-11-04 LAB — POCT I-STAT 3, VENOUS BLOOD GAS (G3P V)
BICARBONATE: 25.5 meq/L — AB (ref 20.0–24.0)
Bicarbonate: 24.3 mEq/L — ABNORMAL HIGH (ref 20.0–24.0)
O2 Saturation: 72 %
O2 Saturation: 75 %
PCO2 VEN: 38.9 mmHg — AB (ref 45.0–50.0)
PCO2 VEN: 42.4 mmHg — AB (ref 45.0–50.0)
PH VEN: 7.404 — AB (ref 7.250–7.300)
PO2 VEN: 40 mmHg (ref 30.0–45.0)
TCO2: 26 mmol/L (ref 0–100)
TCO2: 27 mmol/L (ref 0–100)
pH, Ven: 7.387 — ABNORMAL HIGH (ref 7.250–7.300)
pO2, Ven: 38 mmHg (ref 30.0–45.0)

## 2014-11-04 LAB — BASIC METABOLIC PANEL
Anion gap: 7 (ref 5–15)
BUN: 15 mg/dL (ref 6–20)
CHLORIDE: 107 mmol/L (ref 101–111)
CO2: 25 mmol/L (ref 22–32)
Calcium: 8.6 mg/dL — ABNORMAL LOW (ref 8.9–10.3)
Creatinine, Ser: 1.17 mg/dL (ref 0.61–1.24)
GFR calc Af Amer: >60 mL/min (ref >60)
Glucose, Bld: 97 mg/dL (ref 65–99)
POTASSIUM: 3.6 mmol/L (ref 3.5–5.1)
SODIUM: 139 mmol/L (ref 135–145)

## 2014-11-04 LAB — CBC
HCT: 38.2 % — ABNORMAL LOW (ref 39.0–52.0)
HEMOGLOBIN: 12.7 g/dL — AB (ref 13.0–17.0)
MCH: 28.2 pg (ref 26.0–34.0)
MCHC: 33.2 g/dL (ref 30.0–36.0)
MCV: 84.9 fL (ref 78.0–100.0)
PLATELETS: 150 10*3/uL (ref 150–400)
RBC: 4.5 MIL/uL (ref 4.22–5.81)
RDW: 13 % (ref 11.5–15.5)
WBC: 6.5 10*3/uL (ref 4.0–10.5)

## 2014-11-04 LAB — PROTIME-INR
INR: 3.12 — ABNORMAL HIGH (ref 0.00–1.49)
PROTHROMBIN TIME: 31.6 s — AB (ref 11.6–15.2)

## 2014-11-04 LAB — LACTATE DEHYDROGENASE: LDH: 228 U/L — ABNORMAL HIGH (ref 98–192)

## 2014-11-04 SURGERY — RIGHT HEART CATH

## 2014-11-04 MED ORDER — SODIUM CHLORIDE 0.9 % IJ SOLN: 3.0000 mL | Freq: Two times a day (BID) | INTRAMUSCULAR | Status: DC

## 2014-11-04 MED ORDER — SODIUM CHLORIDE 0.9 % IV SOLN: 250.0000 mL | INTRAVENOUS | Status: DC | PRN

## 2014-11-04 MED ORDER — SODIUM CHLORIDE 0.9 % IJ SOLN: 3.0000 mL | INTRAMUSCULAR | Status: DC | PRN

## 2014-11-04 MED ORDER — HEPARIN (PORCINE) IN NACL 2-0.9 UNIT/ML-% IJ SOLN
INTRAMUSCULAR | Status: AC
  Filled 2014-11-04: qty 500

## 2014-11-04 MED ORDER — ONDANSETRON HCL 4 MG/2ML IJ SOLN: 4.0000 mg | Freq: Four times a day (QID) | INTRAMUSCULAR | Status: DC | PRN

## 2014-11-04 MED ORDER — PREDNISONE 10 MG PO TABS: 10.0000 mg | ORAL_TABLET | Freq: Every day | ORAL | Status: DC

## 2014-11-04 MED ORDER — SODIUM CHLORIDE 0.9 % IV SOLN
INTRAVENOUS | Status: DC
  Administered 2014-11-04: 07:00:00 via INTRAVENOUS

## 2014-11-04 MED ORDER — LIDOCAINE HCL (PF) 1 % IJ SOLN
INTRAMUSCULAR | Status: AC
  Filled 2014-11-04: qty 30

## 2014-11-04 MED ORDER — METHIMAZOLE 10 MG PO TABS: 10.0000 mg | ORAL_TABLET | Freq: Two times a day (BID) | ORAL | Status: DC

## 2014-11-04 MED ORDER — ACETAMINOPHEN 325 MG PO TABS: 650.0000 mg | ORAL_TABLET | ORAL | Status: DC | PRN

## 2014-11-04 MED ORDER — HYDRALAZINE HCL 100 MG PO TABS: 50.0000 mg | ORAL_TABLET | Freq: Two times a day (BID) | ORAL | Status: DC

## 2014-11-04 SURGICAL SUPPLY — 10 items
CATH SWAN GANZ 7F STRAIGHT (CATHETERS) ×2 IMPLANT
COVER PRB 48X5XTLSCP FOLD TPE (BAG) IMPLANT
COVER PROBE 5X48 (BAG) ×3
HOVERMATT SINGLE USE (MISCELLANEOUS) ×2 IMPLANT
KIT HEART RIGHT NAMIC (KITS) ×3 IMPLANT
PACK CARDIAC CATHETERIZATION (CUSTOM PROCEDURE TRAY) ×3 IMPLANT
PROTECTION STATION PRESSURIZED (MISCELLANEOUS) ×3
SHEATH PINNACLE 7F 10CM (SHEATH) ×2 IMPLANT
STATION PROTECTION PRESSURIZED (MISCELLANEOUS) IMPLANT
TRANSDUCER W/STOPCOCK (MISCELLANEOUS) ×4 IMPLANT

## 2014-11-04 NOTE — Progress Notes (Signed)
Patient ID: Robert [name], male   DOB: 1955/07/28, 59 y.o.   MRN: [id number] PCP: N/A  HPI: Mr. 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 status post dual chamber Medtronic ICD implanted April 2011. Cath 2011 showed normal coronaries. He also has a history of VTach. Currently listed as 1b list for heart tx at [facility].   He is here for unscheduled visit after RHC which was done as part of transplant listing at [facility] (already listed at [facility]). [place] numbers looked great with low filling pressures and normal cardiac output. Says he just feels fatigued. Able to do all activities just more tired. Only gets SOB when he bends over. Stopped hydralazine due to HA. MAPs now back to 100. No edema. No palpitations. Last labs with TSH 0.03 and elevated Free T4 (4.6) and T3 (8.3)  Denies LVAD alarms.  Denies driveline trauma, erythema or drainage.  Denies ICD shocks.Reports taking Coumadin as prescribed and adherence to anticoagulation based dietary restrictions.  Denies bright red blood per rectum or melena, no dark urine or hematuria.    LVAD interrogation reveals:  Speed: 9600 Flow: 5.4 Power: 6.1 PI: 6.6 Alarms: several low voltage advisories Events: Rare PI event Fixed speed: 9600 Low speed limit: 9000   Past Medical History  Diagnosis Date  . AICD (automatic cardioverter/defibrillator) present   . Hypertension   . Congestive heart failure   . Pneumonia   . Bronchitis   . Pulmonary hypertension     No current facility-administered medications for this encounter.   Current Outpatient Prescriptions  Medication Sig Dispense Refill  . hydrALAZINE (APRESOLINE) 100 MG tablet Take 0.5 tablets (50 mg total) by mouth 2 (two) times daily. 30 tablet 6  . methimazole (TAPAZOLE) 10 MG tablet Take 1 tablet (10 mg total) by mouth 2 (two) times daily. 60 tablet 1  . predniSONE  (DELTASONE) 10 MG tablet Take 1 tablet (10 mg total) by mouth daily with breakfast. 30 tablet 1   Facility-Administered Medications Ordered in Other Encounters  Medication Dose Route Frequency Provider Last Rate Last Dose  . 0.9 %  sodium chloride infusion  250 mL Intravenous PRN Jolaine Artist, MD      . Derrill Memo ON 11/05/2014] 0.9 %  sodium chloride infusion   Intravenous Continuous Jolaine Artist, MD 10 mL/hr at 11/04/14 0636    . 0.9 %  sodium chloride infusion  250 mL Intravenous PRN Jolaine Artist, MD      . 0.9 %  sodium chloride infusion  250 mL Intravenous PRN Jolaine Artist, MD      . acetaminophen (TYLENOL) tablet 650 mg  650 mg Oral Q4H PRN Jolaine Artist, MD      . ondansetron [facility]) injection 4 mg  4 mg Intravenous Q6H PRN Jolaine Artist, MD      . sodium chloride 0.9 % injection 3 mL  3 mL Intravenous Q12H Shaune Pascal Khloey Chern, MD      . sodium chloride 0.9 % injection 3 mL  3 mL Intravenous PRN Shaune Pascal Avarey Yaeger, MD      . sodium chloride 0.9 % injection 3 mL  3 mL Intravenous Q12H Shaune Pascal Sibel Khurana, MD      . sodium chloride 0.9 % injection 3 mL  3 mL Intravenous PRN Jolaine Artist, MD      .  sodium chloride 0.9 % injection 3 mL  3 mL Intravenous Q12H Shaune Pascal Evania Lyne, MD      . sodium chloride 0.9 % injection 3 mL  3 mL Intravenous PRN Jolaine Artist, MD        Imdur and Metoprolol  REVIEW OF SYSTEMS: All systems negative except as listed in HPI, PMH and Problem list.   LVAD interrogation reveals:  Speed: 9600 Flow: 5.4 Power: 6.1 PI: 6.6 Alarms: several low voltage advisories Events: Rare PI event Fixed speed: 9600 Low speed limit: 9000 11V battery status:  Primary controller: replace 18 months (January 2017) Secondary controller: Replace 18 months (January 2017)  I reviewed the LVAD parameters from today, and compared the results to the patient's prior recorded data.  No programming changes were made.  The LVAD is  functioning within specified parameters.  The patient performs LVAD self-test daily.  LVAD interrogation was negative for any significant power changes, alarms or PI events/speed drops.  LVAD equipment check completed and is in good working order.  Back-up equipment present.   LVAD education done on emergency procedures and precautions and reviewed exit site care.    Filed Vitals:   11/04/14 1052 11/04/14 1053  BP: 100/0 108/71  Pulse:  77  Height:  6' 2"  (1.88 m)  Weight:  246 lb 12.8 oz (111.948 kg)  SpO2:  97%   MAP 100 Physical Exam: GENERAL: Well appearing, male who presents to clinic today in no acute distress. HEENT: normal  NECK: Supple, JVP flat;  2+ bilaterally, no bruits. RIJ site stable after cath.  No lymphadenopathy or thyromegaly appreciated.   CARDIAC:  Mechanical heart sounds with LVAD hum present.  LUNGS:  Clear to auscultation bilaterally.  ABDOMEN:  Soft, round, nontender, positive bowel sounds x4.     LVAD exit site: well-healed and incorporated.  Dressing dry and intact.  No erythema or drainage.  Stabilization device present and accurately applied.  Driveline dressing is being changed daily per sterile technique. EXTREMITIES:  Warm and dry, no cyanosis, clubbing, rash or edema  NEUROLOGIC:  Alert and oriented x 4.  Gait steady.  No aphasia.  No dysarthria.  Affect pleasant.     Recent Results (from the past 2160 hour(s))  INR/PT     Status: Abnormal   Collection Time: 08/07/14  2:22 PM  Result Value Ref Range   INR 1.8 (H) 0.8 - 1.0 ratio   Prothrombin Time 20.2 (H) 9.6 - 13.1 sec  Protime-INR     Status: Abnormal   Collection Time: 08/16/14 10:56 AM  Result Value Ref Range   INR 2.0 (H) 0.8 - 1.0 ratio   Prothrombin Time 21.3 (H) 9.6 - 13.1 sec  Protime-INR     Status: Abnormal   Collection Time: 08/30/14  1:28 PM  Result Value Ref Range   INR 1.8 (H) 0.8 - 1.0 ratio   Prothrombin Time 20.0 (H) 9.6 - 13.1 sec  Protime-INR     Status: Abnormal    Collection Time: 09/05/14 11:43 AM  Result Value Ref Range   INR 2.5 (H) 0.8 - 1.0 ratio   Prothrombin Time 26.9 (H) 9.6 - 13.1 sec  Hepatic function panel     Status: Abnormal   Collection Time: 09/23/14  9:30 AM  Result Value Ref Range   Total Protein 7.2 6.5 - 8.1 g/dL   Albumin 3.8 3.5 - 5.0 g/dL   AST 21 15 - 41 U/L   ALT 18 17 - 63 U/L  Alkaline Phosphatase 88 38 - 126 U/L   Total Bilirubin 1.0 0.3 - 1.2 mg/dL   Bilirubin, Direct <0.1 (L) 0.1 - 0.5 mg/dL   Indirect Bilirubin NOT CALCULATED 0.3 - 0.9 mg/dL  Lipid Profile     Status: Abnormal   Collection Time: 09/23/14  9:42 AM  Result Value Ref Range   Cholesterol 224 (H) 0 - 200 mg/dL   Triglycerides 113 <150 mg/dL   HDL 60 >40 mg/dL   Total CHOL/HDL Ratio 3.7 RATIO   VLDL 23 0 - 40 mg/dL   LDL Cholesterol 141 (H) 0 - 99 mg/dL    Comment:        Total Cholesterol/HDL:CHD Risk Coronary Heart Disease Risk Table                     Men   Women  1/2 Average Risk   3.4   3.3  Average Risk       5.0   4.4  2 X Average Risk   9.6   7.1  3 X Average Risk  23.4   11.0        Use the calculated Patient Ratio above and the CHD Risk Table to determine the patient's CHD Risk.        ATP III CLASSIFICATION (LDL):  <100     mg/dL   Optimal  100-129  mg/dL   Near or Above                    Optimal  130-159  mg/dL   Borderline  160-189  mg/dL   High  >190     mg/dL   Very High   Brain natriuretic peptide     Status: None   Collection Time: 09/23/14  9:43 AM  Result Value Ref Range   B Natriuretic Peptide 61.6 0.0 - 100.0 pg/mL  Basic metabolic panel     Status: Abnormal   Collection Time: 09/23/14  9:44 AM  Result Value Ref Range   Sodium 139 135 - 145 mmol/L   Potassium 3.9 3.5 - 5.1 mmol/L   Chloride 105 101 - 111 mmol/L   CO2 25 22 - 32 mmol/L   Glucose, Bld 98 65 - 99 mg/dL   BUN 15 6 - 20 mg/dL   Creatinine, Ser 1.18 0.61 - 1.24 mg/dL   Calcium 8.8 (L) 8.9 - 10.3 mg/dL   GFR calc non Af Amer >60 >60 mL/min    GFR calc Af Amer >60 >60 mL/min    Comment: (NOTE) The eGFR has been calculated using the CKD EPI equation. This calculation has not been validated in all clinical situations. eGFR's persistently <60 mL/min signify possible Chronic Kidney Disease.    Anion gap 9 5 - 15  CBC     Status: None   Collection Time: 09/23/14  9:44 AM  Result Value Ref Range   WBC 7.0 4.0 - 10.5 K/uL   RBC 4.85 4.22 - 5.81 MIL/uL   Hemoglobin 14.0 13.0 - 17.0 g/dL   HCT 41.7 39.0 - 52.0 %   MCV 86.0 78.0 - 100.0 fL   MCH 28.9 26.0 - 34.0 pg   MCHC 33.6 30.0 - 36.0 g/dL   RDW 14.3 11.5 - 15.5 %   Platelets 168 150 - 400 K/uL  Protime-INR     Status: Abnormal   Collection Time: 09/23/14  9:44 AM  Result Value Ref Range   Prothrombin Time 26.0 (H) 11.6 - 15.2  seconds   INR 2.42 (H) 0.00 - 1.49  Lactate dehydrogenase     Status: Abnormal   Collection Time: 09/23/14  9:44 AM  Result Value Ref Range   LDH 219 (H) 98 - 192 U/L  TSH     Status: Abnormal   Collection Time: 10/15/14  1:21 PM  Result Value Ref Range   TSH 0.08 (L) 0.35 - 4.50 uIU/mL  Protime-INR     Status: Abnormal   Collection Time: 10/15/14  1:21 PM  Result Value Ref Range   INR 3.6 (H) 0.8 - 1.0 ratio   Prothrombin Time 38.9 (H) 9.6 - 13.1 sec  ABO/Rh     Status: None   Collection Time: 10/23/14 11:02 AM  Result Value Ref Range   ABO/RH(D) O POS   TSH     Status: Abnormal   Collection Time: 10/23/14 11:15 AM  Result Value Ref Range   TSH 0.029 (L) 0.350 - 4.500 uIU/mL  T4, free     Status: Abnormal   Collection Time: 10/23/14 11:15 AM  Result Value Ref Range   Free T4 4.61 (H) 0.61 - 1.12 ng/dL  T3, free     Status: Abnormal   Collection Time: 10/23/14 11:15 AM  Result Value Ref Range   T3, Free 8.3 (H) 2.0 - 4.4 pg/mL    Comment: (NOTE) Performed At: [facility] [place], [place] [id number] Lindon Romp MD [id number]   INR/PT     Status: Abnormal   Collection Time: 10/23/14 11:15 AM  Result  Value Ref Range   Prothrombin Time 20.1 (H) 11.6 - 15.2 seconds   INR 1.72 (H) 0.00 - 1.49  INR/PT     Status: Abnormal   Collection Time: 10/30/14 12:30 PM  Result Value Ref Range   Prothrombin Time 32.3 (H) 11.6 - 15.2 seconds   INR 3.22 (H) 0.00 - 1.49  CBC     Status: Abnormal   Collection Time: 11/04/14  6:38 AM  Result Value Ref Range   WBC 6.5 4.0 - 10.5 K/uL   RBC 4.50 4.22 - 5.81 MIL/uL   Hemoglobin 12.7 (L) 13.0 - 17.0 g/dL   HCT 38.2 (L) 39.0 - 52.0 %   MCV 84.9 78.0 - 100.0 fL   MCH 28.2 26.0 - 34.0 pg   MCHC 33.2 30.0 - 36.0 g/dL   RDW 13.0 11.5 - 15.5 %   Platelets 150 150 - 400 K/uL  Basic metabolic panel     Status: Abnormal   Collection Time: 11/04/14  6:38 AM  Result Value Ref Range   Sodium 139 135 - 145 mmol/L   Potassium 3.6 3.5 - 5.1 mmol/L   Chloride 107 101 - 111 mmol/L   CO2 25 22 - 32 mmol/L   Glucose, Bld 97 65 - 99 mg/dL   BUN 15 6 - 20 mg/dL   Creatinine, Ser 1.17 0.61 - 1.24 mg/dL   Calcium 8.6 (L) 8.9 - 10.3 mg/dL   GFR calc non Af Amer >60 >60 mL/min   GFR calc Af Amer >60 >60 mL/min    Comment: (NOTE) The eGFR has been calculated using the CKD EPI equation. This calculation has not been validated in all clinical situations. eGFR's persistently <60 mL/min signify possible Chronic Kidney Disease.    Anion gap 7 5 - 15  Protime-INR     Status: Abnormal   Collection Time: 11/04/14  6:38 AM  Result Value Ref Range   Prothrombin Time 31.6 (H)  11.6 - 15.2 seconds   INR 3.12 (H) 0.00 - 1.49  I-STAT 3, venous blood gas (G3P V)     Status: Abnormal   Collection Time: 11/04/14  9:10 AM  Result Value Ref Range   pH, Ven 7.387 (H) 7.250 - 7.300   pCO2, Ven 42.4 (L) 45.0 - 50.0 mmHg   pO2, Ven 38.0 30.0 - 45.0 mmHg   Bicarbonate 25.5 (H) 20.0 - 24.0 mEq/L   TCO2 27 0 - 100 mmol/L   O2 Saturation 72.0 %   Sample type VENOUS    Comment NOTIFIED PHYSICIAN   I-STAT 3, venous blood gas (G3P V)     Status: Abnormal   Collection Time: 11/04/14   9:10 AM  Result Value Ref Range   pH, Ven 7.404 (H) 7.250 - 7.300   pCO2, Ven 38.9 (L) 45.0 - 50.0 mmHg   pO2, Ven 40.0 30.0 - 45.0 mmHg   Bicarbonate 24.3 (H) 20.0 - 24.0 mEq/L   TCO2 26 0 - 100 mmol/L   O2 Saturation 75.0 %   Sample type VENOUS   Lactate dehydrogenase     Status: Abnormal   Collection Time: 11/04/14 10:49 AM  Result Value Ref Range   LDH 228 (H) 98 - 192 U/L    ASSESSMENT AND PLAN:   1) Chronic systolic HF, s/p HMII LVAD implant 05/2012. Currently 1B at [facility].  - He is improved with VAD speed down to 9600. Remains NYHA II but has increasing fatigue. Only lab abnormality is hyperthyroidism. See discussion below - Now being dual listed at [facility] and [facility]. RHC today looks great - He is not on a BB. He was able to tolerate Toprol before LVAD, however while in the hospital with implant over a year ago had syncope on higher dose.  - Continue lisinopril, norvasc, doxazosin, at current doses. Resume hydralazine at 50 bid,  Intolerant of Imdur d/t headaches.  - Reinforced the need and importance of daily weights, a low sodium diet, and fluid restriction (less than 2 L a day). Instructed to call the HF clinic if weight increases more than 3 lbs overnight or 5 lbs in a week.  2)  HTN-  - MAP up ~100. Restart hydralazine and titrate as needed. 3) Hyperthyroidism - Discussed with Dr. Forde Dandy. 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 Stop amio. Start tapazole (done today) and prednisone. Will limit dose due to risk of driveline infection. He is intoelrant of b-blockers. He has f/u with Dr. Forde Dandy in 2 weeks.  4) NSVT/VT  - Stop amio as above. ICD interrogated personally. No VT or AF. Volume status ok. Activity level increasing.  4) Anticoagulation with coumadin  - Continue coumadin and ASA 325 mg for LVAD. Check INR and adjust coumadin accordingly per HF pharmacy.   5) LVAD  - Improved on 9600. Continue transplant process. VAD parameters reviewed. Personall. Stable   Cecylia Brazill MD  7:19  PM

## 2014-11-04 NOTE — Progress Notes (Signed)
Pt to cath lab 6 with Smokey Twine and Lauro Regulus.

## 2014-11-04 NOTE — H&P (Signed)
HPI: Robert [name] is a 59 year old male with a history of HTN and advanced CHF due to nonischemic cardiomyopathy (EF: 15-20% with severe MR) s/p HM II LVAD implant 05/2012. status post dual chamber Medtronic ICD implanted April 2011. Cath 2011 showed normal coronaries. He also has a history of VTach. Currently listed as 1b list for heart tx at [facility].   Follow-up: Doing well with speed at 9600 Feels really good, no exertional dyspnea. No lightheadedness. Dual lisiting ongoing [facility] and [facility] . BP still too high. Has not tolerated tolerated beta blockers.    Presents for scheduled RHC as part of transplant listing process  RHC 06/10/14 RA = 2 RV = 14/1/4 PA = 15/6 (9) PCW = 6 Fick cardiac output/index = 6.0/2.5 PVR = 0.5 WU Ao sat = 97% PA sat = 67%, 68%  Echo (6/16): EF 20%, mildly dilated LV with mild D-shaped to the interventricular septum, unable to visualize LVAD inflow catheter (lateral position on CXR), RV mildly dilated with moderately decreased systolic function, aortic valve opens every beat.  CXR (6/16): Stable but lateral position to inflow cannula.    Past Medical History  Diagnosis Date  . AICD (automatic cardioverter/defibrillator) present   . Hypertension   . Congestive heart failure   . Pneumonia   . Bronchitis   . Pulmonary hypertension     Prior to Admission medications   Medication Sig Start Date End Date Taking? Authorizing Provider  allopurinol (ZYLOPRIM) 300 MG tablet Take 1 tablet (300 mg total) by mouth daily. Patient taking differently: Take 300 mg by mouth daily as needed (Gout).  04/04/14  Yes Dolores Patty, MD  amiodarone (PACERONE) 200 MG tablet Take 1 tablet (200 mg total) by mouth daily. 05/15/14  Yes Dolores Patty, MD  amLODipine (NORVASC) 10 MG tablet Take 1 tablet (10 mg total) by mouth daily. 05/15/14  Yes Dolores Patty, MD  aspirin EC 325 MG tablet Take 1 tablet (325 mg total) by mouth daily. 04/30/13  Yes  Dolores Patty, MD  atorvastatin (LIPITOR) 20 MG tablet Take 1 tablet (20 mg total) by mouth daily. 05/15/14  Yes Amy D Clegg, NP  doxazosin (CARDURA) 4 MG tablet Take 1.5 tablets (6 mg total) by mouth daily. 09/23/14  Yes Laurey Morale, MD  hydrALAZINE (APRESOLINE) 100 MG tablet Take 1 tablet (100 mg total) by mouth 2 (two) times daily. 05/15/14  Yes Dolores Patty, MD  lisinopril (PRINIVIL,ZESTRIL) 20 MG tablet Take 2 tablets ( )in the morning, and 1 tablet ( ) in the evening. 05/15/14  Yes Dolores Patty, MD  Multiple Vitamins-Minerals (MULTIVITAMIN WITH MINERALS) tablet Take 1 tablet by mouth daily.   Yes Historical Provider, MD  pantoprazole (PROTONIX) 40 MG tablet Take 1 tablet (40 mg total) by mouth daily. 06/05/14  Yes Dolores Patty, MD  warfarin (COUMADIN) 5 MG tablet TAKE ONE & ONE-HALF TABLETS DAILY EXCEPTON MONDAY WEDNESDAY & FRIDAY.ONLY TAKE ONE TABLET Patient taking differently: TAKE ONE & ONE-HALF TABLETS ON MONDAY AND FRIDAY, TAKE ONE TABLET ON ALL OTHER DAYS OF THE WEEK 08/28/14  Yes Dolores Patty, MD    Imdur and Metoprolol  REVIEW OF SYSTEMS: All systems negative except as listed in HPI, PMH and Problem list.   LVAD interrogation reveals:  See above VAD coordinator result.   I reviewed the LVAD parameters from today, and compared the results to the patient's prior recorded data. No programming changes were made. The LVAD is functioning within specified parameters.  The patient performs LVAD self-test daily. LVAD interrogation was negative for any significant power changes, alarms or PI events/speed drops. LVAD equipment check completed and is in good working order. Back-up equipment present. LVAD education done on emergency procedures and precautions and reviewed exit site care.    Filed Vitals:   11/04/14 0800 11/04/14 0815 11/04/14 0825 11/04/14 0844  BP: 117/90 117/92 129/96 106/67  Pulse:  70 70 60  Temp:      TempSrc:      Resp: 21  22 20 16   Height:      Weight:      SpO2:  98% 100% 98%                             MAP 110 Physical Exam: GENERAL: Well appearing, male who presents to clinic today in no acute distress. HEENT: normal  NECK: Supple, JVP flat; 2+ bilaterally, no bruits. No lymphadenopathy or thyromegaly appreciated.  CARDIAC: Mechanical heart sounds with LVAD hum present.  LUNGS: Clear to auscultation bilaterally.  ABDOMEN: Soft, round, nontender, positive bowel sounds x4.  LVAD exit site: well-healed and incorporated. Dressing dry and intact. No erythema or drainage. Stabilization device present and accurately applied. Driveline dressing is being changed daily per sterile technique. EXTREMITIES: Warm and dry, no cyanosis, clubbing, rash or edema  NEUROLOGIC: Alert and oriented x 4. Gait steady. No aphasia. No dysarthria. Affect pleasant.   ASSESSMENT AND PLAN:   1) Chronic systolic HF: s/p HMII LVAD implant 05/2012. Currently 1B at [facility]. He is improved with VAD  TSH. Remains IB on trans[facility]plat list at [facility] and [facility]. For RHC today as part of listing process. 3) HTN: MAP still elevated adjust cardura as need 4) Anticoagulation with coumadin: Continue coumadin and ASA 325 mg for LVAD . 5) LVAD: Improved on 9600. Continue transplant process. RHC toda  Quandarius Nill,MD 8:45 AM

## 2014-11-04 NOTE — Patient Instructions (Addendum)
1.  Start Hydralazine 50 mg twice daily. 2.  Return in one week for BP check. 3.  VAD clinic appt as scheduled 11/26/14.

## 2014-11-04 NOTE — Progress Notes (Signed)
Pt awaiting short stay to receive report. Rt groin level zero. Rt DP palpable.

## 2014-11-04 NOTE — Progress Notes (Signed)
Report given to Misty Stanley RN by Velva Harman. To short stay room 2.

## 2014-11-04 NOTE — Progress Notes (Signed)
Symptom  Yes  No  Details   Angina       x Activity:   Claudication       x How far:   Syncope       x When:  dizziness with orthostatic changes (especially bending over).  Stroke       x   Orthopnea       x How many pillows:  1 pillow  PND       x How often:  CPAP       N/A How many hrs:   Pedal edema       x   Abd fullness       x   N&V       x Fair appetite; eating smaller amounts; some nausea.  Diaphoresis       x When:  Bleeding      x   Urine color    light yellow  SOB       x Activity:   Palpitations        x      When: daily  ICD shock       x   Hospitlizaitons       x When/where/why:  ED visit       x When/where/why:  Other MD            x When/who/why:    Activity       Less activity based on fatigue  Fluid      No limitations  Diet      No limitations   Vital signs: HR:  77 Doppler MAP:  100 Auto cuff:  108/71 (81) O2 Sat:  97 Wt:  246.8   lbs Last wt:  252.2    lbs Ht:  6'2"  LVAD interrogation reveals:  Speed:  9600 Flow:   5.4 Power:  6.1 PI:  6.6 Alarms: several low voltage advisories Events:   Rare PI event Fixed speed:  9600 Low speed limit:  9000  11V battery status:  Primary controller: replace 11 months (January 2017) Secondary controller:  Replace 11 months (January 2017)   LVAD exit site:  Well healed and incorporated. The velour is fully implanted at exit site. Dressing dry and intact. No erythema or drainage. Stabilization device present and accurately applied. Driveline dressing is being changed q 3 - 5 days per sterile technique using Sorbaview dressing with biopatch on exit site. Pt does self dressing changes. Pt denies fever or chills. Pt provided with adequate dressing supplies for home use.   Driveline has multiple tears on outer protective covering with tape repairs intact.  Pt denies any alarms or VAD equipment issues. Pt is completing weekly and monthly maintenance for LVAD equipment. LVAD equipment check completed and is in good  working order. Back-up equipment present. LVAD education done on emergency procedures and precautions and reviewed exit site care.  Pt reports he is not taking Hydralazine at all based because of headaches.  He is taking Cardura 4 mg daily, knows it was increased to 6 mg daily, but feels he is taking enough meds. Pt states he is taking allopurinol 2 - 3 times weekly instead of daily as instructed.     Pt Instructions: 1.  Start Hydralazine 50 mg twice daily. 2.  Return in one week for BP check. 3.  VAD clinic appt as scheduled 11/26/14.   Dr. Gala Romney reviewed recent thyroid panel results with Dr. Evlyn Kanner (endocrinologist). Called patient after clinic  visit and instructed him to do the following per Dr. Gala Romney:  1.  Stop Amiodarone 2.  Start Prednisone 10 mg daily. 3.  Start Tapazol 10 mg twice daily. 4.  Pt will see Dr. Evlyn Kanner, endocrinologist. Pt confirmed Dr. Rinaldo Cloud office called with clinic appt on 11/14/14 at 9:30.    Pt verbalized understanding of above, Rx sent to local pharmacy.   Hessie Diener, RN

## 2014-11-04 NOTE — Interval H&P Note (Signed)
History and Physical Interval Note:  11/04/2014 8:47 AM  Robert [name]  has presented today for surgery, with the diagnosis of hf/lvad  The various methods of treatment have been discussed with the patient and family. After consideration of risks, benefits and other options for treatment, the patient has consented to  Procedure(s): Right Heart Cath (N/A) as a surgical intervention .  The patient's history has been reviewed, patient examined, no change in status, stable for surgery.  I have reviewed the patient's chart and labs.  Questions were answered to the patient's satisfaction.     Shallen Luedke, Reuel Boom

## 2014-11-04 NOTE — Progress Notes (Addendum)
Pt in cath holding pre cath from short stay. Stable with LVAD and ICD. Pt is alert, oriented x 4 and pain free.

## 2014-11-04 NOTE — Progress Notes (Signed)
CSW met with patient in the clinic. Patient requesting assistance with home improvement/energy efficiency programs. Patient reports he explored programs in the past but at the time had no income and was found not eligible. CSW discussed various programs and possible options. Patient will follow up and return to CSW if assistance needed with application. Raquel Sarna, [place]

## 2014-11-04 NOTE — Discharge Instructions (Signed)

## 2014-11-05 ENCOUNTER — Other Ambulatory Visit: Payer: Medicaid Other

## 2014-11-05 ENCOUNTER — Encounter (HOSPITAL_COMMUNITY): Payer: Self-pay | Admitting: Internal Medicine

## 2014-11-06 ENCOUNTER — Encounter: Payer: Self-pay | Admitting: Cardiology

## 2014-11-07 ENCOUNTER — Ambulatory Visit (HOSPITAL_COMMUNITY)
Admission: RE | Admit: 2014-11-07 | Discharge: 2014-11-07 | Disposition: A | Discharge status: Home / Self Care | Payer: Medicaid Other | Source: Ambulatory Visit | Attending: Internal Medicine | Admitting: Internal Medicine

## 2014-11-07 ENCOUNTER — Encounter (HOSPITAL_COMMUNITY): Payer: Self-pay | Admitting: *Deleted

## 2014-11-07 ENCOUNTER — Inpatient Hospital Stay (HOSPITAL_COMMUNITY): Admission: AD | Admit: 2014-11-07 | Payer: Medicaid Other | Source: Ambulatory Visit | Admitting: Internal Medicine

## 2014-11-07 ENCOUNTER — Encounter: Payer: Self-pay | Admitting: Licensed Clinical Social Worker

## 2014-11-07 ENCOUNTER — Other Ambulatory Visit (HOSPITAL_COMMUNITY): Payer: Self-pay | Admitting: Adult Health

## 2014-11-07 DIAGNOSIS — Z7682 Awaiting organ transplant status: Secondary | ICD-10-CM | POA: Insufficient documentation

## 2014-11-07 DIAGNOSIS — Z95811 Presence of heart assist device: Secondary | ICD-10-CM

## 2014-11-07 DIAGNOSIS — I272 Other secondary pulmonary hypertension: Secondary | ICD-10-CM | POA: Insufficient documentation

## 2014-11-07 DIAGNOSIS — Z79899 Other long term (current) drug therapy: Secondary | ICD-10-CM | POA: Diagnosis not present

## 2014-11-07 DIAGNOSIS — E0590 Thyrotoxicosis, unspecified without thyrotoxic crisis or storm: Secondary | ICD-10-CM | POA: Insufficient documentation

## 2014-11-07 DIAGNOSIS — I110 Hypertensive heart disease with heart failure: Secondary | ICD-10-CM | POA: Insufficient documentation

## 2014-11-07 DIAGNOSIS — I428 Other cardiomyopathies: Secondary | ICD-10-CM | POA: Diagnosis not present

## 2014-11-07 DIAGNOSIS — Z7982 Long term (current) use of aspirin: Secondary | ICD-10-CM | POA: Insufficient documentation

## 2014-11-07 DIAGNOSIS — T829XXA Unspecified complication of cardiac and vascular prosthetic device, implant and graft, initial encounter: Secondary | ICD-10-CM | POA: Diagnosis not present

## 2014-11-07 DIAGNOSIS — I5022 Chronic systolic (congestive) heart failure: Secondary | ICD-10-CM

## 2014-11-07 DIAGNOSIS — Z7901 Long term (current) use of anticoagulants: Secondary | ICD-10-CM | POA: Insufficient documentation

## 2014-11-07 NOTE — Progress Notes (Signed)
CSW met with patient in the clinic. Patient reports having difficulty obtaining hs medications from the pharmacy due to inaccurate insurance ([employer] former insurance) on file with pharmacy. Patient has medicaid and verified by [employer]. Patient was instructed to obtain letter from [facility] of cancelled policy and have it faxed to HF clinic. Patient will follow through and CSW will assist once letter received with informing pharmacy. CSW will continue to follow for support and assistance as needed. Raquel Sarna, [place]

## 2014-11-07 NOTE — Progress Notes (Addendum)
Pt presented to clinic with driveline fault alarm. States alarm started this morning while in shower while on batteries.  Placed pt on patient cable with continued driveline fault alarms. Re-set driveline fault alarm with re-occurrence of "driveline fault" within 3 minutes.  Driveline with multiple tears; resuce tape covering some, but new tears noted. Pt continues to carry controller in front jean pocket against advice. Unable to produce pump stops or red heart alarms with manipulation of lead or changing of patient position.  Event log downloaded and sent to [employer] for review.  History interrogation below:   Faulty controller: [id number] Flow:   5.5 Speed:   9600 PI:   5.9 Power:   6.3 Fixed speed:  9600 Low speed limit:  9000 Events:  0 - 5 PI events daily Alarms:  Driveline Fault alarm starting at 10:49 am today Primary Controller:  Replace back up battery in 11 months     Changed controller per recommendation of Percell Boston, Sr Engineer, [employer]. Event log downloaded from new controller and sent for review after controller change out. After review, Mr. Jearld Fenton recommendations were:   "The driveline data points in the log file from the new controller look good. At this point I do not thing any further actions needs to be taken."  [id number] replaced with [id number]. After controller changed, unable to reproduce any controller alarms with driveline manipulation or patient activity, both on patient cable and on batteries.   New controller:  [id number] Flow:   5.9 Speed:   9600 PI:   6.1 Power:   6.4 Fixed speed:  9600 Low speed limit:  9000 Primary Controller:  Replace back up battery in 32 months  Rescue tape placed on open tears on driveline. Pt agreed to wear controller on belt; extra holder/belt given to patient.   Pt will return to VAD clinic Monday for BP check, INR, and we will download event log and send to [employer] for another analysis.  Instructed pt to call if any  further VAD alarms or advisories. Pt verbalized understanding of same.  Hessie Diener, RN VAD Coordinator

## 2014-11-07 NOTE — Progress Notes (Signed)
Pt presented to clinic with driveline fault alarm. States alarm started this morning while in shower while on batteries.  Placed pt on patient cable with continued driveline fault alarms. Re-set driveline fault alarm

## 2014-11-11 ENCOUNTER — Ambulatory Visit (HOSPITAL_COMMUNITY): Payer: Self-pay | Admitting: Infectious Diseases

## 2014-11-11 ENCOUNTER — Ambulatory Visit (HOSPITAL_COMMUNITY)
Admit: 2014-11-11 | Discharge: 2014-11-11 | Disposition: A | Discharge status: Home / Self Care | Payer: Medicaid Other | Attending: Internal Medicine | Admitting: Internal Medicine

## 2014-11-11 ENCOUNTER — Telehealth: Payer: Self-pay | Admitting: Licensed Clinical Social Worker

## 2014-11-11 DIAGNOSIS — Z95811 Presence of heart assist device: Secondary | ICD-10-CM | POA: Insufficient documentation

## 2014-11-11 DIAGNOSIS — I5022 Chronic systolic (congestive) heart failure: Secondary | ICD-10-CM

## 2014-11-11 DIAGNOSIS — Z5181 Encounter for therapeutic drug level monitoring: Secondary | ICD-10-CM | POA: Insufficient documentation

## 2014-11-11 DIAGNOSIS — Z7901 Long term (current) use of anticoagulants: Secondary | ICD-10-CM | POA: Insufficient documentation

## 2014-11-11 LAB — PROTIME-INR
INR: 2.47 — AB (ref 0.00–1.49)
Prothrombin Time: 26.4 seconds — ABNORMAL HIGH (ref 11.6–15.2)

## 2014-11-11 NOTE — Telephone Encounter (Signed)
CSW received fax from [employer] provided cancellation dates of policy needed by patient to verify eligibility for medicaid. Patient will stop by office to pick up letter and provide to both pharmacy and social services. Patient denies any other concerns at this time. [name], LCSW [phone]

## 2014-11-12 ENCOUNTER — Encounter (HOSPITAL_COMMUNITY): Payer: Self-pay | Admitting: *Deleted

## 2014-11-12 ENCOUNTER — Other Ambulatory Visit: Payer: Medicaid Other

## 2014-11-12 ENCOUNTER — Telehealth: Payer: Self-pay | Admitting: Infectious Diseases

## 2014-11-12 ENCOUNTER — Other Ambulatory Visit (HOSPITAL_COMMUNITY): Payer: Self-pay | Admitting: *Deleted

## 2014-11-12 DIAGNOSIS — I472 Ventricular tachycardia, unspecified: Secondary | ICD-10-CM

## 2014-11-12 DIAGNOSIS — Z95811 Presence of heart assist device: Secondary | ICD-10-CM

## 2014-11-12 DIAGNOSIS — I10 Essential (primary) hypertension: Secondary | ICD-10-CM

## 2014-11-12 DIAGNOSIS — I5023 Acute on chronic systolic (congestive) heart failure: Secondary | ICD-10-CM

## 2014-11-12 DIAGNOSIS — I428 Other cardiomyopathies: Secondary | ICD-10-CM

## 2014-11-12 DIAGNOSIS — Z01818 Encounter for other preprocedural examination: Secondary | ICD-10-CM

## 2014-11-12 MED ORDER — HYDRALAZINE HCL 100 MG PO TABS: 100.0000 mg | ORAL_TABLET | Freq: Two times a day (BID) | ORAL | Status: DC

## 2014-11-12 NOTE — Telephone Encounter (Signed)
Called to discuss elevated MAP and instructions from Dr. Gala Romney to increase Hydralazine to 100 mg BID (he cannot adhere to TID therapy). Coming back to clinic in 2 weeks.   Reported that he finally got the prednisone and tapazole yesterday for his hyperthyroid management.

## 2014-11-12 NOTE — Progress Notes (Signed)
Sarita Bottom, [facility] Heart Transplant Coordinator called to request updated labs/tests as required by [facility] for consideration of heart transplant listing. Pt updated and he will come to VAD clinic tomorrow for labs.

## 2014-11-13 ENCOUNTER — Ambulatory Visit (HOSPITAL_COMMUNITY)
Admission: RE | Admit: 2014-11-13 | Discharge: 2014-11-13 | Disposition: A | Discharge status: Home / Self Care | Payer: Medicaid Other | Source: Ambulatory Visit | Attending: Internal Medicine | Admitting: Internal Medicine

## 2014-11-13 DIAGNOSIS — Z01818 Encounter for other preprocedural examination: Secondary | ICD-10-CM | POA: Diagnosis present

## 2014-11-13 DIAGNOSIS — Z95811 Presence of heart assist device: Secondary | ICD-10-CM | POA: Diagnosis not present

## 2014-11-13 DIAGNOSIS — I5022 Chronic systolic (congestive) heart failure: Secondary | ICD-10-CM | POA: Diagnosis not present

## 2014-11-13 NOTE — Addendum Note (Signed)
Encounter addended by: Blanchard Kelch, RN on: 11/13/2014  1:33 PM<BR>     Documentation filed: Notes Section

## 2014-11-13 NOTE — Progress Notes (Signed)
New back up controller given to Mr. Parmentier [id number] Expiration 11/02/17 and Internal Battery [id number] Exp 07/12/16. He gave me the old back up controller that no longer transmitted data.

## 2014-11-14 ENCOUNTER — Other Ambulatory Visit (HOSPITAL_COMMUNITY): Payer: Self-pay | Admitting: Endocrinology

## 2014-11-14 DIAGNOSIS — E0580 Other thyrotoxicosis without thyrotoxic crisis or storm: Secondary | ICD-10-CM

## 2014-11-14 DIAGNOSIS — T462X5A Adverse effect of other antidysrhythmic drugs, initial encounter: Secondary | ICD-10-CM

## 2014-11-14 LAB — HEPATITIS C ANTIBODY

## 2014-11-14 LAB — HEPATITIS B SURFACE ANTIGEN: HEP B S AG: NEGATIVE

## 2014-11-14 LAB — CMV ANTIBODY, IGG (EIA): CMV AB - IGG: 4.9 U/mL — AB (ref 0.00–0.59)

## 2014-11-14 LAB — INFECT DISEASE AB IGM REFLEX 1

## 2014-11-14 LAB — EPSTEIN-BARR VIRUS VCA, IGG

## 2014-11-14 LAB — TOXOPLASMA GONDII ANTIBODY, IGM: Toxoplasma Antibody- IgM: <3 AU/mL (ref 0.0–7.9)

## 2014-11-14 LAB — HEPATITIS B CORE ANTIBODY, TOTAL: Hep B Core Total Ab: NEGATIVE

## 2014-11-14 LAB — RPR: RPR Ser Ql: NONREACTIVE

## 2014-11-14 LAB — HIV ANTIBODY (ROUTINE TESTING W REFLEX): HIV Screen 4th Generation wRfx: NONREACTIVE

## 2014-11-14 LAB — CMV IGM

## 2014-11-14 LAB — HEMOGLOBIN A1C
Hgb A1c MFr Bld: 5.6 % (ref 4.8–5.6)
Mean Plasma Glucose: 114 mg/dL

## 2014-11-14 LAB — VARICELLA ZOSTER ANTIBODY, IGG: Varicella IgG: 1851 index (ref >165)

## 2014-11-14 LAB — EPSTEIN-BARR VIRUS VCA, IGM: EBV VCA IgM: <36 U/mL (ref 0.0–35.9)

## 2014-11-14 LAB — HEPATITIS B SURFACE ANTIBODY,QUALITATIVE: HEP B S AB: REACTIVE

## 2014-11-14 LAB — HEPATITIS A ANTIBODY, IGM: Hep A IgM: NEGATIVE

## 2014-11-15 ENCOUNTER — Other Ambulatory Visit (HOSPITAL_COMMUNITY): Payer: Self-pay | Admitting: *Deleted

## 2014-11-15 ENCOUNTER — Other Ambulatory Visit (HOSPITAL_COMMUNITY)
Admission: RE | Admit: 2014-11-15 | Discharge: 2014-11-15 | Disposition: A | Discharge status: Home / Self Care | Payer: Medicaid Other | Source: Ambulatory Visit | Attending: Internal Medicine | Admitting: Internal Medicine

## 2014-11-15 DIAGNOSIS — Z01818 Encounter for other preprocedural examination: Secondary | ICD-10-CM

## 2014-11-15 LAB — RAPID URINE DRUG SCREEN, HOSP PERFORMED
Amphetamines: NOT DETECTED
BARBITURATES: NOT DETECTED
Benzodiazepines: NOT DETECTED
Cocaine: NOT DETECTED
Opiates: NOT DETECTED
TETRAHYDROCANNABINOL: NOT DETECTED

## 2014-11-15 NOTE — Addendum Note (Signed)
Encounter addended by: Theresia Bough, CMA on: 11/15/2014  3:29 PM<BR>     Documentation filed: [employer], Orders

## 2014-11-18 ENCOUNTER — Ambulatory Visit (HOSPITAL_COMMUNITY): Payer: Self-pay | Admitting: Infectious Diseases

## 2014-11-18 ENCOUNTER — Telehealth (HOSPITAL_COMMUNITY): Payer: Self-pay | Admitting: Infectious Diseases

## 2014-11-18 ENCOUNTER — Other Ambulatory Visit (HOSPITAL_COMMUNITY): Payer: Self-pay | Admitting: Infectious Diseases

## 2014-11-18 ENCOUNTER — Other Ambulatory Visit (HOSPITAL_COMMUNITY): Payer: Self-pay | Admitting: *Deleted

## 2014-11-18 ENCOUNTER — Encounter (HOSPITAL_COMMUNITY): Payer: Self-pay

## 2014-11-18 ENCOUNTER — Ambulatory Visit (HOSPITAL_COMMUNITY)
Admission: RE | Admit: 2014-11-18 | Discharge: 2014-11-18 | Disposition: A | Discharge status: Home / Self Care | Payer: Medicaid Other | Source: Ambulatory Visit | Attending: Internal Medicine | Admitting: Internal Medicine

## 2014-11-18 VITALS — Wt 253.0 lb

## 2014-11-18 DIAGNOSIS — Z95811 Presence of heart assist device: Secondary | ICD-10-CM | POA: Diagnosis not present

## 2014-11-18 DIAGNOSIS — I493 Ventricular premature depolarization: Secondary | ICD-10-CM | POA: Diagnosis not present

## 2014-11-18 DIAGNOSIS — I472 Ventricular tachycardia, unspecified: Secondary | ICD-10-CM

## 2014-11-18 DIAGNOSIS — Z01818 Encounter for other preprocedural examination: Secondary | ICD-10-CM

## 2014-11-18 DIAGNOSIS — R002 Palpitations: Secondary | ICD-10-CM

## 2014-11-18 DIAGNOSIS — Z7901 Long term (current) use of anticoagulants: Secondary | ICD-10-CM

## 2014-11-18 DIAGNOSIS — I5022 Chronic systolic (congestive) heart failure: Secondary | ICD-10-CM

## 2014-11-18 LAB — BASIC METABOLIC PANEL
ANION GAP: 10 (ref 5–15)
BUN: 15 mg/dL (ref 6–20)
CALCIUM: 9 mg/dL (ref 8.9–10.3)
CO2: 23 mmol/L (ref 22–32)
Chloride: 106 mmol/L (ref 101–111)
Creatinine, Ser: 1.47 mg/dL — ABNORMAL HIGH (ref 0.61–1.24)
GFR calc Af Amer: 59 mL/min — ABNORMAL LOW (ref >60)
GFR calc non Af Amer: 50 mL/min — ABNORMAL LOW (ref >60)
GLUCOSE: 117 mg/dL — AB (ref 65–99)
Potassium: 4.1 mmol/L (ref 3.5–5.1)
Sodium: 139 mmol/L (ref 135–145)

## 2014-11-18 LAB — ETHANOL: Alcohol, Ethyl (B): <5 mg/dL (ref <5)

## 2014-11-18 LAB — MAGNESIUM: Magnesium: 2 mg/dL (ref 1.7–2.4)

## 2014-11-18 LAB — PROTIME-INR
INR: 3.43 — ABNORMAL HIGH (ref 0.00–1.49)
Prothrombin Time: 33.8 seconds — ABNORMAL HIGH (ref 11.6–15.2)

## 2014-11-18 MED ORDER — MEXILETINE HCL 150 MG PO CAPS: 150.0000 mg | ORAL_CAPSULE | Freq: Two times a day (BID) | ORAL | Status: DC

## 2014-11-18 MED ORDER — MEXILETINE HCL 150 MG PO CAPS
150.0000 mg | ORAL_CAPSULE | Freq: Two times a day (BID) | ORAL | Status: DC
Start: 2014-11-18 — End: 2016-02-13

## 2014-11-18 NOTE — Telephone Encounter (Signed)
Patient in clinic reporting increase in palpitations. Magnesium level added to blood.

## 2014-11-18 NOTE — Progress Notes (Signed)
Acute LVAD Visit:  Reason for Visit: increased palpitations  LVAD Interrogation: Flow: 6.3 Speed: 9600 Power: 6.7 PI: 4.5 Alarms: few low voltage advisories Events: 2 - 3 most days during the week   Set Speed: 9600 Back up Speed: 9000  Back up LVAD equipment present during clinic visit today. LVAD interrogation negative for any significant power elevations or off baseline PI events (normally has 0 - 5 a few days during the week).   Encounter Details:  Device interrogated by Noreene Larsson with [employer]: Having very frequent short runs of VT. Rates all around 140 and are 5 - 6 seconds in duration. High PVC burden. Therapies are ON at this point for VT > 162 and VF > 210.    Dr. Gala Romney in to evaluate patient--Robert [name] recently saw Dr. 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 Was advised at this visit not to resume amiodarone therapy. Start Mexiletine 150 mg BID (sent to [facility]).  RTC at scheduled appointment for 11/26/14 where he will have device interrogated again Noreene Larsson with [employer] already aware). INTERMACs 2.5 year visit next week.   Rexene Alberts, RN VAD Coordinator   Office: [phone] 24/7 VAD Pager: [phone]

## 2014-11-18 NOTE — Patient Instructions (Addendum)
1. [phone] [facility]  2. Start Mexiletine 150 mg (1 pill) twice a day. Call us back if you are still having a lot of palpitations after a few days and we will have you take three pills a day.   3. We will check your electrolytes and INR today. Will call you with results.   4. Keep VAD clinic appointment next Tuesday 11/26/14

## 2014-11-19 LAB — NICOTINE SCREEN, URINE: Nicotine, urine: <2 ng/mL

## 2014-11-20 ENCOUNTER — Ambulatory Visit: Payer: Medicaid Other | Admitting: Endocrinology

## 2014-11-20 LAB — HSV 1 AND 2 IGM ABS, INDIRECT
HSV 1 IgM Antibodies: <1:10 {titer}
HSV 2 IgM Antibodies: <1:10 {titer}

## 2014-11-22 ENCOUNTER — Ambulatory Visit (HOSPITAL_COMMUNITY): Payer: Medicaid Other

## 2014-11-25 IMAGING — CR DG CHEST 1V PORT
1 series · 1 of 1 positions shown · non-contrast
Comparison: Plain films 10/31/2011

CLINICAL DATA: PICC line placement

PORTABLE CHEST - 1 VIEW

[AP]
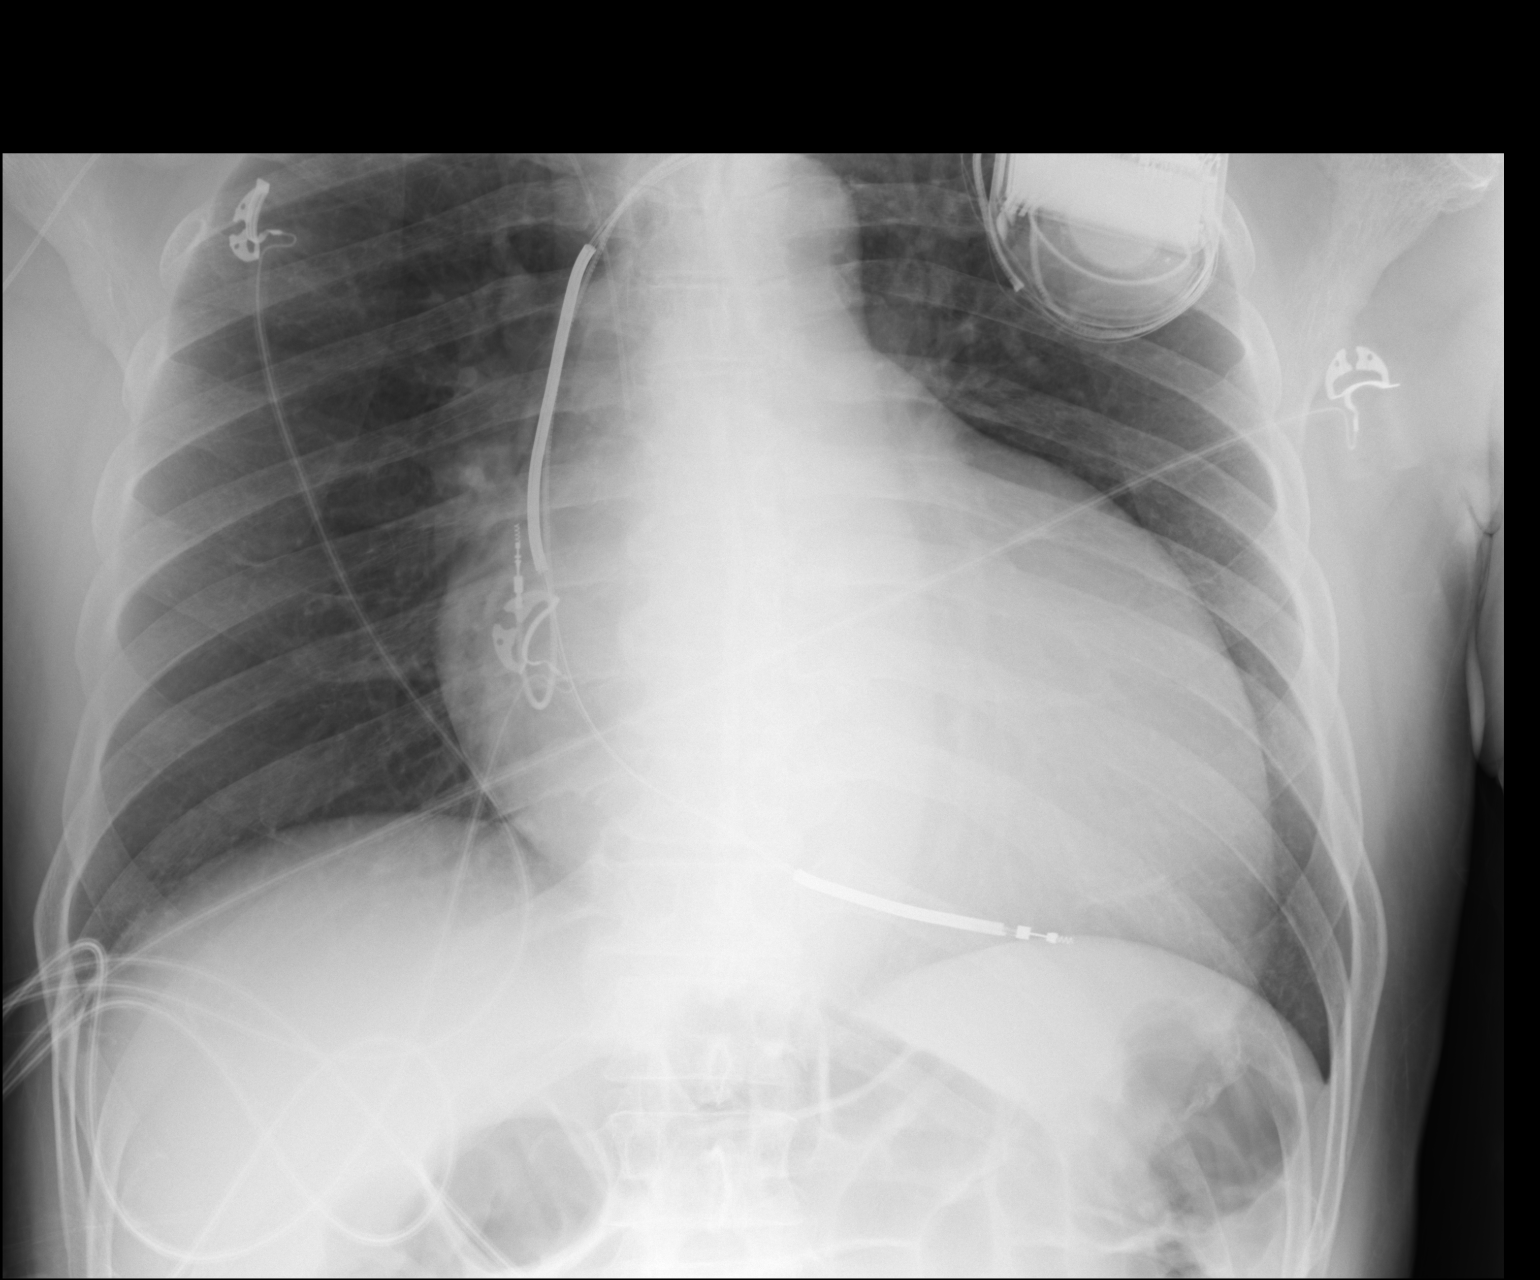

[1 of 1 positions shown; findings below may reference images not displayed]

FINDINGS: Interval placement of a right PICC line with tip in the
distal SVC.  Stable enlarged heart silhouette.  No pneumothorax.
No pulmonary edema.
IMPRESSION: Right PICC line good position.

## 2014-11-26 ENCOUNTER — Encounter (HOSPITAL_COMMUNITY): Payer: BLUE CROSS/BLUE SHIELD

## 2014-11-27 DIAGNOSIS — T829XXA Unspecified complication of cardiac and vascular prosthetic device, implant and graft, initial encounter: Secondary | ICD-10-CM | POA: Insufficient documentation

## 2014-11-27 NOTE — Progress Notes (Signed)
Patient ID: Robert [name], male   DOB: 1955-07-30, 59 y.o.   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 status post dual chamber [employer] ICD implanted April 2011. Cath 2011 showed normal coronaries. He also has a history of VTach . Currently listed as 1b list for heart tx at [facility].   Had [place] on 11/04/14 as part of dual listing process at [facility]. Normal filling pressures and cardiac output. Amio recently stopped due to thyrotoxicity. Placed on prednisone and tapazole.  He is here for unscheduled visit with driveline fault alarm. States alarm started this morning while in shower while on batteries. Placed pt on patient cable with continued driveline fault alarms. Re-set driveline fault alarm with re-occurrence of "driveline fault" within 3 minutes. Driveline with multiple tears. Pt continues to carry controller in front jean pocket against advice. Unable to produce pump stops or red heart alarms with manipulation of lead or changing of patient position.Denies dropping controller or other mechanical issues. No edema or HF symptoms.    Denies ICD shocks.Reports taking Coumadin as prescribed and adherence to anticoagulation based dietary restrictions.  Denies bright red blood per rectum or melena, no dark urine or hematuria.     Faulty controller: [id number] Flow: 5.5 Speed: 9600 PI: 5.9 Power: 6.3 Fixed speed: 9600 Low speed limit: 9000 Events: 0 - 5 PI events daily Alarms: Driveline Fault alarm starting at 10:49 am today Primary Controller: Replace back up battery in 11 months  I reviewed the LVAD parameters from today, and compared the results to the patient's prior recorded data.  No programming changes were made.  The LVAD is functioning within specified parameters.  The patient performs LVAD self-test daily.  LVAD  interrogation was negative for any significant power changes, alarms or PI events/speed drops.  LVAD equipment check completed and is in good working order.  Back-up equipment present.   LVAD education done on emergency procedures and precautions and reviewed exit site care.    Past Medical History  Diagnosis Date  . AICD (automatic cardioverter/defibrillator) present   . Hypertension   . Congestive heart failure   . Pneumonia   . Bronchitis   . Pulmonary hypertension     Current Outpatient Prescriptions  Medication Sig Dispense Refill  . allopurinol (ZYLOPRIM) 300 MG tablet Take 1 tablet (300 mg total) by mouth daily. (Patient taking differently: Take 300 mg by mouth 2 (two) times a week. ) 90 tablet 3  . amLODipine (NORVASC) 10 MG tablet Take 1 tablet (10 mg total) by mouth daily. 90 tablet 3  . aspirin EC 325 MG tablet Take 1 tablet (325 mg total) by mouth daily. 30 tablet 6  . atorvastatin (LIPITOR) 20 MG tablet Take 1 tablet (20 mg total) by mouth daily. 90 tablet 3  . doxazosin (CARDURA) 4 MG tablet Take 1.5 tablets (6 mg total) by mouth daily. (Patient taking differently: Take 4 mg by mouth daily. ) 90 tablet 4  . hydrALAZINE (APRESOLINE) 100 MG tablet Take 1 tablet (100 mg total) by mouth 2 (two) times daily. 30 tablet 6  . lisinopril (PRINIVIL,ZESTRIL) 20 MG tablet Take 2 tablets (86m)in the morning, and 1 tablet (27m in the evening. 270 tablet 3  . methimazole (TAPAZOLE) 10 MG tablet Take 1 tablet (10 mg total) by mouth 2 (two) times daily. 60Gilead  tablet 1  . mexiletine (MEXITIL) 150 MG capsule Take 1 capsule (150 mg total) by mouth 2 (two) times daily. 60 capsule 11  . Multiple Vitamins-Minerals (MULTIVITAMIN WITH MINERALS) tablet Take 1 tablet by mouth daily.    . pantoprazole (PROTONIX) 40 MG tablet Take 1 tablet (40 mg total) by mouth daily. 30 tablet 6  . predniSONE (DELTASONE) 10 MG tablet Take 1 tablet (10 mg total) by mouth daily with breakfast. 30 tablet 1  . warfarin  (COUMADIN) 5 MG tablet TAKE ONE & ONE-HALF TABLETS DAILY [place].ONLY TAKE ONE TABLET (Patient taking differently: TAKE ONE & ONE-HALF TABLETS ON MONDAY AND FRIDAY, TAKE ONE TABLET ON ALL OTHER DAYS OF THE WEEK) 60 tablet 12   No current facility-administered medications for this encounter.    Imdur and Metoprolol  REVIEW OF SYSTEMS: All systems negative except as listed in HPI, PMH and Problem list.   MAP 80 Physical Exam: GENERAL: Well appearing, male who presents to clinic today in no acute distress. HEENT: normal  NECK: Supple, JVP flat;  2+ bilaterally, no bruits. RIJ site stable after cath.  No lymphadenopathy or thyromegaly appreciated.   CARDIAC:  Mechanical heart sounds with LVAD hum present.  LUNGS:  Clear to auscultation bilaterally.  ABDOMEN:  Soft, round, nontender, positive bowel sounds x4.     LVAD exit site: well-healed and incorporated.  Dressing dry and intact.  No erythema or drainage.  Stabilization device present and accurately applied.  Driveline dressing is being changed daily per sterile technique.Driveline itself with multiple tears and twists EXTREMITIES:  Warm and dry, no cyanosis, clubbing, rash or edema  NEUROLOGIC:  Alert and oriented x 4.  Gait steady.  No aphasia.  No dysarthria.  Affect pleasant.     Recent Results (from the past 2160 hour(s))  Protime-INR     Status: Abnormal   Collection Time: 08/30/14  1:28 PM  Result Value Ref Range   INR 1.8 (H) 0.8 - 1.0 ratio   Prothrombin Time 20.0 (H) 9.6 - 13.1 sec  Protime-INR     Status: Abnormal   Collection Time: 09/05/14 11:43 AM  Result Value Ref Range   INR 2.5 (H) 0.8 - 1.0 ratio   Prothrombin Time 26.9 (H) 9.6 - 13.1 sec  Hepatic function panel     Status: Abnormal   Collection Time: 09/23/14  9:30 AM  Result Value Ref Range   Total Protein 7.2 6.5 - 8.1 g/dL   Albumin 3.8 3.5 - 5.0 g/dL   AST 21 15 - 41 U/L   ALT 18 17 - 63 U/L   Alkaline Phosphatase 88 38 - 126  U/L   Total Bilirubin 1.0 0.3 - 1.2 mg/dL   Bilirubin, Direct <0.1 (L) 0.1 - 0.5 mg/dL   Indirect Bilirubin NOT CALCULATED 0.3 - 0.9 mg/dL  Lipid Profile     Status: Abnormal   Collection Time: 09/23/14  9:42 AM  Result Value Ref Range   Cholesterol 224 (H) 0 - 200 mg/dL   Triglycerides 113 <150 mg/dL   HDL 60 >40 mg/dL   Total CHOL/HDL Ratio 3.7 RATIO   VLDL 23 0 - 40 mg/dL   LDL Cholesterol 141 (H) 0 - 99 mg/dL    Comment:        Total Cholesterol/HDL:CHD Risk Coronary Heart Disease Risk Table                     Men   Women  1/2  Average Risk   3.4   3.3  Average Risk       5.0   4.4  2 X Average Risk   9.6   7.1  3 X Average Risk  23.4   11.0        Use the calculated Patient Ratio above and the CHD Risk Table to determine the patient's CHD Risk.        ATP III CLASSIFICATION (LDL):  <100     mg/dL   Optimal  100-129  mg/dL   Near or Above                    Optimal  130-159  mg/dL   Borderline  160-189  mg/dL   High  >190     mg/dL   Very High   Brain natriuretic peptide     Status: None   Collection Time: 09/23/14  9:43 AM  Result Value Ref Range   B Natriuretic Peptide 61.6 0.0 - 100.0 pg/mL  Basic metabolic panel     Status: Abnormal   Collection Time: 09/23/14  9:44 AM  Result Value Ref Range   Sodium 139 135 - 145 mmol/L   Potassium 3.9 3.5 - 5.1 mmol/L   Chloride 105 101 - 111 mmol/L   CO2 25 22 - 32 mmol/L   Glucose, Bld 98 65 - 99 mg/dL   BUN 15 6 - 20 mg/dL   Creatinine, Ser 1.18 0.61 - 1.24 mg/dL   Calcium 8.8 (L) 8.9 - 10.3 mg/dL   GFR calc non Af Amer >60 >60 mL/min   GFR calc Af Amer >60 >60 mL/min    Comment: (NOTE) The eGFR has been calculated using the CKD EPI equation. This calculation has not been validated in all clinical situations. eGFR's persistently <60 mL/min signify possible Chronic Kidney Disease.    Anion gap 9 5 - 15  CBC     Status: None   Collection Time: 09/23/14  9:44 AM  Result Value Ref Range   WBC 7.0 4.0 - 10.5 K/uL    RBC 4.85 4.22 - 5.81 MIL/uL   Hemoglobin 14.0 13.0 - 17.0 g/dL   HCT 41.7 39.0 - 52.0 %   MCV 86.0 78.0 - 100.0 fL   MCH 28.9 26.0 - 34.0 pg   MCHC 33.6 30.0 - 36.0 g/dL   RDW 14.3 11.5 - 15.5 %   Platelets 168 150 - 400 K/uL  Protime-INR     Status: Abnormal   Collection Time: 09/23/14  9:44 AM  Result Value Ref Range   Prothrombin Time 26.0 (H) 11.6 - 15.2 seconds   INR 2.42 (H) 0.00 - 1.49  Lactate dehydrogenase     Status: Abnormal   Collection Time: 09/23/14  9:44 AM  Result Value Ref Range   LDH 219 (H) 98 - 192 U/L  TSH     Status: Abnormal   Collection Time: 10/15/14  1:21 PM  Result Value Ref Range   TSH 0.08 (L) 0.35 - 4.50 uIU/mL  Protime-INR     Status: Abnormal   Collection Time: 10/15/14  1:21 PM  Result Value Ref Range   INR 3.6 (H) 0.8 - 1.0 ratio   Prothrombin Time 38.9 (H) 9.6 - 13.1 sec  ABO/Rh     Status: None   Collection Time: 10/23/14 11:02 AM  Result Value Ref Range   ABO/RH(D) O POS   TSH     Status: Abnormal   Collection Time: 10/23/14  11:15 AM  Result Value Ref Range   TSH 0.029 (L) 0.350 - 4.500 uIU/mL  T4, free     Status: Abnormal   Collection Time: 10/23/14 11:15 AM  Result Value Ref Range   Free T4 4.61 (H) 0.61 - 1.12 ng/dL  T3, free     Status: Abnormal   Collection Time: 10/23/14 11:15 AM  Result Value Ref Range   T3, Free 8.3 (H) 2.0 - 4.4 pg/mL    Comment: (NOTE) Performed At: [facility] [place], [place] [id number] Lindon Romp MD [id number]   INR/PT     Status: Abnormal   Collection Time: 10/23/14 11:15 AM  Result Value Ref Range   Prothrombin Time 20.1 (H) 11.6 - 15.2 seconds   INR 1.72 (H) 0.00 - 1.49  INR/PT     Status: Abnormal   Collection Time: 10/30/14 12:30 PM  Result Value Ref Range   Prothrombin Time 32.3 (H) 11.6 - 15.2 seconds   INR 3.22 (H) 0.00 - 1.49  CBC     Status: Abnormal   Collection Time: 11/04/14  6:38 AM  Result Value Ref Range   WBC 6.5 4.0 - 10.5 K/uL   RBC  4.50 4.22 - 5.81 MIL/uL   Hemoglobin 12.7 (L) 13.0 - 17.0 g/dL   HCT 38.2 (L) 39.0 - 52.0 %   MCV 84.9 78.0 - 100.0 fL   MCH 28.2 26.0 - 34.0 pg   MCHC 33.2 30.0 - 36.0 g/dL   RDW 13.0 11.5 - 15.5 %   Platelets 150 150 - 400 K/uL  Basic metabolic panel     Status: Abnormal   Collection Time: 11/04/14  6:38 AM  Result Value Ref Range   Sodium 139 135 - 145 mmol/L   Potassium 3.6 3.5 - 5.1 mmol/L   Chloride 107 101 - 111 mmol/L   CO2 25 22 - 32 mmol/L   Glucose, Bld 97 65 - 99 mg/dL   BUN 15 6 - 20 mg/dL   Creatinine, Ser 1.17 0.61 - 1.24 mg/dL   Calcium 8.6 (L) 8.9 - 10.3 mg/dL   GFR calc non Af Amer >60 >60 mL/min   GFR calc Af Amer >60 >60 mL/min    Comment: (NOTE) The eGFR has been calculated using the CKD EPI equation. This calculation has not been validated in all clinical situations. eGFR's persistently <60 mL/min signify possible Chronic Kidney Disease.    Anion gap 7 5 - 15  Protime-INR     Status: Abnormal   Collection Time: 11/04/14  6:38 AM  Result Value Ref Range   Prothrombin Time 31.6 (H) 11.6 - 15.2 seconds   INR 3.12 (H) 0.00 - 1.49  I-STAT 3, venous blood gas (G3P V)     Status: Abnormal   Collection Time: 11/04/14  9:10 AM  Result Value Ref Range   pH, Ven 7.387 (H) 7.250 - 7.300   pCO2, Ven 42.4 (L) 45.0 - 50.0 mmHg   pO2, Ven 38.0 30.0 - 45.0 mmHg   Bicarbonate 25.5 (H) 20.0 - 24.0 mEq/L   TCO2 27 0 - 100 mmol/L   O2 Saturation 72.0 %   Sample type VENOUS    Comment NOTIFIED PHYSICIAN   I-STAT 3, venous blood gas (G3P V)     Status: Abnormal   Collection Time: 11/04/14  9:10 AM  Result Value Ref Range   pH, Ven 7.404 (H) 7.250 - 7.300   pCO2, Ven 38.9 (L) 45.0 - 50.0 mmHg  pO2, Ven 40.0 30.0 - 45.0 mmHg   Bicarbonate 24.3 (H) 20.0 - 24.0 mEq/L   TCO2 26 0 - 100 mmol/L   O2 Saturation 75.0 %   Sample type VENOUS   Lactate dehydrogenase     Status: Abnormal   Collection Time: 11/04/14 10:49 AM  Result Value Ref Range   LDH 228 (H) 98 - 192  U/L  INR/PT     Status: Abnormal   Collection Time: 11/11/14  2:00 PM  Result Value Ref Range   Prothrombin Time 26.4 (H) 11.6 - 15.2 seconds   INR 2.47 (H) 0.00 - 1.49  HgB A1c     Status: None   Collection Time: 11/13/14 11:00 AM  Result Value Ref Range   Hgb A1c MFr Bld 5.6 4.8 - 5.6 %    Comment: (NOTE)         Pre-diabetes: 5.7 - 6.4         Diabetes: >6.4         Glycemic control for adults with diabetes: <7.0    Mean Plasma Glucose 114 mg/dL    Comment: (NOTE) Performed At: [facility] [address] [place], [place] [id number] Lindon Romp MD CW:[id number]   Hepatitis B Core Antibody, total     Status: None   Collection Time: 11/13/14 11:00 AM  Result Value Ref Range   Hep B Core Total Ab Negative Negative    Comment: (NOTE) Performed At: [facility] [place], [place] [id number] Lindon Romp MD [id number]   Hepatitis B Surface AntiBODY     Status: None   Collection Time: 11/13/14 11:00 AM  Result Value Ref Range   Hep B S Ab Reactive     Comment: (NOTE)              Non Reactive: Inconsistent with immunity,                            less than 10 mIU/mL              Reactive:     Consistent with immunity,                            greater than 9.9 mIU/mL Performed At: [facility] [place], [place] [id number] Lindon Romp MD [id number]   Hepatitis B Surface AntiGEN     Status: None   Collection Time: 11/13/14 11:00 AM  Result Value Ref Range   Hepatitis B Surface Ag Negative Negative    Comment: (NOTE) Performed At: [facility] [address] [place], [place] [id number] Lindon Romp MD WC:[id number]   HIV antibody (with reflex)     Status: None   Collection Time: 11/13/14 11:00 AM  Result Value Ref Range   HIV Screen 4th Generation wRfx Non Reactive Non Reactive    Comment: (NOTE) Performed At: [facility] [place], [place] [id number] Lindon Romp MD [id number]   Hepatitis C Antibody     Status: None   Collection Time: 11/13/14 11:00 AM  Result Value Ref Range   HCV Ab <0.1 0.0 - 0.9 s/co ratio    Comment: (NOTE)                                  Negative:     <  0.8                             Indeterminate: 0.8 - 0.9                                  Positive:     > 0.9 The CDC recommends that a positive HCV antibody result be followed up with a HCV Nucleic Acid Amplification test (379024). Performed At: [facility] [address] [place], [place] [id number] Lindon Romp MD ME:[id number]   Hepatitis A Antibody, IGM     Status: None   Collection Time: 11/13/14 11:00 AM  Result Value Ref Range   Hep A IgM Negative Negative    Comment: (NOTE) Performed At: [facility] [place], [place] [id number] Lindon Romp MD [id number]   RPR     Status: None   Collection Time: 11/13/14 11:00 AM  Result Value Ref Range   RPR Ser Ql Non Reactive Non Reactive    Comment: (NOTE) Performed At: [facility] [address] [place], [place] [id number] Lindon Romp MD [id number]   Epstein-Barr virus VCA, IgG     Status: None   Collection Time: 11/13/14 11:00 AM  Result Value Ref Range   EBV VCA IgG <18.0 0.0 - 17.9 U/mL    Comment: (NOTE)                                 Negative        <18.0                                 Equivocal 18.0 - 21.9                                 Positive        >21.9 Performed At: [facility] [address] [place], [place] [id number] Lindon Romp MD EH:[id number]   Epstein-Barr virus VCA, IgM     Status: None   Collection Time: 11/13/14 11:00 AM  Result Value Ref Range   EBV VCA IgM <36.0 0.0 - 35.9 U/mL    Comment: (NOTE)                                 Negative        <36.0                                 Equivocal 36.0 - 43.9                                 Positive        >43.9 Performed At: [facility] [place], [place] [id number] Lindon Romp MD YY:[id number]   CMV IgM     Status: None   Collection Time: 11/13/14 11:00 AM  Result Value Ref Range   CMV IgM <30.0 0.0 - 29.9 AU/mL    Comment: (NOTE)  Negative         <30.0                                Equivocal  30.0 - 34.9                                Positive         >34.9 A positive result is generally indicative of acute infection, reactivation or persistent IgM production. Performed At: [facility] [place], [place] [id number] Lindon Romp MD OM:[id number]   Cmv antibody, IgG (EIA)     Status: Abnormal   Collection Time: 11/13/14 11:00 AM  Result Value Ref Range   CMV Ab - IgG 4.90 (H) 0.00 - 0.59 U/mL    Comment: (NOTE)                               Negative          <0.60                               Equivocal   0.60 - 0.69                               Positive          >0.69 Performed At: [facility] [place], [place] [id number] Lindon Romp MD [id number]   Varicella zoster antibody, IgG     Status: None   Collection Time: 11/13/14 11:00 AM  Result Value Ref Range   Varicella IgG 1851 Immune >165 index    Comment: (NOTE)                               Negative          <135                               Equivocal    135 - 165                               Positive          >165 A positive result generally indicates exposure to the pathogen or administration of specific immunoglobulins, but it is not indication of active infection or stage of disease. Performed At: [facility] [place], [place] [id number] Lindon Romp MD IH:[id number]   HSV 1 AND 2 IGM ABS, INDIRECT     Status: None   Collection Time: 11/13/14 11:00 AM  Result Value Ref Range   HSV 1 IgM Antibodies <1:10 <1:10 titer   HSV 2 IgM Antibodies <1:10 <1:10 titer    Comment: (NOTE) HSV 1 and HSV 2 share  many cross-reacting antigens. Elevated titers to both HSV 1 and HSV 2 may represent crossreactive HSV antibodies rather than exposure to both HSV 1 and HSV 2. Results for this test are for research purposes only by the assay's manufacturer. The performance characteristics of this product have not been established.  Results should  not be used as a diagnostic procedure without confirmation of the diagnosis by another medically established diagnostic product or procedure. Performed At: [facility] [address] [place], [place] [id number] Lindon Romp MD WV:[id number]   Toxoplasma gondii antibody, IgM     Status: None   Collection Time: 11/13/14 11:00 AM  Result Value Ref Range   Toxoplasma Antibody- IgM <3.0 0.0 - 7.9 AU/mL    Comment: (NOTE)                             Negative            <8.0                             Equivocal      8.0 - 9.9                             Positive            >9.9 Performed At: [facility] [place], [place] [id number] Lindon Romp MD KX:[id number]   Comment     Status: None   Collection Time: 11/13/14 11:00 AM  Result Value Ref Range   Comment Comment     Comment: (NOTE) It is presumed the patient has not been infected with and is not undergoing an acute infection with Toxoplasma. If symptoms persist, submit a new specimen after three weeks. Performed At: [facility] [place], [place] [id number] Lindon Romp MD OF:[id number]   Nicotine screen, urine     Status: None   Collection Time: 11/13/14 12:00 PM  Result Value Ref Range   Nicotine, urine < 2 ng/mL    Comment: (NOTE)  REFERENCE RANGES FOR NICOTINE:      Smokers: 200-700 ng/mL      Nonsmokers: <=17 ng/mL    Cotinine, Urine < 2 ng/mL    Comment: (NOTE)  REFERENCE RANGES FOR COTININE:      Smokers: [phone] ng/mL      Nonsmokers: <=20 ng/mL Performed at [facility]   Urine rapid drug screen (hosp  performed)     Status: None   Collection Time: 11/15/14  3:29 PM  Result Value Ref Range   Opiates NONE DETECTED NONE DETECTED   Cocaine NONE DETECTED NONE DETECTED   Benzodiazepines NONE DETECTED NONE DETECTED   Amphetamines NONE DETECTED NONE DETECTED   Tetrahydrocannabinol NONE DETECTED NONE DETECTED   Barbiturates NONE DETECTED NONE DETECTED    Comment:        DRUG SCREEN FOR MEDICAL PURPOSES ONLY.  IF CONFIRMATION IS NEEDED FOR ANY PURPOSE, NOTIFY LAB WITHIN 5 DAYS.        LOWEST DETECTABLE LIMITS FOR URINE DRUG SCREEN Drug Class       Cutoff (ng/mL) Amphetamine      1000 Barbiturate      200 Benzodiazepine   782 Tricyclics       423 Opiates          300 Cocaine          300 THC              50   Ethanol     Status: None   Collection Time: 11/18/14  3:00 PM  Result Value Ref Range   Alcohol, Ethyl (B) <5 <5 mg/dL  Comment:        LOWEST DETECTABLE LIMIT FOR SERUM ALCOHOL IS 5 mg/dL FOR MEDICAL PURPOSES ONLY   INR/PT     Status: Abnormal   Collection Time: 11/18/14  3:00 PM  Result Value Ref Range   Prothrombin Time 33.8 (H) 11.6 - 15.2 seconds   INR 3.43 (H) 0.00 - 1.49  Magnesium     Status: None   Collection Time: 11/18/14  3:00 PM  Result Value Ref Range   Magnesium 2.0 1.7 - 2.4 mg/dL  Basic metabolic panel     Status: Abnormal   Collection Time: 11/18/14  3:12 PM  Result Value Ref Range   Sodium 139 135 - 145 mmol/L   Potassium 4.1 3.5 - 5.1 mmol/L   Chloride 106 101 - 111 mmol/L   CO2 23 22 - 32 mmol/L   Glucose, Bld 117 (H) 65 - 99 mg/dL   BUN 15 6 - 20 mg/dL   Creatinine, Ser 1.47 (H) 0.61 - 1.24 mg/dL   Calcium 9.0 8.9 - 10.3 mg/dL   GFR calc non Af Amer 50 (L) >60 mL/min   GFR calc Af Amer 59 (L) >60 mL/min    Comment: (NOTE) The eGFR has been calculated using the CKD EPI equation. This calculation has not been validated in all clinical situations. eGFR's persistently <60 mL/min signify possible Chronic Kidney Disease.    Anion gap 10 5  - 15    ASSESSMENT AND PLAN:   1) Chronic systolic HF, s/p HMII LVAD implant 05/2012. Currently 1B at [facility].  - He is improved with VAD speed down to 9600.  - Now being dual listed at [facility] and [facility]. RHC looks great - He is not on a BB. He was able to tolerate Toprol before LVAD, however while in the hospital with implant over a year ago had syncope on higher dose.  - Continue lisinopril, norvasc, doxazosin, at current doses. Resume hydralazine at 50 bid,  Intolerant of Imdur d/t headaches.  - Reinforced the need and importance of daily weights, a low sodium diet, and fluid restriction (less than 2 L a day). Instructed to call the HF clinic if weight increases more than 3 lbs overnight or 5 lbs in a week.  2)  HTN-  - MAP improved. 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 Forde Dandy. 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 Amio stopped earlier this week. Started tapazole and prednisone. He has f/u with Dr. Forde Dandy.  5) NSVT/VT  - Stopped amio as above. ICD interrogated personally. No VT or AF. Volume status ok. Activity level increasing.  6) Anticoagulation with coumadin  - Continue coumadin and ASA 325 mg for LVAD. Check INR and adjust coumadin accordingly per HF pharmacy.   7) LVAD  - Improved on 9600. Continue transplant process. VAD controller changed in clinic due to driveline fault alarm.    Glori Bickers MD  11:28 PM

## 2014-11-27 NOTE — Progress Notes (Signed)
Patient ID: Robert [name], male   DOB: Aug 08, 1955, 59 y.o.   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 status post dual chamber Medtronic ICD implanted April 2011. Cath 2011 showed normal coronaries. He also has a history of VTach . Currently listed as 1b list for heart tx at [facility].   Had [place] on 11/04/14 as part of dual listing process at [facility]. Normal filling pressures and cardiac output. Amio recently stopped due to thyrotoxicity. Placed on prednisone and tapazole.  He is here for unscheduled visit for palpitations and increased fatigue. Last seen in clinic on 8/4 with driveline fault alarm. Controller replaced. Denies CP, SOB, ICD shock or syncope. Compliant with meds.   Reports taking Coumadin as prescribed and adherence to anticoagulation based dietary restrictions.  Denies bright red blood per rectum or melena, no dark urine or hematuria.    Device interrogated personally with Medtr[employer] rep: Having very frequent short runs of VT. Rates all around 140 and are 5 - 6 seconds in duration. High PVC burden. Therapies are ON at this point for VT > 162 and VF > 210.   LVAD Interrogation: Flow: 6.3 Speed: 9600 Power: 6.7 PI: 4.5 Alarms: few low voltage advisories Events: 2 - 3 most days during the week   Set Speed: 9600 Back up Speed: 9000  Back up LVAD equipment present during clinic visit today. LVAD interrogation negative for any significant power elevations or off baseline PI events (normally has 0 - 5 a few days during the week).   Encounter Details:    I reviewed the LVAD parameters from today, and compared the results to the patient's prior recorded data.  No programming changes were made.  The LVAD is functioning within specified parameters.  The patient performs LVAD self-test daily.  LVAD interrogation was negative for any significant  power changes, alarms or PI events/speed drops.  LVAD equipment check completed and is in good working order.  Back-up equipment present.   LVAD education done on emergency procedures and precautions and reviewed exit site care.    Past Medical History  Diagnosis Date  . AICD (automatic cardioverter/defibrillator) present   . Hypertension   . Congestive heart failure   . Pneumonia   . Bronchitis   . Pulmonary hypertension     Current Outpatient Prescriptions  Medication Sig Dispense Refill  . allopurinol (ZYLOPRIM) 300 MG tablet Take 1 tablet (300 mg total) by mouth daily. (Patient taking differently: Take 300 mg by mouth 2 (two) times a week. ) 90 tablet 3  . amLODipine (NORVASC) 10 MG tablet Take 1 tablet (10 mg total) by mouth daily. 90 tablet 3  . aspirin EC 325 MG tablet Take 1 tablet (325 mg total) by mouth daily. 30 tablet 6  . doxazosin (CARDURA) 4 MG tablet Take 1.5 tablets (6 mg total) by mouth daily. (Patient taking differently: Take 4 mg by mouth daily. ) 90 tablet 4  . hydrALAZINE (APRESOLINE) 100 MG tablet Take 1 tablet (100 mg total) by mouth 2 (two) times daily. 30 tablet 6  . lisinopril (PRINIVIL,ZESTRIL) 20 MG tablet Take 2 tablets (30m)in the morning, and 1 tablet (240m in the evening. 270 tablet 3  . methimazole (TAPAZOLE) 10 MG tablet Take 1 tablet (10 mg total) by mouth 2 (two) times daily. 60 tablet 1  . Multiple  Vitamins-Minerals (MULTIVITAMIN WITH MINERALS) tablet Take 1 tablet by mouth daily.    . pantoprazole (PROTONIX) 40 MG tablet Take 1 tablet (40 mg total) by mouth daily. 30 tablet 6  . predniSONE (DELTASONE) 10 MG tablet Take 1 tablet (10 mg total) by mouth daily with breakfast. 30 tablet 1  . warfarin (COUMADIN) 5 MG tablet TAKE ONE & ONE-HALF TABLETS DAILY [place].ONLY TAKE ONE TABLET (Patient taking differently: TAKE ONE & ONE-HALF TABLETS ON MONDAY AND FRIDAY, TAKE ONE TABLET ON ALL OTHER DAYS OF THE WEEK) 60 tablet 12  .  atorvastatin (LIPITOR) 20 MG tablet Take 1 tablet (20 mg total) by mouth daily. 90 tablet 3  . mexiletine (MEXITIL) 150 MG capsule Take 1 capsule (150 mg total) by mouth 2 (two) times daily. 60 capsule 11   No current facility-administered medications for this encounter.    Imdur and Metoprolol  REVIEW OF SYSTEMS: All systems negative except as listed in HPI, PMH and Problem list.   MAP 80 Physical Exam: GENERAL: Well appearing, male who presents to clinic today in no acute distress. HEENT: normal  NECK: Supple, JVP flat;  2+ bilaterally, no bruits. RIJ site stable after cath.  No lymphadenopathy or thyromegaly appreciated.   CARDIAC:  Mechanical heart sounds with LVAD hum present.  LUNGS:  Clear to auscultation bilaterally.  ABDOMEN:  Soft, round, nontender, positive bowel sounds x4.     LVAD exit site: well-healed and incorporated.  Dressing dry and intact.  No erythema or drainage.  Stabilization device present and accurately applied.  Driveline dressing is being changed daily per sterile technique.Driveline itself with multiple tears and twists EXTREMITIES:  Warm and dry, no cyanosis, clubbing, rash or edema  NEUROLOGIC:  Alert and oriented x 4.  Gait steady.  No aphasia.  No dysarthria.  Affect pleasant.     Recent Results (from the past 2160 hour(s))  Protime-INR     Status: Abnormal   Collection Time: 08/30/14  1:28 PM  Result Value Ref Range   INR 1.8 (H) 0.8 - 1.0 ratio   Prothrombin Time 20.0 (H) 9.6 - 13.1 sec  Protime-INR     Status: Abnormal   Collection Time: 09/05/14 11:43 AM  Result Value Ref Range   INR 2.5 (H) 0.8 - 1.0 ratio   Prothrombin Time 26.9 (H) 9.6 - 13.1 sec  Hepatic function panel     Status: Abnormal   Collection Time: 09/23/14  9:30 AM  Result Value Ref Range   Total Protein 7.2 6.5 - 8.1 g/dL   Albumin 3.8 3.5 - 5.0 g/dL   AST 21 15 - 41 U/L   ALT 18 17 - 63 U/L   Alkaline Phosphatase 88 38 - 126 U/L   Total Bilirubin 1.0 0.3 - 1.2 mg/dL    Bilirubin, Direct <0.1 (L) 0.1 - 0.5 mg/dL   Indirect Bilirubin NOT CALCULATED 0.3 - 0.9 mg/dL  Lipid Profile     Status: Abnormal   Collection Time: 09/23/14  9:42 AM  Result Value Ref Range   Cholesterol 224 (H) 0 - 200 mg/dL   Triglycerides 113 <150 mg/dL   HDL 60 >40 mg/dL   Total CHOL/HDL Ratio 3.7 RATIO   VLDL 23 0 - 40 mg/dL   LDL Cholesterol 141 (H) 0 - 99 mg/dL    Comment:        Total Cholesterol/HDL:CHD Risk Coronary Heart Disease Risk Table  Men   Women  1/2 Average Risk   3.4   3.3  Average Risk       5.0   4.4  2 X Average Risk   9.6   7.1  3 X Average Risk  23.4   11.0        Use the calculated Patient Ratio above and the CHD Risk Table to determine the patient's CHD Risk.        ATP III CLASSIFICATION (LDL):  <100     mg/dL   Optimal  100-129  mg/dL   Near or Above                    Optimal  130-159  mg/dL   Borderline  160-189  mg/dL   High  >190     mg/dL   Very High   Brain natriuretic peptide     Status: None   Collection Time: 09/23/14  9:43 AM  Result Value Ref Range   B Natriuretic Peptide 61.6 0.0 - 100.0 pg/mL  Basic metabolic panel     Status: Abnormal   Collection Time: 09/23/14  9:44 AM  Result Value Ref Range   Sodium 139 135 - 145 mmol/L   Potassium 3.9 3.5 - 5.1 mmol/L   Chloride 105 101 - 111 mmol/L   CO2 25 22 - 32 mmol/L   Glucose, Bld 98 65 - 99 mg/dL   BUN 15 6 - 20 mg/dL   Creatinine, Ser 1.18 0.61 - 1.24 mg/dL   Calcium 8.8 (L) 8.9 - 10.3 mg/dL   GFR calc non Af Amer >60 >60 mL/min   GFR calc Af Amer >60 >60 mL/min    Comment: (NOTE) The eGFR has been calculated using the CKD EPI equation. This calculation has not been validated in all clinical situations. eGFR's persistently <60 mL/min signify possible Chronic Kidney Disease.    Anion gap 9 5 - 15  CBC     Status: None   Collection Time: 09/23/14  9:44 AM  Result Value Ref Range   WBC 7.0 4.0 - 10.5 K/uL   RBC 4.85 4.22 - 5.81 MIL/uL   Hemoglobin  14.0 13.0 - 17.0 g/dL   HCT 41.7 39.0 - 52.0 %   MCV 86.0 78.0 - 100.0 fL   MCH 28.9 26.0 - 34.0 pg   MCHC 33.6 30.0 - 36.0 g/dL   RDW 14.3 11.5 - 15.5 %   Platelets 168 150 - 400 K/uL  Protime-INR     Status: Abnormal   Collection Time: 09/23/14  9:44 AM  Result Value Ref Range   Prothrombin Time 26.0 (H) 11.6 - 15.2 seconds   INR 2.42 (H) 0.00 - 1.49  Lactate dehydrogenase     Status: Abnormal   Collection Time: 09/23/14  9:44 AM  Result Value Ref Range   LDH 219 (H) 98 - 192 U/L  TSH     Status: Abnormal   Collection Time: 10/15/14  1:21 PM  Result Value Ref Range   TSH 0.08 (L) 0.35 - 4.50 uIU/mL  Protime-INR     Status: Abnormal   Collection Time: 10/15/14  1:21 PM  Result Value Ref Range   INR 3.6 (H) 0.8 - 1.0 ratio   Prothrombin Time 38.9 (H) 9.6 - 13.1 sec  ABO/Rh     Status: None   Collection Time: 10/23/14 11:02 AM  Result Value Ref Range   ABO/RH(D) O POS   TSH     Status:  Abnormal   Collection Time: 10/23/14 11:15 AM  Result Value Ref Range   TSH 0.029 (L) 0.350 - 4.500 uIU/mL  T4, free     Status: Abnormal   Collection Time: 10/23/14 11:15 AM  Result Value Ref Range   Free T4 4.61 (H) 0.61 - 1.12 ng/dL  T3, free     Status: Abnormal   Collection Time: 10/23/14 11:15 AM  Result Value Ref Range   T3, Free 8.3 (H) 2.0 - 4.4 pg/mL    Comment: (NOTE) Performed At: [facility] [place], [place] [id number] Lindon Romp MD HX:[id number]   INR/PT     Status: Abnormal   Collection Time: 10/23/14 11:15 AM  Result Value Ref Range   Prothrombin Time 20.1 (H) 11.6 - 15.2 seconds   INR 1.72 (H) 0.00 - 1.49  INR/PT     Status: Abnormal   Collection Time: 10/30/14 12:30 PM  Result Value Ref Range   Prothrombin Time 32.3 (H) 11.6 - 15.2 seconds   INR 3.22 (H) 0.00 - 1.49  CBC     Status: Abnormal   Collection Time: 11/04/14  6:38 AM  Result Value Ref Range   WBC 6.5 4.0 - 10.5 K/uL   RBC 4.50 4.22 - 5.81 MIL/uL   Hemoglobin 12.7 (L)  13.0 - 17.0 g/dL   HCT 38.2 (L) 39.0 - 52.0 %   MCV 84.9 78.0 - 100.0 fL   MCH 28.2 26.0 - 34.0 pg   MCHC 33.2 30.0 - 36.0 g/dL   RDW 13.0 11.5 - 15.5 %   Platelets 150 150 - 400 K/uL  Basic metabolic panel     Status: Abnormal   Collection Time: 11/04/14  6:38 AM  Result Value Ref Range   Sodium 139 135 - 145 mmol/L   Potassium 3.6 3.5 - 5.1 mmol/L   Chloride 107 101 - 111 mmol/L   CO2 25 22 - 32 mmol/L   Glucose, Bld 97 65 - 99 mg/dL   BUN 15 6 - 20 mg/dL   Creatinine, Ser 1.17 0.61 - 1.24 mg/dL   Calcium 8.6 (L) 8.9 - 10.3 mg/dL   GFR calc non Af Amer >60 >60 mL/min   GFR calc Af Amer >60 >60 mL/min    Comment: (NOTE) The eGFR has been calculated using the CKD EPI equation. This calculation has not been validated in all clinical situations. eGFR's persistently <60 mL/min signify possible Chronic Kidney Disease.    Anion gap 7 5 - 15  Protime-INR     Status: Abnormal   Collection Time: 11/04/14  6:38 AM  Result Value Ref Range   Prothrombin Time 31.6 (H) 11.6 - 15.2 seconds   INR 3.12 (H) 0.00 - 1.49  I-STAT 3, venous blood gas (G3P V)     Status: Abnormal   Collection Time: 11/04/14  9:10 AM  Result Value Ref Range   pH, Ven 7.387 (H) 7.250 - 7.300   pCO2, Ven 42.4 (L) 45.0 - 50.0 mmHg   pO2, Ven 38.0 30.0 - 45.0 mmHg   Bicarbonate 25.5 (H) 20.0 - 24.0 mEq/L   TCO2 27 0 - 100 mmol/L   O2 Saturation 72.0 %   Sample type VENOUS    Comment NOTIFIED PHYSICIAN   I-STAT 3, venous blood gas (G3P V)     Status: Abnormal   Collection Time: 11/04/14  9:10 AM  Result Value Ref Range   pH, Ven 7.404 (H) 7.250 - 7.300   pCO2, Ven 38.9 (  L) 45.0 - 50.0 mmHg   pO2, Ven 40.0 30.0 - 45.0 mmHg   Bicarbonate 24.3 (H) 20.0 - 24.0 mEq/L   TCO2 26 0 - 100 mmol/L   O2 Saturation 75.0 %   Sample type VENOUS   Lactate dehydrogenase     Status: Abnormal   Collection Time: 11/04/14 10:49 AM  Result Value Ref Range   LDH 228 (H) 98 - 192 U/L  INR/PT     Status: Abnormal   Collection  Time: 11/11/14  2:00 PM  Result Value Ref Range   Prothrombin Time 26.4 (H) 11.6 - 15.2 seconds   INR 2.47 (H) 0.00 - 1.49  HgB A1c     Status: None   Collection Time: 11/13/14 11:00 AM  Result Value Ref Range   Hgb A1c MFr Bld 5.6 4.8 - 5.6 %    Comment: (NOTE)         Pre-diabetes: 5.7 - 6.4         Diabetes: >6.4         Glycemic control for adults with diabetes: <7.0    Mean Plasma Glucose 114 mg/dL    Comment: (NOTE) Performed At: [facility] [address] [place], [place] [id number] Lindon Romp MD [id number]   Hepatitis B Core Antibody, total     Status: None   Collection Time: 11/13/14 11:00 AM  Result Value Ref Range   Hep B Core Total Ab Negative Negative    Comment: (NOTE) Performed At: [facility] [place], [place] [id number] Lindon Romp MD IB:[id number]   Hepatitis B Surface AntiBODY     Status: None   Collection Time: 11/13/14 11:00 AM  Result Value Ref Range   Hep B S Ab Reactive     Comment: (NOTE)              Non Reactive: Inconsistent with immunity,                            less than 10 mIU/mL              Reactive:     Consistent with immunity,                            greater than 9.9 mIU/mL Performed At: [facility] [place], [place] [id number] Lindon Romp MD [id number]   Hepatitis B Surface AntiGEN     Status: None   Collection Time: 11/13/14 11:00 AM  Result Value Ref Range   Hepatitis B Surface Ag Negative Negative    Comment: (NOTE) Performed At: [facility] [address] [place], [place] [id number] Lindon Romp MD [id number]   HIV antibody (with reflex)     Status: None   Collection Time: 11/13/14 11:00 AM  Result Value Ref Range   HIV Screen 4th Generation wRfx Non Reactive Non Reactive    Comment: (NOTE) Performed At: [facility] [place], [place] [id number] Lindon Romp MD OF:[id number]   Hepatitis C  Antibody     Status: None   Collection Time: 11/13/14 11:00 AM  Result Value Ref Range   HCV Ab <0.1 0.0 - 0.9 s/co ratio    Comment: (NOTE)  Negative:     < 0.8                             Indeterminate: 0.8 - 0.9                                  Positive:     > 0.9 The CDC recommends that a positive HCV antibody result be followed up with a HCV Nucleic Acid Amplification test (161096). Performed At: [facility] [address] [place], [place] [id number] Lindon Romp MD BJ:[id number]   Hepatitis A Antibody, IGM     Status: None   Collection Time: 11/13/14 11:00 AM  Result Value Ref Range   Hep A IgM Negative Negative    Comment: (NOTE) Performed At: [facility] [place], [place] [id number] Lindon Romp MD [id number]   RPR     Status: None   Collection Time: 11/13/14 11:00 AM  Result Value Ref Range   RPR Ser Ql Non Reactive Non Reactive    Comment: (NOTE) Performed At: [facility] [address] [place], [place] [id number] Lindon Romp MD UY:[id number]   Epstein-Barr virus VCA, IgG     Status: None   Collection Time: 11/13/14 11:00 AM  Result Value Ref Range   EBV VCA IgG <18.0 0.0 - 17.9 U/mL    Comment: (NOTE)                                 Negative        <18.0                                 Equivocal 18.0 - 21.9                                 Positive        >21.9 Performed At: [facility] [address] [place], [place] [id number] Lindon Romp MD IR:[id number]   Epstein-Barr virus VCA, IgM     Status: None   Collection Time: 11/13/14 11:00 AM  Result Value Ref Range   EBV VCA IgM <36.0 0.0 - 35.9 U/mL    Comment: (NOTE)                                 Negative        <36.0                                 Equivocal 36.0 - 43.9                                 Positive        >43.9 Performed At: [facility] [place], [place]  [id number] Lindon Romp MD AT:[id number]   CMV IgM     Status: None   Collection Time: 11/13/14 11:00 AM  Result Value Ref Range   CMV IgM <30.0 0.0 - 29.9 AU/mL    Comment: (  NOTE)                                Negative         <30.0                                Equivocal  30.0 - 34.9                                Positive         >34.9 A positive result is generally indicative of acute infection, reactivation or persistent IgM production. Performed At: [facility] [place], [place] [id number] Lindon Romp MD XB:[id number]   Cmv antibody, IgG (EIA)     Status: Abnormal   Collection Time: 11/13/14 11:00 AM  Result Value Ref Range   CMV Ab - IgG 4.90 (H) 0.00 - 0.59 U/mL    Comment: (NOTE)                               Negative          <0.60                               Equivocal   0.60 - 0.69                               Positive          >0.69 Performed At: [facility] [place], [place] [id number] Lindon Romp MD [id number]   Varicella zoster antibody, IgG     Status: None   Collection Time: 11/13/14 11:00 AM  Result Value Ref Range   Varicella IgG 1851 Immune >165 index    Comment: (NOTE)                               Negative          <135                               Equivocal    135 - 165                               Positive          >165 A positive result generally indicates exposure to the pathogen or administration of specific immunoglobulins, but it is not indication of active infection or stage of disease. Performed At: [facility] [place], [place] [id number] Lindon Romp MD [id number]   HSV 1 AND 2 IGM ABS, INDIRECT     Status: None   Collection Time: 11/13/14 11:00 AM  Result Value Ref Range   HSV 1 IgM Antibodies <1:10 <1:10 titer   HSV 2 IgM Antibodies <1:10 <1:10 titer    Comment: (NOTE) HSV 1 and HSV 2 share many cross-reacting antigens. Elevated  titers to both HSV 1 and HSV 2 may represent crossreactive HSV antibodies rather than exposure to  both HSV 1 and HSV 2. Results for this test are for research purposes only by the assay's manufacturer. The performance characteristics of this product have not been established.  Results should not be used as a diagnostic procedure without confirmation of the diagnosis by another medically established diagnostic product or procedure. Performed At: [facility] [address] [place], [place] [id number] Lindon Romp MD SN:[id number]   Toxoplasma gondii antibody, IgM     Status: None   Collection Time: 11/13/14 11:00 AM  Result Value Ref Range   Toxoplasma Antibody- IgM <3.0 0.0 - 7.9 AU/mL    Comment: (NOTE)                             Negative            <8.0                             Equivocal      8.0 - 9.9                             Positive            >9.9 Performed At: [facility] [place], [place] [id number] Lindon Romp MD BD:[id number]   Comment     Status: None   Collection Time: 11/13/14 11:00 AM  Result Value Ref Range   Comment Comment     Comment: (NOTE) It is presumed the patient has not been infected with and is not undergoing an acute infection with Toxoplasma. If symptoms persist, submit a new specimen after three weeks. Performed At: [facility] [place], [place] [id number] Lindon Romp MD [id number]   Nicotine screen, urine     Status: None   Collection Time: 11/13/14 12:00 PM  Result Value Ref Range   Nicotine, urine < 2 ng/mL    Comment: (NOTE)  REFERENCE RANGES FOR NICOTINE:      Smokers: 200-700 ng/mL      Nonsmokers: <=17 ng/mL    Cotinine, Urine < 2 ng/mL    Comment: (NOTE)  REFERENCE RANGES FOR COTININE:      Smokers: [phone] ng/mL      Nonsmokers: <=20 ng/mL Performed at [facility]   Urine rapid drug screen (hosp performed)     Status: None   Collection Time:  11/15/14  3:29 PM  Result Value Ref Range   Opiates NONE DETECTED NONE DETECTED   Cocaine NONE DETECTED NONE DETECTED   Benzodiazepines NONE DETECTED NONE DETECTED   Amphetamines NONE DETECTED NONE DETECTED   Tetrahydrocannabinol NONE DETECTED NONE DETECTED   Barbiturates NONE DETECTED NONE DETECTED    Comment:        DRUG SCREEN FOR MEDICAL PURPOSES ONLY.  IF CONFIRMATION IS NEEDED FOR ANY PURPOSE, NOTIFY LAB WITHIN 5 DAYS.        LOWEST DETECTABLE LIMITS FOR URINE DRUG SCREEN Drug Class       Cutoff (ng/mL) Amphetamine      1000 Barbiturate      200 Benzodiazepine   081 Tricyclics       448 Opiates          300 Cocaine          300 THC              50  Ethanol     Status: None   Collection Time: 11/18/14  3:00 PM  Result Value Ref Range   Alcohol, Ethyl (B) <5 <5 mg/dL    Comment:        LOWEST DETECTABLE LIMIT FOR SERUM ALCOHOL IS 5 mg/dL FOR MEDICAL PURPOSES ONLY   INR/PT     Status: Abnormal   Collection Time: 11/18/14  3:00 PM  Result Value Ref Range   Prothrombin Time 33.8 (H) 11.6 - 15.2 seconds   INR 3.43 (H) 0.00 - 1.49  Magnesium     Status: None   Collection Time: 11/18/14  3:00 PM  Result Value Ref Range   Magnesium 2.0 1.7 - 2.4 mg/dL  Basic metabolic panel     Status: Abnormal   Collection Time: 11/18/14  3:12 PM  Result Value Ref Range   Sodium 139 135 - 145 mmol/L   Potassium 4.1 3.5 - 5.1 mmol/L   Chloride 106 101 - 111 mmol/L   CO2 23 22 - 32 mmol/L   Glucose, Bld 117 (H) 65 - 99 mg/dL   BUN 15 6 - 20 mg/dL   Creatinine, Ser 1.47 (H) 0.61 - 1.24 mg/dL   Calcium 9.0 8.9 - 10.3 mg/dL   GFR calc non Af Amer 50 (L) >60 mL/min   GFR calc Af Amer 59 (L) >60 mL/min    Comment: (NOTE) The eGFR has been calculated using the CKD EPI equation. This calculation has not been validated in all clinical situations. eGFR's persistently <60 mL/min signify possible Chronic Kidney Disease.    Anion gap 10 5 - 15    ASSESSMENT AND PLAN:   1) Chronic  systolic HF, s/p HMII LVAD implant 05/2012. Currently 1B at [facility].  - Stable NYHA I-II. Volume status ok. - Now being dual listed at [facility]. RHC looks great - He is not on a BB. He was able to tolerate Toprol before LVAD, however while in the hospital with implant over a year ago had syncope on higher dose.  - Continue lisinopril, norvasc, doxazosin, at current doses. Resume hydralazine at 50 bid,  Intolerant of Imdur d/t headaches.  - Reinforced the need and importance of daily weights, a low sodium diet, and fluid restriction (less than 2 L a day). Instructed to call the HF clinic if weight increases more than 3 lbs overnight or 5 lbs in a week.  2)  HTN-  - MAP improved. Continue current regimen 3) Recurrent VT/NSVT - amio recently stopped due to thyrotoxicity. Start mexilitene 150 po bid. Discussed with EP 4) Hyperthyroidism - Discussed with Dr. Forde Dandy. 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 Off amio. On tapazole and prednisone. He has f/u with Dr. Forde Dandy.  5) Anticoagulation with coumadin  - Continue coumadin and ASA 325 mg for LVAD. Check INR and adjust coumadin accordingly per HF pharmacy.   6) LVAD  - Improved on 9600. Continue transplant process. VAD controller changed in clinic at last visit due to driveline fault alarm.    Glori Bickers MD  11:57 PM

## 2014-11-28 ENCOUNTER — Ambulatory Visit (HOSPITAL_COMMUNITY)
Admission: RE | Admit: 2014-11-28 | Discharge: 2014-11-28 | Disposition: A | Discharge status: Home / Self Care | Payer: Medicaid Other | Source: Ambulatory Visit | Attending: Cardiology | Admitting: Cardiology

## 2014-11-28 ENCOUNTER — Ambulatory Visit (HOSPITAL_COMMUNITY): Payer: Self-pay | Admitting: *Deleted

## 2014-11-28 ENCOUNTER — Other Ambulatory Visit (HOSPITAL_COMMUNITY): Payer: Self-pay | Admitting: *Deleted

## 2014-11-28 VITALS — BP 130/48 | HR 70 | Ht 74.0 in | Wt 241.0 lb

## 2014-11-28 DIAGNOSIS — I472 Ventricular tachycardia, unspecified: Secondary | ICD-10-CM

## 2014-11-28 DIAGNOSIS — I5022 Chronic systolic (congestive) heart failure: Secondary | ICD-10-CM | POA: Diagnosis not present

## 2014-11-28 DIAGNOSIS — I493 Ventricular premature depolarization: Secondary | ICD-10-CM | POA: Insufficient documentation

## 2014-11-28 DIAGNOSIS — Z7901 Long term (current) use of anticoagulants: Secondary | ICD-10-CM

## 2014-11-28 DIAGNOSIS — R0600 Dyspnea, unspecified: Secondary | ICD-10-CM | POA: Diagnosis not present

## 2014-11-28 DIAGNOSIS — I10 Essential (primary) hypertension: Secondary | ICD-10-CM | POA: Insufficient documentation

## 2014-11-28 DIAGNOSIS — Z7982 Long term (current) use of aspirin: Secondary | ICD-10-CM | POA: Diagnosis not present

## 2014-11-28 DIAGNOSIS — Z79899 Other long term (current) drug therapy: Secondary | ICD-10-CM | POA: Insufficient documentation

## 2014-11-28 DIAGNOSIS — T829XXA Unspecified complication of cardiac and vascular prosthetic device, implant and graft, initial encounter: Secondary | ICD-10-CM | POA: Diagnosis not present

## 2014-11-28 DIAGNOSIS — Z8679 Personal history of other diseases of the circulatory system: Secondary | ICD-10-CM

## 2014-11-28 DIAGNOSIS — I272 Other secondary pulmonary hypertension: Secondary | ICD-10-CM | POA: Diagnosis not present

## 2014-11-28 DIAGNOSIS — I428 Other cardiomyopathies: Secondary | ICD-10-CM | POA: Diagnosis not present

## 2014-11-28 DIAGNOSIS — Z95811 Presence of heart assist device: Secondary | ICD-10-CM

## 2014-11-28 DIAGNOSIS — E0590 Thyrotoxicosis, unspecified without thyrotoxic crisis or storm: Secondary | ICD-10-CM | POA: Insufficient documentation

## 2014-11-28 LAB — COMPREHENSIVE METABOLIC PANEL
ALBUMIN: 3.6 g/dL (ref 3.5–5.0)
ALT: 69 U/L — AB (ref 17–63)
AST: 34 U/L (ref 15–41)
Alkaline Phosphatase: 86 U/L (ref 38–126)
Anion gap: 9 (ref 5–15)
BUN: 12 mg/dL (ref 6–20)
CHLORIDE: 108 mmol/L (ref 101–111)
CO2: 24 mmol/L (ref 22–32)
CREATININE: 1.1 mg/dL (ref 0.61–1.24)
Calcium: 9 mg/dL (ref 8.9–10.3)
GFR calc Af Amer: >60 mL/min (ref >60)
GLUCOSE: 88 mg/dL (ref 65–99)
POTASSIUM: 3.6 mmol/L (ref 3.5–5.1)
Sodium: 141 mmol/L (ref 135–145)
Total Bilirubin: 0.9 mg/dL (ref 0.3–1.2)
Total Protein: 6.3 g/dL — ABNORMAL LOW (ref 6.5–8.1)

## 2014-11-28 LAB — CBC
HEMATOCRIT: 39.3 % (ref 39.0–52.0)
Hemoglobin: 13.2 g/dL (ref 13.0–17.0)
MCH: 28 pg (ref 26.0–34.0)
MCHC: 33.6 g/dL (ref 30.0–36.0)
MCV: 83.4 fL (ref 78.0–100.0)
Platelets: 190 10*3/uL (ref 150–400)
RBC: 4.71 MIL/uL (ref 4.22–5.81)
RDW: 13.1 % (ref 11.5–15.5)
WBC: 9.3 10*3/uL (ref 4.0–10.5)

## 2014-11-28 LAB — TSH: TSH: 0.034 u[IU]/mL — ABNORMAL LOW (ref 0.350–4.500)

## 2014-11-28 LAB — LACTATE DEHYDROGENASE: LDH: 203 U/L — ABNORMAL HIGH (ref 98–192)

## 2014-11-28 LAB — T4, FREE: Free T4: 4.78 ng/dL — ABNORMAL HIGH (ref 0.61–1.12)

## 2014-11-28 LAB — PROTIME-INR
INR: 3.44 — ABNORMAL HIGH (ref 0.00–1.49)
Prothrombin Time: 33.9 seconds — ABNORMAL HIGH (ref 11.6–15.2)

## 2014-11-28 LAB — PREALBUMIN: PREALBUMIN: 24 mg/dL (ref 18–38)

## 2014-11-28 LAB — BRAIN NATRIURETIC PEPTIDE: B Natriuretic Peptide: 84.5 pg/mL (ref 0.0–100.0)

## 2014-11-28 NOTE — Patient Instructions (Signed)
1.  Decrease Coumadin to 5 mg daily except 2.5 mg on Tues/Thurs. Let's re-check INR next Thursday. 2.  Device re-check in 3 weeks here. 3.  Return to VAD clinic in 2 months.

## 2014-11-28 NOTE — Progress Notes (Addendum)
Symptom  Yes  No  Details   Angina       x Activity:   Claudication       x How far:   Syncope       x When: occasional dizziness with orthostatic changes (especially bending over)   Stroke       x   Orthopnea       x How many pillows:  1 pillow  PND       x How often:  CPAP       N/A How many hrs:   Pedal edema       x   Abd fullness       x   N&V       x good appetite; intentional wt loss over last month  Diaphoresis       x When:  Bleeding      x   Urine color    light yellow  SOB       x Activity:   Palpitations        x      When: daily  ICD shock       x   Hospitlizaitons       x When/where/why:  ED visit       x When/where/why:  Other MD       x      When/who/why:  Dr. Evlyn Kanner   Activity        improved; performing daily activities  Fluid      No limitations  Diet      No limitations   Vital signs: HR:  70 MAP BP:  76 Auto cuff:  130/48 (76) O2 Sat:  100 Wt:  241 lbs Last wt:  253 lbs Ht:  6'2"  LVAD interrogation reveals:  Speed:  9600 Flow:   5.2 Power:  6.1 PI:  6.3 Alarms:  none Events:  0 - 5 PI events Fixed speed:  9600 Low speed limit:  9000  11V battery status:  Primary controller: replace 32 months  Secondary controller:  replace 15 months    LVAD exit site:  Well healed and incorporated. The velour is fully implanted at exit site. Dressing dry and intact. No erythema or drainage. Stabilization device present and accurately applied. Driveline dressing is being changed q 3 - 5 days per sterile technique using Sorbaview dressing with biopatch on exit site. Pt does self dressing changes. Pt denies fever or chills. Pt provided with adequate dressing supplies for home use.   Pt denies any alarms or VAD equipment issues. Pt is completing weekly and monthly maintenance for LVAD equipment. LVAD equipment check completed and is in good working order. Back-up equipment present. LVAD education done on emergency procedures and precautions and reviewed exit site  care.  All requested labs, tests, and procedure reports sent to [facility] to meet Medicaid requirements for consideration of heart transplantation candidacy.  Pt is awaiting word from [facility] re: listing for heart transplant. Pt received call from Dr. Jose Persia re: possible use of heart perfusion equipment for donors outside our region.  Dr. Gala Romney will discuss with Dr. Allena Katz.   Patient Instructions: 1.  Decrease Coumadin to 5 mg daily except 2.5 mg on Tues/Thurs. Let's re-check INR next Thursday. 2.  Device re-check in 3 weeks here. 3.  Return to VAD clinic in 2 months.  Hessie Diener, RN

## 2014-11-29 ENCOUNTER — Ambulatory Visit (HOSPITAL_COMMUNITY)
Admission: RE | Admit: 2014-11-29 | Discharge: 2014-11-29 | Disposition: A | Discharge status: Home / Self Care | Payer: Medicaid Other | Source: Ambulatory Visit | Attending: Endocrinology | Admitting: Endocrinology

## 2014-11-29 DIAGNOSIS — E0580 Other thyrotoxicosis without thyrotoxic crisis or storm: Secondary | ICD-10-CM

## 2014-11-29 DIAGNOSIS — I5022 Chronic systolic (congestive) heart failure: Secondary | ICD-10-CM | POA: Diagnosis not present

## 2014-11-29 LAB — T3: T3, Total: 187 ng/dL — ABNORMAL HIGH (ref 71–180)

## 2014-12-01 NOTE — Progress Notes (Signed)
Patient ID: Robert [name], male   DOB: 05-Aug-1955, 59 y.o.   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 status post dual chamber Medtronic ICD implanted April 2011. Cath 2011 showed normal coronaries. He also has a history of VTach . Currently listed as 1b list for heart tx at [facility]   Had [place] on 11/04/14 as part of dual listing process at [facility]. Normal filling pressures and cardiac output. Amio recently stopped due to thyrotoxicity. Placed on prednisone and tapazole. Had VT off amio and mexilitene started.  He is here for routine f/u. Feels much better. 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 On prednisone and tapazole. Occasional palpitations. No syncope. Occasionally some orthostasis. Has intentionally been trying to lose weight for transplant. No CP, SOB, edema. No bleeding.    Denies ICD shocks.Reports taking Coumadin as prescribed and adherence to anticoagulation based dietary restrictions.  Denies bright red blood per rectum or melena, no dark urine or hematuria.    ICD interrogated personally in clinic. No further VT. PVC slightly more frequent. Averaging 14/hr  LVAD interrogation reveals:  Speed: 9600  Flow: 5.2  Power: 6.1  PI: 6.3  Alarms: none  Events: 0 - 5 PI events  Fixed speed: 9600  Low speed limit: 9000   I reviewed the LVAD parameters from today, and compared the results to the patient's prior recorded data.  No programming changes were made.  The LVAD is functioning within specified parameters.  The patient performs LVAD self-test daily.  LVAD interrogation was negative for any significant power changes, alarms or PI events/speed drops.  LVAD equipment check completed and is in good working order.  Back-up equipment present.   LVAD education done on emergency procedures and precautions and reviewed exit  site care.    Past Medical History  Diagnosis Date  . AICD (automatic cardioverter/defibrillator) present   . Hypertension   . Congestive heart failure   . Pneumonia   . Bronchitis   . Pulmonary hypertension     Current Outpatient Prescriptions  Medication Sig Dispense Refill  . allopurinol (ZYLOPRIM) 300 MG tablet Take 300 mg by mouth 2 (two) times a week.    Marland Kitchen amLODipine (NORVASC) 10 MG tablet Take 1 tablet (10 mg total) by mouth daily. 90 tablet 3  . atorvastatin (LIPITOR) 20 MG tablet Take 1 tablet (20 mg total) by mouth daily. 90 tablet 3  . doxazosin (CARDURA) 4 MG tablet Take 4 mg by mouth daily.    . hydrALAZINE (APRESOLINE) 100 MG tablet Take 1 tablet (100 mg total) by mouth 2 (two) times daily. 30 tablet 6  . lisinopril (PRINIVIL,ZESTRIL) 20 MG tablet Take 2 tablets (36m)in the morning, and 1 tablet (229m in the evening. 270 tablet 3  . methimazole (TAPAZOLE) 10 MG tablet Take 1 tablet (10 mg total) by mouth 2 (two) times daily. 60 tablet 1  . mexiletine (MEXITIL) 150 MG capsule Take 1 capsule (150 mg total) by mouth 2 (two) times daily. 60 capsule 11  . pantoprazole (PROTONIX) 40 MG tablet Take 1 tablet (40 mg total) by mouth daily. 30 tablet 6  . predniSONE (DELTASONE) 10 MG tablet Take 1 tablet (10 mg total) by mouth daily with breakfast. 30 tablet 1  . warfarin (COUMADIN) 5 MG tablet Take 5 mg by mouth daily. Take  as directed for INR goal 2.0 - 3.0    . aspirin EC 325 MG tablet Take 1 tablet (325 mg total) by mouth daily. (Patient not taking: Reported on 11/28/2014) 30 tablet 6   No current facility-administered medications for this encounter.    Imdur and Metoprolol  REVIEW OF SYSTEMS: All systems negative except as listed in HPI, PMH and Problem list.  HR: 70  MAP BP: 76  Auto cuff: 130/48 (76)  O2 Sat: 100  Wt: 241 lbs  Last wt: 253 lbs  Ht: 6'2"   Physical Exam: GENERAL: Well appearing, male who presents to clinic today in no acute distress. HEENT:  normal  NECK: Supple, JVP flat;  2+ bilaterally, no bruits. RIJ site stable after cath.  No lymphadenopathy or thyromegaly appreciated.   CARDIAC:  Mechanical heart sounds with LVAD hum present.  LUNGS:  Clear to auscultation bilaterally.  ABDOMEN:  Soft, round, nontender, positive bowel sounds x4.     LVAD exit site: well-healed and incorporated.  Dressing dry and intact.  No erythema or drainage.  Stabilization device present and accurately applied.  Driveline dressing is being changed daily per sterile technique.Driveline itself with multiple tears and twists EXTREMITIES:  Warm and dry, no cyanosis, clubbing, rash or edema  NEUROLOGIC:  Alert and oriented x 4.  Gait steady.  No aphasia.  No dysarthria.  Affect pleasant.     Recent Results (from the past 2160 hour(s))  Protime-INR     Status: Abnormal   Collection Time: 09/05/14 11:43 AM  Result Value Ref Range   INR 2.5 (H) 0.8 - 1.0 ratio   Prothrombin Time 26.9 (H) 9.6 - 13.1 sec  Hepatic function panel     Status: Abnormal   Collection Time: 09/23/14  9:30 AM  Result Value Ref Range   Total Protein 7.2 6.5 - 8.1 g/dL   Albumin 3.8 3.5 - 5.0 g/dL   AST 21 15 - 41 U/L   ALT 18 17 - 63 U/L   Alkaline Phosphatase 88 38 - 126 U/L   Total Bilirubin 1.0 0.3 - 1.2 mg/dL   Bilirubin, Direct <0.1 (L) 0.1 - 0.5 mg/dL   Indirect Bilirubin NOT CALCULATED 0.3 - 0.9 mg/dL  Lipid Profile     Status: Abnormal   Collection Time: 09/23/14  9:42 AM  Result Value Ref Range   Cholesterol 224 (H) 0 - 200 mg/dL   Triglycerides 113 <150 mg/dL   HDL 60 >40 mg/dL   Total CHOL/HDL Ratio 3.7 RATIO   VLDL 23 0 - 40 mg/dL   LDL Cholesterol 141 (H) 0 - 99 mg/dL    Comment:        Total Cholesterol/HDL:CHD Risk Coronary Heart Disease Risk Table                     Men   Women  1/2 Average Risk   3.4   3.3  Average Risk       5.0   4.4  2 X Average Risk   9.6   7.1  3 X Average Risk  23.4   11.0        Use the calculated Patient Ratio above and the  CHD Risk Table to determine the patient's CHD Risk.        ATP III CLASSIFICATION (LDL):  <100     mg/dL   Optimal  100-129  mg/dL   Near or Above  Optimal  130-159  mg/dL   Borderline  160-189  mg/dL   High  >190     mg/dL   Very High   Brain natriuretic peptide     Status: None   Collection Time: 09/23/14  9:43 AM  Result Value Ref Range   B Natriuretic Peptide 61.6 0.0 - 100.0 pg/mL  Basic metabolic panel     Status: Abnormal   Collection Time: 09/23/14  9:44 AM  Result Value Ref Range   Sodium 139 135 - 145 mmol/L   Potassium 3.9 3.5 - 5.1 mmol/L   Chloride 105 101 - 111 mmol/L   CO2 25 22 - 32 mmol/L   Glucose, Bld 98 65 - 99 mg/dL   BUN 15 6 - 20 mg/dL   Creatinine, Ser 1.18 0.61 - 1.24 mg/dL   Calcium 8.8 (L) 8.9 - 10.3 mg/dL   GFR calc non Af Amer >60 >60 mL/min   GFR calc Af Amer >60 >60 mL/min    Comment: (NOTE) The eGFR has been calculated using the CKD EPI equation. This calculation has not been validated in all clinical situations. eGFR's persistently <60 mL/min signify possible Chronic Kidney Disease.    Anion gap 9 5 - 15  CBC     Status: None   Collection Time: 09/23/14  9:44 AM  Result Value Ref Range   WBC 7.0 4.0 - 10.5 K/uL   RBC 4.85 4.22 - 5.81 MIL/uL   Hemoglobin 14.0 13.0 - 17.0 g/dL   HCT 41.7 39.0 - 52.0 %   MCV 86.0 78.0 - 100.0 fL   MCH 28.9 26.0 - 34.0 pg   MCHC 33.6 30.0 - 36.0 g/dL   RDW 14.3 11.5 - 15.5 %   Platelets 168 150 - 400 K/uL  Protime-INR     Status: Abnormal   Collection Time: 09/23/14  9:44 AM  Result Value Ref Range   Prothrombin Time 26.0 (H) 11.6 - 15.2 seconds   INR 2.42 (H) 0.00 - 1.49  Lactate dehydrogenase     Status: Abnormal   Collection Time: 09/23/14  9:44 AM  Result Value Ref Range   LDH 219 (H) 98 - 192 U/L  TSH     Status: Abnormal   Collection Time: 10/15/14  1:21 PM  Result Value Ref Range   TSH 0.08 (L) 0.35 - 4.50 uIU/mL  Protime-INR     Status: Abnormal   Collection Time:  10/15/14  1:21 PM  Result Value Ref Range   INR 3.6 (H) 0.8 - 1.0 ratio   Prothrombin Time 38.9 (H) 9.6 - 13.1 sec  ABO/Rh     Status: None   Collection Time: 10/23/14 11:02 AM  Result Value Ref Range   ABO/RH(D) O POS   TSH     Status: Abnormal   Collection Time: 10/23/14 11:15 AM  Result Value Ref Range   TSH 0.029 (L) 0.350 - 4.500 uIU/mL  T4, free     Status: Abnormal   Collection Time: 10/23/14 11:15 AM  Result Value Ref Range   Free T4 4.61 (H) 0.61 - 1.12 ng/dL  T3, free     Status: Abnormal   Collection Time: 10/23/14 11:15 AM  Result Value Ref Range   T3, Free 8.3 (H) 2.0 - 4.4 pg/mL    Comment: (NOTE) Performed At: [facility] [place], [place] [id number] Lindon Romp MD DG:[id number]   INR/PT     Status: Abnormal   Collection Time: 10/23/14 11:15 AM  Result Value Ref Range   Prothrombin Time 20.1 (H) 11.6 - 15.2 seconds   INR 1.72 (H) 0.00 - 1.49  INR/PT     Status: Abnormal   Collection Time: 10/30/14 12:30 PM  Result Value Ref Range   Prothrombin Time 32.3 (H) 11.6 - 15.2 seconds   INR 3.22 (H) 0.00 - 1.49  CBC     Status: Abnormal   Collection Time: 11/04/14  6:38 AM  Result Value Ref Range   WBC 6.5 4.0 - 10.5 K/uL   RBC 4.50 4.22 - 5.81 MIL/uL   Hemoglobin 12.7 (L) 13.0 - 17.0 g/dL   HCT 38.2 (L) 39.0 - 52.0 %   MCV 84.9 78.0 - 100.0 fL   MCH 28.2 26.0 - 34.0 pg   MCHC 33.2 30.0 - 36.0 g/dL   RDW 13.0 11.5 - 15.5 %   Platelets 150 150 - 400 K/uL  Basic metabolic panel     Status: Abnormal   Collection Time: 11/04/14  6:38 AM  Result Value Ref Range   Sodium 139 135 - 145 mmol/L   Potassium 3.6 3.5 - 5.1 mmol/L   Chloride 107 101 - 111 mmol/L   CO2 25 22 - 32 mmol/L   Glucose, Bld 97 65 - 99 mg/dL   BUN 15 6 - 20 mg/dL   Creatinine, Ser 1.17 0.61 - 1.24 mg/dL   Calcium 8.6 (L) 8.9 - 10.3 mg/dL   GFR calc non Af Amer >60 >60 mL/min   GFR calc Af Amer >60 >60 mL/min    Comment: (NOTE) The eGFR has been calculated  using the CKD EPI equation. This calculation has not been validated in all clinical situations. eGFR's persistently <60 mL/min signify possible Chronic Kidney Disease.    Anion gap 7 5 - 15  Protime-INR     Status: Abnormal   Collection Time: 11/04/14  6:38 AM  Result Value Ref Range   Prothrombin Time 31.6 (H) 11.6 - 15.2 seconds   INR 3.12 (H) 0.00 - 1.49  I-STAT 3, venous blood gas (G3P V)     Status: Abnormal   Collection Time: 11/04/14  9:10 AM  Result Value Ref Range   pH, Ven 7.387 (H) 7.250 - 7.300   pCO2, Ven 42.4 (L) 45.0 - 50.0 mmHg   pO2, Ven 38.0 30.0 - 45.0 mmHg   Bicarbonate 25.5 (H) 20.0 - 24.0 mEq/L   TCO2 27 0 - 100 mmol/L   O2 Saturation 72.0 %   Sample type VENOUS    Comment NOTIFIED PHYSICIAN   I-STAT 3, venous blood gas (G3P V)     Status: Abnormal   Collection Time: 11/04/14  9:10 AM  Result Value Ref Range   pH, Ven 7.404 (H) 7.250 - 7.300   pCO2, Ven 38.9 (L) 45.0 - 50.0 mmHg   pO2, Ven 40.0 30.0 - 45.0 mmHg   Bicarbonate 24.3 (H) 20.0 - 24.0 mEq/L   TCO2 26 0 - 100 mmol/L   O2 Saturation 75.0 %   Sample type VENOUS   Lactate dehydrogenase     Status: Abnormal   Collection Time: 11/04/14 10:49 AM  Result Value Ref Range   LDH 228 (H) 98 - 192 U/L  INR/PT     Status: Abnormal   Collection Time: 11/11/14  2:00 PM  Result Value Ref Range   Prothrombin Time 26.4 (H) 11.6 - 15.2 seconds   INR 2.47 (H) 0.00 - 1.49  HgB A1c     Status: None  Collection Time: 11/13/14 11:00 AM  Result Value Ref Range   Hgb A1c MFr Bld 5.6 4.8 - 5.6 %    Comment: (NOTE)         Pre-diabetes: 5.7 - 6.4         Diabetes: >6.4         Glycemic control for adults with diabetes: <7.0    Mean Plasma Glucose 114 mg/dL    Comment: (NOTE) Performed At: [facility] [address] [place], [place] [id number] Lindon Romp MD [id number]   Hepatitis B Core Antibody, total     Status: None   Collection Time: 11/13/14 11:00 AM  Result Value Ref Range   Hep B  Core Total Ab Negative Negative    Comment: (NOTE) Performed At: [facility] [place], [place] [id number] Lindon Romp MD IW:[id number]   Hepatitis B Surface AntiBODY     Status: None   Collection Time: 11/13/14 11:00 AM  Result Value Ref Range   Hep B S Ab Reactive     Comment: (NOTE)              Non Reactive: Inconsistent with immunity,                            less than 10 mIU/mL              Reactive:     Consistent with immunity,                            greater than 9.9 mIU/mL Performed At: [facility] [place], [place] [id number] Lindon Romp MD [id number]   Hepatitis B Surface AntiGEN     Status: None   Collection Time: 11/13/14 11:00 AM  Result Value Ref Range   Hepatitis B Surface Ag Negative Negative    Comment: (NOTE) Performed At: [facility] [address] [place], [place] [id number] Lindon Romp MD DX:[id number]   HIV antibody (with reflex)     Status: None   Collection Time: 11/13/14 11:00 AM  Result Value Ref Range   HIV Screen 4th Generation wRfx Non Reactive Non Reactive    Comment: (NOTE) Performed At: [facility] [place], [place] [id number] Lindon Romp MD EZ:[id number]   Hepatitis C Antibody     Status: None   Collection Time: 11/13/14 11:00 AM  Result Value Ref Range   HCV Ab <0.1 0.0 - 0.9 s/co ratio    Comment: (NOTE)                                  Negative:     < 0.8                             Indeterminate: 0.8 - 0.9                                  Positive:     > 0.9 The CDC recommends that a positive HCV antibody result be followed up with a HCV Nucleic Acid Amplification test (503546). Performed At: [facility] [place], [place] [id number] Lindon Romp MD GY:[id number]   Hepatitis  A Antibody, IGM     Status: None   Collection Time: 11/13/14 11:00 AM  Result Value Ref Range   Hep A IgM Negative  Negative    Comment: (NOTE) Performed At: [place], [place] [id number] Lindon Romp MD TA:[id number]   RPR     Status: None   Collection Time: 11/13/14 11:00 AM  Result Value Ref Range   RPR Ser Ql Non Reactive Non Reactive    Comment: (NOTE) Performed At: [facility] [address] [place], [place] [id number] Lindon Romp MD [id number]   Epstein-Barr virus VCA, IgG     Status: None   Collection Time: 11/13/14 11:00 AM  Result Value Ref Range   EBV VCA IgG <18.0 0.0 - 17.9 U/mL    Comment: (NOTE)                                 Negative        <18.0                                 Equivocal 18.0 - 21.9                                 Positive        >21.9 Performed At: [facility] [address] [place], [place] [id number] Lindon Romp MD [id number]   Epstein-Barr virus VCA, IgM     Status: None   Collection Time: 11/13/14 11:00 AM  Result Value Ref Range   EBV VCA IgM <36.0 0.0 - 35.9 U/mL    Comment: (NOTE)                                 Negative        <36.0                                 Equivocal 36.0 - 43.9                                 Positive        >43.9 Performed At: [facility] [place], [place] [id number] Lindon Romp MD GS:[id number]   CMV IgM     Status: None   Collection Time: 11/13/14 11:00 AM  Result Value Ref Range   CMV IgM <30.0 0.0 - 29.9 AU/mL    Comment: (NOTE)                                Negative         <30.0                                Equivocal  30.0 - 34.9                                Positive         >  34.9 A positive result is generally indicative of acute infection, reactivation or persistent IgM production. Performed At: [facility] [place], [place] [id number] Lindon Romp MD HW:[id number]   Cmv antibody, IgG (EIA)     Status: Abnormal   Collection Time: 11/13/14 11:00 AM  Result Value Ref Range    CMV Ab - IgG 4.90 (H) 0.00 - 0.59 U/mL    Comment: (NOTE)                               Negative          <0.60                               Equivocal   0.60 - 0.69                               Positive          >0.69 Performed At: [facility] [place], [place] [id number] Lindon Romp MD ZW:[id number]   Varicella zoster antibody, IgG     Status: None   Collection Time: 11/13/14 11:00 AM  Result Value Ref Range   Varicella IgG 1851 Immune >165 index    Comment: (NOTE)                               Negative          <135                               Equivocal    135 - 165                               Positive          >165 A positive result generally indicates exposure to the pathogen or administration of specific immunoglobulins, but it is not indication of active infection or stage of disease. Performed At: [facility] [place], [place] [id number] Lindon Romp MD [id number]   HSV 1 AND 2 IGM ABS, INDIRECT     Status: None   Collection Time: 11/13/14 11:00 AM  Result Value Ref Range   HSV 1 IgM Antibodies <1:10 <1:10 titer   HSV 2 IgM Antibodies <1:10 <1:10 titer    Comment: (NOTE) HSV 1 and HSV 2 share many cross-reacting antigens. Elevated titers to both HSV 1 and HSV 2 may represent crossreactive HSV antibodies rather than exposure to both HSV 1 and HSV 2. Results for this test are for research purposes only by the assay's manufacturer. The performance characteristics of this product have not been established.  Results should not be used as a diagnostic procedure without confirmation of the diagnosis by another medically established diagnostic product or procedure. Performed At: [facility] [place], [place] [id number] Lindon Romp MD PY:[id number]   Toxoplasma gondii antibody, IgM     Status: None   Collection Time: 11/13/14 11:00 AM  Result Value Ref Range   Toxoplasma  Antibody- IgM <3.0 0.0 - 7.9 AU/mL    Comment: (NOTE)  Negative            <8.0                             Equivocal      8.0 - 9.9                             Positive            >9.9 Performed At: [facility] [place], [place] [id number] Lindon Romp MD [id number]   Comment     Status: None   Collection Time: 11/13/14 11:00 AM  Result Value Ref Range   Comment Comment     Comment: (NOTE) It is presumed the patient has not been infected with and is not undergoing an acute infection with Toxoplasma. If symptoms persist, submit a new specimen after three weeks. Performed At: [facility] [place], [place] [id number] Lindon Romp MD BR:[id number]   Nicotine screen, urine     Status: None   Collection Time: 11/13/14 12:00 PM  Result Value Ref Range   Nicotine, urine < 2 ng/mL    Comment: (NOTE)  REFERENCE RANGES FOR NICOTINE:      Smokers: 200-700 ng/mL      Nonsmokers: <=17 ng/mL    Cotinine, Urine < 2 ng/mL    Comment: (NOTE)  REFERENCE RANGES FOR COTININE:      Smokers: [phone] ng/mL      Nonsmokers: <=20 ng/mL Performed at [facility]   Urine rapid drug screen (hosp performed)     Status: None   Collection Time: 11/15/14  3:29 PM  Result Value Ref Range   Opiates NONE DETECTED NONE DETECTED   Cocaine NONE DETECTED NONE DETECTED   Benzodiazepines NONE DETECTED NONE DETECTED   Amphetamines NONE DETECTED NONE DETECTED   Tetrahydrocannabinol NONE DETECTED NONE DETECTED   Barbiturates NONE DETECTED NONE DETECTED    Comment:        DRUG SCREEN FOR MEDICAL PURPOSES ONLY.  IF CONFIRMATION IS NEEDED FOR ANY PURPOSE, NOTIFY LAB WITHIN 5 DAYS.        LOWEST DETECTABLE LIMITS FOR URINE DRUG SCREEN Drug Class       Cutoff (ng/mL) Amphetamine      1000 Barbiturate      200 Benzodiazepine   881 Tricyclics       103 Opiates          300 Cocaine          300 THC              50    Ethanol     Status: None   Collection Time: 11/18/14  3:00 PM  Result Value Ref Range   Alcohol, Ethyl (B) <5 <5 mg/dL    Comment:        LOWEST DETECTABLE LIMIT FOR SERUM ALCOHOL IS 5 mg/dL FOR MEDICAL PURPOSES ONLY   INR/PT     Status: Abnormal   Collection Time: 11/18/14  3:00 PM  Result Value Ref Range   Prothrombin Time 33.8 (H) 11.6 - 15.2 seconds   INR 3.43 (H) 0.00 - 1.49  Magnesium     Status: None   Collection Time: 11/18/14  3:00 PM  Result Value Ref Range   Magnesium 2.0 1.7 - 2.4 mg/dL  Basic metabolic panel     Status: Abnormal  Collection Time: 11/18/14  3:12 PM  Result Value Ref Range   Sodium 139 135 - 145 mmol/L   Potassium 4.1 3.5 - 5.1 mmol/L   Chloride 106 101 - 111 mmol/L   CO2 23 22 - 32 mmol/L   Glucose, Bld 117 (H) 65 - 99 mg/dL   BUN 15 6 - 20 mg/dL   Creatinine, Ser 1.47 (H) 0.61 - 1.24 mg/dL   Calcium 9.0 8.9 - 10.3 mg/dL   GFR calc non Af Amer 50 (L) >60 mL/min   GFR calc Af Amer 59 (L) >60 mL/min    Comment: (NOTE) The eGFR has been calculated using the CKD EPI equation. This calculation has not been validated in all clinical situations. eGFR's persistently <60 mL/min signify possible Chronic Kidney Disease.    Anion gap 10 5 - 15  Brain natriuretic peptide     Status: None   Collection Time: 11/28/14 10:32 AM  Result Value Ref Range   B Natriuretic Peptide 84.5 0.0 - 100.0 pg/mL  CBC     Status: None   Collection Time: 11/28/14 10:32 AM  Result Value Ref Range   WBC 9.3 4.0 - 10.5 K/uL   RBC 4.71 4.22 - 5.81 MIL/uL   Hemoglobin 13.2 13.0 - 17.0 g/dL   HCT 39.3 39.0 - 52.0 %   MCV 83.4 78.0 - 100.0 fL   MCH 28.0 26.0 - 34.0 pg   MCHC 33.6 30.0 - 36.0 g/dL   RDW 13.1 11.5 - 15.5 %   Platelets 190 150 - 400 K/uL  Protime-INR     Status: Abnormal   Collection Time: 11/28/14 10:32 AM  Result Value Ref Range   Prothrombin Time 33.9 (H) 11.6 - 15.2 seconds   INR 3.44 (H) 0.00 - 1.49  Lactate dehydrogenase     Status: Abnormal    Collection Time: 11/28/14 10:32 AM  Result Value Ref Range   LDH 203 (H) 98 - 192 U/L  Prealbumin     Status: None   Collection Time: 11/28/14 10:32 AM  Result Value Ref Range   Prealbumin 24.0 18 - 38 mg/dL  Comprehensive Metabolic Panel (CMET)     Status: Abnormal   Collection Time: 11/28/14 10:32 AM  Result Value Ref Range   Sodium 141 135 - 145 mmol/L   Potassium 3.6 3.5 - 5.1 mmol/L   Chloride 108 101 - 111 mmol/L   CO2 24 22 - 32 mmol/L   Glucose, Bld 88 65 - 99 mg/dL   BUN 12 6 - 20 mg/dL   Creatinine, Ser 1.10 0.61 - 1.24 mg/dL   Calcium 9.0 8.9 - 10.3 mg/dL   Total Protein 6.3 (L) 6.5 - 8.1 g/dL   Albumin 3.6 3.5 - 5.0 g/dL   AST 34 15 - 41 U/L   ALT 69 (H) 17 - 63 U/L   Alkaline Phosphatase 86 38 - 126 U/L   Total Bilirubin 0.9 0.3 - 1.2 mg/dL   GFR calc non Af Amer >60 >60 mL/min   GFR calc Af Amer >60 >60 mL/min    Comment: (NOTE) The eGFR has been calculated using the CKD EPI equation. This calculation has not been validated in all clinical situations. eGFR's persistently <60 mL/min signify possible Chronic Kidney Disease.    Anion gap 9 5 - 15  T4, free     Status: Abnormal   Collection Time: 11/28/14 10:32 AM  Result Value Ref Range   Free T4 4.78 (H) 0.61 - 1.12 ng/dL  T3     Status: Abnormal   Collection Time: 11/28/14 10:32 AM  Result Value Ref Range   T3, Total 187 (H) 71 - 180 ng/dL    Comment: (NOTE) Performed At: [facility] [place], [place] [id number] Lindon Romp MD [id number]   TSH     Status: Abnormal   Collection Time: 11/28/14 10:32 AM  Result Value Ref Range   TSH 0.034 (L) 0.350 - 4.500 uIU/mL    ASSESSMENT AND PLAN:   1) Chronic systolic HF, s/p HMII LVAD implant 05/2012. Currently 1B at [facility].  - He is improved with VAD speed down to 9600.  - Now being dual listed at [facility] and [facility]. RHC looks great. We discussed the beating heart trial which will allow him to be eligible for organs form farther away.   - He is not on a BB. He was able to tolerate Toprol before LVAD, however while in the hospital with implant over a year ago had syncope on higher dose.  - Continue lisinopril, norvasc, doxazosin, at current doses. Continue hydralazine,  Intolerant of Imdur d/t headaches.  - Reinforced the need and importance of daily weights, a low sodium diet, and fluid restriction (less than 2 L a day). Instructed to call the HF clinic if weight increases more than 3 lbs overnight or 5 lbs in a week.  2)  HTN-  - MAP improved. Continue current regimen 3) Hyperthyroidism - Follows with Dr. Forde Dandy. 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 Amio stopped. Now on tapazole and prednisone.  4) NSVT/VT  - Stopped amio as above. Mexilitene started, ICD interrogated personally. No VT or AF. Volume status ok. Activity level increasing.  5) PVCs - slightly increased off amio. Continue mexilitene. Hopefully will improve with treatment of hyperthyroidism. Check electrolytes. 6) Anticoagulation with coumadin  - Continue coumadin and ASA 325 mg for LVAD. Check INR and adjust coumadin accordingly per HF pharmacy.   7) LVAD  - Improved on 9600. Continue transplant process. Driveline site looks good.   Glori Bickers MD  10:17 PM

## 2014-12-05 ENCOUNTER — Other Ambulatory Visit: Payer: Medicaid Other

## 2014-12-05 ENCOUNTER — Telehealth (HOSPITAL_COMMUNITY): Payer: Self-pay | Admitting: *Deleted

## 2014-12-05 DIAGNOSIS — Z7901 Long term (current) use of anticoagulants: Secondary | ICD-10-CM

## 2014-12-05 DIAGNOSIS — Z95811 Presence of heart assist device: Secondary | ICD-10-CM

## 2014-12-05 DIAGNOSIS — E0590 Thyrotoxicosis, unspecified without thyrotoxic crisis or storm: Secondary | ICD-10-CM

## 2014-12-05 NOTE — Telephone Encounter (Signed)
Called pt re: missed INR appt today - he thought appt was tomorrow.  Pt informed us he was listed for heart transplant at [facility] last week as status 1B.  Pt also spoke with Dr. Evlyn Kanner and was told to increase his Tapazole to 20 mg twice daily based on thyroid panel results from 11/28/14.  Pt will come to [facility] clinic tomorrow for BP check and INR.

## 2014-12-06 ENCOUNTER — Other Ambulatory Visit (HOSPITAL_COMMUNITY): Payer: Self-pay | Admitting: *Deleted

## 2014-12-06 ENCOUNTER — Ambulatory Visit: Payer: Self-pay | Admitting: *Deleted

## 2014-12-06 ENCOUNTER — Ambulatory Visit (HOSPITAL_COMMUNITY)
Admission: RE | Admit: 2014-12-06 | Discharge: 2014-12-06 | Disposition: A | Discharge status: Home / Self Care | Payer: Medicaid Other | Source: Ambulatory Visit | Attending: Internal Medicine | Admitting: Internal Medicine

## 2014-12-06 DIAGNOSIS — I472 Ventricular tachycardia: Secondary | ICD-10-CM | POA: Diagnosis not present

## 2014-12-06 DIAGNOSIS — Z95811 Presence of heart assist device: Secondary | ICD-10-CM | POA: Diagnosis not present

## 2014-12-06 DIAGNOSIS — Z5181 Encounter for therapeutic drug level monitoring: Secondary | ICD-10-CM | POA: Insufficient documentation

## 2014-12-06 DIAGNOSIS — I5022 Chronic systolic (congestive) heart failure: Secondary | ICD-10-CM

## 2014-12-06 DIAGNOSIS — I4729 Other ventricular tachycardia: Secondary | ICD-10-CM

## 2014-12-06 DIAGNOSIS — Z7901 Long term (current) use of anticoagulants: Secondary | ICD-10-CM | POA: Insufficient documentation

## 2014-12-06 DIAGNOSIS — I493 Ventricular premature depolarization: Secondary | ICD-10-CM

## 2014-12-06 DIAGNOSIS — I10 Essential (primary) hypertension: Secondary | ICD-10-CM

## 2014-12-06 LAB — PROTIME-INR
INR: 1.42 (ref 0.00–1.49)
PROTHROMBIN TIME: 17.5 s — AB (ref 11.6–15.2)

## 2014-12-06 MED ORDER — ENOXAPARIN SODIUM 120 MG/0.8ML ~~LOC~~ SOLN: 120.0000 mg | Freq: Two times a day (BID) | SUBCUTANEOUS | Status: DC

## 2014-12-09 ENCOUNTER — Other Ambulatory Visit (HOSPITAL_COMMUNITY): Payer: Self-pay | Admitting: *Deleted

## 2014-12-09 DIAGNOSIS — Z7901 Long term (current) use of anticoagulants: Principal | ICD-10-CM

## 2014-12-09 DIAGNOSIS — Z5181 Encounter for therapeutic drug level monitoring: Secondary | ICD-10-CM

## 2014-12-09 MED ORDER — ENOXAPARIN SODIUM 120 MG/0.8ML ~~LOC~~ SOLN: 120.0000 mg | Freq: Two times a day (BID) | SUBCUTANEOUS | Status: DC

## 2014-12-10 ENCOUNTER — Ambulatory Visit: Payer: Self-pay | Admitting: *Deleted

## 2014-12-10 ENCOUNTER — Other Ambulatory Visit: Payer: Medicaid Other

## 2014-12-10 ENCOUNTER — Ambulatory Visit (HOSPITAL_COMMUNITY)
Admission: RE | Admit: 2014-12-10 | Discharge: 2014-12-10 | Disposition: A | Discharge status: Home / Self Care | Payer: Medicaid Other | Source: Ambulatory Visit | Attending: Cardiology | Admitting: Cardiology

## 2014-12-10 ENCOUNTER — Other Ambulatory Visit: Payer: Self-pay | Admitting: Infectious Diseases

## 2014-12-10 DIAGNOSIS — R002 Palpitations: Secondary | ICD-10-CM | POA: Diagnosis present

## 2014-12-10 DIAGNOSIS — Z7901 Long term (current) use of anticoagulants: Secondary | ICD-10-CM | POA: Diagnosis not present

## 2014-12-10 DIAGNOSIS — Z95811 Presence of heart assist device: Secondary | ICD-10-CM | POA: Diagnosis not present

## 2014-12-10 DIAGNOSIS — Z79899 Other long term (current) drug therapy: Secondary | ICD-10-CM | POA: Diagnosis not present

## 2014-12-10 LAB — BASIC METABOLIC PANEL
Anion gap: 8 (ref 5–15)
BUN: 12 mg/dL (ref 6–20)
CALCIUM: 8.8 mg/dL — AB (ref 8.9–10.3)
CO2: 23 mmol/L (ref 22–32)
CREATININE: 1.15 mg/dL (ref 0.61–1.24)
Chloride: 107 mmol/L (ref 101–111)
GFR calc Af Amer: >60 mL/min (ref >60)
GLUCOSE: 90 mg/dL (ref 65–99)
Potassium: 3.5 mmol/L (ref 3.5–5.1)
SODIUM: 138 mmol/L (ref 135–145)

## 2014-12-10 LAB — MAGNESIUM: MAGNESIUM: 2 mg/dL (ref 1.7–2.4)

## 2014-12-10 LAB — LACTATE DEHYDROGENASE: LDH: 209 U/L — ABNORMAL HIGH (ref 98–192)

## 2014-12-10 LAB — PROTIME-INR
INR: 2.19 — AB (ref 0.00–1.49)
PROTHROMBIN TIME: 24.2 s — AB (ref 11.6–15.2)

## 2014-12-10 NOTE — Patient Instructions (Signed)
1.  Stop Lovenox; take coumadin 5 mg (one tablet) daily except 2.5 (1/2 tab) on Mon/Friday. 2.  Return for BP, device check, and labs on 12/17/14 at 9 am.

## 2014-12-10 NOTE — Progress Notes (Signed)
Symptom  Yes  No  Details   Angina       x Activity:   Claudication       x How far:   Syncope       x When: near syncopal event last Friday in drug store (bending over)EMS called  Stroke       x   Orthopnea       x How many pillows:  1 pillow  PND       x How often:  CPAP       N/A How many hrs:   Pedal edema       x   Abd fullness       x   N&V       x good appetite; intentional wt loss over last month  Diaphoresis       x When:  Bleeding      x   Urine color    light yellow  SOB       x Activity:   Palpitations        x      When: daily; increasing   ICD shock       x   Hospitlizaitons       x When/where/why:  ED visit       x When/where/why:  Other MD       x When/who/why:    Activity        decreased due to palpitations  Fluid      No limitations  Diet      No limitations   Vital signs: HR:  77 MAP BP:  120;  130; 110 Auto cuff:  109/78 (91);  118/83 (95) O2 Sat:  100 Wt:  239.4  lbs Last wt:  241 lbs Ht:  6'2"  LVAD interrogation reveals:  Speed:  9600 Flow:   5.7 Power:  6.3 PI:  6.7 Alarms:  none Events:  0 - 2  PI events Fixed speed:  9600 Low speed limit:  9000  11V battery status:  Primary controller: replace 32 months  Secondary controller:  replace 15 months    LVAD exit site:  Well healed and incorporated. The velour is fully implanted at exit site. Dressing dry and intact. No erythema or drainage. Stabilization device present and accurately applied. Driveline dressing is being changed q 3 - 5 days per sterile technique using Sorbaview dressing with biopatch on exit site. Pt does self dressing changes. Pt denies fever or chills.  Pt denies any alarms or VAD equipment issues. Pt is completing weekly and monthly maintenance for LVAD equipment. LVAD equipment check completed and is in good working order. Back-up equipment present. LVAD education done on emergency procedures and precautions and reviewed exit site care.  Pt presented to clinic after c/o  feeling "second heartbeat" more over last week, states he could not take son shopping yesterday due to dizziness associated with these palpitations. Reports was in local pharmacy last Friday and was bending over looking at birthday cards and "nearly went out"; called for help, staff assisted him to sitting position and called EMS.  After waiting for ambulance for 30 mins, pt felt better and left store before being transported to hospital. States his activity level is diminished due to palpitations and dizzy episodes.  Full ICD device check per Leta Jungling ([employer]); reviewed by Dr. Gala Romney with the following changes:    1.  Turned VT therapies off  2.  Monitor VT zone at  rate 122 (20 beats)  3.  Increased VF zone to 250 bpm with therapies:  ATP before charging, 35J x 6  Patient Instructions: 1.  Decrease Coumadin to 5 mg daily except 2.5 mg on Tues/Thurs. Let's re-check INR next Thursday. 2.  Device re-check in 3 weeks here. 3.  Return to [facility] clinic in 2 months.  Hessie Diener, RN

## 2014-12-10 NOTE — Progress Notes (Addendum)
Patient ID: Robert [name], male   DOB: 1955-08-26, 59 y.o.   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 status post dual chamber Medtronic ICD implanted April 2011. Cath 2011 showed normal coronaries. He also has a history of VTach . Currently listed as 1b list for heart tx at [facility].   Had [place] on 11/04/14 as part of dual listing process at [facility]. Normal filling pressures and cardiac output. Amio recently stopped due to thyrotoxicity. Placed on prednisone and tapazole. Had VT off amio and mexilitene started.  He is here for unscheduled visit due to recurrent palpitations. Recently switched amio to mexilitene due to thyrotoxicity. Has been seeing Robert [name]. On prednisone and tapazole which was just increased. Says the past few days has been having more palpitations. No syncope. No ICD shocks. Occasionally some orthostasis. No CP, SOB, edema. No bleeding. Recently dual listed at [facility]   Denies ICD shocks.Reports taking Coumadin as prescribed and adherence to anticoagulation based dietary restrictions.  Denies bright red blood per rectum or melena, no dark urine or hematuria.    ICD interrogated personally in clinic. No shocks. Optivol level has spiked a couple of times. Occasional brief NSVT but rare. Detection zone unable to catch below rates of 150.   LVAD interrogation reveals:  Speed: 9600 Flow: 5.7 Power: 6.3 PI: 6.7 Alarms: none Events: 0 - 2 PI events Fixed speed: 9600 Low speed limit: 9000   I reviewed the LVAD parameters from today, and compared the results to the patient's prior recorded data.  No programming changes were made.  The LVAD is functioning within specified parameters.  The patient performs LVAD self-test daily.  LVAD interrogation was negative for any significant  power changes, alarms or PI events/speed drops.  LVAD equipment check completed and is in good working order.  Back-up equipment present.   LVAD education done on emergency procedures and precautions and reviewed exit site care.    Past Medical History  Diagnosis Date  . AICD (automatic cardioverter/defibrillator) present   . Hypertension   . Congestive heart failure   . Pneumonia   . Bronchitis   . Pulmonary hypertension     Current Outpatient Prescriptions  Medication Sig Dispense Refill  . allopurinol (ZYLOPRIM) 300 MG tablet Take 300 mg by mouth 2 (two) times a week.    Marland Kitchen amLODipine (NORVASC) 10 MG tablet Take 1 tablet (10 mg total) by mouth daily. 90 tablet 3  . aspirin EC 325 MG tablet Take 1 tablet (325 mg total) by mouth daily. 30 tablet 6  . atorvastatin (LIPITOR) 20 MG tablet Take 1 tablet (20 mg total) by mouth daily. 90 tablet 3  . doxazosin (CARDURA) 4 MG tablet Take 4 mg by mouth daily.    Marland Kitchen enoxaparin (LOVENOX) 120 MG/0.8ML injection Inject 0.8 mLs (120 mg total) into the skin every 12 (twelve) hours. 10 Syringe 3  . hydrALAZINE (APRESOLINE) 100 MG tablet Take 1 tablet (100 mg total) by mouth 2 (two) times daily. 30 tablet 6  . lisinopril (PRINIVIL,ZESTRIL) 20 MG tablet Take 2 tablets (32m)in the morning, and 1 tablet (29m in the evening. 270 tablet 3  . methimazole (TAPAZOLE) 10 MG tablet Take 1 tablet (10 mg total) by mouth 2 (two) times daily. (Patient taking differently:  Take 20 mg by mouth 2 (two) times daily. ) 60 tablet 1  . mexiletine (MEXITIL) 150 MG capsule Take 1 capsule (150 mg total) by mouth 2 (two) times daily. 60 capsule 11  . pantoprazole (PROTONIX) 40 MG tablet Take 1 tablet (40 mg total) by mouth daily. 30 tablet 6  . predniSONE (DELTASONE) 10 MG tablet Take 1 tablet (10 mg total) by mouth daily with breakfast. 30 tablet 1  . warfarin (COUMADIN) 5 MG tablet Take 5 mg by mouth daily. Take as directed for INR goal 2.0 - 3.0     No current  facility-administered medications for this encounter.    Imdur and Metoprolol  REVIEW OF SYSTEMS: All systems negative except as listed in HPI, PMH and Problem list.  Vitals HR: 77 MAP BP: 120; 130; 110 Auto cuff: 109/78 (91); 118/83 (95) - did not take BP meds this am O2 Sat: 100 Wt: 239.4 lbs Last wt: 241 lbs Ht: 6'2"  Physical Exam: GENERAL: Well appearing, male who presents to clinic today in no acute distress. HEENT: normal  NECK: Supple, JVP flat;  2+ bilaterally, no bruits. RIJ site stable after cath.  No lymphadenopathy or thyromegaly appreciated.   CARDIAC:  Mechanical heart sounds with LVAD hum present.  LUNGS:  Clear to auscultation bilaterally.  ABDOMEN:  Soft, round, nontender, positive bowel sounds x4.     LVAD exit site: well-healed and incorporated.  Dressing dry and intact.  No erythema or drainage.  Stabilization device present and accurately applied.  Driveline dressing is being changed daily per sterile technique.Driveline itself with multiple tears and twists EXTREMITIES:  Warm and dry, no cyanosis, clubbing, rash or edema  NEUROLOGIC:  Alert and oriented x 4.  Gait steady.  No aphasia.  No dysarthria.  Affect pleasant.     Recent Results (from the past 2160 hour(s))  Hepatic function panel     Status: Abnormal   Collection Time: 09/23/14  9:30 AM  Result Value Ref Range   Total Protein 7.2 6.5 - 8.1 g/dL   Albumin 3.8 3.5 - 5.0 g/dL   AST 21 15 - 41 U/L   ALT 18 17 - 63 U/L   Alkaline Phosphatase 88 38 - 126 U/L   Total Bilirubin 1.0 0.3 - 1.2 mg/dL   Bilirubin, Direct <0.1 (L) 0.1 - 0.5 mg/dL   Indirect Bilirubin NOT CALCULATED 0.3 - 0.9 mg/dL  Lipid Profile     Status: Abnormal   Collection Time: 09/23/14  9:42 AM  Result Value Ref Range   Cholesterol 224 (H) 0 - 200 mg/dL   Triglycerides 113 <150 mg/dL   HDL 60 >40 mg/dL   Total CHOL/HDL Ratio 3.7 RATIO   VLDL 23 0 - 40 mg/dL   LDL Cholesterol 141 (H) 0 - 99 mg/dL    Comment:         Total Cholesterol/HDL:CHD Risk Coronary Heart Disease Risk Table                     Men   Women  1/2 Average Risk   3.4   3.3  Average Risk       5.0   4.4  2 X Average Risk   9.6   7.1  3 X Average Risk  23.4   11.0        Use the calculated Patient Ratio above and the CHD Risk Table to determine the patient's CHD Risk.        ATP  III CLASSIFICATION (LDL):  <100     mg/dL   Optimal  100-129  mg/dL   Near or Above                    Optimal  130-159  mg/dL   Borderline  160-189  mg/dL   High  >190     mg/dL   Very High   Brain natriuretic peptide     Status: None   Collection Time: 09/23/14  9:43 AM  Result Value Ref Range   B Natriuretic Peptide 61.6 0.0 - 100.0 pg/mL  Basic metabolic panel     Status: Abnormal   Collection Time: 09/23/14  9:44 AM  Result Value Ref Range   Sodium 139 135 - 145 mmol/L   Potassium 3.9 3.5 - 5.1 mmol/L   Chloride 105 101 - 111 mmol/L   CO2 25 22 - 32 mmol/L   Glucose, Bld 98 65 - 99 mg/dL   BUN 15 6 - 20 mg/dL   Creatinine, Ser 1.18 0.61 - 1.24 mg/dL   Calcium 8.8 (L) 8.9 - 10.3 mg/dL   GFR calc non Af Amer >60 >60 mL/min   GFR calc Af Amer >60 >60 mL/min    Comment: (NOTE) The eGFR has been calculated using the CKD EPI equation. This calculation has not been validated in all clinical situations. eGFR's persistently <60 mL/min signify possible Chronic Kidney Disease.    Anion gap 9 5 - 15  CBC     Status: None   Collection Time: 09/23/14  9:44 AM  Result Value Ref Range   WBC 7.0 4.0 - 10.5 K/uL   RBC 4.85 4.22 - 5.81 MIL/uL   Hemoglobin 14.0 13.0 - 17.0 g/dL   HCT 41.7 39.0 - 52.0 %   MCV 86.0 78.0 - 100.0 fL   MCH 28.9 26.0 - 34.0 pg   MCHC 33.6 30.0 - 36.0 g/dL   RDW 14.3 11.5 - 15.5 %   Platelets 168 150 - 400 K/uL  Protime-INR     Status: Abnormal   Collection Time: 09/23/14  9:44 AM  Result Value Ref Range   Prothrombin Time 26.0 (H) 11.6 - 15.2 seconds   INR 2.42 (H) 0.00 - 1.49  Lactate dehydrogenase     Status:  Abnormal   Collection Time: 09/23/14  9:44 AM  Result Value Ref Range   LDH 219 (H) 98 - 192 U/L  TSH     Status: Abnormal   Collection Time: 10/15/14  1:21 PM  Result Value Ref Range   TSH 0.08 (L) 0.35 - 4.50 uIU/mL  Protime-INR     Status: Abnormal   Collection Time: 10/15/14  1:21 PM  Result Value Ref Range   INR 3.6 (H) 0.8 - 1.0 ratio   Prothrombin Time 38.9 (H) 9.6 - 13.1 sec  ABO/Rh     Status: None   Collection Time: 10/23/14 11:02 AM  Result Value Ref Range   ABO/RH(D) O POS   TSH     Status: Abnormal   Collection Time: 10/23/14 11:15 AM  Result Value Ref Range   TSH 0.029 (L) 0.350 - 4.500 uIU/mL  T4, free     Status: Abnormal   Collection Time: 10/23/14 11:15 AM  Result Value Ref Range   Free T4 4.61 (H) 0.61 - 1.12 ng/dL  T3, free     Status: Abnormal   Collection Time: 10/23/14 11:15 AM  Result Value Ref Range   T3, Free 8.3 (H)  2.0 - 4.4 pg/mL    Comment: (NOTE) Performed At: [facility] [place], [place] [id number] Lindon Romp MD QT:[id number]   INR/PT     Status: Abnormal   Collection Time: 10/23/14 11:15 AM  Result Value Ref Range   Prothrombin Time 20.1 (H) 11.6 - 15.2 seconds   INR 1.72 (H) 0.00 - 1.49  INR/PT     Status: Abnormal   Collection Time: 10/30/14 12:30 PM  Result Value Ref Range   Prothrombin Time 32.3 (H) 11.6 - 15.2 seconds   INR 3.22 (H) 0.00 - 1.49  CBC     Status: Abnormal   Collection Time: 11/04/14  6:38 AM  Result Value Ref Range   WBC 6.5 4.0 - 10.5 K/uL   RBC 4.50 4.22 - 5.81 MIL/uL   Hemoglobin 12.7 (L) 13.0 - 17.0 g/dL   HCT 38.2 (L) 39.0 - 52.0 %   MCV 84.9 78.0 - 100.0 fL   MCH 28.2 26.0 - 34.0 pg   MCHC 33.2 30.0 - 36.0 g/dL   RDW 13.0 11.5 - 15.5 %   Platelets 150 150 - 400 K/uL  Basic metabolic panel     Status: Abnormal   Collection Time: 11/04/14  6:38 AM  Result Value Ref Range   Sodium 139 135 - 145 mmol/L   Potassium 3.6 3.5 - 5.1 mmol/L   Chloride 107 101 - 111 mmol/L   CO2  25 22 - 32 mmol/L   Glucose, Bld 97 65 - 99 mg/dL   BUN 15 6 - 20 mg/dL   Creatinine, Ser 1.17 0.61 - 1.24 mg/dL   Calcium 8.6 (L) 8.9 - 10.3 mg/dL   GFR calc non Af Amer >60 >60 mL/min   GFR calc Af Amer >60 >60 mL/min    Comment: (NOTE) The eGFR has been calculated using the CKD EPI equation. This calculation has not been validated in all clinical situations. eGFR's persistently <60 mL/min signify possible Chronic Kidney Disease.    Anion gap 7 5 - 15  Protime-INR     Status: Abnormal   Collection Time: 11/04/14  6:38 AM  Result Value Ref Range   Prothrombin Time 31.6 (H) 11.6 - 15.2 seconds   INR 3.12 (H) 0.00 - 1.49  I-STAT 3, venous blood gas (G3P V)     Status: Abnormal   Collection Time: 11/04/14  9:10 AM  Result Value Ref Range   pH, Ven 7.387 (H) 7.250 - 7.300   pCO2, Ven 42.4 (L) 45.0 - 50.0 mmHg   pO2, Ven 38.0 30.0 - 45.0 mmHg   Bicarbonate 25.5 (H) 20.0 - 24.0 mEq/L   TCO2 27 0 - 100 mmol/L   O2 Saturation 72.0 %   Sample type VENOUS    Comment NOTIFIED PHYSICIAN   I-STAT 3, venous blood gas (G3P V)     Status: Abnormal   Collection Time: 11/04/14  9:10 AM  Result Value Ref Range   pH, Ven 7.404 (H) 7.250 - 7.300   pCO2, Ven 38.9 (L) 45.0 - 50.0 mmHg   pO2, Ven 40.0 30.0 - 45.0 mmHg   Bicarbonate 24.3 (H) 20.0 - 24.0 mEq/L   TCO2 26 0 - 100 mmol/L   O2 Saturation 75.0 %   Sample type VENOUS   Lactate dehydrogenase     Status: Abnormal   Collection Time: 11/04/14 10:49 AM  Result Value Ref Range   LDH 228 (H) 98 - 192 U/L  INR/PT     Status:  Abnormal   Collection Time: 11/11/14  2:00 PM  Result Value Ref Range   Prothrombin Time 26.4 (H) 11.6 - 15.2 seconds   INR 2.47 (H) 0.00 - 1.49  HgB A1c     Status: None   Collection Time: 11/13/14 11:00 AM  Result Value Ref Range   Hgb A1c MFr Bld 5.6 4.8 - 5.6 %    Comment: (NOTE)         Pre-diabetes: 5.7 - 6.4         Diabetes: >6.4         Glycemic control for adults with diabetes: <7.0    Mean Plasma  Glucose 114 mg/dL    Comment: (NOTE) Performed At: [facility] [address] [place], [place] [id number] Lindon Romp MD WN:[id number]   Hepatitis B Core Antibody, total     Status: None   Collection Time: 11/13/14 11:00 AM  Result Value Ref Range   Hep B Core Total Ab Negative Negative    Comment: (NOTE) Performed At: [facility] [place], [place] [id number] Lindon Romp MD JI:[id number]   Hepatitis B Surface AntiBODY     Status: None   Collection Time: 11/13/14 11:00 AM  Result Value Ref Range   Hep B S Ab Reactive     Comment: (NOTE)              Non Reactive: Inconsistent with immunity,                            less than 10 mIU/mL              Reactive:     Consistent with immunity,                            greater than 9.9 mIU/mL Performed At: [facility] [place], [place] [id number] Lindon Romp MD DP:[id number]   Hepatitis B Surface AntiGEN     Status: None   Collection Time: 11/13/14 11:00 AM  Result Value Ref Range   Hepatitis B Surface Ag Negative Negative    Comment: (NOTE) Performed At: [facility] [address] [place], [place] [id number] Lindon Romp MD PY:[id number]   HIV antibody (with reflex)     Status: None   Collection Time: 11/13/14 11:00 AM  Result Value Ref Range   HIV Screen 4th Generation wRfx Non Reactive Non Reactive    Comment: (NOTE) Performed At: [facility] [place], [place] [id number] Lindon Romp MD JA:[id number]   Hepatitis C Antibody     Status: None   Collection Time: 11/13/14 11:00 AM  Result Value Ref Range   HCV Ab <0.1 0.0 - 0.9 s/co ratio    Comment: (NOTE)                                  Negative:     < 0.8                             Indeterminate: 0.8 - 0.9                                  Positive:     >  0.9 The CDC recommends that a positive HCV antibody result be followed up with a HCV Nucleic Acid  Amplification test (466599). Performed At: [facility] [address] [place], [place] [id number] Lindon Romp MD JQ:[id number]   Hepatitis A Antibody, IGM     Status: None   Collection Time: 11/13/14 11:00 AM  Result Value Ref Range   Hep A IgM Negative Negative    Comment: (NOTE) Performed At: [facility] [place], [place] [id number] Lindon Romp MD [id number]   RPR     Status: None   Collection Time: 11/13/14 11:00 AM  Result Value Ref Range   RPR Ser Ql Non Reactive Non Reactive    Comment: (NOTE) Performed At: [facility] [address] [place], [place] [id number] Lindon Romp MD WI:[id number]   Epstein-Barr virus VCA, IgG     Status: None   Collection Time: 11/13/14 11:00 AM  Result Value Ref Range   EBV VCA IgG <18.0 0.0 - 17.9 U/mL    Comment: (NOTE)                                 Negative        <18.0                                 Equivocal 18.0 - 21.9                                 Positive        >21.9 Performed At: [facility] [address] [place], [place] [id number] Lindon Romp MD EH:[id number]   Epstein-Barr virus VCA, IgM     Status: None   Collection Time: 11/13/14 11:00 AM  Result Value Ref Range   EBV VCA IgM <36.0 0.0 - 35.9 U/mL    Comment: (NOTE)                                 Negative        <36.0                                 Equivocal 36.0 - 43.9                                 Positive        >43.9 Performed At: [facility] [place], [place] [id number] Lindon Romp MD QX:[id number]   CMV IgM     Status: None   Collection Time: 11/13/14 11:00 AM  Result Value Ref Range   CMV IgM <30.0 0.0 - 29.9 AU/mL    Comment: (NOTE)                                Negative         <30.0                                Equivocal  30.0 - 34.9  Positive         >34.9 A positive result is generally indicative of  acute infection, reactivation or persistent IgM production. Performed At: [facility] [place], [place] [id number] Lindon Romp MD YP:[id number]   Cmv antibody, IgG (EIA)     Status: Abnormal   Collection Time: 11/13/14 11:00 AM  Result Value Ref Range   CMV Ab - IgG 4.90 (H) 0.00 - 0.59 U/mL    Comment: (NOTE)                               Negative          <0.60                               Equivocal   0.60 - 0.69                               Positive          >0.69 Performed At: [facility] [place], [place] [id number] Lindon Romp MD AS:[id number]   Varicella zoster antibody, IgG     Status: None   Collection Time: 11/13/14 11:00 AM  Result Value Ref Range   Varicella IgG 1851 Immune >165 index    Comment: (NOTE)                               Negative          <135                               Equivocal    135 - 165                               Positive          >165 A positive result generally indicates exposure to the pathogen or administration of specific immunoglobulins, but it is not indication of active infection or stage of disease. Performed At: [facility] [place], [place] [id number] Lindon Romp MD XB:[id number]   HSV 1 AND 2 IGM ABS, INDIRECT     Status: None   Collection Time: 11/13/14 11:00 AM  Result Value Ref Range   HSV 1 IgM Antibodies <1:10 <1:10 titer   HSV 2 IgM Antibodies <1:10 <1:10 titer    Comment: (NOTE) HSV 1 and HSV 2 share many cross-reacting antigens. Elevated titers to both HSV 1 and HSV 2 may represent crossreactive HSV antibodies rather than exposure to both HSV 1 and HSV 2. Results for this test are for research purposes only by the assay's manufacturer. The performance characteristics of this product have not been established.  Results should not be used as a diagnostic procedure without confirmation of the diagnosis by another  medically established diagnostic product or procedure. Performed At: [facility] [place], [place] [id number] Lindon Romp MD LN:[id number]   Toxoplasma gondii antibody, IgM     Status: None   Collection Time: 11/13/14 11:00 AM  Result Value Ref Range   Toxoplasma Antibody- IgM <3.0 0.0 - 7.9 AU/mL    Comment: (NOTE)  Negative            <8.0                             Equivocal      8.0 - 9.9                             Positive            >9.9 Performed At: [facility] [place], [place] [id number] Lindon Romp MD DJ:[id number]   Comment     Status: None   Collection Time: 11/13/14 11:00 AM  Result Value Ref Range   Comment Comment     Comment: (NOTE) It is presumed the patient has not been infected with and is not undergoing an acute infection with Toxoplasma. If symptoms persist, submit a new specimen after three weeks. Performed At: [facility] [place], [place] [id number] Lindon Romp MD OI:[id number]   Nicotine screen, urine     Status: None   Collection Time: 11/13/14 12:00 PM  Result Value Ref Range   Nicotine, urine < 2 ng/mL    Comment: (NOTE)  REFERENCE RANGES FOR NICOTINE:      Smokers: 200-700 ng/mL      Nonsmokers: <=17 ng/mL    Cotinine, Urine < 2 ng/mL    Comment: (NOTE)  REFERENCE RANGES FOR COTININE:      Smokers: 212 214 8238 ng/mL      Nonsmokers: <=20 ng/mL Performed at [facility]   Urine rapid drug screen (hosp performed)     Status: None   Collection Time: 11/15/14  3:29 PM  Result Value Ref Range   Opiates NONE DETECTED NONE DETECTED   Cocaine NONE DETECTED NONE DETECTED   Benzodiazepines NONE DETECTED NONE DETECTED   Amphetamines NONE DETECTED NONE DETECTED   Tetrahydrocannabinol NONE DETECTED NONE DETECTED   Barbiturates NONE DETECTED NONE DETECTED    Comment:        DRUG SCREEN FOR MEDICAL PURPOSES ONLY.  IF CONFIRMATION  IS NEEDED FOR ANY PURPOSE, NOTIFY LAB WITHIN 5 DAYS.        LOWEST DETECTABLE LIMITS FOR URINE DRUG SCREEN Drug Class       Cutoff (ng/mL) Amphetamine      1000 Barbiturate      200 Benzodiazepine   709 Tricyclics       628 Opiates          300 Cocaine          300 THC              50   Ethanol     Status: None   Collection Time: 11/18/14  3:00 PM  Result Value Ref Range   Alcohol, Ethyl (B) <5 <5 mg/dL    Comment:        LOWEST DETECTABLE LIMIT FOR SERUM ALCOHOL IS 5 mg/dL FOR MEDICAL PURPOSES ONLY   INR/PT     Status: Abnormal   Collection Time: 11/18/14  3:00 PM  Result Value Ref Range   Prothrombin Time 33.8 (H) 11.6 - 15.2 seconds   INR 3.43 (H) 0.00 - 1.49  Magnesium     Status: None   Collection Time: 11/18/14  3:00 PM  Result Value Ref Range   Magnesium 2.0 1.7 - 2.4 mg/dL  Basic metabolic panel     Status: Abnormal  Collection Time: 11/18/14  3:12 PM  Result Value Ref Range   Sodium 139 135 - 145 mmol/L   Potassium 4.1 3.5 - 5.1 mmol/L   Chloride 106 101 - 111 mmol/L   CO2 23 22 - 32 mmol/L   Glucose, Bld 117 (H) 65 - 99 mg/dL   BUN 15 6 - 20 mg/dL   Creatinine, Ser 1.47 (H) 0.61 - 1.24 mg/dL   Calcium 9.0 8.9 - 10.3 mg/dL   GFR calc non Af Amer 50 (L) >60 mL/min   GFR calc Af Amer 59 (L) >60 mL/min    Comment: (NOTE) The eGFR has been calculated using the CKD EPI equation. This calculation has not been validated in all clinical situations. eGFR's persistently <60 mL/min signify possible Chronic Kidney Disease.    Anion gap 10 5 - 15  Brain natriuretic peptide     Status: None   Collection Time: 11/28/14 10:32 AM  Result Value Ref Range   B Natriuretic Peptide 84.5 0.0 - 100.0 pg/mL  CBC     Status: None   Collection Time: 11/28/14 10:32 AM  Result Value Ref Range   WBC 9.3 4.0 - 10.5 K/uL   RBC 4.71 4.22 - 5.81 MIL/uL   Hemoglobin 13.2 13.0 - 17.0 g/dL   HCT 39.3 39.0 - 52.0 %   MCV 83.4 78.0 - 100.0 fL   MCH 28.0 26.0 - 34.0 pg   MCHC 33.6  30.0 - 36.0 g/dL   RDW 13.1 11.5 - 15.5 %   Platelets 190 150 - 400 K/uL  Protime-INR     Status: Abnormal   Collection Time: 11/28/14 10:32 AM  Result Value Ref Range   Prothrombin Time 33.9 (H) 11.6 - 15.2 seconds   INR 3.44 (H) 0.00 - 1.49  Lactate dehydrogenase     Status: Abnormal   Collection Time: 11/28/14 10:32 AM  Result Value Ref Range   LDH 203 (H) 98 - 192 U/L  Prealbumin     Status: None   Collection Time: 11/28/14 10:32 AM  Result Value Ref Range   Prealbumin 24.0 18 - 38 mg/dL  Comprehensive Metabolic Panel (CMET)     Status: Abnormal   Collection Time: 11/28/14 10:32 AM  Result Value Ref Range   Sodium 141 135 - 145 mmol/L   Potassium 3.6 3.5 - 5.1 mmol/L   Chloride 108 101 - 111 mmol/L   CO2 24 22 - 32 mmol/L   Glucose, Bld 88 65 - 99 mg/dL   BUN 12 6 - 20 mg/dL   Creatinine, Ser 1.10 0.61 - 1.24 mg/dL   Calcium 9.0 8.9 - 10.3 mg/dL   Total Protein 6.3 (L) 6.5 - 8.1 g/dL   Albumin 3.6 3.5 - 5.0 g/dL   AST 34 15 - 41 U/L   ALT 69 (H) 17 - 63 U/L   Alkaline Phosphatase 86 38 - 126 U/L   Total Bilirubin 0.9 0.3 - 1.2 mg/dL   GFR calc non Af Amer >60 >60 mL/min   GFR calc Af Amer >60 >60 mL/min    Comment: (NOTE) The eGFR has been calculated using the CKD EPI equation. This calculation has not been validated in all clinical situations. eGFR's persistently <60 mL/min signify possible Chronic Kidney Disease.    Anion gap 9 5 - 15  T4, free     Status: Abnormal   Collection Time: 11/28/14 10:32 AM  Result Value Ref Range   Free T4 4.78 (H) 0.61 - 1.12 ng/dL  T3     Status: Abnormal   Collection Time: 11/28/14 10:32 AM  Result Value Ref Range   T3, Total 187 (H) 71 - 180 ng/dL    Comment: (NOTE) Performed At: [facility] [place], [place] [id number] Lindon Romp MD [id number]   TSH     Status: Abnormal   Collection Time: 11/28/14 10:32 AM  Result Value Ref Range   TSH 0.034 (L) 0.350 - 4.500 uIU/mL  INR/PT     Status:  Abnormal   Collection Time: 12/06/14 10:30 AM  Result Value Ref Range   Prothrombin Time 17.5 (H) 11.6 - 15.2 seconds   INR 1.42 0.00 - 1.49  INR/PT     Status: Abnormal   Collection Time: 12/10/14  9:15 AM  Result Value Ref Range   Prothrombin Time 24.2 (H) 11.6 - 15.2 seconds   INR 2.19 (H) 0.00 - 4.97  Basic metabolic panel     Status: Abnormal   Collection Time: 12/10/14  9:15 AM  Result Value Ref Range   Sodium 138 135 - 145 mmol/L   Potassium 3.5 3.5 - 5.1 mmol/L   Chloride 107 101 - 111 mmol/L   CO2 23 22 - 32 mmol/L   Glucose, Bld 90 65 - 99 mg/dL   BUN 12 6 - 20 mg/dL   Creatinine, Ser 1.15 0.61 - 1.24 mg/dL   Calcium 8.8 (L) 8.9 - 10.3 mg/dL   GFR calc non Af Amer >60 >60 mL/min   GFR calc Af Amer >60 >60 mL/min    Comment: (NOTE) The eGFR has been calculated using the CKD EPI equation. This calculation has not been validated in all clinical situations. eGFR's persistently <60 mL/min signify possible Chronic Kidney Disease.    Anion gap 8 5 - 15  Magnesium     Status: None   Collection Time: 12/10/14  9:15 AM  Result Value Ref Range   Magnesium 2.0 1.7 - 2.4 mg/dL  Lactate dehydrogenase     Status: Abnormal   Collection Time: 12/10/14  9:15 AM  Result Value Ref Range   LDH 209 (H) 98 - 192 U/L    ASSESSMENT AND PLAN:   1) Palpitations - Unclear etiology. No PI events on VAD to suggest suction events - ICD interrogated personally and rare episodes of NSVT. We turned offf therapies for VT and lowered detection zone to help track 2) Chronic systolic HF, s/p HMII LVAD implant 05/2012. Currently 1B at [facility].  - He is improved with VAD speed down to 9600.  - Now dual listed at [facility] and [facility]. RHC looks great. We discussed the beating heart trial which will allow him to be eligible for organs form farther away.  - He is not on a BB. He was able to tolerate low-doseToprol before LVAD, however has been intolerant since that time  - Continue lisinopril, norvasc,  doxazosin, at current doses. Continue hydralazine,  Intolerant of Imdur d/t headaches.  - Reinforced the need and importance of daily weights, a low sodium diet, and fluid restriction (less than 2 L a day). Instructed to call the HF clinic if weight increases more than 3 lbs overnight or 5 lbs in a week.  3)  HTN-  - MAP up today but has not taken meds yet. Continue current regimen. 4) Hyperthyroidism - Follows with Robert [name]. 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 Amio stopped. Now on tapazole and prednisone.  5) NSVT/VT  - Stopped amio as above. Mexilitene started, ICD interrogated personally  as above.  6) PVCs - slightly increased off amio. Continue mexilitene. Hopefully will improve with treatment of hyperthyroidism. Check electrolytes. 7) Anticoagulation with coumadin  - Continue coumadin and ASA 325 mg for LVAD. INR low today. Will adjust. Will not attempt trial of lower VAD speed today to reduce palpitations as INR low.  8) LVAD  - Improved on 9600. Continue transplant process. Driveline site looks good.   Glori Bickers MD  10:11 PM

## 2014-12-10 NOTE — Addendum Note (Signed)
Encounter addended by: Dolores Patty, MD on: 12/10/2014 10:31 PM<BR>     Documentation filed: Notes Section

## 2014-12-10 NOTE — Addendum Note (Signed)
Encounter addended by: Dolores Patty, MD on: 12/10/2014 10:33 PM<BR>     Documentation filed: Follow-up Section, Notes Section

## 2014-12-17 ENCOUNTER — Ambulatory Visit (HOSPITAL_COMMUNITY): Payer: Self-pay | Admitting: Infectious Diseases

## 2014-12-17 ENCOUNTER — Encounter (HOSPITAL_COMMUNITY): Payer: Self-pay

## 2014-12-17 ENCOUNTER — Ambulatory Visit (HOSPITAL_COMMUNITY)
Admission: RE | Admit: 2014-12-17 | Discharge: 2014-12-17 | Disposition: A | Discharge status: Home / Self Care | Payer: Medicaid Other | Source: Ambulatory Visit | Attending: Internal Medicine | Admitting: Internal Medicine

## 2014-12-17 VITALS — BP 101/72

## 2014-12-17 DIAGNOSIS — Y831 Surgical operation with implant of artificial internal device as the cause of abnormal reaction of the patient, or of later complication, without mention of misadventure at the time of the procedure: Secondary | ICD-10-CM | POA: Diagnosis not present

## 2014-12-17 DIAGNOSIS — E0590 Thyrotoxicosis, unspecified without thyrotoxic crisis or storm: Secondary | ICD-10-CM

## 2014-12-17 DIAGNOSIS — Z95811 Presence of heart assist device: Secondary | ICD-10-CM

## 2014-12-17 DIAGNOSIS — I5022 Chronic systolic (congestive) heart failure: Secondary | ICD-10-CM | POA: Diagnosis not present

## 2014-12-17 DIAGNOSIS — Z7901 Long term (current) use of anticoagulants: Secondary | ICD-10-CM

## 2014-12-17 DIAGNOSIS — T829XXA Unspecified complication of cardiac and vascular prosthetic device, implant and graft, initial encounter: Secondary | ICD-10-CM | POA: Diagnosis not present

## 2014-12-17 DIAGNOSIS — R791 Abnormal coagulation profile: Secondary | ICD-10-CM

## 2014-12-17 LAB — PROTIME-INR
INR: 3.25 — ABNORMAL HIGH (ref 0.00–1.49)
PROTHROMBIN TIME: 32.5 s — AB (ref 11.6–15.2)

## 2014-12-17 MED ORDER — METHIMAZOLE 10 MG PO TABS: 20.0000 mg | ORAL_TABLET | Freq: Two times a day (BID) | ORAL | Status: DC

## 2014-12-17 NOTE — Addendum Note (Signed)
Encounter addended by: Blanchard Kelch, RN on: 12/17/2014  5:27 PM<BR>     Documentation filed: Notes Section, Problem List

## 2014-12-17 NOTE — Progress Notes (Addendum)
Device Interrogation 1 Week Check:   Robert [name] reports today for 1 week device interrogation after lowering detection to assess for slower arrhythmias that may be causing his symptoms of dizziness and SOB. Leta Jungling ([employer]) interrogated today and over last 7 days found one episode of slow VT rate 120 - 130s lasting 8 seconds. LVAD interrogated and not a/w PI events. He is only having rare PI's about 1 time a day/every other day. He is having some brief episodes of SVT/ST in the 130 rate. He is still complaining of dizziness that is relieved by sitting down/resting and shortness of breath/"hard breathing" even with minimal effort. Optivol shows fluid status is flat over last week. He is still taking Mexiletine 150 mg BID and PVC burden in the last week has dropped from 11.2 - 4.5.   D/W Dr. Mitzie Na decrease speed to 9400/8800 to see if that will help his symptoms and continue Mexiletine at the current dose. Garry Heater that if he should see any worsening of symptoms or swelling in legs/ankles/stomach he should report these to either [facility] or myself. Last RAMP speed study was done in 02/2014.   6 MW test completed 1580' with no rest periods. Able to converse afterward. HR appropriately ST 100s.   Neurocognitive Trailmaking completed 28m 5s correctly   QOL surveys completed at prior visit on 11/26/14.   INR supra-therapeutic today 3.23 after continuing 5 mg/day dose of Coumadin. See anticoag note for details. RTC in 1 week for INR check and brief meeting with LVAD RN to assess any change in symptoms.   Rexene Alberts, RN VAD Coordinator   Office: [phone] 24/7 VAD Pager: [phone]

## 2014-12-17 NOTE — Patient Instructions (Signed)
1. We decreased your speed to 9400 today and set your back up controller to reflect the same.   2. Call us if you have any worsening in dizziness, shortness of breath or swelling in your legs/stomach.   3. INR is 3.23   HOLD your coumadin today (Tuesday)  From now on you will take:    Wednesday 2.5 mg (1/2 pill)   Thursday 5 mg (1 pill)   Friday 5 mg (1 pill)   Saturday 2.5mg  (1/2 pill)   Sunday 5 mg (1 pill)   Monday 5 mg (1 pill)   Tuesday 5 mg (1 pill)  4. Come by Wednesday 12/25/14 for your INR

## 2014-12-19 ENCOUNTER — Other Ambulatory Visit (HOSPITAL_COMMUNITY): Payer: Medicaid Other

## 2014-12-24 ENCOUNTER — Ambulatory Visit (HOSPITAL_COMMUNITY): Payer: Self-pay | Admitting: *Deleted

## 2014-12-24 ENCOUNTER — Telehealth (HOSPITAL_COMMUNITY): Payer: Self-pay | Admitting: *Deleted

## 2014-12-24 ENCOUNTER — Other Ambulatory Visit (HOSPITAL_COMMUNITY): Payer: Self-pay | Admitting: *Deleted

## 2014-12-24 ENCOUNTER — Ambulatory Visit (HOSPITAL_COMMUNITY)
Admission: RE | Admit: 2014-12-24 | Discharge: 2014-12-24 | Disposition: A | Discharge status: Home / Self Care | Payer: Medicaid Other | Source: Ambulatory Visit | Attending: Cardiology | Admitting: Cardiology

## 2014-12-24 DIAGNOSIS — I5022 Chronic systolic (congestive) heart failure: Secondary | ICD-10-CM

## 2014-12-24 DIAGNOSIS — Z7901 Long term (current) use of anticoagulants: Secondary | ICD-10-CM

## 2014-12-24 DIAGNOSIS — Z95811 Presence of heart assist device: Secondary | ICD-10-CM | POA: Insufficient documentation

## 2014-12-24 DIAGNOSIS — Z5181 Encounter for therapeutic drug level monitoring: Secondary | ICD-10-CM | POA: Diagnosis not present

## 2014-12-24 LAB — PROTIME-INR
INR: 2.03 — AB (ref 0.00–1.49)
Prothrombin Time: 22.8 seconds — ABNORMAL HIGH (ref 11.6–15.2)

## 2014-12-24 NOTE — Telephone Encounter (Signed)
Pt called to report he was feeling bad after getting home.  Complaining of "warm sweating", dizziness when bending over, and "heart beating hard".  VAD parameters:  PI 5, Power 6.3, Flow 6.9. Pt admits to drinking water today, but not eating salt. Per Tonye Becket, NP - instructed pt to increase fluid intake and eat salty food.  Encouraged him to start drinking Gatorade instead of water as instructed at last few clinic visits per MD.  Asked pt to call if no improvement. Instructed him to report to ED and call VAD pager is symptoms worsen.  Pt verbalized understanding of same.

## 2014-12-25 ENCOUNTER — Other Ambulatory Visit (HOSPITAL_COMMUNITY): Payer: Medicaid Other

## 2015-01-07 ENCOUNTER — Other Ambulatory Visit (INDEPENDENT_AMBULATORY_CARE_PROVIDER_SITE_OTHER): Payer: Medicaid Other | Admitting: *Deleted

## 2015-01-07 ENCOUNTER — Ambulatory Visit: Payer: Self-pay | Admitting: *Deleted

## 2015-01-07 DIAGNOSIS — I472 Ventricular tachycardia: Secondary | ICD-10-CM | POA: Diagnosis not present

## 2015-01-07 DIAGNOSIS — I4729 Other ventricular tachycardia: Secondary | ICD-10-CM

## 2015-01-08 LAB — PROTIME-INR
INR: 1.94 — AB (ref <1.50)
PROTHROMBIN TIME: 22.4 s — AB (ref 11.6–15.2)

## 2015-01-13 ENCOUNTER — Other Ambulatory Visit (HOSPITAL_COMMUNITY): Payer: Self-pay | Admitting: Internal Medicine

## 2015-01-14 ENCOUNTER — Encounter: Payer: Self-pay | Admitting: Licensed Clinical Social Worker

## 2015-01-14 NOTE — Progress Notes (Signed)
CSW met with patient in the clinic on 01/13/15. Patient asking for community resources to assist with utility bills with cold weather approaching. Patient states he receives SSI and most of his income is taken with mortgage payment. CSW discussed budgeting and provided some resources for utility assistance. Patient will follow up and return call to CSW if further needs arise. Raquel Sarna, LCSW [phone]

## 2015-01-27 ENCOUNTER — Other Ambulatory Visit (HOSPITAL_COMMUNITY): Payer: Self-pay | Admitting: Infectious Diseases

## 2015-01-27 DIAGNOSIS — I5022 Chronic systolic (congestive) heart failure: Secondary | ICD-10-CM

## 2015-01-27 DIAGNOSIS — E0590 Thyrotoxicosis, unspecified without thyrotoxic crisis or storm: Secondary | ICD-10-CM

## 2015-01-27 DIAGNOSIS — Z7901 Long term (current) use of anticoagulants: Secondary | ICD-10-CM

## 2015-01-27 DIAGNOSIS — Z95811 Presence of heart assist device: Secondary | ICD-10-CM

## 2015-01-28 ENCOUNTER — Ambulatory Visit (HOSPITAL_COMMUNITY)
Admission: RE | Admit: 2015-01-28 | Discharge: 2015-01-28 | Disposition: A | Discharge status: Home / Self Care | Payer: Medicaid Other | Source: Ambulatory Visit | Attending: Internal Medicine | Admitting: Internal Medicine

## 2015-01-28 ENCOUNTER — Ambulatory Visit (HOSPITAL_COMMUNITY): Payer: Self-pay | Admitting: Infectious Diseases

## 2015-01-28 ENCOUNTER — Encounter: Payer: Self-pay | Admitting: Licensed Clinical Social Worker

## 2015-01-28 DIAGNOSIS — I5022 Chronic systolic (congestive) heart failure: Secondary | ICD-10-CM | POA: Diagnosis not present

## 2015-01-28 DIAGNOSIS — Z7901 Long term (current) use of anticoagulants: Secondary | ICD-10-CM

## 2015-01-28 DIAGNOSIS — I110 Hypertensive heart disease with heart failure: Secondary | ICD-10-CM | POA: Diagnosis not present

## 2015-01-28 DIAGNOSIS — Z79899 Other long term (current) drug therapy: Secondary | ICD-10-CM | POA: Diagnosis not present

## 2015-01-28 DIAGNOSIS — Z95811 Presence of heart assist device: Secondary | ICD-10-CM | POA: Insufficient documentation

## 2015-01-28 DIAGNOSIS — E0590 Thyrotoxicosis, unspecified without thyrotoxic crisis or storm: Secondary | ICD-10-CM | POA: Insufficient documentation

## 2015-01-28 DIAGNOSIS — R002 Palpitations: Secondary | ICD-10-CM | POA: Insufficient documentation

## 2015-01-28 DIAGNOSIS — Z7682 Awaiting organ transplant status: Secondary | ICD-10-CM | POA: Diagnosis not present

## 2015-01-28 DIAGNOSIS — Z7982 Long term (current) use of aspirin: Secondary | ICD-10-CM | POA: Diagnosis not present

## 2015-01-28 DIAGNOSIS — I493 Ventricular premature depolarization: Secondary | ICD-10-CM | POA: Diagnosis not present

## 2015-01-28 DIAGNOSIS — I272 Other secondary pulmonary hypertension: Secondary | ICD-10-CM | POA: Diagnosis not present

## 2015-01-28 DIAGNOSIS — R791 Abnormal coagulation profile: Secondary | ICD-10-CM

## 2015-01-28 LAB — BASIC METABOLIC PANEL
Anion gap: 8 (ref 5–15)
BUN: 10 mg/dL (ref 6–20)
CALCIUM: 9.2 mg/dL (ref 8.9–10.3)
CO2: 26 mmol/L (ref 22–32)
CREATININE: 1.08 mg/dL (ref 0.61–1.24)
Chloride: 107 mmol/L (ref 101–111)
Glucose, Bld: 85 mg/dL (ref 65–99)
Potassium: 3.6 mmol/L (ref 3.5–5.1)
SODIUM: 141 mmol/L (ref 135–145)

## 2015-01-28 LAB — TSH: TSH: 0.019 u[IU]/mL — AB (ref 0.350–4.500)

## 2015-01-28 LAB — LACTATE DEHYDROGENASE: LDH: 190 U/L (ref 98–192)

## 2015-01-28 LAB — CBC
HEMATOCRIT: 41.2 % (ref 39.0–52.0)
HEMOGLOBIN: 13.3 g/dL (ref 13.0–17.0)
MCH: 27.4 pg (ref 26.0–34.0)
MCHC: 32.3 g/dL (ref 30.0–36.0)
MCV: 84.8 fL (ref 78.0–100.0)
Platelets: 194 10*3/uL (ref 150–400)
RBC: 4.86 MIL/uL (ref 4.22–5.81)
RDW: 14.8 % (ref 11.5–15.5)
WBC: 6.7 10*3/uL (ref 4.0–10.5)

## 2015-01-28 LAB — PROTIME-INR
INR: 1.84 — ABNORMAL HIGH (ref 0.00–1.49)
Prothrombin Time: 21.2 seconds — ABNORMAL HIGH (ref 11.6–15.2)

## 2015-01-28 LAB — T4, FREE: FREE T4: 2.68 ng/dL — AB (ref 0.61–1.12)

## 2015-01-28 NOTE — Patient Instructions (Addendum)
1. INR only 1.83 today -- take ONE 5 mg pill every day now. Tonight ONLY take 1.5 pills (7.5 mg).   Come next week INR and BP   2. Take your Cardura ONE pill every day.   3. We will call you with your Thyroid results.   4. Return to clinic in 2 months.

## 2015-01-28 NOTE — Progress Notes (Signed)
CSW met with patient in the clinic to discuss referrals to community agencies. Patient reports he went to [employer] and [employer] for financial assistance with heating and power bills for the winter. Patient reports he completed a LIHEAP application and is hopeful for assistance this season to keep up with his bills. Patient appears to be in good spirits and positive about financial situation. CSW will continue to follow for supportive needs. Raquel Sarna, [place]

## 2015-01-28 NOTE — Progress Notes (Signed)
Symptom  Yes  No  Details   Angina       x Activity:   Claudication       x How far:   Syncope       x When: near syncopal event last Friday in drug store (bending over)EMS called  Stroke       x   Orthopnea       x How many pillows:  1 pillow  PND       x How often:  CPAP       N/A How many hrs:   Pedal edema       x   Abd fullness       x   N&V       x good appetite; intentional wt loss over last month  Diaphoresis       x When:  Bleeding      x   Urine color    light yellow  SOB       x Activity:   Palpitations        x      When: daily; increasing   ICD shock       x   Hospitlizaitons       x When/where/why:  ED visit       x When/where/why:  Other MD       x When/who/why:    Activity            decreased due to palpitations  Fluid      No limitations  Diet     Eating more salt than normal   Vital signs: HR:  83 MAP BP: 95 Auto cuff: 115/87 (106) O2 Sat: 99% Wt:  235.8 lbs Last wt:   239.4  lbs Ht:  6'2"  LVAD interrogation reveals:  Speed:  9390 Flow:  5.4 Power: 5.9 PI: 7.0 Alarms:  none Events:  0 - 2  PI events Fixed speed:  9600 Low speed limit:  9000  11V battery status: Primary controller: replace 32 months             Secondary controller:  replace 15 months   LVAD exit site:  Well healed and incorporated. The velour is fully implanted at exit site. Dressing dry and intact. No erythema or drainage. Stabilization device present and accurately applied. Driveline dressing is being changed q 3 - 5 days per sterile technique using Sorbaview dressing with biopatch on exit site. Pt does self dressing changes. Pt denies fever or chills.  Pt denies any alarms or VAD equipment issues. Pt is completing weekly and monthly maintenance for LVAD equipment. LVAD equipment check completed and is in good working order. Back-up equipment present. LVAD education done on emergency procedures and precautions and reviewed exit site care.  Encounter Details: Feeling much better  since dropped his speed to 9400 / 8800. Not experiencing further heart palpitations. Feels like he has had more fluid / salt lately, however he in the past has been sensitive to dehydration. Weight is down about 4 lbs and fluid looks stable today on current regimen.   TSH still low today--he has been out of his tapazole for some time. He missed an appointment with endocrinologist recently and has not rescheduled. Advised he needs to call and re-schedule an appointment since his labs are still significantly off.   INR 1.84 today. Goal 2 - 3 with FD ASA. See anticoag note for details.   MAP elevated 106--currently only taking  hydralazine 100 mg BID instead of TID. He is not taking his Cardura--> advised to take medications as prescribed with his pressure being out of control.   RTC in 1 week to recheck BP and INR and VAD clinic in 43m for routine OV.   Rexene Alberts, RN VAD Coordinator   Office: [phone] 24/7 VAD Pager: [phone]

## 2015-01-29 LAB — T3, FREE: T3, Free: 4.2 pg/mL (ref 2.0–4.4)

## 2015-02-01 NOTE — Progress Notes (Signed)
LVAD CLINIC NOTE  Patient ID: Robert [name], male   DOB: 20-Aug-1955, 59 y.o.   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 status post dual chamber Medtronic ICD implanted April 2011. Cath 2011 showed normal coronaries. He also has a history of VTach . Currently listed as 1b list for heart tx at [facility].   Had [place] on 11/04/14 as part of dual listing process at [facility]. Normal filling pressures and cardiac output. Amio recently stopped due to thyrotoxicity. Placed on prednisone and tapazole. Had VT off amio and mexilitene started.  He is here for routine f/u. Feeling much better with VAD speed down to 9600. Off amio and on mexilitene due to thyrotoxicity. Has been seeing Dr. Forde Dandy. On prednisone and tapazole. Feels good. Had a near syncopal episode last week when bending over but otherwise palpitations and presyncopal feelings much improved. No ICD shocks. No CP, SOB, edema. No bleeding. Dual listed at [facility].  Reports taking Coumadin as prescribed and adherence to anticoagulation based dietary restrictions.  Denies bright red blood per rectum or melena, no dark urine or hematuria.      LVAD interrogation reveals:  Speed: 9390 Flow: 5.4 Power: 5.9 PI: 7.0 Alarms: none Events: 0 - 2 PI events Fixed speed: 9600 Low speed limit: 9000  11V battery status: Primary controller: replace 32 months   Secondary controller: replace 15 months   I reviewed the LVAD parameters from today, and compared the results to the patient's prior recorded data.  No programming changes were made.  The LVAD is functioning within specified parameters.  The patient performs LVAD self-test daily.  LVAD interrogation was negative for any significant power changes, alarms or PI events/speed drops.  LVAD equipment  check completed and is in good working order.  Back-up equipment present.   LVAD education done on emergency procedures and precautions and reviewed exit site care.    Past Medical History  Diagnosis Date  . AICD (automatic cardioverter/defibrillator) present   . Hypertension   . Congestive heart failure   . Pneumonia   . Bronchitis   . Pulmonary hypertension     Current Outpatient Prescriptions  Medication Sig Dispense Refill  . allopurinol (ZYLOPRIM) 300 MG tablet Take 300 mg by mouth 2 (two) times a week.    Marland Kitchen amLODipine (NORVASC) 10 MG tablet Take 1 tablet (10 mg total) by mouth daily. 90 tablet 3  . aspirin EC 325 MG tablet Take 1 tablet (325 mg total) by mouth daily. 30 tablet 6  . atorvastatin (LIPITOR) 20 MG tablet Take 1 tablet (20 mg total) by mouth daily. 90 tablet 3  . enoxaparin (LOVENOX) 120 MG/0.8ML injection Inject 0.8 mLs (120 mg total) into the skin every 12 (twelve) hours. 10 Syringe 3  . hydrALAZINE (APRESOLINE) 100 MG tablet Take 1 tablet (100 mg total) by mouth 2 (two) times daily. 30 tablet 6  . lisinopril (PRINIVIL,ZESTRIL) 20 MG tablet Take 2 tablets (13m)in the morning, and 1 tablet (257m in the evening. 270 tablet 3  . mexiletine (MEXITIL) 150 MG capsule Take 1 capsule (150 mg total) by mouth 2 (two) times daily. 60 capsule 11  . pantoprazole (PROTONIX) 40 MG tablet Take 1 tablet (40 mg total) by mouth daily. 30 tablet 6  . warfarin (COUMADIN) 5 MG  tablet Take 5 mg by mouth daily. Take as directed for INR goal 2.0 - 3.0    . doxazosin (CARDURA) 4 MG tablet Take 4 mg by mouth daily.    . methimazole (TAPAZOLE) 10 MG tablet Take 2 tablets (20 mg total) by mouth 2 (two) times daily. (Patient not taking: Reported on 01/28/2015) 60 tablet 1  . predniSONE (DELTASONE) 10 MG tablet TAKE ONE TABLET BY MOUTH DAILY WITH BREAKFAST (Patient not taking: Reported on 01/28/2015) 30 tablet 0   No current facility-administered medications for this encounter.    Imdur and  Metoprolol  REVIEW OF SYSTEMS: All systems negative except as listed in HPI, PMH and Problem list.  Vital signs: HR: 83 MAP BP: 95 Auto cuff: 115/87 (106) O2 Sat: 99% Wt: 235.8 lbs Last wt: 239.4 lbs Ht: 6'2"  Physical Exam: GENERAL: Well appearing, male who presents to clinic today in no acute distress. HEENT: normal  NECK: Supple, JVP flat;  2+ bilaterally, no bruits. RIJ site stable after cath.  No lymphadenopathy or thyromegaly appreciated.   CARDIAC:  Mechanical heart sounds with LVAD hum present.  LUNGS:  Clear to auscultation bilaterally.  ABDOMEN:  Soft, round, nontender, positive bowel sounds x4.     LVAD exit site: well-healed and incorporated.  Dressing dry and intact.  No erythema or drainage.  Stabilization device present and accurately applied.  Driveline dressing is being changed daily per sterile technique.Driveline itself with multiple tears and twists EXTREMITIES:  Warm and dry, no cyanosis, clubbing, rash or edema  NEUROLOGIC:  Alert and oriented x 4.  Gait steady.  No aphasia.  No dysarthria.  Affect pleasant.     Recent Results (from the past 2160 hour(s))  CBC     Status: Abnormal   Collection Time: 11/04/14  6:38 AM  Result Value Ref Range   WBC 6.5 4.0 - 10.5 K/uL   RBC 4.50 4.22 - 5.81 MIL/uL   Hemoglobin 12.7 (L) 13.0 - 17.0 g/dL   HCT 38.2 (L) 39.0 - 52.0 %   MCV 84.9 78.0 - 100.0 fL   MCH 28.2 26.0 - 34.0 pg   MCHC 33.2 30.0 - 36.0 g/dL   RDW 13.0 11.5 - 15.5 %   Platelets 150 150 - 400 K/uL  Basic metabolic panel     Status: Abnormal   Collection Time: 11/04/14  6:38 AM  Result Value Ref Range   Sodium 139 135 - 145 mmol/L   Potassium 3.6 3.5 - 5.1 mmol/L   Chloride 107 101 - 111 mmol/L   CO2 25 22 - 32 mmol/L   Glucose, Bld 97 65 - 99 mg/dL   BUN 15 6 - 20 mg/dL   Creatinine, Ser 1.17 0.61 - 1.24 mg/dL   Calcium 8.6 (L) 8.9 - 10.3 mg/dL   GFR calc non Af Amer >60 >60 mL/min   GFR calc Af Amer >60 >60 mL/min    Comment: (NOTE) The  eGFR has been calculated using the CKD EPI equation. This calculation has not been validated in all clinical situations. eGFR's persistently <60 mL/min signify possible Chronic Kidney Disease.    Anion gap 7 5 - 15  Protime-INR     Status: Abnormal   Collection Time: 11/04/14  6:38 AM  Result Value Ref Range   Prothrombin Time 31.6 (H) 11.6 - 15.2 seconds   INR 3.12 (H) 0.00 - 1.49  I-STAT 3, venous blood gas (G3P V)     Status: Abnormal   Collection Time: 11/04/14  9:10 AM  Result Value Ref Range   pH, Ven 7.387 (H) 7.250 - 7.300   pCO2, Ven 42.4 (L) 45.0 - 50.0 mmHg   pO2, Ven 38.0 30.0 - 45.0 mmHg   Bicarbonate 25.5 (H) 20.0 - 24.0 mEq/L   TCO2 27 0 - 100 mmol/L   O2 Saturation 72.0 %   Sample type VENOUS    Comment NOTIFIED PHYSICIAN   I-STAT 3, venous blood gas (G3P V)     Status: Abnormal   Collection Time: 11/04/14  9:10 AM  Result Value Ref Range   pH, Ven 7.404 (H) 7.250 - 7.300   pCO2, Ven 38.9 (L) 45.0 - 50.0 mmHg   pO2, Ven 40.0 30.0 - 45.0 mmHg   Bicarbonate 24.3 (H) 20.0 - 24.0 mEq/L   TCO2 26 0 - 100 mmol/L   O2 Saturation 75.0 %   Sample type VENOUS   Lactate dehydrogenase     Status: Abnormal   Collection Time: 11/04/14 10:49 AM  Result Value Ref Range   LDH 228 (H) 98 - 192 U/L  INR/PT     Status: Abnormal   Collection Time: 11/11/14  2:00 PM  Result Value Ref Range   Prothrombin Time 26.4 (H) 11.6 - 15.2 seconds   INR 2.47 (H) 0.00 - 1.49  HgB A1c     Status: None   Collection Time: 11/13/14 11:00 AM  Result Value Ref Range   Hgb A1c MFr Bld 5.6 4.8 - 5.6 %    Comment: (NOTE)         Pre-diabetes: 5.7 - 6.4         Diabetes: >6.4         Glycemic control for adults with diabetes: <7.0    Mean Plasma Glucose 114 mg/dL    Comment: (NOTE) Performed At: [facility] [address] [place], [place] [id number] Lindon Romp MD [id number]   Hepatitis B Core Antibody, total     Status: None   Collection Time: 11/13/14 11:00 AM  Result  Value Ref Range   Hep B Core Total Ab Negative Negative    Comment: (NOTE) Performed At: [facility] [place], [place] [id number] Lindon Romp MD [id number]   Hepatitis B Surface AntiBODY     Status: None   Collection Time: 11/13/14 11:00 AM  Result Value Ref Range   Hep B S Ab Reactive     Comment: (NOTE)              Non Reactive: Inconsistent with immunity,                            less than 10 mIU/mL              Reactive:     Consistent with immunity,                            greater than 9.9 mIU/mL Performed At: [facility] [place], [place] [id number] Lindon Romp MD [id number]   Hepatitis B Surface AntiGEN     Status: None   Collection Time: 11/13/14 11:00 AM  Result Value Ref Range   Hepatitis B Surface Ag Negative Negative    Comment: (NOTE) Performed At: [facility] [address] [place], [place] [id number] Lindon Romp MD [id number]   HIV antibody (with reflex)     Status:  None   Collection Time: 11/13/14 11:00 AM  Result Value Ref Range   HIV Screen 4th Generation wRfx Non Reactive Non Reactive    Comment: (NOTE) Performed At: [facility] [place], [place] [id number] Lindon Romp MD [id number]   Hepatitis C Antibody     Status: None   Collection Time: 11/13/14 11:00 AM  Result Value Ref Range   HCV Ab <0.1 0.0 - 0.9 s/co ratio    Comment: (NOTE)                                  Negative:     < 0.8                             Indeterminate: 0.8 - 0.9                                  Positive:     > 0.9 The CDC recommends that a positive HCV antibody result be followed up with a HCV Nucleic Acid Amplification test (277824). Performed At: [facility] [address] [place], [place] [id number] Lindon Romp MD [id number]   Hepatitis A Antibody, IGM     Status: None   Collection Time: 11/13/14 11:00 AM  Result Value Ref Range    Hep A IgM Negative Negative    Comment: (NOTE) Performed At: [facility] [place], [place] [id number] Lindon Romp MD [id number]   RPR     Status: None   Collection Time: 11/13/14 11:00 AM  Result Value Ref Range   RPR Ser Ql Non Reactive Non Reactive    Comment: (NOTE) Performed At: [facility] [address] [place], [place] [id number] Lindon Romp MD [id number]   Epstein-Barr virus VCA, IgG     Status: None   Collection Time: 11/13/14 11:00 AM  Result Value Ref Range   EBV VCA IgG <18.0 0.0 - 17.9 U/mL    Comment: (NOTE)                                 Negative        <18.0                                 Equivocal 18.0 - 21.9                                 Positive        >21.9 Performed At: [facility] [address] [place], [place] [id number] Lindon Romp MD [id number]   Epstein-Barr virus VCA, IgM     Status: None   Collection Time: 11/13/14 11:00 AM  Result Value Ref Range   EBV VCA IgM <36.0 0.0 - 35.9 U/mL    Comment: (NOTE)                                 Negative        <36.0  Equivocal 36.0 - 43.9                                 Positive        >43.9 Performed At: [facility] [place], [place] [id number] Lindon Romp MD [id number]   CMV IgM     Status: None   Collection Time: 11/13/14 11:00 AM  Result Value Ref Range   CMV IgM <30.0 0.0 - 29.9 AU/mL    Comment: (NOTE)                                Negative         <30.0                                Equivocal  30.0 - 34.9                                Positive         >34.9 A positive result is generally indicative of acute infection, reactivation or persistent IgM production. Performed At: [facility] [place], [place] [id number] Lindon Romp MD [id number]   Cmv antibody, IgG (EIA)     Status: Abnormal   Collection Time: 11/13/14 11:00 AM    Result Value Ref Range   CMV Ab - IgG 4.90 (H) 0.00 - 0.59 U/mL    Comment: (NOTE)                               Negative          <0.60                               Equivocal   0.60 - 0.69                               Positive          >0.69 Performed At: [facility] [address] [place], [place] [id number] Lindon Romp MD [id number]   Varicella zoster antibody, IgG     Status: None   Collection Time: 11/13/14 11:00 AM  Result Value Ref Range   Varicella IgG 1851 Immune >165 index    Comment: (NOTE)                               Negative          <135                               Equivocal    135 - 165                               Positive          >165 A positive result generally indicates exposure to the pathogen or administration of specific immunoglobulins, but it is not indication of active infection  or stage of disease. Performed At: [facility] [place], [place] [id number] Lindon Romp MD [id number]   HSV 1 AND 2 IGM ABS, INDIRECT     Status: None   Collection Time: 11/13/14 11:00 AM  Result Value Ref Range   HSV 1 IgM Antibodies <1:10 <1:10 titer   HSV 2 IgM Antibodies <1:10 <1:10 titer    Comment: (NOTE) HSV 1 and HSV 2 share many cross-reacting antigens. Elevated titers to both HSV 1 and HSV 2 may represent crossreactive HSV antibodies rather than exposure to both HSV 1 and HSV 2. Results for this test are for research purposes only by the assay's manufacturer. The performance characteristics of this product have not been established.  Results should not be used as a diagnostic procedure without confirmation of the diagnosis by another medically established diagnostic product or procedure. Performed At: [facility] [address] [place], [place] [id number] Lindon Romp MD [id number]   Toxoplasma gondii antibody, IgM     Status: None   Collection Time: 11/13/14 11:00 AM  Result Value Ref  Range   Toxoplasma Antibody- IgM <3.0 0.0 - 7.9 AU/mL    Comment: (NOTE)                             Negative            <8.0                             Equivocal      8.0 - 9.9                             Positive            >9.9 Performed At: [facility] [place], [place] [id number] Lindon Romp MD [id number]   Comment     Status: None   Collection Time: 11/13/14 11:00 AM  Result Value Ref Range   Comment Comment     Comment: (NOTE) It is presumed the patient has not been infected with and is not undergoing an acute infection with Toxoplasma. If symptoms persist, submit a new specimen after three weeks. Performed At: [facility] [place], [place] [id number] Lindon Romp MD [id number]   Nicotine screen, urine     Status: None   Collection Time: 11/13/14 12:00 PM  Result Value Ref Range   Nicotine, urine < 2 ng/mL    Comment: (NOTE)  REFERENCE RANGES FOR NICOTINE:      Smokers: 200-700 ng/mL      Nonsmokers: <=17 ng/mL    Cotinine, Urine < 2 ng/mL    Comment: (NOTE)  REFERENCE RANGES FOR COTININE:      Smokers: [phone] ng/mL      Nonsmokers: <=20 ng/mL Performed at [facility]   Urine rapid drug screen (hosp performed)     Status: None   Collection Time: 11/15/14  3:29 PM  Result Value Ref Range   Opiates NONE DETECTED NONE DETECTED   Cocaine NONE DETECTED NONE DETECTED   Benzodiazepines NONE DETECTED NONE DETECTED   Amphetamines NONE DETECTED NONE DETECTED   Tetrahydrocannabinol NONE DETECTED NONE DETECTED   Barbiturates NONE DETECTED NONE DETECTED    Comment:        DRUG SCREEN FOR MEDICAL PURPOSES ONLY.  IF CONFIRMATION  IS NEEDED FOR ANY PURPOSE, NOTIFY LAB WITHIN 5 DAYS.        LOWEST DETECTABLE LIMITS FOR URINE DRUG SCREEN Drug Class       Cutoff (ng/mL) Amphetamine      1000 Barbiturate      200 Benzodiazepine   812 Tricyclics       751 Opiates          300 Cocaine           300 THC              50   Ethanol     Status: None   Collection Time: 11/18/14  3:00 PM  Result Value Ref Range   Alcohol, Ethyl (B) <5 <5 mg/dL    Comment:        LOWEST DETECTABLE LIMIT FOR SERUM ALCOHOL IS 5 mg/dL FOR MEDICAL PURPOSES ONLY   INR/PT     Status: Abnormal   Collection Time: 11/18/14  3:00 PM  Result Value Ref Range   Prothrombin Time 33.8 (H) 11.6 - 15.2 seconds   INR 3.43 (H) 0.00 - 1.49  Magnesium     Status: None   Collection Time: 11/18/14  3:00 PM  Result Value Ref Range   Magnesium 2.0 1.7 - 2.4 mg/dL  Basic metabolic panel     Status: Abnormal   Collection Time: 11/18/14  3:12 PM  Result Value Ref Range   Sodium 139 135 - 145 mmol/L   Potassium 4.1 3.5 - 5.1 mmol/L   Chloride 106 101 - 111 mmol/L   CO2 23 22 - 32 mmol/L   Glucose, Bld 117 (H) 65 - 99 mg/dL   BUN 15 6 - 20 mg/dL   Creatinine, Ser 1.47 (H) 0.61 - 1.24 mg/dL   Calcium 9.0 8.9 - 10.3 mg/dL   GFR calc non Af Amer 50 (L) >60 mL/min   GFR calc Af Amer 59 (L) >60 mL/min    Comment: (NOTE) The eGFR has been calculated using the CKD EPI equation. This calculation has not been validated in all clinical situations. eGFR's persistently <60 mL/min signify possible Chronic Kidney Disease.    Anion gap 10 5 - 15  Brain natriuretic peptide     Status: None   Collection Time: 11/28/14 10:32 AM  Result Value Ref Range   B Natriuretic Peptide 84.5 0.0 - 100.0 pg/mL  CBC     Status: None   Collection Time: 11/28/14 10:32 AM  Result Value Ref Range   WBC 9.3 4.0 - 10.5 K/uL   RBC 4.71 4.22 - 5.81 MIL/uL   Hemoglobin 13.2 13.0 - 17.0 g/dL   HCT 39.3 39.0 - 52.0 %   MCV 83.4 78.0 - 100.0 fL   MCH 28.0 26.0 - 34.0 pg   MCHC 33.6 30.0 - 36.0 g/dL   RDW 13.1 11.5 - 15.5 %   Platelets 190 150 - 400 K/uL  Protime-INR     Status: Abnormal   Collection Time: 11/28/14 10:32 AM  Result Value Ref Range   Prothrombin Time 33.9 (H) 11.6 - 15.2 seconds   INR 3.44 (H) 0.00 - 1.49  Lactate dehydrogenase      Status: Abnormal   Collection Time: 11/28/14 10:32 AM  Result Value Ref Range   LDH 203 (H) 98 - 192 U/L  Prealbumin     Status: None   Collection Time: 11/28/14 10:32 AM  Result Value Ref Range   Prealbumin 24.0 18 - 38 mg/dL  Comprehensive Metabolic  Panel (CMET)     Status: Abnormal   Collection Time: 11/28/14 10:32 AM  Result Value Ref Range   Sodium 141 135 - 145 mmol/L   Potassium 3.6 3.5 - 5.1 mmol/L   Chloride 108 101 - 111 mmol/L   CO2 24 22 - 32 mmol/L   Glucose, Bld 88 65 - 99 mg/dL   BUN 12 6 - 20 mg/dL   Creatinine, Ser 1.10 0.61 - 1.24 mg/dL   Calcium 9.0 8.9 - 10.3 mg/dL   Total Protein 6.3 (L) 6.5 - 8.1 g/dL   Albumin 3.6 3.5 - 5.0 g/dL   AST 34 15 - 41 U/L   ALT 69 (H) 17 - 63 U/L   Alkaline Phosphatase 86 38 - 126 U/L   Total Bilirubin 0.9 0.3 - 1.2 mg/dL   GFR calc non Af Amer >60 >60 mL/min   GFR calc Af Amer >60 >60 mL/min    Comment: (NOTE) The eGFR has been calculated using the CKD EPI equation. This calculation has not been validated in all clinical situations. eGFR's persistently <60 mL/min signify possible Chronic Kidney Disease.    Anion gap 9 5 - 15  T4, free     Status: Abnormal   Collection Time: 11/28/14 10:32 AM  Result Value Ref Range   Free T4 4.78 (H) 0.61 - 1.12 ng/dL  T3     Status: Abnormal   Collection Time: 11/28/14 10:32 AM  Result Value Ref Range   T3, Total 187 (H) 71 - 180 ng/dL    Comment: (NOTE) Performed At: [facility] [place], [place] [id number] Lindon Romp MD [id number]   TSH     Status: Abnormal   Collection Time: 11/28/14 10:32 AM  Result Value Ref Range   TSH 0.034 (L) 0.350 - 4.500 uIU/mL  INR/PT     Status: Abnormal   Collection Time: 12/06/14 10:30 AM  Result Value Ref Range   Prothrombin Time 17.5 (H) 11.6 - 15.2 seconds   INR 1.42 0.00 - 1.49  INR/PT     Status: Abnormal   Collection Time: 12/10/14  9:15 AM  Result Value Ref Range   Prothrombin Time 24.2 (H) 11.6 -  15.2 seconds   INR 2.19 (H) 0.00 - 0.94  Basic metabolic panel     Status: Abnormal   Collection Time: 12/10/14  9:15 AM  Result Value Ref Range   Sodium 138 135 - 145 mmol/L   Potassium 3.5 3.5 - 5.1 mmol/L   Chloride 107 101 - 111 mmol/L   CO2 23 22 - 32 mmol/L   Glucose, Bld 90 65 - 99 mg/dL   BUN 12 6 - 20 mg/dL   Creatinine, Ser 1.15 0.61 - 1.24 mg/dL   Calcium 8.8 (L) 8.9 - 10.3 mg/dL   GFR calc non Af Amer >60 >60 mL/min   GFR calc Af Amer >60 >60 mL/min    Comment: (NOTE) The eGFR has been calculated using the CKD EPI equation. This calculation has not been validated in all clinical situations. eGFR's persistently <60 mL/min signify possible Chronic Kidney Disease.    Anion gap 8 5 - 15  Magnesium     Status: None   Collection Time: 12/10/14  9:15 AM  Result Value Ref Range   Magnesium 2.0 1.7 - 2.4 mg/dL  Lactate dehydrogenase     Status: Abnormal   Collection Time: 12/10/14  9:15 AM  Result Value Ref Range   LDH 209 (H) 98 -  192 U/L  INR/PT     Status: Abnormal   Collection Time: 12/17/14  9:15 AM  Result Value Ref Range   Prothrombin Time 32.5 (H) 11.6 - 15.2 seconds   INR 3.25 (H) 0.00 - 1.49  INR/PT     Status: Abnormal   Collection Time: 12/24/14  9:30 AM  Result Value Ref Range   Prothrombin Time 22.8 (H) 11.6 - 15.2 seconds   INR 2.03 (H) 0.00 - 1.49  INR/PT     Status: Abnormal   Collection Time: 01/07/15 11:39 AM  Result Value Ref Range   Prothrombin Time 22.4 (H) 11.6 - 15.2 seconds   INR 1.94 (H) <1.50    Comment: The INR is of principal utility in following patients on stable doses of oral anticoagulants.  The therapeutic range is generally 2.0 to 3.0, but may be 3.0 to 4.0 in patients with mechanical cardiac valves, recurrent embolisms and antiphospholipid antibodies (including lupus inhibitors).   Basic metabolic panel     Status: None   Collection Time: 01/28/15 11:05 AM  Result Value Ref Range   Sodium 141 135 - 145 mmol/L   Potassium  3.6 3.5 - 5.1 mmol/L   Chloride 107 101 - 111 mmol/L   CO2 26 22 - 32 mmol/L   Glucose, Bld 85 65 - 99 mg/dL   BUN 10 6 - 20 mg/dL   Creatinine, Ser 1.08 0.61 - 1.24 mg/dL   Calcium 9.2 8.9 - 10.3 mg/dL   GFR calc non Af Amer >60 >60 mL/min   GFR calc Af Amer >60 >60 mL/min    Comment: (NOTE) The eGFR has been calculated using the CKD EPI equation. This calculation has not been validated in all clinical situations. eGFR's persistently <60 mL/min signify possible Chronic Kidney Disease.    Anion gap 8 5 - 15  T4, free     Status: Abnormal   Collection Time: 01/28/15 11:05 AM  Result Value Ref Range   Free T4 2.68 (H) 0.61 - 1.12 ng/dL  CBC     Status: None   Collection Time: 01/28/15 11:05 AM  Result Value Ref Range   WBC 6.7 4.0 - 10.5 K/uL   RBC 4.86 4.22 - 5.81 MIL/uL   Hemoglobin 13.3 13.0 - 17.0 g/dL   HCT 41.2 39.0 - 52.0 %   MCV 84.8 78.0 - 100.0 fL   MCH 27.4 26.0 - 34.0 pg   MCHC 32.3 30.0 - 36.0 g/dL   RDW 14.8 11.5 - 15.5 %   Platelets 194 150 - 400 K/uL  Protime-INR     Status: Abnormal   Collection Time: 01/28/15 11:05 AM  Result Value Ref Range   Prothrombin Time 21.2 (H) 11.6 - 15.2 seconds   INR 1.84 (H) 0.00 - 1.49  Lactate dehydrogenase     Status: None   Collection Time: 01/28/15 11:05 AM  Result Value Ref Range   LDH 190 98 - 192 U/L  TSH     Status: Abnormal   Collection Time: 01/28/15 11:06 AM  Result Value Ref Range   TSH 0.019 (L) 0.350 - 4.500 uIU/mL  T3, free     Status: None   Collection Time: 01/28/15 11:06 AM  Result Value Ref Range   T3, Free 4.2 2.0 - 4.4 pg/mL    Comment: (NOTE) Performed At: [facility] [address] [place], [place] [id number] Lindon Romp MD [id number]     ASSESSMENT AND PLAN:   1) Palpitations - Improve. At  last visit we turned offf therapies for VT and lowered detection zone to help track 2) Chronic systolic HF, s/p HMII LVAD implant 05/2012. Currently 1B at [facility].  - He is improved with  VAD speed down to 9600.  - Now dual listed at [facility]. RHC looks great. We discussed the beating heart trial which will allow him to be eligible for organs form farther away.  - He is not on a BB. He was able to tolerate low-doseToprol before LVAD, however has been intolerant since that time  - Continue lisinopril, norvasc, doxazosin, at current doses. Continue hydralazine,  Intolerant of Imdur d/t headaches.  - Reinforced the need and importance of daily weights, a low sodium diet, and fluid restriction (less than 2 L a day). Instructed to call the HF clinic if weight increases more than 3 lbs overnight or 5 lbs in a week.  3)  HTN-  - MAP ok. Continue current regimen. 4) Hyperthyroidism - Follows with Dr. Forde Dandy. 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 Amio stopped. Now on tapazole and prednisone.  5) NSVT/VT  - Off amio. On mexilitene. 6) PVCs - Imptoving with treatment of hyperthyroidism. Check electrolytes. 7) Anticoagulation with coumadin  - Continue coumadin and ASA 325 mg for LVAD. 8) LVAD  - Improved on 9600. Continue transplant process. Driveline site looks good.   Glori Bickers MD  10:08 PM

## 2015-02-04 ENCOUNTER — Encounter: Payer: Self-pay | Admitting: *Deleted

## 2015-02-05 ENCOUNTER — Ambulatory Visit (HOSPITAL_COMMUNITY): Payer: Self-pay | Admitting: Infectious Diseases

## 2015-02-05 ENCOUNTER — Encounter: Payer: Self-pay | Admitting: Licensed Clinical Social Worker

## 2015-02-05 ENCOUNTER — Ambulatory Visit (HOSPITAL_COMMUNITY)
Admission: RE | Admit: 2015-02-05 | Discharge: 2015-02-05 | Disposition: A | Discharge status: Home / Self Care | Payer: Medicaid Other | Source: Ambulatory Visit | Attending: Cardiology | Admitting: Cardiology

## 2015-02-05 ENCOUNTER — Encounter (HOSPITAL_COMMUNITY): Payer: Self-pay | Admitting: Infectious Diseases

## 2015-02-05 DIAGNOSIS — I5022 Chronic systolic (congestive) heart failure: Secondary | ICD-10-CM

## 2015-02-05 DIAGNOSIS — Z7901 Long term (current) use of anticoagulants: Secondary | ICD-10-CM | POA: Diagnosis not present

## 2015-02-05 DIAGNOSIS — R791 Abnormal coagulation profile: Secondary | ICD-10-CM | POA: Diagnosis present

## 2015-02-05 DIAGNOSIS — I10 Essential (primary) hypertension: Secondary | ICD-10-CM

## 2015-02-05 LAB — PROTIME-INR
INR: 2.14 — ABNORMAL HIGH (ref 0.00–1.49)
Prothrombin Time: 23.7 seconds — ABNORMAL HIGH (ref 11.6–15.2)

## 2015-02-05 MED ORDER — DOXAZOSIN MESYLATE 8 MG PO TABS: 8.0000 mg | ORAL_TABLET | Freq: Every day | ORAL | Status: DC

## 2015-02-05 NOTE — Progress Notes (Signed)
CSW met with patient in the clinic to discuss  Concerns about PCP. Patient has been assigned to PCP provider although has been unable to obtain appointment. Patient reports he has received medical bills from transplant site due to lack of PCP provider. CSW encouraged patient to stop by [facility] (assigned PCP) to explain situation and request appointment. Patient later called stating he has an appointment with PCP for this month. Patient also requested to contact billing office at transplant site to follow up on outstanding bills. CSW will follow for support as needed. Raquel Sarna, [place]

## 2015-02-05 NOTE — Progress Notes (Signed)
BP still elevated after resuming Cardura 4 mg QD. Not compliant with Hydralazine ("takes 1 - 2 times a day"). D/W Dr. Gala Romney and will increase Cardura to 8 mg daily and see back in 2 weeks for BP check and INR.   Rexene Alberts, RN VAD Coordinator   Office: [phone] 24/7 VAD Pager: [phone]

## 2015-02-07 ENCOUNTER — Encounter: Payer: Self-pay | Admitting: Student

## 2015-02-10 ENCOUNTER — Telehealth: Payer: Self-pay | Admitting: Infectious Diseases

## 2015-02-10 NOTE — Telephone Encounter (Signed)
Called re: instructions for cardura, next appointment for BP check with labs and to deliver a message from [facility] to call back Britta Mccreedy at [phone] regarding insurance for heart transplant.   Requested call back to confirm receipt.

## 2015-02-14 ENCOUNTER — Other Ambulatory Visit (HOSPITAL_COMMUNITY): Payer: Self-pay | Admitting: *Deleted

## 2015-02-14 ENCOUNTER — Encounter (HOSPITAL_COMMUNITY): Payer: Self-pay | Admitting: Infectious Diseases

## 2015-02-14 DIAGNOSIS — Z7901 Long term (current) use of anticoagulants: Secondary | ICD-10-CM

## 2015-02-14 DIAGNOSIS — Z95811 Presence of heart assist device: Secondary | ICD-10-CM

## 2015-02-14 NOTE — Progress Notes (Signed)
Call received from Clide Dales from [facility] Heart Transplant team requesting recent serologies so they may submit new packet to [facility] after recent insurance change. Labs from 11/13/14 - 11/15/14 all sent to [phone].

## 2015-02-18 ENCOUNTER — Ambulatory Visit (HOSPITAL_COMMUNITY)
Admission: RE | Admit: 2015-02-18 | Discharge: 2015-02-18 | Disposition: A | Discharge status: Home / Self Care | Payer: Medicaid Other | Source: Ambulatory Visit | Attending: Internal Medicine | Admitting: Internal Medicine

## 2015-02-18 ENCOUNTER — Telehealth (HOSPITAL_COMMUNITY): Payer: Self-pay | Admitting: *Deleted

## 2015-02-18 ENCOUNTER — Ambulatory Visit (HOSPITAL_COMMUNITY): Payer: Self-pay | Admitting: *Deleted

## 2015-02-18 DIAGNOSIS — Z95811 Presence of heart assist device: Secondary | ICD-10-CM

## 2015-02-18 DIAGNOSIS — Z5181 Encounter for therapeutic drug level monitoring: Secondary | ICD-10-CM | POA: Insufficient documentation

## 2015-02-18 DIAGNOSIS — Z7901 Long term (current) use of anticoagulants: Secondary | ICD-10-CM | POA: Diagnosis not present

## 2015-02-18 LAB — PROTIME-INR
INR: 2.18 — ABNORMAL HIGH (ref 0.00–1.49)
Prothrombin Time: 24.1 seconds — ABNORMAL HIGH (ref 11.6–15.2)

## 2015-02-18 NOTE — Addendum Note (Signed)
Encounter addended by: Levonne Spiller, RN on: 02/18/2015  2:56 PM<BR>     Documentation filed: Vitals Section

## 2015-02-18 NOTE — Addendum Note (Signed)
Encounter addended by: Levonne Spiller, RN on: 02/18/2015  4:30 PM<BR>     Documentation filed: Notes Section

## 2015-02-18 NOTE — Progress Notes (Signed)
Pt here for BP check and INR.   Doppler modified systolic:  128 Auto cuff:  127/92 (104)  Pt admits to taking only taking Cardura 4 mg daily; thinks it causes dizziness. Will review with Dr. Gala Romney and call pt with instructions.

## 2015-02-18 NOTE — Addendum Note (Signed)
Encounter addended by: Levonne Spiller, RN on: 02/18/2015  4:43 PM<BR>     Documentation filed: Follow-up Section, LOS Section

## 2015-02-18 NOTE — Addendum Note (Signed)
Encounter addended by: Levonne Spiller, RN on: 02/18/2015  3:29 PM<BR>     Documentation filed: Vitals Section, Notes Section

## 2015-02-18 NOTE — Telephone Encounter (Signed)
Called pt per Dr. Gala Romney and left message re: med changes.  Instructed him to try taking Cardura 4 mg at bedtime. If he still has dizziness, may decrease to 2 mg at bedtime. Also, instructed to take Hydralazine 100 mg three times daily.  Will re-check BP in two weeks if pt makes medication changes as ordered. Asked pt to call me if any questions or concerns.

## 2015-02-25 ENCOUNTER — Ambulatory Visit (INDEPENDENT_AMBULATORY_CARE_PROVIDER_SITE_OTHER): Payer: Medicaid Other | Admitting: Student

## 2015-02-25 ENCOUNTER — Encounter: Payer: Self-pay | Admitting: Student

## 2015-02-25 VITALS — BP 118/80 | HR 78 | Temp 97.6°F | Ht 74.0 in | Wt 249.0 lb

## 2015-02-25 DIAGNOSIS — Z23 Encounter for immunization: Secondary | ICD-10-CM | POA: Diagnosis not present

## 2015-02-25 DIAGNOSIS — Z0000 Encounter for general adult medical examination without abnormal findings: Secondary | ICD-10-CM | POA: Insufficient documentation

## 2015-02-25 DIAGNOSIS — E0590 Thyrotoxicosis, unspecified without thyrotoxic crisis or storm: Secondary | ICD-10-CM

## 2015-02-25 NOTE — Patient Instructions (Signed)
It was great seeing you today! We have addressed the following issues today  1. Staying health: I would like you to be more active (exercise) and eat healthy (vegitables and fruits) 2. Vaccines: we gave your flu and tetanus vaccines.    If we did any lab work today, and the results require attention, either me or my nurse will get in touch with you. Otherwise, I look forward to talking with you again at our next visit. If you have any questions or concerns before then, please call the clinic at [phone].  Please bring all your medications to every doctors visit   Sign up for My Chart to have easy access to your labs results, and communication with your Primary care physician.    Please check-out at the front desk before leaving the clinic.   Take Care,

## 2015-02-25 NOTE — Progress Notes (Signed)
   Subjective:    Patient ID: Robert [name], male    DOB: March 26, 1956, 59 y.o.   MRN: [id number] HPI Here to have a primary care provider. Also like to get his flu and tetanus shot. Denies any concern today. He has history of systolic HF (echo in 09/2014 with EF of 20%). He has had LVAD since 2014. 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  He sees Dr. Gala Romney for HF and VT. He is also on warfarin 5 mg. Therapeutic INR about 6 days ago. He also has history of hyperthyroidism, and is on methimazole. His TSH is low. His free T4 is elevated but down trending. He sees Dr. Evlyn Kanner (an endocrinologist). He saw him last 3 months ago, and has another appointment in 8 weeks.   He is compliant with his medications. Denies chest pain or shortness of breath. He exercises by walking and riding stationary bicycle. His weight has been stable. Denies smoking, drinking or using drugs. He is uptodate with his preventive health. States that he is safe in his relationship. Denies any stress.   Review of Systems Twelve point review of system negative except for what was mentioned in HPI.     Objective:   Physical Exam Gen: well-appearing, pleasant  CV: heart sounds hard to hear with LVAD. Resp: no apparent WOB, CTAB. Abd: +BS. Soft, NDNT, no rebound or guarding.  Ext: No edema or gross deformities.    Assessment & Plan:  Annual physical exam Patient with hyperthyroidism and complex cardiac history otherwise doing well. Patient is listed for heart transplant. He has been followed by Dr. Gala Romney for HF and VT. He is also on warfarin 5 mg. Therapeutic INR about 6 days ago. He also has history of hyperthyroidism. He is on methimazole. He sees Dr. Evlyn Kanner and has another appointment in 8 weeks.  Patient is uptodate with his health screenings and immunizations. He denies smoking, drinking EtOH or recreational drug use. Discussed about eating healthy and exercise. He has been walking and working on his stationary  bicycle. His weight has been stable.    Hyperthyroidism Patients with high free T4 and low TSH. However, his free T4 has been down trending. He is on methimazole. He sees Dr. Evlyn Kanner (Endocrinologist) and has an appointment with him in 8 weeks. His weight has been stable.  -I would defer this to his endocrinologist for now and follow along.

## 2015-02-25 NOTE — Assessment & Plan Note (Addendum)
Patient with hyperthyroidism and complex cardiac history otherwise doing well. Patient is listed for heart transplant. He has been followed by Dr. Gala Romney for HF and VT. He is also on warfarin 5 mg. Therapeutic INR about 6 days ago. He also has history of hyperthyroidism. He is on methimazole. He sees Dr. Evlyn Kanner and has another appointment in 8 weeks.  Patient is uptodate with his health screenings and immunizations. He denies smoking, drinking EtOH or recreational drug use. Discussed about eating healthy and exercise. He has been walking and working on his stationary bicycle. His weight has been stable.

## 2015-02-25 NOTE — Assessment & Plan Note (Signed)
Patients with high free T4 and low TSH. However, his free T4 has been down trending. He is on methimazole. He sees Dr. Evlyn Kanner (Endocrinologist) and has an appointment with him in 8 weeks. His weight has been stable.  -I would defer this to his endocrinologist for now and follow along.

## 2015-03-04 ENCOUNTER — Other Ambulatory Visit (HOSPITAL_COMMUNITY): Payer: Self-pay | Admitting: *Deleted

## 2015-03-04 ENCOUNTER — Ambulatory Visit (HOSPITAL_COMMUNITY): Payer: Self-pay | Admitting: *Deleted

## 2015-03-04 ENCOUNTER — Ambulatory Visit (HOSPITAL_COMMUNITY)
Admission: RE | Admit: 2015-03-04 | Discharge: 2015-03-04 | Disposition: A | Discharge status: Home / Self Care | Payer: Medicaid Other | Source: Ambulatory Visit | Attending: Internal Medicine | Admitting: Internal Medicine

## 2015-03-04 DIAGNOSIS — Z95811 Presence of heart assist device: Secondary | ICD-10-CM

## 2015-03-04 DIAGNOSIS — I493 Ventricular premature depolarization: Secondary | ICD-10-CM | POA: Insufficient documentation

## 2015-03-04 DIAGNOSIS — R002 Palpitations: Secondary | ICD-10-CM | POA: Diagnosis not present

## 2015-03-04 DIAGNOSIS — I272 Other secondary pulmonary hypertension: Secondary | ICD-10-CM | POA: Insufficient documentation

## 2015-03-04 DIAGNOSIS — Z7901 Long term (current) use of anticoagulants: Secondary | ICD-10-CM | POA: Diagnosis not present

## 2015-03-04 DIAGNOSIS — I5022 Chronic systolic (congestive) heart failure: Secondary | ICD-10-CM | POA: Insufficient documentation

## 2015-03-04 DIAGNOSIS — Z7982 Long term (current) use of aspirin: Secondary | ICD-10-CM | POA: Diagnosis not present

## 2015-03-04 DIAGNOSIS — Z79899 Other long term (current) drug therapy: Secondary | ICD-10-CM | POA: Insufficient documentation

## 2015-03-04 DIAGNOSIS — I472 Ventricular tachycardia: Secondary | ICD-10-CM | POA: Diagnosis not present

## 2015-03-04 DIAGNOSIS — E0590 Thyrotoxicosis, unspecified without thyrotoxic crisis or storm: Secondary | ICD-10-CM

## 2015-03-04 DIAGNOSIS — I10 Essential (primary) hypertension: Secondary | ICD-10-CM | POA: Diagnosis not present

## 2015-03-04 DIAGNOSIS — Z95810 Presence of automatic (implantable) cardiac defibrillator: Secondary | ICD-10-CM | POA: Diagnosis not present

## 2015-03-04 DIAGNOSIS — I429 Cardiomyopathy, unspecified: Secondary | ICD-10-CM | POA: Diagnosis not present

## 2015-03-04 LAB — BASIC METABOLIC PANEL
Anion gap: 6 (ref 5–15)
BUN: 9 mg/dL (ref 6–20)
CALCIUM: 8.8 mg/dL — AB (ref 8.9–10.3)
CO2: 27 mmol/L (ref 22–32)
CREATININE: 1.11 mg/dL (ref 0.61–1.24)
Chloride: 107 mmol/L (ref 101–111)
GFR calc Af Amer: >60 mL/min (ref >60)
GFR calc non Af Amer: >60 mL/min (ref >60)
GLUCOSE: 90 mg/dL (ref 65–99)
Potassium: 4.2 mmol/L (ref 3.5–5.1)
Sodium: 140 mmol/L (ref 135–145)

## 2015-03-04 LAB — PROTIME-INR
INR: 2.08 — ABNORMAL HIGH (ref 0.00–1.49)
PROTHROMBIN TIME: 23.2 s — AB (ref 11.6–15.2)

## 2015-03-04 MED ORDER — SACUBITRIL-VALSARTAN 24-26 MG PO TABS: 1.0000 | ORAL_TABLET | Freq: Two times a day (BID) | ORAL | Status: DC

## 2015-03-04 NOTE — Patient Instructions (Addendum)
1.  Stop Lisinopril - no further doses.  2.  Start Entresto in the am 03/05/15 - take twice daily. 3.  Return to VAD clinic Friday.  4.  Will call you with INR results and coumadin dosing.

## 2015-03-04 NOTE — Progress Notes (Signed)
Symptom  Yes  No  Details   Angina       x Activity:   Claudication       x How far:   Syncope       x When:  Stroke       x   Orthopnea       x How many pillows:  1 pillow  PND       x How often:  CPAP       N/A How many hrs:   Pedal edema       x   Abd fullness       x   N&V       x good appetite; intentional wt loss over last month  Diaphoresis       x When:  Bleeding      x   Urine color    light yellow  SOB       x Activity:   Palpitations        x      When: daily; increasing   ICD shock       x   Hospitlizaitons       x When/where/why:  ED visit       x When/where/why:  Other MD       x When/who/why:    Activity            decreased due to palpitations  Fluid    No limitations  Diet    No limitations   Vital signs: HR:  103 MAP BP: 140 Auto cuff:  151/78 (121)        132/91 (107) O2 Sat: 98% Wt:  245.6 lbs Last wt:   249  lbs Ht:  6'2"  LVAD interrogation reveals:  Speed:  9400 Flow:  5.2 Power: 5.7  PI: 7.7 Alarms:  none Events:  none Fixed speed:  9400 Low speed limit:  8800  11V battery status: Primary controller: replace 29 months             Secondary controller: replace 12 months   LVAD exit site:  Well healed and incorporated. The velour is fully implanted at exit site. Dressing dry and intact. No erythema or drainage. Stabilization device present and accurately applied. Driveline dressing is being changed q 3 - 5 days per sterile technique using Sorbaview dressing with biopatch on exit site. Pt does self dressing changes. Pt denies fever or chills.  Pt denies any alarms or VAD equipment issues. Pt is completing weekly and monthly maintenance for LVAD equipment. LVAD equipment check completed and is in good working order. Back-up equipment present. LVAD education done on emergency procedures and precautions and reviewed exit site care.  Encounter Details: Feeling much better since dropped his speed to 9400 / 8800. Experiencing few heart palpitations.  Weight is down about 4 lbs and fluid looks stable.  MAP elevated 121; re-checked at 107--currently taking hydralazine 100 mg BID, Cardura 8 mg at night, Norvasc 10 mg daily, and Lisinopril 40 mg am and 20 mg pm. Pt's last dose of Lisinopril was last night; per Dr. Gala Romney, pt will stop Lisinopril and start Entresto 24-26 tomorrow morning. He will take twice daily and come back to VAD clinic Friday for BP and med check.    Patient Instructions: 1.  Stop Lisinopril - no further doses.  2.  Start Entresto in the am 03/05/15 - take twice daily. 3.  Return to VAD clinic Friday.  4.  Will call you with INR results and coumadin dosing.   Hessie Diener, RN VAD Coordinator  Office: [phone] 24/7 VAD Pager: [phone]

## 2015-03-04 NOTE — Progress Notes (Signed)
LVAD CLINIC NOTE  Patient ID: Robert [name], male   DOB: 01-22-1956, 59 y.o.   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 status post dual chamber Medtronic ICD implanted April 2011. Cath 2011 showed normal coronaries. He also has a history of VTach . Currently listed as 1b list for heart tx at [facility].   Had [place] on 11/04/14 as part of dual listing process at [facility]. Normal filling pressures and cardiac output. Amio recently stopped due to thyrotoxicity. Placed on prednisone and tapazole. Had VT off amio and mexilitene started.  He is here for BP check. Overall continues to feel well with VAD speed down at 9600. Has been seeing Robert [name] for amio-induced hyperthyroidism. No further lightheadedness No CP, SOB, edema. No bleeding. Dual listed at [facility] and [facility]. Says he is taking all BP meds. No HA or neuro sx. MAP initially 121 on recheck 107.  Reports taking Coumadin as prescribed and adherence to anticoagulation based dietary restrictions.  Denies bright red blood per rectum or melena, no dark urine or hematuria.    LVAD interrogation reveals:  Speed: 9400 Flow: 5.2 Power: 5.7  PI: 7.7 Alarms: none Events: none Fixed speed: 9400 Low speed limit: 8800  11V battery status: Primary controller: replace 32 months   Secondary controller: replace 15 months   I reviewed the LVAD parameters from today, and compared the results to the patient's prior recorded data.  No programming changes were made.  The LVAD is functioning within specified parameters.  The patient performs LVAD self-test daily.  LVAD interrogation was negative for any significant power changes, alarms or PI events/speed drops.  LVAD equipment check completed and is in good working order.  Back-up equipment present.   LVAD education  done on emergency procedures and precautions and reviewed exit site care.    Past Medical History  Diagnosis Date  . AICD (automatic cardioverter/defibrillator) present 2011  . Hypertension 2000  . Congestive heart failure ([place]) 2011  . Pneumonia   . Bronchitis   . Pulmonary hypertension ([place])     Current Outpatient Prescriptions  Medication Sig Dispense Refill  . amLODipine (NORVASC) 10 MG tablet Take 1 tablet (10 mg total) by mouth daily. 90 tablet 3  . aspirin EC 325 MG tablet Take 1 tablet (325 mg total) by mouth daily. 30 tablet 6  . atorvastatin (LIPITOR) 20 MG tablet Take 1 tablet (20 mg total) by mouth daily. 90 tablet 3  . doxazosin (CARDURA) 8 MG tablet Take 1 tablet (8 mg total) by mouth daily. 30 tablet 3  . hydrALAZINE (APRESOLINE) 100 MG tablet Take 1 tablet (100 mg total) by mouth 2 (two) times daily. 30 tablet 6  . methimazole (TAPAZOLE) 10 MG tablet Take 2 tablets (20 mg total) by mouth 2 (two) times daily. 60 tablet 1  . mexiletine (MEXITIL) 150 MG capsule Take 1 capsule (150 mg total) by mouth 2 (two) times daily. 60 capsule 11  . pantoprazole (PROTONIX) 40 MG tablet Take 1 tablet (40 mg total) by mouth daily. 30 tablet 6  . warfarin (COUMADIN) 5 MG tablet Take 5 mg by mouth daily. Take as directed for INR goal 2.0 - 3.0    . allopurinol (ZYLOPRIM) 300 MG tablet Take 300 mg by mouth 2 (two) times a week.    Marland Kitchen  predniSONE (DELTASONE) 10 MG tablet TAKE ONE TABLET BY MOUTH DAILY WITH BREAKFAST (Patient not taking: Reported on 01/28/2015) 30 tablet 0  . sacubitril-valsartan (ENTRESTO) 24-26 MG Take 1 tablet by mouth 2 (two) times daily. 60 tablet 6   No current facility-administered medications for this encounter.    Imdur and Metoprolol  REVIEW OF SYSTEMS: All systems negative except as listed in HPI, PMH and Problem list.  Vital signs:  HR: 103 MAP BP: 140 Auto cuff: 151/78 (121)  132/91 (107) O2 Sat: 98% Wt: 245.6 lbs Last wt: 249  lbs Ht: 6'2"   Physical Exam: GENERAL: Well appearing, male who presents to clinic today in no acute distress. HEENT: normal  NECK: Supple, JVP flat;  2+ bilaterally, no bruits. RIJ site stable after cath.  No lymphadenopathy or thyromegaly appreciated.   CARDIAC:  Mechanical heart sounds with LVAD hum present.  LUNGS:  Clear to auscultation bilaterally.  ABDOMEN:  Soft, round, nontender, positive bowel sounds x4.     LVAD exit site: well-healed and incorporated.  Dressing dry and intact.  No erythema or drainage.  Stabilization device present and accurately applied.  Driveline dressing is being changed daily per sterile technique.Driveline itself with multiple tears and twists EXTREMITIES:  Warm and dry, no cyanosis, clubbing, rash or edema  NEUROLOGIC:  Alert and oriented x 4.  Gait steady.  No aphasia.  No dysarthria.  Affect pleasant.     Recent Results (from the past 2160 hour(s))  INR/PT     Status: Abnormal   Collection Time: 12/06/14 10:30 AM  Result Value Ref Range   Prothrombin Time 17.5 (H) 11.6 - 15.2 seconds   INR 1.42 0.00 - 1.49  INR/PT     Status: Abnormal   Collection Time: 12/10/14  9:15 AM  Result Value Ref Range   Prothrombin Time 24.2 (H) 11.6 - 15.2 seconds   INR 2.19 (H) 0.00 - 4.70  Basic metabolic panel     Status: Abnormal   Collection Time: 12/10/14  9:15 AM  Result Value Ref Range   Sodium 138 135 - 145 mmol/L   Potassium 3.5 3.5 - 5.1 mmol/L   Chloride 107 101 - 111 mmol/L   CO2 23 22 - 32 mmol/L   Glucose, Bld 90 65 - 99 mg/dL   BUN 12 6 - 20 mg/dL   Creatinine, Ser 1.15 0.61 - 1.24 mg/dL   Calcium 8.8 (L) 8.9 - 10.3 mg/dL   GFR calc non Af Amer >60 >60 mL/min   GFR calc Af Amer >60 >60 mL/min    Comment: (NOTE) The eGFR has been calculated using the CKD EPI equation. This calculation has not been validated in all clinical situations. eGFR's persistently <60 mL/min signify possible Chronic Kidney Disease.    Anion gap 8 5 - 15  Magnesium      Status: None   Collection Time: 12/10/14  9:15 AM  Result Value Ref Range   Magnesium 2.0 1.7 - 2.4 mg/dL  Lactate dehydrogenase     Status: Abnormal   Collection Time: 12/10/14  9:15 AM  Result Value Ref Range   LDH 209 (H) 98 - 192 U/L  INR/PT     Status: Abnormal   Collection Time: 12/17/14  9:15 AM  Result Value Ref Range   Prothrombin Time 32.5 (H) 11.6 - 15.2 seconds   INR 3.25 (H) 0.00 - 1.49  INR/PT     Status: Abnormal   Collection Time: 12/24/14  9:30 AM  Result Value  Ref Range   Prothrombin Time 22.8 (H) 11.6 - 15.2 seconds   INR 2.03 (H) 0.00 - 1.49  INR/PT     Status: Abnormal   Collection Time: 01/07/15 11:39 AM  Result Value Ref Range   Prothrombin Time 22.4 (H) 11.6 - 15.2 seconds   INR 1.94 (H) <1.50    Comment: The INR is of principal utility in following patients on stable doses of oral anticoagulants.  The therapeutic range is generally 2.0 to 3.0, but may be 3.0 to 4.0 in patients with mechanical cardiac valves, recurrent embolisms and antiphospholipid antibodies (including lupus inhibitors).   Basic metabolic panel     Status: None   Collection Time: 01/28/15 11:05 AM  Result Value Ref Range   Sodium 141 135 - 145 mmol/L   Potassium 3.6 3.5 - 5.1 mmol/L   Chloride 107 101 - 111 mmol/L   CO2 26 22 - 32 mmol/L   Glucose, Bld 85 65 - 99 mg/dL   BUN 10 6 - 20 mg/dL   Creatinine, Ser 1.08 0.61 - 1.24 mg/dL   Calcium 9.2 8.9 - 10.3 mg/dL   GFR calc non Af Amer >60 >60 mL/min   GFR calc Af Amer >60 >60 mL/min    Comment: (NOTE) The eGFR has been calculated using the CKD EPI equation. This calculation has not been validated in all clinical situations. eGFR's persistently <60 mL/min signify possible Chronic Kidney Disease.    Anion gap 8 5 - 15  T4, free     Status: Abnormal   Collection Time: 01/28/15 11:05 AM  Result Value Ref Range   Free T4 2.68 (H) 0.61 - 1.12 ng/dL  CBC     Status: None   Collection Time: 01/28/15 11:05 AM  Result Value Ref  Range   WBC 6.7 4.0 - 10.5 K/uL   RBC 4.86 4.22 - 5.81 MIL/uL   Hemoglobin 13.3 13.0 - 17.0 g/dL   HCT 41.2 39.0 - 52.0 %   MCV 84.8 78.0 - 100.0 fL   MCH 27.4 26.0 - 34.0 pg   MCHC 32.3 30.0 - 36.0 g/dL   RDW 14.8 11.5 - 15.5 %   Platelets 194 150 - 400 K/uL  Protime-INR     Status: Abnormal   Collection Time: 01/28/15 11:05 AM  Result Value Ref Range   Prothrombin Time 21.2 (H) 11.6 - 15.2 seconds   INR 1.84 (H) 0.00 - 1.49  Lactate dehydrogenase     Status: None   Collection Time: 01/28/15 11:05 AM  Result Value Ref Range   LDH 190 98 - 192 U/L  TSH     Status: Abnormal   Collection Time: 01/28/15 11:06 AM  Result Value Ref Range   TSH 0.019 (L) 0.350 - 4.500 uIU/mL  T3, free     Status: None   Collection Time: 01/28/15 11:06 AM  Result Value Ref Range   T3, Free 4.2 2.0 - 4.4 pg/mL    Comment: (NOTE) Performed At: [facility] [place], [place] [id number] Lindon Romp MD YF:[id number]   Protime-INR     Status: Abnormal   Collection Time: 02/05/15  9:30 AM  Result Value Ref Range   Prothrombin Time 23.7 (H) 11.6 - 15.2 seconds   INR 2.14 (H) 0.00 - 1.49  Protime-INR     Status: Abnormal   Collection Time: 02/18/15  9:30 AM  Result Value Ref Range   Prothrombin Time 24.1 (H) 11.6 - 15.2 seconds   INR  2.18 (H) 0.00 - 1.49  INR/PT     Status: Abnormal   Collection Time: 03/04/15 12:04 PM  Result Value Ref Range   Prothrombin Time 23.2 (H) 11.6 - 15.2 seconds   INR 2.08 (H) 0.00 - 0.22  Basic metabolic panel     Status: Abnormal   Collection Time: 03/04/15 12:05 PM  Result Value Ref Range   Sodium 140 135 - 145 mmol/L   Potassium 4.2 3.5 - 5.1 mmol/L   Chloride 107 101 - 111 mmol/L   CO2 27 22 - 32 mmol/L   Glucose, Bld 90 65 - 99 mg/dL   BUN 9 6 - 20 mg/dL   Creatinine, Ser 1.11 0.61 - 1.24 mg/dL   Calcium 8.8 (L) 8.9 - 10.3 mg/dL   GFR calc non Af Amer >60 >60 mL/min   GFR calc Af Amer >60 >60 mL/min    Comment: (NOTE) The eGFR  has been calculated using the CKD EPI equation. This calculation has not been validated in all clinical situations. eGFR's persistently <60 mL/min signify possible Chronic Kidney Disease.    Anion gap 6 5 - 15    ASSESSMENT AND PLAN:   1)  HTN-  - MAP remains very high despite multiple anti-HTN meds. I strongly suspect non-compliance despite his reporting. - Will stop lisinopril for 36 hours and then switch to Entresto 24/26 bid with rapid titration. If BP remains high I will admit for observation and BP check on current oral regimen to further investigate for noncompliance. 2) Chronic systolic HF, s/p HMII LVAD implant 05/2012. Currently 1B at [facility].  - He is improved with VAD speed down to 9600.  - Now dual listed at [facility]. RHC looks great.  - He is not on a BB. He was able to tolerate low-doseToprol before LVAD, however has been intolerant since that time  - Reinforced the need and importance of daily weights, a low sodium diet, and fluid restriction (less than 2 L a day). Instructed to call the HF clinic if weight increases more than 3 lbs overnight or 5 lbs in a week.  3) Palpitations - Improved. VT therapies off. Has lowered detection zone to help track 4) Hyperthyroidism - Follows with Robert [name]. 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 Amio stopped. Now on tapazole and prednisone.  5) NSVT/VT  - Off amio. On mexilitene. 6) PVCs - Imptoving with treatment of hyperthyroidism.  7) Anticoagulation with coumadin  - Continue coumadin and ASA 325 mg for LVAD. 8) LVAD  - Improved on 9600. Continue transplant process. Driveline site looks good.   Glori Bickers MD  11:47 PM

## 2015-03-07 ENCOUNTER — Ambulatory Visit (HOSPITAL_COMMUNITY)
Admission: RE | Admit: 2015-03-07 | Discharge: 2015-03-07 | Disposition: A | Discharge status: Home / Self Care | Payer: Medicaid Other | Source: Ambulatory Visit | Attending: Cardiology | Admitting: Cardiology

## 2015-03-07 ENCOUNTER — Telehealth (HOSPITAL_COMMUNITY): Payer: Self-pay | Admitting: Infectious Diseases

## 2015-03-07 ENCOUNTER — Encounter (HOSPITAL_COMMUNITY): Payer: Self-pay

## 2015-03-07 VITALS — BP 128/92 | HR 86

## 2015-03-07 DIAGNOSIS — Z95811 Presence of heart assist device: Secondary | ICD-10-CM | POA: Insufficient documentation

## 2015-03-07 DIAGNOSIS — I10 Essential (primary) hypertension: Secondary | ICD-10-CM | POA: Insufficient documentation

## 2015-03-07 NOTE — Progress Notes (Signed)
Blood Pressure Check:  Doppler Pressure: 126  Automatic Cuff: 128/92 (106)  Started on 24-26 dose Entresto on 03/04/15 for uncontrolled HTN. Questioning his compliance with medications. We know he does not take his Hydralazine TID as instructed. We discussed his medication regimen and he disclosed to me the following:   AM Meds ~ 0700 - 0800: takes Entresto dose.    Afternoon ~ 1200 - 1400: takes first dose of Hydralazine 100 mg.    PM Meds ~ 1700 - 1800 takes second Entresto dose.   Bedtime (varies) takes Cardura, second Hydralazine and Amlodipine dose.   He reports he tried to take the Hydralazine 100 and Amlodipine 10 in the AM with [facility] however he "felt very dizzy and heart was pounding" when he did that so much that he had to skip his son's basketball game. Advised to take Amlodipine in the AM with his Entresto for a few days to give his body time to adjust then consider adding the Hydralazine in the AM along with that in the future. Really needs to take the Hydralazine TID for maximum effectiveness, and reinforced this today.   Interrogated LVAD and PI events are rare but PI noted to be recorded at lowest 3.9 where he is usually > 7 since dropping his speed to 9400. He says he looks at the PI "very often throughout the day" to help gauge how much he needs to drink so he feels OK. Counseled that one of the actions of Sherryll Burger is to make him urinate, which he reports to have been doing a lot more of since starting the medication.   Reported that he "though the saw the power go to 9w the other day". Denies power to ever be > 10w. Reviewed history and saw where he ranged about 5.7 - 8.1 (although mostly was between 5.5 - 7.4w which is within normal operational expectations). Advised him to document his powers several times throughout the day to ensure they WNL. No speed drops to support the increased power need so may be R/T his HTN as opposed to early thrombus formation. INRs have been  variable in the past but on lower end of normal; he is on 325 mg ASA currently. Will continue to monitor and send event log with visit after next week since he reports this to having happened today upon waking to give more time for data collection as to if this will be a sustained occurrence or more frequent occurrence.    D/W Dr. Gala Romney the above findings. With him being sensitive to hypovolemia and BP improved since last visit will have him RTC in 1 week for BP check. If MAP still > 100 will consider admission with arterial BP monitoring to achieve adequate supervised medication titration.   Discussed the well documented increased risk for pump thrombosis and stroke with on-going hypertension and the importance of his medication regimen. He verbalized understanding today.   Rexene Alberts, RN VAD Coordinator   Office: [phone] 24/7 VAD Pager: [phone]

## 2015-03-07 NOTE — Telephone Encounter (Signed)
LVM to call back to schedule an appointment next week for BP monitoring.

## 2015-03-12 ENCOUNTER — Other Ambulatory Visit (HOSPITAL_COMMUNITY): Payer: Self-pay | Admitting: *Deleted

## 2015-03-12 ENCOUNTER — Telehealth (HOSPITAL_COMMUNITY): Payer: Self-pay | Admitting: *Deleted

## 2015-03-12 DIAGNOSIS — Z95811 Presence of heart assist device: Secondary | ICD-10-CM

## 2015-03-12 DIAGNOSIS — Z7901 Long term (current) use of anticoagulants: Secondary | ICD-10-CM

## 2015-03-12 NOTE — Telephone Encounter (Signed)
Called pt and left message informing him of scheduled BP check along with INR here next week on Wednesday, 03/19/15 at 10:00 am.  Asked pt to call if any questions or if he needed to reschedule.

## 2015-03-19 ENCOUNTER — Other Ambulatory Visit: Payer: Medicaid Other

## 2015-03-19 ENCOUNTER — Encounter: Payer: Self-pay | Admitting: Licensed Clinical Social Worker

## 2015-03-19 ENCOUNTER — Ambulatory Visit (HOSPITAL_COMMUNITY)
Admission: RE | Admit: 2015-03-19 | Discharge: 2015-03-19 | Disposition: A | Discharge status: Home / Self Care | Payer: Medicaid Other | Source: Ambulatory Visit | Attending: Cardiology | Admitting: Cardiology

## 2015-03-19 ENCOUNTER — Ambulatory Visit: Payer: Self-pay | Admitting: Infectious Diseases

## 2015-03-19 ENCOUNTER — Telehealth (HOSPITAL_COMMUNITY): Payer: Self-pay | Admitting: Infectious Diseases

## 2015-03-19 DIAGNOSIS — I5022 Chronic systolic (congestive) heart failure: Secondary | ICD-10-CM | POA: Diagnosis not present

## 2015-03-19 DIAGNOSIS — Z7901 Long term (current) use of anticoagulants: Secondary | ICD-10-CM

## 2015-03-19 DIAGNOSIS — Z95811 Presence of heart assist device: Secondary | ICD-10-CM | POA: Diagnosis present

## 2015-03-19 DIAGNOSIS — R791 Abnormal coagulation profile: Secondary | ICD-10-CM

## 2015-03-19 DIAGNOSIS — I10 Essential (primary) hypertension: Secondary | ICD-10-CM

## 2015-03-19 LAB — PROTIME-INR
INR: 1.53 — AB (ref 0.00–1.49)
PROTHROMBIN TIME: 18.5 s — AB (ref 11.6–15.2)

## 2015-03-19 MED ORDER — SACUBITRIL-VALSARTAN 49-51 MG PO TABS: 1.0000 | ORAL_TABLET | Freq: Two times a day (BID) | ORAL | Status: DC

## 2015-03-19 NOTE — Telephone Encounter (Signed)
Called regarding BP check today.   Doppler 102 with Automated BP 112/82 (93). Improved on 24-26 dose of Entresto. D/W Dr. Gala Romney and will increase to 49-51 dose. Elizabeth Palau our HF Pharmacist will call to discuss medication assistance and PA process with him today. Verbalized he would answer her call to obtain medication instructions.   Placed on lovenox injections today. Difficult for him next week to come to clinic to check--advised to come Friday 03/21/15 for INR/LDH check to determine if we can achieve at least 1.8 level to stop injections before weekend. Instructed to start a dose today and then tomorrow begin BID into abdomen. He states he has 8 syringes of 120 mg at home. Instructed to waste 0.07 mL in syringe to obtain 110 mg dose. See anticoag note for further details. He comes back to VAD clinic within 2 weeks for FU on BP and kidney function tests.   Robert Alberts, RN VAD Coordinator   Office: [phone] 24/7 VAD Pager: [phone]

## 2015-03-19 NOTE — Progress Notes (Signed)
CSW met with patient in the clinic to assist with paperwork needed for assistance with deferred mortgage payments. CSW reviewed with patient who completed much of the paperwork. CSW provided support and will continue to be available as needed. Raquel Sarna, [place]

## 2015-03-21 ENCOUNTER — Ambulatory Visit: Payer: Self-pay | Admitting: *Deleted

## 2015-03-21 ENCOUNTER — Ambulatory Visit (HOSPITAL_COMMUNITY)
Admission: RE | Admit: 2015-03-21 | Discharge: 2015-03-21 | Disposition: A | Discharge status: Home / Self Care | Payer: Medicaid Other | Source: Ambulatory Visit | Attending: Internal Medicine | Admitting: Internal Medicine

## 2015-03-21 DIAGNOSIS — Z7901 Long term (current) use of anticoagulants: Secondary | ICD-10-CM

## 2015-03-21 DIAGNOSIS — R791 Abnormal coagulation profile: Secondary | ICD-10-CM

## 2015-03-21 DIAGNOSIS — Z95811 Presence of heart assist device: Secondary | ICD-10-CM

## 2015-03-21 LAB — PROTIME-INR
INR: 1.96 — ABNORMAL HIGH (ref 0.00–1.49)
Prothrombin Time: 22.2 seconds — ABNORMAL HIGH (ref 11.6–15.2)

## 2015-03-21 LAB — LACTATE DEHYDROGENASE: LDH: 216 U/L — ABNORMAL HIGH (ref 98–192)

## 2015-03-24 LAB — PROTIME-INR: INR: 2.5 — AB (ref <=1.1)

## 2015-03-25 ENCOUNTER — Ambulatory Visit (HOSPITAL_COMMUNITY): Payer: Self-pay | Admitting: Infectious Diseases

## 2015-03-25 ENCOUNTER — Other Ambulatory Visit (HOSPITAL_COMMUNITY): Payer: Medicaid Other

## 2015-03-26 ENCOUNTER — Telehealth (HOSPITAL_COMMUNITY): Payer: Self-pay | Admitting: Infectious Diseases

## 2015-03-26 NOTE — Telephone Encounter (Signed)
Called to switch clinic appointment to accommodate the hospital DC visit week follow ups with limited provider schedule next week. He was agreeable to whatever date.   Also mentioned that his device is beeping everyday at 8:00. Provided him with the Device Clinic's phone number [phone] to update them and see if it can be checked. Last remote visit was 08/05/14. He was supposed to remote another check on August 2016 and return for OV in 01/2015. ERI=2.63V at the time of remote upload.   Rexene Alberts, RN VAD Coordinator   Office: [phone] 24/7 VAD Pager: [phone]

## 2015-03-26 NOTE — Telephone Encounter (Signed)
Called re: entresto PA. Still in pending status as of now per [place]. Advised that he will need to come to the clinic to pick up samples to supply for him until it has been approved.   Verbalized he would come tomorrow.

## 2015-03-27 ENCOUNTER — Encounter: Payer: Self-pay | Admitting: *Deleted

## 2015-03-27 NOTE — Telephone Encounter (Signed)
Spoke w/ pt and asked that he send a manual transmission w/ his home monitor. Pt stated that he would when he gets home later today.

## 2015-03-28 NOTE — Telephone Encounter (Signed)
Spoke w/ pt and informed him that his device reached RRT on 02-21-15. Informed pt that once his device reaches RRT he has about 3 months left on his battery. Informed pt that a scheduler will be calling him to schedule an appt w/ MD. Pt verbalized understanding.

## 2015-04-01 ENCOUNTER — Encounter (HOSPITAL_COMMUNITY): Payer: Medicaid Other

## 2015-04-04 ENCOUNTER — Encounter: Payer: Medicaid Other | Admitting: Physician Assistant

## 2015-04-08 ENCOUNTER — Encounter (HOSPITAL_COMMUNITY): Payer: Medicaid Other

## 2015-04-08 ENCOUNTER — Telehealth (HOSPITAL_COMMUNITY): Payer: Self-pay | Admitting: Pharmacist

## 2015-04-08 ENCOUNTER — Other Ambulatory Visit (HOSPITAL_COMMUNITY): Payer: Self-pay | Admitting: Infectious Diseases

## 2015-04-08 ENCOUNTER — Telehealth (HOSPITAL_COMMUNITY): Payer: Self-pay | Admitting: Infectious Diseases

## 2015-04-08 DIAGNOSIS — I5022 Chronic systolic (congestive) heart failure: Secondary | ICD-10-CM

## 2015-04-08 DIAGNOSIS — Z95811 Presence of heart assist device: Secondary | ICD-10-CM

## 2015-04-08 DIAGNOSIS — Z7901 Long term (current) use of anticoagulants: Secondary | ICD-10-CM

## 2015-04-08 NOTE — Telephone Encounter (Signed)
Called and LVM re: missed VAD appointment today. Asked to reschedule this week and call back to make an appointment.

## 2015-04-08 NOTE — Telephone Encounter (Signed)
Entresto 49-51 mg PA approved by Medicaid through 03/19/2016. Verified with pharmacy.   Tyler Deis. Bonnye Fava, PharmD, BCPS, CPP Clinical Pharmacist Pager: [phone] Phone: [phone] 04/08/2015 10:07 AM

## 2015-04-09 ENCOUNTER — Ambulatory Visit (HOSPITAL_COMMUNITY): Payer: Self-pay | Admitting: Infectious Diseases

## 2015-04-09 ENCOUNTER — Encounter: Payer: Self-pay | Admitting: Cardiovascular Disease

## 2015-04-09 ENCOUNTER — Ambulatory Visit (HOSPITAL_COMMUNITY)
Admission: RE | Admit: 2015-04-09 | Discharge: 2015-04-09 | Disposition: A | Discharge status: Home / Self Care | Payer: Medicaid Other | Source: Ambulatory Visit | Attending: Internal Medicine | Admitting: Internal Medicine

## 2015-04-09 ENCOUNTER — Other Ambulatory Visit (HOSPITAL_COMMUNITY): Payer: Self-pay | Admitting: Anesthesiology

## 2015-04-09 ENCOUNTER — Encounter (HOSPITAL_COMMUNITY): Payer: Self-pay

## 2015-04-09 ENCOUNTER — Ambulatory Visit (INDEPENDENT_AMBULATORY_CARE_PROVIDER_SITE_OTHER): Payer: Medicaid Other | Admitting: Cardiovascular Disease

## 2015-04-09 VITALS — BP 72/70 | HR 72 | Resp 16 | Ht 74.0 in | Wt 250.2 lb

## 2015-04-09 VITALS — BP 125/81 | HR 72 | Ht 74.0 in

## 2015-04-09 DIAGNOSIS — Z7982 Long term (current) use of aspirin: Secondary | ICD-10-CM | POA: Insufficient documentation

## 2015-04-09 DIAGNOSIS — Z95810 Presence of automatic (implantable) cardiac defibrillator: Secondary | ICD-10-CM | POA: Diagnosis not present

## 2015-04-09 DIAGNOSIS — I429 Cardiomyopathy, unspecified: Secondary | ICD-10-CM | POA: Diagnosis not present

## 2015-04-09 DIAGNOSIS — E0590 Thyrotoxicosis, unspecified without thyrotoxic crisis or storm: Secondary | ICD-10-CM | POA: Insufficient documentation

## 2015-04-09 DIAGNOSIS — I472 Ventricular tachycardia, unspecified: Secondary | ICD-10-CM

## 2015-04-09 DIAGNOSIS — Z79899 Other long term (current) drug therapy: Secondary | ICD-10-CM | POA: Diagnosis not present

## 2015-04-09 DIAGNOSIS — I5023 Acute on chronic systolic (congestive) heart failure: Secondary | ICD-10-CM | POA: Insufficient documentation

## 2015-04-09 DIAGNOSIS — I493 Ventricular premature depolarization: Secondary | ICD-10-CM | POA: Insufficient documentation

## 2015-04-09 DIAGNOSIS — I10 Essential (primary) hypertension: Secondary | ICD-10-CM

## 2015-04-09 DIAGNOSIS — I5022 Chronic systolic (congestive) heart failure: Secondary | ICD-10-CM

## 2015-04-09 DIAGNOSIS — M5386 Other specified dorsopathies, lumbar region: Secondary | ICD-10-CM

## 2015-04-09 DIAGNOSIS — R002 Palpitations: Secondary | ICD-10-CM | POA: Insufficient documentation

## 2015-04-09 DIAGNOSIS — T829XXA Unspecified complication of cardiac and vascular prosthetic device, implant and graft, initial encounter: Secondary | ICD-10-CM | POA: Diagnosis not present

## 2015-04-09 DIAGNOSIS — Z95811 Presence of heart assist device: Secondary | ICD-10-CM | POA: Diagnosis not present

## 2015-04-09 DIAGNOSIS — I5042 Chronic combined systolic (congestive) and diastolic (congestive) heart failure: Secondary | ICD-10-CM

## 2015-04-09 DIAGNOSIS — Z7901 Long term (current) use of anticoagulants: Secondary | ICD-10-CM | POA: Insufficient documentation

## 2015-04-09 DIAGNOSIS — Z4502 Encounter for adjustment and management of automatic implantable cardiac defibrillator: Secondary | ICD-10-CM

## 2015-04-09 DIAGNOSIS — I428 Other cardiomyopathies: Secondary | ICD-10-CM

## 2015-04-09 DIAGNOSIS — M539 Dorsopathy, unspecified: Secondary | ICD-10-CM

## 2015-04-09 LAB — BASIC METABOLIC PANEL
Anion gap: 7 (ref 5–15)
BUN: 7 mg/dL (ref 6–20)
CALCIUM: 9.2 mg/dL (ref 8.9–10.3)
CO2: 27 mmol/L (ref 22–32)
CREATININE: 1.16 mg/dL (ref 0.61–1.24)
Chloride: 105 mmol/L (ref 101–111)
GFR calc Af Amer: >60 mL/min (ref >60)
GFR calc non Af Amer: >60 mL/min (ref >60)
GLUCOSE: 95 mg/dL (ref 65–99)
Potassium: 3.8 mmol/L (ref 3.5–5.1)
Sodium: 139 mmol/L (ref 135–145)

## 2015-04-09 LAB — CBC
HCT: 40.9 % (ref 39.0–52.0)
HEMOGLOBIN: 13.1 g/dL (ref 13.0–17.0)
MCH: 27.9 pg (ref 26.0–34.0)
MCHC: 32 g/dL (ref 30.0–36.0)
MCV: 87 fL (ref 78.0–100.0)
PLATELETS: 172 10*3/uL (ref 150–400)
RBC: 4.7 MIL/uL (ref 4.22–5.81)
RDW: 14.8 % (ref 11.5–15.5)
WBC: 6.4 10*3/uL (ref 4.0–10.5)

## 2015-04-09 LAB — PROTIME-INR
INR: 2.43 — ABNORMAL HIGH (ref 0.00–1.49)
PROTHROMBIN TIME: 26.2 s — AB (ref 11.6–15.2)

## 2015-04-09 LAB — LACTATE DEHYDROGENASE: LDH: 225 U/L — ABNORMAL HIGH (ref 98–192)

## 2015-04-09 MED ORDER — SACUBITRIL-VALSARTAN 97-103 MG PO TABS: 1.0000 | ORAL_TABLET | Freq: Two times a day (BID) | ORAL | Status: DC

## 2015-04-09 MED ORDER — TRAMADOL HCL 50 MG PO TABS: 50.0000 mg | ORAL_TABLET | Freq: Three times a day (TID) | ORAL | Status: DC | PRN

## 2015-04-09 NOTE — Patient Instructions (Addendum)
Dr Royann Shivers has recommended making the following medication changes: START Tramadol 50 mg - take 1 tablet by mouth every 6-8 hours as needed.  Your physician recommends that you schedule a follow-up appointment in 1 year. You will receive a reminder letter in the mail two months in advance. If you don't receive a letter, please call our office to schedule the follow-up appointment.  If you need a refill on your cardiac medications before your next appointment, please call your pharmacy.

## 2015-04-09 NOTE — Patient Instructions (Signed)
1. Increase your Entresto (blood pressure pill) 2 pills twice a day until we get your new dose.   2. Come back to see [place] in 2 weeks to check your blood pressure.   3. Come back in 2 months for VAD clinic visit. We will discuss frequency of visits with [facility] and will call you lab results.

## 2015-04-09 NOTE — Progress Notes (Signed)
Patient ID: Robert [name], male   DOB: 15-Dec-1955, 60 y.o.   MRN: [id number]  LVAD CLINIC NOTE  Patient ID: Robert [name], male   DOB: [birth year], 60 y.o.   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 status post dual chamber Medtronic ICD implanted April 2011. Cath 2011 showed normal coronaries. He also has a history of VTach . Currently listed as 1b list for heart tx at [facility].   Had [place] on 11/04/14 as part of dual listing process at [facility]. Normal filling pressures and cardiac output. Amio recently stopped due to thyrotoxicity. Placed on prednisone and tapazole. Had VT off amio and mexilitene started.  He is here for routine f/u.  Overall continues to feel well with VAD speed down at 9600. No SOB, edema. 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 Dual listed at [facility] and [facility]. At leas visit switched to [facility] due to severe HTN despite multi-drug regimen. BP now coming down nicely. ICD is nearing EOL.   Reports taking Coumadin as prescribed and adherence to anticoagulation based dietary restrictions.  Denies bright red blood per rectum or melena, no dark urine or hematuria.    LVAD interrogation reveals:  Speed: 9400 Flow: 7.6 Power: 6.6w PI: 6.5 Alarms: none Events: 2 - 4 daily outlier on 04/08/15 with 16 events--> wasn't feeling well.  Fixed speed: 9400 Low speed limit: 8800  I reviewed the LVAD parameters from today, and compared the results to the patient's prior recorded data.  No programming changes were made.  The LVAD is functioning within specified parameters.  The patient performs LVAD self-test daily.  LVAD interrogation was negative for any significant power changes, alarms or PI events/speed drops.  LVAD equipment check completed and is in good working order.  Back-up equipment present.   LVAD  education done on emergency procedures and precautions and reviewed exit site care.    Past Medical History  Diagnosis Date  . AICD (automatic cardioverter/defibrillator) present 2011  . Hypertension 2000  . Congestive heart failure ([place]) 2011  . Pneumonia   . Bronchitis   . Pulmonary hypertension ([place])     Current Outpatient Prescriptions  Medication Sig Dispense Refill  . allopurinol (ZYLOPRIM) 300 MG tablet Take 300 mg by mouth 2 (two) times a week.    Marland Kitchen amLODipine (NORVASC) 10 MG tablet Take 1 tablet (10 mg total) by mouth daily. 90 tablet 3  . aspirin EC 325 MG tablet Take 1 tablet (325 mg total) by mouth daily. 30 tablet 6  . atorvastatin (LIPITOR) 20 MG tablet Take 1 tablet (20 mg total) by mouth daily. 90 tablet 3  . doxazosin (CARDURA) 8 MG tablet Take 1 tablet (8 mg total) by mouth daily. 30 tablet 3  . hydrALAZINE (APRESOLINE) 100 MG tablet Take 1 tablet (100 mg total) by mouth 2 (two) times daily. 30 tablet 6  . methimazole (TAPAZOLE) 10 MG tablet Take 2 tablets (20 mg total) by mouth 2 (two) times daily. 60 tablet 1  . mexiletine (MEXITIL) 150 MG capsule Take 1 capsule (150 mg total) by mouth 2 (two) times daily. 60 capsule 11  . pantoprazole (PROTONIX) 40 MG tablet Take 1 tablet (40 mg total) by mouth daily. 30 tablet 6  . warfarin (COUMADIN) 5 MG tablet Take 5 mg by  mouth daily. Take as directed for INR goal 2.0 - 3.0    . sacubitril-valsartan (ENTRESTO) 97-103 MG Take 1 tablet by mouth 2 (two) times daily. 60 tablet 6  . traMADol (ULTRAM) 50 MG tablet Take 1 tablet (50 mg total) by mouth every 8 (eight) hours as needed for moderate pain. 30 tablet 0   No current facility-administered medications for this encounter.    Imdur and Metoprolol  REVIEW OF SYSTEMS: All systems negative except as listed in HPI, PMH and Problem list.  Vital signs:  Vital signs: HR: 84 MAP BP: 88 O2 Sat: 98% Wt: 248 lbs Last wt: 245.6 lbs Ht: 6'2"   Physical Exam: GENERAL: Well  appearing, male who presents to clinic today in no acute distress. HEENT: normal  NECK: Supple, JVP flat;  2+ bilaterally, no bruits.  No lymphadenopathy or thyromegaly appreciated.   CARDIAC:  Mechanical heart sounds with LVAD hum present.  LUNGS:  Clear to auscultation bilaterally.  ABDOMEN:  Soft, round, nontender, positive bowel sounds x4.     LVAD exit site: well-healed and incorporated.  Dressing dry and intact.  No erythema or drainage.  Stabilization device present and accurately applied.  Driveline dressing is being changed daily per sterile technique.Driveline itself with multiple tears and twists EXTREMITIES:  Warm and dry, no cyanosis, clubbing, rash or edema  NEUROLOGIC:  Alert and oriented x 4.  Gait steady.  No aphasia.  No dysarthria.  Affect pleasant.     Recent Results (from the past 2160 hour(s))  Basic metabolic panel     Status: None   Collection Time: 01/28/15 11:05 AM  Result Value Ref Range   Sodium 141 135 - 145 mmol/L   Potassium 3.6 3.5 - 5.1 mmol/L   Chloride 107 101 - 111 mmol/L   CO2 26 22 - 32 mmol/L   Glucose, Bld 85 65 - 99 mg/dL   BUN 10 6 - 20 mg/dL   Creatinine, Ser 1.08 0.61 - 1.24 mg/dL   Calcium 9.2 8.9 - 10.3 mg/dL   GFR calc non Af Amer >60 >60 mL/min   GFR calc Af Amer >60 >60 mL/min    Comment: (NOTE) The eGFR has been calculated using the CKD EPI equation. This calculation has not been validated in all clinical situations. eGFR's persistently <60 mL/min signify possible Chronic Kidney Disease.    Anion gap 8 5 - 15  T4, free     Status: Abnormal   Collection Time: 01/28/15 11:05 AM  Result Value Ref Range   Free T4 2.68 (H) 0.61 - 1.12 ng/dL  CBC     Status: None   Collection Time: 01/28/15 11:05 AM  Result Value Ref Range   WBC 6.7 4.0 - 10.5 K/uL   RBC 4.86 4.22 - 5.81 MIL/uL   Hemoglobin 13.3 13.0 - 17.0 g/dL   HCT 41.2 39.0 - 52.0 %   MCV 84.8 78.0 - 100.0 fL   MCH 27.4 26.0 - 34.0 pg   MCHC 32.3 30.0 - 36.0 g/dL   RDW 14.8  11.5 - 15.5 %   Platelets 194 150 - 400 K/uL  Protime-INR     Status: Abnormal   Collection Time: 01/28/15 11:05 AM  Result Value Ref Range   Prothrombin Time 21.2 (H) 11.6 - 15.2 seconds   INR 1.84 (H) 0.00 - 1.49  Lactate dehydrogenase     Status: None   Collection Time: 01/28/15 11:05 AM  Result Value Ref Range   LDH 190 98 -  192 U/L  TSH     Status: Abnormal   Collection Time: 01/28/15 11:06 AM  Result Value Ref Range   TSH 0.019 (L) 0.350 - 4.500 uIU/mL  T3, free     Status: None   Collection Time: 01/28/15 11:06 AM  Result Value Ref Range   T3, Free 4.2 2.0 - 4.4 pg/mL    Comment: (NOTE) Performed At: [facility] [place], [place] [id number] Lindon Romp MD VO:[id number]   Protime-INR     Status: Abnormal   Collection Time: 02/05/15  9:30 AM  Result Value Ref Range   Prothrombin Time 23.7 (H) 11.6 - 15.2 seconds   INR 2.14 (H) 0.00 - 1.49  Protime-INR     Status: Abnormal   Collection Time: 02/18/15  9:30 AM  Result Value Ref Range   Prothrombin Time 24.1 (H) 11.6 - 15.2 seconds   INR 2.18 (H) 0.00 - 1.49  INR/PT     Status: Abnormal   Collection Time: 03/04/15 12:04 PM  Result Value Ref Range   Prothrombin Time 23.2 (H) 11.6 - 15.2 seconds   INR 2.08 (H) 0.00 - 6.94  Basic metabolic panel     Status: Abnormal   Collection Time: 03/04/15 12:05 PM  Result Value Ref Range   Sodium 140 135 - 145 mmol/L   Potassium 4.2 3.5 - 5.1 mmol/L   Chloride 107 101 - 111 mmol/L   CO2 27 22 - 32 mmol/L   Glucose, Bld 90 65 - 99 mg/dL   BUN 9 6 - 20 mg/dL   Creatinine, Ser 1.11 0.61 - 1.24 mg/dL   Calcium 8.8 (L) 8.9 - 10.3 mg/dL   GFR calc non Af Amer >60 >60 mL/min   GFR calc Af Amer >60 >60 mL/min    Comment: (NOTE) The eGFR has been calculated using the CKD EPI equation. This calculation has not been validated in all clinical situations. eGFR's persistently <60 mL/min signify possible Chronic Kidney Disease.    Anion gap 6 5 - 15    Protime-INR     Status: Abnormal   Collection Time: 03/19/15 11:00 AM  Result Value Ref Range   Prothrombin Time 18.5 (H) 11.6 - 15.2 seconds   INR 1.53 (H) 0.00 - 1.49  Protime-INR     Status: Abnormal   Collection Time: 03/21/15 10:45 AM  Result Value Ref Range   Prothrombin Time 22.2 (H) 11.6 - 15.2 seconds   INR 1.96 (H) 0.00 - 1.49  Lactate dehydrogenase     Status: Abnormal   Collection Time: 03/21/15 10:45 AM  Result Value Ref Range   LDH 216 (H) 98 - 192 U/L  Protime-INR     Status: Abnormal   Collection Time: 03/24/15 12:00 AM  Result Value Ref Range   INR 2.5 (A) .9 - 1.1  Basic metabolic panel     Status: None   Collection Time: 04/09/15 12:21 PM  Result Value Ref Range   Sodium 139 135 - 145 mmol/L   Potassium 3.8 3.5 - 5.1 mmol/L   Chloride 105 101 - 111 mmol/L   CO2 27 22 - 32 mmol/L   Glucose, Bld 95 65 - 99 mg/dL   BUN 7 6 - 20 mg/dL   Creatinine, Ser 1.16 0.61 - 1.24 mg/dL   Calcium 9.2 8.9 - 10.3 mg/dL   GFR calc non Af Amer >60 >60 mL/min   GFR calc Af Amer >60 >60 mL/min    Comment: (NOTE) The eGFR  has been calculated using the CKD EPI equation. This calculation has not been validated in all clinical situations. eGFR's persistently <60 mL/min signify possible Chronic Kidney Disease.    Anion gap 7 5 - 15  CBC     Status: None   Collection Time: 04/09/15 12:21 PM  Result Value Ref Range   WBC 6.4 4.0 - 10.5 K/uL   RBC 4.70 4.22 - 5.81 MIL/uL   Hemoglobin 13.1 13.0 - 17.0 g/dL   HCT 40.9 39.0 - 52.0 %   MCV 87.0 78.0 - 100.0 fL   MCH 27.9 26.0 - 34.0 pg   MCHC 32.0 30.0 - 36.0 g/dL   RDW 14.8 11.5 - 15.5 %   Platelets 172 150 - 400 K/uL  Protime-INR     Status: Abnormal   Collection Time: 04/09/15 12:21 PM  Result Value Ref Range   Prothrombin Time 26.2 (H) 11.6 - 15.2 seconds   INR 2.43 (H) 0.00 - 1.49  Lactate dehydrogenase     Status: Abnormal   Collection Time: 04/09/15 12:21 PM  Result Value Ref Range   LDH 225 (H) 98 - 192 U/L     ASSESSMENT AND PLAN:   1)  HTN-  - MAP much improved with Entresto. Will increase to 97/03 bid. May be able to wean hydralazine a bit at next visit. We will see.  2) Chronic systolic HF, s/p HMII LVAD implant 05/2012.  - He is improved with VAD speed down to 9600.  - Now dual listed at [facility]. RHC looks great.  - He is not on a BB. He was able to tolerate low-doseToprol before LVAD, however has been intolerant since that time  - Reinforced the need and importance of daily weights, a low sodium diet, and fluid restriction (less than 2 L a day). Instructed to call the HF clinic if weight increases more than 3 lbs overnight or 5 lbs in a week.  3) Palpitations - Improved. VT therapies off. Has lowered detection zone to help track. ICD now at EOL. We have decided to not replace given that risk of infection felt to be higher than benefit or replacing as VT usually well tolerated in setting of VAD support 4) Hyperthyroidism - Amio induced. Follows with Dr. Forde Dandy. Amio stopped. Now on tapazole and prednisone.  5) NSVT/VT  - Off amio. On mexilitene. 6) PVCs - Much improved with treatment of hyperthyroidism.  7) Anticoagulation with coumadin  - Continue coumadin and ASA 325 mg for LVAD. 8) LVAD  - Improved on 9600. Continue transplant process. Driveline site looks good.   Glori Bickers MD  10:11 PM

## 2015-04-09 NOTE — Progress Notes (Signed)
Patient ID: Robert [name], male   DOB: 05-06-1955, 60 y.o.   MRN: [id number]     Cardiology Office Note    Date:  04/11/2015   ID:  Robert [name], DOB [birth year], MRN [id number]  PCP:  Almon Hercules, MD  Cardiologist:   Thurmon Fair, MD   Chief Complaint  Patient presents with  . Follow-up    ICD at RRT. no chest pain, no shortness of breath, no edema, has pain in legs, no cramping in legs, no lightheadedness or dizziness    History of Present Illness:  Robert [name] is a 60 y.o. male with severe nonischemic cardiomyopathy, previous sustained VT, s/p AICD, stage D systolic CHF s/p LVAD, on transplant list.  His last ICD check showed that his ICD reached RRT Nov 18. He has not had sustained VT or required any tachy therapies since LVAD implantation about 3 years ago. CHF has been well compensated with mechanical support.  He has NYHA class 2 status, has not required diuretics recently, is on comprehensive medical therapy for HF and has not had either bleeding or embolic complications related to his VAD.  He has problems with left knee and hip pain that sounds like it may be lumbar spine referred pain. He has had L-spine problems in the past.   Past Medical History  Diagnosis Date  . AICD (automatic cardioverter/defibrillator) present 2011  . Hypertension 2000  . Congestive heart failure (HCC) 2011  . Pneumonia   . Bronchitis   . Pulmonary hypertension [facility])     Past Surgical History  Procedure Laterality Date  . Cardiac defibrillator placement  06/03/11  . Cardiac catheterization    . Colonoscopy  12/29/2011    Procedure: COLONOSCOPY;  Surgeon: Iva Boop, MD;  Location: [facility] ENDOSCOPY;  Service: Endoscopy;  Laterality: N/A;  . Insertion of implantable left ventricular assist device N/A 06/02/2012    Procedure: INSERTION OF IMPLANTABLE LEFT VENTRICULAR ASSIST DEVICE;  Surgeon: Kerin Perna, MD;  Location: [facility] OR;  Service: Open Heart Surgery;  Laterality: N/A;  Nitric  Oxide; TEE; Medtronic AICD  . Intraoperative transesophageal echocardiogram N/A 06/02/2012    Procedure: INTRAOPERATIVE TRANSESOPHAGEAL ECHOCARDIOGRAM;  Surgeon: Kerin Perna, MD;  Location: [facility] OR;  Service: Open Heart Surgery;  Laterality: N/A;  . Left and right heart catheterization with coronary angiogram N/A 08/17/2011    Procedure: LEFT AND RIGHT HEART CATHETERIZATION WITH CORONARY ANGIOGRAM;  Surgeon: Thurmon Fair, MD;  Location: [facility] CATH LAB;  Service: Cardiovascular;  Laterality: N/A;  . Right heart catheterization N/A 05/30/2012    Procedure: RIGHT HEART CATH;  Surgeon: Dolores Patty, MD;  Location: [facility] CATH LAB;  Service: Cardiovascular;  Laterality: N/A;  . Intra-aortic balloon pump insertion N/A 05/31/2012    Procedure: INTRA-AORTIC BALLOON PUMP INSERTION;  Surgeon: Dolores Patty, MD;  Location: [facility] CATH LAB;  Service: Cardiovascular;  Laterality: N/A;  . Right heart catheterization N/A 06/10/2014    Procedure: RIGHT HEART CATH;  Surgeon: Dolores Patty, MD;  Location: [facility] CATH LAB;  Service: Cardiovascular;  Laterality: N/A;  . Cardiac catheterization N/A 11/04/2014    Procedure: Right Heart Cath;  Surgeon: Dolores Patty, MD;  Location: [facility] INVASIVE CV LAB;  Service: Cardiovascular;  Laterality: N/A;    Current Outpatient Prescriptions  Medication Sig Dispense Refill  . allopurinol (ZYLOPRIM) 300 MG tablet Take 300 mg by mouth 2 (two) times a week.    Marland Kitchen amLODipine (NORVASC) 10 MG tablet Take 1 tablet (10 mg  total) by mouth daily. 90 tablet 3  . aspirin EC 325 MG tablet Take 1 tablet (325 mg total) by mouth daily. 30 tablet 6  . atorvastatin (LIPITOR) 20 MG tablet Take 1 tablet (20 mg total) by mouth daily. 90 tablet 3  . doxazosin (CARDURA) 8 MG tablet Take 1 tablet (8 mg total) by mouth daily. 30 tablet 3  . hydrALAZINE (APRESOLINE) 100 MG tablet Take 1 tablet (100 mg total) by mouth 2 (two) times daily. 30 tablet 6  . methimazole (TAPAZOLE) 10 MG tablet Take 2 tablets  (20 mg total) by mouth 2 (two) times daily. 60 tablet 1  . mexiletine (MEXITIL) 150 MG capsule Take 1 capsule (150 mg total) by mouth 2 (two) times daily. 60 capsule 11  . pantoprazole (PROTONIX) 40 MG tablet Take 1 tablet (40 mg total) by mouth daily. 30 tablet 6  . sacubitril-valsartan (ENTRESTO) 97-103 MG Take 1 tablet by mouth 2 (two) times daily. 60 tablet 6  . warfarin (COUMADIN) 5 MG tablet Take 5 mg by mouth daily. Take as directed for INR goal 2.0 - 3.0    . traMADol (ULTRAM) 50 MG tablet Take 1 tablet (50 mg total) by mouth every 8 (eight) hours as needed for moderate pain. 30 tablet 0   No current facility-administered medications for this visit.    Allergies:   Imdur and Metoprolol   Social History   Social History  . Marital Status: Single    Spouse Name: N/A  . Number of Children: 2  . Years of Education: N/A   Occupational History  . Disabled    Social History Main Topics  . Smoking status: Never Smoker   . Smokeless tobacco: Never Used  . Alcohol Use: No  . Drug Use: No  . Sexual Activity: Not Asked   Other Topics Concern  . None   Social History Narrative     Family History:  Negative for cardiac illness or other inheritable disorders  ROS:   Please see the history of present illness.    Review of Systems  All other systems reviewed and are negative.   PHYSICAL EXAM:   VS:  BP 72/70 mmHg  Pulse 72  Resp 16  Ht 6\' 2"  (1.88 m)  Wt 250 lb 3 oz (113.484 kg)  BMI 32.11 kg/m2   GEN: Well nourished, well developed, in no acute distress HEENT: normal Neck: no JVD, carotid bruits, or masses Cardiac: RRR; continuous LVAD machinery sounds; no murmurs, rubs, or gallops,no edema  Respiratory:  clear to auscultation bilaterally, normal work of breathing GI: soft, nontender, nondistended, + BS MS: no deformity or atrophy Skin: warm and dry, no rash Neuro:  Alert and Oriented x 3, Strength and sensation are intact Psych: euthymic mood, full affect  Wt  Readings from Last 3 Encounters:  04/09/15 250 lb 3 oz (113.484 kg)  03/04/15 245 lb 9.6 oz (111.403 kg)  02/25/15 249 lb (112.946 kg)      Studies/Labs Reviewed:   EKG:  EKG is ordered today.  The ekg ordered today demonstrates A sensed V sensed rhythm, occasional A paced V sensed  Recent Labs: 11/28/2014: ALT 69*; B Natriuretic Peptide 84.5 12/10/2014: Magnesium 2.0 01/28/2015: TSH 0.019* 04/09/2015: BUN 7; Creatinine, Ser 1.16; Hemoglobin 13.1; Platelets 172; Potassium 3.8; Sodium 139   Lipid Panel    Component Value Date/Time   CHOL 224* 09/23/2014 0942   TRIG 113 09/23/2014 0942   HDL 60 09/23/2014 0942   CHOLHDL 3.7 09/23/2014  0301   VLDL 23 09/23/2014 0942   LDLCALC 141* 09/23/2014 0942      ASSESSMENT:    1. VT (ventricular tachycardia) (HCC)   2. NICM (EF <15% on TTE 01/2012), normal cors at cath 4/11   3. Chronic combined systolic and diastolic CHF (congestive heart failure) (HCC)   4. Essential hypertension, benign   5. LVAD (left ventricular assist device) present (HCC)   6. ICD (implantable cardioverter-defibrillator) battery depletion   7. Disorder of lumbar spine      PLAN:  In order of problems listed above:  1. No VT events to speak of since VAD implantation, on mexiletine monotherapy 2. CMP, LVEF <10%: On highly effective vasodilator therapy, was beta blocker intolerant 3. CHF: Not requiring diuretics, appears euvolemic, excellent functional status 4. BP is virtually linear, little if any pulsatility, on LVAD 5. Normal LVAD function, followed by Dr. Gala Romney 6. ICD at [facility]. Since he has not required pacing or tachy therapies in years and since he will likely tolerate VT/VF well with mechanical support, the benefit of ICD changeout is questionable. The risk of infection and bleeding probably outweighs the device's benefit.  Since the device will reach EOL in a few weeks, we turned off tachy detection and therapies and reduced pacing therapy to backup VVI  30 bpm only. 7. He is aware that he needs to avoid NSAIDs due to bleeding risk. Gave him a supply of tramadol.   Medication Adjustments/Labs and Tests Ordered: Current medicines are reviewed at length with the patient today.  Concerns regarding medicines are outlined above.  Medication changes, Labs and Tests ordered today are listed below. Patient Instructions  Dr Royann Shivers has recommended making the following medication changes: START Tramadol 50 mg - take 1 tablet by mouth every 6-8 hours as needed.  Your physician recommends that you schedule a follow-up appointment in 1 year. You will receive a reminder letter in the mail two months in advance. If you don't receive a letter, please call our office to schedule the follow-up appointment.  If you need a refill on your cardiac medications before your next appointment, please call your pharmacy.      Joie Bimler, MD  04/11/2015 10:18 PM    [facility] [address] [place], [place], [place]  [address] Phone: [phone]; Fax: [phone]

## 2015-04-09 NOTE — Progress Notes (Signed)
Symptom  Yes  No  Details   Angina       x Activity:   Claudication       x How far:   Syncope       x When:  Stroke       x   Orthopnea       x How many pillows:  1 pillow  PND       x How often:  CPAP       N/A How many hrs:   Pedal edema       x   Abd fullness       x   N&V       x good appetite; intentional wt loss over last month  Diaphoresis       x When:  Bleeding      x   Urine color    light yellow  SOB       x Activity:   Palpitations        x      When: less frequent; "beats hard" somestimes  ICD shock       x   Hospitlizaitons       x When/where/why:  ED visit       x When/where/why:  Other MD       x When/who/why:    Activity            decreased due to palpitations  Fluid    No limitations  Diet    No limitations   Vital signs: HR: 84 MAP BP: 88 Auto cuff: 125/ O2 Sat: 98% Wt: 248 lbs Last wt: 245.6  lbs Ht:  6'2"  LVAD interrogation reveals:  Speed:  9400 Flow: 7.6 Power: 6.6w PI: 6.5 Alarms:  none Events: 2 - 4 daily outlier on 04/08/15 with 16 events--> wasn't feeling well.  Fixed speed:  9400 Low speed limit:  8800  11V battery status: Primary controller: replace 27 months              Secondary controller: replace 10 months   LVAD exit site:  Well healed and incorporated. The velour is fully implanted at exit site. Dressing dry and intact. No erythema or drainage. Stabilization device present and accurately applied. Driveline dressing is being changed q 3 - 5 days per sterile technique using Sorbaview dressing with biopatch on exit site. Pt does self dressing changes. Pt denies fever or chills.  Pt denies any alarms or VAD equipment issues. Pt is completing weekly and monthly maintenance for LVAD equipment. LVAD equipment check completed and is in good working order. Back-up equipment present. LVAD education done on emergency procedures and precautions and reviewed exit site care.  Encounter Details: Not feeling well today--has a cold that started a  few days ago. Denies any fevers or chills; nasal congestion. No wheezing or productive coughing. Was not feeling well at all yesterday-->17 PI events. Overall feeling OK; "Ups and downs." Financially becoming more difficult to make requested q4-6w trips to [facility]. Ran out of Mexilitine two days ago and has been without it for 24h. Will refill today.   INR 2.43 --> goal 2 - 3 with 325 ASA. See anticoag sheet.   LDH pending --> stable within established baseline.  MAP 88 today after starting the Entresto for BP management. Currently taking hydralazine 100 mg BID still. D/W Dr. Gala Romney and will increase Entresto to 97-103 dose BID in an attempt to decrease other medications.   Medtronic  device has reached RRT 02/21/15--> has an appointment with EP doc today to discuss replacing. With transplant status and LVAD D/W Dr. Gala Romney and will not replace; advised him to keep current appointment for one last evaluation from their team. Completed [employer] [facility] Alert paperwork today.   RTC in 2 m and in 2 weeks for BP check   Robert Alberts, RN VAD Coordinator   Office: [phone] 24/7 VAD Pager: [phone]

## 2015-04-11 ENCOUNTER — Encounter: Payer: Self-pay | Admitting: Cardiovascular Disease

## 2015-04-11 DIAGNOSIS — Z4502 Encounter for adjustment and management of automatic implantable cardiac defibrillator: Secondary | ICD-10-CM | POA: Insufficient documentation

## 2015-04-11 DIAGNOSIS — M5386 Other specified dorsopathies, lumbar region: Secondary | ICD-10-CM | POA: Insufficient documentation

## 2015-04-23 ENCOUNTER — Ambulatory Visit (HOSPITAL_COMMUNITY)
Admission: RE | Admit: 2015-04-23 | Discharge: 2015-04-23 | Disposition: A | Discharge status: Home / Self Care | Payer: Medicaid Other | Source: Ambulatory Visit | Attending: Cardiology | Admitting: Cardiology

## 2015-04-23 ENCOUNTER — Other Ambulatory Visit (HOSPITAL_COMMUNITY): Payer: Self-pay | Admitting: Infectious Diseases

## 2015-04-23 DIAGNOSIS — Z029 Encounter for administrative examinations, unspecified: Secondary | ICD-10-CM | POA: Diagnosis not present

## 2015-04-23 DIAGNOSIS — Z7901 Long term (current) use of anticoagulants: Secondary | ICD-10-CM

## 2015-04-25 ENCOUNTER — Other Ambulatory Visit (HOSPITAL_COMMUNITY): Payer: Self-pay | Admitting: *Deleted

## 2015-04-25 ENCOUNTER — Ambulatory Visit: Payer: Self-pay | Admitting: *Deleted

## 2015-04-25 ENCOUNTER — Ambulatory Visit (HOSPITAL_COMMUNITY)
Admission: RE | Admit: 2015-04-25 | Discharge: 2015-04-25 | Disposition: A | Discharge status: Home / Self Care | Payer: Medicaid Other | Source: Ambulatory Visit | Attending: Cardiology | Admitting: Cardiology

## 2015-04-25 DIAGNOSIS — Z5181 Encounter for therapeutic drug level monitoring: Secondary | ICD-10-CM | POA: Diagnosis not present

## 2015-04-25 DIAGNOSIS — Z7901 Long term (current) use of anticoagulants: Secondary | ICD-10-CM | POA: Diagnosis not present

## 2015-04-25 DIAGNOSIS — Z95811 Presence of heart assist device: Secondary | ICD-10-CM

## 2015-04-25 LAB — PROTIME-INR
INR: 2.22 — ABNORMAL HIGH (ref 0.00–1.49)
PROTHROMBIN TIME: 24.4 s — AB (ref 11.6–15.2)

## 2015-05-13 ENCOUNTER — Encounter: Payer: Self-pay | Admitting: Licensed Clinical Social Worker

## 2015-05-13 ENCOUNTER — Ambulatory Visit (HOSPITAL_COMMUNITY): Payer: Self-pay | Admitting: *Deleted

## 2015-05-13 ENCOUNTER — Ambulatory Visit (HOSPITAL_COMMUNITY)
Admission: RE | Admit: 2015-05-13 | Discharge: 2015-05-13 | Disposition: A | Discharge status: Home / Self Care | Payer: Medicaid Other | Source: Ambulatory Visit | Attending: Internal Medicine | Admitting: Internal Medicine

## 2015-05-13 ENCOUNTER — Other Ambulatory Visit (HOSPITAL_COMMUNITY): Payer: Self-pay | Admitting: *Deleted

## 2015-05-13 DIAGNOSIS — Z5181 Encounter for therapeutic drug level monitoring: Secondary | ICD-10-CM

## 2015-05-13 DIAGNOSIS — Z95811 Presence of heart assist device: Secondary | ICD-10-CM | POA: Insufficient documentation

## 2015-05-13 DIAGNOSIS — Z7901 Long term (current) use of anticoagulants: Secondary | ICD-10-CM | POA: Diagnosis not present

## 2015-05-13 DIAGNOSIS — I5023 Acute on chronic systolic (congestive) heart failure: Secondary | ICD-10-CM | POA: Diagnosis not present

## 2015-05-13 LAB — PROTIME-INR
INR: 1.37 (ref 0.00–1.49)
Prothrombin Time: 17 seconds — ABNORMAL HIGH (ref 11.6–15.2)

## 2015-05-13 NOTE — Progress Notes (Signed)
CSW met with patient in the clinic. Patient spoke about transplant and upcoming appointment at [facility]. Patient is currently on list at [facility] and [facility] and states he plans to doing the "heart in a box" program which might increase his percentage of getting a heart. Patient states all is well at home and managing financial needs independently. CSW provided support and will continue to follow for support as needed. Raquel Sarna, [place]

## 2015-05-15 ENCOUNTER — Telehealth (HOSPITAL_COMMUNITY): Payer: Self-pay | Admitting: *Deleted

## 2015-05-15 ENCOUNTER — Other Ambulatory Visit (HOSPITAL_COMMUNITY): Payer: Medicaid Other

## 2015-05-15 NOTE — Telephone Encounter (Signed)
Pt called to report he has been to [facility] today; will re-schedule labs (INR and LDH) for tomorrow. Pt is currently on Lovenox for subtherapeutic INR.

## 2015-05-16 ENCOUNTER — Telehealth (HOSPITAL_COMMUNITY): Payer: Self-pay | Admitting: Infectious Diseases

## 2015-05-16 ENCOUNTER — Inpatient Hospital Stay (HOSPITAL_COMMUNITY): Admission: RE | Admit: 2015-05-16 | Payer: Medicaid Other | Source: Ambulatory Visit

## 2015-05-16 ENCOUNTER — Ambulatory Visit (HOSPITAL_COMMUNITY): Payer: Self-pay | Admitting: Infectious Diseases

## 2015-05-16 ENCOUNTER — Ambulatory Visit (HOSPITAL_COMMUNITY)
Admission: RE | Admit: 2015-05-16 | Discharge: 2015-05-16 | Disposition: A | Discharge status: Home / Self Care | Payer: Medicaid Other | Source: Ambulatory Visit | Attending: Cardiology | Admitting: Cardiology

## 2015-05-16 DIAGNOSIS — Z95811 Presence of heart assist device: Secondary | ICD-10-CM

## 2015-05-16 DIAGNOSIS — Z5181 Encounter for therapeutic drug level monitoring: Secondary | ICD-10-CM | POA: Diagnosis not present

## 2015-05-16 DIAGNOSIS — Z7901 Long term (current) use of anticoagulants: Secondary | ICD-10-CM

## 2015-05-16 LAB — PROTIME-INR
INR: 1.98 — ABNORMAL HIGH (ref 0.00–1.49)
Prothrombin Time: 22.4 seconds — ABNORMAL HIGH (ref 11.6–15.2)

## 2015-05-16 LAB — LACTATE DEHYDROGENASE: LDH: 257 U/L — ABNORMAL HIGH (ref 98–192)

## 2015-05-16 NOTE — Telephone Encounter (Signed)
A user error has taken place: encounter opened in error, closed for administrative reasons.

## 2015-05-19 ENCOUNTER — Encounter (HOSPITAL_COMMUNITY): Payer: Self-pay | Admitting: *Deleted

## 2015-05-23 ENCOUNTER — Ambulatory Visit (HOSPITAL_COMMUNITY)
Admission: RE | Admit: 2015-05-23 | Discharge: 2015-05-23 | Disposition: A | Discharge status: Home / Self Care | Payer: Medicaid Other | Source: Ambulatory Visit | Attending: Cardiology | Admitting: Cardiology

## 2015-05-23 ENCOUNTER — Other Ambulatory Visit (HOSPITAL_COMMUNITY): Payer: Self-pay | Admitting: *Deleted

## 2015-05-23 DIAGNOSIS — I5023 Acute on chronic systolic (congestive) heart failure: Secondary | ICD-10-CM | POA: Diagnosis not present

## 2015-05-23 DIAGNOSIS — Z95811 Presence of heart assist device: Secondary | ICD-10-CM

## 2015-05-23 DIAGNOSIS — Z789 Other specified health status: Secondary | ICD-10-CM | POA: Insufficient documentation

## 2015-05-23 DIAGNOSIS — Z7901 Long term (current) use of anticoagulants: Secondary | ICD-10-CM

## 2015-05-26 ENCOUNTER — Ambulatory Visit (HOSPITAL_COMMUNITY): Payer: Self-pay | Admitting: *Deleted

## 2015-05-26 ENCOUNTER — Ambulatory Visit (HOSPITAL_COMMUNITY)
Admission: RE | Admit: 2015-05-26 | Discharge: 2015-05-26 | Disposition: A | Discharge status: Home / Self Care | Payer: Medicaid Other | Source: Ambulatory Visit | Attending: Cardiology | Admitting: Cardiology

## 2015-05-26 DIAGNOSIS — I5023 Acute on chronic systolic (congestive) heart failure: Secondary | ICD-10-CM

## 2015-05-26 DIAGNOSIS — Z95811 Presence of heart assist device: Secondary | ICD-10-CM | POA: Insufficient documentation

## 2015-05-26 DIAGNOSIS — Z5181 Encounter for therapeutic drug level monitoring: Secondary | ICD-10-CM | POA: Insufficient documentation

## 2015-05-26 DIAGNOSIS — Z7901 Long term (current) use of anticoagulants: Secondary | ICD-10-CM

## 2015-05-26 LAB — PROTIME-INR
INR: 2.09 — AB (ref 0.00–1.49)
PROTHROMBIN TIME: 23.4 s — AB (ref 11.6–15.2)

## 2015-06-02 ENCOUNTER — Other Ambulatory Visit (HOSPITAL_COMMUNITY): Payer: Self-pay | Admitting: Internal Medicine

## 2015-06-10 ENCOUNTER — Telehealth (HOSPITAL_COMMUNITY): Payer: Self-pay | Admitting: *Deleted

## 2015-06-10 ENCOUNTER — Encounter (HOSPITAL_COMMUNITY): Payer: Medicaid Other

## 2015-06-10 ENCOUNTER — Other Ambulatory Visit (HOSPITAL_COMMUNITY): Payer: Self-pay | Admitting: *Deleted

## 2015-06-10 DIAGNOSIS — Z95811 Presence of heart assist device: Secondary | ICD-10-CM

## 2015-06-10 DIAGNOSIS — Z7901 Long term (current) use of anticoagulants: Secondary | ICD-10-CM

## 2015-06-10 NOTE — Telephone Encounter (Signed)
Called pt re: missed INR appt today. Pt will come tomorrow for same.

## 2015-06-11 ENCOUNTER — Encounter: Payer: Self-pay | Admitting: Licensed Clinical Social Worker

## 2015-06-11 ENCOUNTER — Ambulatory Visit (HOSPITAL_COMMUNITY)
Admission: RE | Admit: 2015-06-11 | Discharge: 2015-06-11 | Disposition: A | Discharge status: Home / Self Care | Payer: Medicaid Other | Source: Ambulatory Visit | Attending: Internal Medicine | Admitting: Internal Medicine

## 2015-06-11 ENCOUNTER — Ambulatory Visit (HOSPITAL_COMMUNITY): Payer: Self-pay | Admitting: *Deleted

## 2015-06-11 DIAGNOSIS — I5023 Acute on chronic systolic (congestive) heart failure: Secondary | ICD-10-CM | POA: Diagnosis not present

## 2015-06-11 DIAGNOSIS — Z95811 Presence of heart assist device: Secondary | ICD-10-CM

## 2015-06-11 DIAGNOSIS — Z5181 Encounter for therapeutic drug level monitoring: Secondary | ICD-10-CM | POA: Insufficient documentation

## 2015-06-11 DIAGNOSIS — Z7901 Long term (current) use of anticoagulants: Secondary | ICD-10-CM | POA: Diagnosis not present

## 2015-06-11 LAB — PROTIME-INR
INR: 1.82 — ABNORMAL HIGH (ref 0.00–1.49)
PROTHROMBIN TIME: 21 s — AB (ref 11.6–15.2)

## 2015-06-11 NOTE — Progress Notes (Signed)
CSW met with patient in the clinic. Patient reports his furnance is not working properly and has limited resources for repairs. CSW made a few calls and left message at [employer] for possible assistance for home repairs. CSW awaiting return call and will follow up with patient. Robert [name], [place]

## 2015-06-16 ENCOUNTER — Telehealth: Payer: Self-pay | Admitting: Licensed Clinical Social Worker

## 2015-06-16 NOTE — Telephone Encounter (Signed)
CSW contacted patient to provide a community resource for home repairs. Patient reported that he was able to fix his furnace and does not need home repairs at the moment. Patient was grateful for the information and will seek in the future if needed. [name], LCSW [phone]

## 2015-06-19 ENCOUNTER — Emergency Department (HOSPITAL_COMMUNITY): Payer: Medicaid Other

## 2015-06-19 ENCOUNTER — Other Ambulatory Visit (HOSPITAL_COMMUNITY): Payer: Self-pay | Admitting: *Deleted

## 2015-06-19 ENCOUNTER — Ambulatory Visit (HOSPITAL_COMMUNITY)
Admission: RE | Admit: 2015-06-19 | Discharge: 2015-06-19 | Disposition: A | Discharge status: Home / Self Care | Payer: Medicaid Other | Source: Ambulatory Visit | Attending: Internal Medicine | Admitting: Internal Medicine

## 2015-06-19 ENCOUNTER — Ambulatory Visit (HOSPITAL_COMMUNITY): Payer: Self-pay | Admitting: *Deleted

## 2015-06-19 ENCOUNTER — Other Ambulatory Visit: Payer: Self-pay

## 2015-06-19 ENCOUNTER — Telehealth (HOSPITAL_COMMUNITY): Payer: Self-pay | Admitting: *Deleted

## 2015-06-19 ENCOUNTER — Emergency Department (HOSPITAL_COMMUNITY)
Admission: EM | Admit: 2015-06-19 | Discharge: 2015-06-19 | Disposition: A | Discharge status: Home / Self Care | Payer: Medicaid Other | Attending: Emergency Medicine | Admitting: Emergency Medicine

## 2015-06-19 ENCOUNTER — Encounter (HOSPITAL_COMMUNITY): Payer: Self-pay

## 2015-06-19 VITALS — BP 120/0 | HR 80 | Ht 74.0 in | Wt 254.0 lb

## 2015-06-19 DIAGNOSIS — Z79899 Other long term (current) drug therapy: Secondary | ICD-10-CM | POA: Diagnosis not present

## 2015-06-19 DIAGNOSIS — I472 Ventricular tachycardia: Secondary | ICD-10-CM | POA: Insufficient documentation

## 2015-06-19 DIAGNOSIS — Z95810 Presence of automatic (implantable) cardiac defibrillator: Secondary | ICD-10-CM | POA: Insufficient documentation

## 2015-06-19 DIAGNOSIS — I110 Hypertensive heart disease with heart failure: Secondary | ICD-10-CM | POA: Diagnosis not present

## 2015-06-19 DIAGNOSIS — R002 Palpitations: Secondary | ICD-10-CM | POA: Diagnosis not present

## 2015-06-19 DIAGNOSIS — I272 Other secondary pulmonary hypertension: Secondary | ICD-10-CM | POA: Insufficient documentation

## 2015-06-19 DIAGNOSIS — M109 Gout, unspecified: Secondary | ICD-10-CM

## 2015-06-19 DIAGNOSIS — R61 Generalized hyperhidrosis: Secondary | ICD-10-CM | POA: Diagnosis not present

## 2015-06-19 DIAGNOSIS — I5022 Chronic systolic (congestive) heart failure: Secondary | ICD-10-CM | POA: Diagnosis present

## 2015-06-19 DIAGNOSIS — Z7982 Long term (current) use of aspirin: Secondary | ICD-10-CM | POA: Insufficient documentation

## 2015-06-19 DIAGNOSIS — R0600 Dyspnea, unspecified: Secondary | ICD-10-CM | POA: Diagnosis not present

## 2015-06-19 DIAGNOSIS — Z7901 Long term (current) use of anticoagulants: Secondary | ICD-10-CM | POA: Insufficient documentation

## 2015-06-19 DIAGNOSIS — Z8709 Personal history of other diseases of the respiratory system: Secondary | ICD-10-CM | POA: Insufficient documentation

## 2015-06-19 DIAGNOSIS — M10012 Idiopathic gout, left shoulder: Secondary | ICD-10-CM | POA: Diagnosis not present

## 2015-06-19 DIAGNOSIS — I10 Essential (primary) hypertension: Secondary | ICD-10-CM

## 2015-06-19 DIAGNOSIS — Z95811 Presence of heart assist device: Secondary | ICD-10-CM

## 2015-06-19 DIAGNOSIS — I509 Heart failure, unspecified: Secondary | ICD-10-CM | POA: Insufficient documentation

## 2015-06-19 DIAGNOSIS — M2550 Pain in unspecified joint: Secondary | ICD-10-CM

## 2015-06-19 DIAGNOSIS — E0590 Thyrotoxicosis, unspecified without thyrotoxic crisis or storm: Secondary | ICD-10-CM | POA: Insufficient documentation

## 2015-06-19 DIAGNOSIS — Z8701 Personal history of pneumonia (recurrent): Secondary | ICD-10-CM | POA: Insufficient documentation

## 2015-06-19 DIAGNOSIS — M25512 Pain in left shoulder: Secondary | ICD-10-CM | POA: Diagnosis not present

## 2015-06-19 DIAGNOSIS — Z9889 Other specified postprocedural states: Secondary | ICD-10-CM | POA: Insufficient documentation

## 2015-06-19 DIAGNOSIS — M2560 Stiffness of unspecified joint, not elsewhere classified: Secondary | ICD-10-CM

## 2015-06-19 DIAGNOSIS — E079 Disorder of thyroid, unspecified: Secondary | ICD-10-CM

## 2015-06-19 DIAGNOSIS — Z8679 Personal history of other diseases of the circulatory system: Secondary | ICD-10-CM

## 2015-06-19 LAB — CBC
HCT: 40.9 % (ref 39.0–52.0)
HCT: 40.7 % (ref 39.0–52.0)
Hemoglobin: 13.1 g/dL (ref 13.0–17.0)
Hemoglobin: 13.1 g/dL (ref 13.0–17.0)
MCH: 28.5 pg (ref 26.0–34.0)
MCH: 28.4 pg (ref 26.0–34.0)
MCHC: 32 g/dL (ref 30.0–36.0)
MCHC: 32.2 g/dL (ref 30.0–36.0)
MCV: 89.1 fL (ref 78.0–100.0)
MCV: 88.3 fL (ref 78.0–100.0)
PLATELETS: 146 10*3/uL — AB (ref 150–400)
PLATELETS: 142 10*3/uL — AB (ref 150–400)
RBC: 4.59 MIL/uL (ref 4.22–5.81)
RBC: 4.61 MIL/uL (ref 4.22–5.81)
RDW: 15.2 % (ref 11.5–15.5)
RDW: 15.1 % (ref 11.5–15.5)
WBC: 6.4 10*3/uL (ref 4.0–10.5)
WBC: 6.8 10*3/uL (ref 4.0–10.5)

## 2015-06-19 LAB — PROTIME-INR
INR: 2.76 — AB (ref 0.00–1.49)
INR: 3.06 — AB (ref 0.00–1.49)
INR: 3.1 — AB (ref <=1.1)
PROTHROMBIN TIME: 31 s — AB (ref 11.6–15.2)
PROTHROMBIN TIME: 28.7 s — AB (ref 11.6–15.2)

## 2015-06-19 LAB — BASIC METABOLIC PANEL
Anion gap: 12 (ref 5–15)
BUN: 10 mg/dL (ref 6–20)
CHLORIDE: 107 mmol/L (ref 101–111)
CO2: 21 mmol/L — ABNORMAL LOW (ref 22–32)
CREATININE: 1.22 mg/dL (ref 0.61–1.24)
Calcium: 9.2 mg/dL (ref 8.9–10.3)
Glucose, Bld: 99 mg/dL (ref 65–99)
POTASSIUM: 3.7 mmol/L (ref 3.5–5.1)
SODIUM: 140 mmol/L (ref 135–145)

## 2015-06-19 LAB — COMPREHENSIVE METABOLIC PANEL
ALT: 14 U/L — AB (ref 17–63)
ANION GAP: 12 (ref 5–15)
AST: 17 U/L (ref 15–41)
Albumin: 3.8 g/dL (ref 3.5–5.0)
Alkaline Phosphatase: 88 U/L (ref 38–126)
BUN: 12 mg/dL (ref 6–20)
CHLORIDE: 106 mmol/L (ref 101–111)
CO2: 24 mmol/L (ref 22–32)
CREATININE: 1.15 mg/dL (ref 0.61–1.24)
Calcium: 9.2 mg/dL (ref 8.9–10.3)
Glucose, Bld: 98 mg/dL (ref 65–99)
Potassium: 3.7 mmol/L (ref 3.5–5.1)
SODIUM: 142 mmol/L (ref 135–145)
Total Bilirubin: 0.7 mg/dL (ref 0.3–1.2)
Total Protein: 6.6 g/dL (ref 6.5–8.1)

## 2015-06-19 LAB — BRAIN NATRIURETIC PEPTIDE: B NATRIURETIC PEPTIDE 5: 66.3 pg/mL (ref 0.0–100.0)

## 2015-06-19 LAB — I-STAT TROPONIN, ED
TROPONIN I, POC: 0 ng/mL (ref 0.00–0.08)
Troponin i, poc: 0.01 ng/mL (ref 0.00–0.08)

## 2015-06-19 LAB — LACTATE DEHYDROGENASE: LDH: 237 U/L — AB (ref 98–192)

## 2015-06-19 MED ORDER — HYDROCODONE-ACETAMINOPHEN 5-325 MG PO TABS: 1.0000 | ORAL_TABLET | ORAL | Status: DC | PRN

## 2015-06-19 MED ORDER — ALLOPURINOL 300 MG PO TABS: 300.0000 mg | ORAL_TABLET | ORAL | Status: DC

## 2015-06-19 MED ORDER — PREDNISONE 20 MG PO TABS
40.0000 mg | ORAL_TABLET | Freq: Once | ORAL | Status: AC
  Administered 2015-06-19: 40 mg via ORAL
  Filled 2015-06-19: qty 2

## 2015-06-19 MED ORDER — PREDNISONE 20 MG PO TABS: 40.0000 mg | ORAL_TABLET | Freq: Every day | ORAL | Status: DC

## 2015-06-19 MED ORDER — ASPIRIN 81 MG PO CHEW
324.0000 mg | CHEWABLE_TABLET | Freq: Once | ORAL | Status: AC
Start: 2015-06-19 — End: 2015-06-19
  Administered 2015-06-19: 324 mg via ORAL
  Filled 2015-06-19: qty 4

## 2015-06-19 MED ORDER — HYDROMORPHONE HCL 1 MG/ML IJ SOLN
1.0000 mg | Freq: Once | INTRAMUSCULAR | Status: AC
  Administered 2015-06-19: 1 mg via INTRAVENOUS
  Filled 2015-06-19: qty 1

## 2015-06-19 NOTE — ED Notes (Signed)
MD at the bedside  

## 2015-06-19 NOTE — ED Notes (Signed)
Pt returned from X-ray.  

## 2015-06-19 NOTE — ED Notes (Signed)
Phlebotomy at the bedside  

## 2015-06-19 NOTE — ED Notes (Signed)
Pt has LVAD looking for room now.  Charge nurse aware.

## 2015-06-19 NOTE — ED Notes (Signed)
Pt departed in NAD.  

## 2015-06-19 NOTE — Discharge Instructions (Signed)

## 2015-06-19 NOTE — ED Provider Notes (Signed)
CSN: [id number]     Arrival date & time 06/19/15  1612 History   First MD Initiated Contact with Patient 06/19/15 1656     Chief Complaint  Patient presents with  . Shoulder Pain  . LVAD patient    HPI Pt has been having pain in his left shoulder for a few weeks.  He has not mentioned it to his doctors.  The pain is increasing so he decided to come to the ED.  The pain stays in the left shoulder but he also has pain in the hip.  The pain is severe.  It increases with movement.  He does have a history of gout involving his toes.  He hasn't had trouble in his shoulder.  He stopped taking the allopurinol when it started to feel better. Past Medical History  Diagnosis Date  . AICD (automatic cardioverter/defibrillator) present 2011  . Hypertension 2000  . Congestive heart failure (HCC) 2011  . Pneumonia   . Bronchitis   . Pulmonary hypertension [facility])    Past Surgical History  Procedure Laterality Date  . Cardiac defibrillator placement  06/03/11  . Cardiac catheterization    . Colonoscopy  12/29/2011    Procedure: COLONOSCOPY;  Surgeon: Iva Boop, MD;  Location: [facility] ENDOSCOPY;  Service: Endoscopy;  Laterality: N/A;  . Insertion of implantable left ventricular assist device N/A 06/02/2012    Procedure: INSERTION OF IMPLANTABLE LEFT VENTRICULAR ASSIST DEVICE;  Surgeon: Kerin Perna, MD;  Location: [facility] OR;  Service: Open Heart Surgery;  Laterality: N/A;  Nitric Oxide; TEE; Medtronic AICD  . Intraoperative transesophageal echocardiogram N/A 06/02/2012    Procedure: INTRAOPERATIVE TRANSESOPHAGEAL ECHOCARDIOGRAM;  Surgeon: Kerin Perna, MD;  Location: [facility] OR;  Service: Open Heart Surgery;  Laterality: N/A;  . Left and right heart catheterization with coronary angiogram N/A 08/17/2011    Procedure: LEFT AND RIGHT HEART CATHETERIZATION WITH CORONARY ANGIOGRAM;  Surgeon: Thurmon Fair, MD;  Location: [facility] CATH LAB;  Service: Cardiovascular;  Laterality: N/A;  . Right heart catheterization N/A  05/30/2012    Procedure: RIGHT HEART CATH;  Surgeon: Dolores Patty, MD;  Location: [facility] CATH LAB;  Service: Cardiovascular;  Laterality: N/A;  . Intra-aortic balloon pump insertion N/A 05/31/2012    Procedure: INTRA-AORTIC BALLOON PUMP INSERTION;  Surgeon: Dolores Patty, MD;  Location: [facility] CATH LAB;  Service: Cardiovascular;  Laterality: N/A;  . Right heart catheterization N/A 06/10/2014    Procedure: RIGHT HEART CATH;  Surgeon: Dolores Patty, MD;  Location: [facility] CATH LAB;  Service: Cardiovascular;  Laterality: N/A;  . Cardiac catheterization N/A 11/04/2014    Procedure: Right Heart Cath;  Surgeon: Dolores Patty, MD;  Location: [facility] INVASIVE CV LAB;  Service: Cardiovascular;  Laterality: N/A;   No family history on file. Social History  Substance Use Topics  . Smoking status: Never Smoker   . Smokeless tobacco: Never Used  . Alcohol Use: No    Review of Systems  Constitutional: Positive for diaphoresis. Negative for fever.  Respiratory: Negative for shortness of breath and wheezing.   Cardiovascular: Negative for chest pain.  Skin: Negative for rash and wound.  All other systems reviewed and are negative.     Allergies  Imdur and Metoprolol  Home Medications   Prior to Admission medications   Medication Sig Start Date End Date Taking? Authorizing Provider  amLODipine (NORVASC) 10 MG tablet Take 1 tablet (10 mg total) by mouth daily. 05/15/14  Yes Dolores Patty, MD  aspirin EC 325  MG tablet Take 1 tablet (325 mg total) by mouth daily. 04/30/13  Yes Dolores Patty, MD  atorvastatin (LIPITOR) 20 MG tablet Take 1 tablet (20 mg total) by mouth daily. 05/15/14  Yes Amy D Clegg, NP  doxazosin (CARDURA) 8 MG tablet Take 1 tablet (8 mg total) by mouth daily. 02/05/15  Yes Dolores Patty, MD  hydrALAZINE (APRESOLINE) 100 MG tablet Take 1 tablet (100 mg total) by mouth 2 (two) times daily. Patient taking differently: Take 100 mg by mouth 2 (two) times daily.  11/12/14  Yes  Dolores Patty, MD  methimazole (TAPAZOLE) 10 MG tablet Take 2 tablets (20 mg total) by mouth 2 (two) times daily. 12/17/14  Yes Dolores Patty, MD  mexiletine (MEXITIL) 150 MG capsule Take 1 capsule (150 mg total) by mouth 2 (two) times daily. 11/18/14  Yes Bevelyn Buckles Bensimhon, MD  pantoprazole (PROTONIX) 40 MG tablet Take 1 tablet (40 mg total) by mouth daily. 06/05/14  Yes Bevelyn Buckles Bensimhon, MD  sacubitril-valsartan (ENTRESTO) 97-103 MG Take 1 tablet by mouth 2 (two) times daily. 04/09/15  Yes Dolores Patty, MD  traMADol (ULTRAM) 50 MG tablet Take 1 tablet (50 mg total) by mouth every 8 (eight) hours as needed for moderate pain. 04/09/15  Yes Mihai Croitoru, MD  warfarin (COUMADIN) 5 MG tablet Take 5 mg by mouth daily. Take as directed for INR goal 2.0 - 3.0   Yes Historical Provider, MD  allopurinol (ZYLOPRIM) 300 MG tablet Take 1 tablet (300 mg total) by mouth 2 (two) times a week. Reported on 06/19/2015 06/19/15   Linwood Dibbles, MD  HYDROcodone-acetaminophen (NORCO/VICODIN) 5-325 MG tablet Take 1 tablet by mouth every 4 (four) hours as needed. 06/19/15   Linwood Dibbles, MD  predniSONE (DELTASONE) 20 MG tablet Take 2 tablets (40 mg total) by mouth daily. 06/19/15   Linwood Dibbles, MD   BP 103/81 mmHg  Pulse 83  Temp(Src) 97.8 F (36.6 C) (Oral)  Resp 28  SpO2 94% Physical Exam  Constitutional: He appears well-developed and well-nourished. No distress.  HENT:  Head: Normocephalic and atraumatic.  Right Ear: External ear normal.  Left Ear: External ear normal.  Eyes: Conjunctivae are normal. Right eye exhibits no discharge. Left eye exhibits no discharge. No scleral icterus.  Neck: Neck supple. No tracheal deviation present.  Cardiovascular: Normal rate, regular rhythm and intact distal pulses.   Mechanical lvad sound  Pulmonary/Chest: Effort normal and breath sounds normal. No stridor. No respiratory distress. He has no wheezes. He has no rales.  Abdominal: Soft. Bowel sounds are normal. He  exhibits no distension. There is no tenderness. There is no rebound and no guarding.  Musculoskeletal: He exhibits no edema.       Left shoulder: He exhibits decreased range of motion, tenderness and bony tenderness. He exhibits no swelling and no deformity.       Left elbow: Normal.       Cervical back: Normal.  Neurological: He is alert. He has normal strength. No cranial nerve deficit (no facial droop, extraocular movements intact, no slurred speech) or sensory deficit. He exhibits normal muscle tone. He displays no seizure activity. Coordination normal.  Skin: Skin is warm and dry. No rash noted.  Psychiatric: He has a normal mood and affect.  Nursing note and vitals reviewed.   ED Course  Procedures (including critical care time) Labs Review Labs Reviewed  BASIC METABOLIC PANEL - Abnormal; Notable for the following:    CO2 21 (*)  All other components within normal limits  CBC - Abnormal; Notable for the following:    Platelets 142 (*)    All other components within normal limits  PROTIME-INR - Abnormal; Notable for the following:    Prothrombin Time 28.7 (*)    INR 2.76 (*)    All other components within normal limits  APTT  I-STAT TROPOININ, ED  I-STAT TROPOININ, ED  Rosezena Sensor, ED    Imaging Review Dg Chest 2 View  06/19/2015  CLINICAL DATA:  Sharp superior left shoulder pain for 2 weeks, increasing today with movement. History of hypertension, multiple heart conditions, cardiac surgeries. EXAM: CHEST  2 VIEW COMPARISON:  Chest x-ray dated 09/23/2014. FINDINGS: Cardiomegaly is stable. Median sternotomy wires appear intact and stable in alignment. Left chest wall pacemaker/AICD is stable in position. Left ventricular assist device appears stable in position. Lungs are clear. No evidence of CHF. No evidence of pneumonia. No pleural effusion. Osseous structures about the chest are unremarkable. IMPRESSION: Stable chest x-ray. No evidence of acute cardiopulmonary  abnormality. Electronically Signed   By: Bary Richard M.D.   On: 06/19/2015 17:39   Dg Shoulder Left  06/19/2015  CLINICAL DATA:  Upper left shoulder pain for 2 weeks, increasing today with movement. EXAM: LEFT SHOULDER - 2+ VIEW COMPARISON:  06/19/2015. FINDINGS: Internally rotated view of the left shoulder demonstrates expected glenohumeral alignment with mild spurring along the glenoid rim. Mild spurring of the [facility] joint noted inferiorly. Subacromial morphology is type 2 (curved). Note is made of components of the left ventricular assist device and AICD. IMPRESSION: 1. No fracture, dislocation, or acute bony findings. 2. Mild degenerative glenohumeral and AC joint arthropathy. Electronically Signed   By: Gaylyn Rong M.D.   On: 06/19/2015 17:39   I have personally reviewed and evaluated these images and lab results as part of my medical decision-making.   EKG Interpretation   Date/Time:  Thursday June 19 2015 16:35:49 EDT Ventricular Rate:  66 PR Interval:  174 QRS Duration: 108 QT Interval:  438 QTC Calculation: 459 R Axis:   -117 Text Interpretation:  Sinus rhythm with occasional Premature ventricular  complexes Right superior axis deviation Incomplete right bundle branch  block Right ventricular hypertrophy Septal infarct , age undetermined  Abnormal ECG axis change since prior tracing Confirmed by Levorn Oleski  MD-J, Draven Laine  ([id number]) on 06/19/2015 5:11:31 PM     Medications  predniSONE (DELTASONE) tablet 40 mg (not administered)  aspirin chewable tablet 324 mg (324 mg Oral Given 06/19/15 1743)  HYDROmorphone (DILAUDID) injection 1 mg (1 mg Intravenous Given 06/19/15 1744)  HYDROmorphone (DILAUDID) injection 1 mg (1 mg Intravenous Given 06/19/15 1834)    MDM   Final diagnoses:  Shoulder pain, acute, left  Acute gout of left shoulder, unspecified cause    Symptoms seem to be related to the left shoulder joint. I doubt this is referred pain from a cardiac or vascular etiology.  Patient does have a history of gout and stopped taking his allopurinol. He has mentioned some discomfort in other joints and I think this could be related to the pain today.  Discussed with Dr Jones Broom.  Will start him on oral prednisone.  Pt should start back on his allopurinol after that.    Linwood Dibbles, MD 06/19/15 2013

## 2015-06-19 NOTE — Patient Instructions (Addendum)
1.  No change in medications. 2.  Will call you with INR results and coumadin instructions.  3.  Will refer you to physical therapy for evaluation of joint aches. 4.  Will check thyroid panel at next lab visit.  5.  Return to VAD clinic in 2 months.

## 2015-06-19 NOTE — Progress Notes (Signed)
Symptom  Yes  No  Details   Angina       x Activity:   Claudication       x How far:   Syncope       x When:  Stroke       x   Orthopnea       x How many pillows:  1 pillow  PND       x How often:  CPAP       N/A How many hrs:   Pedal edema       x   Abd fullness       x   N&V       x good appetite  Diaphoresis       x When:  Bleeding      x   Urine color    light yellow  SOB       x Activity:   Palpitations        x      When: less frequent; "beats hard" somestimes  ICD shock       x   Hospitlizaitons       x When/where/why:  ED visit       x When/where/why:  Other MD       x When/who/why:    Activity            decreased due to palpitations  Fluid    No limitations  Diet    No limitations   Vital signs: HR: 80 MAP BP: 120 Auto cuff: 123/78 (97) O2 Sat: 100% Wt: 254 lbs Last wt: 2484.4 lbs Ht:  6'2"  LVAD interrogation reveals:  Speed:  9400 Flow: 4.4 Power: 5.1w PI: 6.8 Alarms:  none  Events: 0 - 5 daily with outliers 20/day  Fixed speed:  9400 Low speed limit:  8800  11V battery status: Primary controller: replace 25 months              Secondary controller: replace 26  months   LVAD exit site:  Well healed and incorporated. The velour is fully implanted at exit site. Dressing dry and intact. No erythema or drainage. Stabilization device present and accurately applied. Driveline dressing is being changed q 3 - 5 days per sterile technique using Sorbaview dressing with biopatch on exit site. Pt does self dressing changes. Pt denies fever or chills.  Pt denies any alarms or VAD equipment issues. Pt is completing weekly and monthly maintenance for LVAD equipment. LVAD equipment check completed and is in good working order. Back-up equipment present. LVAD education done on emergency procedures and precautions and reviewed exit site care.  Encounter Details: Not feeling well today--states he is having increased overall joint pain and decreased range of motion in left  shoulder. Overall, states he has good days and bad days.   C/O increased fatigue, less energy. Reports he saw Dr. Evlyn Kanner @ one month ago and his Tapazole was stopped at that visit. Has f/u with Dr. Evlyn Kanner in one month and asked if we could get thyroid panel next lab visit and fax to endocrinologist.     INR  3.1--> goal 2 - 3 with 325 ASA. See anticoag sheet.   LDH 237  --> stable within established baseline.  MAP 97 today after starting the Entresto for BP management. States he did not take meds this am and took cough medicine last night. Also reports he has only been taking Hydralazine 100 mg one time dailly;  states it "wears me down" and makes him feel more tired.  Explained if he takes the [facility] as prescribed, he may be able to titrate off some of the other meds.  Pt states he will "pick up meds at drug store today and get back on track".    Medtronic device has reached ERI; therapies off, VVI 30 per Dr.CROITORU.  Reviewed and demonstrated the following to patient and caregiver:               Reviewed the steps for replacing the running system controller with the back-up system controller (see patient handbook section 2 or the appropriate pamphlet).             X  Demonstrated (using the mock-driveline and controller) how to connect and disconnect the mock-driveline in back up system controller in a timely manner (less than 10 seconds) with return demonstration by patient and caregiver.              X   Reviewed system controller alarms and troubleshooting including hazard and advisory alarms and accessing alarm history. Re-enforced NOT TO attempt to perform any task displayed on the display screen alone or without calling the VAD pager [phone] (see patient handbook section 5 or the appropriate pamphlet).                       X  Reviewed how to handle an emergency including when the pump is running and when the pump has stopped (see patient handbook section 8). Call 911 first  and then the VAD pager at [phone].             X   Reviewed 14-volt lithium ion battery calibration steps (see patient handbook section 3).            X   Reviewed contents of black bag and what must be with patient at all times.            X  Reviewed driveline exit site including cleansing, dressing, and immobilizing with an anchor device to prevent exit site trauma.             X  Reviewed weekly maintenance which includes: Reviewing "Replacing the Running System Controller with a [employer]" pamphlet. Clean batteries, clips, and battery charger contacts.  Check cables for damages. Rotate batteries; keep all 8 charged.              X  Reviewed monthly maintenance which includes: Reviewing Alarms and Troubleshooting. Check battery manufacturer dates. Check use/charge cycles for each battery; remember to re-calibrate every 70 uses when prompted.              X  Additional pamphlets Guide to Replacing the Running System Controller with the Backup Controller for Patients and Their Caregivers and HM II Alarms for Patients and Their Caregivers given to patient.              X   Batteries Manufacture Date: Number of uses: Re-calibration  Feb 2012 - replaced 360 - 366 replaced Performed by patient   Annual maintenance completed per [employer] on patient's home power module and Warehouse manager.    Backup system controller 11 volt battery charged during visit.   3 year Intermacs follow up completed including:  Quality of Life, KCCQ-12, and Neurocognitive trail making.   Pt declined 6 minute walk today due to joint pain.    Hessie Diener, RN VAD Coordinator  Office:  [phone] 24/7 VAD Pager: [phone]

## 2015-06-19 NOTE — ED Notes (Signed)
Patient here with c/o left shoulder pain and dizziness. He is an LVAD patient.  This RN spoke with Kirt Boys the LVAD RN. She states he was just seen in the clinic earlier today for a regular visit and he mentioned the left shoulder pain after sleeping on his shoulder. She states his LVAD settings are fine. She states if the EDP has any questions to page Dr. Milas Kocher at [phone].

## 2015-06-19 NOTE — ED Notes (Signed)
Patient advised triage staff he has LVAD.  Did not advise nurse 1st.   Patient also advised that his truck needs to be moved by security.   Keys are at nurse first until security arrives.

## 2015-06-20 NOTE — Telephone Encounter (Signed)
Pt called VAD pager to report after sleeping on left side, he woke with severe left shoulder pain. Reports he took Tramadol for pain with no relief. Instructed pt per Dr. Gala Romney to report to ED for evaluation. VAD coordinator spoke with ED charge re: patient's arrival and per Dr. Gala Romney, pt will not need VAD cart for VAD parameter monitoring. We saw pt earlier today and did full VAD evaluation and no pump issues were identified. ED is asked to evaluate shoulder pain only, Dr. Gala Romney will call ED physician and update. Nurse and pt verbalized understanding of above.

## 2015-06-22 DIAGNOSIS — M25512 Pain in left shoulder: Secondary | ICD-10-CM | POA: Insufficient documentation

## 2015-06-22 NOTE — Progress Notes (Signed)
Patient ID: Robert [name], male   DOB: 03-Jan-1956, 60 y.o.   MRN: [id number]  LVAD CLINIC NOTE  Patient ID: Robert [name], male   DOB: [birth year], 60 y.o.   MRN: [id number] PCP: N/A HF: [place]    HPI: 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 status post dual chamber Medtronic ICD implanted April 2011. Cath 2011 showed normal coronaries. He also has a history of VTach . Currently listed as 1b list for heart tx at [facility].   Had [place] on 11/04/14 as part of dual listing process at [facility]. Normal filling pressures and cardiac output. Amio recently stopped due to thyrotoxicity. Placed on prednisone and tapazole. Had VT off amio and mexilitene started.  He is here for routine f/u.  Overall continues to feel well with VAD speed down at 9600 however today c/o joint pain particularly in left shoulder with limited ROM. Denies injury. Also has nasal congestion.  No SOB, edema. 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 Dual [facility] at [facility] Recently switched to [facility] due to severe HTN despite multi-drug regimen. Did not take this am because he wasn't feeling well. Also skipped hydralazine. ICD at EOL - decided not to replace.    Reports taking Coumadin as prescribed and adherence to anticoagulation based dietary restrictions.  Denies bright red blood per rectum or melena, no dark urine or hematuria.     LVAD interrogation reveals:  Speed: 9400 Flow: 4.4 Power: 5.1w PI: 6.8 Alarms: none  Events: 0 - 5 daily with outliers 20/day  Fixed speed: 9400 Low speed limit: 8800  I reviewed the LVAD parameters from today, and compared the results to the patient's prior recorded data.  No programming changes were made.  The LVAD is functioning within specified parameters.  The patient performs LVAD self-test daily.  LVAD interrogation was negative for any  significant power changes, alarms or PI events/speed drops.  LVAD equipment check completed and is in good working order.  Back-up equipment present.   LVAD education done on emergency procedures and precautions and reviewed exit site care.    Past Medical History  Diagnosis Date  . AICD (automatic cardioverter/defibrillator) present 2011  . Hypertension 2000  . Congestive heart failure ([place]) 2011  . Pneumonia   . Bronchitis   . Pulmonary hypertension ([place])     Current Outpatient Prescriptions  Medication Sig Dispense Refill  . amLODipine (NORVASC) 10 MG tablet Take 1 tablet (10 mg total) by mouth daily. 90 tablet 3  . aspirin EC 325 MG tablet Take 1 tablet (325 mg total) by mouth daily. 30 tablet 6  . atorvastatin (LIPITOR) 20 MG tablet Take 1 tablet (20 mg total) by mouth daily. 90 tablet 3  . doxazosin (CARDURA) 8 MG tablet Take 1 tablet (8 mg total) by mouth daily. 30 tablet 3  . hydrALAZINE (APRESOLINE) 100 MG tablet Take 1 tablet (100 mg total) by mouth 2 (two) times daily. (Patient taking differently: Take 100 mg by mouth 2 (two) times daily. ) 30 tablet 6  . mexiletine (MEXITIL) 150 MG capsule Take 1 capsule (150 mg total) by mouth 2 (two) times daily. 60 capsule 11  . pantoprazole (PROTONIX) 40 MG tablet Take 1 tablet (40 mg total) by mouth daily. 30 tablet 6  . sacubitril-valsartan (ENTRESTO) 97-103 MG Take 1 tablet by mouth 2 (two)  times daily. 60 tablet 6  . traMADol (ULTRAM) 50 MG tablet Take 1 tablet (50 mg total) by mouth every 8 (eight) hours as needed for moderate pain. 30 tablet 0  . warfarin (COUMADIN) 5 MG tablet Take 5 mg by mouth daily. Take as directed for INR goal 2.0 - 3.0    . allopurinol (ZYLOPRIM) 300 MG tablet Take 1 tablet (300 mg total) by mouth 2 (two) times a week. Reported on 06/19/2015 30 tablet 0  . HYDROcodone-acetaminophen (NORCO/VICODIN) 5-325 MG tablet Take 1 tablet by mouth every 4 (four) hours as needed. 16 tablet 0  . methimazole (TAPAZOLE) 10 MG  tablet Take 2 tablets (20 mg total) by mouth 2 (two) times daily. 60 tablet 1  . predniSONE (DELTASONE) 20 MG tablet Take 2 tablets (40 mg total) by mouth daily. 6 tablet 0   No current facility-administered medications for this encounter.    Imdur and Metoprolol  REVIEW OF SYSTEMS: All systems negative except as listed in HPI, PMH and Problem list.  Vital signs:  Vital signs: HR: 84 MAP BP: 88 O2 Sat: 98% Wt: 248 lbs Last wt: 245.6 lbs Ht: 6'2"   Physical Exam: GENERAL: Fatigued appearing, male who presents to clinic today in no acute distress. HEENT: normal  NECK: Supple, JVP flat;  2+ bilaterally, no bruits.  No lymphadenopathy or thyromegaly appreciated.   CARDIAC:  Mechanical heart sounds with LVAD hum present.  LUNGS:  Clear to auscultation bilaterally.  ABDOMEN:  Soft, round, nontender, positive bowel sounds x4.     LVAD exit site: well-healed and incorporated.  Dressing dry and intact.  No erythema or drainage.  Stabilization device present and accurately applied.  Driveline dressing is being changed daily per sterile technique.Driveline itself with multiple tears and twists EXTREMITIES:  Warm and dry, no cyanosis, clubbing, rash or edema  L shoulder sore limited ROM NEUROLOGIC:  Alert and oriented x 4.  Gait steady.  No aphasia.  No dysarthria.  Affect pleasant.     Recent Results (from the past 2160 hour(s))  Basic metabolic panel     Status: None   Collection Time: 04/09/15 12:21 PM  Result Value Ref Range   Sodium 139 135 - 145 mmol/L   Potassium 3.8 3.5 - 5.1 mmol/L   Chloride 105 101 - 111 mmol/L   CO2 27 22 - 32 mmol/L   Glucose, Bld 95 65 - 99 mg/dL   BUN 7 6 - 20 mg/dL   Creatinine, Ser 1.16 0.61 - 1.24 mg/dL   Calcium 9.2 8.9 - 10.3 mg/dL   GFR calc non Af Amer >60 >60 mL/min   GFR calc Af Amer >60 >60 mL/min    Comment: (NOTE) The eGFR has been calculated using the CKD EPI equation. This calculation has not been validated in all clinical  situations. eGFR's persistently <60 mL/min signify possible Chronic Kidney Disease.    Anion gap 7 5 - 15  CBC     Status: None   Collection Time: 04/09/15 12:21 PM  Result Value Ref Range   WBC 6.4 4.0 - 10.5 K/uL   RBC 4.70 4.22 - 5.81 MIL/uL   Hemoglobin 13.1 13.0 - 17.0 g/dL   HCT 40.9 39.0 - 52.0 %   MCV 87.0 78.0 - 100.0 fL   MCH 27.9 26.0 - 34.0 pg   MCHC 32.0 30.0 - 36.0 g/dL   RDW 14.8 11.5 - 15.5 %   Platelets 172 150 - 400 K/uL  Protime-INR  Status: Abnormal   Collection Time: 04/09/15 12:21 PM  Result Value Ref Range   Prothrombin Time 26.2 (H) 11.6 - 15.2 seconds   INR 2.43 (H) 0.00 - 1.49  Lactate dehydrogenase     Status: Abnormal   Collection Time: 04/09/15 12:21 PM  Result Value Ref Range   LDH 225 (H) 98 - 192 U/L  Protime-INR     Status: Abnormal   Collection Time: 04/25/15 11:00 AM  Result Value Ref Range   Prothrombin Time 24.4 (H) 11.6 - 15.2 seconds   INR 2.22 (H) 0.00 - 1.49  Protime-INR     Status: Abnormal   Collection Time: 05/13/15 10:51 AM  Result Value Ref Range   Prothrombin Time 17.0 (H) 11.6 - 15.2 seconds   INR 1.37 0.00 - 1.49  Protime-INR     Status: Abnormal   Collection Time: 05/16/15 11:32 AM  Result Value Ref Range   Prothrombin Time 22.4 (H) 11.6 - 15.2 seconds   INR 1.98 (H) 0.00 - 1.49  Lactate dehydrogenase     Status: Abnormal   Collection Time: 05/16/15 11:32 AM  Result Value Ref Range   LDH 257 (H) 98 - 192 U/L  Protime-INR     Status: Abnormal   Collection Time: 05/26/15  4:53 PM  Result Value Ref Range   Prothrombin Time 23.4 (H) 11.6 - 15.2 seconds   INR 2.09 (H) 0.00 - 1.49  Protime-INR     Status: Abnormal   Collection Time: 06/11/15 12:12 PM  Result Value Ref Range   Prothrombin Time 21.0 (H) 11.6 - 15.2 seconds   INR 1.82 (H) 0.00 - 1.49  Protime-INR     Status: Abnormal   Collection Time: 06/19/15 12:00 AM  Result Value Ref Range   INR 3.1 (A) .9 - 1.1  CBC     Status: Abnormal   Collection Time:  06/19/15 11:04 AM  Result Value Ref Range   WBC 6.4 4.0 - 10.5 K/uL   RBC 4.59 4.22 - 5.81 MIL/uL   Hemoglobin 13.1 13.0 - 17.0 g/dL   HCT 40.9 39.0 - 52.0 %   MCV 89.1 78.0 - 100.0 fL   MCH 28.5 26.0 - 34.0 pg   MCHC 32.0 30.0 - 36.0 g/dL   RDW 15.2 11.5 - 15.5 %   Platelets 146 (L) 150 - 400 K/uL  Protime-INR     Status: Abnormal   Collection Time: 06/19/15 11:04 AM  Result Value Ref Range   Prothrombin Time 31.0 (H) 11.6 - 15.2 seconds   INR 3.06 (H) 0.00 - 1.49  Lactate dehydrogenase     Status: Abnormal   Collection Time: 06/19/15 11:04 AM  Result Value Ref Range   LDH 237 (H) 98 - 192 U/L  Comprehensive Metabolic Panel (CMET)     Status: Abnormal   Collection Time: 06/19/15 11:04 AM  Result Value Ref Range   Sodium 142 135 - 145 mmol/L   Potassium 3.7 3.5 - 5.1 mmol/L   Chloride 106 101 - 111 mmol/L   CO2 24 22 - 32 mmol/L   Glucose, Bld 98 65 - 99 mg/dL   BUN 12 6 - 20 mg/dL   Creatinine, Ser 1.15 0.61 - 1.24 mg/dL   Calcium 9.2 8.9 - 10.3 mg/dL   Total Protein 6.6 6.5 - 8.1 g/dL   Albumin 3.8 3.5 - 5.0 g/dL   AST 17 15 - 41 U/L   ALT 14 (L) 17 - 63 U/L   Alkaline Phosphatase 88  38 - 126 U/L   Total Bilirubin 0.7 0.3 - 1.2 mg/dL   GFR calc non Af Amer >60 >60 mL/min   GFR calc Af Amer >60 >60 mL/min    Comment: (NOTE) The eGFR has been calculated using the CKD EPI equation. This calculation has not been validated in all clinical situations. eGFR's persistently <60 mL/min signify possible Chronic Kidney Disease.    Anion gap 12 5 - 15  B Nat Peptide     Status: None   Collection Time: 06/19/15 11:05 AM  Result Value Ref Range   B Natriuretic Peptide 66.3 0.0 - 100.0 pg/mL  Basic metabolic panel     Status: Abnormal   Collection Time: 06/19/15  4:45 PM  Result Value Ref Range   Sodium 140 135 - 145 mmol/L   Potassium 3.7 3.5 - 5.1 mmol/L   Chloride 107 101 - 111 mmol/L   CO2 21 (L) 22 - 32 mmol/L   Glucose, Bld 99 65 - 99 mg/dL   BUN 10 6 - 20 mg/dL    Creatinine, Ser 1.22 0.61 - 1.24 mg/dL   Calcium 9.2 8.9 - 10.3 mg/dL   GFR calc non Af Amer >60 >60 mL/min   GFR calc Af Amer >60 >60 mL/min    Comment: (NOTE) The eGFR has been calculated using the CKD EPI equation. This calculation has not been validated in all clinical situations. eGFR's persistently <60 mL/min signify possible Chronic Kidney Disease.    Anion gap 12 5 - 15  CBC     Status: Abnormal   Collection Time: 06/19/15  4:45 PM  Result Value Ref Range   WBC 6.8 4.0 - 10.5 K/uL   RBC 4.61 4.22 - 5.81 MIL/uL   Hemoglobin 13.1 13.0 - 17.0 g/dL   HCT 40.7 39.0 - 52.0 %   MCV 88.3 78.0 - 100.0 fL   MCH 28.4 26.0 - 34.0 pg   MCHC 32.2 30.0 - 36.0 g/dL   RDW 15.1 11.5 - 15.5 %   Platelets 142 (L) 150 - 400 K/uL  Protime-INR - (order if Patient is taking Coumadin / Warfarin)     Status: Abnormal   Collection Time: 06/19/15  4:45 PM  Result Value Ref Range   Prothrombin Time 28.7 (H) 11.6 - 15.2 seconds   INR 2.76 (H) 0.00 - 1.49  I-stat troponin, ED (not at [facility], [facility])     Status: None   Collection Time: 06/19/15  4:57 PM  Result Value Ref Range   Troponin i, poc 0.00 0.00 - 0.08 ng/mL   Comment 3            Comment: Due to the release kinetics of cTnI, a negative result within the first hours of the onset of symptoms does not rule out myocardial infarction with certainty. If myocardial infarction is still suspected, repeat the test at appropriate intervals.   I-stat troponin, ED (0, 3, 6) - do not order at [facility]     Status: None   Collection Time: 06/19/15  6:28 PM  Result Value Ref Range   Troponin i, poc 0.01 0.00 - 0.08 ng/mL   Comment 3            Comment: Due to the release kinetics of cTnI, a negative result within the first hours of the onset of symptoms does not rule out myocardial infarction with certainty. If myocardial infarction is still suspected, repeat the test at appropriate intervals.     ASSESSMENT AND PLAN:  1)  HTN-  - MAP was improved  with Entresto 97/03 bid. But didn't take today. Also skipped hydralazine. BP up today. Discussed need for compliance. 2) Chronic systolic HF, s/p HMII LVAD implant 05/2012.  - He is improved with VAD speed down to 9600.  - Now dual listed at [facility]. RHC looks great.  - He is not on a BB. He was able to tolerate low-doseToprol before LVAD, however has been intolerant since that time  - Reinforced the need and importance of daily weights, a low sodium diet, and fluid restriction (less than 2 L a day). Instructed to call the HF clinic if weight increases more than 3 lbs overnight or 5 lbs in a week.  3) Palpitations - Improved.ICD now at EOL. We have decided to not replace given that risk of infection felt to be higher than benefit or replacing as VT usually well tolerated in setting of VAD support 4) Hyperthyroidism - Amio induced. Follows with Dr. Forde Dandy. Amio stopped. Now on tapazole and prednisone.  5) NSVT/VT  - Off amio. On mexilitene. 6) PVCs - Much improved with treatment of hyperthyroidism.  7) Anticoagulation with coumadin  - Continue coumadin and ASA 325 mg for LVAD. 8) LVAD  - Improved on 9600. Continue transplant process. Driveline site looks good.  9) Left shoulder pain and limited ROM -May be gout. Will refer to sports medicine and PT for full eval. If worse can give steroid taper.   Glori Bickers MD  10:25 PM

## 2015-07-04 ENCOUNTER — Other Ambulatory Visit (HOSPITAL_COMMUNITY): Payer: Self-pay | Admitting: Internal Medicine

## 2015-07-07 ENCOUNTER — Ambulatory Visit (HOSPITAL_COMMUNITY)
Admission: RE | Admit: 2015-07-07 | Discharge: 2015-07-07 | Disposition: A | Discharge status: Home / Self Care | Payer: Medicaid Other | Source: Ambulatory Visit | Attending: Internal Medicine | Admitting: Internal Medicine

## 2015-07-07 ENCOUNTER — Other Ambulatory Visit (HOSPITAL_COMMUNITY): Payer: Self-pay | Admitting: Unknown Physician Specialty

## 2015-07-07 ENCOUNTER — Ambulatory Visit (HOSPITAL_COMMUNITY): Payer: Self-pay | Admitting: Unknown Physician Specialty

## 2015-07-07 VITALS — BP 102/0

## 2015-07-07 DIAGNOSIS — I10 Essential (primary) hypertension: Secondary | ICD-10-CM

## 2015-07-07 DIAGNOSIS — Z7901 Long term (current) use of anticoagulants: Secondary | ICD-10-CM | POA: Insufficient documentation

## 2015-07-07 DIAGNOSIS — Z95811 Presence of heart assist device: Secondary | ICD-10-CM | POA: Diagnosis not present

## 2015-07-07 DIAGNOSIS — M1A0190 Idiopathic chronic gout, unspecified shoulder, without tophus (tophi): Secondary | ICD-10-CM

## 2015-07-07 DIAGNOSIS — I5023 Acute on chronic systolic (congestive) heart failure: Secondary | ICD-10-CM

## 2015-07-07 DIAGNOSIS — E079 Disorder of thyroid, unspecified: Secondary | ICD-10-CM | POA: Insufficient documentation

## 2015-07-07 DIAGNOSIS — I428 Other cardiomyopathies: Secondary | ICD-10-CM

## 2015-07-07 DIAGNOSIS — I472 Ventricular tachycardia, unspecified: Secondary | ICD-10-CM

## 2015-07-07 LAB — TSH: TSH: 4.413 u[IU]/mL (ref 0.350–4.500)

## 2015-07-07 LAB — T4, FREE: FREE T4: 1.01 ng/dL (ref 0.61–1.12)

## 2015-07-07 LAB — PROTIME-INR
INR: 1.85 — AB (ref 0.00–1.49)
Prothrombin Time: 21.3 seconds — ABNORMAL HIGH (ref 11.6–15.2)

## 2015-07-07 MED ORDER — ALLOPURINOL 300 MG PO TABS: 300.0000 mg | ORAL_TABLET | Freq: Every day | ORAL | Status: DC

## 2015-07-07 MED ORDER — HYDRALAZINE HCL 100 MG PO TABS: 50.0000 mg | ORAL_TABLET | Freq: Two times a day (BID) | ORAL | Status: DC

## 2015-07-07 NOTE — Addendum Note (Signed)
Encounter addended by: Levonne Spiller, RN on: 07/07/2015 12:01 PM<BR>     Documentation filed: Vitals Section, Medications, Visit Diagnoses, Dx Association, Notes Section, Orders

## 2015-07-07 NOTE — Progress Notes (Addendum)
Pt here for labs and BP check.  Doppler BP:  102 Auto cuff BP:  122/69 (89)  Pt confirms he is taking Norvasc 10 mg daily, Cardura 8 mg daily, Entresto 97 - 103 bid, and only taking Hydralazine 100 mg daily.  Pt says if he takes the hydralazine twice daily his "PI drops" and he feels fatigued.  Pt also c/o overall muscle pain, especially in his legs.  Dr. Gala Romney updated and pt instructed to stop his lipitor and take hydralazine 50 mg twice daily. Pt verbalized understanding of same.

## 2015-07-08 LAB — T3, FREE: T3 FREE: 2.9 pg/mL (ref 2.0–4.4)

## 2015-07-10 ENCOUNTER — Telehealth (HOSPITAL_COMMUNITY): Payer: Self-pay | Admitting: *Deleted

## 2015-07-10 ENCOUNTER — Other Ambulatory Visit (HOSPITAL_COMMUNITY): Payer: Self-pay | Admitting: *Deleted

## 2015-07-10 NOTE — Telephone Encounter (Signed)
Called pt and left message re: scheduled appointment with:    [facility]  [address]  [phone]  Appt:  07/17/15 at 3:00 pm (pt should arrive at 2:45pm)  Instructed pt to call and reschedule if appointment time/date unacceptable.

## 2015-07-14 ENCOUNTER — Other Ambulatory Visit (HOSPITAL_COMMUNITY): Payer: Self-pay | Admitting: Unknown Physician Specialty

## 2015-07-14 ENCOUNTER — Ambulatory Visit (HOSPITAL_COMMUNITY)
Admission: RE | Admit: 2015-07-14 | Discharge: 2015-07-14 | Disposition: A | Discharge status: Home / Self Care | Payer: Medicaid Other | Source: Ambulatory Visit | Attending: Internal Medicine | Admitting: Internal Medicine

## 2015-07-14 ENCOUNTER — Ambulatory Visit (HOSPITAL_COMMUNITY): Payer: Self-pay | Admitting: Unknown Physician Specialty

## 2015-07-14 DIAGNOSIS — Z5181 Encounter for therapeutic drug level monitoring: Secondary | ICD-10-CM | POA: Insufficient documentation

## 2015-07-14 DIAGNOSIS — Z7901 Long term (current) use of anticoagulants: Secondary | ICD-10-CM | POA: Diagnosis not present

## 2015-07-14 DIAGNOSIS — Z95811 Presence of heart assist device: Secondary | ICD-10-CM | POA: Insufficient documentation

## 2015-07-14 LAB — PROTIME-INR
INR: 2.47 — AB (ref 0.00–1.49)
PROTHROMBIN TIME: 26.4 s — AB (ref 11.6–15.2)

## 2015-07-17 ENCOUNTER — Ambulatory Visit: Payer: Medicaid Other | Attending: Internal Medicine

## 2015-07-23 ENCOUNTER — Other Ambulatory Visit (HOSPITAL_COMMUNITY): Payer: Self-pay | Admitting: *Deleted

## 2015-07-23 ENCOUNTER — Telehealth (HOSPITAL_COMMUNITY): Payer: Self-pay | Admitting: *Deleted

## 2015-07-23 DIAGNOSIS — Z95811 Presence of heart assist device: Secondary | ICD-10-CM

## 2015-07-23 DIAGNOSIS — I10 Essential (primary) hypertension: Secondary | ICD-10-CM

## 2015-07-23 DIAGNOSIS — Z01818 Encounter for other preprocedural examination: Secondary | ICD-10-CM

## 2015-07-23 DIAGNOSIS — I472 Ventricular tachycardia, unspecified: Secondary | ICD-10-CM

## 2015-07-23 DIAGNOSIS — Z7901 Long term (current) use of anticoagulants: Secondary | ICD-10-CM

## 2015-07-23 DIAGNOSIS — M109 Gout, unspecified: Secondary | ICD-10-CM

## 2015-07-23 DIAGNOSIS — E0590 Thyrotoxicosis, unspecified without thyrotoxic crisis or storm: Secondary | ICD-10-CM

## 2015-07-23 DIAGNOSIS — I428 Other cardiomyopathies: Secondary | ICD-10-CM

## 2015-07-23 DIAGNOSIS — R002 Palpitations: Secondary | ICD-10-CM

## 2015-07-23 NOTE — Telephone Encounter (Signed)
Called pt per Dr. Gala Romney.  [facility] needs VQ scan along with additional labs to update his annual listing status on their heart transplant waitlist. Pt asked that be done at [facility] and [facility] is agreeable to same.  Packet faxed and emailed to [facility] with all requested testing/labs. The following are needed to complete requested labs/tests:  VQ scan   Mag  Phos  Amylase  Lipase  Iron  Ferretin  TIBC  Uric acid  Lipid  PSA  Rubella  Pt instructed to report to Admissions Monday, 07/28/15 at 12:00 noon for CXR/VQ scan.  He will come by VAD clinic for labs. Pt verbalized understanding of same.

## 2015-07-28 ENCOUNTER — Ambulatory Visit (HOSPITAL_BASED_OUTPATIENT_CLINIC_OR_DEPARTMENT_OTHER)
Admission: RE | Admit: 2015-07-28 | Discharge: 2015-07-28 | Disposition: A | Discharge status: Home / Self Care | Payer: Medicaid Other | Source: Ambulatory Visit | Attending: Cardiology | Admitting: Cardiology

## 2015-07-28 ENCOUNTER — Ambulatory Visit (HOSPITAL_COMMUNITY)
Admission: RE | Admit: 2015-07-28 | Discharge: 2015-07-28 | Disposition: A | Discharge status: Home / Self Care | Payer: Medicaid Other | Source: Ambulatory Visit | Attending: Internal Medicine | Admitting: Internal Medicine

## 2015-07-28 ENCOUNTER — Ambulatory Visit (HOSPITAL_COMMUNITY): Payer: Self-pay | Admitting: Unknown Physician Specialty

## 2015-07-28 ENCOUNTER — Other Ambulatory Visit (HOSPITAL_COMMUNITY): Payer: Self-pay | Admitting: Unknown Physician Specialty

## 2015-07-28 ENCOUNTER — Other Ambulatory Visit (HOSPITAL_COMMUNITY): Payer: Medicaid Other

## 2015-07-28 DIAGNOSIS — M109 Gout, unspecified: Secondary | ICD-10-CM | POA: Insufficient documentation

## 2015-07-28 DIAGNOSIS — Z7901 Long term (current) use of anticoagulants: Secondary | ICD-10-CM

## 2015-07-28 DIAGNOSIS — I428 Other cardiomyopathies: Secondary | ICD-10-CM

## 2015-07-28 DIAGNOSIS — R002 Palpitations: Secondary | ICD-10-CM

## 2015-07-28 DIAGNOSIS — I472 Ventricular tachycardia, unspecified: Secondary | ICD-10-CM

## 2015-07-28 DIAGNOSIS — E0590 Thyrotoxicosis, unspecified without thyrotoxic crisis or storm: Secondary | ICD-10-CM

## 2015-07-28 DIAGNOSIS — I119 Hypertensive heart disease without heart failure: Secondary | ICD-10-CM | POA: Diagnosis not present

## 2015-07-28 DIAGNOSIS — Z95811 Presence of heart assist device: Secondary | ICD-10-CM

## 2015-07-28 DIAGNOSIS — Z01818 Encounter for other preprocedural examination: Secondary | ICD-10-CM

## 2015-07-28 DIAGNOSIS — Z7682 Awaiting organ transplant status: Secondary | ICD-10-CM | POA: Diagnosis not present

## 2015-07-28 DIAGNOSIS — I10 Essential (primary) hypertension: Secondary | ICD-10-CM

## 2015-07-28 DIAGNOSIS — I5023 Acute on chronic systolic (congestive) heart failure: Secondary | ICD-10-CM

## 2015-07-28 LAB — LIPID PANEL
CHOL/HDL RATIO: 4.4 ratio
CHOLESTEROL: 263 mg/dL — AB (ref 0–200)
HDL: 60 mg/dL (ref >40)
LDL Cholesterol: 176 mg/dL — ABNORMAL HIGH (ref 0–99)
TRIGLYCERIDES: 134 mg/dL (ref <150)
VLDL: 27 mg/dL (ref 0–40)

## 2015-07-28 LAB — AMYLASE: AMYLASE: 52 U/L (ref 28–100)

## 2015-07-28 LAB — IRON AND TIBC
Iron: 52 ug/dL (ref 45–182)
Saturation Ratios: 15 % — ABNORMAL LOW (ref 17.9–39.5)
TIBC: 336 ug/dL (ref 250–450)
UIBC: 284 ug/dL

## 2015-07-28 LAB — LIPASE, BLOOD: Lipase: 24 U/L (ref 11–51)

## 2015-07-28 LAB — MAGNESIUM: MAGNESIUM: 2.1 mg/dL (ref 1.7–2.4)

## 2015-07-28 LAB — PSA: PSA: 0.75 ng/mL (ref 0.00–4.00)

## 2015-07-28 LAB — PROTIME-INR
INR: 1.76 — AB (ref 0.00–1.49)
PROTHROMBIN TIME: 20.5 s — AB (ref 11.6–15.2)

## 2015-07-28 LAB — PHOSPHORUS: PHOSPHORUS: 3.1 mg/dL (ref 2.5–4.6)

## 2015-07-28 LAB — FERRITIN: Ferritin: 56 ng/mL (ref 24–336)

## 2015-07-28 LAB — URIC ACID: URIC ACID, SERUM: 4.5 mg/dL (ref 4.4–7.6)

## 2015-07-28 MED ORDER — TECHNETIUM TC 99M DIETHYLENETRIAME-PENTAACETIC ACID: 31.4000 | Freq: Once | INTRAVENOUS | Status: DC | PRN

## 2015-07-28 MED ORDER — TECHNETIUM TO 99M ALBUMIN AGGREGATED
4.2000 | Freq: Once | INTRAVENOUS | Status: AC | PRN
  Administered 2015-07-28: 4 via INTRAVENOUS

## 2015-07-29 ENCOUNTER — Other Ambulatory Visit (HOSPITAL_COMMUNITY): Payer: Self-pay | Admitting: *Deleted

## 2015-07-29 DIAGNOSIS — I5040 Unspecified combined systolic (congestive) and diastolic (congestive) heart failure: Secondary | ICD-10-CM

## 2015-07-29 LAB — RUBELLA SCREEN: RUBELLA: 24.2 {index} (ref >0.99)

## 2015-08-04 ENCOUNTER — Ambulatory Visit (HOSPITAL_COMMUNITY): Payer: Self-pay | Admitting: Unknown Physician Specialty

## 2015-08-04 ENCOUNTER — Ambulatory Visit (HOSPITAL_COMMUNITY)
Admission: RE | Admit: 2015-08-04 | Discharge: 2015-08-04 | Disposition: A | Discharge status: Home / Self Care | Payer: Medicaid Other | Source: Ambulatory Visit | Attending: Internal Medicine | Admitting: Internal Medicine

## 2015-08-04 DIAGNOSIS — Z7901 Long term (current) use of anticoagulants: Secondary | ICD-10-CM | POA: Insufficient documentation

## 2015-08-04 DIAGNOSIS — I5023 Acute on chronic systolic (congestive) heart failure: Secondary | ICD-10-CM

## 2015-08-04 DIAGNOSIS — Z5181 Encounter for therapeutic drug level monitoring: Secondary | ICD-10-CM | POA: Insufficient documentation

## 2015-08-04 DIAGNOSIS — Z95811 Presence of heart assist device: Secondary | ICD-10-CM

## 2015-08-04 LAB — PROTIME-INR
INR: 2.25 — AB (ref 0.00–1.49)
PROTHROMBIN TIME: 24.6 s — AB (ref 11.6–15.2)

## 2015-08-07 ENCOUNTER — Other Ambulatory Visit (HOSPITAL_COMMUNITY): Payer: Self-pay | Admitting: Internal Medicine

## 2015-08-20 ENCOUNTER — Other Ambulatory Visit (HOSPITAL_COMMUNITY): Payer: Self-pay | Admitting: Unknown Physician Specialty

## 2015-08-20 ENCOUNTER — Encounter (HOSPITAL_COMMUNITY): Payer: Medicaid Other

## 2015-08-20 DIAGNOSIS — Z95811 Presence of heart assist device: Secondary | ICD-10-CM

## 2015-08-20 DIAGNOSIS — Z7901 Long term (current) use of anticoagulants: Secondary | ICD-10-CM

## 2015-08-20 DIAGNOSIS — E039 Hypothyroidism, unspecified: Secondary | ICD-10-CM

## 2015-08-22 ENCOUNTER — Telehealth (HOSPITAL_COMMUNITY): Payer: Self-pay | Admitting: *Deleted

## 2015-08-22 NOTE — Telephone Encounter (Signed)
Called pt re: missed appointment yesterday. Pt had appointment at [facility] transplant office. Re-scheduled for next week.

## 2015-08-26 ENCOUNTER — Ambulatory Visit (HOSPITAL_COMMUNITY)
Admission: RE | Admit: 2015-08-26 | Discharge: 2015-08-26 | Disposition: A | Discharge status: Home / Self Care | Payer: Medicaid Other | Source: Ambulatory Visit | Attending: Cardiology | Admitting: Cardiology

## 2015-08-26 ENCOUNTER — Ambulatory Visit (HOSPITAL_COMMUNITY): Payer: Self-pay | Admitting: Unknown Physician Specialty

## 2015-08-26 ENCOUNTER — Other Ambulatory Visit (HOSPITAL_COMMUNITY): Payer: Self-pay | Admitting: Unknown Physician Specialty

## 2015-08-26 VITALS — BP 92/0 | HR 75 | Ht 74.0 in | Wt 252.8 lb

## 2015-08-26 DIAGNOSIS — I428 Other cardiomyopathies: Secondary | ICD-10-CM

## 2015-08-26 DIAGNOSIS — I110 Hypertensive heart disease with heart failure: Secondary | ICD-10-CM | POA: Diagnosis not present

## 2015-08-26 DIAGNOSIS — Z95811 Presence of heart assist device: Secondary | ICD-10-CM | POA: Insufficient documentation

## 2015-08-26 DIAGNOSIS — Z79899 Other long term (current) drug therapy: Secondary | ICD-10-CM | POA: Insufficient documentation

## 2015-08-26 DIAGNOSIS — I5022 Chronic systolic (congestive) heart failure: Secondary | ICD-10-CM | POA: Diagnosis not present

## 2015-08-26 DIAGNOSIS — Z8679 Personal history of other diseases of the circulatory system: Secondary | ICD-10-CM

## 2015-08-26 DIAGNOSIS — I272 Other secondary pulmonary hypertension: Secondary | ICD-10-CM | POA: Insufficient documentation

## 2015-08-26 DIAGNOSIS — E039 Hypothyroidism, unspecified: Secondary | ICD-10-CM | POA: Diagnosis not present

## 2015-08-26 DIAGNOSIS — Z7682 Awaiting organ transplant status: Secondary | ICD-10-CM | POA: Diagnosis not present

## 2015-08-26 DIAGNOSIS — Z7901 Long term (current) use of anticoagulants: Secondary | ICD-10-CM | POA: Diagnosis not present

## 2015-08-26 DIAGNOSIS — T827XXA Infection and inflammatory reaction due to other cardiac and vascular devices, implants and grafts, initial encounter: Secondary | ICD-10-CM

## 2015-08-26 DIAGNOSIS — Z7982 Long term (current) use of aspirin: Secondary | ICD-10-CM | POA: Diagnosis not present

## 2015-08-26 DIAGNOSIS — I493 Ventricular premature depolarization: Secondary | ICD-10-CM | POA: Diagnosis not present

## 2015-08-26 DIAGNOSIS — I429 Cardiomyopathy, unspecified: Secondary | ICD-10-CM

## 2015-08-26 DIAGNOSIS — E0590 Thyrotoxicosis, unspecified without thyrotoxic crisis or storm: Secondary | ICD-10-CM | POA: Insufficient documentation

## 2015-08-26 DIAGNOSIS — Z7952 Long term (current) use of systemic steroids: Secondary | ICD-10-CM | POA: Insufficient documentation

## 2015-08-26 DIAGNOSIS — T829XXA Unspecified complication of cardiac and vascular prosthetic device, implant and graft, initial encounter: Secondary | ICD-10-CM | POA: Insufficient documentation

## 2015-08-26 LAB — BASIC METABOLIC PANEL
ANION GAP: 3 — AB (ref 5–15)
BUN: 9 mg/dL (ref 6–20)
CALCIUM: 8.6 mg/dL — AB (ref 8.9–10.3)
CO2: 26 mmol/L (ref 22–32)
Chloride: 109 mmol/L (ref 101–111)
Creatinine, Ser: 0.99 mg/dL (ref 0.61–1.24)
GLUCOSE: 93 mg/dL (ref 65–99)
POTASSIUM: 3.9 mmol/L (ref 3.5–5.1)
Sodium: 138 mmol/L (ref 135–145)

## 2015-08-26 LAB — PROTIME-INR
INR: 2.12 — AB (ref 0.00–1.49)
PROTHROMBIN TIME: 23.6 s — AB (ref 11.6–15.2)

## 2015-08-26 LAB — CBC
HEMATOCRIT: 40.6 % (ref 39.0–52.0)
HEMOGLOBIN: 13.2 g/dL (ref 13.0–17.0)
MCH: 28.2 pg (ref 26.0–34.0)
MCHC: 32.5 g/dL (ref 30.0–36.0)
MCV: 86.8 fL (ref 78.0–100.0)
Platelets: 150 10*3/uL (ref 150–400)
RBC: 4.68 MIL/uL (ref 4.22–5.81)
RDW: 13.6 % (ref 11.5–15.5)
WBC: 6.4 10*3/uL (ref 4.0–10.5)

## 2015-08-26 LAB — TSH: TSH: 2.303 u[IU]/mL (ref 0.350–4.500)

## 2015-08-26 LAB — LACTATE DEHYDROGENASE: LDH: 255 U/L — ABNORMAL HIGH (ref 98–192)

## 2015-08-26 MED ORDER — DOXYCYCLINE HYCLATE 50 MG PO CAPS: 100.0000 mg | ORAL_CAPSULE | Freq: Two times a day (BID) | ORAL | Status: DC

## 2015-08-26 NOTE — Patient Instructions (Signed)
1. Take 100 mg of Doxycyline twice a day for 7 days.  2. Will call you with lab results and INR.  3. Return to clinic in 2 months.

## 2015-08-26 NOTE — Progress Notes (Signed)
LVAD CLINIC NOTE  Patient ID: Robert [name], male   DOB: [birth year], 60 y.o.   MRN: [id number] PCP: N/A HF: [place]  HPI: 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 status post dual chamber Medtronic ICD implanted April 2011. Cath 2011 showed normal coronaries. He also has a history of VTach . Currently listed as 1b list for heart tx at [facility].   Had [place] on 11/04/14 as part of dual listing process at [facility]. Normal filling pressures and cardiac output. Amio recently stopped due to thyrotoxicity. Placed on prednisone and tapazole. Had VT off amio and mexilitene started.  He is here for routine f/u.  On May 19th say he was showering and dropped controller. He has had tenderness around drive line with small amount of purulence noted. Says the drainage has slowed down. Overall feels ok. Denies SOB/PND/Orthopnea. No fever or chills. Dual listed at [facility] and [facility]. Taking all medications.     Reports taking Coumadin as prescribed and adherence to anticoagulation based dietary restrictions.  Denies bright red blood per rectum or melena, no dark urine or hematuria.     LVAD interrogation reveals:  Speed: 9400 Flow: 7.6 Power: 6.6 PI: 6.1 Alarms: none  Events: 2-4  Fixed speed: 9400 Low speed limit: 8800  I reviewed the LVAD parameters from today, and compared the results to the patient's prior recorded data.  No programming changes were made.  The LVAD is functioning within specified parameters.  The patient performs LVAD self-test daily.  LVAD interrogation was negative for any significant power changes, alarms or PI events/speed drops.  LVAD equipment check completed and is in good working order.  Back-up equipment present.   LVAD education done on emergency procedures and precautions and reviewed exit site care.    Past Medical History  Diagnosis Date  . AICD (automatic  cardioverter/defibrillator) present 2011  . Hypertension 2000  . Congestive heart failure ([place]) 2011  . Pneumonia   . Bronchitis   . Pulmonary hypertension ([place])     Current Outpatient Prescriptions  Medication Sig Dispense Refill  . allopurinol (ZYLOPRIM) 300 MG tablet Take 1 tablet (300 mg total) by mouth daily. 30 tablet 6  . amLODipine (NORVASC) 10 MG tablet Take 1 tablet (10 mg total) by mouth daily. 90 tablet 3  . aspirin EC 325 MG tablet Take 1 tablet (325 mg total) by mouth daily. 30 tablet 6  . doxazosin (CARDURA) 4 MG tablet TAKE ONE (1) TABLET EACH DAY 90 tablet 3  . doxazosin (CARDURA) 8 MG tablet Take 1 tablet (8 mg total) by mouth daily. 30 tablet 3  . hydrALAZINE (APRESOLINE) 100 MG tablet Take 0.5 tablets (50 mg total) by mouth 2 (two) times daily. 30 tablet 6  . HYDROcodone-acetaminophen (NORCO/VICODIN) 5-325 MG tablet Take 1 tablet by mouth every 4 (four) hours as needed. 16 tablet 0  . methimazole (TAPAZOLE) 10 MG tablet Take 2 tablets (20 mg total) by mouth 2 (two) times daily. 60 tablet 1  . mexiletine (MEXITIL) 150 MG capsule Take 1 capsule (150 mg total) by mouth 2 (two) times daily. 60 capsule 11  . pantoprazole (PROTONIX) 40 MG tablet TAKE 1 TABLET BY MOUTH EVERY DAY 90 tablet 3  . predniSONE (DELTASONE) 20 MG tablet Take 2 tablets (40 mg total) by mouth daily. 6 tablet 0  . sacubitril-valsartan (ENTRESTO) 97-103 MG  Take 1 tablet by mouth 2 (two) times daily. 60 tablet 6  . traMADol (ULTRAM) 50 MG tablet Take 1 tablet (50 mg total) by mouth every 8 (eight) hours as needed for moderate pain. 30 tablet 0  . warfarin (COUMADIN) 5 MG tablet Take 5 mg by mouth daily. Take as directed for INR goal 2.0 - 3.0     No current facility-administered medications for this encounter.    Imdur and Metoprolol  REVIEW OF SYSTEMS: All systems negative except as listed in HPI, PMH and Problem list.  Vital signs:  Vital signs: HR: 75 MAP BP: 88 O2 Sat: 97% Wt: 252 lbs Last  wt: 254 lbs Ht: 6'2"   Physical Exam: GENERAL: Fatigued appearing, male who presents to clinic today in no acute distress. HEENT: normal  NECK: Supple, JVP flat;  2+ bilaterally, no bruits.  No lymphadenopathy or thyromegaly appreciated.   CARDIAC:  Mechanical heart sounds with LVAD hum present.  LUNGS:  Clear to auscultation bilaterally.  ABDOMEN:  Soft, round, nontender, positive bowel sounds x4.     LVAD exit site: Mild erythema noted around exit site. With tenderness noted. No exudate.  Dressing dry and intact.  Stabilization device present and accurately applied.  Driveline itself with multiple tears (covered with tape) and twists EXTREMITIES:  Warm and dry, no cyanosis, clubbing, rash or edema  L shoulder sore limited ROM NEUROLOGIC:  Alert and oriented x 4.  Gait steady.  No aphasia.  No dysarthria.  Affect pleasant.     Recent Results (from the past 2160 hour(s))  Protime-INR     Status: Abnormal   Collection Time: 06/11/15 12:12 PM  Result Value Ref Range   Prothrombin Time 21.0 (H) 11.6 - 15.2 seconds   INR 1.82 (H) 0.00 - 1.49  Protime-INR     Status: Abnormal   Collection Time: 06/19/15 12:00 AM  Result Value Ref Range   INR 3.1 (A) .9 - 1.1  CBC     Status: Abnormal   Collection Time: 06/19/15 11:04 AM  Result Value Ref Range   WBC 6.4 4.0 - 10.5 K/uL   RBC 4.59 4.22 - 5.81 MIL/uL   Hemoglobin 13.1 13.0 - 17.0 g/dL   HCT 40.9 39.0 - 52.0 %   MCV 89.1 78.0 - 100.0 fL   MCH 28.5 26.0 - 34.0 pg   MCHC 32.0 30.0 - 36.0 g/dL   RDW 15.2 11.5 - 15.5 %   Platelets 146 (L) 150 - 400 K/uL  Protime-INR     Status: Abnormal   Collection Time: 06/19/15 11:04 AM  Result Value Ref Range   Prothrombin Time 31.0 (H) 11.6 - 15.2 seconds   INR 3.06 (H) 0.00 - 1.49  Lactate dehydrogenase     Status: Abnormal   Collection Time: 06/19/15 11:04 AM  Result Value Ref Range   LDH 237 (H) 98 - 192 U/L  Comprehensive Metabolic Panel (CMET)     Status: Abnormal   Collection Time:  06/19/15 11:04 AM  Result Value Ref Range   Sodium 142 135 - 145 mmol/L   Potassium 3.7 3.5 - 5.1 mmol/L   Chloride 106 101 - 111 mmol/L   CO2 24 22 - 32 mmol/L   Glucose, Bld 98 65 - 99 mg/dL   BUN 12 6 - 20 mg/dL   Creatinine, Ser 1.15 0.61 - 1.24 mg/dL   Calcium 9.2 8.9 - 10.3 mg/dL   Total Protein 6.6 6.5 - 8.1 g/dL   Albumin 3.8 3.5 -  5.0 g/dL   AST 17 15 - 41 U/L   ALT 14 (L) 17 - 63 U/L   Alkaline Phosphatase 88 38 - 126 U/L   Total Bilirubin 0.7 0.3 - 1.2 mg/dL   GFR calc non Af Amer >60 >60 mL/min   GFR calc Af Amer >60 >60 mL/min    Comment: (NOTE) The eGFR has been calculated using the CKD EPI equation. This calculation has not been validated in all clinical situations. eGFR's persistently <60 mL/min signify possible Chronic Kidney Disease.    Anion gap 12 5 - 15  B Nat Peptide     Status: None   Collection Time: 06/19/15 11:05 AM  Result Value Ref Range   B Natriuretic Peptide 66.3 0.0 - 100.0 pg/mL  Basic metabolic panel     Status: Abnormal   Collection Time: 06/19/15  4:45 PM  Result Value Ref Range   Sodium 140 135 - 145 mmol/L   Potassium 3.7 3.5 - 5.1 mmol/L   Chloride 107 101 - 111 mmol/L   CO2 21 (L) 22 - 32 mmol/L   Glucose, Bld 99 65 - 99 mg/dL   BUN 10 6 - 20 mg/dL   Creatinine, Ser 1.22 0.61 - 1.24 mg/dL   Calcium 9.2 8.9 - 10.3 mg/dL   GFR calc non Af Amer >60 >60 mL/min   GFR calc Af Amer >60 >60 mL/min    Comment: (NOTE) The eGFR has been calculated using the CKD EPI equation. This calculation has not been validated in all clinical situations. eGFR's persistently <60 mL/min signify possible Chronic Kidney Disease.    Anion gap 12 5 - 15  CBC     Status: Abnormal   Collection Time: 06/19/15  4:45 PM  Result Value Ref Range   WBC 6.8 4.0 - 10.5 K/uL   RBC 4.61 4.22 - 5.81 MIL/uL   Hemoglobin 13.1 13.0 - 17.0 g/dL   HCT 40.7 39.0 - 52.0 %   MCV 88.3 78.0 - 100.0 fL   MCH 28.4 26.0 - 34.0 pg   MCHC 32.2 30.0 - 36.0 g/dL   RDW 15.1 11.5  - 15.5 %   Platelets 142 (L) 150 - 400 K/uL  Protime-INR - (order if Patient is taking Coumadin / Warfarin)     Status: Abnormal   Collection Time: 06/19/15  4:45 PM  Result Value Ref Range   Prothrombin Time 28.7 (H) 11.6 - 15.2 seconds   INR 2.76 (H) 0.00 - 1.49  I-stat troponin, ED (not at [facility], [facility])     Status: None   Collection Time: 06/19/15  4:57 PM  Result Value Ref Range   Troponin i, poc 0.00 0.00 - 0.08 ng/mL   Comment 3            Comment: Due to the release kinetics of cTnI, a negative result within the first hours of the onset of symptoms does not rule out myocardial infarction with certainty. If myocardial infarction is still suspected, repeat the test at appropriate intervals.   I-stat troponin, ED (0, 3, 6) - do not order at [facility]     Status: None   Collection Time: 06/19/15  6:28 PM  Result Value Ref Range   Troponin i, poc 0.01 0.00 - 0.08 ng/mL   Comment 3            Comment: Due to the release kinetics of cTnI, a negative result within the first hours of the onset of symptoms does not rule out  myocardial infarction with certainty. If myocardial infarction is still suspected, repeat the test at appropriate intervals.   Protime-INR     Status: Abnormal   Collection Time: 07/07/15  9:55 AM  Result Value Ref Range   Prothrombin Time 21.3 (H) 11.6 - 15.2 seconds   INR 1.85 (H) 0.00 - 1.49  TSH     Status: None   Collection Time: 07/07/15  9:55 AM  Result Value Ref Range   TSH 4.413 0.350 - 4.500 uIU/mL  T4, free     Status: None   Collection Time: 07/07/15  9:55 AM  Result Value Ref Range   Free T4 1.01 0.61 - 1.12 ng/dL  T3, free     Status: None   Collection Time: 07/07/15  9:56 AM  Result Value Ref Range   T3, Free 2.9 2.0 - 4.4 pg/mL    Comment: (NOTE) Performed At: [facility] [place], [place] [id number] Lindon Romp MD [id number]   Protime-INR     Status: Abnormal   Collection Time: 07/14/15 12:09 PM  Result  Value Ref Range   Prothrombin Time 26.4 (H) 11.6 - 15.2 seconds   INR 2.47 (H) 0.00 - 1.49  Lipase, blood     Status: None   Collection Time: 07/28/15 11:40 AM  Result Value Ref Range   Lipase 24 11 - 51 U/L  Protime-INR     Status: Abnormal   Collection Time: 07/28/15 12:19 PM  Result Value Ref Range   Prothrombin Time 20.5 (H) 11.6 - 15.2 seconds   INR 1.76 (H) 0.00 - 1.49  Magnesium     Status: None   Collection Time: 07/28/15  2:26 PM  Result Value Ref Range   Magnesium 2.1 1.7 - 2.4 mg/dL  Phosphorus     Status: None   Collection Time: 07/28/15  2:26 PM  Result Value Ref Range   Phosphorus 3.1 2.5 - 4.6 mg/dL  Amylase     Status: None   Collection Time: 07/28/15  2:26 PM  Result Value Ref Range   Amylase 52 28 - 100 U/L  Ferritin     Status: None   Collection Time: 07/28/15  2:26 PM  Result Value Ref Range   Ferritin 56 24 - 336 ng/mL  Iron Binding Cap (TIBC)     Status: Abnormal   Collection Time: 07/28/15  2:26 PM  Result Value Ref Range   Iron 52 45 - 182 ug/dL   TIBC 336 250 - 450 ug/dL   Saturation Ratios 15 (L) 17.9 - 39.5 %   UIBC 284 ug/dL  Uric acid     Status: None   Collection Time: 07/28/15  2:26 PM  Result Value Ref Range   Uric Acid, Serum 4.5 4.4 - 7.6 mg/dL  Lipid Profile     Status: Abnormal   Collection Time: 07/28/15  2:26 PM  Result Value Ref Range   Cholesterol 263 (H) 0 - 200 mg/dL   Triglycerides 134 <150 mg/dL   HDL 60 >40 mg/dL   Total CHOL/HDL Ratio 4.4 RATIO   VLDL 27 0 - 40 mg/dL   LDL Cholesterol 176 (H) 0 - 99 mg/dL    Comment:        Total Cholesterol/HDL:CHD Risk Coronary Heart Disease Risk Table                     Men   Women  1/2 Average Risk   3.4   3.3  Average Risk       5.0   4.4  2 X Average Risk   9.6   7.1  3 X Average Risk  23.4   11.0        Use the calculated Patient Ratio above and the CHD Risk Table to determine the patient's CHD Risk.        ATP III CLASSIFICATION (LDL):  <100     mg/dL   Optimal   100-129  mg/dL   Near or Above                    Optimal  130-159  mg/dL   Borderline  160-189  mg/dL   High  >190     mg/dL   Very High   PSA     Status: None   Collection Time: 07/28/15  2:26 PM  Result Value Ref Range   PSA 0.75 0.00 - 4.00 ng/mL    Comment: (NOTE) While PSA levels of <=4.0 ng/ml are reported as reference range, some men with levels below 4.0 ng/ml can have prostate cancer and many men with PSA above 4.0 ng/ml do not have prostate cancer.  Other tests such as free PSA, age specific reference ranges, PSA velocity and PSA doubling time may be helpful especially in men less than 41 years old.   Rubella screen     Status: None   Collection Time: 07/28/15  2:26 PM  Result Value Ref Range   Rubella 24.20 Immune >0.99 index    Comment: (NOTE)                                Non-immune       <0.90                                Equivocal  0.90 - 0.99                                Immune           >0.99 Performed At: [facility], [place] [id number] Lindon Romp MD VQ:[id number]   Protime-INR     Status: Abnormal   Collection Time: 08/04/15  3:10 PM  Result Value Ref Range   Prothrombin Time 24.6 (H) 11.6 - 15.2 seconds   INR 2.25 (H) 0.00 - 1.49    ASSESSMENT AND PLAN:   1)  HTN-  - MAP ok. Continue Entresto 97/103 bid.  2) Chronic systolic HF, s/p HMII LVAD implant 05/2012.  - NYHA II. Volume status stable. Continue entresto 97-103 twice a day.   - Now dual listed at [facility].  - He is not on a BB. He was able to tolerate low-doseToprol before LVAD, however has been intolerant since that time  - Reinforced the need and importance of daily weights, a low sodium diet, and fluid restriction (less than 2 L a day). Instructed to call the HF clinic if weight increases more than 3 lbs overnight or 5 lbs in a week.  3) Palpitations -resolved. ICD now at EOL. Will not replace. Due to  risk of infection felt to be higher than  benefit or replacing as VT usually well tolerated in setting of VAD support 4) Hyperthyroidism - Amio induced. Follows  with Dr. Forde Dandy. Remains on tapazole and prednisone.  5) NSVT/VT  - Off amio. On mexilitene. 6) PVCs - Much improved with treatment of hyperthyroidism.  7) Anticoagulation with coumadin  - Continue coumadin and ASA 325 mg for LVAD. 8) LVAD  - Stable VAD parameters. Driveline site mild erythema. Add doxycycline.   9) LVAD Suspected Drive Line Trauma- Had purulence noted on 5/19 after dropping controlled. Today I do not see any purulence. I did culture exit site. Place on doxycycline 100 mg twice a day for 7 days. He was instructed to call if he develops fever, increased tenderness, or exudate.    Labs today --Stable. INR therapeutic. CBC/BMET ok.  Follow up in 2 months.    Tessia Kassin NP-C  12:32 PM

## 2015-08-26 NOTE — Progress Notes (Signed)
Symptom  Yes  No  Details   Angina       x Activity:   Claudication       x How far:   Syncope       x When:  Stroke       x   Orthopnea       x How many pillows:  1 pillow  PND       x How often:  CPAP       N/A How many hrs:   Pedal edema       x   Abd fullness       x   N&V       x good appetite  Diaphoresis       x When:  Bleeding      x   Urine color    light yellow  SOB       x Activity:   Palpitations        x      When: less frequent; "beats hard" somestimes  ICD shock       x   Hospitlizaitons       x When/where/why:  ED visit       x When/where/why:  Other MD       x When/who/why:    Activity            decreased due to palpitations  Fluid    No limitations  Diet    No limitations   Vital signs: HR: 75 MAP BP: 89 Auto cuff: 110/79 (89) O2 Sat: 97% Wt: 252.8 lbs Last wt: 254  lbs Ht:  6'2"  LVAD interrogation reveals:  Speed:  9400 Flow: 7.6 Power: 6.6w PI: 6.5 Alarms:  none Events: 2 - 4 daily  Fixed speed:  9400 Low speed limit:  8800  11V battery status: Primary controller: replace 23 months              Secondary controller: replace 10 months   LVAD exit site:  Well healed and incorporated. The velour is fully implanted at exit site. Dressing dry and intact. No erythema or drainage. Stabilization device present and accurately applied. Driveline dressing is being changed q 3 - 5 days per sterile technique using Sorbaview dressing with biopatch on exit site. Pt does self dressing changes. Pt denies fever or chills.  Pt denies any alarms or VAD equipment issues. Pt is completing weekly and monthly maintenance for LVAD equipment. LVAD equipment check completed and is in good working order. Back-up equipment present. LVAD education done on emergency procedures and precautions and reviewed exit site care.  Encounter Details: Pt had an incident with driveline after getting out of the shower. Pt had shower bag on and sat on the bed, pt states that his shower bag  fell to the ground pulling his driveline. Pt states that he had a small amount of "pus like" drainage from his driveline site for a couple of days. See above note for driveline details.  INR 2.43 --> goal 2 - 3 with 325 ASA. See anticoag sheet.   LDH 255 --> stable within established baseline.  MAP 92 today.  Medtronic device has reached RRT 02/21/15--> has an appointment with EP doc today to discuss replacing. With transplant status and LVAD D/W Dr. Gala Romney and will not replace  RTC in 2 m and in 1 weeks for INR check due to Doxy prescription  Carlton Adam, RN VAD Coordinator   Office: [phone]  24/7 VAD Pager: [phone]

## 2015-08-27 ENCOUNTER — Telehealth (HOSPITAL_COMMUNITY): Payer: Self-pay | Admitting: Surgery

## 2015-08-27 ENCOUNTER — Telehealth (HOSPITAL_COMMUNITY): Payer: Self-pay | Admitting: *Deleted

## 2015-08-27 DIAGNOSIS — IMO0001 Reserved for inherently not codable concepts without codable children: Secondary | ICD-10-CM

## 2015-08-27 DIAGNOSIS — Z7901 Long term (current) use of anticoagulants: Secondary | ICD-10-CM

## 2015-08-27 DIAGNOSIS — T814XXA Infection following a procedure, initial encounter: Principal | ICD-10-CM

## 2015-08-27 DIAGNOSIS — Z79899 Other long term (current) drug therapy: Secondary | ICD-10-CM

## 2015-08-27 MED ORDER — CIPROFLOXACIN HCL 500 MG PO TABS: 500.0000 mg | ORAL_TABLET | Freq: Two times a day (BID) | ORAL | Status: DC

## 2015-08-27 NOTE — Telephone Encounter (Signed)
Called pt per Dr. Gala Romney, changing his antibiotic coverage based on preliminary wound culture form VAD drive line site results of gm negative rods. Instructed him to stop doxy and start Cipro 500 mg bid x 7 days. We will check INR next Wednesday based on interaction of Cipro and Warfarin. Pt verbalized understanding of same.

## 2015-08-27 NOTE — Telephone Encounter (Signed)
Lab called to deliver critical results of Gram Negative Rods Present in culture  taken on 08/26/15.  Hessie Diener VAD Coordinator and Tonye Becket NP made aware.

## 2015-08-28 LAB — AEROBIC CULTURE W GRAM STAIN (SUPERFICIAL SPECIMEN)

## 2015-08-28 LAB — AEROBIC CULTURE  (SUPERFICIAL SPECIMEN): GRAM STAIN: NONE SEEN

## 2015-09-03 ENCOUNTER — Ambulatory Visit (HOSPITAL_COMMUNITY)
Admission: RE | Admit: 2015-09-03 | Discharge: 2015-09-03 | Disposition: A | Discharge status: Home / Self Care | Payer: Medicaid Other | Source: Ambulatory Visit | Attending: Internal Medicine | Admitting: Internal Medicine

## 2015-09-03 ENCOUNTER — Other Ambulatory Visit (HOSPITAL_COMMUNITY): Payer: Self-pay | Admitting: Infectious Diseases

## 2015-09-03 ENCOUNTER — Ambulatory Visit (HOSPITAL_COMMUNITY): Payer: Self-pay | Admitting: Infectious Diseases

## 2015-09-03 DIAGNOSIS — Z7901 Long term (current) use of anticoagulants: Secondary | ICD-10-CM

## 2015-09-03 DIAGNOSIS — I5023 Acute on chronic systolic (congestive) heart failure: Secondary | ICD-10-CM

## 2015-09-03 DIAGNOSIS — Y838 Other surgical procedures as the cause of abnormal reaction of the patient, or of later complication, without mention of misadventure at the time of the procedure: Secondary | ICD-10-CM | POA: Diagnosis not present

## 2015-09-03 DIAGNOSIS — IMO0001 Reserved for inherently not codable concepts without codable children: Secondary | ICD-10-CM

## 2015-09-03 DIAGNOSIS — T814XXA Infection following a procedure, initial encounter: Secondary | ICD-10-CM | POA: Insufficient documentation

## 2015-09-03 DIAGNOSIS — Z95811 Presence of heart assist device: Secondary | ICD-10-CM

## 2015-09-03 DIAGNOSIS — Z79899 Other long term (current) drug therapy: Secondary | ICD-10-CM | POA: Insufficient documentation

## 2015-09-03 DIAGNOSIS — Z5181 Encounter for therapeutic drug level monitoring: Secondary | ICD-10-CM

## 2015-09-03 LAB — PROTIME-INR
INR: 2.14 — AB (ref 0.00–1.49)
PROTHROMBIN TIME: 23.7 s — AB (ref 11.6–15.2)

## 2015-09-05 ENCOUNTER — Other Ambulatory Visit (HOSPITAL_COMMUNITY): Payer: Medicaid Other

## 2015-09-19 ENCOUNTER — Ambulatory Visit (HOSPITAL_COMMUNITY)
Admission: RE | Admit: 2015-09-19 | Discharge: 2015-09-19 | Disposition: A | Discharge status: Home / Self Care | Payer: Medicaid Other | Source: Ambulatory Visit | Attending: Internal Medicine | Admitting: Internal Medicine

## 2015-09-19 ENCOUNTER — Ambulatory Visit (HOSPITAL_COMMUNITY): Payer: Self-pay | Admitting: Infectious Diseases

## 2015-09-19 DIAGNOSIS — Z95811 Presence of heart assist device: Secondary | ICD-10-CM | POA: Insufficient documentation

## 2015-09-19 DIAGNOSIS — Z5181 Encounter for therapeutic drug level monitoring: Secondary | ICD-10-CM | POA: Insufficient documentation

## 2015-09-19 DIAGNOSIS — Z7901 Long term (current) use of anticoagulants: Secondary | ICD-10-CM

## 2015-09-19 LAB — PROTIME-INR
INR: 2.33 — AB (ref 0.00–1.49)
PROTHROMBIN TIME: 25.3 s — AB (ref 11.6–15.2)

## 2015-09-19 NOTE — Addendum Note (Signed)
Encounter addended by: Blanchard Kelch, RN on: 09/19/2015  1:46 PM<BR>     Documentation filed: Charges VN

## 2015-09-25 ENCOUNTER — Other Ambulatory Visit (HOSPITAL_COMMUNITY): Payer: Self-pay | Admitting: Internal Medicine

## 2015-09-25 ENCOUNTER — Other Ambulatory Visit (HOSPITAL_COMMUNITY): Payer: Self-pay | Admitting: Infectious Diseases

## 2015-09-25 DIAGNOSIS — E0590 Thyrotoxicosis, unspecified without thyrotoxic crisis or storm: Secondary | ICD-10-CM

## 2015-09-25 DIAGNOSIS — Z7901 Long term (current) use of anticoagulants: Secondary | ICD-10-CM

## 2015-09-25 DIAGNOSIS — Z01818 Encounter for other preprocedural examination: Secondary | ICD-10-CM

## 2015-09-29 ENCOUNTER — Ambulatory Visit (HOSPITAL_COMMUNITY)
Admission: RE | Admit: 2015-09-29 | Discharge: 2015-09-29 | Disposition: A | Discharge status: Home / Self Care | Payer: Medicaid Other | Source: Ambulatory Visit | Attending: Cardiology | Admitting: Cardiology

## 2015-09-29 DIAGNOSIS — Z95810 Presence of automatic (implantable) cardiac defibrillator: Secondary | ICD-10-CM | POA: Insufficient documentation

## 2015-09-29 DIAGNOSIS — E0580 Other thyrotoxicosis without thyrotoxic crisis or storm: Secondary | ICD-10-CM | POA: Insufficient documentation

## 2015-09-29 DIAGNOSIS — Z7982 Long term (current) use of aspirin: Secondary | ICD-10-CM | POA: Insufficient documentation

## 2015-09-29 DIAGNOSIS — I110 Hypertensive heart disease with heart failure: Secondary | ICD-10-CM | POA: Insufficient documentation

## 2015-09-29 DIAGNOSIS — T829XXA Unspecified complication of cardiac and vascular prosthetic device, implant and graft, initial encounter: Secondary | ICD-10-CM | POA: Diagnosis not present

## 2015-09-29 DIAGNOSIS — Z95811 Presence of heart assist device: Secondary | ICD-10-CM | POA: Insufficient documentation

## 2015-09-29 DIAGNOSIS — Z7952 Long term (current) use of systemic steroids: Secondary | ICD-10-CM | POA: Insufficient documentation

## 2015-09-29 DIAGNOSIS — Z792 Long term (current) use of antibiotics: Secondary | ICD-10-CM | POA: Insufficient documentation

## 2015-09-29 DIAGNOSIS — T462X5A Adverse effect of other antidysrhythmic drugs, initial encounter: Secondary | ICD-10-CM | POA: Diagnosis not present

## 2015-09-29 DIAGNOSIS — T827XXA Infection and inflammatory reaction due to other cardiac and vascular devices, implants and grafts, initial encounter: Secondary | ICD-10-CM

## 2015-09-29 DIAGNOSIS — I5022 Chronic systolic (congestive) heart failure: Secondary | ICD-10-CM | POA: Diagnosis not present

## 2015-09-29 DIAGNOSIS — E0590 Thyrotoxicosis, unspecified without thyrotoxic crisis or storm: Secondary | ICD-10-CM

## 2015-09-29 DIAGNOSIS — Z7682 Awaiting organ transplant status: Secondary | ICD-10-CM | POA: Diagnosis not present

## 2015-09-29 DIAGNOSIS — I428 Other cardiomyopathies: Secondary | ICD-10-CM | POA: Diagnosis not present

## 2015-09-29 DIAGNOSIS — Z79899 Other long term (current) drug therapy: Secondary | ICD-10-CM | POA: Diagnosis not present

## 2015-09-29 DIAGNOSIS — Z01818 Encounter for other preprocedural examination: Secondary | ICD-10-CM

## 2015-09-29 DIAGNOSIS — Z7901 Long term (current) use of anticoagulants: Secondary | ICD-10-CM | POA: Insufficient documentation

## 2015-09-29 LAB — RAPID URINE DRUG SCREEN, HOSP PERFORMED
Amphetamines: NOT DETECTED
BARBITURATES: NOT DETECTED
Benzodiazepines: NOT DETECTED
COCAINE: NOT DETECTED
Opiates: NOT DETECTED
TETRAHYDROCANNABINOL: NOT DETECTED

## 2015-09-29 LAB — HEPATIC FUNCTION PANEL
ALBUMIN: 3.7 g/dL (ref 3.5–5.0)
ALT: 14 U/L — ABNORMAL LOW (ref 17–63)
AST: 18 U/L (ref 15–41)
Alkaline Phosphatase: 78 U/L (ref 38–126)
Bilirubin, Direct: <0.1 mg/dL — ABNORMAL LOW (ref 0.1–0.5)
TOTAL PROTEIN: 6.7 g/dL (ref 6.5–8.1)
Total Bilirubin: 0.8 mg/dL (ref 0.3–1.2)

## 2015-09-29 LAB — CBC
HEMATOCRIT: 42.3 % (ref 39.0–52.0)
HEMOGLOBIN: 14 g/dL (ref 13.0–17.0)
MCH: 28.8 pg (ref 26.0–34.0)
MCHC: 33.1 g/dL (ref 30.0–36.0)
MCV: 87 fL (ref 78.0–100.0)
Platelets: 148 10*3/uL — ABNORMAL LOW (ref 150–400)
RBC: 4.86 MIL/uL (ref 4.22–5.81)
RDW: 14.2 % (ref 11.5–15.5)
WBC: 6.5 10*3/uL (ref 4.0–10.5)

## 2015-09-29 LAB — RAPID HIV SCREEN (HIV 1/2 AB+AG)
HIV 1/2 ANTIBODIES: NONREACTIVE
HIV-1 P24 ANTIGEN - HIV24: NONREACTIVE

## 2015-09-29 LAB — TSH: TSH: 2.253 u[IU]/mL (ref 0.350–4.500)

## 2015-09-29 LAB — T4, FREE: Free T4: 0.88 ng/dL (ref 0.61–1.12)

## 2015-09-29 LAB — ABO/RH: ABO/RH(D): O POS

## 2015-09-29 MED ORDER — CIPROFLOXACIN HCL 500 MG PO TABS: 500.0000 mg | ORAL_TABLET | Freq: Two times a day (BID) | ORAL | Status: DC

## 2015-09-29 NOTE — Addendum Note (Signed)
Encounter addended by: Lebron Quam, RN on: 09/29/2015  2:49 PM<BR>     Documentation filed: Notes Section, Patient Instructions Section

## 2015-09-29 NOTE — Addendum Note (Signed)
Encounter addended by: Levonne Spiller, RN on: 09/29/2015 12:51 PM<BR>     Documentation filed: Visit Diagnoses, Dx Association, Orders

## 2015-09-29 NOTE — Patient Instructions (Signed)
1. Start Cipro 500 mg BID for 14 days. 2. Return to clinic for an INR check on 7/3. 3. Return to clinic for visit on 7/25. 4. Please call if drainage increases or there is any redness, swelling or odor from driveline site.

## 2015-09-29 NOTE — Progress Notes (Addendum)
Symptom  Yes  No  Details   Angina       x Activity:   Claudication       x How far:   Syncope       x When:  Stroke       x   Orthopnea       x How many pillows:  1 pillow  PND       x How often:  CPAP       N/A How many hrs:   Pedal edema       x   Abd fullness       x   N&V       x good appetite  Diaphoresis       x When:  Bleeding      x   Urine color    light yellow  SOB       x Activity:   Palpitations        x      When: less frequent; "beats hard" somestimes  ICD shock       x   Hospitlizaitons       x When/where/why:  ED visit       x When/where/why:  Other MD       x When/who/why:    Activity            decreased due to palpitations  Fluid    No limitations  Diet    No limitations   Vital signs: HR: 75 MAP BP: 100 Auto cuff: 110/79 (89) O2 Sat: 97% Wt: 252.8 lbs Last wt: 254  lbs Ht:  6'2"  LVAD interrogation reveals:  Speed:  9400 Flow: 4.3 Power: 5.3w PI: 7.1 Alarms:  none Events: 2 - 4 daily  Fixed speed:  9400 Low speed limit:  8800  11V battery status: Primary controller: replace 23 months              Secondary controller: replace 10 months   LVAD exit site:  Well healed and incorporated. The velour is fully implanted at exit site. Dressing dry and intact. No erythema, slight amount of light bloody drainage. Stabilization device present and accurately applied. Driveline dressing is being changed q 3 - 5 days per sterile technique using Sorbaview dressing with biopatch on exit site. Pt does self dressing changes. Pt denies fever or chills.  Pt denies any alarms or VAD equipment issues. Pt is completing weekly and monthly maintenance for LVAD equipment. LVAD equipment check completed and is in good working order. Back-up equipment present. LVAD education done on emergency procedures and precautions and reviewed exit site care.  Encounter Details: Pt stated that his driveline got wet a couple of days ago, there was a slight amount of drainage per pt. See  above note for driveline details. Wound culture sent. Pt had 40+ PI events on 6/25 and only 2-8 on other days.    MAP 92 today.  Medtronic device has reached RRT 02/21/15--> has an appointment with EP doc today to discuss replacing. With transplant status and LVAD will not replace  Pt supplied with 10 daily dressings and 10 weekly dressings. Pt instructed that if exit site appears to be moist or if pt is outside a lot that he should use a daily dressing kit opposed to a weekly kit.   Pt instructions: 1. Start Cipro 500 mg BID for 14 days. 2. Return to clinic for an INR check on 7/3. 3. Return  to clinic for visit on 7/25. 4. Please call if drainage increases or there is any redness, swelling or odor from driveline site.   Tanda Rockers, RN VAD Coordinator   Office: [phone] 24/7 VAD Pager: [phone]   LVAD CLINIC NOTE  Patient ID: Robert [name], male   DOB: Dec 20, 1955, 60 y.o.   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 status post dual chamber Medtronic ICD implanted April 2011. Cath 2011 showed normal coronaries. He also has a history of VTach . Currently listed as 1b list for heart tx at [facility].  Dual listed at [facility] and [facility]. Taking all medications.     Had [place] on 11/04/14 as part of dual listing process at [facility]. Normal filling pressures and cardiac output. Amio stopped due to thyrotoxicity. Placed on prednisone and tapazole. Had VT off amio and mexilitene started.  He is here for routine f/u.  He was treated for 7 days for driveline drainage/infection starting 5/23 with ciprofloxacin. Site culture grew Serratia. The drainage stopped.  However, driveline got wet a couple of days ago and there is drainage again. Overall feels ok. Denies SOB/PND/Orthopnea. No fever or chills.  Reports taking Coumadin as prescribed and  adherence to anticoagulation based dietary restrictions.  Denies bright red blood per rectum or melena, no dark urine or hematuria.     LVAD interrogation reveals:  See LVAD nurse's note above.  I reviewed the LVAD parameters from today, and compared the results to the patient's prior recorded data.  No programming changes were made.  The LVAD is functioning within specified parameters.  The patient performs LVAD self-test daily.  LVAD interrogation was negative for any significant power changes, alarms or PI events/speed drops.  LVAD equipment check completed and is in good working order.  Back-up equipment present.   LVAD education done on emergency procedures and precautions and reviewed exit site care.    Past Medical History  Diagnosis Date  . AICD (automatic cardioverter/defibrillator) present 2011  . Hypertension 2000  . Congestive heart failure ([place]) 2011  . Pneumonia   . Bronchitis   . Pulmonary hypertension ([place])     Current Outpatient Prescriptions  Medication Sig Dispense Refill  . allopurinol (ZYLOPRIM) 300 MG tablet Take 1 tablet (300 mg total) by mouth daily. 30 tablet 6  . amLODipine (NORVASC) 10 MG tablet Take 1 tablet (10 mg total) by mouth daily. 90 tablet 3  . aspirin EC 325 MG tablet Take 1 tablet (325 mg total) by mouth daily. 30 tablet 6  . ciprofloxacin (CIPRO) 500 MG tablet Take 1 tablet (500 mg total) by mouth 2 (two) times daily. 14 tablet 0  . ciprofloxacin (CIPRO) 500 MG tablet Take 1 tablet (500 mg total) by mouth 2 (two) times daily. Take twice daily for 14 days 28 tablet 0  . doxazosin (CARDURA) 4 MG tablet TAKE ONE (1) TABLET EACH DAY 90 tablet 3  . doxazosin (CARDURA) 8 MG tablet Take 1 tablet (8 mg total) by mouth daily. 30 tablet 3  . doxycycline (VIBRAMYCIN) 50 MG capsule Take 2 capsules (100 mg total) by mouth 2 (two) times daily. 28 capsule 0  . hydrALAZINE (APRESOLINE) 100 MG tablet Take 0.5 tablets (50 mg total) by mouth 2 (two) times daily. 30  tablet 6  . HYDROcodone-acetaminophen (NORCO/VICODIN) 5-325  MG tablet Take 1 tablet by mouth every 4 (four) hours as needed. 16 tablet 0  . methimazole (TAPAZOLE) 10 MG tablet Take 2 tablets (20 mg total) by mouth 2 (two) times daily. 60 tablet 1  . mexiletine (MEXITIL) 150 MG capsule Take 1 capsule (150 mg total) by mouth 2 (two) times daily. 60 capsule 11  . pantoprazole (PROTONIX) 40 MG tablet TAKE 1 TABLET BY MOUTH EVERY DAY 90 tablet 3  . predniSONE (DELTASONE) 20 MG tablet Take 2 tablets (40 mg total) by mouth daily. 6 tablet 0  . sacubitril-valsartan (ENTRESTO) 97-103 MG Take 1 tablet by mouth 2 (two) times daily. 60 tablet 6  . traMADol (ULTRAM) 50 MG tablet Take 1 tablet (50 mg total) by mouth every 8 (eight) hours as needed for moderate pain. 30 tablet 0  . warfarin (COUMADIN) 5 MG tablet Take 5 mg by mouth daily. Take as directed for INR goal 2.0 - 3.0    . warfarin (COUMADIN) 5 MG tablet TAKE AS DIRECTED BY VAD CLINIC 60 tablet 6   No current facility-administered medications for this encounter.    Imdur and Metoprolol  REVIEW OF SYSTEMS: All systems negative except as listed in HPI, PMH and Problem list.  Vital signs: HR: 75 MAP BP: 100 Auto cuff: 110/79 (89) O2 Sat: 97% Wt: 252.8 lbs Last wt: 254  lbs Ht:  6'2"  Physical Exam: GENERAL: male who presents to clinic today in no acute distress. HEENT: normal  NECK: Supple, JVP flat;  2+ bilaterally, no bruits.  No lymphadenopathy or thyromegaly appreciated.   CARDIAC:  Mechanical heart sounds with LVAD hum present.  LUNGS:  Clear to auscultation bilaterally.  ABDOMEN:  Soft, round, nontender, positive bowel sounds x4.     LVAD exit site: Mild erythema noted around exit site. Small amount of drainage.  Stabilization device present and accurately applied.  Driveline itself with multiple tears (covered with tape) and twists EXTREMITIES:  Warm and dry, no cyanosis, clubbing, rash or edema  L shoulder sore limited  ROM NEUROLOGIC:  Alert and oriented x 4.  Gait steady.  No aphasia.  No dysarthria.  Affect pleasant.     Recent Results (from the past 2160 hour(s))  Protime-INR     Status: Abnormal   Collection Time: 07/07/15  9:55 AM  Result Value Ref Range   Prothrombin Time 21.3 (H) 11.6 - 15.2 seconds   INR 1.85 (H) 0.00 - 1.49  TSH     Status: None   Collection Time: 07/07/15  9:55 AM  Result Value Ref Range   TSH 4.413 0.350 - 4.500 uIU/mL  T4, free     Status: None   Collection Time: 07/07/15  9:55 AM  Result Value Ref Range   Free T4 1.01 0.61 - 1.12 ng/dL  T3, free     Status: None   Collection Time: 07/07/15  9:56 AM  Result Value Ref Range   T3, Free 2.9 2.0 - 4.4 pg/mL    Comment: (NOTE) Performed At: [facility] [place], [place] [id number] Lindon Romp MD UU:[id number]   Protime-INR     Status: Abnormal   Collection Time: 07/14/15 12:09 PM  Result Value Ref Range   Prothrombin Time 26.4 (H) 11.6 - 15.2 seconds   INR 2.47 (H) 0.00 - 1.49  Lipase, blood     Status: None   Collection Time: 07/28/15 11:40 AM  Result Value Ref Range   Lipase 24 11 - 51 U/L  Protime-INR     Status: Abnormal   Collection Time: 07/28/15 12:19 PM  Result Value Ref Range   Prothrombin Time 20.5 (H) 11.6 - 15.2 seconds   INR 1.76 (H) 0.00 - 1.49  Magnesium     Status: None   Collection Time: 07/28/15  2:26 PM  Result Value Ref Range   Magnesium 2.1 1.7 - 2.4 mg/dL  Phosphorus     Status: None   Collection Time: 07/28/15  2:26 PM  Result Value Ref Range   Phosphorus 3.1 2.5 - 4.6 mg/dL  Amylase     Status: None   Collection Time: 07/28/15  2:26 PM  Result Value Ref Range   Amylase 52 28 - 100 U/L  Ferritin     Status: None   Collection Time: 07/28/15  2:26 PM  Result Value Ref Range   Ferritin 56 24 - 336 ng/mL  Iron Binding Cap (TIBC)     Status: Abnormal   Collection Time: 07/28/15  2:26 PM  Result Value Ref Range   Iron 52 45 - 182 ug/dL   TIBC 336 250 -  450 ug/dL   Saturation Ratios 15 (L) 17.9 - 39.5 %   UIBC 284 ug/dL  Uric acid     Status: None   Collection Time: 07/28/15  2:26 PM  Result Value Ref Range   Uric Acid, Serum 4.5 4.4 - 7.6 mg/dL  Lipid Profile     Status: Abnormal   Collection Time: 07/28/15  2:26 PM  Result Value Ref Range   Cholesterol 263 (H) 0 - 200 mg/dL   Triglycerides 134 <150 mg/dL   HDL 60 >40 mg/dL   Total CHOL/HDL Ratio 4.4 RATIO   VLDL 27 0 - 40 mg/dL   LDL Cholesterol 176 (H) 0 - 99 mg/dL    Comment:        Total Cholesterol/HDL:CHD Risk Coronary Heart Disease Risk Table                     Men   Women  1/2 Average Risk   3.4   3.3  Average Risk       5.0   4.4  2 X Average Risk   9.6   7.1  3 X Average Risk  23.4   11.0        Use the calculated Patient Ratio above and the CHD Risk Table to determine the patient's CHD Risk.        ATP III CLASSIFICATION (LDL):  <100     mg/dL   Optimal  100-129  mg/dL   Near or Above                    Optimal  130-159  mg/dL   Borderline  160-189  mg/dL   High  >190     mg/dL   Very High   PSA     Status: None   Collection Time: 07/28/15  2:26 PM  Result Value Ref Range   PSA 0.75 0.00 - 4.00 ng/mL    Comment: (NOTE) While PSA levels of <=4.0 ng/ml are reported as reference range, some men with levels below 4.0 ng/ml can have prostate cancer and many men with PSA above 4.0 ng/ml do not have prostate cancer.  Other tests such as free PSA, age specific reference ranges, PSA velocity and PSA doubling time may be helpful especially in men less than 42 years old.   Rubella screen  Status: None   Collection Time: 07/28/15  2:26 PM  Result Value Ref Range   Rubella 24.20 Immune >0.99 index    Comment: (NOTE)                                Non-immune       <0.90                                Equivocal  0.90 - 0.99                                Immune           >0.99 Performed At: [facility] [place], [place]  [id number] Lindon Romp MD JD:[id number]   Protime-INR     Status: Abnormal   Collection Time: 08/04/15  3:10 PM  Result Value Ref Range   Prothrombin Time 24.6 (H) 11.6 - 15.2 seconds   INR 2.25 (H) 0.00 - 6.12  Basic metabolic panel     Status: Abnormal   Collection Time: 08/26/15 12:48 PM  Result Value Ref Range   Sodium 138 135 - 145 mmol/L   Potassium 3.9 3.5 - 5.1 mmol/L   Chloride 109 101 - 111 mmol/L   CO2 26 22 - 32 mmol/L   Glucose, Bld 93 65 - 99 mg/dL   BUN 9 6 - 20 mg/dL   Creatinine, Ser 0.99 0.61 - 1.24 mg/dL   Calcium 8.6 (L) 8.9 - 10.3 mg/dL   GFR calc non Af Amer >60 >60 mL/min   GFR calc Af Amer >60 >60 mL/min    Comment: (NOTE) The eGFR has been calculated using the CKD EPI equation. This calculation has not been validated in all clinical situations. eGFR's persistently <60 mL/min signify possible Chronic Kidney Disease.    Anion gap 3 (L) 5 - 15  CBC     Status: None   Collection Time: 08/26/15 12:48 PM  Result Value Ref Range   WBC 6.4 4.0 - 10.5 K/uL   RBC 4.68 4.22 - 5.81 MIL/uL   Hemoglobin 13.2 13.0 - 17.0 g/dL   HCT 40.6 39.0 - 52.0 %   MCV 86.8 78.0 - 100.0 fL   MCH 28.2 26.0 - 34.0 pg   MCHC 32.5 30.0 - 36.0 g/dL   RDW 13.6 11.5 - 15.5 %   Platelets 150 150 - 400 K/uL  Protime-INR     Status: Abnormal   Collection Time: 08/26/15 12:48 PM  Result Value Ref Range   Prothrombin Time 23.6 (H) 11.6 - 15.2 seconds   INR 2.12 (H) 0.00 - 1.49  Lactate dehydrogenase     Status: Abnormal   Collection Time: 08/26/15 12:48 PM  Result Value Ref Range   LDH 255 (H) 98 - 192 U/L  TSH     Status: None   Collection Time: 08/26/15 12:48 PM  Result Value Ref Range   TSH 2.303 0.350 - 4.500 uIU/mL  Aerobic Culture (superficial specimen) (NOT AT [facility])     Status: None   Collection Time: 08/26/15  2:49 PM  Result Value Ref Range   Specimen Description WOUND    Special Requests LVAD DRIVELINE    Gram Stain      NO WBC SEEN NO SQUAMOUS EPITHELIAL  CELLS SEEN  NO ORGANISMS SEEN    Culture      RARE SERRATIA MARCESCENS CRITICAL RESULT CALLED TO, READ BACK BY AND VERIFIED WITH: D WOOD 08/27/15 @ 83 M VESTAL    Report Status 08/28/2015 FINAL    Organism ID, Bacteria SERRATIA MARCESCENS       Susceptibility   Serratia marcescens - MIC*    CEFAZOLIN >=64 RESISTANT Resistant     CEFEPIME <=1 SENSITIVE Sensitive     CEFTAZIDIME <=1 SENSITIVE Sensitive     CEFTRIAXONE <=1 SENSITIVE Sensitive     CIPROFLOXACIN <=0.25 SENSITIVE Sensitive     GENTAMICIN <=1 SENSITIVE Sensitive     TRIMETH/SULFA <=20 SENSITIVE Sensitive     * RARE SERRATIA MARCESCENS  Protime-INR     Status: Abnormal   Collection Time: 09/03/15 12:37 PM  Result Value Ref Range   Prothrombin Time 23.7 (H) 11.6 - 15.2 seconds   INR 2.14 (H) 0.00 - 1.49  Protime-INR     Status: Abnormal   Collection Time: 09/19/15 11:00 AM  Result Value Ref Range   Prothrombin Time 25.3 (H) 11.6 - 15.2 seconds   INR 2.33 (H) 0.00 - 1.49  ABO/Rh     Status: None   Collection Time: 09/29/15 11:41 AM  Result Value Ref Range   ABO/RH(D) O POS   Hepatic function panel     Status: Abnormal   Collection Time: 09/29/15 12:00 PM  Result Value Ref Range   Total Protein 6.7 6.5 - 8.1 g/dL   Albumin 3.7 3.5 - 5.0 g/dL   AST 18 15 - 41 U/L   ALT 14 (L) 17 - 63 U/L   Alkaline Phosphatase 78 38 - 126 U/L   Total Bilirubin 0.8 0.3 - 1.2 mg/dL   Bilirubin, Direct <0.1 (L) 0.1 - 0.5 mg/dL   Indirect Bilirubin NOT CALCULATED 0.3 - 0.9 mg/dL  Rapid HIV screen (HIV 1/2 Ab+Ag)     Status: None   Collection Time: 09/29/15 12:00 PM  Result Value Ref Range   HIV-1 P24 Antigen - HIV24 NON REACTIVE NON REACTIVE   HIV 1/2 Antibodies NON REACTIVE NON REACTIVE   Interpretation (HIV Ag Ab)      A non reactive test result means that HIV 1 or HIV 2 antibodies and HIV 1 p24 antigen were not detected in the specimen.  TSH     Status: None   Collection Time: 09/29/15 12:00 PM  Result Value Ref Range   TSH  2.253 0.350 - 4.500 uIU/mL  T4, free     Status: None   Collection Time: 09/29/15 12:00 PM  Result Value Ref Range   Free T4 0.88 0.61 - 1.12 ng/dL    Comment: (NOTE) Biotin ingestion may interfere with free T4 tests. If the results are inconsistent with the TSH level, previous test results, or the clinical presentation, then consider biotin interference. If needed, order repeat testing after stopping biotin.   Urine rapid drug screen (hosp performed)     Status: None   Collection Time: 09/29/15 12:05 PM  Result Value Ref Range   Opiates NONE DETECTED NONE DETECTED   Cocaine NONE DETECTED NONE DETECTED   Benzodiazepines NONE DETECTED NONE DETECTED   Amphetamines NONE DETECTED NONE DETECTED   Tetrahydrocannabinol NONE DETECTED NONE DETECTED   Barbiturates NONE DETECTED NONE DETECTED    Comment:        DRUG SCREEN FOR MEDICAL PURPOSES ONLY.  IF CONFIRMATION IS NEEDED FOR ANY PURPOSE, NOTIFY LAB WITHIN 5 DAYS.  LOWEST DETECTABLE LIMITS FOR URINE DRUG SCREEN Drug Class       Cutoff (ng/mL) Amphetamine      1000 Barbiturate      200 Benzodiazepine   824 Tricyclics       235 Opiates          300 Cocaine          300 THC              50   CBC     Status: Abnormal   Collection Time: 09/29/15  1:10 PM  Result Value Ref Range   WBC 6.5 4.0 - 10.5 K/uL   RBC 4.86 4.22 - 5.81 MIL/uL   Hemoglobin 14.0 13.0 - 17.0 g/dL   HCT 42.3 39.0 - 52.0 %   MCV 87.0 78.0 - 100.0 fL   MCH 28.8 26.0 - 34.0 pg   MCHC 33.1 30.0 - 36.0 g/dL   RDW 14.2 11.5 - 15.5 %   Platelets 148 (L) 150 - 400 K/uL  Aerobic Culture (superficial specimen)     Status: None (Preliminary result)   Collection Time: 09/29/15  1:17 PM  Result Value Ref Range   Specimen Description WOUND    Special Requests DRIVE LINE EXIT SITE    Gram Stain      RARE WBC PRESENT,BOTH PMN AND MONONUCLEAR FEW GRAM VARIABLE ROD RARE GRAM POSITIVE COCCI IN PAIRS    Culture PENDING    Report Status PENDING     ASSESSMENT  AND PLAN:   1)  HTN: MAP ok. Continue Entresto 97/103 bid.  2) Chronic systolic HF, s/p HMII LVAD implant 05/2012. NYHA II. Volume status stable.  - Continue entresto 97-103 twice a day.   - Now dual listed at [facility] and [facility].  - He is not on a BB. He was able to tolerate low-doseToprol before LVAD, however has been intolerant since that time  - Reinforced the need and importance of daily weights, a low sodium diet, and fluid restriction (less than 2 L a day). Instructed to call the HF clinic if weight increases more than 3 lbs overnight or 5 lbs in a week.  3) Palpitations: Resolved. ICD now at EOL. Will not replace. Due to risk of infection, risk felt to be higher than benefit of replacing ICD as VT usually well tolerated in setting of VAD support 4) Hyperthyroidism: Amio-induced. Follows with Dr. Forde Dandy. Remains on tapazole and prednisone.  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 6) PVCs: Much improved with treatment of hyperthyroidism.  7) Anticoagulation with coumadin: Continue coumadin and ASA 325 mg for LVAD. 8) LVAD: Stable VAD parameters. Driveline site mild erythema with some drainage.  Recent Serratia site infection. - Reculture site. - Start Cipro 500 mg bid x 14 days but will adjust as needed based on cultures.    Loralie Champagne 09/30/2015

## 2015-09-29 NOTE — Addendum Note (Signed)
Encounter addended by: Theresia Bough, CMA on: 09/29/2015 12:31 PM<BR>     Documentation filed: Visit Diagnoses, Dx Association, Orders

## 2015-09-30 LAB — CMV IGM

## 2015-09-30 LAB — INFECT DISEASE AB IGM REFLEX 1

## 2015-09-30 LAB — HEPATITIS B SURFACE ANTIBODY, QUANTITATIVE

## 2015-09-30 LAB — HEPATITIS C ANTIBODY

## 2015-09-30 LAB — HEPATITIS B SURFACE ANTIGEN: Hepatitis B Surface Ag: NEGATIVE

## 2015-09-30 LAB — EPSTEIN-BARR VIRUS VCA ANTIBODY PANEL: EBV VCA IgM: <36 U/mL (ref 0.0–35.9)

## 2015-09-30 LAB — VARICELLA ZOSTER ANTIBODY, IGG: VARICELLA IGG: 2535 {index} (ref >165)

## 2015-09-30 LAB — HEPATITIS B CORE ANTIBODY, TOTAL: Hep B Core Total Ab: NEGATIVE

## 2015-09-30 LAB — TOXOPLASMA GONDII ANTIBODY, IGG: Toxoplasma IgG Ratio: <3 IU/mL (ref 0.0–7.1)

## 2015-09-30 LAB — CMV ANTIBODY, IGG (EIA): CMV AB - IGG: 3.6 U/mL — AB (ref 0.00–0.59)

## 2015-09-30 LAB — TOXOPLASMA GONDII ANTIBODY, IGM: Toxoplasma Antibody- IgM: <3 AU/mL (ref 0.0–7.9)

## 2015-09-30 LAB — T3, FREE: T3 FREE: 2.2 pg/mL (ref 2.0–4.4)

## 2015-09-30 LAB — HSV 1 ANTIBODY, IGG: HSV 1 Glycoprotein G Ab, IgG: 33.9 index — ABNORMAL HIGH (ref 0.00–0.90)

## 2015-09-30 NOTE — Addendum Note (Signed)
Encounter addended by: Laurey Morale, MD on: 09/30/2015 12:27 AM<BR>     Documentation filed: Visit Diagnoses, Problem List, Follow-up Section, LOS Section, Charges VN, Notes Section

## 2015-10-01 ENCOUNTER — Other Ambulatory Visit (HOSPITAL_COMMUNITY): Payer: Self-pay | Admitting: Infectious Diseases

## 2015-10-01 DIAGNOSIS — Z01818 Encounter for other preprocedural examination: Secondary | ICD-10-CM

## 2015-10-01 DIAGNOSIS — Z95811 Presence of heart assist device: Secondary | ICD-10-CM

## 2015-10-01 LAB — AEROBIC CULTURE  (SUPERFICIAL SPECIMEN)

## 2015-10-01 LAB — AEROBIC CULTURE W GRAM STAIN (SUPERFICIAL SPECIMEN)

## 2015-10-01 LAB — HIV-1 RNA, QUALITATIVE, TMA: HIV-1 RNA, Qualitative, TMA: NEGATIVE

## 2015-10-01 NOTE — Progress Notes (Signed)
Notification from [facility] Transplant Coordinator Ginnie Smart) of needed studies:  -Echo  -EKG  -CXR  -RHC  -PFTs  Orders entered and will schedule once I contact patient about availability. Printed and faxed results of recent lab work that was drawn here on Monday 09/29/15.   Rexene Alberts, RN VAD Coordinator   Office: [phone] 24/7 VAD Pager: [phone]

## 2015-10-03 ENCOUNTER — Encounter (HOSPITAL_COMMUNITY): Payer: Self-pay | Admitting: Unknown Physician Specialty

## 2015-10-04 LAB — NICOTINE/COTININE METABOLITES
COTININE: NOT DETECTED ng/mL
Nicotine: NOT DETECTED ng/mL

## 2015-10-06 ENCOUNTER — Encounter (HOSPITAL_COMMUNITY): Payer: Self-pay | Admitting: Infectious Diseases

## 2015-10-06 ENCOUNTER — Ambulatory Visit (HOSPITAL_COMMUNITY)
Admission: RE | Admit: 2015-10-06 | Discharge: 2015-10-06 | Disposition: A | Discharge status: Home / Self Care | Payer: Medicaid Other | Source: Ambulatory Visit | Attending: Internal Medicine | Admitting: Internal Medicine

## 2015-10-06 ENCOUNTER — Other Ambulatory Visit (HOSPITAL_COMMUNITY): Payer: Self-pay | Admitting: *Deleted

## 2015-10-06 ENCOUNTER — Ambulatory Visit (HOSPITAL_COMMUNITY): Payer: Self-pay | Admitting: Infectious Diseases

## 2015-10-06 DIAGNOSIS — Z95811 Presence of heart assist device: Secondary | ICD-10-CM

## 2015-10-06 DIAGNOSIS — Z7901 Long term (current) use of anticoagulants: Secondary | ICD-10-CM | POA: Insufficient documentation

## 2015-10-06 DIAGNOSIS — Z5181 Encounter for therapeutic drug level monitoring: Secondary | ICD-10-CM | POA: Diagnosis not present

## 2015-10-06 LAB — PROTIME-INR
INR: 3.3 — AB (ref 0.00–1.49)
PROTHROMBIN TIME: 32.8 s — AB (ref 11.6–15.2)

## 2015-10-08 ENCOUNTER — Other Ambulatory Visit (HOSPITAL_COMMUNITY): Payer: Self-pay | Admitting: Infectious Diseases

## 2015-10-08 DIAGNOSIS — Z01818 Encounter for other preprocedural examination: Secondary | ICD-10-CM

## 2015-10-09 ENCOUNTER — Other Ambulatory Visit (HOSPITAL_COMMUNITY): Payer: Self-pay | Admitting: Infectious Diseases

## 2015-10-09 ENCOUNTER — Encounter: Payer: Self-pay | Admitting: Infectious Diseases

## 2015-10-09 DIAGNOSIS — Z9889 Other specified postprocedural states: Secondary | ICD-10-CM

## 2015-10-10 ENCOUNTER — Encounter (HOSPITAL_COMMUNITY): Payer: Self-pay | Admitting: Infectious Diseases

## 2015-10-20 ENCOUNTER — Encounter (HOSPITAL_COMMUNITY): Payer: Self-pay | Admitting: Infectious Diseases

## 2015-10-20 ENCOUNTER — Ambulatory Visit (HOSPITAL_COMMUNITY): Payer: Self-pay | Admitting: *Deleted

## 2015-10-20 ENCOUNTER — Ambulatory Visit (HOSPITAL_COMMUNITY)
Admission: RE | Admit: 2015-10-20 | Discharge: 2015-10-20 | Disposition: A | Discharge status: Home / Self Care | Payer: Medicaid Other | Source: Ambulatory Visit | Attending: Cardiology | Admitting: Cardiology

## 2015-10-20 DIAGNOSIS — Z01818 Encounter for other preprocedural examination: Secondary | ICD-10-CM

## 2015-10-20 DIAGNOSIS — Z7901 Long term (current) use of anticoagulants: Secondary | ICD-10-CM | POA: Insufficient documentation

## 2015-10-20 DIAGNOSIS — Z5181 Encounter for therapeutic drug level monitoring: Secondary | ICD-10-CM | POA: Diagnosis not present

## 2015-10-20 LAB — ETHANOL: Alcohol, Ethyl (B): <5 mg/dL (ref <5)

## 2015-10-20 LAB — PROTIME-INR
INR: 2.03 — ABNORMAL HIGH (ref 0.00–1.49)
PROTHROMBIN TIME: 22.8 s — AB (ref 11.6–15.2)

## 2015-10-21 LAB — RPR: RPR: NONREACTIVE

## 2015-10-28 ENCOUNTER — Ambulatory Visit (HOSPITAL_COMMUNITY)
Admission: RE | Admit: 2015-10-28 | Discharge: 2015-10-28 | Disposition: A | Discharge status: Home / Self Care | Payer: Medicaid Other | Source: Ambulatory Visit | Attending: Internal Medicine | Admitting: Internal Medicine

## 2015-10-28 ENCOUNTER — Ambulatory Visit (HOSPITAL_COMMUNITY): Payer: Self-pay | Admitting: Unknown Physician Specialty

## 2015-10-28 ENCOUNTER — Ambulatory Visit (HOSPITAL_BASED_OUTPATIENT_CLINIC_OR_DEPARTMENT_OTHER)
Admission: RE | Admit: 2015-10-28 | Discharge: 2015-10-28 | Disposition: A | Discharge status: Home / Self Care | Payer: Medicaid Other | Source: Ambulatory Visit | Attending: Internal Medicine | Admitting: Internal Medicine

## 2015-10-28 ENCOUNTER — Ambulatory Visit (HOSPITAL_COMMUNITY)
Admission: RE | Admit: 2015-10-28 | Discharge: 2015-10-28 | Disposition: A | Discharge status: Home / Self Care | Payer: Medicaid Other | Source: Ambulatory Visit | Attending: Cardiology | Admitting: Cardiology

## 2015-10-28 ENCOUNTER — Encounter (HOSPITAL_COMMUNITY): Payer: Self-pay | Admitting: Unknown Physician Specialty

## 2015-10-28 ENCOUNTER — Other Ambulatory Visit (HOSPITAL_COMMUNITY): Payer: Self-pay | Admitting: *Deleted

## 2015-10-28 VITALS — BP 110/0 | HR 71 | Ht 74.0 in | Wt 255.6 lb

## 2015-10-28 DIAGNOSIS — I10 Essential (primary) hypertension: Secondary | ICD-10-CM

## 2015-10-28 DIAGNOSIS — Z95811 Presence of heart assist device: Secondary | ICD-10-CM

## 2015-10-28 DIAGNOSIS — I5022 Chronic systolic (congestive) heart failure: Secondary | ICD-10-CM

## 2015-10-28 DIAGNOSIS — I429 Cardiomyopathy, unspecified: Secondary | ICD-10-CM | POA: Diagnosis not present

## 2015-10-28 DIAGNOSIS — I509 Heart failure, unspecified: Secondary | ICD-10-CM

## 2015-10-28 DIAGNOSIS — I428 Other cardiomyopathies: Secondary | ICD-10-CM

## 2015-10-28 DIAGNOSIS — I110 Hypertensive heart disease with heart failure: Secondary | ICD-10-CM | POA: Diagnosis present

## 2015-10-28 DIAGNOSIS — Z7901 Long term (current) use of anticoagulants: Secondary | ICD-10-CM

## 2015-10-28 DIAGNOSIS — I255 Ischemic cardiomyopathy: Secondary | ICD-10-CM | POA: Insufficient documentation

## 2015-10-28 DIAGNOSIS — I472 Ventricular tachycardia: Secondary | ICD-10-CM

## 2015-10-28 DIAGNOSIS — Z01818 Encounter for other preprocedural examination: Secondary | ICD-10-CM

## 2015-10-28 DIAGNOSIS — I272 Other secondary pulmonary hypertension: Secondary | ICD-10-CM | POA: Insufficient documentation

## 2015-10-28 DIAGNOSIS — Z95810 Presence of automatic (implantable) cardiac defibrillator: Secondary | ICD-10-CM | POA: Insufficient documentation

## 2015-10-28 DIAGNOSIS — I4729 Other ventricular tachycardia: Secondary | ICD-10-CM

## 2015-10-28 DIAGNOSIS — E0590 Thyrotoxicosis, unspecified without thyrotoxic crisis or storm: Secondary | ICD-10-CM

## 2015-10-28 LAB — PULMONARY FUNCTION TEST
DL/VA % PRED: 77 %
DL/VA: 3.69 ml/min/mmHg/L
DLCO UNC % PRED: 53 %
DLCO unc: 18.64 ml/min/mmHg
FEF 25-75 POST: 3.56 L/s
FEF 25-75 PRE: 2.69 L/s
FEF2575-%CHANGE-POST: 32 %
FEF2575-%PRED-POST: 114 %
FEF2575-%PRED-PRE: 86 %
FEV1-%Change-Post: 8 %
FEV1-%PRED-POST: 83 %
FEV1-%Pred-Pre: 77 %
FEV1-Post: 2.8 L
FEV1-Pre: 2.6 L
FEV1FVC-%CHANGE-POST: 5 %
FEV1FVC-%Pred-Pre: 102 %
FEV6-%CHANGE-POST: 2 %
FEV6-%Pred-Post: 80 %
FEV6-%Pred-Pre: 78 %
FEV6-Post: 3.34 L
FEV6-Pre: 3.25 L
FEV6FVC-%Change-Post: 0 %
FEV6FVC-%Pred-Post: 103 %
FEV6FVC-%Pred-Pre: 102 %
FVC-%CHANGE-POST: 2 %
FVC-%PRED-POST: 77 %
FVC-%Pred-Pre: 75 %
FVC-Post: 3.34 L
FVC-Pre: 3.26 L
POST FEV1/FVC RATIO: 84 %
PRE FEV1/FVC RATIO: 80 %
Post FEV6/FVC ratio: 100 %
Pre FEV6/FVC Ratio: 100 %
RV % pred: 73 %
RV: 1.74 L
TLC % pred: 87 %
TLC: 6.46 L

## 2015-10-28 LAB — BASIC METABOLIC PANEL
ANION GAP: 6 (ref 5–15)
BUN: 11 mg/dL (ref 6–20)
CO2: 28 mmol/L (ref 22–32)
Calcium: 9.1 mg/dL (ref 8.9–10.3)
Chloride: 105 mmol/L (ref 101–111)
Creatinine, Ser: 1.11 mg/dL (ref 0.61–1.24)
GFR calc Af Amer: >60 mL/min (ref >60)
GLUCOSE: 91 mg/dL (ref 65–99)
POTASSIUM: 3.7 mmol/L (ref 3.5–5.1)
Sodium: 139 mmol/L (ref 135–145)

## 2015-10-28 LAB — CBC
HEMATOCRIT: 42.4 % (ref 39.0–52.0)
HEMOGLOBIN: 13.7 g/dL (ref 13.0–17.0)
MCH: 28.2 pg (ref 26.0–34.0)
MCHC: 32.3 g/dL (ref 30.0–36.0)
MCV: 87.2 fL (ref 78.0–100.0)
Platelets: 147 10*3/uL — ABNORMAL LOW (ref 150–400)
RBC: 4.86 MIL/uL (ref 4.22–5.81)
RDW: 14.6 % (ref 11.5–15.5)
WBC: 8.4 10*3/uL (ref 4.0–10.5)

## 2015-10-28 LAB — PROTIME-INR
INR: 2.22 — AB (ref 0.00–1.49)
Prothrombin Time: 24.4 seconds — ABNORMAL HIGH (ref 11.6–15.2)

## 2015-10-28 LAB — LACTATE DEHYDROGENASE: LDH: 267 U/L — ABNORMAL HIGH (ref 98–192)

## 2015-10-28 MED ORDER — ALBUTEROL SULFATE (2.5 MG/3ML) 0.083% IN NEBU
2.5000 mg | INHALATION_SOLUTION | Freq: Once | RESPIRATORY_TRACT | Status: AC
  Administered 2015-10-28: 2.5 mg via RESPIRATORY_TRACT

## 2015-10-28 NOTE — Progress Notes (Signed)
Patient ID: Robert [name], male   DOB: Dec 23, 1955, 60 y.o.   MRN: [id number] Symptom  Yes  No  Details   Angina       x Activity:   Claudication       x How far:   Syncope       x When:  Stroke       x   Orthopnea       x How many pillows:  1 pillow  PND       x How often:  CPAP       N/A How many hrs:   Pedal edema       x   Abd fullness       x   N&V       x good appetite  Diaphoresis       x When:  Bleeding      x   Urine color    light yellow  SOB       x Activity:   Palpitations        x      When: less frequent; "beats hard" somestimes  ICD shock       x   Hospitlizaitons       x When/where/why:  ED visit       x When/where/why:  Other MD       x When/who/why:    Activity            decreased due to palpitations  Fluid    No limitations  Diet    No limitations   Vital signs: HR: 75 MAP BP: 100 Auto cuff: 110/79 (89) O2 Sat: 97% Wt: 252.8 lbs Last wt: 254  lbs Ht:  6'2"  LVAD interrogation reveals:  Speed:  9400 Flow: 4.3 Power: 5.3w PI: 7.1 Alarms:  none Events: 2 - 4 daily  Fixed speed:  9400 Low speed limit:  8800  11V battery status: Primary controller: replace 23 months              Secondary controller: replace 10 months   LVAD exit site:  Well healed and incorporated. The velour is fully implanted at exit site. Dressing dry and intact. No erythema, slight amount of light bloody drainage. Stabilization device present and accurately applied. Driveline dressing is being changed q 3 - 5 days per sterile technique using Sorbaview dressing with biopatch on exit site. Pt does self dressing changes. Pt denies fever or chills.  Pt denies any alarms or VAD equipment issues. Pt is completing weekly and monthly maintenance for LVAD equipment. LVAD equipment check completed and is in good working order. Back-up equipment present. LVAD education done on emergency procedures and precautions and reviewed exit site care.  Encounter Details: Pt stated that his driveline  got wet a couple of days ago, there was a slight amount of drainage per pt. See above note for driveline details. Wound culture sent. Pt had 40+ PI events on 6/25 and only 2-8 on other days.    MAP 92 today.  Medtronic device has reached RRT 02/21/15--> has an appointment with EP doc today to discuss replacing. With transplant status and LVAD will not replace  Pt supplied with 10 daily dressings and 10 weekly dressings. Pt instructed that if exit site appears to be moist or if pt is outside a lot that he should use a daily dressing kit opposed to a weekly kit.   Pt instructions: 1. Start Cipro 500 mg  BID for 14 days. 2. Return to clinic for an INR check on 7/3. 3. Return to clinic for visit on 7/25. 4. Please call if drainage increases or there is any redness, swelling or odor from driveline site.   Robert Rockers, RN VAD Coordinator   Office: [phone] 24/7 VAD Pager: [phone]   LVAD CLINIC NOTE  Patient ID: Robert [name], male   DOB: 01-01-1956, 60 y.o.   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 status post dual chamber Medtronic ICD implanted April 2011. Cath 2011 showed normal coronaries. He also has a history of VTach . Currently listed as 1b list for heart tx at [facility].  Dual listed at [facility] and [facility].      Had [place] on 11/04/14 as part of dual listing process at [facility]. Normal filling pressures and cardiac output. Amio stopped due to thyrotoxicity. Placed on prednisone and tapazole. Had VT off amio and mexilitene started.  He returns for LVAD follow up. Overall feeling ok. Denies SOB/PND/Orthopnea. Has been able to mow his lawn. Missed his appointment with Robert [name]. Taking all medications.No fever or chills. He is changing his own driveline dressing. No drainage around the driveline. Reports taking Coumadin as prescribed and  adherence to anticoagulation based dietary restrictions.  Denies bright red blood per rectum or melena, no dark urine or hematuria.     LVAD interrogation reveals:  See LVAD nurse's note above.  I reviewed the LVAD parameters from today, and compared the results to the patient's prior recorded data.  No programming changes were made.  The LVAD is functioning within specified parameters.  The patient performs LVAD self-test daily.  LVAD interrogation was negative for any significant power changes, alarms or PI events/speed drops.  LVAD equipment check completed and is in good working order.  Back-up equipment present.   LVAD education done on emergency procedures and precautions and reviewed exit site care.    Past Medical History:  Diagnosis Date  . AICD (automatic cardioverter/defibrillator) present 2011  . Bronchitis   . Congestive heart failure ([place]) 2011  . Hypertension 2000  . Pneumonia   . Pulmonary hypertension ([place])     Current Outpatient Prescriptions  Medication Sig Dispense Refill  . allopurinol (ZYLOPRIM) 300 MG tablet Take 1 tablet (300 mg total) by mouth daily. 30 tablet 6  . amLODipine (NORVASC) 10 MG tablet Take 1 tablet (10 mg total) by mouth daily. 90 tablet 3  . aspirin EC 325 MG tablet Take 1 tablet (325 mg total) by mouth daily. 30 tablet 6  . doxazosin (CARDURA) 8 MG tablet Take 1 tablet (8 mg total) by mouth daily. 30 tablet 3  . hydrALAZINE (APRESOLINE) 100 MG tablet Take 0.5 tablets (50 mg total) by mouth 2 (two) times daily. 30 tablet 6  . mexiletine (MEXITIL) 150 MG capsule Take 1 capsule (150 mg total) by mouth 2 (two) times daily. 60 capsule 11  . pantoprazole (PROTONIX) 40 MG tablet TAKE 1 TABLET BY MOUTH EVERY DAY 90 tablet 3  . sacubitril-valsartan (ENTRESTO) 97-103 MG Take 1 tablet by mouth 2 (two) times daily. 60 tablet 6  . traMADol (ULTRAM) 50 MG tablet Take 1 tablet (50 mg total) by mouth every 8 (eight) hours as needed for moderate pain. 30 tablet 0    . warfarin (COUMADIN)  5 MG tablet Take 5 mg by mouth daily. Take as directed for INR goal 2.0 - 3.0    . warfarin (COUMADIN) 5 MG tablet TAKE AS DIRECTED BY VAD CLINIC 60 tablet 6  . ciprofloxacin (CIPRO) 500 MG tablet Take 1 tablet (500 mg total) by mouth 2 (two) times daily. (Patient not taking: Reported on 10/28/2015) 14 tablet 0  . ciprofloxacin (CIPRO) 500 MG tablet Take 1 tablet (500 mg total) by mouth 2 (two) times daily. Take twice daily for 14 days (Patient not taking: Reported on 10/28/2015) 28 tablet 0  . doxazosin (CARDURA) 4 MG tablet TAKE ONE (1) TABLET EACH DAY (Patient not taking: Reported on 10/28/2015) 90 tablet 3  . doxycycline (VIBRAMYCIN) 50 MG capsule Take 2 capsules (100 mg total) by mouth 2 (two) times daily. (Patient not taking: Reported on 10/28/2015) 28 capsule 0  . HYDROcodone-acetaminophen (NORCO/VICODIN) 5-325 MG tablet Take 1 tablet by mouth every 4 (four) hours as needed. (Patient not taking: Reported on 10/28/2015) 16 tablet 0  . methimazole (TAPAZOLE) 10 MG tablet Take 2 tablets (20 mg total) by mouth 2 (two) times daily. (Patient not taking: Reported on 10/28/2015) 60 tablet 1  . predniSONE (DELTASONE) 20 MG tablet Take 2 tablets (40 mg total) by mouth daily. (Patient not taking: Reported on 10/28/2015) 6 tablet 0   No current facility-administered medications for this encounter.     Imdur [isosorbide nitrate] and Metoprolol  REVIEW OF SYSTEMS: All systems negative except as listed in HPI, PMH and Problem list.  Vital signs: HR: 71 MAP BP: 110 Auto cuff: 113/61 (91) O2 Sat: 99% Wt: 255.6 lbs Last wt: 252.8  lbs Ht:  6'2"  Physical Exam: GENERAL: male who presents to clinic today in no acute distress. Ambulated in the clinic without difficulty.  HEENT: normal  NECK: Supple, JVP flat;  2+ bilaterally, no bruits.  No lymphadenopathy or thyromegaly appreciated.   CARDIAC:  Mechanical heart sounds with LVAD hum present.  LUNGS:  Clear ABDOMEN:  Soft, round,  nontender, positive bowel sounds x4.     LVAD exit site: No erythema or exudate around drive line. Stabilization device present and accurately applied.  Driveline itself with multiple tears (covered with tape) and twists EXTREMITIES:  Warm and dry, no cyanosis, clubbing, rash or edema. MAE   NEUROLOGIC:  Alert and oriented x 4.  Gait steady.  No aphasia.  No dysarthria.  Affect pleasant.     Recent Results (from the past 2160 hour(s))  Protime-INR     Status: Abnormal   Collection Time: 08/04/15  3:10 PM  Result Value Ref Range   Prothrombin Time 24.6 (H) 11.6 - 15.2 seconds   INR 2.25 (H) 0.00 - 2.03  Basic metabolic panel     Status: Abnormal   Collection Time: 08/26/15 12:48 PM  Result Value Ref Range   Sodium 138 135 - 145 mmol/L   Potassium 3.9 3.5 - 5.1 mmol/L   Chloride 109 101 - 111 mmol/L   CO2 26 22 - 32 mmol/L   Glucose, Bld 93 65 - 99 mg/dL   BUN 9 6 - 20 mg/dL   Creatinine, Ser 0.99 0.61 - 1.24 mg/dL   Calcium 8.6 (L) 8.9 - 10.3 mg/dL   GFR calc non Af Amer >60 >60 mL/min   GFR calc Af Amer >60 >60 mL/min    Comment: (NOTE) The eGFR has been calculated using the CKD EPI equation. This calculation has not been validated in all clinical situations. eGFR's  persistently <60 mL/min signify possible Chronic Kidney Disease.    Anion gap 3 (L) 5 - 15  CBC     Status: None   Collection Time: 08/26/15 12:48 PM  Result Value Ref Range   WBC 6.4 4.0 - 10.5 K/uL   RBC 4.68 4.22 - 5.81 MIL/uL   Hemoglobin 13.2 13.0 - 17.0 g/dL   HCT 40.6 39.0 - 52.0 %   MCV 86.8 78.0 - 100.0 fL   MCH 28.2 26.0 - 34.0 pg   MCHC 32.5 30.0 - 36.0 g/dL   RDW 13.6 11.5 - 15.5 %   Platelets 150 150 - 400 K/uL  Protime-INR     Status: Abnormal   Collection Time: 08/26/15 12:48 PM  Result Value Ref Range   Prothrombin Time 23.6 (H) 11.6 - 15.2 seconds   INR 2.12 (H) 0.00 - 1.49  Lactate dehydrogenase     Status: Abnormal   Collection Time: 08/26/15 12:48 PM  Result Value Ref Range   LDH 255  (H) 98 - 192 U/L  TSH     Status: None   Collection Time: 08/26/15 12:48 PM  Result Value Ref Range   TSH 2.303 0.350 - 4.500 uIU/mL  Aerobic Culture (superficial specimen) (NOT AT [facility])     Status: None   Collection Time: 08/26/15  2:49 PM  Result Value Ref Range   Specimen Description WOUND    Special Requests LVAD DRIVELINE    Gram Stain      NO WBC SEEN NO SQUAMOUS EPITHELIAL CELLS SEEN NO ORGANISMS SEEN    Culture      RARE SERRATIA MARCESCENS CRITICAL RESULT CALLED TO, READ BACK BY AND VERIFIED WITH: D WOOD 08/27/15 @ 42 M VESTAL    Report Status 08/28/2015 FINAL    Organism ID, Bacteria SERRATIA MARCESCENS       Susceptibility   Serratia marcescens - MIC*    CEFAZOLIN >=64 RESISTANT Resistant     CEFEPIME <=1 SENSITIVE Sensitive     CEFTAZIDIME <=1 SENSITIVE Sensitive     CEFTRIAXONE <=1 SENSITIVE Sensitive     CIPROFLOXACIN <=0.25 SENSITIVE Sensitive     GENTAMICIN <=1 SENSITIVE Sensitive     TRIMETH/SULFA <=20 SENSITIVE Sensitive     * RARE SERRATIA MARCESCENS  Protime-INR     Status: Abnormal   Collection Time: 09/03/15 12:37 PM  Result Value Ref Range   Prothrombin Time 23.7 (H) 11.6 - 15.2 seconds   INR 2.14 (H) 0.00 - 1.49  Protime-INR     Status: Abnormal   Collection Time: 09/19/15 11:00 AM  Result Value Ref Range   Prothrombin Time 25.3 (H) 11.6 - 15.2 seconds   INR 2.33 (H) 0.00 - 1.49  ABO/Rh     Status: None   Collection Time: 09/29/15 11:41 AM  Result Value Ref Range   ABO/RH(D) O POS   Hepatic function panel     Status: Abnormal   Collection Time: 09/29/15 12:00 PM  Result Value Ref Range   Total Protein 6.7 6.5 - 8.1 g/dL   Albumin 3.7 3.5 - 5.0 g/dL   AST 18 15 - 41 U/L   ALT 14 (L) 17 - 63 U/L   Alkaline Phosphatase 78 38 - 126 U/L   Total Bilirubin 0.8 0.3 - 1.2 mg/dL   Bilirubin, Direct <0.1 (L) 0.1 - 0.5 mg/dL   Indirect Bilirubin NOT CALCULATED 0.3 - 0.9 mg/dL  Rapid HIV screen (HIV 1/2 Ab+Ag)     Status: None   Collection Time:  09/29/15 12:00 PM  Result Value Ref Range   HIV-1 P24 Antigen - HIV24 NON REACTIVE NON REACTIVE   HIV 1/2 Antibodies NON REACTIVE NON REACTIVE   Interpretation (HIV Ag Ab)      A non reactive test result means that HIV 1 or HIV 2 antibodies and HIV 1 p24 antigen were not detected in the specimen.  HIV-1 DNA qualitative by PCR, blood     Status: None   Collection Time: 09/29/15 12:00 PM  Result Value Ref Range   HIV-1 RNA, Qualitative, TMA Negative Negative    Comment: (NOTE) Negative for HIV-1 RNA Performed At: [facility] [place], [place] [id number] Lindon Romp MD JO:[id number]   Cmv antibody, IgG (EIA)     Status: Abnormal   Collection Time: 09/29/15 12:00 PM  Result Value Ref Range   CMV Ab - IgG 3.60 (H) 0.00 - 0.59 U/mL    Comment: (NOTE)                               Negative          <0.60                               Equivocal   0.60 - 0.69                               Positive          >0.69 Performed At: [facility] [place], [place] [id number] Lindon Romp MD OQ:[id number]   CMV IgM     Status: None   Collection Time: 09/29/15 12:00 PM  Result Value Ref Range   CMV IgM <30.0 0.0 - 29.9 AU/mL    Comment: (NOTE)                                Negative         <30.0                                Equivocal  30.0 - 34.9                                Positive         >34.9 A positive result is generally indicative of acute infection, reactivation or persistent IgM production. Performed At: [facility] [address] [place], [place] [id number] Lindon Romp MD NT:[id number]   Varicella zoster antibody, IgG     Status: None   Collection Time: 09/29/15 12:00 PM  Result Value Ref Range   Varicella IgG 2,535 Immune >165 index    Comment: (NOTE)                               Negative          <135                               Equivocal    135 - 165  Positive           >165 A positive result generally indicates exposure to the pathogen or administration of specific immunoglobulins, but it is not indication of active infection or stage of disease. Performed At: [facility] [place], [place] [id number] Lindon Romp MD ST:[id number]   HSV 1 antibody, IgG     Status: Abnormal   Collection Time: 09/29/15 12:00 PM  Result Value Ref Range   HSV 1 Glycoprotein G Ab, IgG 33.90 (H) 0.00 - 0.90 index    Comment: (NOTE)                                 Negative        <0.91                                 Equivocal 0.91 - 1.09                                 Positive        >1.09 Note: Negative indicates no antibodies detected to HSV-1. Equivocal may suggest early infection.  If clinically appropriate, retest at later date. Positive indicates antibodies detected to HSV-1. Performed At: [facility] [place], [place] [id number] Lindon Romp MD EY:[id number]   Hepatitis B surface antibody     Status: Abnormal   Collection Time: 09/29/15 12:00 PM  Result Value Ref Range   Hepatitis B-Post <3.1 (L) Immunity>9.9 mIU/mL    Comment: (NOTE)  Status of Immunity                     Anti-HBs Level  ------------------                     -------------- Inconsistent with Immunity                   0.0 - 9.9 Consistent with Immunity                          >9.9 Performed At: [facility] [place], [place] [id number] Lindon Romp MD [id number]   Hepatitis B surface antigen     Status: None   Collection Time: 09/29/15 12:00 PM  Result Value Ref Range   Hepatitis B Surface Ag Negative Negative    Comment: (NOTE) Performed At: [facility] [address] [place], [place] [id number] Lindon Romp MD NO:[id number]   Hepatitis B core antibody, total     Status: None   Collection Time: 09/29/15 12:00 PM  Result Value Ref Range   Hep B Core Total Ab Negative Negative     Comment: (NOTE) Performed At: [facility] [address] [place], [place] [id number] Lindon Romp MD PT:[id number]   Hepatitis C Antibody     Status: None   Collection Time: 09/29/15 12:00 PM  Result Value Ref Range   HCV Ab <0.1 0.0 - 0.9 s/co ratio    Comment: (NOTE)                                  Negative:     < 0.8  Indeterminate: 0.8 - 0.9                                  Positive:     > 0.9 The CDC recommends that a positive HCV antibody result be followed up with a HCV Nucleic Acid Amplification test (703500). Performed At: [facility] [place], [place] [id number] Lindon Romp MD ZJ:[id number]   Epstein-Barr Virus VCA Antibody Panel     Status: Abnormal   Collection Time: 09/29/15 12:00 PM  Result Value Ref Range   EBV VCA IgG <18.0 0.0 - 17.9 U/mL    Comment: (NOTE)                                 Negative        <18.0                                 Equivocal 18.0 - 21.9                                 Positive        >21.9    EBV VCA IgM <36.0 0.0 - 35.9 U/mL    Comment: (NOTE)                                 Negative        <36.0                                 Equivocal 36.0 - 43.9                                 Positive        >43.9    EBV NA IgG >600.0 (H) 0.0 - 17.9 U/mL    Comment: (NOTE)                                 Negative        <18.0                                 Equivocal 18.0 - 21.9                                 Positive        >21.9    EBV Early Antigen Ab, IgG <9.0 0.0 - 8.9 U/mL    Comment: (NOTE)                                 Negative        < 9.0                                 Equivocal  9.0 - 10.9                                 Positive        >10.9    Interpretation: Comment     Comment: (NOTE)               EBV Interpretation Chart Interpretation   EBV-IgM  EA(D)-IgG  VCA-IgG  EBNA-IgG EBV Seronegative    -        -         -          - Early Phase          +        -         -          - Acute Primary       +       +or-       +          - Infection Convalescence/Past  -       +or-       +          + Infection Reactivated        +or-      +         +          + Infection       + Antibody Present      - Antibody Absent Performed At: [facility] [address] [place], [place] [id number] Lindon Romp MD [id number]   Toxoplasma gondii antibody, IgG     Status: None   Collection Time: 09/29/15 12:00 PM  Result Value Ref Range   Toxoplasma IgG Ratio <3.0 0.0 - 7.1 IU/mL    Comment: (NOTE)                                 Negative        <7.2                                 Equivocal  7.2 - 8.7                                 Positive        >8.7 Performed At: [facility] [address] [place], [place] [id number] Lindon Romp MD XN:[id number]   Toxoplasma gondii antibody, IgM     Status: None   Collection Time: 09/29/15 12:00 PM  Result Value Ref Range   Toxoplasma Antibody- IgM <3.0 0.0 - 7.9 AU/mL    Comment: (NOTE)                             Negative            <8.0                             Equivocal      8.0 - 9.9  Positive            >9.9 Performed At: [facility] [place], [place] [id number] Lindon Romp MD [id number]   Nicotine/cotinine metabolites     Status: None   Collection Time: 09/29/15 12:00 PM  Result Value Ref Range   Nicotine None Detected ng/mL    Comment: (NOTE) Nicotine levels greater than 2.0 are consistent with the use of tobacco or tobacco cessation products.    Cotinine None Detected ng/mL    Comment: (NOTE) Cotinine levels greater than 20.0 are consistent with the use of tobacco or tobacco cessation products. Performed At: [facility] [place], [place] [id number] Lindon Romp MD BM:[id number]   TSH     Status: None   Collection Time: 09/29/15 12:00 PM  Result Value Ref Range   TSH  2.253 0.350 - 4.500 uIU/mL  T4, free     Status: None   Collection Time: 09/29/15 12:00 PM  Result Value Ref Range   Free T4 0.88 0.61 - 1.12 ng/dL    Comment: (NOTE) Biotin ingestion may interfere with free T4 tests. If the results are inconsistent with the TSH level, previous test results, or the clinical presentation, then consider biotin interference. If needed, order repeat testing after stopping biotin.   T3, free     Status: None   Collection Time: 09/29/15 12:00 PM  Result Value Ref Range   T3, Free 2.2 2.0 - 4.4 pg/mL    Comment: (NOTE) Performed At: [facility] [address] [place], [place] [id number] Lindon Romp MD IH:[id number]   Comment     Status: None   Collection Time: 09/29/15 12:00 PM  Result Value Ref Range   Infect disease Ab comment 1 Comment     Comment: (NOTE) It is presumed the patient has not been infected with and is not undergoing an acute infection with Toxoplasma. If symptoms persist, submit a new specimen after three weeks. Performed At: [facility] [place], [place] [id number] Lindon Romp MD JO:[id number]   Urine rapid drug screen (hosp performed)     Status: None   Collection Time: 09/29/15 12:05 PM  Result Value Ref Range   Opiates NONE DETECTED NONE DETECTED   Cocaine NONE DETECTED NONE DETECTED   Benzodiazepines NONE DETECTED NONE DETECTED   Amphetamines NONE DETECTED NONE DETECTED   Tetrahydrocannabinol NONE DETECTED NONE DETECTED   Barbiturates NONE DETECTED NONE DETECTED    Comment:        DRUG SCREEN FOR MEDICAL PURPOSES ONLY.  IF CONFIRMATION IS NEEDED FOR ANY PURPOSE, NOTIFY LAB WITHIN 5 DAYS.        LOWEST DETECTABLE LIMITS FOR URINE DRUG SCREEN Drug Class       Cutoff (ng/mL) Amphetamine      1000 Barbiturate      200 Benzodiazepine   601 Tricyclics       093 Opiates          300 Cocaine          300 THC              50   CBC     Status: Abnormal   Collection Time:  09/29/15  1:10 PM  Result Value Ref Range   WBC 6.5 4.0 - 10.5 K/uL   RBC 4.86 4.22 - 5.81 MIL/uL   Hemoglobin 14.0 13.0 - 17.0 g/dL   HCT 42.3 39.0 - 52.0 %   MCV 87.0 78.0 -  100.0 fL   MCH 28.8 26.0 - 34.0 pg   MCHC 33.1 30.0 - 36.0 g/dL   RDW 14.2 11.5 - 15.5 %   Platelets 148 (L) 150 - 400 K/uL  Aerobic Culture (superficial specimen)     Status: None   Collection Time: 09/29/15  1:17 PM  Result Value Ref Range   Specimen Description WOUND    Special Requests DRIVE LINE EXIT SITE    Gram Stain      RARE WBC PRESENT,BOTH PMN AND MONONUCLEAR FEW GRAM VARIABLE ROD RARE GRAM POSITIVE COCCI IN PAIRS    Culture ABUNDANT SERRATIA MARCESCENS    Report Status 10/01/2015 FINAL    Organism ID, Bacteria SERRATIA MARCESCENS       Susceptibility   Serratia marcescens - MIC*    CEFAZOLIN >=64 RESISTANT Resistant     CEFEPIME <=1 SENSITIVE Sensitive     CEFTAZIDIME <=1 SENSITIVE Sensitive     CEFTRIAXONE <=1 SENSITIVE Sensitive     CIPROFLOXACIN 1 SENSITIVE Sensitive     GENTAMICIN <=1 SENSITIVE Sensitive     TRIMETH/SULFA <=20 SENSITIVE Sensitive     * ABUNDANT SERRATIA MARCESCENS  Protime-INR     Status: Abnormal   Collection Time: 10/06/15 11:53 AM  Result Value Ref Range   Prothrombin Time 32.8 (H) 11.6 - 15.2 seconds   INR 3.30 (H) 0.00 - 1.49  Protime-INR     Status: Abnormal   Collection Time: 10/20/15 12:30 PM  Result Value Ref Range   Prothrombin Time 22.8 (H) 11.6 - 15.2 seconds   INR 2.03 (H) 0.00 - 1.49  RPR     Status: None   Collection Time: 10/20/15 12:30 PM  Result Value Ref Range   RPR Ser Ql Non Reactive Non Reactive    Comment: (NOTE) Performed At: [facility] [place], [place] [id number] Lindon Romp MD HX:[id number]   Ethanol     Status: None   Collection Time: 10/20/15 12:30 PM  Result Value Ref Range   Alcohol, Ethyl (B) <5 <5 mg/dL    Comment:        LOWEST DETECTABLE LIMIT FOR SERUM ALCOHOL IS 5 mg/dL FOR MEDICAL  PURPOSES ONLY   Pulmonary function test     Status: None (Preliminary result)   Collection Time: 10/28/15  9:59 AM  Result Value Ref Range   FVC-Pre 3.26 L   FVC-%Pred-Pre 75 %   FVC-Post 3.34 L   FVC-%Pred-Post 77 %   FVC-%Change-Post 2 %   FEV1-Pre 2.60 L   FEV1-%Pred-Pre 77 %   FEV1-Post 2.80 L   FEV1-%Pred-Post 83 %   FEV1-%Change-Post 8 %   FEV6-Pre 3.25 L   FEV6-%Pred-Pre 78 %   FEV6-Post 3.34 L   FEV6-%Pred-Post 80 %   FEV6-%Change-Post 2 %   Pre FEV1/FVC ratio 80 %   FEV1FVC-%Pred-Pre 102 %   Post FEV1/FVC ratio 84 %   FEV1FVC-%Change-Post 5 %   Pre FEV6/FVC Ratio 100 %   FEV6FVC-%Pred-Pre 102 %   Post FEV6/FVC ratio 100 %   FEV6FVC-%Pred-Post 103 %   FEV6FVC-%Change-Post 0 %   FEF 25-75 Pre 2.69 L/sec   FEF2575-%Pred-Pre 86 %   FEF 25-75 Post 3.56 L/sec   FEF2575-%Pred-Post 114 %   FEF2575-%Change-Post 32 %   RV 1.74 L   RV % pred 73 %   TLC 6.46 L   TLC % pred 87 %   DLCO unc 18.64 ml/min/mmHg   DLCO unc % pred 53 %  DL/VA 3.69 ml/min/mmHg/L   DL/VA % pred 77 %  Basic metabolic panel     Status: None   Collection Time: 10/28/15 12:15 PM  Result Value Ref Range   Sodium 139 135 - 145 mmol/L   Potassium 3.7 3.5 - 5.1 mmol/L   Chloride 105 101 - 111 mmol/L   CO2 28 22 - 32 mmol/L   Glucose, Bld 91 65 - 99 mg/dL   BUN 11 6 - 20 mg/dL   Creatinine, Ser 1.11 0.61 - 1.24 mg/dL   Calcium 9.1 8.9 - 10.3 mg/dL   GFR calc non Af Amer >60 >60 mL/min   GFR calc Af Amer >60 >60 mL/min    Comment: (NOTE) The eGFR has been calculated using the CKD EPI equation. This calculation has not been validated in all clinical situations. eGFR's persistently <60 mL/min signify possible Chronic Kidney Disease.    Anion gap 6 5 - 15  CBC     Status: Abnormal   Collection Time: 10/28/15 12:15 PM  Result Value Ref Range   WBC 8.4 4.0 - 10.5 K/uL   RBC 4.86 4.22 - 5.81 MIL/uL   Hemoglobin 13.7 13.0 - 17.0 g/dL   HCT 42.4 39.0 - 52.0 %   MCV 87.2 78.0 - 100.0 fL   MCH  28.2 26.0 - 34.0 pg   MCHC 32.3 30.0 - 36.0 g/dL   RDW 14.6 11.5 - 15.5 %   Platelets 147 (L) 150 - 400 K/uL  Protime-INR     Status: Abnormal   Collection Time: 10/28/15 12:15 PM  Result Value Ref Range   Prothrombin Time 24.4 (H) 11.6 - 15.2 seconds   INR 2.22 (H) 0.00 - 1.49  Lactate dehydrogenase     Status: Abnormal   Collection Time: 10/28/15 12:15 PM  Result Value Ref Range   LDH 267 (H) 98 - 192 U/L    ASSESSMENT AND PLAN:   1)  HTN: MAP ok. Continue Entresto 97/103 bid.  2) Chronic systolic HF, s/p HMII LVAD implant 05/2012. NYHA II.  Volume status stable.  - Continue entresto 97-103 twice a day.   - Now dual listed at [facility] and [facility].  - He is not on a BB. He was able to tolerate low-doseToprol before LVAD, however has been intolerant since that time  - Reinforced the need and importance of daily weights, a low sodium diet, and fluid restriction (less than 2 L a day). Instructed to call the HF clinic if weight increases more than 3 lbs overnight or 5 lbs in a week.  3) Palpitations: Resolved. ICD now at EOL. Will not replace. Due to risk of infection, risk felt to be higher than benefit of replacing ICD as VT usually well tolerated in setting of VAD support 4) Hyperthyroidism: Amio-induced. Needs follow up with Robert. Forde [name]. Currently off tapazole and prednisone.  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 6 Anticoagulation with coumadin: Continue coumadin and ASA 325 mg for LVAD. Todays INR 2.22 7) LVAD: Stable VAD parameters. Driveline site intact. Recently completed Cipro 500 mg bid x 14 days.  See VAD coordinator below.    Todays labs reviewed. CBC, BMET, INR stable. Had ECHO earlier today as part of transplant work up. Plan to [place] next week. Follow up in 2 months.   Tasnim Balentine NP-C  10/28/2015   Details      Angina       x Activity:   Claudication       x How far:  Syncope       x When:  Stroke       x   Orthopnea       x How many pillows:  1 pillow  PND        x How often:  CPAP       N/A How many hrs:   Pedal edema       x   Abd fullness       x   N&V       x good appetite  Diaphoresis       x When:  Bleeding      x   Urine color    light yellow  SOB       x Activity:   Palpitations        x      When: less frequent; "beats hard" somestimes  ICD shock       x   Hospitlizaitons       x When/where/why:  ED visit       x When/where/why:  Other MD       x When/who/why:    Activity            decreased due to palpitations  Fluid    No limitations  Diet    No limitations   Vital signs: HR: 71 MAP BP: 110 Auto cuff: 113/61 (91) O2 Sat: 99% Wt: 255.6 lbs Last wt: 252.8  lbs Ht:  6'2"  LVAD interrogation reveals:  Speed:  9400 Flow: 5.3 Power: 5.9w PI: 6.7 Alarms:  none Events: 8-10 daily; outlier on  7/14-22+ PI events- Pt states he may have been mowing the yard  Fixed speed:  9400 Low speed limit:  8800  11V battery status: Primary controller: replace 21 months                                                        Secondary controller: replace 10 months   LVAD exit site:  Well healed and incorporated. The velour is fully implanted at exit site. Dressing dry and intact. No erythema, slight amount of serous drainage. Stabilization device present and accurately applied. Driveline dressing is being changed daily per sterile technique using split  gauze dressing. Pt does self dressing changes. Pt denies fever or chills.  Pt denies any alarms or VAD equipment issues. Pt is requesting the aquaseal strips again for his driveline site. Pt was instructed to do daily dressing changes with aquaseal strip. Pt instructed to notify us if he has increase drainage, redness, tenderness or odor from the site.   Encounter Details: MAP 91 today.  Pt supplied with 14 daily dressings and 5 anchors.   Pt instructions: 1. Return to clinic for an INR check in 2 weeks. 2. Return to clinic for visit in 2 months. 3. Please call  if drainage increases or there is any redness, swelling or odor from driveline site.   Robert Rockers, RN VAD Coordinator   Office: [phone] 24/7 VAD Pager: [phone]

## 2015-10-28 NOTE — Progress Notes (Signed)
  Echocardiogram 2D Echocardiogram has been performed.  Robert [name] 10/28/2015, 12:26 PM

## 2015-10-28 NOTE — Progress Notes (Signed)
Symptom  Yes  No  Details   Angina       x Activity:   Claudication       x How far:   Syncope       x When:  Stroke       x   Orthopnea       x How many pillows:  1 pillow  PND       x How often:  CPAP       N/A How many hrs:   Pedal edema       x   Abd fullness       x   N&V       x good appetite  Diaphoresis       x When:  Bleeding      x   Urine color    light yellow  SOB       x Activity:   Palpitations        x      When: less frequent; "beats hard" somestimes  ICD shock       x   Hospitlizaitons       x When/where/why:  ED visit       x When/where/why:  Other MD       x When/who/why:    Activity            decreased due to palpitations  Fluid    No limitations  Diet    No limitations   Vital signs: HR: 71 MAP BP: 110 Auto cuff: 113/61 (91) O2 Sat: 99% Wt: 255.6 lbs Last wt: 252.8  lbs Ht:  6'2"  LVAD interrogation reveals:  Speed:  9400 Flow: 5.3 Power: 5.9w PI: 6.7 Alarms:  none Events: 8-10 daily; outlier on  7/14-22+ PI events- Pt states he may have been mowing the yard  Fixed speed:  9400 Low speed limit:  8800  11V battery status: Primary controller: replace 21 months              Secondary controller: replace 10 months   LVAD exit site:  Well healed and incorporated. The velour is fully implanted at exit site. Dressing dry and intact. No erythema, slight amount of serous drainage. Stabilization device present and accurately applied. Driveline dressing is being changed daily per sterile technique using split  gauze dressing. Pt does self dressing changes. Pt denies fever or chills.  Pt denies any alarms or VAD equipment issues. Pt is requesting the aquaseal strips again for his driveline site. Pt was instructed to do daily dressing changes with aquaseal strip. Pt instructed to notify us if he has increase drainage, redness, tenderness or odor from the site.   Encounter Details: MAP 91 today.  Pt supplied with 14 daily dressings and 5 anchors.   Pt  instructions: 1. Return to clinic for an INR check in 2 weeks. 2. Return to clinic for visit in 2 months. 3. Please call if drainage increases or there is any redness, swelling or odor from driveline site.   Carlton Adam, RN VAD Coordinator   Office: [phone] 24/7 VAD Pager: [phone]

## 2015-10-29 NOTE — Addendum Note (Signed)
Encounter addended by: Lebron Quam, RN on: 10/29/2015  8:08 AM<BR>    Actions taken: Order Reconciliation Section accessed, Order Entry activity accessed, Home Medications modified

## 2015-10-31 ENCOUNTER — Telehealth: Payer: Self-pay | Admitting: Unknown Physician Specialty

## 2015-10-31 NOTE — Telephone Encounter (Signed)
Pt returned phone call regarding his follow up plan for thyroid. When seen this week our NP is recommending he f/u  With his PCP or endocrinologist regarding his thyroid. Pt states that his PCP told him that he no longer needed medication for his thyroid. Pt is aware of our recommendations.

## 2015-11-05 ENCOUNTER — Other Ambulatory Visit: Payer: Self-pay | Admitting: Infectious Diseases

## 2015-11-06 ENCOUNTER — Ambulatory Visit (HOSPITAL_COMMUNITY): Payer: Self-pay | Admitting: *Deleted

## 2015-11-06 ENCOUNTER — Ambulatory Visit (HOSPITAL_COMMUNITY)
Admission: RE | Admit: 2015-11-06 | Discharge: 2015-11-06 | Disposition: A | Discharge status: Home / Self Care | Payer: Medicaid Other | Source: Ambulatory Visit | Attending: Internal Medicine | Admitting: Internal Medicine

## 2015-11-06 ENCOUNTER — Encounter (HOSPITAL_COMMUNITY): Payer: Self-pay | Admitting: *Deleted

## 2015-11-06 ENCOUNTER — Encounter (HOSPITAL_COMMUNITY)
Admission: RE | Disposition: A | Discharge status: Home / Self Care | Payer: Self-pay | Source: Ambulatory Visit | Attending: Internal Medicine

## 2015-11-06 DIAGNOSIS — Z9889 Other specified postprocedural states: Secondary | ICD-10-CM

## 2015-11-06 DIAGNOSIS — I5022 Chronic systolic (congestive) heart failure: Secondary | ICD-10-CM | POA: Diagnosis present

## 2015-11-06 DIAGNOSIS — I272 Other secondary pulmonary hypertension: Secondary | ICD-10-CM | POA: Insufficient documentation

## 2015-11-06 DIAGNOSIS — I110 Hypertensive heart disease with heart failure: Secondary | ICD-10-CM | POA: Diagnosis not present

## 2015-11-06 DIAGNOSIS — Z95811 Presence of heart assist device: Secondary | ICD-10-CM

## 2015-11-06 DIAGNOSIS — Z95810 Presence of automatic (implantable) cardiac defibrillator: Secondary | ICD-10-CM | POA: Diagnosis not present

## 2015-11-06 DIAGNOSIS — Z7982 Long term (current) use of aspirin: Secondary | ICD-10-CM | POA: Insufficient documentation

## 2015-11-06 DIAGNOSIS — I428 Other cardiomyopathies: Secondary | ICD-10-CM

## 2015-11-06 DIAGNOSIS — Z7901 Long term (current) use of anticoagulants: Secondary | ICD-10-CM | POA: Insufficient documentation

## 2015-11-06 HISTORY — PX: CARDIAC CATHETERIZATION: SHX172

## 2015-11-06 LAB — POCT I-STAT 3, VENOUS BLOOD GAS (G3P V)
BICARBONATE: 24.9 meq/L — AB (ref 20.0–24.0)
BICARBONATE: 25.4 meq/L — AB (ref 20.0–24.0)
O2 SAT: 69 %
O2 SAT: 70 %
PCO2 VEN: 41.5 mmHg — AB (ref 45.0–50.0)
PO2 VEN: 37 mmHg (ref 31.0–45.0)
TCO2: 26 mmol/L (ref 0–100)
TCO2: 27 mmol/L (ref 0–100)
pCO2, Ven: 43.2 mmHg — ABNORMAL LOW (ref 45.0–50.0)
pH, Ven: 7.377 — ABNORMAL HIGH (ref 7.250–7.300)
pH, Ven: 7.385 — ABNORMAL HIGH (ref 7.250–7.300)
pO2, Ven: 38 mmHg (ref 31.0–45.0)

## 2015-11-06 LAB — PROTIME-INR
INR: 2.47
PROTHROMBIN TIME: 27.2 s — AB (ref 11.4–15.2)

## 2015-11-06 SURGERY — RIGHT HEART CATH
Anesthesia: LOCAL

## 2015-11-06 MED ORDER — SODIUM CHLORIDE 0.9% FLUSH: 3.0000 mL | Freq: Two times a day (BID) | INTRAVENOUS | Status: DC

## 2015-11-06 MED ORDER — SODIUM CHLORIDE 0.9 % IV SOLN
250.0000 mL | INTRAVENOUS | Status: DC | PRN
Start: 2015-11-06 — End: 2015-11-06

## 2015-11-06 MED ORDER — SODIUM CHLORIDE 0.9 % IV SOLN: 250.0000 mL | INTRAVENOUS | Status: DC | PRN

## 2015-11-06 MED ORDER — LIDOCAINE HCL (PF) 1 % IJ SOLN
INTRAMUSCULAR | Status: AC
  Filled 2015-11-06: qty 30

## 2015-11-06 MED ORDER — ONDANSETRON HCL 4 MG/2ML IJ SOLN: 4.0000 mg | Freq: Four times a day (QID) | INTRAMUSCULAR | Status: DC | PRN

## 2015-11-06 MED ORDER — SODIUM CHLORIDE 0.9% FLUSH: 3.0000 mL | INTRAVENOUS | Status: DC | PRN

## 2015-11-06 MED ORDER — VANCOMYCIN HCL IN DEXTROSE 1-5 GM/200ML-% IV SOLN
INTRAVENOUS | Status: AC
  Filled 2015-11-06: qty 200

## 2015-11-06 MED ORDER — VANCOMYCIN HCL IN DEXTROSE 1-5 GM/200ML-% IV SOLN
1000.0000 mg | Freq: Once | INTRAVENOUS | Status: AC
  Administered 2015-11-06: 1000 mg via INTRAVENOUS

## 2015-11-06 MED ORDER — ACETAMINOPHEN 325 MG PO TABS: 650.0000 mg | ORAL_TABLET | ORAL | Status: DC | PRN

## 2015-11-06 MED ORDER — LIDOCAINE HCL (PF) 1 % IJ SOLN
INTRAMUSCULAR | Status: DC | PRN
  Administered 2015-11-06: 2 mL

## 2015-11-06 MED ORDER — HEPARIN (PORCINE) IN NACL 2-0.9 UNIT/ML-% IJ SOLN
INTRAMUSCULAR | Status: AC
  Filled 2015-11-06: qty 1000

## 2015-11-06 MED ORDER — HEPARIN (PORCINE) IN NACL 2-0.9 UNIT/ML-% IJ SOLN
INTRAMUSCULAR | Status: DC | PRN
  Administered 2015-11-06: 1000 mL

## 2015-11-06 MED ORDER — SODIUM CHLORIDE 0.9 % IV SOLN
INTRAVENOUS | Status: DC
  Administered 2015-11-06: 06:00:00 via INTRAVENOUS

## 2015-11-06 SURGICAL SUPPLY — 8 items
CATH BALLN WEDGE 5F 110CM (CATHETERS) ×1 IMPLANT
GUIDEWIRE .025 260CM (WIRE) ×1 IMPLANT
PACK CARDIAC CATHETERIZATION (CUSTOM PROCEDURE TRAY) ×2 IMPLANT
PROTECTION STATION PRESSURIZED (MISCELLANEOUS) ×2
SHEATH FAST CATH BRACH 5F 5CM (SHEATH) ×1 IMPLANT
STATION PROTECTION PRESSURIZED (MISCELLANEOUS) IMPLANT
TRANSDUCER W/STOPCOCK (MISCELLANEOUS) ×3 IMPLANT
WIRE EMERALD 3MM-J .025X260CM (WIRE) ×1 IMPLANT

## 2015-11-06 NOTE — Progress Notes (Signed)
HeartMate II System Controller SW Version 7.29 upgrade performed in clinic today to accommodate new recommendations from [employer]. Consent reviewed and signed. Tem Tech representative Scharlene Corn) performed software upgrade.   Patient performed elective controller exchange under VAD Coordinator supervision. Patient tolerated controller exchange well and was asymptomatic. Pump restarted as expected with stable VAD parameters on correct prescribed speed of 9400/8800 rpms.    Primary: PC - [id number]  Secondary: PC - [id number]  Primary controller back up battery has 21 m until replacement of 11V battery.  Secondary controller back up battery has 20 m until replacement of 11V battery.

## 2015-11-06 NOTE — Discharge Instructions (Signed)
Venogram, Care After °Refer to this sheet in the next few weeks. These instructions provide you with information on caring for yourself after your procedure. Your health care provider may also give you more specific instructions. Your treatment has been planned according to current medical practices, but problems sometimes occur. Call your health care provider if you have any problems or questions after your procedure. °WHAT TO EXPECT AFTER THE PROCEDURE °After your procedure, it is typical to have the following sensations: °· Mild discomfort at the catheter insertion site. °HOME CARE INSTRUCTIONS  °· Take all medicines exactly as directed. °· Follow any prescribed diet. °· Follow instructions regarding both rest and physical activity. °· Drink more fluids for the first several days after the procedure in order to help flush dye from your kidneys. °SEEK MEDICAL CARE IF: °· You develop a rash. °· You have fever not controlled by medicine. °SEEK IMMEDIATE MEDICAL CARE IF: °· There is pain, drainage, bleeding, redness, swelling, warmth or a red streak at the site of the IV tube. °· The extremity where your IV tube was placed becomes discolored, numb, or cool. °· You have difficulty breathing or shortness of breath. °· You develop chest pain. °· You have excessive dizziness or fainting. °  °This information is not intended to replace advice given to you by your health care provider. Make sure you discuss any questions you have with your health care provider. °  °Document Released: 01/10/2013 Document Revised: 03/27/2013 Document Reviewed: 01/10/2013 °Elsevier Interactive Patient Education ©2016 [employer] ° °

## 2015-11-06 NOTE — Interval H&P Note (Signed)
History and Physical Interval Note:  11/06/2015 7:44 AM  Robert [name]  has presented today for surgery, with the diagnosis of CHF  The various methods of treatment have been discussed with the patient and family. After consideration of risks, benefits and other options for treatment, the patient has consented to  Procedure(s): Right Heart Cath (N/A) as a surgical intervention as part of transplant work-up .  The patient's history has been reviewed, patient examined, no change in status, stable for surgery.  I have reviewed the patient's chart and labs.  Questions were answered to the patient's satisfaction.     Deicy Rusk, Reuel Boom

## 2015-11-06 NOTE — Progress Notes (Signed)
Site area: Geophysical data processor Prior to Removal:  Level 0 Pressure Applied For: 10 minutes Manual:   yes Patient Status During Pull:  stable Post Pull Site:  Level  0 Post Pull Instructions Given:  yes Post Pull Pulses Present: yes Dressing Applied:  Small tegaderm Bedrest begins @  0855 Comments:

## 2015-11-06 NOTE — H&P (View-Only) (Signed)
Patient ID: Robert [name], male   DOB: Dec 23, 1955, 60 y.o.   MRN: [id number] Symptom  Yes  No  Details   Angina       x Activity:   Claudication       x How far:   Syncope       x When:  Stroke       x   Orthopnea       x How many pillows:  1 pillow  PND       x How often:  CPAP       N/A How many hrs:   Pedal edema       x   Abd fullness       x   N&V       x good appetite  Diaphoresis       x When:  Bleeding      x   Urine color    light yellow  SOB       x Activity:   Palpitations        x      When: less frequent; "beats hard" somestimes  ICD shock       x   Hospitlizaitons       x When/where/why:  ED visit       x When/where/why:  Other MD       x When/who/why:    Activity            decreased due to palpitations  Fluid    No limitations  Diet    No limitations   Vital signs: HR: 75 MAP BP: 100 Auto cuff: 110/79 (89) O2 Sat: 97% Wt: 252.8 lbs Last wt: 254  lbs Ht:  6'2"  LVAD interrogation reveals:  Speed:  9400 Flow: 4.3 Power: 5.3w PI: 7.1 Alarms:  none Events: 2 - 4 daily  Fixed speed:  9400 Low speed limit:  8800  11V battery status: Primary controller: replace 23 months              Secondary controller: replace 10 months   LVAD exit site:  Well healed and incorporated. The velour is fully implanted at exit site. Dressing dry and intact. No erythema, slight amount of light bloody drainage. Stabilization device present and accurately applied. Driveline dressing is being changed q 3 - 5 days per sterile technique using Sorbaview dressing with biopatch on exit site. Pt does self dressing changes. Pt denies fever or chills.  Pt denies any alarms or VAD equipment issues. Pt is completing weekly and monthly maintenance for LVAD equipment. LVAD equipment check completed and is in good working order. Back-up equipment present. LVAD education done on emergency procedures and precautions and reviewed exit site care.  Encounter Details: Pt stated that his driveline  got wet a couple of days ago, there was a slight amount of drainage per pt. See above note for driveline details. Wound culture sent. Pt had 40+ PI events on 6/25 and only 2-8 on other days.    MAP 92 today.  Medtronic device has reached RRT 02/21/15--> has an appointment with EP doc today to discuss replacing. With transplant status and LVAD will not replace  Pt supplied with 10 daily dressings and 10 weekly dressings. Pt instructed that if exit site appears to be moist or if pt is outside a lot that he should use a daily dressing kit opposed to a weekly kit.   Pt instructions: 1. Start Cipro 500 mg  BID for 14 days. 2. Return to clinic for an INR check on 7/3. 3. Return to clinic for visit on 7/25. 4. Please call if drainage increases or there is any redness, swelling or odor from driveline site.   Tanda Rockers, RN VAD Coordinator   Office: [phone] 24/7 VAD Pager: [phone]   LVAD CLINIC NOTE  Patient ID: Robert [name], male   DOB: 01-01-1956, 60 y.o.   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 status post dual chamber Medtronic ICD implanted April 2011. Cath 2011 showed normal coronaries. He also has a history of VTach . Currently listed as 1b list for heart tx at [facility].  Dual listed at [facility] and [facility].      Had [place] on 11/04/14 as part of dual listing process at [facility]. Normal filling pressures and cardiac output. Amio stopped due to thyrotoxicity. Placed on prednisone and tapazole. Had VT off amio and mexilitene started.  He returns for LVAD follow up. Overall feeling ok. Denies SOB/PND/Orthopnea. Has been able to mow his lawn. Missed his appointment with Dr Forde Dandy. Taking all medications.No fever or chills. He is changing his own driveline dressing. No drainage around the driveline. Reports taking Coumadin as prescribed and  adherence to anticoagulation based dietary restrictions.  Denies bright red blood per rectum or melena, no dark urine or hematuria.     LVAD interrogation reveals:  See LVAD nurse's note above.  I reviewed the LVAD parameters from today, and compared the results to the patient's prior recorded data.  No programming changes were made.  The LVAD is functioning within specified parameters.  The patient performs LVAD self-test daily.  LVAD interrogation was negative for any significant power changes, alarms or PI events/speed drops.  LVAD equipment check completed and is in good working order.  Back-up equipment present.   LVAD education done on emergency procedures and precautions and reviewed exit site care.    Past Medical History:  Diagnosis Date  . AICD (automatic cardioverter/defibrillator) present 2011  . Bronchitis   . Congestive heart failure ([place]) 2011  . Hypertension 2000  . Pneumonia   . Pulmonary hypertension ([place])     Current Outpatient Prescriptions  Medication Sig Dispense Refill  . allopurinol (ZYLOPRIM) 300 MG tablet Take 1 tablet (300 mg total) by mouth daily. 30 tablet 6  . amLODipine (NORVASC) 10 MG tablet Take 1 tablet (10 mg total) by mouth daily. 90 tablet 3  . aspirin EC 325 MG tablet Take 1 tablet (325 mg total) by mouth daily. 30 tablet 6  . doxazosin (CARDURA) 8 MG tablet Take 1 tablet (8 mg total) by mouth daily. 30 tablet 3  . hydrALAZINE (APRESOLINE) 100 MG tablet Take 0.5 tablets (50 mg total) by mouth 2 (two) times daily. 30 tablet 6  . mexiletine (MEXITIL) 150 MG capsule Take 1 capsule (150 mg total) by mouth 2 (two) times daily. 60 capsule 11  . pantoprazole (PROTONIX) 40 MG tablet TAKE 1 TABLET BY MOUTH EVERY DAY 90 tablet 3  . sacubitril-valsartan (ENTRESTO) 97-103 MG Take 1 tablet by mouth 2 (two) times daily. 60 tablet 6  . traMADol (ULTRAM) 50 MG tablet Take 1 tablet (50 mg total) by mouth every 8 (eight) hours as needed for moderate pain. 30 tablet 0    . warfarin (COUMADIN)  5 MG tablet Take 5 mg by mouth daily. Take as directed for INR goal 2.0 - 3.0    . warfarin (COUMADIN) 5 MG tablet TAKE AS DIRECTED BY VAD CLINIC 60 tablet 6  . ciprofloxacin (CIPRO) 500 MG tablet Take 1 tablet (500 mg total) by mouth 2 (two) times daily. (Patient not taking: Reported on 10/28/2015) 14 tablet 0  . ciprofloxacin (CIPRO) 500 MG tablet Take 1 tablet (500 mg total) by mouth 2 (two) times daily. Take twice daily for 14 days (Patient not taking: Reported on 10/28/2015) 28 tablet 0  . doxazosin (CARDURA) 4 MG tablet TAKE ONE (1) TABLET EACH DAY (Patient not taking: Reported on 10/28/2015) 90 tablet 3  . doxycycline (VIBRAMYCIN) 50 MG capsule Take 2 capsules (100 mg total) by mouth 2 (two) times daily. (Patient not taking: Reported on 10/28/2015) 28 capsule 0  . HYDROcodone-acetaminophen (NORCO/VICODIN) 5-325 MG tablet Take 1 tablet by mouth every 4 (four) hours as needed. (Patient not taking: Reported on 10/28/2015) 16 tablet 0  . methimazole (TAPAZOLE) 10 MG tablet Take 2 tablets (20 mg total) by mouth 2 (two) times daily. (Patient not taking: Reported on 10/28/2015) 60 tablet 1  . predniSONE (DELTASONE) 20 MG tablet Take 2 tablets (40 mg total) by mouth daily. (Patient not taking: Reported on 10/28/2015) 6 tablet 0   No current facility-administered medications for this encounter.     Imdur [isosorbide nitrate] and Metoprolol  REVIEW OF SYSTEMS: All systems negative except as listed in HPI, PMH and Problem list.  Vital signs: HR: 71 MAP BP: 110 Auto cuff: 113/61 (91) O2 Sat: 99% Wt: 255.6 lbs Last wt: 252.8  lbs Ht:  6'2"  Physical Exam: GENERAL: male who presents to clinic today in no acute distress. Ambulated in the clinic without difficulty.  HEENT: normal  NECK: Supple, JVP flat;  2+ bilaterally, no bruits.  No lymphadenopathy or thyromegaly appreciated.   CARDIAC:  Mechanical heart sounds with LVAD hum present.  LUNGS:  Clear ABDOMEN:  Soft, round,  nontender, positive bowel sounds x4.     LVAD exit site: No erythema or exudate around drive line. Stabilization device present and accurately applied.  Driveline itself with multiple tears (covered with tape) and twists EXTREMITIES:  Warm and dry, no cyanosis, clubbing, rash or edema. MAE   NEUROLOGIC:  Alert and oriented x 4.  Gait steady.  No aphasia.  No dysarthria.  Affect pleasant.     Recent Results (from the past 2160 hour(s))  Protime-INR     Status: Abnormal   Collection Time: 08/04/15  3:10 PM  Result Value Ref Range   Prothrombin Time 24.6 (H) 11.6 - 15.2 seconds   INR 2.25 (H) 0.00 - 2.03  Basic metabolic panel     Status: Abnormal   Collection Time: 08/26/15 12:48 PM  Result Value Ref Range   Sodium 138 135 - 145 mmol/L   Potassium 3.9 3.5 - 5.1 mmol/L   Chloride 109 101 - 111 mmol/L   CO2 26 22 - 32 mmol/L   Glucose, Bld 93 65 - 99 mg/dL   BUN 9 6 - 20 mg/dL   Creatinine, Ser 0.99 0.61 - 1.24 mg/dL   Calcium 8.6 (L) 8.9 - 10.3 mg/dL   GFR calc non Af Amer >60 >60 mL/min   GFR calc Af Amer >60 >60 mL/min    Comment: (NOTE) The eGFR has been calculated using the CKD EPI equation. This calculation has not been validated in all clinical situations. eGFR's  persistently <60 mL/min signify possible Chronic Kidney Disease.    Anion gap 3 (L) 5 - 15  CBC     Status: None   Collection Time: 08/26/15 12:48 PM  Result Value Ref Range   WBC 6.4 4.0 - 10.5 K/uL   RBC 4.68 4.22 - 5.81 MIL/uL   Hemoglobin 13.2 13.0 - 17.0 g/dL   HCT 40.6 39.0 - 52.0 %   MCV 86.8 78.0 - 100.0 fL   MCH 28.2 26.0 - 34.0 pg   MCHC 32.5 30.0 - 36.0 g/dL   RDW 13.6 11.5 - 15.5 %   Platelets 150 150 - 400 K/uL  Protime-INR     Status: Abnormal   Collection Time: 08/26/15 12:48 PM  Result Value Ref Range   Prothrombin Time 23.6 (H) 11.6 - 15.2 seconds   INR 2.12 (H) 0.00 - 1.49  Lactate dehydrogenase     Status: Abnormal   Collection Time: 08/26/15 12:48 PM  Result Value Ref Range   LDH 255  (H) 98 - 192 U/L  TSH     Status: None   Collection Time: 08/26/15 12:48 PM  Result Value Ref Range   TSH 2.303 0.350 - 4.500 uIU/mL  Aerobic Culture (superficial specimen) (NOT AT [facility])     Status: None   Collection Time: 08/26/15  2:49 PM  Result Value Ref Range   Specimen Description WOUND    Special Requests LVAD DRIVELINE    Gram Stain      NO WBC SEEN NO SQUAMOUS EPITHELIAL CELLS SEEN NO ORGANISMS SEEN    Culture      RARE SERRATIA MARCESCENS CRITICAL RESULT CALLED TO, READ BACK BY AND VERIFIED WITH: D WOOD 08/27/15 @ 42 M VESTAL    Report Status 08/28/2015 FINAL    Organism ID, Bacteria SERRATIA MARCESCENS       Susceptibility   Serratia marcescens - MIC*    CEFAZOLIN >=64 RESISTANT Resistant     CEFEPIME <=1 SENSITIVE Sensitive     CEFTAZIDIME <=1 SENSITIVE Sensitive     CEFTRIAXONE <=1 SENSITIVE Sensitive     CIPROFLOXACIN <=0.25 SENSITIVE Sensitive     GENTAMICIN <=1 SENSITIVE Sensitive     TRIMETH/SULFA <=20 SENSITIVE Sensitive     * RARE SERRATIA MARCESCENS  Protime-INR     Status: Abnormal   Collection Time: 09/03/15 12:37 PM  Result Value Ref Range   Prothrombin Time 23.7 (H) 11.6 - 15.2 seconds   INR 2.14 (H) 0.00 - 1.49  Protime-INR     Status: Abnormal   Collection Time: 09/19/15 11:00 AM  Result Value Ref Range   Prothrombin Time 25.3 (H) 11.6 - 15.2 seconds   INR 2.33 (H) 0.00 - 1.49  ABO/Rh     Status: None   Collection Time: 09/29/15 11:41 AM  Result Value Ref Range   ABO/RH(D) O POS   Hepatic function panel     Status: Abnormal   Collection Time: 09/29/15 12:00 PM  Result Value Ref Range   Total Protein 6.7 6.5 - 8.1 g/dL   Albumin 3.7 3.5 - 5.0 g/dL   AST 18 15 - 41 U/L   ALT 14 (L) 17 - 63 U/L   Alkaline Phosphatase 78 38 - 126 U/L   Total Bilirubin 0.8 0.3 - 1.2 mg/dL   Bilirubin, Direct <0.1 (L) 0.1 - 0.5 mg/dL   Indirect Bilirubin NOT CALCULATED 0.3 - 0.9 mg/dL  Rapid HIV screen (HIV 1/2 Ab+Ag)     Status: None   Collection Time:  09/29/15 12:00 PM  Result Value Ref Range   HIV-1 P24 Antigen - HIV24 NON REACTIVE NON REACTIVE   HIV 1/2 Antibodies NON REACTIVE NON REACTIVE   Interpretation (HIV Ag Ab)      A non reactive test result means that HIV 1 or HIV 2 antibodies and HIV 1 p24 antigen were not detected in the specimen.  HIV-1 DNA qualitative by PCR, blood     Status: None   Collection Time: 09/29/15 12:00 PM  Result Value Ref Range   HIV-1 RNA, Qualitative, TMA Negative Negative    Comment: (NOTE) Negative for HIV-1 RNA Performed At: [facility] [place], [place] [id number] Lindon Romp MD JO:[id number]   Cmv antibody, IgG (EIA)     Status: Abnormal   Collection Time: 09/29/15 12:00 PM  Result Value Ref Range   CMV Ab - IgG 3.60 (H) 0.00 - 0.59 U/mL    Comment: (NOTE)                               Negative          <0.60                               Equivocal   0.60 - 0.69                               Positive          >0.69 Performed At: [facility] [place], [place] [id number] Lindon Romp MD OQ:[id number]   CMV IgM     Status: None   Collection Time: 09/29/15 12:00 PM  Result Value Ref Range   CMV IgM <30.0 0.0 - 29.9 AU/mL    Comment: (NOTE)                                Negative         <30.0                                Equivocal  30.0 - 34.9                                Positive         >34.9 A positive result is generally indicative of acute infection, reactivation or persistent IgM production. Performed At: [facility] [address] [place], [place] [id number] Lindon Romp MD NT:[id number]   Varicella zoster antibody, IgG     Status: None   Collection Time: 09/29/15 12:00 PM  Result Value Ref Range   Varicella IgG 2,535 Immune >165 index    Comment: (NOTE)                               Negative          <135                               Equivocal    135 - 165  Positive           >165 A positive result generally indicates exposure to the pathogen or administration of specific immunoglobulins, but it is not indication of active infection or stage of disease. Performed At: [facility] [place], [place] [id number] Lindon Romp MD ST:[id number]   HSV 1 antibody, IgG     Status: Abnormal   Collection Time: 09/29/15 12:00 PM  Result Value Ref Range   HSV 1 Glycoprotein G Ab, IgG 33.90 (H) 0.00 - 0.90 index    Comment: (NOTE)                                 Negative        <0.91                                 Equivocal 0.91 - 1.09                                 Positive        >1.09 Note: Negative indicates no antibodies detected to HSV-1. Equivocal may suggest early infection.  If clinically appropriate, retest at later date. Positive indicates antibodies detected to HSV-1. Performed At: [facility] [place], [place] [id number] Lindon Romp MD EY:[id number]   Hepatitis B surface antibody     Status: Abnormal   Collection Time: 09/29/15 12:00 PM  Result Value Ref Range   Hepatitis B-Post <3.1 (L) Immunity>9.9 mIU/mL    Comment: (NOTE)  Status of Immunity                     Anti-HBs Level  ------------------                     -------------- Inconsistent with Immunity                   0.0 - 9.9 Consistent with Immunity                          >9.9 Performed At: [facility] [place], [place] [id number] Lindon Romp MD CH:[id number]   Hepatitis B surface antigen     Status: None   Collection Time: 09/29/15 12:00 PM  Result Value Ref Range   Hepatitis B Surface Ag Negative Negative    Comment: (NOTE) Performed At: [facility] [address] [place], [place] [id number] Lindon Romp MD NO:[id number]   Hepatitis B core antibody, total     Status: None   Collection Time: 09/29/15 12:00 PM  Result Value Ref Range   Hep B Core Total Ab Negative Negative     Comment: (NOTE) Performed At: [facility] [address] [place], [place] [id number] Lindon Romp MD PT:[id number]   Hepatitis C Antibody     Status: None   Collection Time: 09/29/15 12:00 PM  Result Value Ref Range   HCV Ab <0.1 0.0 - 0.9 s/co ratio    Comment: (NOTE)                                  Negative:     < 0.8  Indeterminate: 0.8 - 0.9                                  Positive:     > 0.9 The CDC recommends that a positive HCV antibody result be followed up with a HCV Nucleic Acid Amplification test (703500). Performed At: [facility] [place], [place] [id number] Lindon Romp MD ZJ:[id number]   Epstein-Barr Virus VCA Antibody Panel     Status: Abnormal   Collection Time: 09/29/15 12:00 PM  Result Value Ref Range   EBV VCA IgG <18.0 0.0 - 17.9 U/mL    Comment: (NOTE)                                 Negative        <18.0                                 Equivocal 18.0 - 21.9                                 Positive        >21.9    EBV VCA IgM <36.0 0.0 - 35.9 U/mL    Comment: (NOTE)                                 Negative        <36.0                                 Equivocal 36.0 - 43.9                                 Positive        >43.9    EBV NA IgG >600.0 (H) 0.0 - 17.9 U/mL    Comment: (NOTE)                                 Negative        <18.0                                 Equivocal 18.0 - 21.9                                 Positive        >21.9    EBV Early Antigen Ab, IgG <9.0 0.0 - 8.9 U/mL    Comment: (NOTE)                                 Negative        < 9.0                                 Equivocal  9.0 - 10.9                                 Positive        >10.9    Interpretation: Comment     Comment: (NOTE)               EBV Interpretation Chart Interpretation   EBV-IgM  EA(D)-IgG  VCA-IgG  EBNA-IgG EBV Seronegative    -        -         -          - Early Phase          +        -         -          - Acute Primary       +       +or-       +          - Infection Convalescence/Past  -       +or-       +          + Infection Reactivated        +or-      +         +          + Infection       + Antibody Present      - Antibody Absent Performed At: [facility] [address] [place], [place] [id number] Lindon Romp MD [id number]   Toxoplasma gondii antibody, IgG     Status: None   Collection Time: 09/29/15 12:00 PM  Result Value Ref Range   Toxoplasma IgG Ratio <3.0 0.0 - 7.1 IU/mL    Comment: (NOTE)                                 Negative        <7.2                                 Equivocal  7.2 - 8.7                                 Positive        >8.7 Performed At: [facility] [address] [place], [place] [id number] Lindon Romp MD XN:[id number]   Toxoplasma gondii antibody, IgM     Status: None   Collection Time: 09/29/15 12:00 PM  Result Value Ref Range   Toxoplasma Antibody- IgM <3.0 0.0 - 7.9 AU/mL    Comment: (NOTE)                             Negative            <8.0                             Equivocal      8.0 - 9.9  Positive            >9.9 Performed At: [facility] [place], [place] [id number] Lindon Romp MD [id number]   Nicotine/cotinine metabolites     Status: None   Collection Time: 09/29/15 12:00 PM  Result Value Ref Range   Nicotine None Detected ng/mL    Comment: (NOTE) Nicotine levels greater than 2.0 are consistent with the use of tobacco or tobacco cessation products.    Cotinine None Detected ng/mL    Comment: (NOTE) Cotinine levels greater than 20.0 are consistent with the use of tobacco or tobacco cessation products. Performed At: [facility] [place], [place] [id number] Lindon Romp MD BM:[id number]   TSH     Status: None   Collection Time: 09/29/15 12:00 PM  Result Value Ref Range   TSH  2.253 0.350 - 4.500 uIU/mL  T4, free     Status: None   Collection Time: 09/29/15 12:00 PM  Result Value Ref Range   Free T4 0.88 0.61 - 1.12 ng/dL    Comment: (NOTE) Biotin ingestion may interfere with free T4 tests. If the results are inconsistent with the TSH level, previous test results, or the clinical presentation, then consider biotin interference. If needed, order repeat testing after stopping biotin.   T3, free     Status: None   Collection Time: 09/29/15 12:00 PM  Result Value Ref Range   T3, Free 2.2 2.0 - 4.4 pg/mL    Comment: (NOTE) Performed At: [facility] [address] [place], [place] [id number] Lindon Romp MD IH:[id number]   Comment     Status: None   Collection Time: 09/29/15 12:00 PM  Result Value Ref Range   Infect disease Ab comment 1 Comment     Comment: (NOTE) It is presumed the patient has not been infected with and is not undergoing an acute infection with Toxoplasma. If symptoms persist, submit a new specimen after three weeks. Performed At: [facility] [place], [place] [id number] Lindon Romp MD JO:[id number]   Urine rapid drug screen (hosp performed)     Status: None   Collection Time: 09/29/15 12:05 PM  Result Value Ref Range   Opiates NONE DETECTED NONE DETECTED   Cocaine NONE DETECTED NONE DETECTED   Benzodiazepines NONE DETECTED NONE DETECTED   Amphetamines NONE DETECTED NONE DETECTED   Tetrahydrocannabinol NONE DETECTED NONE DETECTED   Barbiturates NONE DETECTED NONE DETECTED    Comment:        DRUG SCREEN FOR MEDICAL PURPOSES ONLY.  IF CONFIRMATION IS NEEDED FOR ANY PURPOSE, NOTIFY LAB WITHIN 5 DAYS.        LOWEST DETECTABLE LIMITS FOR URINE DRUG SCREEN Drug Class       Cutoff (ng/mL) Amphetamine      1000 Barbiturate      200 Benzodiazepine   601 Tricyclics       093 Opiates          300 Cocaine          300 THC              50   CBC     Status: Abnormal   Collection Time:  09/29/15  1:10 PM  Result Value Ref Range   WBC 6.5 4.0 - 10.5 K/uL   RBC 4.86 4.22 - 5.81 MIL/uL   Hemoglobin 14.0 13.0 - 17.0 g/dL   HCT 42.3 39.0 - 52.0 %   MCV 87.0 78.0 -  100.0 fL   MCH 28.8 26.0 - 34.0 pg   MCHC 33.1 30.0 - 36.0 g/dL   RDW 14.2 11.5 - 15.5 %   Platelets 148 (L) 150 - 400 K/uL  Aerobic Culture (superficial specimen)     Status: None   Collection Time: 09/29/15  1:17 PM  Result Value Ref Range   Specimen Description WOUND    Special Requests DRIVE LINE EXIT SITE    Gram Stain      RARE WBC PRESENT,BOTH PMN AND MONONUCLEAR FEW GRAM VARIABLE ROD RARE GRAM POSITIVE COCCI IN PAIRS    Culture ABUNDANT SERRATIA MARCESCENS    Report Status 10/01/2015 FINAL    Organism ID, Bacteria SERRATIA MARCESCENS       Susceptibility   Serratia marcescens - MIC*    CEFAZOLIN >=64 RESISTANT Resistant     CEFEPIME <=1 SENSITIVE Sensitive     CEFTAZIDIME <=1 SENSITIVE Sensitive     CEFTRIAXONE <=1 SENSITIVE Sensitive     CIPROFLOXACIN 1 SENSITIVE Sensitive     GENTAMICIN <=1 SENSITIVE Sensitive     TRIMETH/SULFA <=20 SENSITIVE Sensitive     * ABUNDANT SERRATIA MARCESCENS  Protime-INR     Status: Abnormal   Collection Time: 10/06/15 11:53 AM  Result Value Ref Range   Prothrombin Time 32.8 (H) 11.6 - 15.2 seconds   INR 3.30 (H) 0.00 - 1.49  Protime-INR     Status: Abnormal   Collection Time: 10/20/15 12:30 PM  Result Value Ref Range   Prothrombin Time 22.8 (H) 11.6 - 15.2 seconds   INR 2.03 (H) 0.00 - 1.49  RPR     Status: None   Collection Time: 10/20/15 12:30 PM  Result Value Ref Range   RPR Ser Ql Non Reactive Non Reactive    Comment: (NOTE) Performed At: [facility] [place], [place] [id number] Lindon Romp MD HX:[id number]   Ethanol     Status: None   Collection Time: 10/20/15 12:30 PM  Result Value Ref Range   Alcohol, Ethyl (B) <5 <5 mg/dL    Comment:        LOWEST DETECTABLE LIMIT FOR SERUM ALCOHOL IS 5 mg/dL FOR MEDICAL  PURPOSES ONLY   Pulmonary function test     Status: None (Preliminary result)   Collection Time: 10/28/15  9:59 AM  Result Value Ref Range   FVC-Pre 3.26 L   FVC-%Pred-Pre 75 %   FVC-Post 3.34 L   FVC-%Pred-Post 77 %   FVC-%Change-Post 2 %   FEV1-Pre 2.60 L   FEV1-%Pred-Pre 77 %   FEV1-Post 2.80 L   FEV1-%Pred-Post 83 %   FEV1-%Change-Post 8 %   FEV6-Pre 3.25 L   FEV6-%Pred-Pre 78 %   FEV6-Post 3.34 L   FEV6-%Pred-Post 80 %   FEV6-%Change-Post 2 %   Pre FEV1/FVC ratio 80 %   FEV1FVC-%Pred-Pre 102 %   Post FEV1/FVC ratio 84 %   FEV1FVC-%Change-Post 5 %   Pre FEV6/FVC Ratio 100 %   FEV6FVC-%Pred-Pre 102 %   Post FEV6/FVC ratio 100 %   FEV6FVC-%Pred-Post 103 %   FEV6FVC-%Change-Post 0 %   FEF 25-75 Pre 2.69 L/sec   FEF2575-%Pred-Pre 86 %   FEF 25-75 Post 3.56 L/sec   FEF2575-%Pred-Post 114 %   FEF2575-%Change-Post 32 %   RV 1.74 L   RV % pred 73 %   TLC 6.46 L   TLC % pred 87 %   DLCO unc 18.64 ml/min/mmHg   DLCO unc % pred 53 %  DL/VA 3.69 ml/min/mmHg/L   DL/VA % pred 77 %  Basic metabolic panel     Status: None   Collection Time: 10/28/15 12:15 PM  Result Value Ref Range   Sodium 139 135 - 145 mmol/L   Potassium 3.7 3.5 - 5.1 mmol/L   Chloride 105 101 - 111 mmol/L   CO2 28 22 - 32 mmol/L   Glucose, Bld 91 65 - 99 mg/dL   BUN 11 6 - 20 mg/dL   Creatinine, Ser 1.11 0.61 - 1.24 mg/dL   Calcium 9.1 8.9 - 10.3 mg/dL   GFR calc non Af Amer >60 >60 mL/min   GFR calc Af Amer >60 >60 mL/min    Comment: (NOTE) The eGFR has been calculated using the CKD EPI equation. This calculation has not been validated in all clinical situations. eGFR's persistently <60 mL/min signify possible Chronic Kidney Disease.    Anion gap 6 5 - 15  CBC     Status: Abnormal   Collection Time: 10/28/15 12:15 PM  Result Value Ref Range   WBC 8.4 4.0 - 10.5 K/uL   RBC 4.86 4.22 - 5.81 MIL/uL   Hemoglobin 13.7 13.0 - 17.0 g/dL   HCT 42.4 39.0 - 52.0 %   MCV 87.2 78.0 - 100.0 fL   MCH  28.2 26.0 - 34.0 pg   MCHC 32.3 30.0 - 36.0 g/dL   RDW 14.6 11.5 - 15.5 %   Platelets 147 (L) 150 - 400 K/uL  Protime-INR     Status: Abnormal   Collection Time: 10/28/15 12:15 PM  Result Value Ref Range   Prothrombin Time 24.4 (H) 11.6 - 15.2 seconds   INR 2.22 (H) 0.00 - 1.49  Lactate dehydrogenase     Status: Abnormal   Collection Time: 10/28/15 12:15 PM  Result Value Ref Range   LDH 267 (H) 98 - 192 U/L    ASSESSMENT AND PLAN:   1)  HTN: MAP ok. Continue Entresto 97/103 bid.  2) Chronic systolic HF, s/p HMII LVAD implant 05/2012. NYHA II.  Volume status stable.  - Continue entresto 97-103 twice a day.   - Now dual listed at [facility] and [facility].  - He is not on a BB. He was able to tolerate low-doseToprol before LVAD, however has been intolerant since that time  - Reinforced the need and importance of daily weights, a low sodium diet, and fluid restriction (less than 2 L a day). Instructed to call the HF clinic if weight increases more than 3 lbs overnight or 5 lbs in a week.  3) Palpitations: Resolved. ICD now at EOL. Will not replace. Due to risk of infection, risk felt to be higher than benefit of replacing ICD as VT usually well tolerated in setting of VAD support 4) Hyperthyroidism: Amio-induced. Needs follow up with Dr. Forde Dandy. Currently off tapazole and prednisone.  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 6 Anticoagulation with coumadin: Continue coumadin and ASA 325 mg for LVAD. Todays INR 2.22 7) LVAD: Stable VAD parameters. Driveline site intact. Recently completed Cipro 500 mg bid x 14 days.  See VAD coordinator below.    Todays labs reviewed. CBC, BMET, INR stable. Had ECHO earlier today as part of transplant work up. Plan to [place] next week. Follow up in 2 months.   Amy Clegg NP-C  10/28/2015   Details      Angina       x Activity:   Claudication       x How far:  Syncope       x When:  Stroke       x   Orthopnea       x How many pillows:  1 pillow  PND        x How often:  CPAP       N/A How many hrs:   Pedal edema       x   Abd fullness       x   N&V       x good appetite  Diaphoresis       x When:  Bleeding      x   Urine color    light yellow  SOB       x Activity:   Palpitations        x      When: less frequent; "beats hard" somestimes  ICD shock       x   Hospitlizaitons       x When/where/why:  ED visit       x When/where/why:  Other MD       x When/who/why:    Activity            decreased due to palpitations  Fluid    No limitations  Diet    No limitations   Vital signs: HR: 71 MAP BP: 110 Auto cuff: 113/61 (91) O2 Sat: 99% Wt: 255.6 lbs Last wt: 252.8  lbs Ht:  6'2"  LVAD interrogation reveals:  Speed:  9400 Flow: 5.3 Power: 5.9w PI: 6.7 Alarms:  none Events: 8-10 daily; outlier on  7/14-22+ PI events- Pt states he may have been mowing the yard  Fixed speed:  9400 Low speed limit:  8800  11V battery status: Primary controller: replace 21 months                                                        Secondary controller: replace 10 months   LVAD exit site:  Well healed and incorporated. The velour is fully implanted at exit site. Dressing dry and intact. No erythema, slight amount of serous drainage. Stabilization device present and accurately applied. Driveline dressing is being changed daily per sterile technique using split  gauze dressing. Pt does self dressing changes. Pt denies fever or chills.  Pt denies any alarms or VAD equipment issues. Pt is requesting the aquaseal strips again for his driveline site. Pt was instructed to do daily dressing changes with aquaseal strip. Pt instructed to notify us if he has increase drainage, redness, tenderness or odor from the site.   Encounter Details: MAP 91 today.  Pt supplied with 14 daily dressings and 5 anchors.   Pt instructions: 1. Return to clinic for an INR check in 2 weeks. 2. Return to clinic for visit in 2 months. 3. Please call  if drainage increases or there is any redness, swelling or odor from driveline site.   Tanda Rockers, RN VAD Coordinator   Office: [phone] 24/7 VAD Pager: [phone]

## 2015-11-06 NOTE — Progress Notes (Signed)
VAD coordinator met patient in cath lab with VAD cart.   VAD Coordinator Procedure Note:   Patient underwent Right Heart Cath per Dr. Vaughan Browner.  Hemodynamics and VAD parameters monitored by me throughout the procedure. MAPs were obtained with automatic BP cuff and correlated with Doppler modified systolic pressure.  No sedation given per patient's request.      Doppler Auto cuff(MAP):   Flow: PI: Power:     Speed:               Pre-procedure: 7:45 am 126  116/88 (97)  4.9 7.3 5.7       9400  Intra-procedure: 8:00    112/88 (80)  5.2 6.8 5.6 8:10    109/86 (78)  5.2 7.0 5.9  Post procedure: 8:30    115/87 (99)  5.6 6.9 6.4        Patient Disposition: Cath lab holding

## 2015-11-07 ENCOUNTER — Encounter: Payer: Self-pay | Admitting: Infectious Diseases

## 2015-11-07 NOTE — Progress Notes (Signed)
Results of recent lab work, RHC, PFTs, EKG, & CXR sent via secure e-mail to [facility] Transplant coordinator at [facility] per Medicaid requirements for listing.   Rexene Alberts, RN VAD Coordinator   Office: [phone] 24/7 Emergency VAD Pager: [phone]

## 2015-11-10 ENCOUNTER — Other Ambulatory Visit (HOSPITAL_COMMUNITY): Payer: Medicaid Other

## 2015-11-20 ENCOUNTER — Other Ambulatory Visit (HOSPITAL_COMMUNITY): Payer: Self-pay | Admitting: *Deleted

## 2015-11-20 ENCOUNTER — Ambulatory Visit (HOSPITAL_COMMUNITY)
Admission: RE | Admit: 2015-11-20 | Discharge: 2015-11-20 | Disposition: A | Discharge status: Home / Self Care | Payer: Medicaid Other | Source: Ambulatory Visit | Attending: Internal Medicine | Admitting: Internal Medicine

## 2015-11-20 DIAGNOSIS — B999 Unspecified infectious disease: Secondary | ICD-10-CM

## 2015-11-20 DIAGNOSIS — Z95811 Presence of heart assist device: Secondary | ICD-10-CM

## 2015-11-20 DIAGNOSIS — Z4800 Encounter for change or removal of nonsurgical wound dressing: Secondary | ICD-10-CM | POA: Insufficient documentation

## 2015-11-20 DIAGNOSIS — T829XXD Unspecified complication of cardiac and vascular prosthetic device, implant and graft, subsequent encounter: Secondary | ICD-10-CM

## 2015-11-20 NOTE — Addendum Note (Signed)
Encounter addended by: Noralee Space, RN on: 11/20/2015  4:54 PM<BR>    Actions taken: Visit diagnoses modified, Order Entry activity accessed, Diagnosis association updated

## 2015-11-20 NOTE — Addendum Note (Signed)
Encounter addended by: Levonne Spiller, RN on: 11/20/2015  4:24 PM<BR>    Actions taken: Visit diagnoses modified, Order Entry activity accessed, Diagnosis association updated

## 2015-11-20 NOTE — Progress Notes (Addendum)
Pt presented to VAD clinic with c/o increased drainage from VAD driveline exit site. States he was mowing yard yesterday, dressing came off and he didn't notice. After inspection of site when he returned home, he had "dirt and sweat" around site. He cleaned site and replaced dressing.  He has been performing daily dressing changes using gauze dressings with no silver strips; states silver seems to have made things "worse". No anchor device present.    VAD dressing removed and site care performed using sterile technique. Drive line exit site cleaned with Chlora prep applicators x 2, allowed to dry, skin protectant applied and allowed to dry before gauze dressing with Aquacel silver strip applied. The velour is fully implanted at exit site. Exit site with redness, and small amount serous drainage; no tenderness or foul odor noted. Drive line anchor applied. Pt denies fever or chills. Driveline dressing is being changed daily per sterile technique.   Pt has had two positive wound cultures on 08/26/14 and 09/29/14 with SERRATIA MARCESCENS. Pt has completed a 7 day and 14 day course of Cipro for each. Drainage has increased over last few days; pt admits to being more active with mowing lawns, working on cars, and getting "hot and sweaty" daily.   Tonye Becket, NP in to inspect wound. Wound culture obtained, planned admission tomorrow for ID consult and possible IV antibiotics. Pt verbalized understanding of same.

## 2015-11-21 ENCOUNTER — Encounter (HOSPITAL_COMMUNITY): Payer: Self-pay | Admitting: General Practice

## 2015-11-21 ENCOUNTER — Inpatient Hospital Stay (HOSPITAL_COMMUNITY)
Admission: AD | Admit: 2015-11-21 | Discharge: 2015-11-24 | DRG: 315 | Disposition: A | Discharge status: Home / Self Care | Payer: Medicaid Other | Source: Ambulatory Visit | Attending: Internal Medicine | Admitting: Internal Medicine

## 2015-11-21 DIAGNOSIS — Z4800 Encounter for change or removal of nonsurgical wound dressing: Secondary | ICD-10-CM

## 2015-11-21 DIAGNOSIS — I428 Other cardiomyopathies: Secondary | ICD-10-CM | POA: Diagnosis present

## 2015-11-21 DIAGNOSIS — E0590 Thyrotoxicosis, unspecified without thyrotoxic crisis or storm: Secondary | ICD-10-CM | POA: Diagnosis present

## 2015-11-21 DIAGNOSIS — B9689 Other specified bacterial agents as the cause of diseases classified elsewhere: Secondary | ICD-10-CM | POA: Diagnosis present

## 2015-11-21 DIAGNOSIS — T829XXD Unspecified complication of cardiac and vascular prosthetic device, implant and graft, subsequent encounter: Secondary | ICD-10-CM | POA: Diagnosis not present

## 2015-11-21 DIAGNOSIS — I10 Essential (primary) hypertension: Secondary | ICD-10-CM | POA: Diagnosis present

## 2015-11-21 DIAGNOSIS — T829XXA Unspecified complication of cardiac and vascular prosthetic device, implant and graft, initial encounter: Secondary | ICD-10-CM

## 2015-11-21 DIAGNOSIS — I272 Other secondary pulmonary hypertension: Secondary | ICD-10-CM | POA: Diagnosis present

## 2015-11-21 DIAGNOSIS — Z7982 Long term (current) use of aspirin: Secondary | ICD-10-CM | POA: Diagnosis not present

## 2015-11-21 DIAGNOSIS — Y712 Prosthetic and other implants, materials and accessory cardiovascular devices associated with adverse incidents: Secondary | ICD-10-CM | POA: Diagnosis not present

## 2015-11-21 DIAGNOSIS — Z95811 Presence of heart assist device: Secondary | ICD-10-CM

## 2015-11-21 DIAGNOSIS — A498 Other bacterial infections of unspecified site: Secondary | ICD-10-CM

## 2015-11-21 DIAGNOSIS — Y831 Surgical operation with implant of artificial internal device as the cause of abnormal reaction of the patient, or of later complication, without mention of misadventure at the time of the procedure: Secondary | ICD-10-CM | POA: Diagnosis present

## 2015-11-21 DIAGNOSIS — Z79899 Other long term (current) drug therapy: Secondary | ICD-10-CM | POA: Diagnosis not present

## 2015-11-21 DIAGNOSIS — Z7901 Long term (current) use of anticoagulants: Secondary | ICD-10-CM

## 2015-11-21 DIAGNOSIS — R002 Palpitations: Secondary | ICD-10-CM | POA: Diagnosis present

## 2015-11-21 DIAGNOSIS — I110 Hypertensive heart disease with heart failure: Secondary | ICD-10-CM | POA: Diagnosis present

## 2015-11-21 DIAGNOSIS — T827XXA Infection and inflammatory reaction due to other cardiac and vascular devices, implants and grafts, initial encounter: Secondary | ICD-10-CM | POA: Diagnosis present

## 2015-11-21 DIAGNOSIS — Z4502 Encounter for adjustment and management of automatic implantable cardiac defibrillator: Secondary | ICD-10-CM

## 2015-11-21 DIAGNOSIS — Z9109 Other allergy status, other than to drugs and biological substances: Secondary | ICD-10-CM | POA: Diagnosis not present

## 2015-11-21 DIAGNOSIS — I5022 Chronic systolic (congestive) heart failure: Secondary | ICD-10-CM | POA: Diagnosis present

## 2015-11-21 HISTORY — DX: Presence of heart assist device: Z95.811

## 2015-11-21 LAB — LACTATE DEHYDROGENASE: LDH: 283 U/L — ABNORMAL HIGH (ref 98–192)

## 2015-11-21 LAB — PROTIME-INR
INR: 1.99
Prothrombin Time: 22.9 seconds — ABNORMAL HIGH (ref 11.4–15.2)

## 2015-11-21 MED ORDER — SACUBITRIL-VALSARTAN 97-103 MG PO TABS
1.0000 | ORAL_TABLET | Freq: Two times a day (BID) | ORAL | Status: DC
Start: 2015-11-21 — End: 2015-11-24
  Administered 2015-11-21 – 2015-11-24 (×5): 1 via ORAL
  Filled 2015-11-21 (×5): qty 1

## 2015-11-21 MED ORDER — ASPIRIN EC 325 MG PO TBEC
325.0000 mg | DELAYED_RELEASE_TABLET | Freq: Every day | ORAL | Status: DC
  Administered 2015-11-21 – 2015-11-24 (×4): 325 mg via ORAL
  Filled 2015-11-21 (×4): qty 1

## 2015-11-21 MED ORDER — PANTOPRAZOLE SODIUM 40 MG PO TBEC
40.0000 mg | DELAYED_RELEASE_TABLET | Freq: Every day | ORAL | Status: DC
  Administered 2015-11-22 – 2015-11-24 (×3): 40 mg via ORAL
  Filled 2015-11-21 (×3): qty 1

## 2015-11-21 MED ORDER — WARFARIN - PHARMACIST DOSING INPATIENT: Freq: Every day | Status: DC

## 2015-11-21 MED ORDER — CEFTRIAXONE SODIUM 2 G IJ SOLR
2.0000 g | INTRAMUSCULAR | Status: DC
  Administered 2015-11-22 – 2015-11-24 (×3): 2 g via INTRAVENOUS
  Filled 2015-11-21 (×3): qty 2

## 2015-11-21 MED ORDER — ADULT MULTIVITAMIN W/MINERALS CH
1.0000 | ORAL_TABLET | Freq: Every day | ORAL | Status: DC
  Administered 2015-11-22 – 2015-11-24 (×3): 1 via ORAL
  Filled 2015-11-21 (×4): qty 1

## 2015-11-21 MED ORDER — WARFARIN SODIUM 10 MG PO TABS
10.0000 mg | ORAL_TABLET | Freq: Once | ORAL | Status: AC
  Administered 2015-11-21: 10 mg via ORAL
  Filled 2015-11-21: qty 1

## 2015-11-21 MED ORDER — ALLOPURINOL 300 MG PO TABS
300.0000 mg | ORAL_TABLET | Freq: Every day | ORAL | Status: DC
  Administered 2015-11-22 – 2015-11-24 (×3): 300 mg via ORAL
  Filled 2015-11-21 (×2): qty 1
  Filled 2015-11-21: qty 3

## 2015-11-21 MED ORDER — HYDRALAZINE HCL 50 MG PO TABS
50.0000 mg | ORAL_TABLET | Freq: Two times a day (BID) | ORAL | Status: DC
  Administered 2015-11-22: 50 mg via ORAL
  Filled 2015-11-21 (×2): qty 1

## 2015-11-21 MED ORDER — AMLODIPINE BESYLATE 10 MG PO TABS
10.0000 mg | ORAL_TABLET | Freq: Every day | ORAL | Status: DC
  Administered 2015-11-22: 10 mg via ORAL
  Filled 2015-11-21: qty 1

## 2015-11-21 MED ORDER — DEXTROSE 5 % IV SOLN
1.0000 g | INTRAVENOUS | Status: DC
  Administered 2015-11-21: 1 g via INTRAVENOUS
  Filled 2015-11-21: qty 10

## 2015-11-21 MED ORDER — MEXILETINE HCL 150 MG PO CAPS
150.0000 mg | ORAL_CAPSULE | Freq: Two times a day (BID) | ORAL | Status: DC
  Administered 2015-11-21 – 2015-11-24 (×6): 150 mg via ORAL
  Filled 2015-11-21 (×6): qty 1

## 2015-11-21 MED ORDER — TRAMADOL HCL 50 MG PO TABS: 50.0000 mg | ORAL_TABLET | Freq: Three times a day (TID) | ORAL | Status: DC | PRN

## 2015-11-21 MED ORDER — DOXAZOSIN MESYLATE 2 MG PO TABS
8.0000 mg | ORAL_TABLET | Freq: Every day | ORAL | Status: DC
  Administered 2015-11-22: 8 mg via ORAL
  Filled 2015-11-21: qty 4

## 2015-11-21 NOTE — H&P (Signed)
VAD TEAM History & Physical Note  Reason for Admission: Drive line Infection HF: Dr. 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 status post dual chamber Medtronic ICD implanted April 2011. Cath 2011 showed normal coronaries. He also has a history of VTach . Currently listed as 1b list for heart tx at [facility].  Dual listed at [facility] and [facility].      Had RHC on 11/04/14 as part of dual listing process at [facility]. Normal filling pressures and cardiac output. Amio stopped due to thyrotoxicity. Placed on prednisone and tapazole. Had VT off amio and mexilitene started.  He presented to clinic 11/20/15 with worsening drainage from driveline site. Has had positive wound cultures x 2 with S. Marcescens and has completed a 7 day and a 14 day course of Cipro without resolution.    Now with increase drainage and questionable dressing care, getting "sweaty" daily.  Stable from a HF standpoint.  No bleeding on coumadin.   LVAD INTERROGATION:  HeartMate II LVAD:  Flow 5.2 liters/min, speed 9400, power 5.6, PI 7.0, occasional PI events.    Review of Systems: [y] = yes, [ ]  = no   General: Weight gain [ ] ; Weight loss [ ] ; Anorexia [ ] ; Fatigue [ ] ; Fever [ ] ; Chills [ ] ; Weakness [ ]   Cardiac: Chest pain/pressure [ ] ; Resting SOB [ ] ; Exertional SOB [Y]; Orthopnea [ ] ; Pedal Edema [ ] ; Palpitations [ ] ; Syncope [ ] ; Presyncope [ ] ; Paroxysmal nocturnal dyspnea[ ]   Pulmonary: Cough [ ] ; Wheezing[ ] ; Hemoptysis[ ] ; Sputum [ ] ; Snoring [ ]   GI: Vomiting[ ] ; Dysphagia[ ] ; Melena[ ] ; Hematochezia [ ] ; Heartburn[ ] ; Abdominal pain [ ] ; Constipation [ ] ; Diarrhea [ ] ; BRBPR [ ]   GU: Hematuria[ ] ; Dysuria [ ] ; Nocturia[ ]   Vascular: Pain in legs with walking [ ] ; Pain in feet with lying flat [ ] ; Non-healing sores [ ] ; Stroke [ ] ; TIA [ ] ; Slurred speech [ ] ;  Neuro: Headaches[  ]; Vertigo[ ] ; Seizures[ ] ; Paresthesias[ ] ;Blurred vision [ ] ; Diplopia [ ] ; Vision changes [ ]   Ortho/Skin: Arthritis [y]; Joint pain [y]; Muscle pain [ ] ; Joint swelling [ ] ; Back Pain [ ] ; Rash [y]  Psych: Depression[ ] ; Anxiety[ ]   Heme: Bleeding problems [ ] ; Clotting disorders [ ] ; Anemia [ ]   Endocrine: Diabetes [ ] ; Thyroid dysfunction[ ]     Home Medications Prior to Admission medications   Medication Sig Start Date End Date Taking? Authorizing Provider  allopurinol (ZYLOPRIM) 300 MG tablet Take 1 tablet (300 mg total) by mouth daily. 07/07/15   Dolores Patty, MD  amLODipine (NORVASC) 10 MG tablet Take 1 tablet (10 mg total) by mouth daily. 05/15/14   Dolores Patty, MD  aspirin EC 325 MG tablet Take 1 tablet (325 mg total) by mouth daily. 04/30/13   Dolores Patty, MD  doxazosin (CARDURA) 8 MG tablet Take 8 mg by mouth daily.    Historical Provider, MD  hydrALAZINE (APRESOLINE) 100 MG tablet Take 0.5 tablets (50 mg total) by mouth 2 (two) times daily. 07/07/15   Dolores Patty, MD  mexiletine (MEXITIL) 150 MG capsule Take 1 capsule (150 mg total) by mouth 2 (two) times daily. 11/18/14   Dolores Patty, MD  Multiple Vitamin (MULTIVITAMIN) tablet Take 1 tablet by mouth daily.    Historical Provider, MD  pantoprazole (PROTONIX) 40 MG tablet TAKE 1 TABLET BY MOUTH EVERY DAY 07/08/15   Dolores Pattyaniel R Shafin Pollio, MD  sacubitril-valsartan (ENTRESTO) 97-103 MG Take 1 tablet by mouth 2 (two) times daily. 04/09/15   Dolores Pattyaniel R Terreon Ekholm, MD  traMADol (ULTRAM) 50 MG tablet Take 1 tablet (50 mg total) by mouth every 8 (eight) hours as needed for moderate pain. 04/09/15   Mihai Croitoru, MD  warfarin (COUMADIN) 5 MG tablet TAKE AS DIRECTED BY VAD CLINIC Patient taking differently: Take 7 mg by mouth daily. TAKE AS DIRECTED BY VAD CLINIC 09/25/15   Dolores Pattyaniel R Trenee Igoe, MD    Past Medical History: Past Medical History:  Diagnosis Date  . AICD (automatic cardioverter/defibrillator) present 2011  .  Bronchitis   . Congestive heart failure (HCC) 2011  . Hypertension 2000  . LVAD (left ventricular assist device) present (HCC) 2014  . Pneumonia   . Pulmonary hypertension (HCC)     Past Surgical History: Past Surgical History:  Procedure Laterality Date  . CARDIAC CATHETERIZATION    . CARDIAC CATHETERIZATION N/A 11/04/2014   Procedure: Right Heart Cath;  Surgeon: Dolores Pattyaniel R Roan Sawchuk, MD;  [facility];  Service: Cardiovascular;  Laterality: N/A;  . CARDIAC CATHETERIZATION N/A 11/06/2015   Procedure: Right Heart Cath;  Surgeon: Dolores Pattyaniel R Edmond Ginsberg, MD;  [facility];  Service: Cardiovascular;  Laterality: N/A;  . CARDIAC DEFIBRILLATOR PLACEMENT  06/03/11  . COLONOSCOPY  12/29/2011   Procedure: COLONOSCOPY;  Surgeon: Iva Booparl E Gessner, MD;  Location: [facility] ENDOSCOPY;  Service: Endoscopy;  Laterality: N/A;  . INSERTION OF IMPLANTABLE LEFT VENTRICULAR ASSIST DEVICE N/A 06/02/2012   Procedure: INSERTION OF IMPLANTABLE LEFT VENTRICULAR ASSIST DEVICE;  Surgeon: Kerin PernaPeter Van Trigt, MD;  [facility];  Service: Open Heart Surgery;  Laterality: N/A;  Nitric Oxide; TEE; Medtronic AICD  . INTRA-AORTIC BALLOON PUMP INSERTION N/A 05/31/2012   Procedure: INTRA-AORTIC BALLOON PUMP INSERTION;  Surgeon: Dolores Pattyaniel R Garett Tetzloff, MD;  [facility];  Service: Cardiovascular;  Laterality: N/A;  . INTRAOPERATIVE TRANSESOPHAGEAL ECHOCARDIOGRAM N/A 06/02/2012   Procedure: INTRAOPERATIVE TRANSESOPHAGEAL ECHOCARDIOGRAM;  Surgeon: Kerin PernaPeter Van Trigt, MD;  [facility];  Service: Open Heart Surgery;  Laterality: N/A;  . LEFT AND RIGHT HEART CATHETERIZATION WITH CORONARY ANGIOGRAM N/A 08/17/2011   Procedure: LEFT AND RIGHT HEART CATHETERIZATION WITH CORONARY ANGIOGRAM;  Surgeon: Thurmon FairMihai Croitoru, MD;  Location: [facility] CATH LAB;  Service: Cardiovascular;  Laterality: N/A;  . RIGHT HEART CATHETERIZATION N/A 05/30/2012   Procedure: RIGHT HEART CATH;  Surgeon: Dolores Pattyaniel R Yemariam Magar, MD;  [facility];   Service: Cardiovascular;  Laterality: N/A;  . RIGHT HEART CATHETERIZATION N/A 06/10/2014   Procedure: RIGHT HEART CATH;  Surgeon: Dolores Pattyaniel R Clarnce Homan, MD;  [facility];  Service: Cardiovascular;  Laterality: N/A;    Family History: History reviewed. No pertinent family history.  Social History: Social History   Social History  . Marital status: Single    Spouse name: N/A  . Number of children: 2  . Years of education: N/A   Occupational History  . Disabled    Social History Main Topics  . Smoking status: Never Smoker  . Smokeless tobacco: Never Used  . Alcohol use No  . Drug use: No  . Sexual activity: Not Asked   Other Topics Concern  . None   Social History Narrative  . None    Allergies:  Allergies  Allergen Reactions  . Imdur [Isosorbide Nitrate] Other (See Comments)    Headache  . Metoprolol     Passes out    Objective:    Vital Signs:   Temp:  [98.2 F (36.8 C)] 98.2 F (36.8 C) (08/18 1400) Pulse Rate:  [67] 67 (08/18 1400) Resp:  [15] 15 (08/18 1400) BP: (92)/(60) 92/60 (08/18 1400) SpO2:  [100 %] 100 % (08/18 1400) Weight:  [249 lb 3.2 oz (113 kg)] 249 lb 3.2 oz (113 kg) (08/18 1400)   Filed Weights   11/21/15 1400  Weight: 249 lb 3.2 oz (113 kg)    Mean arterial Pressure 80s  Physical Exam: General:  Well appearing. No resp difficulty HEENT: normal Neck: supple. JVP not elevated. Carotids 2+ bilat; no bruits. No thyromegaly or nodule noted.  Cor: Mechanical heart sounds with LVAD hum present. Lungs: CTAB, normal effort Abdomen: soft, NT, ND, no HSM. No bruits or masses. +BS  Driveline: Securement device intact and driveline incorporated. Exit site with redness and small amount of serous drainage. No tenderness or odor noted.  Extremities: no cyanosis, clubbing, rash, edema Neuro: alert & orientedx3, cranial nerves grossly intact. moves all 4 extremities w/o difficulty. Affect pleasant  Telemetry: Reviewed, NSR   Labs: Basic  Metabolic Panel: No results for input(s): NA, K, CL, CO2, GLUCOSE, BUN, CREATININE, CALCIUM, MG, PHOS in the last 168 hours.  Liver Function Tests: No results for input(s): AST, ALT, ALKPHOS, BILITOT, PROT, ALBUMIN in the last 168 hours. No results for input(s): LIPASE, AMYLASE in the last 168 hours. No results for input(s): AMMONIA in the last 168 hours.  CBC: No results for input(s): WBC, NEUTROABS, HGB, HCT, MCV, PLT in the last 168 hours.  Cardiac Enzymes: No results for input(s): CKTOTAL, CKMB, CKMBINDEX, TROPONINI in the last 168 hours.  BNP: BNP (last 3 results)  Recent Labs  11/28/14 1032 06/19/15 1105  BNP 84.5 66.3    ProBNP (last 3 results) No results for input(s): PROBNP in the last 8760 hours.   CBG: No results for input(s): GLUCAP in the last 168 hours.  Coagulation Studies: No results for input(s): LABPROT, INR in the last 72 hours.  Other results: EKG: NSR 64 bpm, PVC  Imaging:  No results found.      Assessment:   1. Chronic systolic HF s/p HMII LVAD implant 05/2012. - NYHA II 2. Driveline infection 3. Palpitations 4. Hyperthyroidism 5. Chronic anticoagulation with coumadin   Plan/Discussion:    Admitted from clinic with ongoing wound infection.   Wound culture 11/21/15 pending.   BCx to be drawn.  Appreciated ID consult.  Placed on IV ceftriaxone pending results of cultures.   INR pending. Coumadin per pharmacy on result.   I reviewed the LVAD parameters from today, and compared the results to the patient's prior recorded data.  No programming changes were made.  The LVAD is functioning within specified parameters.  The patient performs LVAD self-test daily.  LVAD interrogation was negative for any significant power changes, alarms or PI events/speed drops.  LVAD equipment check completed and is in good working order.  Back-up equipment present.   LVAD education done on emergency procedures and precautions and reviewed exit site  care.  Length of Stay: 0  Luane School 11/21/2015, 3:05 PM  VAD Team Pager [phone] (7am - 7am) +++VAD ISSUES ONLY+++   Advanced Heart Failure Team Pager [phone] (M-F; 7a - 4p)  Please contact [facility] for night-coverage after hours (4p -  7a ) and weekends on [email or web address] for all non- LVAD Issues  Patient seen and examined with Otilio Saber, PA-C. We discussed all aspects of the encounter. I agree with the assessment and plan as stated above.   He has recurrent driveline exit site infection despite recent treatment with oral cipro. Previous culture was serratia marcennes. We recultured hi(wound and blood). Will admit for IV abx. ID has seen (thank you) and recommended IV ceftriaxone.   CHF and VAD parameters stable. Pharmacy to dose coumadin.   Robert Ackerman,MD 4:55 PM

## 2015-11-21 NOTE — Consult Note (Signed)
[facility]    Date of Admission:  11/21/2015          Reason for Consult: LVAD driveline exit site infection    Referring Physician: Dr. Nicholes Mango  Principal Problem:   Left ventricular assist device (LVAD) complication Active Problems:   Serratia infection   Essential hypertension, benign   NICM (EF <15% on TTE 01/2012), normal cors at cath 4/11   Chronic systolic heart failure (HCC)   Pulmonary hypertension, mild to moderate by 2D 5/13   LVAD (left ventricular assist device) present [facility])   ICD (implantable cardioverter-defibrillator) battery depletion   . allopurinol  300 mg Oral Daily  . amLODipine  10 mg Oral Daily  . aspirin EC  325 mg Oral Daily  . doxazosin  8 mg Oral Daily  . hydrALAZINE  50 mg Oral BID  . mexiletine  150 mg Oral BID  . multivitamin  1 tablet Oral Daily  . pantoprazole  40 mg Oral Daily  . sacubitril-valsartan  1 tablet Oral BID    Recommendations: 1. Start IV ceftriaxone pending the results of yesterday's culture   Assessment: He has developed persistent driveline exit site infection. This is probably due to the same Serratia he has grown previously. I will start him on IV ceftriaxone pending the results of the culture obtained in clinic yesterday. No organisms were seen on the Gram stain. Dr. Judyann Munson will follow up this weekend.    HPI: Robert [name] is a 60 y.o. male with chronic congestive heart failure who underwent LVAD placement in February 2014. About 3 months ago he developed some purulent drainage from his driveline exit site. Apparently he's had 2 cultures that have grown Serratia (I only see one from 09/29/2015 in Epic). He has been treated with 2 short courses of oral ciprofloxacin. He states that the drainage stopped each time he was on ciprofloxacin but then it returned. He has been off ciprofloxacin for about 3 weeks and has been having yellow drainage for the past week. He has  been able to do his usual activities. He has been working on cars and mowing lawns. He often gets very sweaty and hot. 2 days ago he noticed that his driveline exit site dressing had come off. He cleaned it very carefully and re-bandaged it. He has not had any fever, chills or sweats. He states that he currently feels "fine".   Review of Systems: Review of Systems  Constitutional: Negative for chills, diaphoresis, fever, malaise/fatigue and weight loss.  HENT: Negative for sore throat.   Respiratory: Negative for cough, sputum production and shortness of breath.   Cardiovascular: Negative for chest pain.  Gastrointestinal: Negative for abdominal pain, diarrhea, nausea and vomiting.  Skin: Negative for rash.  Neurological: Negative for headaches.    Past Medical History:  Diagnosis Date  . AICD (automatic cardioverter/defibrillator) present 2011  . Bronchitis   . Congestive heart failure (HCC) 2011  . Hypertension 2000  . Pneumonia   . Pulmonary hypertension (HCC)     Social History  Substance Use Topics  . Smoking status: Never Smoker  . Smokeless tobacco: Never Used  . Alcohol use No    No family history on file. Allergies  Allergen Reactions  . Imdur [Isosorbide Nitrate] Other (See Comments)    Headache  . Metoprolol     Passes out    OBJECTIVE: Blood pressure 92/60, pulse 67, temperature 98.2 F (  36.8 C), resp. rate 15, weight 249 lb 3.2 oz (113 kg), SpO2 100 %.  Physical Exam  Constitutional: He is oriented to person, place, and time.  He has stretched out in bed talking with family members. He is in very good spirits.  Cardiovascular:  Normal LVAD hum.  Pulmonary/Chest: Effort normal and breath sounds normal.  Healed sternal incision. No redness or swelling over left anterior chest AICD site.  Abdominal: Soft. There is no tenderness.  Pictures in his admission note show some erythema around the driveline exit site with some purulent drainage.  Neurological:  He is alert and oriented to person, place, and time.  Skin: No rash noted.  Psychiatric: Mood and affect normal.    Lab Results Lab Results  Component Value Date   WBC 8.4 10/28/2015   HGB 13.7 10/28/2015   HCT 42.4 10/28/2015   MCV 87.2 10/28/2015   PLT 147 (L) 10/28/2015    Lab Results  Component Value Date   CREATININE 1.11 10/28/2015   BUN 11 10/28/2015   NA 139 10/28/2015   K 3.7 10/28/2015   CL 105 10/28/2015   CO2 28 10/28/2015    Lab Results  Component Value Date   ALT 14 (L) 09/29/2015   AST 18 09/29/2015   ALKPHOS 78 09/29/2015   BILITOT 0.8 09/29/2015     Microbiology: Recent Results (from the past 240 hour(s))  Aerobic Culture (superficial specimen)     Status: None (Preliminary result)   Collection Time: 11/20/15  3:30 PM  Result Value Ref Range Status   Specimen Description WOUND EXIT SITE  Final   Special Requests DRIVE LINE EXIT SITE  Final   Gram Stain   Final    FEW WBC PRESENT, PREDOMINANTLY MONONUCLEAR NO ORGANISMS SEEN    Culture NO GROWTH < 24 HOURS  Final   Report Status PENDING  Incomplete    Cliffton AstersJohn Anastasija Anfinson, MD [facility] [phone] pager   [phone] 11/21/2015, 2:43 PM

## 2015-11-21 NOTE — Progress Notes (Signed)
ANTICOAGULATION CONSULT NOTE - Initial Consult  Pharmacy Consult for Warfarin  Indication: LVAD  Allergies  Allergen Reactions  . Imdur [Isosorbide Nitrate] Other (See Comments)    Headache  . Metoprolol     Passes out    Patient Measurements: Weight: 249 lb 3.2 oz (113 kg)   Vital Signs: Temp: 98.2 F (36.8 C) (08/18 1400) BP: 92/60 (08/18 1400) Pulse Rate: 67 (08/18 1400)  Labs:  Recent Labs  11/21/15 1657  LABPROT 22.9*  INR 1.99    CrCl cannot be calculated (Patient's most recent lab result is older than the maximum 21 days allowed.).   Medical History: Past Medical History:  Diagnosis Date  . AICD (automatic cardioverter/defibrillator) present 2011  . Bronchitis   . Congestive heart failure (HCC) 2011  . Hypertension 2000  . LVAD (left ventricular assist device) present (HCC) 2014  . Pneumonia   . 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  Warfarin PTA 7.5mg  qd.  INR 2.4 at last outpatient coag check and 1.9 today on admission.  Last h/h stable 7/25  will recheck in am.    Goal of Therapy:  INR 2-3 Monitor platelets by anticoagulation protocol: Yes   Plan:  Warfarin 10mg  x1 tonight  Daily INR, CBC Monitor s/s bleeding  Leota Sauers Pharm.D. CPP, BCPS Clinical Pharmacist [phone] 11/21/2015 6:32 PM

## 2015-11-22 DIAGNOSIS — T829XXD Unspecified complication of cardiac and vascular prosthetic device, implant and graft, subsequent encounter: Secondary | ICD-10-CM

## 2015-11-22 LAB — CBC
HEMATOCRIT: 43.4 % (ref 39.0–52.0)
Hemoglobin: 14 g/dL (ref 13.0–17.0)
MCH: 28.5 pg (ref 26.0–34.0)
MCHC: 32.3 g/dL (ref 30.0–36.0)
MCV: 88.2 fL (ref 78.0–100.0)
PLATELETS: 158 10*3/uL (ref 150–400)
RBC: 4.92 MIL/uL (ref 4.22–5.81)
RDW: 14.5 % (ref 11.5–15.5)
WBC: 7.4 10*3/uL (ref 4.0–10.5)

## 2015-11-22 LAB — PROTIME-INR
INR: 2.19
Prothrombin Time: 24.8 seconds — ABNORMAL HIGH (ref 11.4–15.2)

## 2015-11-22 LAB — BASIC METABOLIC PANEL
ANION GAP: 9 (ref 5–15)
BUN: 10 mg/dL (ref 6–20)
CHLORIDE: 105 mmol/L (ref 101–111)
CO2: 24 mmol/L (ref 22–32)
Calcium: 8.7 mg/dL — ABNORMAL LOW (ref 8.9–10.3)
Creatinine, Ser: 0.94 mg/dL (ref 0.61–1.24)
GFR calc Af Amer: >60 mL/min (ref >60)
GLUCOSE: 99 mg/dL (ref 65–99)
POTASSIUM: 3.5 mmol/L (ref 3.5–5.1)
SODIUM: 138 mmol/L (ref 135–145)

## 2015-11-22 LAB — LACTATE DEHYDROGENASE: LDH: 283 U/L — ABNORMAL HIGH (ref 98–192)

## 2015-11-22 MED ORDER — WARFARIN SODIUM 7.5 MG PO TABS
7.5000 mg | ORAL_TABLET | Freq: Every day | ORAL | Status: DC
  Administered 2015-11-22: 7.5 mg via ORAL
  Filled 2015-11-22: qty 1

## 2015-11-22 MED ORDER — SODIUM CHLORIDE 0.9 % IV BOLUS (SEPSIS)
250.0000 mL | Freq: Once | INTRAVENOUS | Status: AC
  Administered 2015-11-22: 250 mL via INTRAVENOUS

## 2015-11-22 NOTE — Progress Notes (Addendum)
Patient ID: Robert [name], male   DOB: 1955/09/24, 60 y.o.   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 status post dual chamber Medtronic ICD implanted April 2011. Cath 2011 showed normal coronaries. He also has a history of VTach . Currently listed as 1b list for heart tx at [facility]. Dual listed at [facility] and [facility].   Had RHC on 11/04/14 as part of dual listing process at [facility]. Normal filling pressures and cardiac output. Amio stopped due to thyrotoxicity. Placed on prednisone and tapazole. Had VT off amio and mexilitene started.  He presented to clinic 11/20/15 with worsening drainage from driveline site. Has had positive wound cultures x 2 with S. Marcescens from 6/17 and has completed a 7 day and a 14 day course of Cipro without resolution.  Now with increased drainage and questionable dressing care, getting "sweaty" daily.  Stable from a HF standpoint.  No bleeding on coumadin.   He is on IV ceftriaxone.  Wound cultures have not grown anything.  Blood cultures pending from yesterday.  Less drainage from driveline site, he says it looks better than yesterday.   LVAD INTERROGATION:  HeartMate II LVAD:  Flow 5.7 liters/min, speed 9400, power 5.9, PI 5.1.    Objective:    Vital Signs:   Temp:  [97.5 F (36.4 C)-98.2 F (36.8 C)] 98.2 F (36.8 C) (08/19 0549) Pulse Rate:  [61-67] 61 (08/19 1053) Resp:  [15-18] 18 (08/19 0549) BP: (92-100)/(60-85) 100/85 (08/19 1053) SpO2:  [100 %] 100 % (08/19 0549) Weight:  [247 lb 6.4 oz (112.2 kg)-249 lb 3.2 oz (113 kg)] 247 lb 6.4 oz (112.2 kg) (08/19 0405) Last BM Date: 11/21/15 Mean arterial Pressure 70s-80s  Intake/Output:   Intake/Output Summary (Last 24 hours) at 11/22/15 1122 Last data filed at 11/22/15 0800  Gross per 24 hour  Intake              240 ml  Output                 0 ml  Net              240 ml     Physical Exam: General:  Well appearing. No resp difficulty HEENT: normal Neck: supple. JVP not elevated. Carotids 2+ bilat; no bruits. No lymphadenopathy or thryomegaly appreciated. Cor: Mechanical heart sounds with LVAD hum present. Lungs: clear Abdomen: soft, nontender, nondistended. No hepatosplenomegaly. No bruits or masses. Good bowel sounds. Driveline: Mild erythema at exit site with minimal drainage; securement device intact and driveline incorporated Extremities: no cyanosis, clubbing, rash, edema Neuro: alert & orientedx3, cranial nerves grossly intact. moves all 4 extremities w/o difficulty. Affect pleasant  Telemetry: NSR  Labs: Basic Metabolic Panel:  Recent Labs Lab 11/22/15 0258  NA 138  K 3.5  CL 105  CO2 24  GLUCOSE 99  BUN 10  CREATININE 0.94  CALCIUM 8.7*    Liver Function Tests: No results for input(s): AST, ALT, ALKPHOS, BILITOT, PROT, ALBUMIN in the last 168 hours. No results for input(s): LIPASE, AMYLASE in the last 168 hours. No results for input(s): AMMONIA in the last 168 hours.  CBC:  Recent Labs Lab 11/22/15 0258  WBC 7.4  HGB 14.0  HCT 43.4  MCV 88.2  PLT 158    INR:  Recent Labs Lab 11/21/15 1657 11/22/15 0258  INR 1.99 2.19    Other results:  EKG:   Imaging:  No results found.   Medications:     Scheduled Medications: . allopurinol  300 mg Oral Daily  . amLODipine  10 mg Oral Daily  . aspirin EC  325 mg Oral Daily  . cefTRIAXone (ROCEPHIN)  IV  2 g Intravenous Q24H  . doxazosin  8 mg Oral Daily  . hydrALAZINE  50 mg Oral BID  . mexiletine  150 mg Oral BID  . multivitamin with minerals  1 tablet Oral Daily  . pantoprazole  40 mg Oral Daily  . sacubitril-valsartan  1 tablet Oral BID  . Warfarin - Pharmacist Dosing Inpatient   Does not apply q1800     Infusions:     PRN Medications:  traMADol   Assessment:   1. Chronic systolic HF s/p HMII LVAD  implant 05/2012. - NYHA II 2. Driveline infection 3. Palpitations 4. Hyperthyroidism 5. Chronic anticoagulation with coumadin  Plan/Discussion:    Admitted from clinic with ongoing driveline exit site infection despite treatment with oral Cipro for prior Serratia infection from 6/17. Site looks better today with minimal drainage.   Wound culture 11/21/15 negative so far.   BCx drawn with report pending.  Afebrile, WBCs normal.   Appreciated ID consult.  Placed on IV ceftriaxone pending results of cultures.   INR therapeutic.   I reviewed the LVAD parameters from today, and compared the results to the patient's prior recorded data.  No programming changes were made.  The LVAD is functioning within specified parameters.  The patient performs LVAD self-test daily.  LVAD interrogation was negative for any significant power changes, alarms or PI events/speed drops.  LVAD equipment check completed and is in good working order.  Back-up equipment present.   LVAD education done on emergency procedures and precautions and reviewed exit site care.  Length of Stay: 1  Marca AnconaDalton Madelein Mahadeo 11/22/2015, 11:22 AM  VAD Team --- VAD ISSUES ONLY--- Pager [phone] (7am - 7am)  Advanced Heart Failure Team  Pager [phone] (M-F; 7a - 4p)  Please contact [facility] for night-coverage after hours (4p -7a ) and weekends on [email or web address]  Called by nurses, patient became diaphoretic and felt "sick."  MAP low.  This occurred after getting all his morning BP meds.  He got doxazosin and hydralazine, which he does not think he is taking at home.  Also got amlodipine and Entresto, which he has been taking.  Will stop doxazosin and hydralazine, hold pm dose of Entresto.  Will give 250 cc NS bolus.   Marca AnconaDalton Mahmud Keithly 11/22/2015 11:59 AM

## 2015-11-22 NOTE — Progress Notes (Signed)
CCMD notified this RN of pt having runs of vtach. Upon assessment, pt reported feeling like something "wasn't right". Pt requested that this RN bring him a trash can to his bedside because he felt like he was "going to be sick". Pt became very diaphoretic and kept repeating that he "did not feel good". Pt stated that he thought it was because of the BP meds that he was given earlier. Pt's PI was 2.5. VAD nurse was notified. Charge nurse was notified. Rapid response nurse was called to assess pt. Pt was placed on 2 liters of oxygen and saline was started via IV. Order was given from MD to administer a 250 mL saline bolus. After bolus pt felt "better", but not "great". MD gave order for another 250 mL saline bolus. After second bolus, pt's PI had come up to 3.4 and pt stated that he felt better. Will continue to monitor pt.   Berdine Dance BSN, RN

## 2015-11-22 NOTE — Progress Notes (Signed)
[facility]    Date of Admission:  11/21/2015   Total days of antibiotics 2        Day 2 ceftriaxone           ID: Robert [name] is a 60 y.o. male  with chronic congestive heart failure s/p destination LVAD placement in February 2014 c/b driveline exit site infection with serratia in may 2017 and June 2017 s/p  2 short courses of oral ciprofloxacin. He has been off ciprofloxacin for about 3 weeks and has been having yellow drainage for the past week concern for recurrent infection Principal Problem:   Left ventricular assist device (LVAD) complication Active Problems:   Essential hypertension, benign   NICM (EF <15% on TTE 01/2012), normal cors at cath 4/11   Chronic systolic heart failure (HCC)   Pulmonary hypertension, mild to moderate by 2D 5/13   LVAD (left ventricular assist device) present [facility](HCC)   ICD (implantable cardioverter-defibrillator) battery depletion   Serratia infection    Subjective: Afebrile, received addn blood pressure meds that he normally does not take thus he was hypotensive with low cardiac output this morning. He is now feeling better. He reports very little drainage from his driveline site  Medications:  . allopurinol  300 mg Oral Daily  . amLODipine  10 mg Oral Daily  . aspirin EC  325 mg Oral Daily  . cefTRIAXone (ROCEPHIN)  IV  2 g Intravenous Q24H  . doxazosin  8 mg Oral Daily  . hydrALAZINE  50 mg Oral BID  . mexiletine  150 mg Oral BID  . multivitamin with minerals  1 tablet Oral Daily  . pantoprazole  40 mg Oral Daily  . sacubitril-valsartan  1 tablet Oral BID  . Warfarin - Pharmacist Dosing Inpatient   Does not apply q1800    Objective: Vital signs in last 24 hours: Temp:  [97.5 F (36.4 C)-98.2 F (36.8 C)] 98.2 F (36.8 C) (08/19 0549) Pulse Rate:  [61-67] 61 (08/19 1053) Resp:  [15-18] 18 (08/19 0549) BP: (92-100)/(60-85) 100/85 (08/19 1053) SpO2:  [100 %] 100 % (08/19 0549) Weight:  [247 lb 6.4 oz  (112.2 kg)-249 lb 3.2 oz (113 kg)] 247 lb 6.4 oz (112.2 kg) (08/19 0405)  Physical Exam  Constitutional: He is oriented to person, place, and time. He appears well-developed and well-nourished. No distress.  HENT:  Mouth/Throat: Oropharynx is clear and moist. No oropharyngeal exudate.  Cardiovascular: generate hum heard throughout precordium. Exam reveals no gallop and no friction rub.  No murmur heard.  Pulmonary/Chest: Effort normal and breath sounds normal. No respiratory distress. He has no wheezes.  Abdominal: Soft. Bowel sounds are normal. He exhibits no distension. There is no tenderness. driveline exit site shows no erythema slight browning crusting on the line. No drainage noted on dressing Lymphadenopathy:  He has no cervical adenopathy.  Neurological: He is alert and oriented to person, place, and time.  Skin: Skin is warm and dry. No rash noted. No erythema.  Psychiatric: He has a normal mood and affect. His behavior is normal.     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 Previous isolates were sensitive to ceftriaxone per my review of historic micro results. Recommend to continue with ceftriaxone and will attempt to narrow based upon results.  Drue Second [facility] Cell: [phone] Pager: [phone]  11/22/2015, 11:16 AM

## 2015-11-22 NOTE — Progress Notes (Signed)
ANTICOAGULATION CONSULT NOTE  Pharmacy Consult for Warfarin  Indication: LVAD  Allergies  Allergen Reactions  . Imdur [Isosorbide Nitrate] Other (See Comments)    Headache  . Metoprolol     Passes out    Patient Measurements: Weight: 247 lb 6.4 oz (112.2 kg)   Vital Signs: Temp: 98.2 F (36.8 C) (08/19 0549) Temp Source: Oral (08/19 0549) BP: 100/85 (08/19 1053) Pulse Rate: 61 (08/19 1053)  Labs:  Recent Labs  11/21/15 1657 11/22/15 0258  HGB  --  14.0  HCT  --  43.4  PLT  --  158  LABPROT 22.9* 24.8*  INR 1.99 2.19  CREATININE  --  0.94    Estimated Creatinine Clearance: 111.3 mL/min (by C-G formula based on SCr of 0.94 mg/dL).   Medical History: Past Medical History:  Diagnosis Date  . AICD (automatic cardioverter/defibrillator) present 2011  . Bronchitis   . Congestive heart failure (HCC) 2011  . Hypertension 2000  . LVAD (left ventricular assist device) present (HCC) 2014  . Pneumonia   . Pulmonary hypertension (HCC)       Assessment: 60yom with LVAD admitted for drive line infection with history of serratia infection now on ceftriaxone.  Warfarin PTA 7.5mg  qd.  INR= 2.19 today  Goal of Therapy:  INR 2-3 Monitor platelets by anticoagulation protocol: Yes   Plan:  Warfarin 7.5mg /day Daily INR,   Harland German, Pharm D 11/22/2015 12:03 PM

## 2015-11-23 LAB — AEROBIC CULTURE  (SUPERFICIAL SPECIMEN)

## 2015-11-23 LAB — CBC
HEMATOCRIT: 38.8 % — AB (ref 39.0–52.0)
Hemoglobin: 12.4 g/dL — ABNORMAL LOW (ref 13.0–17.0)
MCH: 27.9 pg (ref 26.0–34.0)
MCHC: 32 g/dL (ref 30.0–36.0)
MCV: 87.2 fL (ref 78.0–100.0)
Platelets: 140 10*3/uL — ABNORMAL LOW (ref 150–400)
RBC: 4.45 MIL/uL (ref 4.22–5.81)
RDW: 14.5 % (ref 11.5–15.5)
WBC: 8.2 10*3/uL (ref 4.0–10.5)

## 2015-11-23 LAB — LACTATE DEHYDROGENASE: LDH: 231 U/L — ABNORMAL HIGH (ref 98–192)

## 2015-11-23 LAB — BASIC METABOLIC PANEL
Anion gap: 10 (ref 5–15)
BUN: 11 mg/dL (ref 6–20)
CHLORIDE: 104 mmol/L (ref 101–111)
CO2: 24 mmol/L (ref 22–32)
Calcium: 8.8 mg/dL — ABNORMAL LOW (ref 8.9–10.3)
Creatinine, Ser: 1.08 mg/dL (ref 0.61–1.24)
GFR calc Af Amer: >60 mL/min (ref >60)
GFR calc non Af Amer: >60 mL/min (ref >60)
Glucose, Bld: 92 mg/dL (ref 65–99)
POTASSIUM: 3.5 mmol/L (ref 3.5–5.1)
SODIUM: 138 mmol/L (ref 135–145)

## 2015-11-23 LAB — PROTIME-INR
INR: 2.89
Prothrombin Time: 30.9 seconds — ABNORMAL HIGH (ref 11.4–15.2)

## 2015-11-23 LAB — AEROBIC CULTURE W GRAM STAIN (SUPERFICIAL SPECIMEN)

## 2015-11-23 MED ORDER — WARFARIN SODIUM 5 MG PO TABS
5.0000 mg | ORAL_TABLET | Freq: Once | ORAL | Status: AC
  Administered 2015-11-23: 5 mg via ORAL
  Filled 2015-11-23: qty 1

## 2015-11-23 MED ORDER — SODIUM CHLORIDE 0.9 % IV BOLUS (SEPSIS)
250.0000 mL | Freq: Once | INTRAVENOUS | Status: AC
  Administered 2015-11-23: 250 mL via INTRAVENOUS

## 2015-11-23 NOTE — Progress Notes (Signed)
    [facility]    Date of Admission:  11/21/2015   Total days of antibiotics 3        Day 3 ceftriaxone           ID: Robert [name] is a 60 y.o. male  with chronic congestive heart failure s/p destination LVAD placement in February 2014 c/b driveline exit site infection with serratia in may 2017 and June 2017 s/p  2 short courses of oral ciprofloxacin. He has been off ciprofloxacin for about 3 weeks and has been having yellow drainage for the past week concern for recurrent infection Principal Problem:   Left ventricular assist device (LVAD) complication Active Problems:   Essential hypertension, benign   NICM (EF <15% on TTE 01/2012), normal cors at cath 4/11   Chronic systolic heart failure (HCC)   Pulmonary hypertension, mild to moderate by 2D 5/13   LVAD (left ventricular assist device) present [facility])   ICD (implantable cardioverter-defibrillator) battery depletion   Serratia infection    Subjective: Afebrile, very little drainage from his driveline site. He feels better than yesterday morning  Medications:  . allopurinol  300 mg Oral Daily  . amLODipine  10 mg Oral Daily  . aspirin EC  325 mg Oral Daily  . cefTRIAXone (ROCEPHIN)  IV  2 g Intravenous Q24H  . mexiletine  150 mg Oral BID  . multivitamin with minerals  1 tablet Oral Daily  . pantoprazole  40 mg Oral Daily  . sacubitril-valsartan  1 tablet Oral BID  . warfarin  7.5 mg Oral q1800  . Warfarin - Pharmacist Dosing Inpatient   Does not apply q1800    Objective: Vital signs in last 24 hours: Temp:  [97.6 F (36.4 C)-98.2 F (36.8 C)] 97.7 F (36.5 C) (08/20 0529) Pulse Rate:  [61-80] 64 (08/20 0529) Resp:  [18-20] 18 (08/20 0529) BP: (71-100)/(50-85) 81/51 (08/19 2044) SpO2:  [97 %-100 %] 97 % (08/20 0529) Weight:  [250 lb 14.4 oz (113.8 kg)] 250 lb 14.4 oz (113.8 kg) (08/20 0534)  Physical Exam  Constitutional: He is oriented to person, place, and time. He appears  well-developed and well-nourished. No distress.  HENT:  Mouth/Throat: Oropharynx is clear and moist. No oropharyngeal exudate.  Cardiovascular: generate hum heard throughout precordium.  Pulmonary/Chest: Effort normal and breath sounds normal. No respiratory distress. He has no wheezes.  Abdominal: Soft. Bowel sounds are normal. He exhibits no distension. There is no tenderness. driveline exit site shows no erythema, no drainage Skin: Skin is warm and dry. No rash noted. No erythema.  Lab Results  Recent Labs  11/22/15 0258 11/23/15 0301  WBC 7.4 8.2  HGB 14.0 12.4*  HCT 43.4 38.8*  NA 138 138  K 3.5 3.5  CL 105 104  CO2 24 24  BUN 10 11  CREATININE 0.94 1.08   Microbiology: 8/17 wound few serratia 8/18 blood cx x 2 ngtd   Assessment/Plan: Recurrent serratia driveline exit site infection = sensitivities are pending. Previous isolates were sensitive to ceftriaxone which he is on currently. Recommend to continue with ceftriaxone and will attempt to switch to orals when sensitivities return. May need to consider to extend beyond 2 wk course of treatment  Dr Orvan Falconer to provide final recs tomorrow once sensitivities return  Joee Iovine, [facility] for Infectious Diseases Cell: [phone] Pager: [phone]  11/23/2015, 8:59 AM

## 2015-11-23 NOTE — Progress Notes (Signed)
Patient ID: Robert [name], male   DOB: 02-Nov-1955, 60 y.o.   MRN: [id number] HeartMate 2 Rounding Note  Subjective:    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 status post dual chamber Medtronic ICD implanted April 2011. Cath 2011 showed normal coronaries. He also has a history of VTach . [facility]. [facility]   Had RHC on 11[facility]04/14 [facility]. Normal filling pressures and cardiac output. Amio stopped due to thyrotoxicity. Placed on prednisone and tapazole. Had VT off amio and mexilitene started.  He presented to clinic 11/20/15 with worsening drainage from driveline site. Has had positive wound cultures x 2 with S. Marcescens from 6/17 and has completed a 7 day and a 14 day course of Cipro without resolution. Admitted with increased drainage and questionable dressing care, getting "sweaty" daily.  Stable from a HF standpoint.  No bleeding on coumadin.   He is on IV ceftriaxone.  Wound cultures with few Serratia pending sensitivities.  Blood cultures no growth so far.  Less drainage from driveline site, he says it looks better than when admitted.   Yesterday, got doxazosin, amlodipine, hydralazine, and Entresto.  PI dropped, he got diaphoretic and nauseated.  MAP into 60s. He says that he has not been taking hydralazine or doxazosin at home, these were stopped.  MAP this morning is 78.  He had about 30 PI events in the last 24 hrs.  Got 2 saline boluses.  Feels fine today.  LVAD INTERROGATION:  HeartMate II LVAD:  Flow 5.4 liters/min, speed 9400, power 6.2, PI 5.8. Around 30 PI events/24 hrs.  Objective:    Vital Signs:   Temp:  [97.6 F (36.4 C)-98.2 F (36.8 C)] 97.7 F (36.5 C) (08/20 0529) Pulse Rate:  [61-80] 64 (08/20 0529) Resp:  [18-20] 18 (08/20 0529) BP: (71-100)/(50-85)  81/51 (08/19 2044) SpO2:  [97 %-100 %] 97 % (08/20 0529) Weight:  [250 lb 14.4 oz (113.8 kg)] 250 lb 14.4 oz (113.8 kg) (08/20 0534) Last BM Date: 11/22/15 Mean arterial Pressure 78  Intake/Output:   Intake/Output Summary (Last 24 hours) at 11/23/15 0926 Last data filed at 11/22/15 1200  Gross per 24 hour  Intake              120 ml  Output                0 ml  Net              120 ml     Physical Exam: General:  Well appearing. No resp difficulty HEENT: normal Neck: supple. JVP not elevated. Carotids 2+ bilat; no bruits. No lymphadenopathy or thryomegaly appreciated. Cor: Mechanical heart sounds with LVAD hum present. Lungs: clear Abdomen: soft, nontender, nondistended. No hepatosplenomegaly. No bruits or masses. Good bowel sounds. Driveline: Mild erythema at exit site with minimal drainage; securement device intact and driveline incorporated Extremities: no cyanosis, clubbing, rash, edema Neuro: alert & orientedx3, cranial nerves grossly intact. moves all 4 extremities w/o difficulty. Affect pleasant  Telemetry: NSR  Labs: Basic Metabolic Panel:  Recent Labs Lab 11/22/15 0258 11/23/15 0301  NA 138 138  K 3.5 3.5  CL 105 104  CO2 24 24  GLUCOSE 99 92  BUN 10 11  CREATININE 0.94 1.08  CALCIUM 8.7* 8.8*    Liver Function Tests: No results for input(s): AST, ALT, ALKPHOS, BILITOT, PROT, ALBUMIN in the last 168 hours. No results for input(s): LIPASE, AMYLASE in the last 168 hours. No results for input(s): AMMONIA in the last 168 hours.  CBC:  Recent Labs Lab 11/22/15 0258 11/23/15 0301  WBC 7.4 8.2  HGB 14.0 12.4*  HCT 43.4 38.8*  MCV 88.2 87.2  PLT 158 140*    INR:  Recent Labs Lab 11/21/15 1657 11/22/15 0258 11/23/15 0301  INR 1.99 2.19 2.89    Other results:  EKG:   Imaging: No results found.   Medications:     Scheduled Medications: . allopurinol  300 mg Oral Daily  . aspirin EC  325 mg Oral Daily  . cefTRIAXone (ROCEPHIN)  IV   2 g Intravenous Q24H  . mexiletine  150 mg Oral BID  . multivitamin with minerals  1 tablet Oral Daily  . pantoprazole  40 mg Oral Daily  . sacubitril-valsartan  1 tablet Oral BID  . sodium chloride  250 mL Intravenous Once  . warfarin  7.5 mg Oral q1800  . Warfarin - Pharmacist Dosing Inpatient   Does not apply q1800    Infusions:    PRN Medications: traMADol   Assessment:   1. Chronic systolic HF s/p HMII LVAD implant 05/2012. - NYHA II 2. Driveline infection 3. Palpitations 4. Hyperthyroidism 5. Chronic anticoagulation with coumadin  Plan/Discussion:    Admitted from clinic with ongoing driveline exit site infection despite treatment with oral Cipro for prior Serratia infection from 6/17. Site looks better today with minimal drainage.   Wound culture 11/21/15 with few Serratia pending sensitivities.  Blood cultures negative so far.  Afebrile, WBCs normal.   Appreciate ID consult.  Placed on IV ceftriaxone pending results of cultures.   INR therapeutic.   MAP/PI dropped yesterday, received BP meds we thought he was on at home but now reports he was not taking doxazosin or hydralazine.  Both of these were stopped.  MAP 78 today and feels fine.  Still with frequent PI events (30 in 24 hours).  I am going to stop amlodipine for now as well, he will only get Entresto today.  I will give him a bit more IV fluid today and encourage po hydration.    If he remains stable today, probably home tomorrow once we determine a po antibiotic regimen.   I reviewed the LVAD parameters from today, and compared the results to the patient's prior recorded data.  No programming changes were made.  The LVAD is functioning within specified parameters.  The patient performs LVAD self-test daily.  LVAD interrogation was negative for any significant power changes, alarms or PI events/speed drops.  LVAD equipment check completed and is in good working order.  Back-up equipment present.   LVAD  education done on emergency procedures and precautions and reviewed exit site care.  Length of Stay: 2  Marca Ancona 11/23/2015, 9:26 AM  VAD Team --- VAD ISSUES ONLY--- Pager [phone] (7am - 7am)  Advanced Heart Failure Team  Pager [phone] (M-F; 7a - 4p)  Please contact [facility] for night-coverage after hours (4p -7a ) and weekends on [email or web address]

## 2015-11-23 NOTE — Progress Notes (Signed)
ANTICOAGULATION CONSULT NOTE  Pharmacy Consult for Warfarin  Indication: LVAD  Allergies  Allergen Reactions  . Imdur [Isosorbide Nitrate] Other (See Comments)    Headache  . Metoprolol     Passes out    Patient Measurements: Weight: 250 lb 14.4 oz (113.8 kg)   Vital Signs: Temp: 97.7 F (36.5 C) (08/20 0529) Temp Source: Oral (08/20 0529) Pulse Rate: 64 (08/20 0529)  Labs:  Recent Labs  11/21/15 1657 11/22/15 0258 11/23/15 0301  HGB  --  14.0 12.4*  HCT  --  43.4 38.8*  PLT  --  158 140*  LABPROT 22.9* 24.8* 30.9*  INR 1.99 2.19 2.89  CREATININE  --  0.94 1.08    Estimated Creatinine Clearance: 97.5 mL/min (by C-G formula based on SCr of 1.08 mg/dL).   Medical History: Past Medical History:  Diagnosis Date  . AICD (automatic cardioverter/defibrillator) present 2011  . Bronchitis   . Congestive heart failure (HCC) 2011  . Hypertension 2000  . LVAD (left ventricular assist device) present (HCC) 2014  . Pneumonia   . Pulmonary hypertension (HCC)       Assessment: 60yom with LVAD admitted for drive line infection with serratia infection now on ceftriaxone.  Warfarin PTA 7.5mg  qd.  INR= 2.89 today (trend up)  Goal of Therapy:  INR 2-3 Monitor platelets by anticoagulation protocol: Yes   Plan:  Warfarin 5mg  po today Daily INR  Harland German, Pharm D 11/23/2015 11:10 AM

## 2015-11-24 ENCOUNTER — Inpatient Hospital Stay (HOSPITAL_COMMUNITY): Admission: RE | Admit: 2015-11-24 | Payer: Medicaid Other | Source: Ambulatory Visit

## 2015-11-24 LAB — CBC
HCT: 39.5 % (ref 39.0–52.0)
HEMOGLOBIN: 12.8 g/dL — AB (ref 13.0–17.0)
MCH: 28.6 pg (ref 26.0–34.0)
MCHC: 32.4 g/dL (ref 30.0–36.0)
MCV: 88.2 fL (ref 78.0–100.0)
Platelets: 145 10*3/uL — ABNORMAL LOW (ref 150–400)
RBC: 4.48 MIL/uL (ref 4.22–5.81)
RDW: 14.6 % (ref 11.5–15.5)
WBC: 6.8 10*3/uL (ref 4.0–10.5)

## 2015-11-24 LAB — BASIC METABOLIC PANEL
ANION GAP: 6 (ref 5–15)
BUN: 10 mg/dL (ref 6–20)
CALCIUM: 8.5 mg/dL — AB (ref 8.9–10.3)
CO2: 26 mmol/L (ref 22–32)
Chloride: 106 mmol/L (ref 101–111)
Creatinine, Ser: 1.08 mg/dL (ref 0.61–1.24)
GFR calc Af Amer: >60 mL/min (ref >60)
GLUCOSE: 94 mg/dL (ref 65–99)
Potassium: 3.7 mmol/L (ref 3.5–5.1)
SODIUM: 138 mmol/L (ref 135–145)

## 2015-11-24 LAB — PROTIME-INR
INR: 2.7
PROTHROMBIN TIME: 29.2 s — AB (ref 11.4–15.2)

## 2015-11-24 LAB — LACTATE DEHYDROGENASE: LDH: 225 U/L — ABNORMAL HIGH (ref 98–192)

## 2015-11-24 MED ORDER — CIPROFLOXACIN HCL 500 MG PO TABS: 500.0000 mg | ORAL_TABLET | Freq: Two times a day (BID) | ORAL | 0 refills | Status: AC

## 2015-11-24 MED ORDER — WARFARIN SODIUM 5 MG PO TABS: 5.0000 mg | ORAL_TABLET | Freq: Every day | ORAL | 6 refills | Status: DC

## 2015-11-24 MED ORDER — AMLODIPINE BESYLATE 5 MG PO TABS
5.0000 mg | ORAL_TABLET | Freq: Every day | ORAL | Status: DC
  Filled 2015-11-24: qty 1

## 2015-11-24 MED ORDER — AMLODIPINE BESYLATE 10 MG PO TABS: 5.0000 mg | ORAL_TABLET | Freq: Every day | ORAL | 3 refills | Status: DC

## 2015-11-24 NOTE — Progress Notes (Signed)
Admitted 11/21/15 due to persistent DL exit site infection.   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 Donata Clay. Currently listed 1B at [facility] and [facility] for heart transplant.   Vital signs: HR: 60 - 70s, 1deg HB / PVCs  Doppler MAP: 80 - 90s Automated BP: not done today O2 Sat: 97 - 98% RA Wt:  247 > 250 > 250    LVAD interrogation reveals:  Speed: 9400 Flow: 5.2 Power: 6.0w  PI: 6.4 Alarms: none since admission (LV advisories PTA) Events: 5 - 10 QD Fixed speed: 9400 Low speed limit: 8800  Drive Line:  I performed wound care on his exit site today prior to D/C home. Still with some small tan drainage but much improved since admission. Continue daily dressing care at home with Aquacel Ag strips. Patient has enough silver strips for now. I brought him 15 daily dressing kits to take home.   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 4w of Cipro therapy PO per ID.  -BCs >> NG x 2   Anticoagulation: -325 mg ASA -INR goal 2 - 3  -No bleeding/thrombotic adverse events while on VAD therapy  Labs:  LDH trend: 283 > 231 > 225  INR trend: 1.99 > 2.18 > 2.89 > 2.70  WBC: 7.4 > 8.2 > 6.8   Plan/Recommendations:  1. Reviewed all meds prior to D/C home and he understands changes. He is concerned about his warfarin dose as it is different from what he was on OP. Explained that he is on ciprofloxacin BID now and it will make his body require less warfarin than before. Close OP follow up on Thursday of this week as he will go home to resume normal diet as well. D/C dose 5mg  PO QD (PTA was taking 7.5 mg QD).   2. VAD appointment scheduled Monday 12/01/15 to FU with MD. He understands all D/C instructions and has no questions.   Rexene Alberts, RN VAD Coordinator   Office: [phone] 24/7 Emergency VAD Pager: [phone]

## 2015-11-24 NOTE — Progress Notes (Signed)
Patient ID: Robert [name], male   DOB: 1955/11/29, 60 y.o.   MRN: [id number]          [facility]    Date of Admission:  11/21/2015    Day 3 ceftriaxone  Principal Problem:   Left ventricular assist device (LVAD) complication Active Problems:   Serratia infection   Essential hypertension, benign   NICM (EF <15% on TTE 01/2012), normal cors at cath 4/11   Chronic systolic heart failure (HCC)   Pulmonary hypertension, mild to moderate by 2D 5/13   LVAD (left ventricular assist device) present [facility](HCC)   ICD (implantable cardioverter-defibrillator) battery depletion   . allopurinol  300 mg Oral Daily  . amLODipine  5 mg Oral Daily  . aspirin EC  325 mg Oral Daily  . cefTRIAXone (ROCEPHIN)  IV  2 g Intravenous Q24H  . mexiletine  150 mg Oral BID  . multivitamin with minerals  1 tablet Oral Daily  . pantoprazole  40 mg Oral Daily  . sacubitril-valsartan  1 tablet Oral BID  . Warfarin - Pharmacist Dosing Inpatient   Does not apply q1800    SUBJECTIVE: He tells me that the drainage from his LVAD driveline has improved over the weekend.  Review of Systems: Review of Systems  Constitutional: Negative for chills, diaphoresis and fever.  Gastrointestinal: Negative for abdominal pain, diarrhea, nausea and vomiting.    Past Medical History:  Diagnosis Date  . AICD (automatic cardioverter/defibrillator) present 2011  . Bronchitis   . Congestive heart failure (HCC) 2011  . Hypertension 2000  . LVAD (left ventricular assist device) present (HCC) 2014  . Pneumonia   . Pulmonary hypertension (HCC)     Social History  Substance Use Topics  . Smoking status: Never Smoker  . Smokeless tobacco: Never Used  . Alcohol use No    History reviewed. No pertinent family history. Allergies  Allergen Reactions  . Imdur [Isosorbide Nitrate] Other (See Comments)    Headache  . Metoprolol     Passes out    OBJECTIVE: Vitals:   11/23/15 2139 11/24/15 0146  11/24/15 0446 11/24/15 0545  BP:      Pulse:   (!) 103 76  Resp: 18  18   Temp: 98 F (36.7 C)  98 F (36.7 C)   TempSrc: Oral  Oral   SpO2: 98% 97% 98% 98%  Weight:   250 lb 8 oz (113.6 kg)    Body mass index is 32.16 kg/m.  Physical Exam  Constitutional:  He is in good spirits.  Abdominal: Soft. There is tenderness.  Scant yellow drainage on the driveline dressing placed yesterday. No surrounding erythema or fluctuance.    Lab Results Lab Results  Component Value Date   WBC 6.8 11/24/2015   HGB 12.8 (L) 11/24/2015   HCT 39.5 11/24/2015   MCV 88.2 11/24/2015   PLT 145 (L) 11/24/2015    Lab Results  Component Value Date   CREATININE 1.08 11/24/2015   BUN 10 11/24/2015   NA 138 11/24/2015   K 3.7 11/24/2015   CL 106 11/24/2015   CO2 26 11/24/2015    Lab Results  Component Value Date   ALT 14 (L) 09/29/2015   AST 18 09/29/2015   ALKPHOS 78 09/29/2015   BILITOT 0.8 09/29/2015     Microbiology: Recent Results (from the past 240 hour(s))  Aerobic Culture (superficial specimen)     Status: None   Collection Time: 11/20/15  3:30 PM  Result Value Ref Range Status   Specimen Description WOUND EXIT SITE  Final   Special Requests DRIVE LINE EXIT SITE  Final   Gram Stain   Final    FEW WBC PRESENT, PREDOMINANTLY MONONUCLEAR NO ORGANISMS SEEN    Culture FEW SERRATIA MARCESCENS  Final   Report Status 11/23/2015 FINAL  Final   Organism ID, Bacteria SERRATIA MARCESCENS  Final      Susceptibility   Serratia marcescens - MIC*    CEFAZOLIN >=64 RESISTANT Resistant     CEFEPIME <=1 SENSITIVE Sensitive     CEFTAZIDIME <=1 SENSITIVE Sensitive     CEFTRIAXONE <=1 SENSITIVE Sensitive     CIPROFLOXACIN 1 SENSITIVE Sensitive     GENTAMICIN <=1 SENSITIVE Sensitive     TRIMETH/SULFA <=20 SENSITIVE Sensitive     * FEW SERRATIA MARCESCENS  Culture, blood (routine x 2)     Status: None (Preliminary result)   Collection Time: 11/21/15  4:58 PM  Result Value Ref Range Status     Specimen Description BLOOD LEFT ANTECUBITAL  Final   Special Requests BOTTLES DRAWN AEROBIC AND ANAEROBIC 10CC  Final   Culture NO GROWTH 2 DAYS  Final   Report Status PENDING  Incomplete  Culture, blood (routine x 2)     Status: None (Preliminary result)   Collection Time: 11/21/15  5:01 PM  Result Value Ref Range Status   Specimen Description BLOOD RIGHT ANTECUBITAL  Final   Special Requests BOTTLES DRAWN AEROBIC AND ANAEROBIC 10CC  Final   Culture NO GROWTH 2 DAYS  Final   Report Status PENDING  Incomplete     ASSESSMENT: He is persistently infected with Serratia at his driveline exit site. I'm not sure that is curable as long as no light in but I would recommend changing to oral ciprofloxacin 500 mg 3 times a day in treating him for 4 weeks total. He relapses shortly after completing therapy his options will be very limited.  PLAN: 1. Discharge home on ciprofloxacin 500 mg by mouth twice a day for 4 weeks 2. I will sign off now but please call if I can be of further assistance  Cliffton Asters, MD [facility] [phone] pager   [phone] cell 11/24/2015, 10:16 AM

## 2015-11-24 NOTE — Progress Notes (Signed)
Patient ID: Robert [name], male   DOB: [birth year], 60 y.o.   MRN: [id number] HeartMate 2 Rounding Note  Subjective:    He is on IV ceftriaxone.  Wound cultures with few Serratia pending sensitivities.  Blood cultures no growth so far.  Less drainage from driveline site, he says it looks better than when admitted.   Yesterday, doxazosin, amlodipine, hydralazine stopped with low MAP. Apparently he wasn't taking hydralazine or doxazosin.   Wants to go home. Denies SOB     LVAD INTERROGATION:  HeartMate II LVAD:  Flow 5.5 liters/min, speed 9400, power 6, PI 5.7.   Less 10 PI events a day.   Objective:    Vital Signs:   Temp:  [98 F (36.7 C)] 98 F (36.7 C) (08/21 0446) Pulse Rate:  [76-103] 76 (08/21 0545) Resp:  [18] 18 (08/21 0446) SpO2:  [97 %-100 %] 98 % (08/21 0545) Weight:  [250 lb 8 oz (113.6 kg)] 250 lb 8 oz (113.6 kg) (08/21 0446) Last BM Date: 11/23/15 Mean arterial Pressure 80-90s   Intake/Output:   Intake/Output Summary (Last 24 hours) at 11/24/15 0913 Last data filed at 11/23/15 1848  Gross per 24 hour  Intake              240 ml  Output                0 ml  Net              240 ml     Physical Exam: General:  Well appearing. No resp difficulty. In bed.  HEENT: normal Neck: supple. JVP not elevated. Carotids 2+ bilat; no bruits. No lymphadenopathy or thryomegaly appreciated. Cor: Mechanical heart sounds with LVAD hum present. Lungs: clear Abdomen: soft, nontender, nondistended. No hepatosplenomegaly. No bruits or masses. Good bowel sounds. Driveline: Mild erythema at exit site with minimal drainage; securement device intact and driveline incorporated Extremities: no cyanosis, clubbing, rash, edema Neuro: alert & orientedx3, cranial nerves grossly intact. moves all 4 extremities w/o difficulty. Affect pleasant  Telemetry: NSR  Labs: Basic Metabolic Panel:  Recent Labs Lab 11/22/15 0258 11/23/15 0301 11/24/15 0323  NA 138 138 138  K 3.5 3.5 3.7  CL  105 104 106  CO2 24 24 26   GLUCOSE 99 92 94  BUN 10 11 10   CREATININE 0.94 1.08 1.08  CALCIUM 8.7* 8.8* 8.5*    Liver Function Tests: No results for input(s): AST, ALT, ALKPHOS, BILITOT, PROT, ALBUMIN in the last 168 hours. No results for input(s): LIPASE, AMYLASE in the last 168 hours. No results for input(s): AMMONIA in the last 168 hours.  CBC:  Recent Labs Lab 11/22/15 0258 11/23/15 0301 11/24/15 0323  WBC 7.4 8.2 6.8  HGB 14.0 12.4* 12.8*  HCT 43.4 38.8* 39.5  MCV 88.2 87.2 88.2  PLT 158 140* 145*    INR:  Recent Labs Lab 11/21/15 1657 11/22/15 0258 11/23/15 0301 11/24/15 0323  INR 1.99 2.19 2.89 2.70    Other results:  EKG:   Imaging: No results found.   Medications:     Scheduled Medications: . allopurinol  300 mg Oral Daily  . aspirin EC  325 mg Oral Daily  . cefTRIAXone (ROCEPHIN)  IV  2 g Intravenous Q24H  . mexiletine  150 mg Oral BID  . multivitamin with minerals  1 tablet Oral Daily  . pantoprazole  40 mg Oral Daily  . sacubitril-valsartan  1 tablet Oral BID  . Warfarin - Pharmacist Dosing Inpatient  Does not apply q1800    Infusions:    PRN Medications: traMADol   Assessment:   1. Chronic systolic HF s/p HMII LVAD implant 05/2012. - NYHA II 2. Driveline infection 3. Palpitations 4. Hyperthyroidism 5. Chronic anticoagulation with coumadin  Plan/Discussion:    Admitted from clinic with ongoing driveline exit site infection despite treatment with oral Cipro for prior Serratia infection from 6/17.   Wound culture 11/21/15 with few Serratia. Sensitive to ceftriaxone. Blood cultures negative so far. ID following closely.   INR therapeutic. INR 2.7     MAP 80-90s off hydralazine, doxazosin, and amlodipine- Apparently he wasn't taking hydralalzine and doxazosin. Continue entresto and add 5 mg amlodipine daily.      Possible d/c once antibiotic course finalized.    I reviewed the LVAD parameters from today, and compared the  results to the patient's prior recorded data.  No programming changes were made.  The LVAD is functioning within specified parameters.  The patient performs LVAD self-test daily.  LVAD interrogation was negative for any significant power changes, alarms or PI events/speed drops.  LVAD equipment check completed and is in good working order.  Back-up equipment present.   LVAD education done on emergency procedures and precautions and reviewed exit site care.  Length of Stay: 3  Amy Clegg NP-C  11/24/2015, 9:13 AM  VAD Team --- VAD ISSUES ONLY--- Pager [phone] (7am - 7am)  Advanced Heart Failure Team  Pager [phone] (M-F; 7a - 4p)  Please contact [facility] for night-coverage after hours (4p -7a ) and weekends on [email or web address]  Patient seen and examined with Tonye BecketAmy Clegg, NP. We discussed all aspects of the encounter. I agree with the assessment and plan as stated above.   Wound site improved. Wound cx + serratia. Bcx negative. ID recommends switch to po abx. Will make switch and plan d/c home today. Will d/w [facility] whether this can help expedite transplant timeline. Treat HTN as above. VAD parameters ok.   Merl Bommarito,MD 10:08 PM

## 2015-11-24 NOTE — Discharge Summary (Signed)
Advanced Heart Failure Team  Discharge Summary   Patient ID: DEMENTRIUS ORZEL, MRN: [id number], DOB[birth year]AGE: [birth year] MRN: [id number], DOB/AGE: [birth year] 60 y.o. Admit date: 11/21/2015 D/C date:     11/24/2015   Primary Discharge Diagnoses:  1. Chronic systolic HF s/p HMII LVAD implant 05/2012. - NYHA II 2. 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 On cipro 500 mg twice daily x 30 days.  3. Palpitations 4. Hyperthyroidism 5. Chronic anticoagulation with coumadin  Hospital Course:  Mr. 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 status post dual chamber Medtronic ICD implanted April 2011. Cath 2011 showed normal coronaries. He also has a history of VTach . Currently listed as 1b list for heart tx at [facility]. Dual listed at [facility] and [facility].   In May 2017 and June 2017 he had two positive wound cultures (Serratia) from driveline. Treated with cipro both times.    Admitted with LVAD drive line infection. Prior to admit he had increased exudate and erythema noted around the drive line. Blood and wound cultures obtained. ID consulted and he was placed on ceftriaxone. Blood cultures remained negative. Wound culture showed recurrent serratia marcescens with sensitivity to cipro. He will continue cipro 500 mg twice daily x 30 days. Follow up with ID if has recurrence.   From HF perspective he was stable however MAPs did run low which was thought to be from HTN meds and required a fluid bolus.  HTN  meds adjusted and for now he will be on entresto and 5 mg amlodipine daily. Hydralazine and cardura both stopped as he was not taking these at home. Plan to adjust HTN meds as needed as an outpatient.   INR was therapeutic throughout his hospitalization. Plan for repeat INR on 8/24 and will follow up in the LVAD clinic next week.      LVAD INTERROGATION:  HeartMate II LVAD:  Flow 5.5 liters/min, speed 9400,  power 6, PI 5.7.   Less 10 PI events a day.   Discharge Weight Range: 250 pounds Discharge Vitals: Blood pressure (!) 81/51, pulse 76, temperature 98 F (36.7 C), temperature source Oral, resp. rate 18, weight 250 lb 8 oz (113.6 kg), SpO2 98 %.  Labs: Lab Results  Component Value Date   WBC 6.8 11/24/2015   HGB 12.8 (L) 11/24/2015   HCT 39.5 11/24/2015   MCV 88.2 11/24/2015   PLT 145 (L) 11/24/2015    Recent Labs Lab 11/24/15 0323  NA 138  K 3.7  CL 106  CO2 26  BUN 10  CREATININE 1.08  CALCIUM 8.5*  GLUCOSE 94   Lab Results  Component Value Date   CHOL 263 (H) 07/28/2015   HDL 60 07/28/2015   LDLCALC 176 (H) 07/28/2015   TRIG 134 07/28/2015   BNP (last 3 results)  Recent Labs  11/28/14 1032 06/19/15 1105  BNP 84.5 66.3    ProBNP (last 3 results) No results for input(s): PROBNP in the last 8760 hours.   Diagnostic Studies/Procedures   No results found.  Discharge Medications     Medication List    STOP taking these medications   doxazosin 8 MG tablet Commonly known as:  CARDURA   hydrALAZINE 100 MG tablet Commonly known as:  APRESOLINE     TAKE these medications   allopurinol 300 MG tablet Commonly known as:  ZYLOPRIM Take 1 tablet (300 mg total) by  mouth daily.   amLODipine 10 MG tablet Commonly known as:  NORVASC Take 0.5 tablets (5 mg total) by mouth daily. What changed:  how much to take   aspirin EC 325 MG tablet Take 1 tablet (325 mg total) by mouth daily.   ciprofloxacin 500 MG tablet Commonly known as:  CIPRO Take 1 tablet (500 mg total) by mouth 2 (two) times daily.   mexiletine 150 MG capsule Commonly known as:  MEXITIL Take 1 capsule (150 mg total) by mouth 2 (two) times daily.   multivitamin tablet Take 1 tablet by mouth daily.   pantoprazole 40 MG tablet Commonly known as:  PROTONIX TAKE 1 TABLET BY MOUTH EVERY DAY   sacubitril-valsartan 97-103 MG Commonly known as:  ENTRESTO Take 1 tablet by mouth 2 (two)  times daily.   traMADol 50 MG tablet Commonly known as:  ULTRAM Take 1 tablet (50 mg total) by mouth every 8 (eight) hours as needed for moderate pain.   warfarin 5 MG tablet Commonly known as:  COUMADIN Take 1 tablet (5 mg total) by mouth daily at 6 PM. TAKE AS DIRECTED BY VAD CLINIC What changed:  how much to take  how to take this  when to take this  Another medication with the same name was removed. Continue taking this medication, and follow the directions you see here.       Disposition   The patient will be discharged in stable condition to home. Discharge Instructions    Diet - low sodium heart healthy    Complete by:  As directed   Heart Failure patients record your daily weight using the same scale at the same time of day    Complete by:  As directed   Increase activity slowly    Complete by:  As directed     Follow-up Information    Arvilla MeresBensimhon, Demetrion Wesby, MD. Go on 12/01/2015.   Specialty:  Cardiology Why:  VAD Clinic Appointment Monday 12/01/15 at 11:30 Contact information: [address] [place] [address] [phone]        Willene Hatchetixon, Stephanie Nicole, RN .        VAD Clinic Lab Follow up on 11/27/2015.   Why:  9:30 to have INR drawn            Duration of Discharge Encounter: Greater than 35 minutes   Signed, Amy Clegg  11/24/2015, 10:01 AM  Patient seen and examined with Tonye BecketAmy Clegg, NP. We discussed all aspects of the encounter. I agree with the assessment and plan as stated above.   OK for d/c today with po abx and close f/u in VAD clinic.   Leandro Berkowitz,MD 10:09 PM

## 2015-11-26 LAB — CULTURE, BLOOD (ROUTINE X 2)
Culture: NO GROWTH
Culture: NO GROWTH

## 2015-11-27 ENCOUNTER — Ambulatory Visit (HOSPITAL_COMMUNITY)
Admit: 2015-11-27 | Discharge: 2015-11-27 | Disposition: A | Discharge status: Home / Self Care | Payer: Medicaid Other | Source: Ambulatory Visit | Attending: Internal Medicine | Admitting: Internal Medicine

## 2015-11-27 ENCOUNTER — Ambulatory Visit (HOSPITAL_COMMUNITY): Payer: Self-pay | Admitting: Unknown Physician Specialty

## 2015-11-27 DIAGNOSIS — Z5181 Encounter for therapeutic drug level monitoring: Secondary | ICD-10-CM | POA: Insufficient documentation

## 2015-11-27 DIAGNOSIS — Z7901 Long term (current) use of anticoagulants: Secondary | ICD-10-CM

## 2015-11-27 DIAGNOSIS — Z95811 Presence of heart assist device: Secondary | ICD-10-CM

## 2015-11-27 LAB — PROTIME-INR
INR: 1.71
PROTHROMBIN TIME: 20.3 s — AB (ref 11.4–15.2)

## 2015-12-01 ENCOUNTER — Telehealth (HOSPITAL_COMMUNITY): Payer: Self-pay | Admitting: Infectious Diseases

## 2015-12-01 ENCOUNTER — Inpatient Hospital Stay (HOSPITAL_COMMUNITY): Admit: 2015-12-01 | Payer: Medicaid Other

## 2015-12-01 ENCOUNTER — Other Ambulatory Visit (HOSPITAL_COMMUNITY): Payer: Self-pay | Admitting: Infectious Diseases

## 2015-12-01 DIAGNOSIS — Z95811 Presence of heart assist device: Secondary | ICD-10-CM

## 2015-12-01 DIAGNOSIS — Z7901 Long term (current) use of anticoagulants: Secondary | ICD-10-CM

## 2015-12-01 DIAGNOSIS — Z792 Long term (current) use of antibiotics: Secondary | ICD-10-CM

## 2015-12-01 NOTE — Telephone Encounter (Signed)
Called re: missed clinic appointment today. He was under the impression that his visit was Thursday. Will report to VAD Clinic Thursday. Requested to be here at 0900 instead of 930 since we have a patient scheduled next at 10:00. Verbalized he may be a few minutes late but that will be fine.

## 2015-12-04 ENCOUNTER — Ambulatory Visit (HOSPITAL_COMMUNITY)
Admission: RE | Admit: 2015-12-04 | Discharge: 2015-12-04 | Disposition: A | Discharge status: Home / Self Care | Payer: Medicaid Other | Source: Ambulatory Visit | Attending: Internal Medicine | Admitting: Internal Medicine

## 2015-12-04 ENCOUNTER — Ambulatory Visit (HOSPITAL_COMMUNITY): Payer: Self-pay | Admitting: Unknown Physician Specialty

## 2015-12-04 VITALS — BP 99/62 | HR 86 | Ht 74.0 in | Wt 254.8 lb

## 2015-12-04 DIAGNOSIS — Z95811 Presence of heart assist device: Secondary | ICD-10-CM

## 2015-12-04 DIAGNOSIS — I110 Hypertensive heart disease with heart failure: Secondary | ICD-10-CM | POA: Insufficient documentation

## 2015-12-04 DIAGNOSIS — I493 Ventricular premature depolarization: Secondary | ICD-10-CM | POA: Insufficient documentation

## 2015-12-04 DIAGNOSIS — E0580 Other thyrotoxicosis without thyrotoxic crisis or storm: Secondary | ICD-10-CM | POA: Insufficient documentation

## 2015-12-04 DIAGNOSIS — A498 Other bacterial infections of unspecified site: Secondary | ICD-10-CM

## 2015-12-04 DIAGNOSIS — Z7901 Long term (current) use of anticoagulants: Secondary | ICD-10-CM | POA: Diagnosis not present

## 2015-12-04 DIAGNOSIS — I5022 Chronic systolic (congestive) heart failure: Secondary | ICD-10-CM

## 2015-12-04 DIAGNOSIS — T462X5A Adverse effect of other antidysrhythmic drugs, initial encounter: Secondary | ICD-10-CM | POA: Insufficient documentation

## 2015-12-04 DIAGNOSIS — Z79899 Other long term (current) drug therapy: Secondary | ICD-10-CM | POA: Diagnosis not present

## 2015-12-04 DIAGNOSIS — Z7982 Long term (current) use of aspirin: Secondary | ICD-10-CM | POA: Diagnosis not present

## 2015-12-04 DIAGNOSIS — Z95810 Presence of automatic (implantable) cardiac defibrillator: Secondary | ICD-10-CM | POA: Insufficient documentation

## 2015-12-04 LAB — COMPREHENSIVE METABOLIC PANEL
ALK PHOS: 90 U/L (ref 38–126)
ALT: 15 U/L — AB (ref 17–63)
AST: 20 U/L (ref 15–41)
Albumin: 4 g/dL (ref 3.5–5.0)
Anion gap: 6 (ref 5–15)
BILIRUBIN TOTAL: 0.8 mg/dL (ref 0.3–1.2)
BUN: 13 mg/dL (ref 6–20)
CHLORIDE: 107 mmol/L (ref 101–111)
CO2: 26 mmol/L (ref 22–32)
CREATININE: 1.15 mg/dL (ref 0.61–1.24)
Calcium: 8.8 mg/dL — ABNORMAL LOW (ref 8.9–10.3)
GFR calc Af Amer: >60 mL/min (ref >60)
Glucose, Bld: 92 mg/dL (ref 65–99)
Potassium: 3.8 mmol/L (ref 3.5–5.1)
Sodium: 139 mmol/L (ref 135–145)
TOTAL PROTEIN: 6.7 g/dL (ref 6.5–8.1)

## 2015-12-04 LAB — CBC
HCT: 41.2 % (ref 39.0–52.0)
Hemoglobin: 13.4 g/dL (ref 13.0–17.0)
MCH: 28.6 pg (ref 26.0–34.0)
MCHC: 32.5 g/dL (ref 30.0–36.0)
MCV: 87.8 fL (ref 78.0–100.0)
PLATELETS: 167 10*3/uL (ref 150–400)
RBC: 4.69 MIL/uL (ref 4.22–5.81)
RDW: 14.7 % (ref 11.5–15.5)
WBC: 7.5 10*3/uL (ref 4.0–10.5)

## 2015-12-04 LAB — PROTIME-INR
INR: 1.84
Prothrombin Time: 21.5 seconds — ABNORMAL HIGH (ref 11.4–15.2)

## 2015-12-04 LAB — PREALBUMIN: Prealbumin: 23.2 mg/dL (ref 18–38)

## 2015-12-04 LAB — LACTATE DEHYDROGENASE: LDH: 275 U/L — ABNORMAL HIGH (ref 98–192)

## 2015-12-04 MED ORDER — TRAMADOL HCL 50 MG PO TABS: 50.0000 mg | ORAL_TABLET | Freq: Three times a day (TID) | ORAL | 5 refills | Status: DC | PRN

## 2015-12-04 NOTE — Progress Notes (Signed)
Symptom  Yes  No  Details   Angina       x Activity:   Claudication       x How far:   Syncope       x When:  Stroke       x   Orthopnea       x How many pillows:  1 pillow  PND       x How often:  CPAP       N/A How many hrs:   Pedal edema  x      1+ trace  Abd fullness       x   N&V       x good appetite  Diaphoresis       x When:  Bleeding      x   Urine color    light yellow  SOB       x Activity:   Palpitations        x      When:sometimes  ICD shock       x   Hospitlizaitons       x When/where/why:  ED visit       x When/where/why:  Other MD       x When/who/why:    Activity     x         Fluid  x  No limitations  Diet  x  No limitations   Vital signs: HR: 86 MAP BP: 108 modified systolic Auto cuff: 99/62 (76) O2 Sat: 97% Wt: 254.8 lbs Last wt: 252.8  lbs Ht:  6'2"  LVAD interrogation reveals:  Speed:  9400 Flow: 5.0 Power: 5.7w PI: 7 Alarms:  none Events: 3-10 daily; outlier on  7-19=30+ PI events- Pt was in hospital and had an episode of low BP this day.  Fixed speed:  9400 Low speed limit:  8800  11V battery status: Primary controller: replace 21 months             Secondary controller: replace 10 months   LVAD exit site:  Well healed and partially incorporated. The velour is fully implanted at exit site. Dressing dry and intact. No erythema, slight amount of serous drainage. Stabilization device present and accurately applied. Driveline dressing is being changed daily per sterile technique using split  gauze dressing. Pt does self dressing changes. Pt denies fever or chills.  Pt denies any alarms or VAD equipment issues. Pt is requesting aquaseal strips for his driveline site. Pt was instructed to do daily dressing changes with aquaseal strip. Pt instructed to notify us if he has increase drainage, redness, tenderness or odor from the site.   Encounter Details: Pt is feeling great since being discharged from the hospital. Pt states that he is doing daily  dressing changes.   Pt supplied with 10 daily dressings and 8 anchors. Tramadol refill provided per Dr. Gala Romney.   INR: 1.92 see anticoag note.   3.5 year Intermacs follow up completed including:  Quality of Life, KCCQ-12, and Neurocognitive trail making.   Pt completed 1350 feet during 6 minute walk.  Back up controller: not present. Pt left his backup bag in the car. Pt instructed to bring in back up bag next visit.    Pt instructions: 1. Return to clinic for an INR check in 1 week. 2. Return to clinic for visit in 1 month. 3. Please call if drainage increases or there is any redness, swelling or odor  from driveline site.   Robert AdamSarah Jolyne Laye, RN VAD Coordinator   Office: [phone] 24/7 VAD Pager: [phone]

## 2015-12-05 NOTE — Progress Notes (Signed)
Patient ID: SAMUELL KNOBLE, male   DOB: 07-17-1955, 60 y.o.   MRN: [id number]  LVAD CLINIC NOTE  Patient ID: WALI REINHEIMER, male   DOB: 09/30/1955, 60 y.o.   MRN: [id number] PCP: N/A HF: Kanye Depree    HPI: Mr. 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 status post dual chamber Medtronic ICD implanted April 2011. Cath 2011 showed normal coronaries. He also has a history of VTach . Currently listed as 1b list for heart tx at [facility].   Had Ong on 11/04/14 as part of dual listing process at [facility]. Normal filling pressures and cardiac output. Amio recently stopped due to thyrotoxicity. Placed on prednisone and tapazole. Had VT off amio and mexilitene started.  He is here for post hospital f f/u.  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 Back on cipro x 4 weeks. Following with ID. Driveline site improved. No fevers/chills. Otherwise feels well .No SOB, CP, edema. 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 Dual listed at [facility] and [facility].  BP much improved on Entresto.   Recent RHC on 11/06/15 as part of Transplant process  RA = 6 RV =  18/3/6 PA = 19/7 (12) PCW = 8 Fick cardiac output/index = 6.1/2.6 PVR = .0.7 WU Ao sat = 98% PA sat = 70%, 69%:   Reports taking Coumadin as prescribed and adherence to anticoagulation based dietary restrictions.  Denies bright red blood per rectum or melena, no dark urine or hematuria.    LVAD interrogation reveals:  Speed:  9400 Flow: 5.0 Power: 5.7w PI: 7 Alarms:  none Events: 3-10 daily; outlier on  7-19=30+ PI events- Pt was in hospital and had an episode of low BP this day.  Fixed speed:  9400 Low speed limit:  8800  11V battery status: Primary controller: replace 21 months                                                       Secondary controller: replace 10 months  I reviewed the LVAD  parameters from today, and compared the results to the patient's prior recorded data.  No programming changes were made.  The LVAD is functioning within specified parameters.  The patient performs LVAD self-test daily.  LVAD interrogation was negative for any significant power changes, alarms or PI events/speed drops.  LVAD equipment check completed and is in good working order.  Back-up equipment present.   LVAD education done on emergency procedures and precautions and reviewed exit site care.    Past Medical History:  Diagnosis Date  . AICD (automatic cardioverter/defibrillator) present 2011  . Bronchitis   . Congestive heart failure ([place]) 2011  . Hypertension 2000  . LVAD (left ventricular assist device) present ([place]) 2014  . Pneumonia   . Pulmonary hypertension ([place])     Current Outpatient Prescriptions  Medication Sig Dispense Refill  . allopurinol (ZYLOPRIM) 300 MG tablet Take 1 tablet (300 mg total) by mouth daily. 30 tablet 6  . amLODipine (NORVASC) 10 MG tablet Take 0.5 tablets (5 mg total) by mouth daily. 90 tablet 3  . aspirin EC 325 MG tablet Take 1 tablet (325 mg total) by mouth daily. [place]  tablet 6  . ciprofloxacin (CIPRO) 500 MG tablet Take 1 tablet (500 mg total) by mouth 2 (two) times daily. 60 tablet 0  . mexiletine (MEXITIL) 150 MG capsule Take 1 capsule (150 mg total) by mouth 2 (two) times daily. 60 capsule 11  . Multiple Vitamin (MULTIVITAMIN) tablet Take 1 tablet by mouth daily.    . pantoprazole (PROTONIX) 40 MG tablet TAKE 1 TABLET BY MOUTH EVERY DAY 90 tablet 3  . sacubitril-valsartan (ENTRESTO) 97-103 MG Take 1 tablet by mouth 2 (two) times daily. 60 tablet 6  . traMADol (ULTRAM) 50 MG tablet Take 1 tablet (50 mg total) by mouth every 8 (eight) hours as needed for moderate pain. 30 tablet 5  . warfarin (COUMADIN) 5 MG tablet Take 1 tablet (5 mg total) by mouth daily at 6 PM. TAKE AS DIRECTED BY VAD CLINIC 60 tablet 6   No current facility-administered medications  for this encounter.     Imdur [isosorbide nitrate] and Metoprolol  REVIEW OF SYSTEMS: All systems negative except as listed in HPI, PMH and Problem list.  Vital signs: HR: 86 MAP BP: 801 modified systolic Auto cuff: 65/53 (76) O2 Sat: 97% Wt: 254.8 lbs Last wt: 252.8  lbs Ht:  6'2"   Physical Exam: GENERAL: Well appearing, male who presents to clinic today in no acute distress. HEENT: normal  NECK: Supple, JVP flat;  2+ bilaterally, no bruits.  No lymphadenopathy or thyromegaly appreciated.   CARDIAC:  Mechanical heart sounds with LVAD hum present.  LUNGS:  Clear to auscultation bilaterally.  ABDOMEN:  Soft, round, nontender, positive bowel sounds x4.     LVAD exit site: well-healed and incorporated.  Dressing dry and intact.  No erythema or drainage.  Stabilization device present and accurately applied.  Driveline dressing is being changed daily per sterile technique .Driveline itself with multiple twists EXTREMITIES:  Warm and dry, no cyanosis, clubbing, rash or edema  L shoulder sore limited ROM NEUROLOGIC:  Alert and oriented x 4.  Gait steady.  No aphasia.  No dysarthria.  Affect pleasant.     Recent Results (from the past 2160 hour(s))  Protime-INR     Status: Abnormal   Collection Time: 09/19/15 11:00 AM  Result Value Ref Range   Prothrombin Time 25.3 (H) 11.6 - 15.2 seconds   INR 2.33 (H) 0.00 - 1.49  ABO/Rh     Status: None   Collection Time: 09/29/15 11:41 AM  Result Value Ref Range   ABO/RH(D) O POS   Hepatic function panel     Status: Abnormal   Collection Time: 09/29/15 12:00 PM  Result Value Ref Range   Total Protein 6.7 6.5 - 8.1 g/dL   Albumin 3.7 3.5 - 5.0 g/dL   AST 18 15 - 41 U/L   ALT 14 (L) 17 - 63 U/L   Alkaline Phosphatase 78 38 - 126 U/L   Total Bilirubin 0.8 0.3 - 1.2 mg/dL   Bilirubin, Direct <0.1 (L) 0.1 - 0.5 mg/dL   Indirect Bilirubin NOT CALCULATED 0.3 - 0.9 mg/dL  Rapid HIV screen (HIV 1/2 Ab+Ag)     Status: None   Collection Time:  09/29/15 12:00 PM  Result Value Ref Range   HIV-1 P24 Antigen - HIV24 NON REACTIVE NON REACTIVE   HIV 1/2 Antibodies NON REACTIVE NON REACTIVE   Interpretation (HIV Ag Ab)      A non reactive test result means that HIV 1 or HIV 2 antibodies and HIV 1 p24 antigen were not  detected in the specimen.  HIV-1 DNA qualitative by PCR, blood     Status: None   Collection Time: 09/29/15 12:00 PM  Result Value Ref Range   HIV-1 RNA, Qualitative, TMA Negative Negative    Comment: (NOTE) Negative for HIV-1 RNA Performed At: [facility] [place], [place] [id number] Lindon Romp MD OI:[id number]   Cmv antibody, IgG (EIA)     Status: Abnormal   Collection Time: 09/29/15 12:00 PM  Result Value Ref Range   CMV Ab - IgG 3.60 (H) 0.00 - 0.59 U/mL    Comment: (NOTE)                               Negative          <0.60                               Equivocal   0.60 - 0.69                               Positive          >0.69 Performed At: [facility] [place], [place] [id number] Lindon Romp MD QH:[id number]   CMV IgM     Status: None   Collection Time: 09/29/15 12:00 PM  Result Value Ref Range   CMV IgM <30.0 0.0 - 29.9 AU/mL    Comment: (NOTE)                                Negative         <30.0                                Equivocal  30.0 - 34.9                                Positive         >34.9 A positive result is generally indicative of acute infection, reactivation or persistent IgM production. Performed At: [facility] [address] [place], [place] [id number] Lindon Romp MD TZ:[id number]   Varicella zoster antibody, IgG     Status: None   Collection Time: 09/29/15 12:00 PM  Result Value Ref Range   Varicella IgG 2,535 Immune >165 index    Comment: (NOTE)                               Negative          <135                               Equivocal    135 - 165                               Positive           >165 A positive result generally indicates exposure to the pathogen or administration of specific immunoglobulins, but it is not indication of active infection  or stage of disease. Performed At: [facility] [place], [place] [id number] Lindon Romp MD BT:[id number]   HSV 1 antibody, IgG     Status: Abnormal   Collection Time: 09/29/15 12:00 PM  Result Value Ref Range   HSV 1 Glycoprotein G Ab, IgG 33.90 (H) 0.00 - 0.90 index    Comment: (NOTE)                                 Negative        <0.91                                 Equivocal 0.91 - 1.09                                 Positive        >1.09 Note: Negative indicates no antibodies detected to HSV-1. Equivocal may suggest early infection.  If clinically appropriate, retest at later date. Positive indicates antibodies detected to HSV-1. Performed At: [facility] [place], [place] [id number] Lindon Romp MD IO:[id number]   Hepatitis B surface antibody     Status: Abnormal   Collection Time: 09/29/15 12:00 PM  Result Value Ref Range   Hepatitis B-Post <3.1 (L) Immunity>9.9 mIU/mL    Comment: (NOTE)  Status of Immunity                     Anti-HBs Level  ------------------                     -------------- Inconsistent with Immunity                   0.0 - 9.9 Consistent with Immunity                          >9.9 Performed At: [facility] [place], [place] [id number] Lindon Romp MD RC:[id number]   Hepatitis B surface antigen     Status: None   Collection Time: 09/29/15 12:00 PM  Result Value Ref Range   Hepatitis B Surface Ag Negative Negative    Comment: (NOTE) Performed At: [facility] [address] [place], [place] [id number] Lindon Romp MD OE:[id number]   Hepatitis B core antibody, total     Status: None   Collection Time: 09/29/15 12:00 PM  Result Value Ref Range   Hep B Core Total Ab Negative Negative     Comment: (NOTE) Performed At: [facility] [address] [place], [place] [id number] Lindon Romp MD PP:[id number]   Hepatitis C Antibody     Status: None   Collection Time: 09/29/15 12:00 PM  Result Value Ref Range   HCV Ab <0.1 0.0 - 0.9 s/co ratio    Comment: (NOTE)                                  Negative:     < 0.8                             Indeterminate: 0.8 - 0.9  Positive:     > 0.9 The CDC recommends that a positive HCV antibody result be followed up with a HCV Nucleic Acid Amplification test (826415). Performed At: [facility] [place], [place] [id number] Lindon Romp MD GS:[id number]   Epstein-Barr Virus VCA Antibody Panel     Status: Abnormal   Collection Time: 09/29/15 12:00 PM  Result Value Ref Range   EBV VCA IgG <18.0 0.0 - 17.9 U/mL    Comment: (NOTE)                                 Negative        <18.0                                 Equivocal 18.0 - 21.9                                 Positive        >21.9    EBV VCA IgM <36.0 0.0 - 35.9 U/mL    Comment: (NOTE)                                 Negative        <36.0                                 Equivocal 36.0 - 43.9                                 Positive        >43.9    EBV NA IgG >600.0 (H) 0.0 - 17.9 U/mL    Comment: (NOTE)                                 Negative        <18.0                                 Equivocal 18.0 - 21.9                                 Positive        >21.9    EBV Early Antigen Ab, IgG <9.0 0.0 - 8.9 U/mL    Comment: (NOTE)                                 Negative        < 9.0                                 Equivocal  9.0 - 10.9                                 Positive        >  10.9    Interpretation: Comment     Comment: (NOTE)               EBV Interpretation Chart Interpretation   EBV-IgM  EA(D)-IgG  VCA-IgG  EBNA-IgG EBV Seronegative    -        -         -          - Early Phase          +        -         -          - Acute Primary       +       +or-       +          - Infection Convalescence/Past  -       +or-       +          + Infection Reactivated        +or-      +         +          + Infection       + Antibody Present      - Antibody Absent Performed At: [facility] [place], [place] [id number] Lindon Romp MD OI:[id number]   Toxoplasma gondii antibody, IgG     Status: None   Collection Time: 09/29/15 12:00 PM  Result Value Ref Range   Toxoplasma IgG Ratio <3.0 0.0 - 7.1 IU/mL    Comment: (NOTE)                                 Negative        <7.2                                 Equivocal  7.2 - 8.7                                 Positive        >8.7 Performed At: [facility] [address] [place], [place] [id number] Lindon Romp MD KF:[id number]   Toxoplasma gondii antibody, IgM     Status: None   Collection Time: 09/29/15 12:00 PM  Result Value Ref Range   Toxoplasma Antibody- IgM <3.0 0.0 - 7.9 AU/mL    Comment: (NOTE)                             Negative            <8.0                             Equivocal      8.0 - 9.9                             Positive            >9.9 Performed At: [facility] [address] [place], [place] [id number] Lindon Romp MD QD:[id number]   Nicotine/cotinine metabolites     Status: None  Collection Time: 09/29/15 12:00 PM  Result Value Ref Range   Nicotine None Detected ng/mL    Comment: (NOTE) Nicotine levels greater than 2.0 are consistent with the use of tobacco or tobacco cessation products.    Cotinine None Detected ng/mL    Comment: (NOTE) Cotinine levels greater than 20.0 are consistent with the use of tobacco or tobacco cessation products. Performed At: [facility] [place], [place] [id number] Lindon Romp MD OF:[id number]   TSH     Status: None   Collection Time: 09/29/15 12:00 PM  Result Value Ref Range   TSH  2.253 0.350 - 4.500 uIU/mL  T4, free     Status: None   Collection Time: 09/29/15 12:00 PM  Result Value Ref Range   Free T4 0.88 0.61 - 1.12 ng/dL    Comment: (NOTE) Biotin ingestion may interfere with free T4 tests. If the results are inconsistent with the TSH level, previous test results, or the clinical presentation, then consider biotin interference. If needed, order repeat testing after stopping biotin.   T3, free     Status: None   Collection Time: 09/29/15 12:00 PM  Result Value Ref Range   T3, Free 2.2 2.0 - 4.4 pg/mL    Comment: (NOTE) Performed At: [facility] [address] [place], [place] [id number] Lindon Romp MD AX:[id number]   Comment     Status: None   Collection Time: 09/29/15 12:00 PM  Result Value Ref Range   Infect disease Ab comment 1 Comment     Comment: (NOTE) It is presumed the patient has not been infected with and is not undergoing an acute infection with Toxoplasma. If symptoms persist, submit a new specimen after three weeks. Performed At: [facility] [place], [place] [id number] Lindon Romp MD OP:[id number]   Urine rapid drug screen (hosp performed)     Status: None   Collection Time: 09/29/15 12:05 PM  Result Value Ref Range   Opiates NONE DETECTED NONE DETECTED   Cocaine NONE DETECTED NONE DETECTED   Benzodiazepines NONE DETECTED NONE DETECTED   Amphetamines NONE DETECTED NONE DETECTED   Tetrahydrocannabinol NONE DETECTED NONE DETECTED   Barbiturates NONE DETECTED NONE DETECTED    Comment:        DRUG SCREEN FOR MEDICAL PURPOSES ONLY.  IF CONFIRMATION IS NEEDED FOR ANY PURPOSE, NOTIFY LAB WITHIN 5 DAYS.        LOWEST DETECTABLE LIMITS FOR URINE DRUG SCREEN Drug Class       Cutoff (ng/mL) Amphetamine      1000 Barbiturate      200 Benzodiazepine   381 Tricyclics       771 Opiates          300 Cocaine          300 THC              50   CBC     Status: Abnormal   Collection Time:  09/29/15  1:10 PM  Result Value Ref Range   WBC 6.5 4.0 - 10.5 K/uL   RBC 4.86 4.22 - 5.81 MIL/uL   Hemoglobin 14.0 13.0 - 17.0 g/dL   HCT 42.3 39.0 - 52.0 %   MCV 87.0 78.0 - 100.0 fL   MCH 28.8 26.0 - 34.0 pg   MCHC 33.1 30.0 - 36.0 g/dL   RDW 14.2 11.5 - 15.5 %   Platelets 148 (L) 150 - 400 K/uL  Aerobic Culture (superficial specimen)  Status: None   Collection Time: 09/29/15  1:17 PM  Result Value Ref Range   Specimen Description WOUND    Special Requests DRIVE LINE EXIT SITE    Gram Stain      RARE WBC PRESENT,BOTH PMN AND MONONUCLEAR FEW GRAM VARIABLE ROD RARE GRAM POSITIVE COCCI IN PAIRS    Culture ABUNDANT SERRATIA MARCESCENS    Report Status 10/01/2015 FINAL    Organism ID, Bacteria SERRATIA MARCESCENS       Susceptibility   Serratia marcescens - MIC*    CEFAZOLIN >=64 RESISTANT Resistant     CEFEPIME <=1 SENSITIVE Sensitive     CEFTAZIDIME <=1 SENSITIVE Sensitive     CEFTRIAXONE <=1 SENSITIVE Sensitive     CIPROFLOXACIN 1 SENSITIVE Sensitive     GENTAMICIN <=1 SENSITIVE Sensitive     TRIMETH/SULFA <=20 SENSITIVE Sensitive     * ABUNDANT SERRATIA MARCESCENS  Protime-INR     Status: Abnormal   Collection Time: 10/06/15 11:53 AM  Result Value Ref Range   Prothrombin Time 32.8 (H) 11.6 - 15.2 seconds   INR 3.30 (H) 0.00 - 1.49  Protime-INR     Status: Abnormal   Collection Time: 10/20/15 12:30 PM  Result Value Ref Range   Prothrombin Time 22.8 (H) 11.6 - 15.2 seconds   INR 2.03 (H) 0.00 - 1.49  RPR     Status: None   Collection Time: 10/20/15 12:30 PM  Result Value Ref Range   RPR Ser Ql Non Reactive Non Reactive    Comment: (NOTE) Performed At: [facility] [place], [place] [id number] Lindon Romp MD IO:[id number]   Ethanol     Status: None   Collection Time: 10/20/15 12:30 PM  Result Value Ref Range   Alcohol, Ethyl (B) <5 <5 mg/dL    Comment:        LOWEST DETECTABLE LIMIT FOR SERUM ALCOHOL IS 5 mg/dL FOR MEDICAL  PURPOSES ONLY   Pulmonary function test     Status: None   Collection Time: 10/28/15  9:59 AM  Result Value Ref Range   FVC-Pre 3.26 L   FVC-%Pred-Pre 75 %   FVC-Post 3.34 L   FVC-%Pred-Post 77 %   FVC-%Change-Post 2 %   FEV1-Pre 2.60 L   FEV1-%Pred-Pre 77 %   FEV1-Post 2.80 L   FEV1-%Pred-Post 83 %   FEV1-%Change-Post 8 %   FEV6-Pre 3.25 L   FEV6-%Pred-Pre 78 %   FEV6-Post 3.34 L   FEV6-%Pred-Post 80 %   FEV6-%Change-Post 2 %   Pre FEV1/FVC ratio 80 %   FEV1FVC-%Pred-Pre 102 %   Post FEV1/FVC ratio 84 %   FEV1FVC-%Change-Post 5 %   Pre FEV6/FVC Ratio 100 %   FEV6FVC-%Pred-Pre 102 %   Post FEV6/FVC ratio 100 %   FEV6FVC-%Pred-Post 103 %   FEV6FVC-%Change-Post 0 %   FEF 25-75 Pre 2.69 L/sec   FEF2575-%Pred-Pre 86 %   FEF 25-75 Post 3.56 L/sec   FEF2575-%Pred-Post 114 %   FEF2575-%Change-Post 32 %   RV 1.74 L   RV % pred 73 %   TLC 6.46 L   TLC % pred 87 %   DLCO unc 18.64 ml/min/mmHg   DLCO unc % pred 53 %   DL/VA 3.69 ml/min/mmHg/L   DL/VA % pred 77 %  Basic metabolic panel     Status: None   Collection Time: 10/28/15 12:15 PM  Result Value Ref Range   Sodium 139 135 - 145 mmol/L   Potassium 3.7 3.5 -  5.1 mmol/L   Chloride 105 101 - 111 mmol/L   CO2 28 22 - 32 mmol/L   Glucose, Bld 91 65 - 99 mg/dL   BUN 11 6 - 20 mg/dL   Creatinine, Ser 1.11 0.61 - 1.24 mg/dL   Calcium 9.1 8.9 - 10.3 mg/dL   GFR calc non Af Amer >60 >60 mL/min   GFR calc Af Amer >60 >60 mL/min    Comment: (NOTE) The eGFR has been calculated using the CKD EPI equation. This calculation has not been validated in all clinical situations. eGFR's persistently <60 mL/min signify possible Chronic Kidney Disease.    Anion gap 6 5 - 15  CBC     Status: Abnormal   Collection Time: 10/28/15 12:15 PM  Result Value Ref Range   WBC 8.4 4.0 - 10.5 K/uL   RBC 4.86 4.22 - 5.81 MIL/uL   Hemoglobin 13.7 13.0 - 17.0 g/dL   HCT 42.4 39.0 - 52.0 %   MCV 87.2 78.0 - 100.0 fL   MCH 28.2 26.0 - 34.0 pg     MCHC 32.3 30.0 - 36.0 g/dL   RDW 14.6 11.5 - 15.5 %   Platelets 147 (L) 150 - 400 K/uL  Protime-INR     Status: Abnormal   Collection Time: 10/28/15 12:15 PM  Result Value Ref Range   Prothrombin Time 24.4 (H) 11.6 - 15.2 seconds   INR 2.22 (H) 0.00 - 1.49  Lactate dehydrogenase     Status: Abnormal   Collection Time: 10/28/15 12:15 PM  Result Value Ref Range   LDH 267 (H) 98 - 192 U/L  Protime-INR     Status: Abnormal   Collection Time: 11/06/15  6:06 AM  Result Value Ref Range   Prothrombin Time 27.2 (H) 11.4 - 15.2 seconds   INR 2.47   I-STAT 3, venous blood gas (G3P V)     Status: Abnormal   Collection Time: 11/06/15  8:21 AM  Result Value Ref Range   pH, Ven 7.377 (H) 7.250 - 7.300   pCO2, Ven 43.2 (L) 45.0 - 50.0 mmHg   pO2, Ven 38.0 31.0 - 45.0 mmHg   Bicarbonate 25.4 (H) 20.0 - 24.0 mEq/L   TCO2 27 0 - 100 mmol/L   O2 Saturation 70.0 %   Patient temperature HIDE    Sample type VENOUS    Comment NOTIFIED PHYSICIAN   I-STAT 3, venous blood gas (G3P V)     Status: Abnormal   Collection Time: 11/06/15  8:25 AM  Result Value Ref Range   pH, Ven 7.385 (H) 7.250 - 7.300   pCO2, Ven 41.5 (L) 45.0 - 50.0 mmHg   pO2, Ven 37.0 31.0 - 45.0 mmHg   Bicarbonate 24.9 (H) 20.0 - 24.0 mEq/L   TCO2 26 0 - 100 mmol/L   O2 Saturation 69.0 %   Patient temperature HIDE    Sample type VENOUS    Comment NOTIFIED PHYSICIAN   Aerobic Culture (superficial specimen)     Status: None   Collection Time: 11/20/15  3:30 PM  Result Value Ref Range   Specimen Description WOUND EXIT SITE    Special Requests DRIVE LINE EXIT SITE    Gram Stain      FEW WBC PRESENT, PREDOMINANTLY MONONUCLEAR NO ORGANISMS SEEN    Culture FEW SERRATIA MARCESCENS    Report Status 11/23/2015 FINAL    Organism ID, Bacteria SERRATIA MARCESCENS       Susceptibility   Serratia marcescens - MIC*  CEFAZOLIN >=64 RESISTANT Resistant     CEFEPIME <=1 SENSITIVE Sensitive     CEFTAZIDIME <=1 SENSITIVE Sensitive      CEFTRIAXONE <=1 SENSITIVE Sensitive     CIPROFLOXACIN 1 SENSITIVE Sensitive     GENTAMICIN <=1 SENSITIVE Sensitive     TRIMETH/SULFA <=20 SENSITIVE Sensitive     * FEW SERRATIA MARCESCENS  Protime-INR     Status: Abnormal   Collection Time: 11/21/15  4:57 PM  Result Value Ref Range   Prothrombin Time 22.9 (H) 11.4 - 15.2 seconds   INR 1.99   Lactate dehydrogenase     Status: Abnormal   Collection Time: 11/21/15  4:57 PM  Result Value Ref Range   LDH 283 (H) 98 - 192 U/L  Culture, blood (routine x 2)     Status: None   Collection Time: 11/21/15  4:58 PM  Result Value Ref Range   Specimen Description BLOOD LEFT ANTECUBITAL    Special Requests BOTTLES DRAWN AEROBIC AND ANAEROBIC 10CC    Culture NO GROWTH 5 DAYS    Report Status 11/26/2015 FINAL   Culture, blood (routine x 2)     Status: None   Collection Time: 11/21/15  5:01 PM  Result Value Ref Range   Specimen Description BLOOD RIGHT ANTECUBITAL    Special Requests BOTTLES DRAWN AEROBIC AND ANAEROBIC 10CC    Culture NO GROWTH 5 DAYS    Report Status 11/26/2015 FINAL   Lactate dehydrogenase     Status: Abnormal   Collection Time: 11/22/15  2:58 AM  Result Value Ref Range   LDH 283 (H) 98 - 192 U/L  Protime-INR     Status: Abnormal   Collection Time: 11/22/15  2:58 AM  Result Value Ref Range   Prothrombin Time 24.8 (H) 11.4 - 15.2 seconds   INR 4.09   Basic metabolic panel     Status: Abnormal   Collection Time: 11/22/15  2:58 AM  Result Value Ref Range   Sodium 138 135 - 145 mmol/L   Potassium 3.5 3.5 - 5.1 mmol/L   Chloride 105 101 - 111 mmol/L   CO2 24 22 - 32 mmol/L   Glucose, Bld 99 65 - 99 mg/dL   BUN 10 6 - 20 mg/dL   Creatinine, Ser 0.94 0.61 - 1.24 mg/dL   Calcium 8.7 (L) 8.9 - 10.3 mg/dL   GFR calc non Af Amer >60 >60 mL/min   GFR calc Af Amer >60 >60 mL/min    Comment: (NOTE) The eGFR has been calculated using the CKD EPI equation. This calculation has not been validated in all clinical situations. eGFR's  persistently <60 mL/min signify possible Chronic Kidney Disease.    Anion gap 9 5 - 15  CBC     Status: None   Collection Time: 11/22/15  2:58 AM  Result Value Ref Range   WBC 7.4 4.0 - 10.5 K/uL   RBC 4.92 4.22 - 5.81 MIL/uL   Hemoglobin 14.0 13.0 - 17.0 g/dL   HCT 43.4 39.0 - 52.0 %   MCV 88.2 78.0 - 100.0 fL   MCH 28.5 26.0 - 34.0 pg   MCHC 32.3 30.0 - 36.0 g/dL   RDW 14.5 11.5 - 15.5 %   Platelets 158 150 - 400 K/uL  Lactate dehydrogenase     Status: Abnormal   Collection Time: 11/23/15  3:01 AM  Result Value Ref Range   LDH 231 (H) 98 - 192 U/L  Protime-INR     Status: Abnormal  Collection Time: 11/23/15  3:01 AM  Result Value Ref Range   Prothrombin Time 30.9 (H) 11.4 - 15.2 seconds   INR 1.32   Basic metabolic panel     Status: Abnormal   Collection Time: 11/23/15  3:01 AM  Result Value Ref Range   Sodium 138 135 - 145 mmol/L   Potassium 3.5 3.5 - 5.1 mmol/L   Chloride 104 101 - 111 mmol/L   CO2 24 22 - 32 mmol/L   Glucose, Bld 92 65 - 99 mg/dL   BUN 11 6 - 20 mg/dL   Creatinine, Ser 1.08 0.61 - 1.24 mg/dL   Calcium 8.8 (L) 8.9 - 10.3 mg/dL   GFR calc non Af Amer >60 >60 mL/min   GFR calc Af Amer >60 >60 mL/min    Comment: (NOTE) The eGFR has been calculated using the CKD EPI equation. This calculation has not been validated in all clinical situations. eGFR's persistently <60 mL/min signify possible Chronic Kidney Disease.    Anion gap 10 5 - 15  CBC     Status: Abnormal   Collection Time: 11/23/15  3:01 AM  Result Value Ref Range   WBC 8.2 4.0 - 10.5 K/uL   RBC 4.45 4.22 - 5.81 MIL/uL   Hemoglobin 12.4 (L) 13.0 - 17.0 g/dL   HCT 38.8 (L) 39.0 - 52.0 %   MCV 87.2 78.0 - 100.0 fL   MCH 27.9 26.0 - 34.0 pg   MCHC 32.0 30.0 - 36.0 g/dL   RDW 14.5 11.5 - 15.5 %   Platelets 140 (L) 150 - 400 K/uL  Lactate dehydrogenase     Status: Abnormal   Collection Time: 11/24/15  3:23 AM  Result Value Ref Range   LDH 225 (H) 98 - 192 U/L  Protime-INR     Status:  Abnormal   Collection Time: 11/24/15  3:23 AM  Result Value Ref Range   Prothrombin Time 29.2 (H) 11.4 - 15.2 seconds   INR 4.40   Basic metabolic panel     Status: Abnormal   Collection Time: 11/24/15  3:23 AM  Result Value Ref Range   Sodium 138 135 - 145 mmol/L   Potassium 3.7 3.5 - 5.1 mmol/L   Chloride 106 101 - 111 mmol/L   CO2 26 22 - 32 mmol/L   Glucose, Bld 94 65 - 99 mg/dL   BUN 10 6 - 20 mg/dL   Creatinine, Ser 1.08 0.61 - 1.24 mg/dL   Calcium 8.5 (L) 8.9 - 10.3 mg/dL   GFR calc non Af Amer >60 >60 mL/min   GFR calc Af Amer >60 >60 mL/min    Comment: (NOTE) The eGFR has been calculated using the CKD EPI equation. This calculation has not been validated in all clinical situations. eGFR's persistently <60 mL/min signify possible Chronic Kidney Disease.    Anion gap 6 5 - 15  CBC     Status: Abnormal   Collection Time: 11/24/15  3:23 AM  Result Value Ref Range   WBC 6.8 4.0 - 10.5 K/uL   RBC 4.48 4.22 - 5.81 MIL/uL   Hemoglobin 12.8 (L) 13.0 - 17.0 g/dL   HCT 39.5 39.0 - 52.0 %   MCV 88.2 78.0 - 100.0 fL   MCH 28.6 26.0 - 34.0 pg   MCHC 32.4 30.0 - 36.0 g/dL   RDW 14.6 11.5 - 15.5 %   Platelets 145 (L) 150 - 400 K/uL  Protime-INR     Status: Abnormal   Collection  Time: 11/27/15 11:00 AM  Result Value Ref Range   Prothrombin Time 20.3 (H) 11.4 - 15.2 seconds   INR 1.71   CBC     Status: None   Collection Time: 12/04/15 10:00 AM  Result Value Ref Range   WBC 7.5 4.0 - 10.5 K/uL   RBC 4.69 4.22 - 5.81 MIL/uL   Hemoglobin 13.4 13.0 - 17.0 g/dL   HCT 41.2 39.0 - 52.0 %   MCV 87.8 78.0 - 100.0 fL   MCH 28.6 26.0 - 34.0 pg   MCHC 32.5 30.0 - 36.0 g/dL   RDW 14.7 11.5 - 15.5 %   Platelets 167 150 - 400 K/uL  Protime-INR     Status: Abnormal   Collection Time: 12/04/15 10:00 AM  Result Value Ref Range   Prothrombin Time 21.5 (H) 11.4 - 15.2 seconds   INR 1.84   Lactate dehydrogenase     Status: Abnormal   Collection Time: 12/04/15 10:00 AM  Result Value Ref  Range   LDH 275 (H) 98 - 192 U/L  Prealbumin     Status: None   Collection Time: 12/04/15 10:00 AM  Result Value Ref Range   Prealbumin 23.2 18 - 38 mg/dL  Comprehensive metabolic panel     Status: Abnormal   Collection Time: 12/04/15 10:00 AM  Result Value Ref Range   Sodium 139 135 - 145 mmol/L   Potassium 3.8 3.5 - 5.1 mmol/L   Chloride 107 101 - 111 mmol/L   CO2 26 22 - 32 mmol/L   Glucose, Bld 92 65 - 99 mg/dL   BUN 13 6 - 20 mg/dL   Creatinine, Ser 1.15 0.61 - 1.24 mg/dL   Calcium 8.8 (L) 8.9 - 10.3 mg/dL   Total Protein 6.7 6.5 - 8.1 g/dL   Albumin 4.0 3.5 - 5.0 g/dL   AST 20 15 - 41 U/L   ALT 15 (L) 17 - 63 U/L   Alkaline Phosphatase 90 38 - 126 U/L   Total Bilirubin 0.8 0.3 - 1.2 mg/dL   GFR calc non Af Amer >60 >60 mL/min   GFR calc Af Amer >60 >60 mL/min    Comment: (NOTE) The eGFR has been calculated using the CKD EPI equation. This calculation has not been validated in all clinical situations. eGFR's persistently <60 mL/min signify possible Chronic Kidney Disease.    Anion gap 6 5 - 15    ASSESSMENT AND PLAN:   1) Chronic systolic HF, s/p HMII LVAD implant 05/2012.  - He is improved with VAD speed at 9600.  - Now dual listed at [facility] and [facility]. Recent RHC looks great.  - He is not on a BB. He was able to tolerate low-doseToprol before LVAD, however has been intolerant since that time  - Reinforced the need and importance of daily weights, a low sodium diet, and fluid restriction (less than 2 L a day). Instructed to call the HF clinic if weight increases more than 3 lbs overnight or 5 lbs in a week.  - Recent Driveline infection (see below) 2)  HTN-  - MAP  improved with Entresto 97/03 bid. But didn't take today. Also skipped hydralazine. BP up today. Discussed need for compliance. - Improved. 3) Hyperthyroidism - Amio induced. Follows with Dr. Forde Dandy. Amio stopped. Now on tapazole and prednisone.  4) NSVT/VT  - Off amio. On mexilitene. - ICD now at EOL. We  have decided to not replace given that risk of infection felt to be higher  than benefit or replacing as VT usually well tolerated in setting of VAD support 5) PVCs - Much improved with treatment of hyperthyroidism.  6) Anticoagulation with coumadin  - Continue coumadin and ASA 325 mg for LVAD. 7) LVAD  - Improved on 9600. Continue transplant process. Parameters stable.  8) Driveline infection, recurrent  --Wound cultures with Serratia. Bcx negative. Back on po cipro x 4 weeks. Following with ID --Discussed with [facility] regarding possible Ia time due to recurrent Driveline infection but in order to prioritize would require culture data and evidence of deep infection (need for debridement, etc).   Glori Bickers MD  12:07 AM

## 2015-12-10 ENCOUNTER — Other Ambulatory Visit (HOSPITAL_COMMUNITY): Payer: Self-pay | Admitting: *Deleted

## 2015-12-10 DIAGNOSIS — Z95811 Presence of heart assist device: Secondary | ICD-10-CM

## 2015-12-10 DIAGNOSIS — Z7901 Long term (current) use of anticoagulants: Secondary | ICD-10-CM

## 2015-12-11 ENCOUNTER — Ambulatory Visit (HOSPITAL_COMMUNITY): Payer: Self-pay | Admitting: Unknown Physician Specialty

## 2015-12-11 ENCOUNTER — Ambulatory Visit (HOSPITAL_COMMUNITY)
Admission: RE | Admit: 2015-12-11 | Discharge: 2015-12-11 | Disposition: A | Discharge status: Home / Self Care | Payer: Medicaid Other | Source: Ambulatory Visit | Attending: Cardiology | Admitting: Cardiology

## 2015-12-11 ENCOUNTER — Encounter (HOSPITAL_COMMUNITY): Payer: Self-pay | Admitting: Unknown Physician Specialty

## 2015-12-11 DIAGNOSIS — Z95811 Presence of heart assist device: Secondary | ICD-10-CM

## 2015-12-11 DIAGNOSIS — Z7901 Long term (current) use of anticoagulants: Secondary | ICD-10-CM | POA: Insufficient documentation

## 2015-12-11 LAB — PROTIME-INR
INR: 2.17
Prothrombin Time: 24.5 seconds — ABNORMAL HIGH (ref 11.4–15.2)

## 2015-12-17 ENCOUNTER — Other Ambulatory Visit (HOSPITAL_COMMUNITY): Payer: Self-pay | Admitting: Internal Medicine

## 2015-12-17 DIAGNOSIS — I493 Ventricular premature depolarization: Secondary | ICD-10-CM

## 2015-12-26 ENCOUNTER — Ambulatory Visit (HOSPITAL_COMMUNITY)
Admission: RE | Admit: 2015-12-26 | Discharge: 2015-12-26 | Disposition: A | Discharge status: Home / Self Care | Payer: Medicaid Other | Source: Ambulatory Visit | Attending: Cardiology | Admitting: Cardiology

## 2015-12-26 ENCOUNTER — Ambulatory Visit (HOSPITAL_COMMUNITY): Payer: Self-pay | Admitting: Unknown Physician Specialty

## 2015-12-26 DIAGNOSIS — Z95811 Presence of heart assist device: Secondary | ICD-10-CM

## 2015-12-26 DIAGNOSIS — Z7901 Long term (current) use of anticoagulants: Secondary | ICD-10-CM | POA: Insufficient documentation

## 2015-12-26 DIAGNOSIS — Z5181 Encounter for therapeutic drug level monitoring: Secondary | ICD-10-CM

## 2015-12-26 LAB — PROTIME-INR
INR: 2.83
Prothrombin Time: 30.4 seconds — ABNORMAL HIGH (ref 11.4–15.2)

## 2015-12-26 NOTE — Progress Notes (Signed)
Patient provided with 8 daily dressing kits and 8 anchors per his request today while having blood work drawn.

## 2015-12-26 NOTE — Addendum Note (Signed)
Encounter addended by: Blanchard Kelch, RN on: 12/26/2015  1:13 PM<BR>    Actions taken: Sign clinical note, Charge Capture section accepted

## 2015-12-29 ENCOUNTER — Encounter (HOSPITAL_COMMUNITY): Payer: Medicaid Other

## 2016-01-06 ENCOUNTER — Encounter (HOSPITAL_COMMUNITY): Payer: Medicaid Other

## 2016-01-07 ENCOUNTER — Other Ambulatory Visit (HOSPITAL_COMMUNITY): Payer: Self-pay | Admitting: *Deleted

## 2016-01-07 DIAGNOSIS — Z7901 Long term (current) use of anticoagulants: Secondary | ICD-10-CM

## 2016-01-07 DIAGNOSIS — Z95811 Presence of heart assist device: Secondary | ICD-10-CM

## 2016-01-08 ENCOUNTER — Ambulatory Visit (HOSPITAL_COMMUNITY): Payer: Self-pay | Admitting: *Deleted

## 2016-01-08 ENCOUNTER — Ambulatory Visit (HOSPITAL_COMMUNITY)
Admission: RE | Admit: 2016-01-08 | Discharge: 2016-01-08 | Disposition: A | Discharge status: Home / Self Care | Payer: Medicaid Other | Source: Ambulatory Visit | Attending: Internal Medicine | Admitting: Internal Medicine

## 2016-01-08 VITALS — BP 97/55 | HR 77 | Ht 74.0 in | Wt 252.4 lb

## 2016-01-08 DIAGNOSIS — I5022 Chronic systolic (congestive) heart failure: Secondary | ICD-10-CM | POA: Diagnosis not present

## 2016-01-08 DIAGNOSIS — Z7982 Long term (current) use of aspirin: Secondary | ICD-10-CM | POA: Insufficient documentation

## 2016-01-08 DIAGNOSIS — I428 Other cardiomyopathies: Secondary | ICD-10-CM | POA: Diagnosis not present

## 2016-01-08 DIAGNOSIS — I110 Hypertensive heart disease with heart failure: Secondary | ICD-10-CM | POA: Insufficient documentation

## 2016-01-08 DIAGNOSIS — Z7901 Long term (current) use of anticoagulants: Secondary | ICD-10-CM | POA: Diagnosis not present

## 2016-01-08 DIAGNOSIS — E0590 Thyrotoxicosis, unspecified without thyrotoxic crisis or storm: Secondary | ICD-10-CM

## 2016-01-08 DIAGNOSIS — Z95811 Presence of heart assist device: Secondary | ICD-10-CM

## 2016-01-08 DIAGNOSIS — Z79899 Other long term (current) drug therapy: Secondary | ICD-10-CM | POA: Diagnosis not present

## 2016-01-08 DIAGNOSIS — A498 Other bacterial infections of unspecified site: Secondary | ICD-10-CM

## 2016-01-08 DIAGNOSIS — I10 Essential (primary) hypertension: Secondary | ICD-10-CM

## 2016-01-08 DIAGNOSIS — I509 Heart failure, unspecified: Secondary | ICD-10-CM | POA: Diagnosis not present

## 2016-01-08 LAB — CBC
HEMATOCRIT: 44.3 % (ref 39.0–52.0)
HEMOGLOBIN: 14.6 g/dL (ref 13.0–17.0)
MCH: 28.6 pg (ref 26.0–34.0)
MCHC: 33 g/dL (ref 30.0–36.0)
MCV: 86.7 fL (ref 78.0–100.0)
Platelets: 174 10*3/uL (ref 150–400)
RBC: 5.11 MIL/uL (ref 4.22–5.81)
RDW: 14.6 % (ref 11.5–15.5)
WBC: 6.7 10*3/uL (ref 4.0–10.5)

## 2016-01-08 LAB — PROTIME-INR
INR: 2.48
Prothrombin Time: 27.3 seconds — ABNORMAL HIGH (ref 11.4–15.2)

## 2016-01-08 LAB — BASIC METABOLIC PANEL
ANION GAP: 7 (ref 5–15)
BUN: 7 mg/dL (ref 6–20)
CHLORIDE: 105 mmol/L (ref 101–111)
CO2: 26 mmol/L (ref 22–32)
Calcium: 9 mg/dL (ref 8.9–10.3)
Creatinine, Ser: 1.09 mg/dL (ref 0.61–1.24)
GFR calc Af Amer: >60 mL/min (ref >60)
Glucose, Bld: 90 mg/dL (ref 65–99)
POTASSIUM: 3.4 mmol/L — AB (ref 3.5–5.1)
SODIUM: 138 mmol/L (ref 135–145)

## 2016-01-08 LAB — LACTATE DEHYDROGENASE: LDH: 278 U/L — AB (ref 98–192)

## 2016-01-08 NOTE — Progress Notes (Signed)
Symptom  Yes  No  Details   Angina       x Activity:   Claudication       x How far:   Syncope       x When:  Stroke       x   Orthopnea       x How many pillows:  1 pillow  PND       x How often:  CPAP       N/A How many hrs:   Pedal edema       x   Abd fullness       x   N&V       x good appetite  Diaphoresis       x When:  Bleeding      x   Urine color    light yellow  SOB       x Activity:   Palpitations        x      When:sometimes  ICD shock       x   Hospitlizaitons       x When/where/why:  ED visit       x When/where/why:  Other MD       x When/who/why:    Activity     x         Fluid  x  No limitations  Diet  x  No limitations   Vital signs: HR: 77 MAP BP: 94 modified systolic Auto cuff:  97/55 (84) O2 Sat: 100% Wt:  252. 2  lbs Last wt: 254.8 lbs Ht:  6'2"  LVAD interrogation reveals:  Speed:  9400 Flow: 4.8 Power: 5.5w PI: 6.5 Alarms:  none Events: 5 - 10 PI daily with outlier on 9/25 with 60 PI events Fixed speed:  9400 Low speed limit:  8800  11V battery status: Primary controller: replace 19 months             Secondary controller: replace 18 months   LVAD exit site:  Well healed and partially incorporated. The velour is fully implanted at exit site. Dressing and Aquacel silver strip with light brown drainage, no erythema, tenderness, or foul odor noted. Proud flesh noted on underside of exit site. Tonye BecketAmy Clegg, NP in to examine; silver nitrate applicator applied to site to in effort to decrease drainage. Stabilization device present and accurately applied. Driveline dressing is being changed daily per sterile technique using split  gauze dressing and aquacel silver strip over exit site. Pt does self dressing changes after daily shower.  Pt denies fever or chills.  Pt denies any alarms or VAD equipment issues. Pt was instructed to continue daily dressing changes with aquaseal strip. Pt instructed to notify us if he has increase drainage, redness, tenderness  or odor from the site. Pt provided with 30 daily dressing kits, 10 anchors, and aquacel silver strips.   I reviewed the LVAD parameters from today, and compared the results to the patient's prior recorded data. No programming changes were made. The LVAD is functioning within specified parameters.  LVAD interrogation was negative for any significant power changes or alarms; increased # of PI events/speed drops noted on 12/29/15.    Pt denies any alarms or VAD equipment issues. Pt is performing daily controller and system monitor self tests along with completing weekly and monthly maintenance for LVAD equipment.   LVAD equipment check completed and is in good working order. Back-up equipment present. LVAD  education done on emergency procedures and precautions and reviewed exit site care.   Encounter Details: Pt is feeling good with no complaints.  History revealed increased # of PI events on 9/25, pt denies any symptoms during that day. Optivol obtained and reveiwed by Dr. Gala Romney - no atrial or ventricular arrhythmias noted. Pt states that he is doing daily dressing changes and will contact VAD clinic if any changes in exit site appearance of if he should have any fever/chills.   INR: 2.48 see anticoag note.    Pt instructions: 1.  No change in meds. 2.  Return to VAD clinic in 1 month.   Hessie Diener, RN VAD Coordinator  Office: [phone] 24/7 VAD Pager: [phone]

## 2016-01-09 NOTE — Progress Notes (Signed)
Patient ID: Robert [name], male   DOB: January 21, 1956, 60 y.o.   MRN: [id number]  LVAD CLINIC NOTE  Patient ID: Robert [name], male   DOB: Dec 11, 1955, 60 y.o.   MRN: [id number] PCP: N/A HF: Rishit Burkhalter    HPI: Mr. 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 status post dual chamber Medtronic ICD implanted April 2011. Cath 2011 showed normal coronaries. He also has a history of VTach . Currently listed as 1b list for heart tx at [facility].   Amio recently stopped due to thyrotoxicity. Placed on prednisone and tapazole. Had VT off amio and mexilitene started.  Had [place] on 8/17 as part of dual listing process at [facility]. Normal filling pressures and cardiac output.   Recently admitted in 8/17 for recurrent serratia driveline exit site infection. Back on cipro.  He is here for routine f/u. Feels well. Doing all activities without problems. Trace edema.   Driveline site improved but still with occasional drainage. No fevers/chills. 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 Dual listed at [facility] and [facility].  BP much improved on Entresto. On 9/25 had multiple PI events but denies palpitations, dehydration or bleeding.   Recent RHC on 11/06/15 as part of Transplant process  RA = 6 RV =  18/3/6 PA = 19/7 (12) PCW = 8 Fick cardiac output/index = 6.1/2.6 PVR = .0.7 WU Ao sat = 98% PA sat = 70%, 69%:   Reports taking Coumadin as prescribed and adherence to anticoagulation based dietary restrictions.  Denies bright red blood per rectum or melena, no dark urine or hematuria.    LVAD interrogation reveals:  Speed:  9400 Flow: 4.8 Power: 5.5w PI: 6.5 Alarms:  none Events: 5 - 10 PI daily with outlier on 9/25 with 60 PI events Fixed speed:  9400 Low speed limit:  8800  11V battery status: Primary controller: replace 19 months   Secondary controller: replace 18 months    I reviewed the LVAD parameters from today, and compared the results to the patient's prior recorded data.  No programming changes were made.  The LVAD is functioning within specified parameters.  The patient performs LVAD self-test daily.  LVAD interrogation was negative for any significant power changes, alarms or PI events/speed drops.  LVAD equipment check completed and is in good working order.  Back-up equipment present.   LVAD education done on emergency procedures and precautions and reviewed exit site care.    Past Medical History:  Diagnosis Date  . AICD (automatic cardioverter/defibrillator) present 2011  . Bronchitis   . Congestive heart failure ([place]) 2011  . Hypertension 2000  . LVAD (left ventricular assist device) present ([place]) 2014  . Pneumonia   . Pulmonary hypertension     Current Outpatient Prescriptions  Medication Sig Dispense Refill  . allopurinol (ZYLOPRIM) 300 MG tablet Take 1 tablet (300 mg total) by mouth daily. 30 tablet 6  . amLODipine (NORVASC) 10 MG tablet Take 0.5 tablets (5 mg total) by mouth daily. (Patient taking differently: Take 10 mg by mouth daily. ) 90 tablet 3  . aspirin EC 325 MG tablet Take 1 tablet (325 mg total) by mouth daily. 30 tablet 6  . mexiletine (MEXITIL) 150 MG capsule Take 1 capsule (150 mg total) by mouth 2 (two) times daily. 60 capsule 11  . Multiple Vitamin (MULTIVITAMIN) tablet Take  1 tablet by mouth daily.    . pantoprazole (PROTONIX) 40 MG tablet TAKE 1 TABLET BY MOUTH EVERY DAY 90 tablet 3  . sacubitril-valsartan (ENTRESTO) 97-103 MG Take 1 tablet by mouth 2 (two) times daily. 60 tablet 6  . warfarin (COUMADIN) 5 MG tablet Take 1 tablet (5 mg total) by mouth daily at 6 PM. TAKE AS DIRECTED BY VAD CLINIC 60 tablet 6  . traMADol (ULTRAM) 50 MG tablet Take 1 tablet (50 mg total) by mouth every 8 (eight) hours as needed for moderate pain. (Patient not taking: Reported on 01/08/2016) 30 tablet 5    No current facility-administered medications for this encounter.     Imdur [isosorbide nitrate] and Metoprolol  REVIEW OF SYSTEMS: All systems negative except as listed in HPI, PMH and Problem list.   Vital signs: HR: 77 MAP BP: 94 modified systolic Auto cuff:  92/42 (84) O2 Sat: 100% Wt:  252. 2  lbs Last wt: 254.8 lbs Ht:  6'2"    Physical Exam: GENERAL: Well appearing, male who presents to clinic today in no acute distress. HEENT: normal  NECK: Supple, JVP 7;  2+ bilaterally, no bruits.  No lymphadenopathy or thyromegaly appreciated.  CARDIAC:  Mechanical heart sounds with LVAD hum present.  LUNGS:  Clear to auscultation bilaterally.  ABDOMEN:  Soft, round, nontender, positive bowel sounds x4.     LVAD exit site: well-healed and incorporated.  Dressing dry and intact.  No erythema or drainage.  Stabilization device present and accurately applied.  Driveline dressing is being changed daily per sterile technique .Driveline itself with multiple twists EXTREMITIES:  Warm and dry, no cyanosis, clubbing, rash. trace edema   NEUROLOGIC:  Alert and oriented x 4.  Gait steady.  No aphasia.  No dysarthria.  Affect pleasant.     Recent Results (from the past 2160 hour(s))  Protime-INR     Status: Abnormal   Collection Time: 10/20/15 12:30 PM  Result Value Ref Range   Prothrombin Time 22.8 (H) 11.6 - 15.2 seconds   INR 2.03 (H) 0.00 - 1.49  RPR     Status: None   Collection Time: 10/20/15 12:30 PM  Result Value Ref Range   RPR Ser Ql Non Reactive Non Reactive    Comment: (NOTE) Performed At: [facility] [place], [place] [id number] Lindon Romp MD WL:[id number]   Ethanol     Status: None   Collection Time: 10/20/15 12:30 PM  Result Value Ref Range   Alcohol, Ethyl (B) <5 <5 mg/dL    Comment:        LOWEST DETECTABLE LIMIT FOR SERUM ALCOHOL IS 5 mg/dL FOR MEDICAL PURPOSES ONLY   Pulmonary function test     Status: None   Collection Time:  10/28/15  9:59 AM  Result Value Ref Range   FVC-Pre 3.26 L   FVC-%Pred-Pre 75 %   FVC-Post 3.34 L   FVC-%Pred-Post 77 %   FVC-%Change-Post 2 %   FEV1-Pre 2.60 L   FEV1-%Pred-Pre 77 %   FEV1-Post 2.80 L   FEV1-%Pred-Post 83 %   FEV1-%Change-Post 8 %   FEV6-Pre 3.25 L   FEV6-%Pred-Pre 78 %   FEV6-Post 3.34 L   FEV6-%Pred-Post 80 %   FEV6-%Change-Post 2 %   Pre FEV1/FVC ratio 80 %   FEV1FVC-%Pred-Pre 102 %   Post FEV1/FVC ratio 84 %   FEV1FVC-%Change-Post 5 %   Pre FEV6/FVC Ratio 100 %   FEV6FVC-%Pred-Pre 102 %   Post FEV6/FVC ratio  100 %   FEV6FVC-%Pred-Post 103 %   FEV6FVC-%Change-Post 0 %   FEF 25-75 Pre 2.69 L/sec   FEF2575-%Pred-Pre 86 %   FEF 25-75 Post 3.56 L/sec   FEF2575-%Pred-Post 114 %   FEF2575-%Change-Post 32 %   RV 1.74 L   RV % pred 73 %   TLC 6.46 L   TLC % pred 87 %   DLCO unc 18.64 ml/min/mmHg   DLCO unc % pred 53 %   DL/VA 3.69 ml/min/mmHg/L   DL/VA % pred 77 %  Basic metabolic panel     Status: None   Collection Time: 10/28/15 12:15 PM  Result Value Ref Range   Sodium 139 135 - 145 mmol/L   Potassium 3.7 3.5 - 5.1 mmol/L   Chloride 105 101 - 111 mmol/L   CO2 28 22 - 32 mmol/L   Glucose, Bld 91 65 - 99 mg/dL   BUN 11 6 - 20 mg/dL   Creatinine, Ser 1.11 0.61 - 1.24 mg/dL   Calcium 9.1 8.9 - 10.3 mg/dL   GFR calc non Af Amer >60 >60 mL/min   GFR calc Af Amer >60 >60 mL/min    Comment: (NOTE) The eGFR has been calculated using the CKD EPI equation. This calculation has not been validated in all clinical situations. eGFR's persistently <60 mL/min signify possible Chronic Kidney Disease.    Anion gap 6 5 - 15  CBC     Status: Abnormal   Collection Time: 10/28/15 12:15 PM  Result Value Ref Range   WBC 8.4 4.0 - 10.5 K/uL   RBC 4.86 4.22 - 5.81 MIL/uL   Hemoglobin 13.7 13.0 - 17.0 g/dL   HCT 42.4 39.0 - 52.0 %   MCV 87.2 78.0 - 100.0 fL   MCH 28.2 26.0 - 34.0 pg   MCHC 32.3 30.0 - 36.0 g/dL   RDW 14.6 11.5 - 15.5 %   Platelets 147 (L) 150  - 400 K/uL  Protime-INR     Status: Abnormal   Collection Time: 10/28/15 12:15 PM  Result Value Ref Range   Prothrombin Time 24.4 (H) 11.6 - 15.2 seconds   INR 2.22 (H) 0.00 - 1.49  Lactate dehydrogenase     Status: Abnormal   Collection Time: 10/28/15 12:15 PM  Result Value Ref Range   LDH 267 (H) 98 - 192 U/L  Protime-INR     Status: Abnormal   Collection Time: 11/06/15  6:06 AM  Result Value Ref Range   Prothrombin Time 27.2 (H) 11.4 - 15.2 seconds   INR 2.47   I-STAT 3, venous blood gas (G3P V)     Status: Abnormal   Collection Time: 11/06/15  8:21 AM  Result Value Ref Range   pH, Ven 7.377 (H) 7.250 - 7.300   pCO2, Ven 43.2 (L) 45.0 - 50.0 mmHg   pO2, Ven 38.0 31.0 - 45.0 mmHg   Bicarbonate 25.4 (H) 20.0 - 24.0 mEq/L   TCO2 27 0 - 100 mmol/L   O2 Saturation 70.0 %   Patient temperature HIDE    Sample type VENOUS    Comment NOTIFIED PHYSICIAN   I-STAT 3, venous blood gas (G3P V)     Status: Abnormal   Collection Time: 11/06/15  8:25 AM  Result Value Ref Range   pH, Ven 7.385 (H) 7.250 - 7.300   pCO2, Ven 41.5 (L) 45.0 - 50.0 mmHg   pO2, Ven 37.0 31.0 - 45.0 mmHg   Bicarbonate 24.9 (H) 20.0 - 24.0 mEq/L  TCO2 26 0 - 100 mmol/L   O2 Saturation 69.0 %   Patient temperature HIDE    Sample type VENOUS    Comment NOTIFIED PHYSICIAN   Aerobic Culture (superficial specimen)     Status: None   Collection Time: 11/20/15  3:30 PM  Result Value Ref Range   Specimen Description WOUND EXIT SITE    Special Requests DRIVE LINE EXIT SITE    Gram Stain      FEW WBC PRESENT, PREDOMINANTLY MONONUCLEAR NO ORGANISMS SEEN    Culture FEW SERRATIA MARCESCENS    Report Status 11/23/2015 FINAL    Organism ID, Bacteria SERRATIA MARCESCENS       Susceptibility   Serratia marcescens - MIC*    CEFAZOLIN >=64 RESISTANT Resistant     CEFEPIME <=1 SENSITIVE Sensitive     CEFTAZIDIME <=1 SENSITIVE Sensitive     CEFTRIAXONE <=1 SENSITIVE Sensitive     CIPROFLOXACIN 1 SENSITIVE Sensitive       GENTAMICIN <=1 SENSITIVE Sensitive     TRIMETH/SULFA <=20 SENSITIVE Sensitive     * FEW SERRATIA MARCESCENS  Protime-INR     Status: Abnormal   Collection Time: 11/21/15  4:57 PM  Result Value Ref Range   Prothrombin Time 22.9 (H) 11.4 - 15.2 seconds   INR 1.99   Lactate dehydrogenase     Status: Abnormal   Collection Time: 11/21/15  4:57 PM  Result Value Ref Range   LDH 283 (H) 98 - 192 U/L  Culture, blood (routine x 2)     Status: None   Collection Time: 11/21/15  4:58 PM  Result Value Ref Range   Specimen Description BLOOD LEFT ANTECUBITAL    Special Requests BOTTLES DRAWN AEROBIC AND ANAEROBIC 10CC    Culture NO GROWTH 5 DAYS    Report Status 11/26/2015 FINAL   Culture, blood (routine x 2)     Status: None   Collection Time: 11/21/15  5:01 PM  Result Value Ref Range   Specimen Description BLOOD RIGHT ANTECUBITAL    Special Requests BOTTLES DRAWN AEROBIC AND ANAEROBIC 10CC    Culture NO GROWTH 5 DAYS    Report Status 11/26/2015 FINAL   Lactate dehydrogenase     Status: Abnormal   Collection Time: 11/22/15  2:58 AM  Result Value Ref Range   LDH 283 (H) 98 - 192 U/L  Protime-INR     Status: Abnormal   Collection Time: 11/22/15  2:58 AM  Result Value Ref Range   Prothrombin Time 24.8 (H) 11.4 - 15.2 seconds   INR 7.37   Basic metabolic panel     Status: Abnormal   Collection Time: 11/22/15  2:58 AM  Result Value Ref Range   Sodium 138 135 - 145 mmol/L   Potassium 3.5 3.5 - 5.1 mmol/L   Chloride 105 101 - 111 mmol/L   CO2 24 22 - 32 mmol/L   Glucose, Bld 99 65 - 99 mg/dL   BUN 10 6 - 20 mg/dL   Creatinine, Ser 0.94 0.61 - 1.24 mg/dL   Calcium 8.7 (L) 8.9 - 10.3 mg/dL   GFR calc non Af Amer >60 >60 mL/min   GFR calc Af Amer >60 >60 mL/min    Comment: (NOTE) The eGFR has been calculated using the CKD EPI equation. This calculation has not been validated in all clinical situations. eGFR's persistently <60 mL/min signify possible Chronic Kidney Disease.    Anion  gap 9 5 - 15  CBC     Status:  None   Collection Time: 11/22/15  2:58 AM  Result Value Ref Range   WBC 7.4 4.0 - 10.5 K/uL   RBC 4.92 4.22 - 5.81 MIL/uL   Hemoglobin 14.0 13.0 - 17.0 g/dL   HCT 43.4 39.0 - 52.0 %   MCV 88.2 78.0 - 100.0 fL   MCH 28.5 26.0 - 34.0 pg   MCHC 32.3 30.0 - 36.0 g/dL   RDW 14.5 11.5 - 15.5 %   Platelets 158 150 - 400 K/uL  Lactate dehydrogenase     Status: Abnormal   Collection Time: 11/23/15  3:01 AM  Result Value Ref Range   LDH 231 (H) 98 - 192 U/L  Protime-INR     Status: Abnormal   Collection Time: 11/23/15  3:01 AM  Result Value Ref Range   Prothrombin Time 30.9 (H) 11.4 - 15.2 seconds   INR 8.78   Basic metabolic panel     Status: Abnormal   Collection Time: 11/23/15  3:01 AM  Result Value Ref Range   Sodium 138 135 - 145 mmol/L   Potassium 3.5 3.5 - 5.1 mmol/L   Chloride 104 101 - 111 mmol/L   CO2 24 22 - 32 mmol/L   Glucose, Bld 92 65 - 99 mg/dL   BUN 11 6 - 20 mg/dL   Creatinine, Ser 1.08 0.61 - 1.24 mg/dL   Calcium 8.8 (L) 8.9 - 10.3 mg/dL   GFR calc non Af Amer >60 >60 mL/min   GFR calc Af Amer >60 >60 mL/min    Comment: (NOTE) The eGFR has been calculated using the CKD EPI equation. This calculation has not been validated in all clinical situations. eGFR's persistently <60 mL/min signify possible Chronic Kidney Disease.    Anion gap 10 5 - 15  CBC     Status: Abnormal   Collection Time: 11/23/15  3:01 AM  Result Value Ref Range   WBC 8.2 4.0 - 10.5 K/uL   RBC 4.45 4.22 - 5.81 MIL/uL   Hemoglobin 12.4 (L) 13.0 - 17.0 g/dL   HCT 38.8 (L) 39.0 - 52.0 %   MCV 87.2 78.0 - 100.0 fL   MCH 27.9 26.0 - 34.0 pg   MCHC 32.0 30.0 - 36.0 g/dL   RDW 14.5 11.5 - 15.5 %   Platelets 140 (L) 150 - 400 K/uL  Lactate dehydrogenase     Status: Abnormal   Collection Time: 11/24/15  3:23 AM  Result Value Ref Range   LDH 225 (H) 98 - 192 U/L  Protime-INR     Status: Abnormal   Collection Time: 11/24/15  3:23 AM  Result Value Ref Range    Prothrombin Time 29.2 (H) 11.4 - 15.2 seconds   INR 6.76   Basic metabolic panel     Status: Abnormal   Collection Time: 11/24/15  3:23 AM  Result Value Ref Range   Sodium 138 135 - 145 mmol/L   Potassium 3.7 3.5 - 5.1 mmol/L   Chloride 106 101 - 111 mmol/L   CO2 26 22 - 32 mmol/L   Glucose, Bld 94 65 - 99 mg/dL   BUN 10 6 - 20 mg/dL   Creatinine, Ser 1.08 0.61 - 1.24 mg/dL   Calcium 8.5 (L) 8.9 - 10.3 mg/dL   GFR calc non Af Amer >60 >60 mL/min   GFR calc Af Amer >60 >60 mL/min    Comment: (NOTE) The eGFR has been calculated using the CKD EPI equation. This calculation has not been validated in  all clinical situations. eGFR's persistently <60 mL/min signify possible Chronic Kidney Disease.    Anion gap 6 5 - 15  CBC     Status: Abnormal   Collection Time: 11/24/15  3:23 AM  Result Value Ref Range   WBC 6.8 4.0 - 10.5 K/uL   RBC 4.48 4.22 - 5.81 MIL/uL   Hemoglobin 12.8 (L) 13.0 - 17.0 g/dL   HCT 39.5 39.0 - 52.0 %   MCV 88.2 78.0 - 100.0 fL   MCH 28.6 26.0 - 34.0 pg   MCHC 32.4 30.0 - 36.0 g/dL   RDW 14.6 11.5 - 15.5 %   Platelets 145 (L) 150 - 400 K/uL  Protime-INR     Status: Abnormal   Collection Time: 11/27/15 11:00 AM  Result Value Ref Range   Prothrombin Time 20.3 (H) 11.4 - 15.2 seconds   INR 1.71   CBC     Status: None   Collection Time: 12/04/15 10:00 AM  Result Value Ref Range   WBC 7.5 4.0 - 10.5 K/uL   RBC 4.69 4.22 - 5.81 MIL/uL   Hemoglobin 13.4 13.0 - 17.0 g/dL   HCT 41.2 39.0 - 52.0 %   MCV 87.8 78.0 - 100.0 fL   MCH 28.6 26.0 - 34.0 pg   MCHC 32.5 30.0 - 36.0 g/dL   RDW 14.7 11.5 - 15.5 %   Platelets 167 150 - 400 K/uL  Protime-INR     Status: Abnormal   Collection Time: 12/04/15 10:00 AM  Result Value Ref Range   Prothrombin Time 21.5 (H) 11.4 - 15.2 seconds   INR 1.84   Lactate dehydrogenase     Status: Abnormal   Collection Time: 12/04/15 10:00 AM  Result Value Ref Range   LDH 275 (H) 98 - 192 U/L  Prealbumin     Status: None    Collection Time: 12/04/15 10:00 AM  Result Value Ref Range   Prealbumin 23.2 18 - 38 mg/dL  Comprehensive metabolic panel     Status: Abnormal   Collection Time: 12/04/15 10:00 AM  Result Value Ref Range   Sodium 139 135 - 145 mmol/L   Potassium 3.8 3.5 - 5.1 mmol/L   Chloride 107 101 - 111 mmol/L   CO2 26 22 - 32 mmol/L   Glucose, Bld 92 65 - 99 mg/dL   BUN 13 6 - 20 mg/dL   Creatinine, Ser 1.15 0.61 - 1.24 mg/dL   Calcium 8.8 (L) 8.9 - 10.3 mg/dL   Total Protein 6.7 6.5 - 8.1 g/dL   Albumin 4.0 3.5 - 5.0 g/dL   AST 20 15 - 41 U/L   ALT 15 (L) 17 - 63 U/L   Alkaline Phosphatase 90 38 - 126 U/L   Total Bilirubin 0.8 0.3 - 1.2 mg/dL   GFR calc non Af Amer >60 >60 mL/min   GFR calc Af Amer >60 >60 mL/min    Comment: (NOTE) The eGFR has been calculated using the CKD EPI equation. This calculation has not been validated in all clinical situations. eGFR's persistently <60 mL/min signify possible Chronic Kidney Disease.    Anion gap 6 5 - 15  Protime-INR     Status: Abnormal   Collection Time: 12/11/15 11:15 AM  Result Value Ref Range   Prothrombin Time 24.5 (H) 11.4 - 15.2 seconds   INR 2.17   Protime-INR     Status: Abnormal   Collection Time: 12/26/15 11:53 AM  Result Value Ref Range   Prothrombin Time 30.4 (  H) 11.4 - 15.2 seconds   INR 2.54   Basic metabolic panel     Status: Abnormal   Collection Time: 01/08/16  9:30 AM  Result Value Ref Range   Sodium 138 135 - 145 mmol/L   Potassium 3.4 (L) 3.5 - 5.1 mmol/L   Chloride 105 101 - 111 mmol/L   CO2 26 22 - 32 mmol/L   Glucose, Bld 90 65 - 99 mg/dL   BUN 7 6 - 20 mg/dL   Creatinine, Ser 1.09 0.61 - 1.24 mg/dL   Calcium 9.0 8.9 - 10.3 mg/dL   GFR calc non Af Amer >60 >60 mL/min   GFR calc Af Amer >60 >60 mL/min    Comment: (NOTE) The eGFR has been calculated using the CKD EPI equation. This calculation has not been validated in all clinical situations. eGFR's persistently <60 mL/min signify possible Chronic  Kidney Disease.    Anion gap 7 5 - 15  CBC     Status: None   Collection Time: 01/08/16  9:30 AM  Result Value Ref Range   WBC 6.7 4.0 - 10.5 K/uL   RBC 5.11 4.22 - 5.81 MIL/uL   Hemoglobin 14.6 13.0 - 17.0 g/dL   HCT 44.3 39.0 - 52.0 %   MCV 86.7 78.0 - 100.0 fL   MCH 28.6 26.0 - 34.0 pg   MCHC 33.0 30.0 - 36.0 g/dL   RDW 14.6 11.5 - 15.5 %   Platelets 174 150 - 400 K/uL  Protime-INR     Status: Abnormal   Collection Time: 01/08/16  9:30 AM  Result Value Ref Range   Prothrombin Time 27.3 (H) 11.4 - 15.2 seconds   INR 2.48   Lactate dehydrogenase     Status: Abnormal   Collection Time: 01/08/16  9:30 AM  Result Value Ref Range   LDH 278 (H) 98 - 192 U/L    ASSESSMENT AND PLAN:   1) Chronic systolic HF, s/p HMII LVAD implant 05/2012.  - He is doing well. NYHA I-II.  - Now dual listed at [facility] and [facility]. Recent RHC looks great.  - He is not on a BB. He was able to tolerate low-doseToprol before LVAD, however has been intolerant since that time  - Mild edema. Reinforced the need and importance of daily weights, a low sodium diet, and fluid restriction (less than 2 L a day). Instructed to call the HF clinic if weight increases more than 3 lbs overnight or 5 lbs in a week.  - Recent Driveline infection (see below) 2)  HTN-  - MAP  improved with Entresto 97/03 bid. BP looks good.  3) Hyperthyroidism - Amio induced. Follows with Dr. Forde Dandy. Amio stopped. Now on tapazole and prednisone.  4) NSVT/VT  - Off amio. On mexilitene. - ICD now at EOL. 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  6) Anticoagulation with coumadin  - Continue coumadin and ASA 325 mg for LVAD. 7) LVAD  - Improved on 9600. Continue transplant process. Parameters stable. Did have numerous PI events on 9/25. ICD interrogated personally in  clinic and volume ok. No AF/VT. 8) Driveline infection, recurrent  --Wound cultures with Serratia. Bcx negative. Back on po cipro. Following with ID --Discussed with [facility] regarding possible Ia time due to recurrent Driveline infection but in order to prioritize  would require culture data and evidence of deep infection (need for debridement, etc).   Glori Bickers MD  10:07 PM

## 2016-01-12 ENCOUNTER — Other Ambulatory Visit (HOSPITAL_COMMUNITY): Payer: Self-pay | Admitting: *Deleted

## 2016-01-12 DIAGNOSIS — Z7901 Long term (current) use of anticoagulants: Secondary | ICD-10-CM

## 2016-01-12 MED ORDER — WARFARIN SODIUM 5 MG PO TABS: ORAL_TABLET | ORAL | 6 refills | Status: DC

## 2016-01-12 MED ORDER — AMLODIPINE BESYLATE 10 MG PO TABS: 10.0000 mg | ORAL_TABLET | Freq: Every day | ORAL | 3 refills | Status: DC

## 2016-01-17 ENCOUNTER — Other Ambulatory Visit (HOSPITAL_COMMUNITY): Payer: Self-pay | Admitting: Internal Medicine

## 2016-01-17 DIAGNOSIS — I5023 Acute on chronic systolic (congestive) heart failure: Secondary | ICD-10-CM

## 2016-01-17 DIAGNOSIS — I10 Essential (primary) hypertension: Secondary | ICD-10-CM

## 2016-01-17 DIAGNOSIS — T829XXA Unspecified complication of cardiac and vascular prosthetic device, implant and graft, initial encounter: Secondary | ICD-10-CM

## 2016-01-28 ENCOUNTER — Ambulatory Visit (HOSPITAL_COMMUNITY): Payer: Self-pay | Admitting: Infectious Diseases

## 2016-01-28 ENCOUNTER — Ambulatory Visit (HOSPITAL_COMMUNITY)
Admission: RE | Admit: 2016-01-28 | Discharge: 2016-01-28 | Disposition: A | Discharge status: Home / Self Care | Payer: Medicaid Other | Source: Ambulatory Visit | Attending: Cardiology | Admitting: Cardiology

## 2016-01-28 DIAGNOSIS — Z7901 Long term (current) use of anticoagulants: Secondary | ICD-10-CM | POA: Insufficient documentation

## 2016-01-28 DIAGNOSIS — Z95811 Presence of heart assist device: Secondary | ICD-10-CM | POA: Diagnosis not present

## 2016-01-28 DIAGNOSIS — I5023 Acute on chronic systolic (congestive) heart failure: Secondary | ICD-10-CM

## 2016-01-28 LAB — PROTIME-INR
INR: 2.46
PROTHROMBIN TIME: 27.1 s — AB (ref 11.4–15.2)

## 2016-02-03 ENCOUNTER — Ambulatory Visit (HOSPITAL_COMMUNITY)
Admission: RE | Admit: 2016-02-03 | Discharge: 2016-02-03 | Disposition: A | Discharge status: Home / Self Care | Payer: Medicaid Other | Source: Ambulatory Visit | Attending: Cardiology | Admitting: Cardiology

## 2016-02-03 VITALS — BP 111/88 | HR 70

## 2016-02-03 DIAGNOSIS — L539 Erythematous condition, unspecified: Secondary | ICD-10-CM | POA: Diagnosis not present

## 2016-02-03 DIAGNOSIS — I5023 Acute on chronic systolic (congestive) heart failure: Secondary | ICD-10-CM | POA: Diagnosis present

## 2016-02-03 DIAGNOSIS — Z95811 Presence of heart assist device: Secondary | ICD-10-CM | POA: Diagnosis not present

## 2016-02-03 DIAGNOSIS — R5383 Other fatigue: Secondary | ICD-10-CM | POA: Insufficient documentation

## 2016-02-03 DIAGNOSIS — T829XXD Unspecified complication of cardiac and vascular prosthetic device, implant and graft, subsequent encounter: Secondary | ICD-10-CM

## 2016-02-03 LAB — BASIC METABOLIC PANEL
ANION GAP: 7 (ref 5–15)
BUN: 12 mg/dL (ref 6–20)
CHLORIDE: 106 mmol/L (ref 101–111)
CO2: 26 mmol/L (ref 22–32)
Calcium: 9.2 mg/dL (ref 8.9–10.3)
Creatinine, Ser: 1.1 mg/dL (ref 0.61–1.24)
GFR calc Af Amer: >60 mL/min (ref >60)
GLUCOSE: 90 mg/dL (ref 65–99)
POTASSIUM: 4 mmol/L (ref 3.5–5.1)
SODIUM: 139 mmol/L (ref 135–145)

## 2016-02-03 LAB — CBC
HEMATOCRIT: 43.5 % (ref 39.0–52.0)
HEMOGLOBIN: 14.3 g/dL (ref 13.0–17.0)
MCH: 28.4 pg (ref 26.0–34.0)
MCHC: 32.9 g/dL (ref 30.0–36.0)
MCV: 86.5 fL (ref 78.0–100.0)
Platelets: 172 10*3/uL (ref 150–400)
RBC: 5.03 MIL/uL (ref 4.22–5.81)
RDW: 15.2 % (ref 11.5–15.5)
WBC: 7 10*3/uL (ref 4.0–10.5)

## 2016-02-03 LAB — PROTIME-INR
INR: 2.17
PROTHROMBIN TIME: 24.5 s — AB (ref 11.4–15.2)

## 2016-02-03 LAB — BRAIN NATRIURETIC PEPTIDE: B Natriuretic Peptide: 66.1 pg/mL (ref 0.0–100.0)

## 2016-02-03 MED ORDER — DOXYCYCLINE HYCLATE 50 MG PO CAPS: 100.0000 mg | ORAL_CAPSULE | Freq: Two times a day (BID) | ORAL | 0 refills | Status: AC

## 2016-02-03 MED ORDER — FUROSEMIDE 40 MG PO TABS: 40.0000 mg | ORAL_TABLET | Freq: Every day | ORAL | 3 refills | Status: DC | PRN

## 2016-02-03 NOTE — Addendum Note (Signed)
Encounter addended by: Noralee Space, RN on: 02/03/2016  3:23 PM<BR>    Actions taken: Order Entry activity accessed, Diagnosis association updated

## 2016-02-03 NOTE — Addendum Note (Signed)
Encounter addended by: Noralee Space, RN on: 02/03/2016  3:34 PM<BR>    Actions taken: Visit diagnoses modified, Diagnosis association updated, Order Entry activity accessed

## 2016-02-03 NOTE — Patient Instructions (Signed)
Take furosemide 40 mg daily for 2 days. Start doxycycline 100 mg twice a day for 7 days Lab draw today Back to LVAD clinic on Thursday 02/05/16 for follow up.

## 2016-02-03 NOTE — Progress Notes (Signed)
   Saw today per nursing request for increased exudate around driveline.   Complaining of fatigue and increased exudate around the driveline. Last treated for suspected drive line infection July 2017.   Having small amount of exudate around the driveline with mild erythema noted. Wound culture obtained.   Start doxycyline 100 mg twice daily x 7 days.   Also has mild volume overload today. Asked to take lasix 40 mg daily for 2 days.   Labs obtained. INR, CBC, BMET ok. BNP 66.   Follow up in Thursday.   Amy Clegg NP-C  2:31 PM

## 2016-02-05 ENCOUNTER — Ambulatory Visit (HOSPITAL_COMMUNITY)
Admission: RE | Admit: 2016-02-05 | Discharge: 2016-02-05 | Disposition: A | Discharge status: Home / Self Care | Payer: Medicaid Other | Source: Ambulatory Visit | Attending: Internal Medicine | Admitting: Internal Medicine

## 2016-02-05 VITALS — BP 105/54

## 2016-02-05 DIAGNOSIS — E0590 Thyrotoxicosis, unspecified without thyrotoxic crisis or storm: Secondary | ICD-10-CM | POA: Diagnosis not present

## 2016-02-05 DIAGNOSIS — Z95811 Presence of heart assist device: Secondary | ICD-10-CM | POA: Diagnosis present

## 2016-02-05 LAB — TSH: TSH: 1.422 u[IU]/mL (ref 0.350–4.500)

## 2016-02-05 LAB — T4, FREE: FREE T4: 0.95 ng/dL (ref 0.61–1.12)

## 2016-02-05 NOTE — Progress Notes (Signed)
Mr. Santillana reports he is feeling better today and feels as if his fluid was just up causing him to feel sluggish. Took extra diuretics per Amy's instructions at clinic visit Tuesday. Check TSH/T4/T3 today per Amy with history of hyperthyroidism and non-specific complaints.   Doppler BP 102 Auto BP 105/54 (70) HR: 80   Provided with 8 daily dressing kits and 8 anchors today.   Rexene Alberts, RN VAD Coordinator   Office: [phone] 24/7 Emergency VAD Pager: [phone]

## 2016-02-06 LAB — AEROBIC CULTURE  (SUPERFICIAL SPECIMEN)

## 2016-02-06 LAB — AEROBIC CULTURE W GRAM STAIN (SUPERFICIAL SPECIMEN)

## 2016-02-06 LAB — T3, FREE: T3 FREE: 2.6 pg/mL (ref 2.0–4.4)

## 2016-02-12 ENCOUNTER — Encounter (HOSPITAL_COMMUNITY): Payer: Medicaid Other

## 2016-02-13 ENCOUNTER — Ambulatory Visit (HOSPITAL_COMMUNITY)
Admission: RE | Admit: 2016-02-13 | Discharge: 2016-02-13 | Disposition: A | Discharge status: Home / Self Care | Payer: Medicaid Other | Source: Ambulatory Visit | Attending: Cardiology | Admitting: Cardiology

## 2016-02-13 ENCOUNTER — Other Ambulatory Visit (HOSPITAL_COMMUNITY): Payer: Self-pay | Admitting: Pharmacist

## 2016-02-13 ENCOUNTER — Ambulatory Visit (HOSPITAL_COMMUNITY): Payer: Self-pay | Admitting: Pharmacist

## 2016-02-13 VITALS — BP 111/67 | HR 82

## 2016-02-13 DIAGNOSIS — A498 Other bacterial infections of unspecified site: Secondary | ICD-10-CM

## 2016-02-13 DIAGNOSIS — I493 Ventricular premature depolarization: Secondary | ICD-10-CM | POA: Diagnosis not present

## 2016-02-13 DIAGNOSIS — Z95811 Presence of heart assist device: Secondary | ICD-10-CM

## 2016-02-13 LAB — CBC WITH DIFFERENTIAL/PLATELET
Basophils Absolute: 0 10*3/uL (ref 0.0–0.1)
Basophils Relative: 0 %
Eosinophils Absolute: 0.1 10*3/uL (ref 0.0–0.7)
Eosinophils Relative: 1 %
HCT: 40.5 % (ref 39.0–52.0)
HEMOGLOBIN: 13.2 g/dL (ref 13.0–17.0)
LYMPHS ABS: 1.6 10*3/uL (ref 0.7–4.0)
LYMPHS PCT: 24 %
MCH: 28.1 pg (ref 26.0–34.0)
MCHC: 32.6 g/dL (ref 30.0–36.0)
MCV: 86.2 fL (ref 78.0–100.0)
Monocytes Absolute: 0.5 10*3/uL (ref 0.1–1.0)
Monocytes Relative: 8 %
NEUTROS PCT: 67 %
Neutro Abs: 4.3 10*3/uL (ref 1.7–7.7)
Platelets: 133 10*3/uL — ABNORMAL LOW (ref 150–400)
RBC: 4.7 MIL/uL (ref 4.22–5.81)
RDW: 15 % (ref 11.5–15.5)
WBC: 6.5 10*3/uL (ref 4.0–10.5)

## 2016-02-13 LAB — PROTIME-INR
INR: 1.7
Prothrombin Time: 20.2 seconds — ABNORMAL HIGH (ref 11.4–15.2)

## 2016-02-13 LAB — LACTATE DEHYDROGENASE: LDH: 209 U/L — ABNORMAL HIGH (ref 98–192)

## 2016-02-13 MED ORDER — CIPROFLOXACIN HCL 500 MG PO TABS: 500.0000 mg | ORAL_TABLET | Freq: Two times a day (BID) | ORAL | 0 refills | Status: AC

## 2016-02-13 MED ORDER — MEXILETINE HCL 150 MG PO CAPS: 150.0000 mg | ORAL_CAPSULE | Freq: Two times a day (BID) | ORAL | 5 refills | Status: DC

## 2016-02-13 MED ORDER — CIPROFLOXACIN HCL 500 MG PO TABS: 500.0000 mg | ORAL_TABLET | Freq: Two times a day (BID) | ORAL | 0 refills | Status: DC

## 2016-02-13 NOTE — Progress Notes (Signed)
Patient presents for follow up in VAD Clinic today. Reports no problems with VAD equipment; Concern over exit site drainage and tears to external drive line. NURSE VISIT TODAY  Vital Signs:  Doppler Pressure: 106 Automatc BP: 111/67 (88) HR:  82 SPO2: 100 %  VAD Indication: Bridge to Transplant - Evaluation completed at [facility] with IB listing 05/30/2013; dual listed with [facility] 12/04/2014. Has not used 1A time.   VAD interrogation & Equipment Management: Speed: 9600 Flow: 5.2 Power: 7.0 w    PI: 7.0  Alarms: no clinical alarms  Events: 5 - 10 daily   Fixed speed: 9600 Low speed limit: 9000  Primary Controller:  Replace back up battery in 17 months. Back up controller:   Replace back up battery in 16 months.  Annual Equipment Maintenance on UBC/PM was performed on 06/19/2015.   I reviewed the LVAD parameters from today and compared the results to the patient's prior recorded data with Tonye Becket, NP. LVAD interrogation was NEGATIVE for significant power changes, NEGATIVE for clinical alarms and STABLE for PI events/speed drops. No programming changes were made and pump is functioning within specified parameters. Pt is performing daily controller and system monitor self tests along with completing weekly and monthly maintenance for LVAD equipment.  LVAD equipment check completed and is in good working order. Back-up equipment present. Charged back up battery and performed self-test on equipment.   Exit Site Care: Drive line is being maintained daily by patient himself. Drive line exit site well healed and incorporated. Small amount of granulation tissue present where previous chemical cautery done. Small tan drainage noted. Patient reports amount present today is smaller than usual. The velour is fully implanted at exit site. Gauze dressing dry and intact. No erythema, swelling or pain. Stabilization device present and accurately applied. Pt denies fever or chills. Pt states they have  adequate dressing supplies at home, provided with 2 sheets cut Aquacel Ag strips today.      ignificant tearing to external driveline with previous repair attempts. All tape was removed showing some near complete tear of vinyl. Also with moisture under tape attempts. Patient reports he is showering daily. Advised to NOT shower while ongoing driveline infection as D/W Amy Clegg.   Re-taped nearly entire driveline with rescue tape. Advised to assess daily for need for further repairs. Also cautioned that continued damage to line can potentially cause internal lead fraying/damage which will potentially impact pump function.          Significant Events on VAD Support:  08/26/15 >> Serratia marcescens (rare) >> Cipro 500 mg bid x 7 days  09/29/15 >> Serratia marcescens (abundant) >> Cipro 500 mg BID x14d 11/20/15 >> Serratia marcescens (few) Admitted IV ceftriaxone >> cipro 500 mg twice daily x 30 days 02/03/16 >> Serratia species >> Cipro 500 mg BID x 14d; referral to ID 02/13/16   Device: Medtronic Dual  Therapies: on Last check: 08/05/2014 (assessed Optivol 02/05/16)  BP & Labs:  MAP 86 - Doppler is reflecting modified systolic  Hgb 13.2 - No S/S of bleeding. Specifically denies melena/BRBPR or nosebleeds.  WBC 6.5 today   LDH stable at 209 with established baseline of 230- 300. Denies tea-colored urine. No power elevations noted on interrogation.   Per Amy - referral sent to ID for assistance in managing chronic serratia wound infection.    Rexene Alberts, RN VAD Coordinator   Office: [phone] 24/7 Emergency VAD Pager: [phone]

## 2016-02-13 NOTE — Patient Instructions (Signed)
STOP Doxycycline antibiotic  START Ciprofloxacin 500 mg tabs - take ONE TWICE A DAY until gone (14 days)  Will call you with plan for your drive line after we speak with manufacturer.

## 2016-02-17 ENCOUNTER — Telehealth (HOSPITAL_COMMUNITY): Payer: Self-pay | Admitting: Infectious Diseases

## 2016-02-17 NOTE — Telephone Encounter (Signed)
Called to inform patient that Dr. Donata Clay would like to see him in office for assessment of driveline since has had 4 + cultures since May. Left voicemail to call back with questions otherwise the [facility] office will be contacting him with an appointment.   Rexene Alberts, RN VAD Coordinator   Office: [phone] 24/7 Emergency VAD Pager: [phone]

## 2016-02-20 ENCOUNTER — Ambulatory Visit (HOSPITAL_COMMUNITY)
Admission: RE | Admit: 2016-02-20 | Discharge: 2016-02-20 | Disposition: A | Discharge status: Home / Self Care | Payer: Medicaid Other | Source: Ambulatory Visit | Attending: Cardiology | Admitting: Cardiology

## 2016-02-20 ENCOUNTER — Ambulatory Visit (HOSPITAL_COMMUNITY): Payer: Self-pay | Admitting: Pharmacist

## 2016-02-20 ENCOUNTER — Telehealth (HOSPITAL_COMMUNITY): Payer: Self-pay | Admitting: Pharmacist

## 2016-02-20 DIAGNOSIS — Z95811 Presence of heart assist device: Secondary | ICD-10-CM

## 2016-02-20 LAB — PROTIME-INR
INR: 2.69
Prothrombin Time: 29.2 seconds — ABNORMAL HIGH (ref 11.4–15.2)

## 2016-02-20 NOTE — Telephone Encounter (Signed)
Entresto 97-103 mg BID PA approved by [place] [employer] from 01/21/16-01/15/17.   Tyler Deis. Bonnye Fava, PharmD, BCPS, CPP Clinical Pharmacist Pager: [phone] Phone: [phone] 02/20/2016 9:35 AM

## 2016-02-24 ENCOUNTER — Telehealth (HOSPITAL_COMMUNITY): Payer: Self-pay

## 2016-02-24 NOTE — Telephone Encounter (Signed)
Attempted to reach patient by phone on all lines in chart, no answer, VMs left on each line to return call to CHF clinic. Advised via VM patient needs to bring dressing kit to apt with Dr. Prescott Gum tomorrow. Will deliver kit to office if patient does not return our call.  Renee Pain, RN

## 2016-02-25 ENCOUNTER — Encounter: Payer: Self-pay | Admitting: Cardiothoracic Surgery

## 2016-02-25 ENCOUNTER — Ambulatory Visit (INDEPENDENT_AMBULATORY_CARE_PROVIDER_SITE_OTHER): Payer: Medicaid Other | Admitting: Cardiothoracic Surgery

## 2016-02-25 ENCOUNTER — Ambulatory Visit (HOSPITAL_COMMUNITY): Payer: Self-pay | Admitting: Infectious Diseases

## 2016-02-25 ENCOUNTER — Encounter (HOSPITAL_COMMUNITY): Payer: Self-pay | Admitting: Infectious Diseases

## 2016-02-25 ENCOUNTER — Ambulatory Visit (HOSPITAL_COMMUNITY)
Admission: RE | Admit: 2016-02-25 | Discharge: 2016-02-25 | Disposition: A | Discharge status: Home / Self Care | Payer: Medicaid Other | Source: Ambulatory Visit | Attending: Internal Medicine | Admitting: Internal Medicine

## 2016-02-25 ENCOUNTER — Other Ambulatory Visit: Payer: Self-pay | Admitting: Cardiothoracic Surgery

## 2016-02-25 VITALS — Resp 20 | Ht 74.0 in | Wt 254.0 lb

## 2016-02-25 DIAGNOSIS — Z95811 Presence of heart assist device: Secondary | ICD-10-CM | POA: Insufficient documentation

## 2016-02-25 LAB — PROTIME-INR
INR: 2.62
PROTHROMBIN TIME: 28.6 s — AB (ref 11.4–15.2)

## 2016-02-25 NOTE — Progress Notes (Signed)
PCP is Almon Hercules, MD Referring Provider is Bensimhon, Bevelyn Buckles, MD  Chief Complaint  Patient presents with  . 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 The patient presents today for evaluation of a driveline exit site infection. Patient was hospitalized for IV antibiotics in August of this year for drainage around driveline. This fluid was culture positive for Serratia marcescens. The organism was sensitive to Cipro. The patient was treated with a full course of IV and oral Cipro. He initially had a good response but then drainage recurred and was persistent but with minimal volume. He ihas been using daily dressing changes with with topical Silver gauze and oral suppressive dose of Cipro. Some days he has change the dressing twice because of drainage. He denies fever or systemic symptoms of infection. His last chest x-ray is clear. His last echocardiogram earlier this year showed good LVAD placement and function without significant AI or TR and with mild RV dysfunction. The patient is on a stable dose of Coumadin and has had no bleeding or clotting complications of anticoagulation for mechanical assist device.  Past Medical History:  Diagnosis Date  . AICD (automatic cardioverter/defibrillator) present 2011  . Bronchitis   . Congestive heart failure (HCC) 2011  . Hypertension 2000  . LVAD (left ventricular assist device) present (HCC) 2014  . Pneumonia   . Pulmonary hypertension     Past Surgical History:  Procedure Laterality Date  . CARDIAC CATHETERIZATION    . CARDIAC CATHETERIZATION N/A 11/04/2014   Procedure: Right Heart Cath;  Surgeon: Dolores Patty, MD;  Location: [facility] INVASIVE CV LAB;  Service: Cardiovascular;  Laterality: N/A;  . CARDIAC CATHETERIZATION N/A 11/06/2015   Procedure: Right Heart Cath;  Surgeon: Dolores Patty, MD;   Location: [facility] INVASIVE CV LAB;  Service: Cardiovascular;  Laterality: N/A;  . CARDIAC DEFIBRILLATOR PLACEMENT  06/03/11  . COLONOSCOPY  12/29/2011   Procedure: COLONOSCOPY;  Surgeon: Iva Boop, MD;  Location: [facility] ENDOSCOPY;  Service: Endoscopy;  Laterality: N/A;  . INSERTION OF IMPLANTABLE LEFT VENTRICULAR ASSIST DEVICE N/A 06/02/2012   Procedure: INSERTION OF IMPLANTABLE LEFT VENTRICULAR ASSIST DEVICE;  Surgeon: Kerin Perna, MD;  Location: [facility] OR;  Service: Open Heart Surgery;  Laterality: N/A;  Nitric Oxide; TEE; Medtronic AICD  . INTRA-AORTIC BALLOON PUMP INSERTION N/A 05/31/2012   Procedure: INTRA-AORTIC BALLOON PUMP INSERTION;  Surgeon: Dolores Patty, MD;  Location: [facility] CATH LAB;  Service: Cardiovascular;  Laterality: N/A;  . INTRAOPERATIVE TRANSESOPHAGEAL ECHOCARDIOGRAM N/A 06/02/2012   Procedure: INTRAOPERATIVE TRANSESOPHAGEAL ECHOCARDIOGRAM;  Surgeon: Kerin Perna, MD;  Location: [facility] OR;  Service: Open Heart Surgery;  Laterality: N/A;  . LEFT AND RIGHT HEART CATHETERIZATION WITH CORONARY ANGIOGRAM N/A 08/17/2011   Procedure: LEFT AND RIGHT HEART CATHETERIZATION WITH CORONARY ANGIOGRAM;  Surgeon: Thurmon Fair, MD;  Location: [facility] CATH LAB;  Service: Cardiovascular;  Laterality: N/A;  . RIGHT HEART CATHETERIZATION N/A 05/30/2012   Procedure: RIGHT HEART CATH;  Surgeon: Dolores Patty, MD;  Location: [facility] CATH LAB;  Service: Cardiovascular;  Laterality: N/A;  . RIGHT HEART CATHETERIZATION N/A 06/10/2014   Procedure: RIGHT HEART CATH;  Surgeon: Dolores Patty, MD;  Location: [facility] CATH LAB;  Service: Cardiovascular;  Laterality: N/A;    No family history on file.  Social History Social History  Substance Use Topics  . Smoking status: Never Smoker  .  Smokeless tobacco: Never Used  . Alcohol use No    Current Outpatient Prescriptions  Medication Sig Dispense Refill  . allopurinol (ZYLOPRIM) 300 MG tablet Take 1 tablet (300 mg total) by mouth daily. 30 tablet 6  . amLODipine  (NORVASC) 10 MG tablet Take 1 tablet (10 mg total) by mouth daily. 90 tablet 3  . aspirin EC 325 MG tablet Take 1 tablet (325 mg total) by mouth daily. 30 tablet 6  . ciprofloxacin (CIPRO) 500 MG tablet Take 1 tablet (500 mg total) by mouth 2 (two) times daily. 28 tablet 0  . ENTRESTO 97-103 MG TAKE 1 TABLET BY MOUTH TWICE A DAY. 60 tablet 3  . furosemide (LASIX) 40 MG tablet Take 1 tablet (40 mg total) by mouth daily as needed. Take as directed by clinic 30 tablet 3  . mexiletine (MEXITIL) 150 MG capsule Take 1 capsule (150 mg total) by mouth 2 (two) times daily. 60 capsule 5  . Multiple Vitamin (MULTIVITAMIN) tablet Take 1 tablet by mouth daily.    . pantoprazole (PROTONIX) 40 MG tablet TAKE 1 TABLET BY MOUTH EVERY DAY 90 tablet 3  . traMADol (ULTRAM) 50 MG tablet Take 1 tablet (50 mg total) by mouth every 8 (eight) hours as needed for moderate pain. 30 tablet 5  . warfarin (COUMADIN) 5 MG tablet Take 1 & 1/2 tablet daily. 45 tablet 6   No current facility-administered medications for this visit.     Allergies  Allergen Reactions  . Imdur [Isosorbide Nitrate] Other (See Comments)    Headache  . Metoprolol     Passes out    Review of Systems:       Review of Systems :  [ y ] = yes, [  ] = no        General :  Weight gain [   ]    Weight loss  [   ]  Fatigue [  ]  Fever [  ]  Chills  [  ]                                Weakness  [  ]           HEENT    Headache [  ]  Dizziness [  ]  Blurred vision [  ] Glaucoma  [  ]                          Nosebleeds [  ] Painful or loose teeth [  ]        Cardiac :  Chest pain/ pressure [  ]  Resting SOB [  ] exertional SOB [  ]                        Orthopnea [  ]  Pedal edema  [  ]  Palpitations [  ] Syncope/presyncope [ ]                         Paroxysmal nocturnal dyspnea [  ]         Pulmonary : cough [ minimal ]  wheezing [  ]  Hemoptysis [  ] Sputum [  ] Snoring [  ]  Pneumothorax [  ]  Sleep apnea [  ]         GI : Vomiting [  ]  Dysphagia [  ]  Melena  [  ]  Abdominal pain [  ] BRBPR [  ]              Heart burn [  ]  Constipation [  ] Diarrhea  [  ] Colonoscopy [   ]        GU : Hematuria [  ]  Dysuria [  ]  Nocturia [  ] UTI's [  ]        Vascular : Claudication [  ]  Rest pain [  ]  DVT [  ] Vein stripping [  ] leg ulcers [  ]                          TIA [  ] Stroke [  ]  Varicose veins [  ]        NEURO :  Headaches  [  ] Seizures [  ] Vision changes [  ] Paresthesias [  ]                                       Seizures [  ]        Musculoskeletal :  Arthritis [  ] Gout  [  ]  Back pain [  ]  Joint pain [  ]        Skin :  Rash [  ]  Melanoma [  ] Sores [  ]superficial infection of VAD driveline exit site since August, culture positive for Serratia marcescens        Heme : Bleeding problems [  ]Clotting Disorders [  ] Anemia [  ]Blood Transfusion [ ]         Endocrine : Diabetes [  ] Heat or Cold intolerance [  ] Polyuria [  ]excessive thirst [ ]         Psych : Depression [  ]  Anxiety [  ]  Psych hospitalizations [  ] Memory change [  ]                                               Resp 20   Ht 6\' 2"  (1.88 m)   Wt 254 lb (115.2 kg)   SpO2 99% Comment: RA  BMI 32.61 kg/m  Physical Exam:     Physical Exam  General: Middle-aged well-developed alert AA  Male no acute distress  HEENT: Normocephalic pupils equal , dentition adequate Neck: Supple without JVD, adenopathy, or bruit Chest: Clear to auscultation, symmetrical breath sounds, no rhonchi, no tenderness             or deformity Cardiovascular: Regular rate and rhythm, no murmur, no gallop, peripheral pulses Not palpable but extremities well-perfused              Abdomen:  Soft, nontender, no palpable mass or organomegaly  VAD exam shows a soft tissue defect- tunnel   around the driveline which extends at least 5 cm from the exit site. Repeat culture was obtained  Extremities: Warm, well-perfused, no clubbing  cyanosis edema or tenderness,              no venous stasis changes of the legs Rectal/GU: Deferred Neuro: Grossly non--focal and symmetrical throughout Skin: Clean and dry without rash or ulceration   Diagnostic Tests: None  Impression: Normal functioning LVAD placed 3 years ago for advanced heart failure and ejection fraction of 15% with dilated cardiomyopathy  Superficial VAD exit site infection involving the soft tissue of the abdomen culture positive for Serratia in the past. There is a well-defined space-tunnel around driveline that will preclude resolution of this infection until the space is opened, debrided and covered with a wound VAC  Plan: Patient will be admitted for surgery at [place] scheduled December 7. He will be  admitted on December 5 for a IV heparin bridge to allow Coumadin washout. Procedure indications and risks discussed with patient and agrees.  Robert Bussing, MD [facility] [phone]

## 2016-02-28 LAB — CULTURE, ROUTINE-ABSCESS
Gram Stain: NONE SEEN
Gram Stain: NONE SEEN

## 2016-03-09 ENCOUNTER — Inpatient Hospital Stay (HOSPITAL_COMMUNITY)
Admission: RE | Admit: 2016-03-09 | Discharge: 2016-03-20 | DRG: 264 | Disposition: A | Discharge status: Home Health | Payer: Medicaid Other | Source: Ambulatory Visit | Attending: Internal Medicine | Admitting: Internal Medicine

## 2016-03-09 DIAGNOSIS — K219 Gastro-esophageal reflux disease without esophagitis: Secondary | ICD-10-CM | POA: Diagnosis present

## 2016-03-09 DIAGNOSIS — D680 Von Willebrand's disease: Secondary | ICD-10-CM | POA: Diagnosis present

## 2016-03-09 DIAGNOSIS — K5900 Constipation, unspecified: Secondary | ICD-10-CM | POA: Diagnosis present

## 2016-03-09 DIAGNOSIS — I272 Pulmonary hypertension, unspecified: Secondary | ICD-10-CM | POA: Diagnosis present

## 2016-03-09 DIAGNOSIS — L089 Local infection of the skin and subcutaneous tissue, unspecified: Secondary | ICD-10-CM

## 2016-03-09 DIAGNOSIS — I5022 Chronic systolic (congestive) heart failure: Secondary | ICD-10-CM

## 2016-03-09 DIAGNOSIS — T148XXA Other injury of unspecified body region, initial encounter: Secondary | ICD-10-CM | POA: Diagnosis not present

## 2016-03-09 DIAGNOSIS — I471 Supraventricular tachycardia: Secondary | ICD-10-CM | POA: Diagnosis present

## 2016-03-09 DIAGNOSIS — T827XXA Infection and inflammatory reaction due to other cardiac and vascular devices, implants and grafts, initial encounter: Principal | ICD-10-CM | POA: Diagnosis present

## 2016-03-09 DIAGNOSIS — E0590 Thyrotoxicosis, unspecified without thyrotoxic crisis or storm: Secondary | ICD-10-CM | POA: Diagnosis present

## 2016-03-09 DIAGNOSIS — E669 Obesity, unspecified: Secondary | ICD-10-CM | POA: Diagnosis present

## 2016-03-09 DIAGNOSIS — Z01811 Encounter for preprocedural respiratory examination: Secondary | ICD-10-CM

## 2016-03-09 DIAGNOSIS — Z95828 Presence of other vascular implants and grafts: Secondary | ICD-10-CM | POA: Diagnosis not present

## 2016-03-09 DIAGNOSIS — T45515A Adverse effect of anticoagulants, initial encounter: Secondary | ICD-10-CM | POA: Diagnosis present

## 2016-03-09 DIAGNOSIS — Z23 Encounter for immunization: Secondary | ICD-10-CM

## 2016-03-09 DIAGNOSIS — D6832 Hemorrhagic disorder due to extrinsic circulating anticoagulants: Secondary | ICD-10-CM | POA: Diagnosis present

## 2016-03-09 DIAGNOSIS — Z79891 Long term (current) use of opiate analgesic: Secondary | ICD-10-CM

## 2016-03-09 DIAGNOSIS — I5023 Acute on chronic systolic (congestive) heart failure: Secondary | ICD-10-CM | POA: Diagnosis present

## 2016-03-09 DIAGNOSIS — Z7682 Awaiting organ transplant status: Secondary | ICD-10-CM | POA: Diagnosis not present

## 2016-03-09 DIAGNOSIS — L7622 Postprocedural hemorrhage and hematoma of skin and subcutaneous tissue following other procedure: Secondary | ICD-10-CM | POA: Diagnosis not present

## 2016-03-09 DIAGNOSIS — Z7901 Long term (current) use of anticoagulants: Secondary | ICD-10-CM

## 2016-03-09 DIAGNOSIS — Z7982 Long term (current) use of aspirin: Secondary | ICD-10-CM

## 2016-03-09 DIAGNOSIS — I10 Essential (primary) hypertension: Secondary | ICD-10-CM | POA: Diagnosis not present

## 2016-03-09 DIAGNOSIS — Y712 Prosthetic and other implants, materials and accessory cardiovascular devices associated with adverse incidents: Secondary | ICD-10-CM | POA: Diagnosis not present

## 2016-03-09 DIAGNOSIS — Y831 Surgical operation with implant of artificial internal device as the cause of abnormal reaction of the patient, or of later complication, without mention of misadventure at the time of the procedure: Secondary | ICD-10-CM | POA: Diagnosis present

## 2016-03-09 DIAGNOSIS — Y92230 Patient room in hospital as the place of occurrence of the external cause: Secondary | ICD-10-CM | POA: Diagnosis not present

## 2016-03-09 DIAGNOSIS — I429 Cardiomyopathy, unspecified: Secondary | ICD-10-CM | POA: Diagnosis present

## 2016-03-09 DIAGNOSIS — Y838 Other surgical procedures as the cause of abnormal reaction of the patient, or of later complication, without mention of misadventure at the time of the procedure: Secondary | ICD-10-CM | POA: Diagnosis not present

## 2016-03-09 DIAGNOSIS — Z95810 Presence of automatic (implantable) cardiac defibrillator: Secondary | ICD-10-CM

## 2016-03-09 DIAGNOSIS — I472 Ventricular tachycardia: Secondary | ICD-10-CM | POA: Diagnosis present

## 2016-03-09 DIAGNOSIS — I493 Ventricular premature depolarization: Secondary | ICD-10-CM | POA: Diagnosis present

## 2016-03-09 DIAGNOSIS — Z95811 Presence of heart assist device: Secondary | ICD-10-CM

## 2016-03-09 DIAGNOSIS — I9789 Other postprocedural complications and disorders of the circulatory system, not elsewhere classified: Secondary | ICD-10-CM | POA: Diagnosis not present

## 2016-03-09 DIAGNOSIS — A488 Other specified bacterial diseases: Secondary | ICD-10-CM

## 2016-03-09 DIAGNOSIS — J156 Pneumonia due to other aerobic Gram-negative bacteria: Secondary | ICD-10-CM | POA: Diagnosis not present

## 2016-03-09 DIAGNOSIS — I454 Nonspecific intraventricular block: Secondary | ICD-10-CM | POA: Diagnosis present

## 2016-03-09 DIAGNOSIS — B9689 Other specified bacterial agents as the cause of diseases classified elsewhere: Secondary | ICD-10-CM | POA: Diagnosis present

## 2016-03-09 DIAGNOSIS — I110 Hypertensive heart disease with heart failure: Secondary | ICD-10-CM | POA: Diagnosis present

## 2016-03-09 DIAGNOSIS — T829XXA Unspecified complication of cardiac and vascular prosthetic device, implant and graft, initial encounter: Secondary | ICD-10-CM | POA: Diagnosis not present

## 2016-03-09 DIAGNOSIS — Z6830 Body mass index (BMI) 30.0-30.9, adult: Secondary | ICD-10-CM

## 2016-03-09 DIAGNOSIS — Z79899 Other long term (current) drug therapy: Secondary | ICD-10-CM

## 2016-03-09 DIAGNOSIS — T829XXD Unspecified complication of cardiac and vascular prosthetic device, implant and graft, subsequent encounter: Secondary | ICD-10-CM | POA: Diagnosis not present

## 2016-03-09 DIAGNOSIS — Z888 Allergy status to other drugs, medicaments and biological substances status: Secondary | ICD-10-CM

## 2016-03-09 DIAGNOSIS — T827XXD Infection and inflammatory reaction due to other cardiac and vascular devices, implants and grafts, subsequent encounter: Secondary | ICD-10-CM | POA: Diagnosis not present

## 2016-03-09 LAB — PROTIME-INR
INR: 1.8
PROTHROMBIN TIME: 21.1 s — AB (ref 11.4–15.2)

## 2016-03-09 LAB — LACTATE DEHYDROGENASE: LDH: 224 U/L — ABNORMAL HIGH (ref 98–192)

## 2016-03-09 LAB — MRSA PCR SCREENING: MRSA BY PCR: NEGATIVE

## 2016-03-09 MED ORDER — ALLOPURINOL 300 MG PO TABS
300.0000 mg | ORAL_TABLET | Freq: Every day | ORAL | Status: DC
  Administered 2016-03-10 – 2016-03-20 (×11): 300 mg via ORAL
  Filled 2016-03-09 (×11): qty 1

## 2016-03-09 MED ORDER — FUROSEMIDE 40 MG PO TABS: 40.0000 mg | ORAL_TABLET | Freq: Every day | ORAL | Status: DC | PRN

## 2016-03-09 MED ORDER — SACUBITRIL-VALSARTAN 97-103 MG PO TABS
1.0000 | ORAL_TABLET | Freq: Two times a day (BID) | ORAL | Status: DC
  Administered 2016-03-09 – 2016-03-20 (×22): 1 via ORAL
  Filled 2016-03-09 (×23): qty 1

## 2016-03-09 MED ORDER — ADULT MULTIVITAMIN W/MINERALS CH
1.0000 | ORAL_TABLET | Freq: Every day | ORAL | Status: DC
  Administered 2016-03-10 – 2016-03-20 (×11): 1 via ORAL
  Filled 2016-03-09 (×11): qty 1

## 2016-03-09 MED ORDER — ASPIRIN EC 325 MG PO TBEC
325.0000 mg | DELAYED_RELEASE_TABLET | Freq: Every day | ORAL | Status: DC
  Administered 2016-03-10 – 2016-03-20 (×11): 325 mg via ORAL
  Filled 2016-03-09 (×11): qty 1

## 2016-03-09 MED ORDER — TRAMADOL HCL 50 MG PO TABS: 50.0000 mg | ORAL_TABLET | Freq: Three times a day (TID) | ORAL | Status: DC | PRN

## 2016-03-09 MED ORDER — MEXILETINE HCL 150 MG PO CAPS
150.0000 mg | ORAL_CAPSULE | Freq: Two times a day (BID) | ORAL | Status: DC
  Administered 2016-03-09 – 2016-03-20 (×22): 150 mg via ORAL
  Filled 2016-03-09 (×22): qty 1

## 2016-03-09 MED ORDER — HEPARIN (PORCINE) IN NACL 100-0.45 UNIT/ML-% IJ SOLN
1350.0000 [IU]/h | INTRAMUSCULAR | Status: DC
  Administered 2016-03-09 – 2016-03-11 (×3): 1600 [IU]/h via INTRAVENOUS
  Filled 2016-03-09 (×3): qty 250

## 2016-03-09 MED ORDER — ONE-DAILY MULTI VITAMINS PO TABS: 1.0000 | ORAL_TABLET | Freq: Every day | ORAL | Status: DC

## 2016-03-09 MED ORDER — PANTOPRAZOLE SODIUM 40 MG PO TBEC
40.0000 mg | DELAYED_RELEASE_TABLET | Freq: Every day | ORAL | Status: DC
  Administered 2016-03-10 – 2016-03-20 (×11): 40 mg via ORAL
  Filled 2016-03-09 (×11): qty 1

## 2016-03-09 NOTE — H&P (Signed)
Advanced Heart Failure VAD History and Physical Note   Reason for Admission: Driveline infection.   HPI:    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 status post dual chamber Medtronic ICD implanted April 2011. Cath 2011 showed normal coronaries. He also has a history of VTach . Currently listed as 1b list for heart tx at [facility].   Amio recently stopped due to thyrotoxicity. Placed on prednisone and tapazole. Had VT off amio and mexilitene started.  Had RHC on 8/17 as part of dual listing process at [facility]. Normal filling pressures and cardiac output.   Recently admitted in 8/17 for recurrent serratia driveline exit site infection. Back on cipro.  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 status post dual chamber Medtronic ICD implanted April 2011. Cath 2011 showed normal coronaries. He also has a history of VTach . Currently listed as 1b list for heart tx at [facility].   Amio recently stopped due to thyrotoxicity. Placed on prednisone and tapazole. Had VT off amio and mexilitene started.  Had RHC on 8/17 as part of dual listing process at [facility]. Normal filling pressures and cardiac output.   Recently admitted in 8/17 for recurrent serratia driveline exit site infection. Back on cipro.  Had has ongoing exudate from driveline despite chronic oral antibiotics, requiring multiple dressing changes a day.  He is stable from HF standpoint.   No fever or chills.  He is being admitted today for IV heparin bridge for Driveline debridement 03/11/16.   LVAD INTERROGATION:      Review of Systems: [y] = yes, [ ]  = no   General: Weight gain [ ] ; Weight loss [ ] ; Anorexia [ ] ; Fatigue [ ] ; Fever [ ] ; Chills [ ] ; Weakness [ ]   Cardiac: Chest pain/pressure [ ] ;  Resting SOB [ ] ; Exertional SOB [ ] ; Orthopnea [ ] ; Pedal Edema [ ] ; Palpitations [ ] ; Syncope [ ] ; Presyncope [ ] ; Paroxysmal nocturnal dyspnea[ ]   Pulmonary: Cough [minimal]; Wheezing[ ] ; Hemoptysis[ ] ; Sputum [ ] ; Snoring [ ]   GI: Vomiting[ ] ; Dysphagia[ ] ; Melena[ ] ; Hematochezia [ ] ; Heartburn[ ] ; Abdominal pain [ ] ; Constipation [ ] ; Diarrhea [ ] ; BRBPR [ ]   GU: Hematuria[ ] ; Dysuria [ ] ; Nocturia[ ]   Vascular: Pain in legs with walking [ ] ; Pain in feet with lying flat [ ] ; Non-healing sores [ ] ; Stroke [ ] ; TIA [ ] ; Slurred speech [ ] ;  Neuro: Headaches[ ] ; Vertigo[ ] ; Seizures[ ] ; Paresthesias[ ] ;Blurred vision [ ] ; Diplopia [ ] ; Vision changes [ ]   Ortho/Skin: Arthritis [ ] ; Joint pain [ ] ; Muscle pain [ ] ; Joint swelling [ ] ; Back Pain [ ] ; Rash [ ]  Superficial infection of VAD driveline exit site since August, culture positive for Serratia marcescens Psych: Depression[ ] ; Anxiety[ ]   Heme: Bleeding problems [ ] ; Clotting disorders [ ] ; Anemia [ ]   Endocrine: Diabetes [ ] ; Thyroid dysfunction[ ]     Home Medications Prior to Admission medications   Medication Sig Start Date End Date Taking? Authorizing Provider  allopurinol (ZYLOPRIM) 300 MG tablet Take 1 tablet (300 mg total) by mouth daily. 07/07/15  Yes Dolores Patty, MD  aspirin EC 325 MG tablet Take 1 tablet (325  mg total) by mouth daily. 04/30/13  Yes Bevelyn Buckles Livy Ross, MD  ENTRESTO 97-103 MG TAKE 1 TABLET BY MOUTH TWICE A DAY. 01/19/16  Yes Dolores Patty, MD  furosemide (LASIX) 40 MG tablet Take 1 tablet (40 mg total) by mouth daily as needed. Take as directed by clinic 02/03/16 05/03/16 Yes Laurey Morale, MD  mexiletine (MEXITIL) 150 MG capsule Take 1 capsule (150 mg total) by mouth 2 (two) times daily. 02/13/16  Yes Dolores Patty, MD  Multiple Vitamin (MULTIVITAMIN) tablet Take 1 tablet by mouth daily.   Yes Historical Provider, MD  pantoprazole (PROTONIX) 40 MG tablet TAKE 1 TABLET BY MOUTH EVERY DAY 07/08/15  Yes  Dolores Patty, MD  traMADol (ULTRAM) 50 MG tablet Take 1 tablet (50 mg total) by mouth every 8 (eight) hours as needed for moderate pain. 12/04/15  Yes Dolores Patty, MD  warfarin (COUMADIN) 5 MG tablet Take 1 & 1/2 tablet daily. Patient taking differently: Take 7.5 mg by mouth daily at 6 PM. Take 1 & 1/2 tablet daily. 01/12/16  Yes Dolores Patty, MD    Past Medical History: Past Medical History:  Diagnosis Date  . AICD (automatic cardioverter/defibrillator) present 2011  . Bronchitis   . Congestive heart failure (HCC) 2011  . Hypertension 2000  . LVAD (left ventricular assist device) present (HCC) 2014  . Pneumonia   . Pulmonary hypertension     Past Surgical History: Past Surgical History:  Procedure Laterality Date  . CARDIAC CATHETERIZATION    . CARDIAC CATHETERIZATION N/A 11/04/2014   Procedure: Right Heart Cath;  Surgeon: Dolores Patty, MD;  Location: [facility] INVASIVE CV LAB;  Service: Cardiovascular;  Laterality: N/A;  . CARDIAC CATHETERIZATION N/A 11/06/2015   Procedure: Right Heart Cath;  Surgeon: Dolores Patty, MD;  Location: [facility] INVASIVE CV LAB;  Service: Cardiovascular;  Laterality: N/A;  . CARDIAC DEFIBRILLATOR PLACEMENT  06/03/11  . COLONOSCOPY  12/29/2011   Procedure: COLONOSCOPY;  Surgeon: Iva Boop, MD;  Location: [facility] ENDOSCOPY;  Service: Endoscopy;  Laterality: N/A;  . INSERTION OF IMPLANTABLE LEFT VENTRICULAR ASSIST DEVICE N/A 06/02/2012   Procedure: INSERTION OF IMPLANTABLE LEFT VENTRICULAR ASSIST DEVICE;  Surgeon: Kerin Perna, MD;  Location: [facility] OR;  Service: Open Heart Surgery;  Laterality: N/A;  Nitric Oxide; TEE; Medtronic AICD  . INTRA-AORTIC BALLOON PUMP INSERTION N/A 05/31/2012   Procedure: INTRA-AORTIC BALLOON PUMP INSERTION;  Surgeon: Dolores Patty, MD;  Location: [facility] CATH LAB;  Service: Cardiovascular;  Laterality: N/A;  . INTRAOPERATIVE TRANSESOPHAGEAL ECHOCARDIOGRAM N/A 06/02/2012   Procedure: INTRAOPERATIVE TRANSESOPHAGEAL  ECHOCARDIOGRAM;  Surgeon: Kerin Perna, MD;  Location: [facility] OR;  Service: Open Heart Surgery;  Laterality: N/A;  . LEFT AND RIGHT HEART CATHETERIZATION WITH CORONARY ANGIOGRAM N/A 08/17/2011   Procedure: LEFT AND RIGHT HEART CATHETERIZATION WITH CORONARY ANGIOGRAM;  Surgeon: Thurmon Fair, MD;  Location: [facility] CATH LAB;  Service: Cardiovascular;  Laterality: N/A;  . RIGHT HEART CATHETERIZATION N/A 05/30/2012   Procedure: RIGHT HEART CATH;  Surgeon: Dolores Patty, MD;  Location: [facility] CATH LAB;  Service: Cardiovascular;  Laterality: N/A;  . RIGHT HEART CATHETERIZATION N/A 06/10/2014   Procedure: RIGHT HEART CATH;  Surgeon: Dolores Patty, MD;  Location: [facility] CATH LAB;  Service: Cardiovascular;  Laterality: N/A;    Family History: No family history on file.  Social History: Social History   Social History  . Marital status: Single    Spouse name: N/A  . Number of children: 2  .  Years of education: N/A   Occupational History  . Disabled    Social History Main Topics  . Smoking status: Never Smoker  . Smokeless tobacco: Never Used  . Alcohol use No  . Drug use: No  . Sexual activity: Not on file   Other Topics Concern  . Not on file   Social History Narrative  . No narrative on file    Allergies:  Allergies  Allergen Reactions  . Imdur [Isosorbide Nitrate] Other (See Comments)    Headache  . Metoprolol     Passes out    Objective:    Vital Signs:      Mean arterial Pressure 80-90s chronically.  Physical Exam: GENERAL: WDWN, NAD.  HEENT: Normal NECK: Supple, JVP 7-8;  2+ bilaterally, no bruits.  No thyromegaly or nodule noted.  CARDIAC:  Mechanical heart sounds with LVAD hum present.  LUNGS:  CTAB, normal effort ABDOMEN:  Soft, round, NT, ND, no HSM. No bruits or masses. +BS    LVAD exit site: Multiple twists in driveline. - Driveline site a soft tissue defect- tunnel   around the driveline which extends at least 5 cm from the exit site. EXTREMITIES:  Warm  and dry, no cyanosis, clubbing, rash. No peripheral edema. NEUROLOGIC:  Alert and oriented x 4.  Gait steady.  No aphasia.  No dysarthria.  Affect pleasant.     Telemetry: Not yet connected  Labs: Basic Metabolic Panel: No results for input(s): NA, K, CL, CO2, GLUCOSE, BUN, CREATININE, CALCIUM, MG, PHOS in the last 168 hours.  Liver Function Tests: No results for input(s): AST, ALT, ALKPHOS, BILITOT, PROT, ALBUMIN in the last 168 hours. No results for input(s): LIPASE, AMYLASE in the last 168 hours. No results for input(s): AMMONIA in the last 168 hours.  CBC: No results for input(s): WBC, NEUTROABS, HGB, HCT, MCV, PLT in the last 168 hours.  Cardiac Enzymes: No results for input(s): CKTOTAL, CKMB, CKMBINDEX, TROPONINI in the last 168 hours.  BNP: BNP (last 3 results)  Recent Labs  06/19/15 1105 02/03/16 1243  BNP 66.3 66.1    ProBNP (last 3 results) No results for input(s): PROBNP in the last 8760 hours.   CBG: No results for input(s): GLUCAP in the last 168 hours.  Coagulation Studies: No results for input(s): LABPROT, INR in the last 72 hours.  Other results:  Imaging:  No results found.   Assessment/Plan   1) Chronic systolic HF, s/p HMII LVAD implant 05/2012.  - Stable from HF perspective at this time. NYHA I-II.  - Now dual listed at [facility] and [facility]. Recent RHC looks great.  - Intolerant to even low dose BB. - Continue current meds. Can use lasix as needed.  - Recent Driveline infection (see below) 2)  HTN-  - Continue currents meds. 3) Hyperthyroidism - Amio induced. Follows with Dr. Evlyn Kanner. Now off amio.  - Remains on tapazole and prednisone.  4) NSVT/VT  - Off amio. On mexilitene. - ICD now at EOL. 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  6) Anticoagulation with coumadin    - ASA 325 mg for LVAD. - Hold coumadin. Start heparin once INR drifts down.  7) LVAD  - Improved on 9600. Continue transplant process. Parameters stable. Did have numerous PI events on 9/25.  8) Driveline infection, recurrent  --  Wound cultures with Serratia. Bcx negative. Back on po cipro. Following with ID --Discussed with [facility] regarding possible Ia time due to recurrent Driveline infection but in order to prioritize would require culture data and evidence of deep infection (need for debridement, etc).  -- Plan for debridement 03/11/16 with Dr. Donata ClayVan Trigt  Admitting for driveline debridement 03/11/16 as above.  Start heparin once INR < 1.8  I reviewed the LVAD parameters from today, and compared the results to the patient's prior recorded data.  No programming changes were made.  The LVAD is functioning within specified parameters.  The patient performs LVAD self-test daily.  LVAD interrogation was negative for any significant power changes, alarms or PI events/speed drops.  LVAD equipment check completed and is in good working order.  Back-up equipment present.   LVAD education done on emergency procedures and precautions and reviewed exit site care.  Length of Stay: 0  Luane SchoolMichael Andrew Tillery, PA-C 03/09/2016, 3:02 PM  VAD Team Pager [phone] (7am - 7am) +++VAD ISSUES ONLY+++  Advanced Heart Failure Team Pager [phone] (M-F; 7a - 4p)  Please contact [facility] for night-coverage after hours (4p -7a ) and weekends on [email or web address] for all non- LVAD Issues   Patient seen with PA.  60yom with severe systolic HF and now s/p VAD placement c/b recurrent driveline infection.  He has failed multiple rounds of antibiotics as outpatient.  Now being admitted for wound debridement by Dr. Maren BeachVanTrigt.  Continue to hold warfarin.  Start heparin when INR < 2.  OR on Friday 12/8.  VAD parameters stable.  Jolana Runkles,MD 11:20 PM

## 2016-03-09 NOTE — Progress Notes (Signed)
Patient doppler MAP is 105. Followed up with automatic MAP and it was 103. VAD coordinator called and notified. Verbal orders to go ahead and give 2200 Entresto. Will initiate orders and continue to monitor patient.

## 2016-03-09 NOTE — Progress Notes (Signed)
ANTICOAGULATION CONSULT NOTE - Initial Consult  Pharmacy Consult for heparin Indication: LVAD  Allergies  Allergen Reactions  . Imdur [Isosorbide Nitrate] Other (See Comments)    Headache  . Metoprolol     Passes out    Patient Measurements: Height: 6\' 2"  (188 cm) Weight: 252 lb 2 oz (114.4 kg) IBW/kg (Calculated) : 82.2 Heparin Dosing Weight: 106  Vital Signs: Temp: 98.6 F (37 C) (12/05 1700) Temp Source: Oral (12/05 1700) BP: 113/97 (12/05 1700) Pulse Rate: 93 (12/05 1700)  Labs:  Recent Labs  03/09/16 1811  LABPROT 21.1*  INR 1.80    CrCl cannot be calculated (Patient's most recent lab result is older than the maximum 21 days allowed.).   Medical History: Past Medical History:  Diagnosis Date  . AICD (automatic cardioverter/defibrillator) present 2011  . Bronchitis   . Congestive heart failure (HCC) 2011  . Hypertension 2000  . LVAD (left ventricular assist device) present (HCC) 2014  . Pneumonia   . 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 status post dual chamber Medtronic ICD implanted April 2011.  Patient is on chronic coumadin for LVAD, he is being admitted for possible driveline exchange or debridement. His warfarin is on hold and he will be started on heparin tonight as his INR is now down to 1.8. Will not bolus given INR.   Goal of Therapy:  Heparin level 0.3-0.7 units/ml Monitor platelets by anticoagulation protocol: Yes   Plan:  Start heparin at 1600 units/hr Check 6 hour heparin level and cbc Will check INR in am  Sheppard Coil PharmD., BCPS Clinical Pharmacist Pager [phone] 03/09/2016 7:58 PM

## 2016-03-10 ENCOUNTER — Other Ambulatory Visit (HOSPITAL_COMMUNITY): Payer: Medicaid Other

## 2016-03-10 DIAGNOSIS — T829XXD Unspecified complication of cardiac and vascular prosthetic device, implant and graft, subsequent encounter: Secondary | ICD-10-CM

## 2016-03-10 DIAGNOSIS — I10 Essential (primary) hypertension: Secondary | ICD-10-CM

## 2016-03-10 LAB — CBC
HCT: 39.1 % (ref 39.0–52.0)
HEMOGLOBIN: 13.1 g/dL (ref 13.0–17.0)
MCH: 28.5 pg (ref 26.0–34.0)
MCHC: 33.5 g/dL (ref 30.0–36.0)
MCV: 85 fL (ref 78.0–100.0)
PLATELETS: 146 10*3/uL — AB (ref 150–400)
RBC: 4.6 MIL/uL (ref 4.22–5.81)
RDW: 15 % (ref 11.5–15.5)
WBC: 6.4 10*3/uL (ref 4.0–10.5)

## 2016-03-10 LAB — BASIC METABOLIC PANEL
ANION GAP: 6 (ref 5–15)
BUN: 9 mg/dL (ref 6–20)
CALCIUM: 8.5 mg/dL — AB (ref 8.9–10.3)
CO2: 27 mmol/L (ref 22–32)
CREATININE: 1 mg/dL (ref 0.61–1.24)
Chloride: 105 mmol/L (ref 101–111)
Glucose, Bld: 88 mg/dL (ref 65–99)
Potassium: 3.6 mmol/L (ref 3.5–5.1)
Sodium: 138 mmol/L (ref 135–145)

## 2016-03-10 LAB — PROTIME-INR
INR: 1.63
PROTHROMBIN TIME: 19.6 s — AB (ref 11.4–15.2)

## 2016-03-10 LAB — HEPARIN LEVEL (UNFRACTIONATED): HEPARIN UNFRACTIONATED: 0.48 [IU]/mL (ref 0.30–0.70)

## 2016-03-10 LAB — LACTATE DEHYDROGENASE: LDH: 194 U/L — AB (ref 98–192)

## 2016-03-10 MED ORDER — AMLODIPINE BESYLATE 10 MG PO TABS
10.0000 mg | ORAL_TABLET | Freq: Every day | ORAL | Status: DC
  Administered 2016-03-10: 10 mg via ORAL
  Filled 2016-03-10: qty 1

## 2016-03-10 MED ORDER — PNEUMOCOCCAL VAC POLYVALENT 25 MCG/0.5ML IJ INJ
0.5000 mL | INJECTION | INTRAMUSCULAR | Status: DC
Start: 2016-03-11 — End: 2016-03-20
  Filled 2016-03-10 (×2): qty 0.5

## 2016-03-10 MED ORDER — INFLUENZA VAC SPLIT QUAD 0.5 ML IM SUSY
0.5000 mL | PREFILLED_SYRINGE | INTRAMUSCULAR | Status: DC
  Filled 2016-03-10: qty 0.5

## 2016-03-10 NOTE — Progress Notes (Signed)
Advanced Heart Failure VAD Team Note  Subjective:    Admitted 03/09/16 for heparin bridge for Driveline debridement scheduled for 03/12/16.   Started on Heparin 03/09/16 with INR 1.8 => 1.63 today.   Feeling good this morning. States he didn't take his medications yesterday morning prior to admission, which may be why his pressures are elevated. No headache, numbness, paraesthesias, or blurred vision.  LVAD INTERROGATION:  HeartMate II LVAD:  Flow 5.2 liters/min, speed 9400, power 5.4, PI 7.8.   Objective:    Vital Signs:   Temp:  [97.5 F (36.4 C)-98.6 F (37 C)] 97.6 F (36.4 C) (12/06 0731) Pulse Rate:  [54-93] 55 (12/06 0400) Resp:  [14-25] 15 (12/06 0400) BP: (99-131)/(82-107) 119/107 (12/06 0731) SpO2:  [96 %-100 %] 99 % (12/06 0731) Weight:  [249 lb (112.9 kg)-252 lb 2 oz (114.4 kg)] 249 lb (112.9 kg) (12/06 0400) Last BM Date:  (PTA )   Mean arterial Pressure 90-100s this am.   Intake/Output:   Intake/Output Summary (Last 24 hours) at 03/10/16 0819 Last data filed at 03/10/16 0700  Gross per 24 hour  Intake            141.6 ml  Output              550 ml  Net           -408.4 ml     Physical Exam: GENERAL: WDWN, NAD.  HEENT: Normal NECK: Supple, JVP 7-8; 2+ bilaterally, no bruits. No thyromegaly or nodule noted.  CARDIAC: Mechanical heart sounds with LVAD hum present.  LUNGS: CTAB, normal effort ABDOMEN: Soft, round, NT, ND, no HSM. No bruits or masses. +BS  LVAD exit site: Multiple twists in driveline. - Driveline site a soft tissue defect-tunnel around the driveline which extends at least 5 cm from the exit site. EXTREMITIES: Warm and dry, no cyanosis, clubbing, rash. No peripheral edema. NEUROLOGIC: Alert and oriented x 4. Gait steady. No aphasia. No dysarthria.Affect pleasant.   Telemetry:   Labs: Basic Metabolic Panel:  Recent Labs Lab 03/10/16 0432  NA 138  K 3.6  CL 105  CO2 27  GLUCOSE 88  BUN 9  CREATININE 1.00    CALCIUM 8.5*    Liver Function Tests: No results for input(s): AST, ALT, ALKPHOS, BILITOT, PROT, ALBUMIN in the last 168 hours. No results for input(s): LIPASE, AMYLASE in the last 168 hours. No results for input(s): AMMONIA in the last 168 hours.  CBC:  Recent Labs Lab 03/10/16 0432  WBC 6.4  HGB 13.1  HCT 39.1  MCV 85.0  PLT 146*    INR:  Recent Labs Lab 03/09/16 1811 03/10/16 0432  INR 1.80 1.63    Other results:  EKG:   Imaging:  No results found.   Medications:     Scheduled Medications: . allopurinol  300 mg Oral Daily  . aspirin EC  325 mg Oral Daily  . mexiletine  150 mg Oral BID  . multivitamin with minerals  1 tablet Oral Daily  . pantoprazole  40 mg Oral Daily  . sacubitril-valsartan  1 tablet Oral BID     Infusions: . heparin 1,600 Units/hr (03/10/16 0700)     PRN Medications:  traMADol   Assessment/Plan   1) Chronic systolic HF, s/p HMII LVAD implant 05/2012.  - Stable from HF perspective at this time. NYHA I-II.  - [facility]. Recent RHC looks great.  - Intolerant to even low dose BB. -  Continue current meds. Can use lasix as needed.  - Recent Driveline infection (see below) 2) HTN-  - His amlodipine has somehow fallen off since last MD visit.  Will resume at 10 mg daily with MAPs 90-100s 3) Hyperthyroidism - Amio induced. Follows with Dr. Evlyn Kanner. Now off amio.  - He is now off methimazole.  4) NSVT/VT - Off amio. On mexilitene. - ICD now at EOL. 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  6) Anticoagulation with coumadin - ASA 325 mg for LVAD. - Continue heparin. Will need to bring INR back up once stable after surgery.  7) LVAD - Stable on 9400. Continue transplant process. Parameters stable. 8) Driveline infection,  recurrent  - Bcx negative. Following with ID - Wound culture 02/25/16 with Serratia again. Not sensitive to Cipro. Not currently on po ABX.  - Discussed with [facility] regarding possible Ia time due to recurrent Driveline infection but in order to prioritize would require culture data and evidence of deep infection (need for debridement, etc).  - Plan for debridement, now on 03/12/16 with Dr. Donata Clay. Moved with upcoming LVAD implant tomorrow, 03/11/16.  I reviewed the LVAD parameters from today, and compared the results to the patient's prior recorded data.  No programming changes were made.  The LVAD is functioning within specified parameters.  The patient performs LVAD self-test daily.  LVAD interrogation was negative for any significant power changes, alarms or PI events/speed drops.  LVAD equipment check completed and is in good working order.  Back-up equipment present.   LVAD education done on emergency procedures and precautions and reviewed exit site care.  Length of Stay: 1  Luane School 03/10/2016, 8:19 AM  VAD Team --- VAD ISSUES ONLY--- Pager [phone] (7am - 7am)  Patient seen with PA, agree with the above note.  He will have driveline exit site debridement on Friday due to recurrent infection.  INR down to 1.63, on heparin bridge.   BP is high. Has not been on amlodipine at home, not sure why.  Restart today.   Marca Ancona 03/10/2016 9:57 AM   Advanced Heart Failure Team  Pager [phone] (M-F; 7a - 4p)  Please contact [facility] for night-coverage after hours (4p -7a ) and weekends on [email or web address]

## 2016-03-10 NOTE — Progress Notes (Signed)
VAD coordinator called and notified regarding patient HR dropping to 46 and doppler MAP of 95 with an automatic MAP of 99. No new orders given at this time. Patient asymptomatic.

## 2016-03-10 NOTE — Progress Notes (Addendum)
12pm doppler MAP 96, text page sent to Dr. Shirlee Latch, no new orders at this time. Pt is asymptomatic at this time, ambulating safely in room; will continue to monitor.

## 2016-03-10 NOTE — Progress Notes (Signed)
ANTICOAGULATION CONSULT NOTE - Follow Up Consult  Pharmacy Consult for heparin Indication: LVAD  Allergies  Allergen Reactions  . Imdur [Isosorbide Nitrate] Other (See Comments)    Headache  . Metoprolol     Passes out    Patient Measurements: Height: 6\' 2"  (188 cm) Weight: 249 lb (112.9 kg) IBW/kg (Calculated) : 82.2 Heparin Dosing Weight: 106  Vital Signs: Temp: 97.6 F (36.4 C) (12/06 0731) Temp Source: Oral (12/06 0731) BP: 119/107 (12/06 0731) Pulse Rate: 55 (12/06 0400)  Labs:  Recent Labs  03/09/16 1811 03/10/16 0432  HGB  --  13.1  HCT  --  39.1  PLT  --  146*  LABPROT 21.1* 19.6*  INR 1.80 1.63  HEPARINUNFRC  --  0.48  CREATININE  --  1.00    Estimated Creatinine Clearance: 105 mL/min (by C-G formula based on SCr of 1 mg/dL).   Medical History: Past Medical History:  Diagnosis Date  . AICD (automatic cardioverter/defibrillator) present 2011  . Bronchitis   . Congestive heart failure (HCC) 2011  . Hypertension 2000  . LVAD (left ventricular assist device) present (HCC) 2014  . Pneumonia   . 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  s/p HM II LVAD implant 05/2012  Patient is on chronic coumadin for LVAD, he is being admitted for driveline debridement. His warfarin is on hold for procedure and he was started on heparin drip for  INR < 1.8. Will not bolus given INR elevated. CBC stable, Heparin drip  1600 uts/hr HL 0.48 at goal.  Goal of Therapy:  Heparin level 0.3-0.7 units/ml Monitor platelets by anticoagulation protocol: Yes   Plan:  Continue  heparin at 1600 units/hr CBC, HL daily Will check INR in am  Leota Sauers Pharm.D. CPP, BCPS Clinical Pharmacist [phone] 03/10/2016 2:14 PM

## 2016-03-11 DIAGNOSIS — L089 Local infection of the skin and subcutaneous tissue, unspecified: Secondary | ICD-10-CM

## 2016-03-11 DIAGNOSIS — T148XXA Other injury of unspecified body region, initial encounter: Secondary | ICD-10-CM

## 2016-03-11 DIAGNOSIS — I9789 Other postprocedural complications and disorders of the circulatory system, not elsewhere classified: Secondary | ICD-10-CM

## 2016-03-11 DIAGNOSIS — T829XXA Unspecified complication of cardiac and vascular prosthetic device, implant and graft, initial encounter: Secondary | ICD-10-CM

## 2016-03-11 LAB — CBC
HCT: 42.6 % (ref 39.0–52.0)
Hemoglobin: 14.2 g/dL (ref 13.0–17.0)
MCH: 28.2 pg (ref 26.0–34.0)
MCHC: 33.3 g/dL (ref 30.0–36.0)
MCV: 84.7 fL (ref 78.0–100.0)
PLATELETS: 180 10*3/uL (ref 150–400)
RBC: 5.03 MIL/uL (ref 4.22–5.81)
RDW: 15.1 % (ref 11.5–15.5)
WBC: 7.9 10*3/uL (ref 4.0–10.5)

## 2016-03-11 LAB — TYPE AND SCREEN
ABO/RH(D): O POS
Antibody Screen: NEGATIVE

## 2016-03-11 LAB — PROTIME-INR
INR: 1.3
Prothrombin Time: 16.3 seconds — ABNORMAL HIGH (ref 11.4–15.2)

## 2016-03-11 LAB — HEPARIN LEVEL (UNFRACTIONATED)
HEPARIN UNFRACTIONATED: 0.85 [IU]/mL — AB (ref 0.30–0.70)
Heparin Unfractionated: 0.62 IU/mL (ref 0.30–0.70)

## 2016-03-11 LAB — LACTATE DEHYDROGENASE: LDH: 217 U/L — ABNORMAL HIGH (ref 98–192)

## 2016-03-11 MED ORDER — DEXTROSE 5 % IV SOLN
1.0000 g | Freq: Two times a day (BID) | INTRAVENOUS | Status: DC
  Administered 2016-03-11: 1 g via INTRAVENOUS
  Filled 2016-03-11 (×3): qty 1

## 2016-03-11 MED ORDER — VANCOMYCIN HCL 10 G IV SOLR
1500.0000 mg | INTRAVENOUS | Status: AC
  Administered 2016-03-12: 1500 mg via INTRAVENOUS
  Filled 2016-03-11: qty 1500

## 2016-03-11 MED ORDER — AMLODIPINE BESYLATE 5 MG PO TABS: 5.0000 mg | ORAL_TABLET | Freq: Every day | ORAL | Status: DC

## 2016-03-11 MED ORDER — HYDRALAZINE HCL 25 MG PO TABS
25.0000 mg | ORAL_TABLET | Freq: Two times a day (BID) | ORAL | Status: DC
  Administered 2016-03-11 – 2016-03-15 (×8): 25 mg via ORAL
  Filled 2016-03-11 (×9): qty 1

## 2016-03-11 MED ORDER — HEPARIN (PORCINE) IN NACL 100-0.45 UNIT/ML-% IJ SOLN
1350.0000 [IU]/h | INTRAMUSCULAR | Status: DC
  Administered 2016-03-11: 1350 [IU]/h via INTRAVENOUS
  Filled 2016-03-11: qty 250

## 2016-03-11 NOTE — Progress Notes (Signed)
Heparin dose changed to 1400 Units/hr.

## 2016-03-11 NOTE — Progress Notes (Signed)
ANTICOAGULATION CONSULT NOTE - Follow Up Consult  Pharmacy Consult for heparin Indication: LVAD  Labs:  Recent Labs  03/09/16 1811 03/10/16 0432 03/11/16 0323  HGB  --  13.1 14.2  HCT  --  39.1 42.6  PLT  --  146* 180  LABPROT 21.1* 19.6* 16.3*  INR 1.80 1.63 1.30  HEPARINUNFRC  --  0.48 0.85*  CREATININE  --  1.00  --      Assessment: 60yo male now above goal on heparin after one level at goal.  Goal of Therapy:  Heparin level 0.3-0.7 units/ml   Plan:  Will decrease heparin gtt by 2 units/kg/hr to 1400 units/hr and check level in 6hr.  Vernard Gambles, PharmD, BCPS  03/11/2016,4:54 AM

## 2016-03-11 NOTE — Progress Notes (Signed)
Patient currently listed IB at [facility] and [facility]. 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 Will continue to update with progress during current admission .   Rexene Alberts, RN VAD Coordinator   Office: [phone] 24/7 Emergency VAD Pager: [phone]

## 2016-03-11 NOTE — Progress Notes (Signed)
ANTICOAGULATION CONSULT NOTE - Follow Up Consult  Pharmacy Consult for heparin Indication: LVAD  Allergies  Allergen Reactions  . Metoprolol Other (See Comments)    Passes out  . Imdur [Isosorbide Nitrate] Other (See Comments)    HEADACHE--REACTION IS SIDE EFFECT OF NITRATES    Patient Measurements: Height: 6\' 2"  (188 cm) Weight: 247 lb (112 kg) IBW/kg (Calculated) : 82.2 Heparin Dosing Weight: 106  Vital Signs: Temp: 97.6 F (36.4 C) (12/07 0700) Temp Source: Oral (12/07 0700) BP: 96/83 (12/07 1100) Pulse Rate: 82 (12/07 0330)  Labs:  Recent Labs  03/09/16 1811 03/10/16 0432 03/11/16 0323 03/11/16 1056  HGB  --  13.1 14.2  --   HCT  --  39.1 42.6  --   PLT  --  146* 180  --   LABPROT 21.1* 19.6* 16.3*  --   INR 1.80 1.63 1.30  --   HEPARINUNFRC  --  0.48 0.85* 0.62  CREATININE  --  1.00  --   --     Estimated Creatinine Clearance: 104.6 mL/min (by C-G formula based on SCr of 1 mg/dL).   Medical History: Past Medical History:  Diagnosis Date  . AICD (automatic cardioverter/defibrillator) present 2011  . Bronchitis   . Congestive heart failure (HCC) 2011  . Hypertension 2000  . LVAD (left ventricular assist device) present (HCC) 2014  . Pneumonia   . 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  s/p HM II LVAD implant 05/2012  Patient is on chronic coumadin for LVAD, he is being admitted for driveline debridement. His warfarin is on hold for procedure and he was started on heparin drip for  INR < 1.8. Will not bolus given INR elevated. CBC stable, Heparin drip  1400 uts/hr HL 0.6 at upper end of goal - will drop slightly so not too much bleeding during debridement.  Goal of Therapy:  Heparin level 0.3-0.7 units/ml Monitor platelets by anticoagulation protocol: Yes   Plan:  Decrease heparin at  1350 units/hr CBC, HL daily Will check INR in am  Leota Sauers Pharm.D. CPP, BCPS Clinical Pharmacist [phone] 03/11/2016 12:22 PM

## 2016-03-11 NOTE — Progress Notes (Addendum)
Advanced Heart Failure VAD Team Note  Subjective:    Admitted 03/09/16 for heparin bridge for Driveline debridement scheduled for 03/12/16.   Started on Heparin 03/09/16 with INR 1.8.  INR 1.3 today.   No complaints this am.  MAPs remain in 90-100s. No HAs  LVAD INTERROGATION:  HeartMate II LVAD:  Flow 437 liters/min, speed 9400, power 5.3, PI 7.6.   Objective:    Vital Signs:   Temp:  [97.6 F (36.4 C)-98.2 F (36.8 C)] 97.6 F (36.4 C) (12/07 0700) Pulse Rate:  [63-82] 82 (12/07 0330) Resp:  [19-23] 19 (12/07 0330) BP: (104-120)/(91-100) 105/92 (12/07 0700) SpO2:  [97 %-100 %] 97 % (12/07 0330) Weight:  [247 lb (112 kg)] 247 lb (112 kg) (12/07 0330) Last BM Date: 03/10/16   Mean arterial Pressure 96  Intake/Output:   Intake/Output Summary (Last 24 hours) at 03/11/16 0805 Last data filed at 03/11/16 0300  Gross per 24 hour  Intake              424 ml  Output              458 ml  Net              -34 ml     Physical Exam: GENERAL: WDWN, NAD.  HEENT: Normal NECK: Supple, JVP ~7-8; 2+ bilaterally, no bruits. No thyromegaly or nodule noted.  CARDIAC: Mechanical heart sounds with LVAD hum present.  LUNGS: Clear, normal effort. ABDOMEN: Soft, round, NT, ND, no HSM. No bruits or masses. +BS  LVAD exit site: Multiple twists in driveline. - Driveline site a soft tissue defect-tunnel around the driveline which extends at least 5 cm from the exit site. EXTREMITIES: Warm and dry, no cyanosis, clubbing, rash. No peripheral edema. NEUROLOGIC: Alert and oriented x 4. Gait steady. No aphasia. No dysarthria.Affect pleasant.   Telemetry: NSR  Labs: Basic Metabolic Panel:  Recent Labs Lab 03/10/16 0432  NA 138  K 3.6  CL 105  CO2 27  GLUCOSE 88  BUN 9  CREATININE 1.00  CALCIUM 8.5*    Liver Function Tests: No results for input(s): AST, ALT, ALKPHOS, BILITOT, PROT, ALBUMIN in the last 168 hours. No results for input(s): LIPASE, AMYLASE in the last  168 hours. No results for input(s): AMMONIA in the last 168 hours.  CBC:  Recent Labs Lab 03/10/16 0432 03/11/16 0323  WBC 6.4 7.9  HGB 13.1 14.2  HCT 39.1 42.6  MCV 85.0 84.7  PLT 146* 180    INR:  Recent Labs Lab 03/09/16 1811 03/10/16 0432 03/11/16 0323  INR 1.80 1.63 1.30    Other results:    Imaging: No results found.   Medications:     Scheduled Medications: . allopurinol  300 mg Oral Daily  . amLODipine  10 mg Oral Daily  . aspirin EC  325 mg Oral Daily  . Influenza vac split quadrivalent PF  0.5 mL Intramuscular Tomorrow-1000  . mexiletine  150 mg Oral BID  . multivitamin with minerals  1 tablet Oral Daily  . pantoprazole  40 mg Oral Daily  . pneumococcal 23 valent vaccine  0.5 mL Intramuscular Tomorrow-1000  . sacubitril-valsartan  1 tablet Oral BID    Infusions: . heparin 1,400 Units/hr (03/11/16 0457)    PRN Medications: traMADol   Assessment/Plan    1) Driveline infection, recurrent  - Bcx negative. Following with ID - Wound culture 02/25/16 with Serratia again. Not sensitive to Cipro. Not currently on po ABX.  -  Discussed with [facility] regarding possible Ia time due to recurrent Driveline infection but in order to prioritize would require culture data and evidence of deep infection (need for debridement, etc).  -Plan for debridement, tomorrow, 03/12/16 with Dr. Donata Clay.  2) Chronic systolic HF, s/p HMII LVAD implant 05/2012.  - NYHA I-II. Stable at this time. - Now dual listed at [facility] and [facility]. Recent RHC looks great.  - Intolerant to even low dose BB. - Continue current meds. Can use lasix as needed.  - Recent Driveline infection (see below) 3) HTN-  - Reduce amlodipine to 5 mg daily which he has been on previously - Has previously been on hydralazine and was effective.  Will add back at low dose and follow. Can adjust further after surgery tomorrow.  4) NSVT/VT - Off amio. On mexilitene. - ICD now at EOL. We have decided to not  replace given that risk of infection felt to be higher than benefit or replacing as VT usually well tolerated in setting of VAD support 5) PVCs - Much improved with treatment of hyperthyroidism.  6) Anticoagulation with coumadin - ASA 325 mg for LVAD. - Continue heparin. Will need to bring INR back up once stable after surgery.  7) LVAD - Stable on 9400. Continue transplant process. Parameters s I reviewed the LVAD parameters from today, and compared the results to the patient's prior recorded data.  No programming changes were made.  The LVAD is functioning within specified parameters.  The patient performs LVAD self-test daily.  LVAD interrogation was negative for any significant power changes, alarms or PI events/speed drops.  LVAD equipment check completed and is in good working order.  Back-up equipment present.   LVAD education done on emergency procedures and precautions and reviewed exit site care. 9) Hyperthyroidism - Amio induced. Follows with Dr. Evlyn Kanner. Now off amio.  - He is now off methimazole.   Length of Stay: 2  Luane School 03/11/2016, 8:05 AM  VAD Team --- VAD ISSUES ONLY--- Pager [phone] (7am - 7am)  Advanced Heart Failure Team Pager [phone] (M-F; 7a - 4p)  Please contact [facility] for night-coverage after hours (4p -7a ) and weekends on [email or web address]   Patient seen and examined with Otilio Saber, PA-C. We discussed all aspects of the encounter. I agree with the assessment and plan as stated above.   Remains stable. INR down. On heparin. No fevers or chills. VAD parameters and HF stable. For wound I&D tomorrow. Agree with hydralazine for HTN.   Seichi Kaufhold,MD 8:56 PM

## 2016-03-11 NOTE — Progress Notes (Signed)
Procedure(s) (LRB): WOUND DEBRIDEMENT AT LVAD SITE (N/A) APPLICATION OF WOUND VAC (N/A) TRANSESOPHAGEAL ECHOCARDIOGRAM (TEE) (N/A) Subjective: Patient examined and procedure for tomorrow morning discussed-debridement of VAD driveline exit site and wound VAC placement  60 year old male status post implantation of HeartMate 2 for nonischemic cardiomyopathy. The patient developed a superficial infection of the driveline exit site and has cultured Serratia marcescens. Outpatient treatment has been unsuccessful. The patient has been admitted for IV heparin bridge and Coumadin washout prior to surgical debridement and wound VAC placement tomorrow. Patient has been started on IV Maxipime to cover the organism. The patient's INR is 1.3. He is ready for surgery in a.m.  Objective: Vital signs in last 24 hours: Temp:  [97.6 F (36.4 C)-98.2 F (36.8 C)] 97.6 F (36.4 C) (12/07 0700) Pulse Rate:  [63-82] 82 (12/07 0330) Cardiac Rhythm: Normal sinus rhythm;Heart block (12/07 1600) Resp:  [14-23] 19 (12/07 1100) BP: (96-115)/(83-100) 96/83 (12/07 1100) SpO2:  [97 %-98 %] 97 % (12/07 0330) Weight:  [247 lb (112 kg)] 247 lb (112 kg) (12/07 0330)  Hemodynamic parameters for last 24 hours:  stable  Intake/Output from previous day: 12/06 0701 - 12/07 0700 In: 440 [P.O.:120; I.V.:320] Out: 458 [Urine:457; Stool:1] Intake/Output this shift: Total I/O In: 811.1 [P.O.:600; I.V.:211.1] Out: -        Exam    General- alert and comfortable   Lungs- clear without rales, wheezes   Cor- regular rate and rhythm, no murmur , gallop   Abdomen- soft, non-tender   Extremities - warm, non-tender, minimal edema   Neuro- oriented, appropriate, no focal weakness   Lab Results:  Recent Labs  03/10/16 0432 03/11/16 0323  WBC 6.4 7.9  HGB 13.1 14.2  HCT 39.1 42.6  PLT 146* 180   BMET:  Recent Labs  03/10/16 0432  NA 138  K 3.6  CL 105  CO2 27  GLUCOSE 88  BUN 9  CREATININE 1.00  CALCIUM  8.5*    PT/INR:  Recent Labs  03/11/16 0323  LABPROT 16.3*  INR 1.30   ABG    Component Value Date/Time   PHART 7.399 06/04/2012 0312   HCO3 24.9 (H) 11/06/2015 0825   TCO2 26 11/06/2015 0825   ACIDBASEDEF 2.0 06/10/2014 1337   O2SAT 69.0 11/06/2015 0825   CBG (last 3)  No results for input(s): GLUCAP in the last 72 hours.  Assessment/Plan: S/P Procedure(s) (LRB): WOUND DEBRIDEMENT AT LVAD SITE (N/A) APPLICATION OF WOUND VAC (N/A) TRANSESOPHAGEAL ECHOCARDIOGRAM (TEE) (N/A) Plan debridement of VAD driveline exit site in a.m. under general anesthesia.   LOS: 2 days    Kathlee Nations Trigt III 03/11/2016

## 2016-03-12 ENCOUNTER — Inpatient Hospital Stay (HOSPITAL_COMMUNITY): Payer: Medicaid Other

## 2016-03-12 ENCOUNTER — Inpatient Hospital Stay (HOSPITAL_COMMUNITY): Payer: Medicaid Other | Admitting: Certified Registered"

## 2016-03-12 ENCOUNTER — Encounter (HOSPITAL_COMMUNITY)
Admission: RE | Disposition: A | Discharge status: Home Health | Payer: Self-pay | Source: Ambulatory Visit | Attending: Internal Medicine

## 2016-03-12 DIAGNOSIS — L089 Local infection of the skin and subcutaneous tissue, unspecified: Secondary | ICD-10-CM

## 2016-03-12 DIAGNOSIS — Z95811 Presence of heart assist device: Secondary | ICD-10-CM

## 2016-03-12 HISTORY — PX: APPLICATION OF WOUND VAC: SHX5189

## 2016-03-12 HISTORY — PX: STERNAL WOUND DEBRIDEMENT: SHX1058

## 2016-03-12 LAB — URINALYSIS, ROUTINE W REFLEX MICROSCOPIC
BILIRUBIN URINE: NEGATIVE
Glucose, UA: NEGATIVE mg/dL
Hgb urine dipstick: NEGATIVE
KETONES UR: NEGATIVE mg/dL
LEUKOCYTES UA: NEGATIVE
NITRITE: NEGATIVE
PH: 6 (ref 5.0–8.0)
Protein, ur: NEGATIVE mg/dL
SPECIFIC GRAVITY, URINE: 1.011 (ref 1.005–1.030)

## 2016-03-12 LAB — BLOOD GAS, ARTERIAL
Acid-Base Excess: 0.7 mmol/L (ref 0.0–2.0)
Bicarbonate: 24.9 mmol/L (ref 20.0–28.0)
Drawn by: 437071
O2 Saturation: 95.8 %
Patient temperature: 98.6
pCO2 arterial: 40.7 mmHg (ref 32.0–48.0)
pH, Arterial: 7.404 (ref 7.350–7.450)
pO2, Arterial: 84.2 mmHg (ref 83.0–108.0)

## 2016-03-12 LAB — CBC
HCT: 40.2 % (ref 39.0–52.0)
Hemoglobin: 13.4 g/dL (ref 13.0–17.0)
MCH: 28.5 pg (ref 26.0–34.0)
MCHC: 33.3 g/dL (ref 30.0–36.0)
MCV: 85.4 fL (ref 78.0–100.0)
Platelets: 161 10*3/uL (ref 150–400)
RBC: 4.71 MIL/uL (ref 4.22–5.81)
RDW: 15.1 % (ref 11.5–15.5)
WBC: 7.5 10*3/uL (ref 4.0–10.5)

## 2016-03-12 LAB — APTT: aPTT: 140 seconds — ABNORMAL HIGH (ref 24–36)

## 2016-03-12 LAB — SURGICAL PCR SCREEN
MRSA, PCR: NEGATIVE
Staphylococcus aureus: NEGATIVE

## 2016-03-12 LAB — HEPARIN LEVEL (UNFRACTIONATED): HEPARIN UNFRACTIONATED: 0.3 [IU]/mL (ref 0.30–0.70)

## 2016-03-12 LAB — LACTATE DEHYDROGENASE: LDH: 176 U/L (ref 98–192)

## 2016-03-12 LAB — PROTIME-INR
INR: 1.22
Prothrombin Time: 15.5 seconds — ABNORMAL HIGH (ref 11.4–15.2)

## 2016-03-12 SURGERY — DEBRIDEMENT, WOUND, STERNUM
Anesthesia: General

## 2016-03-12 MED ORDER — ALBUMIN HUMAN 5 % IV SOLN
INTRAVENOUS | Status: DC | PRN
  Administered 2016-03-12: 08:00:00 via INTRAVENOUS

## 2016-03-12 MED ORDER — DEXTROSE-NACL 5-0.45 % IV SOLN: INTRAVENOUS | Status: DC

## 2016-03-12 MED ORDER — MIDAZOLAM HCL 5 MG/5ML IJ SOLN
INTRAMUSCULAR | Status: DC | PRN
  Administered 2016-03-12 (×2): 1 mg via INTRAVENOUS

## 2016-03-12 MED ORDER — SODIUM CHLORIDE 0.9 % IR SOLN
Status: DC | PRN
  Administered 2016-03-12: 500 mL

## 2016-03-12 MED ORDER — BISACODYL 5 MG PO TBEC
10.0000 mg | DELAYED_RELEASE_TABLET | Freq: Every day | ORAL | Status: DC
  Administered 2016-03-12 – 2016-03-20 (×8): 10 mg via ORAL
  Filled 2016-03-12 (×9): qty 2

## 2016-03-12 MED ORDER — PHENYLEPHRINE HCL 10 MG/ML IJ SOLN
INTRAMUSCULAR | Status: DC | PRN
  Administered 2016-03-12 (×2): 80 ug via INTRAVENOUS

## 2016-03-12 MED ORDER — GLYCOPYRROLATE 0.2 MG/ML IJ SOLN
INTRAMUSCULAR | Status: DC | PRN
  Administered 2016-03-12: 0.6 mg via INTRAVENOUS

## 2016-03-12 MED ORDER — OXYCODONE HCL 5 MG PO TABS: 5.0000 mg | ORAL_TABLET | ORAL | Status: DC | PRN

## 2016-03-12 MED ORDER — LACTATED RINGERS IV SOLN
INTRAVENOUS | Status: DC | PRN
  Administered 2016-03-12: 07:00:00 via INTRAVENOUS

## 2016-03-12 MED ORDER — PHENYLEPHRINE HCL 10 MG/ML IJ SOLN
INTRAVENOUS | Status: DC | PRN
  Administered 2016-03-12: 20 ug/min via INTRAVENOUS

## 2016-03-12 MED ORDER — VANCOMYCIN HCL 1000 MG IV SOLR
INTRAVENOUS | Status: DC
  Administered 2016-03-12: 10:00:00
  Filled 2016-03-12: qty 1000

## 2016-03-12 MED ORDER — VANCOMYCIN HCL 1000 MG IV SOLR
INTRAVENOUS | Status: AC
  Filled 2016-03-12: qty 1000

## 2016-03-12 MED ORDER — ETOMIDATE 2 MG/ML IV SOLN
INTRAVENOUS | Status: DC | PRN
  Administered 2016-03-12: 20 mg via INTRAVENOUS

## 2016-03-12 MED ORDER — ONDANSETRON HCL 4 MG/2ML IJ SOLN
4.0000 mg | Freq: Four times a day (QID) | INTRAMUSCULAR | Status: DC | PRN
  Administered 2016-03-12: 4 mg via INTRAVENOUS
  Filled 2016-03-12: qty 2

## 2016-03-12 MED ORDER — ACETAMINOPHEN 160 MG/5ML PO SOLN: 1000.0000 mg | Freq: Four times a day (QID) | ORAL | Status: AC

## 2016-03-12 MED ORDER — HEPARIN (PORCINE) IN NACL 100-0.45 UNIT/ML-% IJ SOLN
1250.0000 [IU]/h | INTRAMUSCULAR | Status: DC
  Administered 2016-03-12 – 2016-03-13 (×2): 1250 [IU]/h via INTRAVENOUS
  Filled 2016-03-12: qty 250

## 2016-03-12 MED ORDER — FENTANYL CITRATE (PF) 100 MCG/2ML IJ SOLN
INTRAMUSCULAR | Status: DC | PRN
  Administered 2016-03-12: 100 ug via INTRAVENOUS
  Administered 2016-03-12: 25 ug via INTRAVENOUS

## 2016-03-12 MED ORDER — FENTANYL CITRATE (PF) 100 MCG/2ML IJ SOLN: 25.0000 ug | INTRAMUSCULAR | Status: DC | PRN

## 2016-03-12 MED ORDER — ROCURONIUM BROMIDE 100 MG/10ML IV SOLN
INTRAVENOUS | Status: DC | PRN
  Administered 2016-03-12: 40 mg via INTRAVENOUS

## 2016-03-12 MED ORDER — LIDOCAINE HCL (CARDIAC) 20 MG/ML IV SOLN
INTRAVENOUS | Status: DC | PRN
  Administered 2016-03-12: 100 mg via INTRAVENOUS

## 2016-03-12 MED ORDER — PROPOFOL 10 MG/ML IV BOLUS
INTRAVENOUS | Status: DC | PRN
  Administered 2016-03-12: 20 mg via INTRAVENOUS

## 2016-03-12 MED ORDER — SENNOSIDES-DOCUSATE SODIUM 8.6-50 MG PO TABS
1.0000 | ORAL_TABLET | Freq: Every day | ORAL | Status: DC
  Administered 2016-03-16 – 2016-03-18 (×3): 1 via ORAL
  Filled 2016-03-12 (×5): qty 1

## 2016-03-12 MED ORDER — MIDAZOLAM HCL 2 MG/2ML IJ SOLN
INTRAMUSCULAR | Status: AC
  Filled 2016-03-12: qty 2

## 2016-03-12 MED ORDER — DEXTROSE 5 % IV SOLN
1.0000 g | Freq: Three times a day (TID) | INTRAVENOUS | Status: DC
  Administered 2016-03-12 – 2016-03-18 (×19): 1 g via INTRAVENOUS
  Filled 2016-03-12 (×20): qty 1

## 2016-03-12 MED ORDER — WARFARIN - PHYSICIAN DOSING INPATIENT
Freq: Every day | Status: DC
  Administered 2016-03-13 – 2016-03-15 (×3)

## 2016-03-12 MED ORDER — ONDANSETRON HCL 4 MG/2ML IJ SOLN
INTRAMUSCULAR | Status: DC | PRN
  Administered 2016-03-12: 4 mg via INTRAVENOUS

## 2016-03-12 MED ORDER — PROPOFOL 10 MG/ML IV BOLUS
INTRAVENOUS | Status: AC
  Filled 2016-03-12: qty 20

## 2016-03-12 MED ORDER — SODIUM CHLORIDE 0.9 % IR SOLN
Status: DC | PRN
  Administered 2016-03-12: 1000 mL

## 2016-03-12 MED ORDER — FENTANYL CITRATE (PF) 250 MCG/5ML IJ SOLN
INTRAMUSCULAR | Status: AC
  Filled 2016-03-12: qty 5

## 2016-03-12 MED ORDER — ACETAMINOPHEN 500 MG PO TABS
1000.0000 mg | ORAL_TABLET | Freq: Four times a day (QID) | ORAL | Status: AC
  Administered 2016-03-12 – 2016-03-17 (×15): 1000 mg via ORAL
  Filled 2016-03-12 (×17): qty 2

## 2016-03-12 MED ORDER — NEOSTIGMINE METHYLSULFATE 10 MG/10ML IV SOLN
INTRAVENOUS | Status: DC | PRN
  Administered 2016-03-12: 4 mg via INTRAVENOUS

## 2016-03-12 MED ORDER — WARFARIN SODIUM 7.5 MG PO TABS
7.5000 mg | ORAL_TABLET | Freq: Every day | ORAL | Status: DC
  Administered 2016-03-12 – 2016-03-15 (×4): 7.5 mg via ORAL
  Filled 2016-03-12 (×4): qty 1

## 2016-03-12 SURGICAL SUPPLY — 64 items
APL SKNCLS STERI-STRIP NONHPOA (GAUZE/BANDAGES/DRESSINGS)
ATTRACTOMAT 16X20 MAGNETIC DRP (DRAPES) ×3 IMPLANT
BAG DECANTER FOR FLEXI CONT (MISCELLANEOUS) ×3 IMPLANT
BENZOIN TINCTURE PRP APPL 2/3 (GAUZE/BANDAGES/DRESSINGS) IMPLANT
BLADE SURG 10 STRL SS (BLADE) ×1 IMPLANT
BLADE SURG 15 STRL LF DISP TIS (BLADE) IMPLANT
BLADE SURG 15 STRL SS (BLADE)
BNDG GAUZE ELAST 4 BULKY (GAUZE/BANDAGES/DRESSINGS) IMPLANT
CANISTER SUCTION 2500CC (MISCELLANEOUS) ×3 IMPLANT
CATH FOLEY 2WAY SLVR  5CC 16FR (CATHETERS)
CATH FOLEY 2WAY SLVR 5CC 16FR (CATHETERS) IMPLANT
CATH THORACIC 28FR RT ANG (CATHETERS) IMPLANT
CATH THORACIC 36FR (CATHETERS) IMPLANT
CATH THORACIC 36FR RT ANG (CATHETERS) IMPLANT
CLIP TI WIDE RED SMALL 24 (CLIP) IMPLANT
CONN Y 3/8X3/8X3/8  BEN (MISCELLANEOUS)
CONN Y 3/8X3/8X3/8 BEN (MISCELLANEOUS) IMPLANT
CONT SPEC 4OZ CLIKSEAL STRL BL (MISCELLANEOUS) ×2 IMPLANT
COVER SURGICAL LIGHT HANDLE (MISCELLANEOUS) ×6 IMPLANT
DRAPE LAPAROSCOPIC ABDOMINAL (DRAPES) ×1 IMPLANT
DRAPE SLUSH/WARMER DISC (DRAPES) ×2 IMPLANT
DRAPE WARM FLUID 44X44 (DRAPE) IMPLANT
DRSG AQUACEL AG ADV 3.5X14 (GAUZE/BANDAGES/DRESSINGS) ×1 IMPLANT
DRSG PAD ABDOMINAL 8X10 ST (GAUZE/BANDAGES/DRESSINGS) IMPLANT
DRSG VAC ATS SM SENSATRAC (GAUZE/BANDAGES/DRESSINGS) ×2 IMPLANT
ELECT BLADE 4.0 EZ CLEAN MEGAD (MISCELLANEOUS) ×3
ELECT REM PT RETURN 9FT ADLT (ELECTROSURGICAL) ×3
ELECTRODE BLDE 4.0 EZ CLN MEGD (MISCELLANEOUS) IMPLANT
ELECTRODE REM PT RTRN 9FT ADLT (ELECTROSURGICAL) ×1 IMPLANT
GAUZE SPONGE 4X4 12PLY STRL (GAUZE/BANDAGES/DRESSINGS) ×1 IMPLANT
GAUZE XEROFORM 5X9 LF (GAUZE/BANDAGES/DRESSINGS) IMPLANT
GLOVE BIO SURGEON STRL SZ7.5 (GLOVE) ×6 IMPLANT
GOWN STRL REUS W/ TWL LRG LVL3 (GOWN DISPOSABLE) ×4 IMPLANT
GOWN STRL REUS W/TWL LRG LVL3 (GOWN DISPOSABLE) ×12
HANDPIECE INTERPULSE COAX TIP (DISPOSABLE)
HEMOSTAT POWDER SURGIFOAM 1G (HEMOSTASIS) IMPLANT
HEMOSTAT SURGICEL 2X14 (HEMOSTASIS) IMPLANT
KIT BASIN OR (CUSTOM PROCEDURE TRAY) ×3 IMPLANT
KIT ROOM TURNOVER OR (KITS) ×3 IMPLANT
KIT SUCTION CATH 14FR (SUCTIONS) IMPLANT
MARKER SKIN DUAL TIP RULER LAB (MISCELLANEOUS) ×2 IMPLANT
NS IRRIG 1000ML POUR BTL (IV SOLUTION) ×3 IMPLANT
PACK CHEST (CUSTOM PROCEDURE TRAY) ×3 IMPLANT
PAD ARMBOARD 7.5X6 YLW CONV (MISCELLANEOUS) ×6 IMPLANT
SET HNDPC FAN SPRY TIP SCT (DISPOSABLE) ×1 IMPLANT
SOLUTION BETADINE 4OZ (MISCELLANEOUS) ×2 IMPLANT
SPONGE LAP 18X18 X RAY DECT (DISPOSABLE) ×1 IMPLANT
STAPLER VISISTAT 35W (STAPLE) IMPLANT
STRAP MONTGOMERY 1.25X11-1/8 (MISCELLANEOUS) IMPLANT
SUT ETHILON 3 0 FSL (SUTURE) IMPLANT
SUT STEEL 6MS V (SUTURE) IMPLANT
SUT STEEL STERNAL CCS#1 18IN (SUTURE) IMPLANT
SUT STEEL SZ 6 DBL 3X14 BALL (SUTURE) IMPLANT
SUT VIC AB 1 CTX 36 (SUTURE)
SUT VIC AB 1 CTX36XBRD ANBCTR (SUTURE) ×2 IMPLANT
SUT VIC AB 2-0 CTX 27 (SUTURE) ×2 IMPLANT
SUT VIC AB 3-0 X1 27 (SUTURE) ×2 IMPLANT
SWAB COLLECTION DEVICE MRSA (MISCELLANEOUS) ×2 IMPLANT
SYR 5ML LL (SYRINGE) IMPLANT
TOWEL OR 17X24 6PK STRL BLUE (TOWEL DISPOSABLE) ×3 IMPLANT
TOWEL OR 17X26 10 PK STRL BLUE (TOWEL DISPOSABLE) ×3 IMPLANT
TRAY FOLEY CATH 16FRSI W/METER (SET/KITS/TRAYS/PACK) IMPLANT
TUBE ANAEROBIC SPECIMEN COL (MISCELLANEOUS) ×2 IMPLANT
WATER STERILE IRR 1000ML POUR (IV SOLUTION) ×3 IMPLANT

## 2016-03-12 NOTE — Consult Note (Addendum)
WOC Nurse wound consult note Reason for Consult:Discussed plan of care with Dr Morton Peters; consult requested for Vac dressing change on Monday which was applied today in the OR.  Current dressing is intact with good seal at 22mm cont suction, scant amt pink drainage in the cannister. Wound type: Full thickness post-op dressing over LVAD site. Will plan to change the Vac dressing on Monday with the LVAD team present for first post-op dressing change assessment. Cammie Mcgee MSN, RN, CWOCN, [place], CNS [phone]

## 2016-03-12 NOTE — Progress Notes (Signed)
Advanced Heart Failure VAD Team Note  Subjective:    Admitted 03/09/16 for heparin bridge for Driveline debridement scheduled for 03/12/16.   Underwent debridement of driveline wound today.Has wound vac in place. Doing well post-op.    LVAD INTERROGATION:  HeartMate II LVAD:  Flow 4.5 liters/min, speed 9400, power 5.5, PI 7.1.   Objective:    Vital Signs:   Temp:  [96.8 F (36 C)-97.9 F (36.6 C)] 96.8 F (36 C) (12/08 0915) Pulse Rate:  [67-115] 115 (12/08 1200) Resp:  [13-23] 17 (12/08 1200) BP: (95-126)/(81-106) 122/93 (12/08 1200) SpO2:  [83 %-100 %] 83 % (12/08 1200) Weight:  [111.9 kg (246 lb 12.8 oz)] 111.9 kg (246 lb 12.8 oz) (12/08 0440) Last BM Date: 03/11/16   Mean arterial Pressure 80s  Intake/Output:   Intake/Output Summary (Last 24 hours) at 03/12/16 1540 Last data filed at 03/12/16 1257  Gross per 24 hour  Intake           1588.5 ml  Output              880 ml  Net            708.5 ml     Physical Exam: GENERAL: WDWN, NAD.  HEENT: Normal NECK: Supple, JVP ~7; 2+ bilaterally, no bruits. No thyromegaly or nodule noted.  CARDIAC: Mechanical heart sounds with LVAD hum present.  LUNGS: Clear, normal effort. ABDOMEN: Soft, round, NT, ND, no HSM. No bruits or masses. +BS  LVAD exit site: +wound vac in place EXTREMITIES: Warm and dry, no cyanosis, clubbing, rash. No peripheral edema. NEUROLOGIC: Alert and oriented x 4. Gait steady. No aphasia. No dysarthria.Affect pleasant.   Telemetry: NSR  Labs: Basic Metabolic Panel:  Recent Labs Lab 03/10/16 0432  NA 138  K 3.6  CL 105  CO2 27  GLUCOSE 88  BUN 9  CREATININE 1.00  CALCIUM 8.5*    Liver Function Tests: No results for input(s): AST, ALT, ALKPHOS, BILITOT, PROT, ALBUMIN in the last 168 hours. No results for input(s): LIPASE, AMYLASE in the last 168 hours. No results for input(s): AMMONIA in the last 168 hours.  CBC:  Recent Labs Lab 03/10/16 0432 03/11/16 0323  03/12/16 0213  WBC 6.4 7.9 7.5  HGB 13.1 14.2 13.4  HCT 39.1 42.6 40.2  MCV 85.0 84.7 85.4  PLT 146* 180 161    INR:  Recent Labs Lab 03/09/16 1811 03/10/16 0432 03/11/16 0323 03/12/16 0213  INR 1.80 1.63 1.30 1.22    Other results:    Imaging: Dg Chest 2 View  Result Date: 03/12/2016 CLINICAL DATA:  Preop EXAM: CHEST  2 VIEW COMPARISON:  10/28/2015 FINDINGS: Left AICD and LVAD remain in place, unchanged. Cardiomegaly. Lungs are clear. No effusions or acute bony abnormality. IMPRESSION: No acute cardiopulmonary disease. Electronically Signed   By: Charlett Nose M.D.   On: 03/12/2016 08:06     Medications:     Scheduled Medications: . acetaminophen  1,000 mg Oral Q6H   Or  . acetaminophen (TYLENOL) oral liquid 160 mg/5 mL  1,000 mg Oral Q6H  . allopurinol  300 mg Oral Daily  . aspirin EC  325 mg Oral Daily  . bisacodyl  10 mg Oral Daily  . ceFEPime (MAXIPIME) IV  1 g Intravenous Q8H  . hydrALAZINE  25 mg Oral BID  . Influenza vac split quadrivalent PF  0.5 mL Intramuscular Tomorrow-1000  . mexiletine  150 mg Oral BID  . multivitamin with minerals  1 tablet  Oral Daily  . pantoprazole  40 mg Oral Daily  . pneumococcal 23 valent vaccine  0.5 mL Intramuscular Tomorrow-1000  . sacubitril-valsartan  1 tablet Oral BID  . senna-docusate  1 tablet Oral QHS  . warfarin  7.5 mg Oral q1800  . Warfarin - Physician Dosing Inpatient   Does not apply q1800    Infusions: . dextrose 5 % and 0.45% NaCl    . heparin 1,250 Units/hr (03/12/16 1442)    PRN Medications: fentaNYL (SUBLIMAZE) injection, ondansetron (ZOFRAN) IV, oxyCODONE, traMADol   Assessment/Plan    1) Driveline infection, recurrent  - S/p wound debridement and I&D 12/8. Wound vac in place - Bcx negative. Following with ID - Wound culture 02/25/16 with Serratia again. Not sensitive to Cipro. Not currently on po ABX.  - Discussed with [facility] regarding possible Ia time due to recurrent Driveline infection but  in order to prioritize would require culture data and evidence of deep infection (need for debridement, etc).  2) Chronic systolic HF, s/p HMII LVAD implant 05/2012.  - NYHA I-II. Stable at this time. - [facility] Recent RHC looks great.  - Intolerant to even low dose BB. - Continue current meds. Can use lasix as needed.  - Recent Driveline infection (see below) 3) HTN-  - Hydralazine increased and amlodipine decreased, Can continue to titrate hydralazine as tolerated.  4) NSVT/VT - Off amio. On mexilitene. - ICD now at EOL. 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  6) Anticoagulation with coumadin - ASA 325 mg for LVAD. - Continue heparin. Will need to bring INR back up once stable after surgery.  7) LVAD - Stable on 9400. Continue transplant process. Parameters s I reviewed the LVAD parameters from today, and compared the results to the patient's prior recorded data.  No programming changes were made.  The LVAD is functioning within specified parameters.  The patient performs LVAD self-test daily.  LVAD interrogation was negative for any significant power changes, alarms or PI events/speed drops.  LVAD equipment check completed and is in good working order.  Back-up equipment present.   LVAD education done on emergency procedures and precautions and reviewed exit site care. 9) Hyperthyroidism - Amio induced. [place]. Now off amio.  - He is now off methimazole.   Length of Stay: 3  Arvilla MeresBensimhon, Daniel, MD 03/12/2016, 3:40 PM  VAD Team --- VAD ISSUES ONLY--- Pager [phone] (7am - 7am)  Advanced Heart Failure Team Pager [phone] (M-F; 7a - 4p)  Please contact [facility] for night-coverage after hours (4p -7a ) and weekends on [email or web address]

## 2016-03-12 NOTE — Anesthesia Procedure Notes (Signed)
Procedure Name: Intubation Date/Time: 03/12/2016 7:40 AM Performed by: Lanell Matar Pre-anesthesia Checklist: Patient identified, Emergency Drugs available, Suction available and Patient being monitored Patient Re-evaluated:Patient Re-evaluated prior to inductionOxygen Delivery Method: Circle System Utilized Preoxygenation: Pre-oxygenation with 100% oxygen Intubation Type: IV induction Ventilation: Mask ventilation without difficulty Laryngoscope Size: Miller and 2 Grade View: Grade I Tube type: Oral Tube size: 7.5 mm Number of attempts: 1 Airway Equipment and Method: Stylet and Oral airway Placement Confirmation: ETT inserted through vocal cords under direct vision,  positive ETCO2 and breath sounds checked- equal and bilateral Secured at: 23 cm Tube secured with: Tape Dental Injury: Teeth and Oropharynx as per pre-operative assessment

## 2016-03-12 NOTE — Progress Notes (Signed)
ANTICOAGULATION CONSULT NOTE - Follow Up Consult  Pharmacy Consult for heparin Indication: LVAD  Allergies  Allergen Reactions  . Metoprolol Other (See Comments)    Passes out  . Imdur [Isosorbide Nitrate] Other (See Comments)    HEADACHE--REACTION IS SIDE EFFECT OF NITRATES    Patient Measurements: Height: 6\' 2"  (188 cm) Weight: 246 lb 12.8 oz (111.9 kg) IBW/kg (Calculated) : 82.2 Heparin Dosing Weight: 106  Vital Signs: Temp: 97.8 F (36.6 C) (12/08 1900) Temp Source: Oral (12/08 1900) BP: 113/93 (12/08 2000) Pulse Rate: 65 (12/08 2000)  Labs:  Recent Labs  03/10/16 0432 03/11/16 0323 03/11/16 1056 03/12/16 0213 03/12/16 2134  HGB 13.1 14.2  --  13.4  --   HCT 39.1 42.6  --  40.2  --   PLT 146* 180  --  161  --   APTT  --   --   --  140*  --   LABPROT 19.6* 16.3*  --  15.5*  --   INR 1.63 1.30  --  1.22  --   HEPARINUNFRC 0.48 0.85* 0.62  --  0.30  CREATININE 1.00  --   --   --   --     Estimated Creatinine Clearance: 104.6 mL/min (by C-G formula based on SCr of 1 mg/dL).   Medical History: Past Medical History:  Diagnosis Date  . AICD (automatic cardioverter/defibrillator) present 2011  . Bronchitis   . Congestive heart failure (HCC) 2011  . Hypertension 2000  . LVAD (left ventricular assist device) present (HCC) 2014  . Pneumonia   . 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  s/p HM II LVAD implant 05/2012  Patient is on chronic coumadin for LVAD, he is being admitted for driveline debridement. His warfarin is on hold for procedure and he was started on heparin drip for  INR < 1.8.   S/p I&D today.  Heparin to resume at 3 pm per Dr. Donata Clay.  Coumadin resumes tonight.  Heparin level 0.3 on 1250 units/hr, no bleeding documented per chart.  Goal of Therapy:  HL goal 0.2-0.3 for first 24  hrs (until 12/9 at 3 pm), then can increase to 0.3-0.7 Heparin level 0.3-0.7 units/ml Monitor platelets by anticoagulation protocol: Yes   Plan:  Continue heparin at 1250 units/hr. Daily heparin level and CBC.  Bayard Hugger, PharmD, BCPS  Clinical Pharmacist  Pager: [phone]    03/12/2016 10:13 PM

## 2016-03-12 NOTE — Transfer of Care (Signed)
Immediate Anesthesia Transfer of Care Note  Patient: Robert [name]  Procedure(s) Performed: Procedure(s): WOUND DEBRIDEMENT AT LVAD SITE (N/A) APPLICATION OF WOUND VAC (N/A)  Patient Location: PACU  Anesthesia Type:General  Level of Consciousness: awake, alert  and oriented  Airway & Oxygen Therapy: Patient Spontanous Breathing and Patient connected to nasal cannula oxygen  Post-op Assessment: Report given to RN, Post -op Vital signs reviewed and stable and Patient moving all extremities X 4  Post vital signs: Reviewed and stable  Last Vitals:  Vitals:   03/12/16 0440 03/12/16 0856  BP: (!) 113/101   Pulse: 67   Resp: 18   Temp: 36.6 C (!) 36 C    Last Pain:  Vitals:   03/12/16 0440  TempSrc: Oral  PainSc:          Complications: No apparent anesthesia complications

## 2016-03-12 NOTE — Anesthesia Preprocedure Evaluation (Addendum)
Anesthesia Evaluation  Patient identified by MRN, date of birth, ID band Patient awake    Reviewed: Allergy & Precautions, NPO status , Patient's Chart, lab work & pertinent test results  Airway Mallampati: II  TM Distance: >3 FB Neck ROM: Full    Dental  (+) Teeth Intact, Dental Advisory Given, Missing   Pulmonary neg pulmonary ROS,    Pulmonary exam normal breath sounds clear to auscultation       Cardiovascular hypertension, +CHF  + Cardiac Defibrillator  Rhythm:Regular Rate:Normal  status post implantation of HeartMate 2 for nonischemic cardiomyopathy  LVAD INTERROGATION:  HeartMate II LVAD:  Flow 437 liters/min, speed 9400, power 5.3, PI 7.6.  Echo 10/2015: Study Conclusions  - Left ventricle: The cavity size was moderately dilated. Apical LVAD (left ventricular assist device) in place. Systolic function was severely reduced. The estimated ejection fraction was in the range of 15% to 20%. Diffuse hypokinesis. - Ventricular septum: Septal motion showed abnormal function and dyssynergy. - Aortic valve: Period opening of the aortic valve. There was trivial regurgitation. - Aorta: Aortic root dimension: 39 mm (ED). - Ascending aorta: The ascending aorta was mildly dilated. - Right ventricle: The cavity size was moderately dilated. Wall thickness was normal. Systolic function was mildly reduced.   Neuro/Psych negative neurological ROS  negative psych ROS   GI/Hepatic Neg liver ROS, GERD  Medicated,  Endo/Other  Hyperthyroidism Obesity   Renal/GU negative Renal ROS     Musculoskeletal negative musculoskeletal ROS (+)   Abdominal   Peds  Hematology negative hematology ROS (+)   Anesthesia Other Findings Day of surgery medications reviewed with the patient.  Reproductive/Obstetrics                            Anesthesia Physical Anesthesia Plan  ASA: IV  Anesthesia Plan: General    Post-op Pain Management:    Induction: Intravenous  Airway Management Planned: Oral ETT  Additional Equipment:   Intra-op Plan:   Post-operative Plan: Extubation in OR  Informed Consent: I have reviewed the patients History and Physical, chart, labs and discussed the procedure including the risks, benefits and alternatives for the proposed anesthesia with the patient or authorized representative who has indicated his/her understanding and acceptance.   Dental advisory given  Plan Discussed with: CRNA  Anesthesia Plan Comments: (Risks/benefits of general anesthesia discussed with patient including risk of damage to teeth, lips, gum, and tongue, nausea/vomiting, allergic reactions to medications, and the possibility of heart attack, stroke and death.  All patient questions answered.  Patient wishes to proceed.)        Anesthesia Quick Evaluation

## 2016-03-12 NOTE — Anesthesia Postprocedure Evaluation (Signed)
Anesthesia Post Note  Patient: Robert [name]  Procedure(s) Performed: Procedure(s) (LRB): WOUND DEBRIDEMENT AT LVAD SITE (N/A) APPLICATION OF WOUND VAC (N/A)  Patient location during evaluation: PACU Anesthesia Type: General Level of consciousness: awake and alert Pain management: pain level controlled Vital Signs Assessment: post-procedure vital signs reviewed and stable Respiratory status: spontaneous breathing, nonlabored ventilation, respiratory function stable and patient connected to nasal cannula oxygen Cardiovascular status: blood pressure returned to baseline and stable Postop Assessment: no signs of nausea or vomiting Anesthetic complications: no    Last Vitals:  Vitals:   03/12/16 1200 03/12/16 1604  BP: (!) 122/93 103/85  Pulse: (!) 115 62  Resp: 17 (!) 24  Temp:  36.4 C    Last Pain:  Vitals:   03/12/16 1604  TempSrc: Oral  PainSc:                  Cecile Hearing

## 2016-03-12 NOTE — Progress Notes (Signed)
The patient was examined and preop studies reviewed. There has been no change from the prior exam and the patient is ready for surgery.  plan wound debridement and wound VAC on C [place]

## 2016-03-12 NOTE — Progress Notes (Signed)
ANTICOAGULATION CONSULT NOTE - Follow Up Consult  Pharmacy Consult for heparin Indication: LVAD  Allergies  Allergen Reactions  . Metoprolol Other (See Comments)    Passes out  . Imdur [Isosorbide Nitrate] Other (See Comments)    HEADACHE--REACTION IS SIDE EFFECT OF NITRATES    Patient Measurements: Height: 6\' 2"  (188 cm) Weight: 246 lb 12.8 oz (111.9 kg) IBW/kg (Calculated) : 82.2 Heparin Dosing Weight: 106  Vital Signs: Temp: 96.8 F (36 C) (12/08 0915) Temp Source: Oral (12/08 0440) BP: 122/93 (12/08 1200) Pulse Rate: 115 (12/08 1200)  Labs:  Recent Labs  03/10/16 0432 03/11/16 0323 03/11/16 1056 03/12/16 0213  HGB 13.1 14.2  --  13.4  HCT 39.1 42.6  --  40.2  PLT 146* 180  --  161  APTT  --   --   --  140*  LABPROT 19.6* 16.3*  --  15.5*  INR 1.63 1.30  --  1.22  HEPARINUNFRC 0.48 0.85* 0.62  --   CREATININE 1.00  --   --   --     Estimated Creatinine Clearance: 104.6 mL/min (by C-G formula based on SCr of 1 mg/dL).   Medical History: Past Medical History:  Diagnosis Date  . AICD (automatic cardioverter/defibrillator) present 2011  . Bronchitis   . Congestive heart failure (HCC) 2011  . Hypertension 2000  . LVAD (left ventricular assist device) present (HCC) 2014  . Pneumonia   . 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  s/p HM II LVAD implant 05/2012  Patient is on chronic coumadin for LVAD, he is being admitted for driveline debridement. His warfarin is on hold for procedure and he was started on heparin drip for  INR < 1.8.   S/p I&D today.  Heparin to resume at 3 pm per Dr. Donata Clay.  Coumadin resumes tonight.   Goal of Therapy:  Heparin level 0.3-0.7 units/ml Monitor platelets by anticoagulation protocol: Yes   Plan:  Resume IV heparin at 1250 units/hr. Heparin level in 6  hrs. Daily heparin level and CBC. Warfarin 7.5 mg x 1 tonight per MD, pharmacy to begin dosing 12/9.  Tad Moore, BCPS  Clinical Pharmacist Pager [phone]  03/12/2016 2:13 PM

## 2016-03-12 NOTE — Progress Notes (Addendum)
Patient taken to OR for LVAD driveline infection debridement and wound vac placement.  I met the patient in his room, his bedside RN had the patient ready for the OR, patient was placed on his batteries, all LVAD equipment/cart was taken along with the patient to pre-op and then to the OR.  Pre-Op  LVAD parameters WNL, see VAD flowsheet.  During procedure:  Induction at 737  750: After induction, PI 2.8-3.2, Pump Flow 2.8-3.2 as well, MAP in the mid 70s (doppler), patient given about 150cc LR, albumin 250, NEO 75mg, and NEO infusion started at 274m to help with post-induction vasodilation.   825, patient was weaned off NEO, PI and Pump flows improved back to patient's baseline.  848 Extubated in OR. MAP 110, patient mildly agitation upon awakening, once calm, MAP in the low 90s.  HR normal during case and in recovery. Patient taken to PACU at 900, patient returned to room at 920.  VAD equipment taken back to patient's room, patient was awake, talking, and following commands, no complaints of pain, patient was ready to have something to drink.  MD/NP were updated, MD (Bensimhon, saw patient in PACU). Handoff given to primary RN

## 2016-03-12 NOTE — Brief Op Note (Signed)
03/09/2016 - 03/12/2016  8:49 AM  PATIENT:  Robert [name]  60 y.o. male  PRE-OPERATIVE DIAGNOSIS:  LVAD DRIVELINE INFECTION  POST-OPERATIVE DIAGNOSIS:  LVAD DRIVELINE INFECTION                                                          Deep Wound Infection PROCEDURE:  Procedure(s): WOUND DEBRIDEMENT AT LVAD SITE (N/A) APPLICATION OF WOUND VAC (N/A)   excisional debridement and irrigation  SURGEON:  Surgeon(s) and Role:    * Kerin Perna, MD - Primary  PHYSICIAN ASSISTANT: NA  ASSISTANTS: none   ANESTHESIA:   general  EBL:  Total I/O In: 250 [IV Piggyback:250] Out: -   BLOOD ADMINISTERED:none  DRAINS: none   LOCAL MEDICATIONS USED:  NONE  SPECIMEN:  Excision  DISPOSITION OF SPECIMEN:  microbiology  COUNTS:  YES  TOURNIQUET:  * No tourniquets in log *  DICTATION: .Dragon Dictation  PLAN OF CARE: return to stepdown 4N  PATIENT DISPOSITION:  PACU - hemodynamically stable.   Delay start of Pharmacological VTE agent (>24hrs) due to surgical blood loss or risk of bleeding: yes

## 2016-03-13 ENCOUNTER — Encounter (HOSPITAL_COMMUNITY): Payer: Self-pay | Admitting: *Deleted

## 2016-03-13 LAB — HEPARIN LEVEL (UNFRACTIONATED)
HEPARIN UNFRACTIONATED: 0.25 [IU]/mL — AB (ref 0.30–0.70)
HEPARIN UNFRACTIONATED: 0.2 [IU]/mL — AB (ref 0.30–0.70)

## 2016-03-13 LAB — CBC
HCT: 40 % (ref 39.0–52.0)
Hemoglobin: 13.3 g/dL (ref 13.0–17.0)
MCH: 28.3 pg (ref 26.0–34.0)
MCHC: 33.3 g/dL (ref 30.0–36.0)
MCV: 85.1 fL (ref 78.0–100.0)
Platelets: 148 10*3/uL — ABNORMAL LOW (ref 150–400)
RBC: 4.7 MIL/uL (ref 4.22–5.81)
RDW: 15.4 % (ref 11.5–15.5)
WBC: 8.4 10*3/uL (ref 4.0–10.5)

## 2016-03-13 LAB — BASIC METABOLIC PANEL
ANION GAP: 7 (ref 5–15)
BUN: 9 mg/dL (ref 6–20)
CALCIUM: 8.6 mg/dL — AB (ref 8.9–10.3)
CO2: 26 mmol/L (ref 22–32)
Chloride: 101 mmol/L (ref 101–111)
Creatinine, Ser: 1.13 mg/dL (ref 0.61–1.24)
GLUCOSE: 112 mg/dL — AB (ref 65–99)
POTASSIUM: 3.7 mmol/L (ref 3.5–5.1)
SODIUM: 134 mmol/L — AB (ref 135–145)

## 2016-03-13 LAB — LACTATE DEHYDROGENASE: LDH: 179 U/L (ref 98–192)

## 2016-03-13 LAB — PROTIME-INR
INR: 1.22
Prothrombin Time: 15.4 seconds — ABNORMAL HIGH (ref 11.4–15.2)

## 2016-03-13 MED ORDER — WARFARIN SODIUM 7.5 MG PO TABS: 7.5000 mg | ORAL_TABLET | Freq: Once | ORAL | Status: DC

## 2016-03-13 MED ORDER — HEPARIN (PORCINE) IN NACL 100-0.45 UNIT/ML-% IJ SOLN
1350.0000 [IU]/h | INTRAMUSCULAR | Status: DC
  Administered 2016-03-13 – 2016-03-14 (×2): 1250 [IU]/h via INTRAVENOUS
  Filled 2016-03-13: qty 250

## 2016-03-13 MED ORDER — HEPARIN (PORCINE) IN NACL 100-0.45 UNIT/ML-% IJ SOLN: 1250.0000 [IU]/h | INTRAMUSCULAR | Status: DC

## 2016-03-13 NOTE — Progress Notes (Signed)
Advanced Heart Failure VAD Team Note  Subjective:    Admitted 03/09/16 for heparin bridge for Driveline debridement   Underwent debridement of driveline wound with placement of wound vac12/8.   Throat feels funny. Tugged on wound vac accidentally this am and has some bleeding at site. Otherwise no complaints. MAPs 80s    LVAD INTERROGATION:  HeartMate II LVAD:  Flow 5.5 liters/min, speed 9400, power 5.5, PI 6.1 No PI events  Objective:    Vital Signs:   Temp:  [97.5 F (36.4 C)-98.3 F (36.8 C)] 98.1 F (36.7 C) (12/09 1155) Pulse Rate:  [62-72] 69 (12/09 0400) Resp:  [14-24] 19 (12/09 1155) BP: (94-117)/(68-94) 117/68 (12/09 1155) SpO2:  [97 %-100 %] 100 % (12/09 1155) Weight:  [112.3 kg (247 lb 9.6 oz)] 112.3 kg (247 lb 9.6 oz) (12/09 0308) Last BM Date: 03/11/16   Mean arterial Pressure 80s  Intake/Output:   Intake/Output Summary (Last 24 hours) at 03/13/16 1214 Last data filed at 03/13/16 1100  Gross per 24 hour  Intake          1143.75 ml  Output             3150 ml  Net         -2006.25 ml     Physical Exam: GENERAL: WDWN, NAD.  HEENT: Normal  uvula ecchymotic and swollen NECK: Supple, JVP ~7; 2+ bilaterally, no bruits. No thyromegaly or nodule noted.  CARDIAC: Mechanical heart sounds with LVAD hum present.  LUNGS: Clear, normal effort. ABDOMEN: Soft, round, NT, ND, no HSM. No bruits or masses. +BS  LVAD exit site: +wound vac in place with bleeding around site EXTREMITIES: Warm and dry, no cyanosis, clubbing, rash. No peripheral edema. NEUROLOGIC: Alert and oriented x 4. Gait steady. No aphasia. No dysarthria.Affect pleasant.   Telemetry: NSR  Labs: Basic Metabolic Panel:  Recent Labs Lab 03/10/16 0432 03/13/16 0135  NA 138 134*  K 3.6 3.7  CL 105 101  CO2 27 26  GLUCOSE 88 112*  BUN 9 9  CREATININE 1.00 1.13  CALCIUM 8.5* 8.6*    Liver Function Tests: No results for input(s): AST, ALT, ALKPHOS, BILITOT, PROT, ALBUMIN in the  last 168 hours. No results for input(s): LIPASE, AMYLASE in the last 168 hours. No results for input(s): AMMONIA in the last 168 hours.  CBC:  Recent Labs Lab 03/10/16 0432 03/11/16 0323 03/12/16 0213 03/13/16 0135  WBC 6.4 7.9 7.5 8.4  HGB 13.1 14.2 13.4 13.3  HCT 39.1 42.6 40.2 40.0  MCV 85.0 84.7 85.4 85.1  PLT 146* 180 161 148*    INR:  Recent Labs Lab 03/09/16 1811 03/10/16 0432 03/11/16 0323 03/12/16 0213 03/13/16 0135  INR 1.80 1.63 1.30 1.22 1.22    Other results:    Imaging: Dg Chest 2 View  Result Date: 03/12/2016 CLINICAL DATA:  Preop EXAM: CHEST  2 VIEW COMPARISON:  10/28/2015 FINDINGS: Left AICD and LVAD remain in place, unchanged. Cardiomegaly. Lungs are clear. No effusions or acute bony abnormality. IMPRESSION: No acute cardiopulmonary disease. Electronically Signed   By: Charlett Nose M.D.   On: 03/12/2016 08:06     Medications:     Scheduled Medications: . acetaminophen  1,000 mg Oral Q6H   Or  . acetaminophen (TYLENOL) oral liquid 160 mg/5 mL  1,000 mg Oral Q6H  . allopurinol  300 mg Oral Daily  . aspirin EC  325 mg Oral Daily  . bisacodyl  10 mg Oral Daily  . ceFEPime (  MAXIPIME) IV  1 g Intravenous Q8H  . hydrALAZINE  25 mg Oral BID  . Influenza vac split quadrivalent PF  0.5 mL Intramuscular Tomorrow-1000  . mexiletine  150 mg Oral BID  . multivitamin with minerals  1 tablet Oral Daily  . pantoprazole  40 mg Oral Daily  . pneumococcal 23 valent vaccine  0.5 mL Intramuscular Tomorrow-1000  . sacubitril-valsartan  1 tablet Oral BID  . senna-docusate  1 tablet Oral QHS  . warfarin  7.5 mg Oral q1800  . Warfarin - Physician Dosing Inpatient   Does not apply q1800    Infusions: . dextrose 5 % and 0.45% NaCl    . heparin 1,250 Units/hr (03/13/16 0601)    PRN Medications: fentaNYL (SUBLIMAZE) injection, ondansetron (ZOFRAN) IV, oxyCODONE, traMADol   Assessment/Plan    1) Driveline infection, recurrent  - S/p wound debridement  and I&D 12/8. Wound vac in place - has some bleeding at site today from trauma. Will hold heparin. I spoke with Dr. Donata ClayVan Trigt who will see him soon. Will apply pressure dressing.  - Bcx negative. Following with ID - Wound culture 02/25/16 with Serratia again. Not sensitive to Cipro. Not currently on po ABX.  - Discussed with [facility] regarding possible Ia time due to recurrent Driveline infection but in order to prioritize would require culture data and evidence of deep infection (need for debridement, etc).  2) Chronic systolic HF, s/p HMII LVAD implant 05/2012.  - NYHA I-II. Stable at this time. - [facility] Recent RHC looks great.  - Intolerant to even low dose BB. - Continue current meds. Can use lasix as needed.  - Recent Driveline infection (see below) 3) Sore throat - Has some trauma from ETT. Should resolve soon.  4) HTN-  - Hydralazine increased and amlodipine decreased. MAPs look good  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 - ICD now at EOL. We have decided to not replace given that risk of infection felt to be higher than benefit or replacing as VT usually well tolerated in setting of VAD support 6) PVCs - Much improved with treatment of hyperthyroidism.  7) Anticoagulation with coumadin - ASA 325 mg for LVAD. - Continue heparin. Will need to bring INR back up once stable after surgery.  8) LVAD - Stable on 9400. Continue transplant process. Parameters s I reviewed the LVAD parameters from today, and compared the results to the patient's prior recorded data.  No programming changes were made.  The LVAD is functioning within specified parameters.  The patient performs LVAD self-test daily.  LVAD interrogation was negative for any significant power changes, alarms or PI events/speed drops.  LVAD equipment check completed and is in good working order.  Back-up equipment present.   LVAD education done on emergency procedures and precautions and reviewed exit site  care. 9) Hyperthyroidism - Amio induced. Follows with Dr. [place]lyn [place]. Now off amio.  - He is now off methimazole.   Length of Stay: 4  Arvilla MeresBensimhon, Morrisa Aldaba, MD 03/13/2016, 12:14 PM  VAD Team --- VAD ISSUES ONLY--- Pager [phone] (7am - 7am)  Advanced Heart Failure Team Pager [phone] (M-F; 7a - 4p)  Please contact [facility] for night-coverage after hours (4p -7a ) and weekends on [email or web address]

## 2016-03-13 NOTE — Progress Notes (Signed)
1 Day Post-Op Procedure(s) (LRB): WOUND DEBRIDEMENT AT LVAD SITE (N/A) APPLICATION OF WOUND VAC (N/A) Subjective: Bleeding from abdominal wound- VAC removed and packed with sterile 4x4 wet to dry Heparin needs to be at low dose while coumadin resumed  Objective: Vital signs in last 24 hours: Temp:  [97.5 F (36.4 C)-98.3 F (36.8 C)] 98.1 F (36.7 C) (12/09 1155) Pulse Rate:  [62-72] 69 (12/09 0400) Cardiac Rhythm: Normal sinus rhythm (12/09 0800) Resp:  [14-24] 19 (12/09 1155) BP: (94-117)/(68-94) 117/68 (12/09 1155) SpO2:  [97 %-100 %] 100 % (12/09 1155) Weight:  [247 lb 9.6 oz (112.3 kg)] 247 lb 9.6 oz (112.3 kg) (12/09 0308)  Hemodynamic parameters for last 24 hours:   stable Intake/Output from previous day: 12/08 0701 - 12/09 0700 In: 1991.5 [P.O.:500; I.V.:1091.5; IV Piggyback:400] Out: 3155 [Urine:3150; Blood:5] Intake/Output this shift: Total I/O In: 302.3 [P.O.:240; I.V.:62.3] Out: -        Exam    General- alert and comfortable   Lungs- clear without rales, wheezes   Cor- regular rate and rhythm, no murmur , gallop   Abdomen- soft, non-tender   Extremities - warm, non-tender, minimal edema   Neuro- oriented, appropriate, no focal weakness   Lab Results:  Recent Labs  03/12/16 0213 03/13/16 0135  WBC 7.5 8.4  HGB 13.4 13.3  HCT 40.2 40.0  PLT 161 148*   BMET:  Recent Labs  03/13/16 0135  NA 134*  K 3.7  CL 101  CO2 26  GLUCOSE 112*  BUN 9  CREATININE 1.13  CALCIUM 8.6*    PT/INR:  Recent Labs  03/13/16 0135  LABPROT 15.4*  INR 1.22   ABG    Component Value Date/Time   PHART 7.404 03/12/2016 0326   HCO3 24.9 03/12/2016 0326   TCO2 26 11/06/2015 0825   ACIDBASEDEF 2.0 06/10/2014 1337   O2SAT 95.8 03/12/2016 0326   CBG (last 3)  No results for input(s): GLUCAP in the last 72 hours.  Assessment/Plan: S/P Procedure(s) (LRB): WOUND DEBRIDEMENT AT LVAD SITE (N/A) APPLICATION OF WOUND VAC (N/A) Daily dressing changes for  now Try to resume VAC after heparin off   LOS: 4 days    Kathlee Nations Trigt III 03/13/2016

## 2016-03-13 NOTE — Progress Notes (Addendum)
ANTICOAGULATION CONSULT NOTE - Follow Up Consult  Pharmacy Consult for heparin Indication: LVAD  Allergies  Allergen Reactions  . Metoprolol Other (See Comments)    Passes out  . Imdur [Isosorbide Nitrate] Other (See Comments)    HEADACHE--REACTION IS SIDE EFFECT OF NITRATES    Patient Measurements: Height: 6\' 2"  (188 cm) Weight: 247 lb 9.6 oz (112.3 kg) IBW/kg (Calculated) : 82.2 Heparin Dosing Weight: 106  Vital Signs: Temp: 98.3 F (36.8 C) (12/09 0734) Temp Source: Oral (12/09 0734) BP: 105/88 (12/09 0734) Pulse Rate: 69 (12/09 0400)  Labs:  Recent Labs  03/11/16 0323 03/11/16 1056 03/12/16 0213 03/12/16 2134 03/13/16 0135 03/13/16 0804  HGB 14.2  --  13.4  --  13.3  --   HCT 42.6  --  40.2  --  40.0  --   PLT 180  --  161  --  148*  --   APTT  --   --  140*  --   --   --   LABPROT 16.3*  --  15.5*  --  15.4*  --   INR 1.30  --  1.22  --  1.22  --   HEPARINUNFRC 0.85* 0.62  --  0.30  --  0.25*  CREATININE  --   --   --   --  1.13  --     Estimated Creatinine Clearance: 92.6 mL/min (by C-G formula based on SCr of 1.13 mg/dL).   Medical History: Past Medical History:  Diagnosis Date  . AICD (automatic cardioverter/defibrillator) present 2011  . Bronchitis   . Congestive heart failure (HCC) 2011  . Hypertension 2000  . LVAD (left ventricular assist device) present (HCC) 2014  . Pneumonia   . 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 s/p HM II LVAD implant 05/2012. On chronic warfarin for LVAD, admitted for drive line debridement. Warfarin as been on hold and pt is on heparin.  PTA warfarin 7.5 mg daily  S/p I&D and heparin was resumed. Per MD continue heparin at low range (heparin level 0.2 - 0.3) until 1500 today and restart warfarin today. Heparin level 0.25. INR 1.22. CBC stable. No issues  noted.   Goal of Therapy:  HL goal 0.2-0.3 (until 12/9 at 3 pm), then can increase to 0.3-0.7 Heparin level 0.3-0.7 units/ml Monitor platelets by anticoagulation protocol: Yes   Plan:  Continue heparin at 1250 units/hr. Warfarin 7.5 mg daily per MD Heparin level today at 1500 when goal level increases to 0.3 - 0.7 Daily HL, INR, CBC Monitor for S&S of bleed  Sandi Carne, PharmD, BCPS Pharmacy Resident Pager: [phone]  03/13/2016 8:38 AM  ___________________________________________________________________ ADDENDUM:  After pt ambulated in room, RN noticed blood at drive line and new oozing from site of drive line. Heparin was stopped. Per MD will resume at 1330 today. Due to this bleed will shoot for lower heparin level goal of 0.2 - 0.3 for additional 24hrs  Plan: Continue heparin at 1250 units/hr starting 12/9 at 1330 6hr heparin level (goal level 0.2 - 0.3 for 24 hours) Daily heparin level, CBC Monitor for additional bleeding  Sandi Carne, PharmD, BCPS Pharmacy Resident Pager: [phone]

## 2016-03-13 NOTE — Progress Notes (Signed)
ANTICOAGULATION CONSULT NOTE  Pharmacy Consult for heparin Indication: LVAD  Allergies  Allergen Reactions  . Metoprolol Other (See Comments)    Passes out  . Imdur [Isosorbide Nitrate] Other (See Comments)    HEADACHE--REACTION IS SIDE EFFECT OF NITRATES    Patient Measurements: Height: 6\' 2"  (188 cm) Weight: 247 lb 9.6 oz (112.3 kg) IBW/kg (Calculated) : 82.2 Heparin Dosing Weight: 106  Vital Signs: Temp: 97.7 F (36.5 C) (12/09 1923) Temp Source: Oral (12/09 1923) BP: 107/84 (12/09 2100) Pulse Rate: 70 (12/09 1923)  Labs:  Recent Labs  03/11/16 0323  03/12/16 0213 03/12/16 2134 03/13/16 0135 03/13/16 0804 03/13/16 2236  HGB 14.2  --  13.4  --  13.3  --   --   HCT 42.6  --  40.2  --  40.0  --   --   PLT 180  --  161  --  148*  --   --   APTT  --   --  140*  --   --   --   --   LABPROT 16.3*  --  15.5*  --  15.4*  --   --   INR 1.30  --  1.22  --  1.22  --   --   HEPARINUNFRC 0.85*  < >  --  0.30  --  0.25* 0.20*  CREATININE  --   --   --   --  1.13  --   --   < > = values in this interval not displayed.  Estimated Creatinine Clearance: 92.6 mL/min (by C-G formula based on SCr of 1.13 mg/dL).  Assessment: 60 y.o. male with LVAD for heparin  Goal of Therapy:  Heparin level  0.2-0.3 Monitor platelets by anticoagulation protocol: Yes   Plan:  Continue Heparin at current rate  Follow-up am labs.  Geannie Risen, PharmD, BCPS

## 2016-03-13 NOTE — Progress Notes (Signed)
Pt oob and ambulating in room. Washed up and called nurse after noticing blood at drive line.  RN to room and discovered new bleeding/oozing at site of drive line. Dressing pulled back half way to clean and additional dressing added. Dr. Clarise Cruz in and aware of situation.  Dr. Donata Clay notified and came to room. Wound Vac removed and site packed with 4x4 sterile dressings. Heparin stopped and will resume at 1330.

## 2016-03-13 NOTE — Discharge Instructions (Addendum)

## 2016-03-14 LAB — COMPREHENSIVE METABOLIC PANEL WITH GFR
ALT: 13 U/L — ABNORMAL LOW (ref 17–63)
AST: 15 U/L (ref 15–41)
Albumin: 3.6 g/dL (ref 3.5–5.0)
Alkaline Phosphatase: 79 U/L (ref 38–126)
Anion gap: 11 (ref 5–15)
BUN: 12 mg/dL (ref 6–20)
CO2: 22 mmol/L (ref 22–32)
Calcium: 8.8 mg/dL — ABNORMAL LOW (ref 8.9–10.3)
Chloride: 102 mmol/L (ref 101–111)
Creatinine, Ser: 1.21 mg/dL (ref 0.61–1.24)
GFR calc Af Amer: >60 mL/min
GFR calc non Af Amer: >60 mL/min
Glucose, Bld: 95 mg/dL (ref 65–99)
Potassium: 3.9 mmol/L (ref 3.5–5.1)
Sodium: 135 mmol/L (ref 135–145)
Total Bilirubin: 0.6 mg/dL (ref 0.3–1.2)
Total Protein: 6.5 g/dL (ref 6.5–8.1)

## 2016-03-14 LAB — CBC
HCT: 40 % (ref 39.0–52.0)
Hemoglobin: 13.1 g/dL (ref 13.0–17.0)
MCH: 28.3 pg (ref 26.0–34.0)
MCHC: 32.8 g/dL (ref 30.0–36.0)
MCV: 86.4 fL (ref 78.0–100.0)
Platelets: 149 10*3/uL — ABNORMAL LOW (ref 150–400)
RBC: 4.63 MIL/uL (ref 4.22–5.81)
RDW: 15.4 % (ref 11.5–15.5)
WBC: 8.9 10*3/uL (ref 4.0–10.5)

## 2016-03-14 LAB — AEROBIC CULTURE W GRAM STAIN (SUPERFICIAL SPECIMEN)

## 2016-03-14 LAB — HEPARIN LEVEL (UNFRACTIONATED): Heparin Unfractionated: 0.17 [IU]/mL — ABNORMAL LOW (ref 0.30–0.70)

## 2016-03-14 LAB — PROTIME-INR
INR: 1.21
Prothrombin Time: 15.4 seconds — ABNORMAL HIGH (ref 11.4–15.2)

## 2016-03-14 LAB — LACTATE DEHYDROGENASE: LDH: 192 U/L (ref 98–192)

## 2016-03-14 MED ORDER — HEPARIN (PORCINE) IN NACL 100-0.45 UNIT/ML-% IJ SOLN
1100.0000 [IU]/h | INTRAMUSCULAR | Status: DC
  Administered 2016-03-14 – 2016-03-15 (×2): 1000 [IU]/h via INTRAVENOUS
  Filled 2016-03-14: qty 250

## 2016-03-14 NOTE — Progress Notes (Signed)
Advanced Heart Failure VAD Team Note  Subjective:    Admitted 03/09/16 for heparin bridge for Driveline debridement   Underwent debridement of driveline wound with placement of wound vac12/8.   Wound vac removed yesterday due to bleeding. Site packed. Heparin restarted. No further bleeding. Heparin restarted. MAPs 80s    LVAD INTERROGATION:  HeartMate II LVAD:  Flow 5.4 liters/min, speed 9400, power 6.0, PI 6.3 No PI events  Objective:    Vital Signs:   Temp:  [97.7 F (36.5 C)-98.3 F (36.8 C)] 97.9 F (36.6 C) (12/10 1142) Pulse Rate:  [60-83] 60 (12/10 0327) Resp:  [16-22] 19 (12/10 1142) BP: (86-110)/(73-93) 101/86 (12/10 1142) SpO2:  [96 %-100 %] 99 % (12/10 1142) Weight:  [111.9 kg (246 lb 9.6 oz)] 111.9 kg (246 lb 9.6 oz) (12/10 0327) Last BM Date: 03/11/16   Mean arterial Pressure 80s  Intake/Output:   Intake/Output Summary (Last 24 hours) at 03/14/16 1210 Last data filed at 03/14/16 0900  Gross per 24 hour  Intake           444.12 ml  Output                0 ml  Net           444.12 ml     Physical Exam: GENERAL: WDWN, NAD.  HEENT: Normal  uvula ecchymotic and swollen NECK: Supple, JVP ~7; 2+ bilaterally, no bruits. No thyromegaly or nodule noted.  CARDIAC: Mechanical heart sounds with LVAD hum present.  LUNGS: Clear, normal effort. ABDOMEN: Soft, round, NT, ND, no HSM. No bruits or masses. +BS  LVAD exit site: +wound vac in place with bleeding around site EXTREMITIES: Warm and dry, no cyanosis, clubbing, rash. No peripheral edema. NEUROLOGIC: Alert and oriented x 4. Gait steady. No aphasia. No dysarthria.Affect pleasant.   Telemetry: NSR  Labs: Basic Metabolic Panel:  Recent Labs Lab 03/10/16 0432 03/13/16 0135 03/14/16 0202  NA 138 134* 135  K 3.6 3.7 3.9  CL 105 101 102  CO2 27 26 22   GLUCOSE 88 112* 95  BUN 9 9 12   CREATININE 1.00 1.13 1.21  CALCIUM 8.5* 8.6* 8.8*    Liver Function Tests:  Recent Labs Lab  03/14/16 0202  AST 15  ALT 13*  ALKPHOS 79  BILITOT 0.6  PROT 6.5  ALBUMIN 3.6   No results for input(s): LIPASE, AMYLASE in the last 168 hours. No results for input(s): AMMONIA in the last 168 hours.  CBC:  Recent Labs Lab 03/10/16 0432 03/11/16 0323 03/12/16 0213 03/13/16 0135 03/14/16 0202  WBC 6.4 7.9 7.5 8.4 8.9  HGB 13.1 14.2 13.4 13.3 13.1  HCT 39.1 42.6 40.2 40.0 40.0  MCV 85.0 84.7 85.4 85.1 86.4  PLT 146* 180 161 148* 149*    INR:  Recent Labs Lab 03/10/16 0432 03/11/16 0323 03/12/16 0213 03/13/16 0135 03/14/16 0202  INR 1.63 1.30 1.22 1.22 1.21    Other results:    Imaging: No results found.   Medications:     Scheduled Medications: . acetaminophen  1,000 mg Oral Q6H   Or  . acetaminophen (TYLENOL) oral liquid 160 mg/5 mL  1,000 mg Oral Q6H  . allopurinol  300 mg Oral Daily  . aspirin EC  325 mg Oral Daily  . bisacodyl  10 mg Oral Daily  . ceFEPime (MAXIPIME) IV  1 g Intravenous Q8H  . hydrALAZINE  25 mg Oral BID  . Influenza vac split quadrivalent PF  0.5 mL Intramuscular  Tomorrow-1000  . mexiletine  150 mg Oral BID  . multivitamin with minerals  1 tablet Oral Daily  . pantoprazole  40 mg Oral Daily  . pneumococcal 23 valent vaccine  0.5 mL Intramuscular Tomorrow-1000  . sacubitril-valsartan  1 tablet Oral BID  . senna-docusate  1 tablet Oral QHS  . warfarin  7.5 mg Oral q1800  . Warfarin - Physician Dosing Inpatient   Does not apply q1800    Infusions: . dextrose 5 % and 0.45% NaCl    . heparin 1,350 Units/hr (03/14/16 0838)    PRN Medications: fentaNYL (SUBLIMAZE) injection, ondansetron (ZOFRAN) IV, oxyCODONE, traMADol   Assessment/Plan    1) Driveline infection, recurrent  - S/p wound debridement and I&D 12/8. Wound vac in place - has some bleeding at site yesterday from trauma. Wound vac removed. Site packed. Now improved. Heparin restarted. I spoke with Dr. Donata Clay who will replace wound vac in next few days.  -  Bcx negative. Following with ID - Wound culture 02/25/16 with Serratia again. Not sensitive to Cipro. Not currently on po ABX.  - Discussed with [facility] regarding possible Ia time due to recurrent Driveline infection but in order to prioritize would require culture data and evidence of deep infection (need for debridement, etc).  2) Chronic systolic HF, s/p HMII LVAD implant 05/2012.  - NYHA I-II. Stable at this time. - Now dual listed at [facility] and [facility]. Recent RHC looks great.  - Intolerant to even low dose BB. - Continue current meds. Can use lasix as needed.  - Recent Driveline infection (see below) 3) Sore throat - Has some trauma from ETT. Improving 4) HTN-  - Hydralazine increased and amlodipine decreased. MAPs look good  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 - ICD now at EOL. We have decided to not replace given that risk of infection felt to be higher than benefit or replacing as VT usually well tolerated in setting of VAD support 6) PVCs - Much improved with treatment of hyperthyroidism.  7) Anticoagulation with coumadin - ASA 325 mg for LVAD. - Continue heparin. Will need to bring INR back up once stable after surgery.  8) LVAD - Stable on 9400. - INR 1.2 today. Continue heparin/coumadin 9) Hyperthyroidism - Amio induced. Follows with Dr. Evlyn Kanner. Now off amio.  - He is now off methimazole.   I reviewed the LVAD parameters from today, and compared the results to the patient's prior recorded data.  No programming changes were made.  The LVAD is functioning within specified parameters.  The patient performs LVAD self-test daily.  LVAD interrogation was negative for any significant power changes, alarms or PI events/speed drops.  LVAD equipment check completed and is in good working order.  Back-up equipment present.   LVAD education done on emergency procedures and precautions and reviewed exit site care.  Length of Stay: 5  Arvilla Meres, MD 03/14/2016, 12:10 PM  VAD Team  --- VAD ISSUES ONLY--- Pager [phone] (7am - 7am)  Advanced Heart Failure Team Pager [phone] (M-F; 7a - 4p)  Please contact [facility] for night-coverage after hours (4p -7a ) and weekends on [email or web address]

## 2016-03-14 NOTE — Progress Notes (Signed)
2 Days Post-Op Procedure(s) (LRB): WOUND DEBRIDEMENT AT LVAD SITE (N/A) APPLICATION OF WOUND VAC (N/A) Subjective: Wound explored x2 today for bleeding from small subQ vessels which were suture ligated Bleeding occurred when heparin dose > 1000 Will hold heparin for 2 hrs then resume at 1000u,hr until w/o increasing until mon am  Objective: Vital signs in last 24 hours: Temp:  [97.7 F (36.5 C)-98 F (36.7 C)] 97.7 F (36.5 C) (12/10 1519) Pulse Rate:  [60-83] 66 (12/10 1519) Cardiac Rhythm: Normal sinus rhythm (12/10 0800) Resp:  [16-22] 21 (12/10 1519) BP: (86-110)/(73-93) 100/85 (12/10 1519) SpO2:  [96 %-99 %] 99 % (12/10 1519) Weight:  [246 lb 9.6 oz (111.9 kg)] 246 lb 9.6 oz (111.9 kg) (12/10 0327)  Hemodynamic parameters for last 24 hours:   stable Intake/Output from previous day: 12/09 0701 - 12/10 0700 In: 468.5 [P.O.:240; I.V.:228.5] Out: -  Intake/Output this shift: Total I/O In: 277.9 [P.O.:240; I.V.:37.9] Out: -        Exam    General- alert and comfortable   Lungs- clear without rales, wheezes   Cor- regular rate and rhythm, no murmur , gallop   Abdomen- soft, non-tender   Extremities - warm, non-tender, minimal edema   Neuro- oriented, appropriate, no focal weakness   Lab Results:  Recent Labs  03/13/16 0135 03/14/16 0202  WBC 8.4 8.9  HGB 13.3 13.1  HCT 40.0 40.0  PLT 148* 149*   BMET:  Recent Labs  03/13/16 0135 03/14/16 0202  NA 134* 135  K 3.7 3.9  CL 101 102  CO2 26 22  GLUCOSE 112* 95  BUN 9 12  CREATININE 1.13 1.21  CALCIUM 8.6* 8.8*    PT/INR:  Recent Labs  03/14/16 0202  LABPROT 15.4*  INR 1.21   ABG    Component Value Date/Time   PHART 7.404 03/12/2016 0326   HCO3 24.9 03/12/2016 0326   TCO2 26 11/06/2015 0825   ACIDBASEDEF 2.0 06/10/2014 1337   O2SAT 95.8 03/12/2016 0326   CBG (last 3)  No results for input(s): GLUCAP in the last 72 hours.  Assessment/Plan: S/P Procedure(s) (LRB): WOUND DEBRIDEMENT AT  LVAD SITE (N/A) APPLICATION OF WOUND VAC (N/A) Cont coumadin Heparin max dose 1000/ hr due to recurrent bleeding   LOS: 5 days    Kathlee Nations Trigt III 03/14/2016

## 2016-03-14 NOTE — Progress Notes (Addendum)
ANTICOAGULATION CONSULT NOTE  Pharmacy Consult for heparin Indication: LVAD  Allergies  Allergen Reactions  . Metoprolol Other (See Comments)    Passes out  . Imdur [Isosorbide Nitrate] Other (See Comments)    HEADACHE--REACTION IS SIDE EFFECT OF NITRATES    Patient Measurements: Height: 6\' 2"  (188 cm) Weight: 246 lb 9.6 oz (111.9 kg) IBW/kg (Calculated) : 82.2 Heparin Dosing Weight: 106  Vital Signs: Temp: 97.9 F (36.6 C) (12/10 0724) Temp Source: Oral (12/10 0724) BP: 86/73 (12/10 0327) Pulse Rate: 60 (12/10 0327)  Labs:  Recent Labs  03/12/16 0213  03/13/16 0135 03/13/16 0804 03/13/16 2236 03/14/16 0202  HGB 13.4  --  13.3  --   --  13.1  HCT 40.2  --  40.0  --   --  40.0  PLT 161  --  148*  --   --  149*  APTT 140*  --   --   --   --   --   LABPROT 15.5*  --  15.4*  --   --  15.4*  INR 1.22  --  1.22  --   --  1.21  HEPARINUNFRC  --   < >  --  0.25* 0.20* 0.17*  CREATININE  --   --  1.13  --   --  1.21  < > = values in this interval not displayed.  Estimated Creatinine Clearance: 86.4 mL/min (by C-G formula based on SCr of 1.21 mg/dL).  Assessment: 60 y.o. male with LVAD s/p 2014 on chronic warfarin therapy. Pharmacy consulted to dose heparin and warfarin while INR is subtherapeutic. Pt is s/p LVAD debridement. PTA warfarin is 7.5 mg daily.   Per MD will continue heparin with low goal of 0.2 - 0.3 through this afternoon due to oozing from site of drive line yesterday. Current heparin level is 0.17, INR is 1.21. CBC stable.   Goal of Therapy:  Heparin level  0.2-0.3 (transition to goal of 0.3 - 0.7 this afternoon if no bleeding) Monitor platelets by anticoagulation protocol: Yes   Plan:  Increase heparin IV to 1350 units/hr Warfarin 7.5 mg daily per MD 6 hr heparin level Daily heparin level and CBC Monitor for additional bleeding  Sandi Carne, PharmD, BCPS Pharmacy Resident Pager: [phone]

## 2016-03-15 LAB — CBC
HCT: 38.5 % — ABNORMAL LOW (ref 39.0–52.0)
Hemoglobin: 12.8 g/dL — ABNORMAL LOW (ref 13.0–17.0)
MCH: 28.7 pg (ref 26.0–34.0)
MCHC: 33.2 g/dL (ref 30.0–36.0)
MCV: 86.3 fL (ref 78.0–100.0)
Platelets: 145 10*3/uL — ABNORMAL LOW (ref 150–400)
RBC: 4.46 MIL/uL (ref 4.22–5.81)
RDW: 15.6 % — ABNORMAL HIGH (ref 11.5–15.5)
WBC: 7.9 10*3/uL (ref 4.0–10.5)

## 2016-03-15 LAB — BASIC METABOLIC PANEL
ANION GAP: 7 (ref 5–15)
BUN: 10 mg/dL (ref 6–20)
CHLORIDE: 105 mmol/L (ref 101–111)
CO2: 24 mmol/L (ref 22–32)
Calcium: 8.8 mg/dL — ABNORMAL LOW (ref 8.9–10.3)
Creatinine, Ser: 1.02 mg/dL (ref 0.61–1.24)
GFR calc non Af Amer: >60 mL/min (ref >60)
Glucose, Bld: 91 mg/dL (ref 65–99)
POTASSIUM: 4 mmol/L (ref 3.5–5.1)
SODIUM: 136 mmol/L (ref 135–145)

## 2016-03-15 LAB — LACTATE DEHYDROGENASE: LDH: 174 U/L (ref 98–192)

## 2016-03-15 LAB — HEPARIN LEVEL (UNFRACTIONATED): HEPARIN UNFRACTIONATED: 0.13 [IU]/mL — AB (ref 0.30–0.70)

## 2016-03-15 LAB — PROTIME-INR
INR: 1.22
PROTHROMBIN TIME: 15.4 s — AB (ref 11.4–15.2)

## 2016-03-15 MED ORDER — HEPARIN (PORCINE) IN NACL 100-0.45 UNIT/ML-% IJ SOLN
1000.0000 [IU]/h | INTRAMUSCULAR | Status: DC
  Administered 2016-03-15: 800 [IU]/h via INTRAVENOUS
  Administered 2016-03-16: 1000 [IU]/h via INTRAVENOUS
  Filled 2016-03-15: qty 250

## 2016-03-15 MED ORDER — SILVER NITRATE-POT NITRATE 75-25 % EX MISC
1.0000 | CUTANEOUS | Status: DC | PRN
  Filled 2016-03-15: qty 1

## 2016-03-15 MED ORDER — HEPARIN (PORCINE) IN NACL 100-0.45 UNIT/ML-% IJ SOLN
1000.0000 [IU]/h | INTRAMUSCULAR | Status: DC
  Administered 2016-03-15: 1000 [IU]/h via INTRAVENOUS
  Filled 2016-03-15: qty 250

## 2016-03-15 MED ORDER — HYDRALAZINE HCL 25 MG PO TABS
37.5000 mg | ORAL_TABLET | Freq: Two times a day (BID) | ORAL | Status: DC
  Administered 2016-03-15 – 2016-03-16 (×3): 37.5 mg via ORAL
  Filled 2016-03-15 (×3): qty 2

## 2016-03-15 MED ORDER — SILVER NITRATE-POT NITRATE 75-25 % EX MISC
1.0000 | Freq: Once | CUTANEOUS | Status: DC
  Filled 2016-03-15: qty 1

## 2016-03-15 MED ORDER — HYDRALAZINE HCL 20 MG/ML IJ SOLN: 10.0000 mg | INTRAMUSCULAR | Status: DC | PRN

## 2016-03-15 NOTE — Progress Notes (Signed)
Pt walked in hallway. Viewable dressing CD & I.

## 2016-03-15 NOTE — Progress Notes (Addendum)
I changed [name]'s dressing this am. I removed 2 damp 4x4's. Wound has granulation and no purulence. Drainage is sero sanguinous on dressings. I removed tape and dry 4x4's. I removed 2 damp 4x4's that were packed into the wound. After pi=utting on sterile gloves, I used Hibiclenz to clean skin around power cord site. I then packed 3  4x4's in (damp with normal saline)to the wound. Dry 4x4's with tape were then placed. Patient tolerated the procedure well. Of note, Serratia Marcescens was cultured from the wound and he is on Cefepime, which has good sensitivity. I will change dressing daily and if not available, will have one of my colleagues change it.

## 2016-03-15 NOTE — Plan of Care (Signed)
Problem: Skin Integrity: Goal: Demonstration of wound healing without infection will improve Outcome: Progressing Pt with slightly increased drainage to driveline dsg at 0400 12/11: appears to be serousanguineous drainage. Outline marked and time noted; drainage does not saturate dressing. Will continue to assess.

## 2016-03-15 NOTE — Progress Notes (Signed)
Advanced Heart Failure VAD Team Note  Subjective:    Admitted 03/09/16 for heparin bridge for Driveline debridement   Underwent debridement of driveline wound with placement of wound vac12/8.   Wound vac removed yesterday due to bleeding. Site packed. Heparin restarted. Today dressing changed earlier this morning. Now bleeding through dressing.     Denies pain/SOB.   LVAD INTERROGATION:  HeartMate II LVAD:  Flow 5.4 liters/min, speed 9400, power 6.0, PI 6.3 No PI events  Objective:    Vital Signs:   Temp:  [97.7 F (36.5 C)-98.3 F (36.8 C)] 97.7 F (36.5 C) (12/11 0723) Pulse Rate:  [66-81] 66 (12/11 0723) Resp:  [18-23] 18 (12/11 0723) BP: (100-110)/(64-95) 105/93 (12/11 0723) SpO2:  [98 %-99 %] 98 % (12/11 0723) Weight:  [247 lb 6.4 oz (112.2 kg)] 247 lb 6.4 oz (112.2 kg) (12/11 0358) Last BM Date: 03/14/16 (per pt report)   Mean arterial Pressure 100s  Intake/Output:   Intake/Output Summary (Last 24 hours) at 03/15/16 0804 Last data filed at 03/15/16 0600  Gross per 24 hour  Intake           1813.2 ml  Output                0 ml  Net           1813.2 ml     Physical Exam: GENERAL: WDWN, NAD.  HEENT: Normal  uvula ecchymotic and swollen NECK: Supple, JVP ~7; 2+ bilaterally, no bruits. No thyromegaly or nodule noted.  CARDIAC: Mechanical heart sounds with LVAD hum present.  LUNGS: Clear, normal effort. ABDOMEN: Soft, round, NT, ND, no HSM. No bruits or masses. +BS  LVAD exit site:  Drive line dressing with bleeding through dressing.  EXTREMITIES: Warm and dry, no cyanosis, clubbing, rash. No peripheral edema. NEUROLOGIC: Alert and oriented x 4. Gait steady. No aphasia. No dysarthria.Affect pleasant.   Telemetry: NSR  Labs: Basic Metabolic Panel:  Recent Labs Lab 03/10/16 0432 03/13/16 0135 03/14/16 0202 03/15/16 0353  NA 138 134* 135 136  K 3.6 3.7 3.9 4.0  CL 105 101 102 105  CO2 27 26 22 24   GLUCOSE 88 112* 95 91  BUN 9 9 12 10     CREATININE 1.00 1.13 1.21 1.02  CALCIUM 8.5* 8.6* 8.8* 8.8*    Liver Function Tests:  Recent Labs Lab 03/14/16 0202  AST 15  ALT 13*  ALKPHOS 79  BILITOT 0.6  PROT 6.5  ALBUMIN 3.6   No results for input(s): LIPASE, AMYLASE in the last 168 hours. No results for input(s): AMMONIA in the last 168 hours.  CBC:  Recent Labs Lab 03/11/16 0323 03/12/16 0213 03/13/16 0135 03/14/16 0202 03/15/16 0353  WBC 7.9 7.5 8.4 8.9 7.9  HGB 14.2 13.4 13.3 13.1 12.8*  HCT 42.6 40.2 40.0 40.0 38.5*  MCV 84.7 85.4 85.1 86.4 86.3  PLT 180 161 148* 149* 145*    INR:  Recent Labs Lab 03/11/16 0323 03/12/16 0213 03/13/16 0135 03/14/16 0202 03/15/16 0400  INR 1.30 1.22 1.22 1.21 1.22    Other results:    Imaging: No results found.   Medications:     Scheduled Medications: . acetaminophen  1,000 mg Oral Q6H   Or  . acetaminophen (TYLENOL) oral liquid 160 mg/5 mL  1,000 mg Oral Q6H  . allopurinol  300 mg Oral Daily  . aspirin EC  325 mg Oral Daily  . bisacodyl  10 mg Oral Daily  . ceFEPime (MAXIPIME) IV  1 g Intravenous Q8H  . hydrALAZINE  25 mg Oral BID  . Influenza vac split quadrivalent PF  0.5 mL Intramuscular Tomorrow-1000  . mexiletine  150 mg Oral BID  . multivitamin with minerals  1 tablet Oral Daily  . pantoprazole  40 mg Oral Daily  . pneumococcal 23 valent vaccine  0.5 mL Intramuscular Tomorrow-1000  . sacubitril-valsartan  1 tablet Oral BID  . senna-docusate  1 tablet Oral QHS  . warfarin  7.5 mg Oral q1800  . Warfarin - Physician Dosing Inpatient   Does not apply q1800    Infusions: . dextrose 5 % and 0.45% NaCl    . heparin 1,000 Units/hr (03/15/16 0500)    PRN Medications: fentaNYL (SUBLIMAZE) injection, ondansetron (ZOFRAN) IV, oxyCODONE, traMADol   Assessment/Plan    1) Driveline infection, recurrent  - S/p wound debridement and I&D 12/8. Wound vac in place - has some bleeding at site yesterday from trauma. Wound vac removed. Site  packed earlier this morning. Now with bleeding. Dressing changed with bleeding note in the corners. Silver nitrate applied to the corners > kerlix was repacked in the driveline wound.  - Wound cultures- Serratia again. On cefepime.   - Discussed with [facility] regarding possible Ia time due to recurrent Driveline infection but in order to prioritize would require culture data and evidence of deep infection (need for debridement, etc).  2) Chronic systolic HF, s/p HMII LVAD implant 05/2012.  - NYHA I-II. Stable at this time. - [facility] Recent RHC looks great.  - Intolerant to even low dose BB. - Continue current meds. Can use lasix as needed.  3) Sore throat - Has some trauma from ETT. Improving 4) HTN-  -Continue entresto 97/103 twice a day. Increase hydralazine to 37.5 mg twice a day.  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 - ICD now at EOL. We have decided to not replace given that risk of infection felt to be higher than benefit or replacing as VT usually well tolerated in setting of VAD support 6) PVCs - Much improved with treatment of hyperthyroidism.  7) Anticoagulation with coumadin - ASA 325 mg for LVAD. - Continue heparin. 8) LVAD  - Stable on 9400. - INR 1.2 today. Continue heparin/coumadin 9) Hyperthyroidism - Amio induced. [place] [place]. Now off amio.  - He is now off methimazole.   I reviewed the LVAD parameters from today, and compared the results to the patient's prior recorded data.  No programming changes were made.  The LVAD is functioning within specified parameters.  The patient performs LVAD self-test daily.  LVAD interrogation was negative for any significant power changes, alarms or PI events/speed drops.  LVAD equipment check completed and is in good working order.  Back-up equipment present.   LVAD education done on emergency procedures and precautions and reviewed exit site care.  Length of Stay: 6  Amy Clegg, NP 03/15/2016, 8:04  AM  VAD Team --- VAD ISSUES ONLY--- Pager [phone] (7am - 7am)  Advanced Heart Failure Team Pager [phone] (M-F; 7a - 4p)  Please contact [facility] for night-coverage after hours (4p -7a ) and weekends on [email or web address]  Patient seen and examined with Tonye BecketAmy Clegg, NP. We discussed all aspects of the encounter. I agree with the assessment and plan as stated above.   Wound was bleeding this am so was undressed and several bleeders were cauterized with silver nitrate and repacked by Tonye BecketAmy Clegg with my assistance. Will continue  heparin/coumadin. Will likely need replacement of wound vac per Dr. Donata Clay. VAD parameters stable. Volume status looks good. Coumadin dosing d/w Pharmacy.   Bensimhon, Daniel,MD 12:26 PM

## 2016-03-15 NOTE — Op Note (Signed)
NAME:  Robert [name], Robert [name] NO.:  [id number]  MEDICAL RECORD NO.:  [id number]  LOCATION:                                 FACILITY:  PHYSICIAN:  Kerin Perna, M.D.  DATE OF BIRTH:  1956-02-28  DATE OF PROCEDURE:  03/12/2016 DATE OF DISCHARGE:                              OPERATIVE REPORT   OPERATION: 1. Excisional debridement of deep abdominal wound infection. 2. Placement of wound VAC. 3. Wound irrigation with antibiotic solution.  SURGEON:  Kerin Perna, M.D.  PREOPERATIVE DIAGNOSIS:  Deep abdominal wound infection at VAD power cord exit site.  POSTOPERATIVE DIAGNOSIS:  Deep abdominal wound infection at VAD power cord exit site.  ANESTHESIA:  General.  DESCRIPTION OF PROCEDURE:  After informed consent was obtained and the procedure including benefits and risks discussed fully with the patient, the patient was brought to the operating room and placed on the operating table.  General anesthesia was induced.  The patient's vital signs were monitored continuously using Doppler blood pressure technique.  The abdomen was prepped and draped as a sterile field.  A proper time-out was performed.  There was a 3-inch space surrounding the driveline as it exited the right side of the abdomen.  The skin over the space was incised sharply. This was carried up to the area, where the driveline had a textured surface which was incorporated into the abdominal wall without space. This space had been cultured.  Sharp debridement of the chronically inflamed and infected tissue was then performed.  Clean normal subcutaneous fat was obtained.  This was then irrigated with antibiotic irrigation.  Hemostasis was obtained.  A wound VAC sponge was then placed around the power cord to fill the soft tissue defect and sterile drapes were passed over the wound.  An opening was made for the suction line to attach, and this was connected to a 75 mm of suction.  The patient had  previously been cultured as having Serratia marcescens in the wound.  Deep tissue cultures were obtained and sent to Microbiology.  The patient was reversed from anesthesia, extubated, and returned to recovery room.     Kerin Perna, M.D.     PV/MEDQ  D:  03/12/2016  T:  03/13/2016  Job:  [id number]

## 2016-03-15 NOTE — Progress Notes (Signed)
LVAD drive line site/incision cauterized and repacked w/wet to dry dressing per Dr. Donata Clay, MD per sterile procedure at bedside. Pt tolerated well.  New orders for anticoagulation to be placed.  Heparin now off.

## 2016-03-15 NOTE — Progress Notes (Signed)
ANTICOAGULATION CONSULT NOTE  Pharmacy Consult for heparin Indication: LVAD  Allergies  Allergen Reactions  . Metoprolol Other (See Comments)    Passes out  . Imdur [Isosorbide Nitrate] Other (See Comments)    HEADACHE--REACTION IS SIDE EFFECT OF NITRATES    Patient Measurements: Height: 6\' 2"  (188 cm) Weight: 247 lb 6.4 oz (112.2 kg) IBW/kg (Calculated) : 82.2 Heparin Dosing Weight: 106  Vital Signs: Temp: 97.6 F (36.4 C) (12/11 1102) Temp Source: Oral (12/11 1102) BP: 103/89 (12/11 1102) Pulse Rate: 64 (12/11 1102)  Labs:  Recent Labs  03/13/16 0135  03/13/16 2236 03/14/16 0202 03/15/16 0353 03/15/16 0400  HGB 13.3  --   --  13.1 12.8*  --   HCT 40.0  --   --  40.0 38.5*  --   PLT 148*  --   --  149* 145*  --   LABPROT 15.4*  --   --  15.4*  --  15.4*  INR 1.22  --   --  1.21  --  1.22  HEPARINUNFRC  --   < > 0.20* 0.17*  --  0.13*  CREATININE 1.13  --   --  1.21 1.02  --   < > = values in this interval not displayed.  Estimated Creatinine Clearance: 102.6 mL/min (by C-G formula based on SCr of 1.02 mg/dL).  Assessment: 60 y.o. male with LVAD s/p 2014 on chronic warfarin therapy. PTA warfarin is 7.5 mg daily.   Dr.Van Maudie Flakes has been managing anticoagulation issues. Situation very complicated as INR unchanged at 1.2 on home dose of warfarin for the past two days, will continue to follow MD dosing. Heparin level low this morning after rate reduced by MD yesterday. Slight oozing noted overnight, but then significant bleeding/saturation noted after dressing change this am. HF NP reapplied new dressing and applied silver nitrate sticks applied to help with bleeding.   Given bleeding will leave heparin at lower rate of 1000/hr for now. Will await further instruction by surgery.   Goal of Therapy:  Heparin level  Goal 0.3 per MD INR goal 2-2.5 Monitor platelets by anticoagulation protocol: Yes   Plan:  Continue heparin IV at 1000 units/hr Warfarin 7.5 mg daily  per MD Daily heparin level and CBC Monitor for additional bleeding  Sheppard Coil PharmD., BCPS Clinical Pharmacist Pager [phone] 03/15/2016 11:48 AM

## 2016-03-15 NOTE — Progress Notes (Signed)
3 Days Post-Op Procedure(s) (LRB): WOUND DEBRIDEMENT AT LVAD SITE (N/A) APPLICATION OF WOUND VAC (N/A) Subjective: Debridement of driveline  exit site for persistent Serratia infection Bleeding from subcutaneous fat vessels after IV heparin resumed Oral Coumadin dosing has been resumed Wound cultures positive for Serratia marcescens treated with IV Maxipime Patient will need PICC line and home IV antibiotics for 10-14 days after discharge  Objective: Vital signs in last 24 hours: Temp:  [97.6 F (36.4 C)-98.3 F (36.8 C)] 97.6 F (36.4 C) (12/11 1102) Pulse Rate:  [64-81] 64 (12/11 1102) Cardiac Rhythm: Normal sinus rhythm;Heart block (12/11 0800) Resp:  [18-23] 22 (12/11 1102) BP: (100-110)/(64-95) 105/85 (12/11 1200) SpO2:  [98 %-99 %] 99 % (12/11 1102) Weight:  [247 lb 6.4 oz (112.2 kg)] 247 lb 6.4 oz (112.2 kg) (12/11 0358)  Hemodynamic parameters for last 24 hours:   Afebrile Intake/Output from previous day: 12/10 0701 - 12/11 0700 In: 1813.2 [P.O.:1300; I.V.:163.2; IV Piggyback:350] Out: -  Intake/Output this shift: Total I/O In: 310 [P.O.:240; I.V.:70] Out: -        Exam    General- alert and comfortable   Lungs- clear without rales, wheezes   Cor- regular rate and rhythm, LVAD hum present   Abdomen- soft, non-tender, wound with packing in place   Extremities - warm, non-tender, minimal edema   Neuro- oriented, appropriate, no focal weakness   Lab Results:  Recent Labs  03/14/16 0202 03/15/16 0353  WBC 8.9 7.9  HGB 13.1 12.8*  HCT 40.0 38.5*  PLT 149* 145*   BMET:  Recent Labs  03/14/16 0202 03/15/16 0353  NA 135 136  K 3.9 4.0  CL 102 105  CO2 22 24  GLUCOSE 95 91  BUN 12 10  CREATININE 1.21 1.02  CALCIUM 8.8* 8.8*    PT/INR:  Recent Labs  03/15/16 0400  LABPROT 15.4*  INR 1.22   ABG    Component Value Date/Time   PHART 7.404 03/12/2016 0326   HCO3 24.9 03/12/2016 0326   TCO2 26 11/06/2015 0825   ACIDBASEDEF 2.0 06/10/2014  1337   O2SAT 95.8 03/12/2016 0326   CBG (last 3)  No results for input(s): GLUCAP in the last 72 hours.  Assessment/Plan: S/P Procedure(s) (LRB): WOUND DEBRIDEMENT AT LVAD SITE (N/A) APPLICATION OF WOUND VAC (N/A) Continue IV heparin, low-dose well Coumadin is being redosed Daily wound packing IV Maxipime for Serratia infection   LOS: 6 days    Kathlee Nations Trigt III 03/15/2016

## 2016-03-15 NOTE — Consult Note (Addendum)
WOC Nurse wound follow up Wound type: Consult was previously requested for Vac dressing change on Monday which was applied Fri in the OR. Vac was discontinued this weekend related to bleeding, according to the EMR. CT surgical PA changed dressing today; refer to their progress notes regarding full thickness wound over LVAD site.   Please re-consult WOC team if  if Vac is desired to be re-placed when bleeding has resolved. Thank-you,  Cammie Mcgee MSN, RN, CWOCN, [place], CNS [phone]

## 2016-03-16 LAB — CBC
HCT: 37.8 % — ABNORMAL LOW (ref 39.0–52.0)
Hemoglobin: 12.4 g/dL — ABNORMAL LOW (ref 13.0–17.0)
MCH: 28.4 pg (ref 26.0–34.0)
MCHC: 32.8 g/dL (ref 30.0–36.0)
MCV: 86.5 fL (ref 78.0–100.0)
Platelets: 137 10*3/uL — ABNORMAL LOW (ref 150–400)
RBC: 4.37 MIL/uL (ref 4.22–5.81)
RDW: 15.8 % — ABNORMAL HIGH (ref 11.5–15.5)
WBC: 7.9 10*3/uL (ref 4.0–10.5)

## 2016-03-16 LAB — BASIC METABOLIC PANEL
Anion gap: 7 (ref 5–15)
BUN: 10 mg/dL (ref 6–20)
CHLORIDE: 105 mmol/L (ref 101–111)
CO2: 25 mmol/L (ref 22–32)
Calcium: 8.8 mg/dL — ABNORMAL LOW (ref 8.9–10.3)
Creatinine, Ser: 1.03 mg/dL (ref 0.61–1.24)
GFR calc Af Amer: >60 mL/min (ref >60)
GLUCOSE: 88 mg/dL (ref 65–99)
POTASSIUM: 4.3 mmol/L (ref 3.5–5.1)
Sodium: 137 mmol/L (ref 135–145)

## 2016-03-16 LAB — PROTIME-INR
INR: 1.29
PROTHROMBIN TIME: 16.2 s — AB (ref 11.4–15.2)

## 2016-03-16 LAB — HEPARIN LEVEL (UNFRACTIONATED): Heparin Unfractionated: <0.1 IU/mL — ABNORMAL LOW (ref 0.30–0.70)

## 2016-03-16 LAB — LACTATE DEHYDROGENASE: LDH: 179 U/L (ref 98–192)

## 2016-03-16 MED ORDER — SODIUM CHLORIDE 0.9% FLUSH: 10.0000 mL | INTRAVENOUS | Status: DC | PRN

## 2016-03-16 MED ORDER — WARFARIN SODIUM 10 MG PO TABS
10.0000 mg | ORAL_TABLET | Freq: Once | ORAL | Status: AC
  Administered 2016-03-16: 10 mg via ORAL
  Filled 2016-03-16: qty 1

## 2016-03-16 MED ORDER — SODIUM CHLORIDE 0.9% FLUSH
10.0000 mL | Freq: Two times a day (BID) | INTRAVENOUS | Status: DC
  Administered 2016-03-16 – 2016-03-19 (×7): 10 mL

## 2016-03-16 NOTE — Progress Notes (Signed)
LVAD site dressing changed per Dr. Donata Clay.  Old packing removed w/minimal serosanguinous drainage.  Wound bed clean and dry.  No active bleeding noted.  Packed w/NS wet to dry dressing and covered w/sterile 4x4s; secured w/medipore tape.  Plan to re-initiate wound vac dressing tomorrow Am.  Plan discussed w/patient.  Patient tolerated well.

## 2016-03-16 NOTE — Care Management Note (Signed)
Case Management Note  Patient Details  Name: Robert [name] MRN: [id number] Date of Birth: [birth year]  Subjective/Objective:         Adm w lvad driveline infection           Action/Plan: to return home w fam and hhc for iv antibiotics.   Expected Discharge Date:                  Expected Discharge Plan:  Home w Home Health Services  In-House Referral:     Discharge planning Services  CM Consult  Post Acute Care Choice:  Durable Medical Equipment, Home Health Choice offered to:  Patient  DME Arranged:  IV pump/equipment DME Agency:  [facility]  HH Arranged:  RN [facility] Agency:  [facility]  Status of Service:  In process, will continue to follow  If discussed at Long Length of Stay Meetings, dates discussed:    Additional Comments:went over list of hhc agencies and no pref. Ref to donna w [facility] for home iv antibiotics and poss wound care. Cont to follow. Hanley Hays, RN 03/16/2016, 10:31 AM

## 2016-03-16 NOTE — Progress Notes (Signed)
[facility]  New pt for [facility] this admission  [facility] will provide [facility] infusion pharmacy team for home IV ABX and other skilled services as ordered.  [facility] will follow pt while an inpatient to support transition home when ordered.  If patient discharges after hours, please call [phone].   Sedalia Muta 03/16/2016, 6:00 PM

## 2016-03-16 NOTE — Progress Notes (Signed)
4 Days Post-Op Procedure(s) (LRB): WOUND DEBRIDEMENT AT LVAD SITE (N/A) APPLICATION OF WOUND VAC (N/A) Subjective:  No bleeding from wound overnight on heparin 800 units per hour INR now 1.3-will increase Coumadin to 10 mg daily Surgical wound repacked this a.m.-clean without bleeding. Will increase heparin to 1000 units per hour until tomorrow  PICC line placed for home IV Maxipime for Serratia deep tissue infection at driveline exit site  [facility] plan on placing wound VAC tomorrow at bedside with topical a cell powder placed in the depths of the wound  Patient will remain hospitalized until INR that she is therapeutic range and the wound is stable and not bleeding  Patient will return to the VAD clinic for wound VAC changes on a weekly basis with a cell application. Home health nursing will not change the wound VAC. Objective: Vital signs in last 24 hours: Temp:  [97.6 F (36.4 C)-98.4 F (36.9 C)] 97.6 F (36.4 C) (12/12 1112) Pulse Rate:  [63-80] 80 (12/12 1112) Cardiac Rhythm: Normal sinus rhythm;Heart block;Bundle branch block (12/12 0800) Resp:  [14-26] 23 (12/12 1112) BP: (99-119)/(83-98) 101/87 (12/12 1225) SpO2:  [94 %-99 %] 94 % (12/12 1112) Weight:  [248 lb 6.4 oz (112.7 kg)] 248 lb 6.4 oz (112.7 kg) (12/12 0500)  Hemodynamic parameters for last 24 hours:  stable  Intake/Output from previous day: 12/11 0701 - 12/12 0700 In: 897.7 [P.O.:600; I.V.:147.7; IV Piggyback:150] Out: -  Intake/Output this shift: Total I/O In: 271.9 [P.O.:240; I.V.:31.9] Out: -        Exam    General- alert and comfortable   Lungs- clear without rales, wheezes   Cor- regular rate and rhythm, no murmur , gallop   Abdomen- soft, non-tender   Extremities - warm, non-tender, minimal edema   Neuro- oriented, appropriate, no focal weakness   Lab Results:  Recent Labs  03/15/16 0353 03/16/16 0209  WBC 7.9 7.9  HGB 12.8* 12.4*  HCT 38.5* 37.8*  PLT 145* 137*   BMET:  Recent  Labs  03/15/16 0353 03/16/16 0209  NA 136 137  K 4.0 4.3  CL 105 105  CO2 24 25  GLUCOSE 91 88  BUN 10 10  CREATININE 1.02 1.03  CALCIUM 8.8* 8.8*    PT/INR:  Recent Labs  03/16/16 0209  LABPROT 16.2*  INR 1.29   ABG    Component Value Date/Time   PHART 7.404 03/12/2016 0326   HCO3 24.9 03/12/2016 0326   TCO2 26 11/06/2015 0825   ACIDBASEDEF 2.0 06/10/2014 1337   O2SAT 95.8 03/12/2016 0326   CBG (last 3)  No results for input(s): GLUCAP in the last 72 hours.  Assessment/Plan: S/P Procedure(s) (LRB): WOUND DEBRIDEMENT AT LVAD SITE (N/A) APPLICATION OF WOUND VAC (N/A) Mobilize Continue current wound care and IV Maxipime  Plan application of wound VAC tomorrow   LOS: 7 days    Robert [name] 03/16/2016

## 2016-03-16 NOTE — Progress Notes (Signed)
Advanced Heart Failure VAD Team Note  Subjective:    Admitted 03/09/16 for heparin bridge for Driveline debridement   Underwent debridement of driveline wound with placement of wound vac12/8.   Bleeding from drive line wound x2. Dr Maren Beach cauterized at the bedside. Heparin held for a couple of hours INR 1.29    Denies pain/SOB.   LVAD INTERROGATION:  HeartMate II LVAD:  Flow 5.0 liters/min, speed 9400, power 5.5, PI 7.1 Objective:    Vital Signs:   Temp:  [97.6 F (36.4 C)-98.4 F (36.9 C)] 98.2 F (36.8 C) (12/12 0730) Pulse Rate:  [63-78] 64 (12/12 0730) Resp:  [14-26] 19 (12/12 0730) BP: (99-119)/(83-98) 103/89 (12/12 0759) SpO2:  [98 %-99 %] 98 % (12/12 0730) Weight:  [248 lb 6.4 oz (112.7 kg)] 248 lb 6.4 oz (112.7 kg) (12/12 0500) Last BM Date: 03/15/16   Mean arterial Pressure  90s  Intake/Output:   Intake/Output Summary (Last 24 hours) at 03/16/16 0811 Last data filed at 03/16/16 0759  Gross per 24 hour  Intake            859.6 ml  Output                0 ml  Net            859.6 ml     Physical Exam: GENERAL: NAD. In be d HEENT: Normal  NECK: Supple, JVP ~7; 2+ bilaterally, no bruits. No thyromegaly or nodule noted.  CARDIAC: Mechanical heart sounds with LVAD hum present.  LUNGS: Clear, normal effort. ABDOMEN: Soft, round, NT, ND, no HSM. No bruits or masses. +BS  LVAD exit site:  Drive line dressing CDI  EXTREMITIES: Warm and dry, no cyanosis, clubbing, rash. No peripheral edema. NEUROLOGIC: Alert and oriented x 4. Gait steady. No aphasia. No dysarthria.Affect pleasant.   Telemetry: NSR  Labs: Basic Metabolic Panel:  Recent Labs Lab 03/10/16 0432 03/13/16 0135 03/14/16 0202 03/15/16 0353 03/16/16 0209  NA 138 134* 135 136 137  K 3.6 3.7 3.9 4.0 4.3  CL 105 101 102 105 105  CO2 27 26 22 24 25   GLUCOSE 88 112* 95 91 88  BUN 9 9 12 10 10   CREATININE 1.00 1.13 1.21 1.02 1.03  CALCIUM 8.5* 8.6* 8.8* 8.8* 8.8*    Liver  Function Tests:  Recent Labs Lab 03/14/16 0202  AST 15  ALT 13*  ALKPHOS 79  BILITOT 0.6  PROT 6.5  ALBUMIN 3.6   No results for input(s): LIPASE, AMYLASE in the last 168 hours. No results for input(s): AMMONIA in the last 168 hours.  CBC:  Recent Labs Lab 03/12/16 0213 03/13/16 0135 03/14/16 0202 03/15/16 0353 03/16/16 0209  WBC 7.5 8.4 8.9 7.9 7.9  HGB 13.4 13.3 13.1 12.8* 12.4*  HCT 40.2 40.0 40.0 38.5* 37.8*  MCV 85.4 85.1 86.4 86.3 86.5  PLT 161 148* 149* 145* 137*    INR:  Recent Labs Lab 03/12/16 0213 03/13/16 0135 03/14/16 0202 03/15/16 0400 03/16/16 0209  INR 1.22 1.22 1.21 1.22 1.29    Other results:    Imaging: No results found.   Medications:     Scheduled Medications: . acetaminophen  1,000 mg Oral Q6H   Or  . acetaminophen (TYLENOL) oral liquid 160 mg/5 mL  1,000 mg Oral Q6H  . allopurinol  300 mg Oral Daily  . aspirin EC  325 mg Oral Daily  . bisacodyl  10 mg Oral Daily  . ceFEPime (MAXIPIME) IV  1 g  Intravenous Q8H  . hydrALAZINE  37.5 mg Oral BID  . Influenza vac split quadrivalent PF  0.5 mL Intramuscular Tomorrow-1000  . mexiletine  150 mg Oral BID  . multivitamin with minerals  1 tablet Oral Daily  . pantoprazole  40 mg Oral Daily  . pneumococcal 23 valent vaccine  0.5 mL Intramuscular Tomorrow-1000  . sacubitril-valsartan  1 tablet Oral BID  . senna-docusate  1 tablet Oral QHS  . silver nitrate applicators  1 Stick Topical Once  . warfarin  7.5 mg Oral q1800  . Warfarin - Physician Dosing Inpatient   Does not apply q1800    Infusions: . dextrose 5 % and 0.45% NaCl    . heparin 800 Units/hr (03/16/16 0759)    PRN Medications: fentaNYL (SUBLIMAZE) injection, hydrALAZINE, ondansetron (ZOFRAN) IV, oxyCODONE, silver nitrate applicators, traMADol   Assessment/Plan    1) Driveline infection, recurrent  - S/p wound debridement and I&D 12/8. Wound vac in place - Dressing CDI.  - Wound cultures- Serratia again. On  cefepime.   - Discussed with [facility] regarding possible Ia time due to recurrent Driveline infection but in order to prioritize would require culture data and evidence of deep infection (need for debridement, etc).  2) Chronic systolic HF, s/p HMII LVAD implant 05/2012.  - NYHA I-II. Stable at this time. - No longer listed due to cirrhosis.   - Intolerant to even low dose BB. - Continue current meds. Can use lasix as needed.  3) Sore throat - Has some trauma from ETT. Improving 4) HTN-  -Maps improved. Continue entresto 97/103 twice a day. Continue hydralazine to 37.5 mg twice a day.  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 - ICD now at EOL. We have decided to not replace given that risk of infection felt to be higher than benefit or replacing as VT usually well tolerated in setting of VAD support 6) PVCs - Much improved with treatment of hyperthyroidism.  7) Anticoagulation with coumadin - ASA 325 mg for LVAD. - Continue heparin. 8) LVAD  - Stable on 9400. - INR 1.29 today. Continue heparin/coumadin 9) Hyperthyroidism - Amio induced. Follows with Dr. [place]lyn [place]. Now off amio.  - He is now off methimazole.   I reviewed the LVAD parameters from today, and compared the results to the patient's prior recorded data.  No programming changes were made.  The LVAD is functioning within specified parameters.  The patient performs LVAD self-test daily.  LVAD interrogation was negative for any significant power changes, alarms or PI events/speed drops.  LVAD equipment check completed and is in good working order.  Back-up equipment present.   LVAD education done on emergency procedures and precautions and reviewed exit site care.  Length of Stay: 7  Tonye BecketAmy Clegg, NP 03/16/2016, 8:11 AM  VAD Team --- VAD ISSUES ONLY--- Pager [phone] (7am - 7am)  Advanced Heart Failure Team Pager [phone] (M-F; 7a - 4p)  Please contact [facility] for night-coverage after hours (4p -7a ) and weekends on  [email or web address]  Patient seen and examined with Tonye BecketAmy Clegg, NP. We discussed all aspects of the encounter. I agree with the assessment and plan as stated above.   Wound site cauterized again last night. Bleeding seems to have stopped. Heparin back on. INR 1.29. HF stable. VAD parameters stable. Possible wound vac placement tomorrow. D/w Dr. Donata ClayVan Trigt.   Lizzet Hendley,MD 4:33 PM

## 2016-03-16 NOTE — Progress Notes (Signed)
ANTICOAGULATION CONSULT NOTE  Pharmacy Consult for heparin Indication: LVAD  Allergies  Allergen Reactions  . Metoprolol Other (See Comments)    Passes out  . Imdur [Isosorbide Nitrate] Other (See Comments)    HEADACHE--REACTION IS SIDE EFFECT OF NITRATES    Patient Measurements: Height: 6\' 2"  (188 cm) Weight: 248 lb 6.4 oz (112.7 kg) IBW/kg (Calculated) : 82.2 Heparin Dosing Weight: 106  Vital Signs: Temp: 97.6 F (36.4 C) (12/12 1112) Temp Source: Oral (12/12 1112) BP: 101/87 (12/12 1225) Pulse Rate: 80 (12/12 1112)  Labs:  Recent Labs  03/14/16 0202 03/15/16 0353 03/15/16 0400 03/16/16 0209  HGB 13.1 12.8*  --  12.4*  HCT 40.0 38.5*  --  37.8*  PLT 149* 145*  --  137*  LABPROT 15.4*  --  15.4* 16.2*  INR 1.21  --  1.22 1.29  HEPARINUNFRC 0.17*  --  0.13* <0.10*  CREATININE 1.21 1.02  --  1.03    Estimated Creatinine Clearance: 101.8 mL/min (by C-G formula based on SCr of 1.03 mg/dL).  Assessment: 60 y.o. male with LVAD s/p 2014 on chronic warfarin therapy. PTA warfarin is 7.5 mg daily.   Dr.Van Maudie Flakes has been managing anticoagulation issues. Situation very complicated as INR unchanged at 1.2 on home dose of warfarin for the past two days, will continue to follow MD dosing.  Significant bleeding noted yesterday requiring multiple dressing changes and surgical attention. Heparin level undetectable this morning on 800 units/hr. Bleeding improved this morning and heparin was increased to 1000 units/hr by surgery. D/w warfarin dosing with MD and will give 10mg  tonight to help achieve therapeutic INR (slight bump to 1.29)  Goal of Therapy:  Heparin level  Goal 0.3 per MD INR goal 2-2.5 Monitor platelets by anticoagulation protocol: Yes   Plan:  Continue heparin IV at 1000 units/hr Warfarin 10 mg tonight Daily heparin level and CBC Monitor for additional bleeding  Sheppard Coil PharmD., BCPS Clinical Pharmacist Pager [phone] 03/16/2016 1:55 PM

## 2016-03-16 NOTE — Progress Notes (Signed)
Peripherally Inserted Central Catheter/Midline Placement  The IV Nurse has discussed with the patient and/or persons authorized to consent for the patient, the purpose of this procedure and the potential benefits and risks involved with this procedure.  The benefits include less needle sticks, lab draws from the catheter, and the patient may be discharged home with the catheter. Risks include, but not limited to, infection, bleeding, blood clot (thrombus formation), and puncture of an artery; nerve damage and irregular heartbeat and possibility to perform a PICC exchange if needed/ordered by physician.  Alternatives to this procedure were also discussed.  Bard Power PICC patient education guide, fact sheet on infection prevention and patient information card has been provided to patient /or left at bedside.    PICC/Midline Placement Documentation        Lisabeth Devoid 03/16/2016, 9:28 AM

## 2016-03-17 DIAGNOSIS — L089 Local infection of the skin and subcutaneous tissue, unspecified: Secondary | ICD-10-CM

## 2016-03-17 DIAGNOSIS — Z95811 Presence of heart assist device: Secondary | ICD-10-CM

## 2016-03-17 LAB — CBC
HCT: 39.7 % (ref 39.0–52.0)
Hemoglobin: 12.7 g/dL — ABNORMAL LOW (ref 13.0–17.0)
MCH: 27.9 pg (ref 26.0–34.0)
MCHC: 32 g/dL (ref 30.0–36.0)
MCV: 87.3 fL (ref 78.0–100.0)
Platelets: 138 10*3/uL — ABNORMAL LOW (ref 150–400)
RBC: 4.55 MIL/uL (ref 4.22–5.81)
RDW: 15.8 % — ABNORMAL HIGH (ref 11.5–15.5)
WBC: 8.1 10*3/uL (ref 4.0–10.5)

## 2016-03-17 LAB — AEROBIC/ANAEROBIC CULTURE W GRAM STAIN (SURGICAL/DEEP WOUND)

## 2016-03-17 LAB — LACTATE DEHYDROGENASE: LDH: 168 U/L (ref 98–192)

## 2016-03-17 LAB — ANAEROBIC CULTURE

## 2016-03-17 LAB — BASIC METABOLIC PANEL
ANION GAP: 8 (ref 5–15)
BUN: 12 mg/dL (ref 6–20)
CHLORIDE: 105 mmol/L (ref 101–111)
CO2: 25 mmol/L (ref 22–32)
Calcium: 8.9 mg/dL (ref 8.9–10.3)
Creatinine, Ser: 0.95 mg/dL (ref 0.61–1.24)
GFR calc Af Amer: >60 mL/min (ref >60)
Glucose, Bld: 93 mg/dL (ref 65–99)
POTASSIUM: 4 mmol/L (ref 3.5–5.1)
SODIUM: 138 mmol/L (ref 135–145)

## 2016-03-17 LAB — HEPARIN LEVEL (UNFRACTIONATED): HEPARIN UNFRACTIONATED: 0.17 [IU]/mL — AB (ref 0.30–0.70)

## 2016-03-17 LAB — PROTIME-INR
INR: 1.36
Prothrombin Time: 16.9 seconds — ABNORMAL HIGH (ref 11.4–15.2)

## 2016-03-17 MED ORDER — WARFARIN SODIUM 10 MG PO TABS
10.0000 mg | ORAL_TABLET | Freq: Once | ORAL | Status: AC
  Administered 2016-03-17: 10 mg via ORAL
  Filled 2016-03-17: qty 1

## 2016-03-17 MED ORDER — HEPARIN (PORCINE) IN NACL 100-0.45 UNIT/ML-% IJ SOLN
1000.0000 [IU]/h | INTRAMUSCULAR | Status: DC
  Administered 2016-03-17 – 2016-03-19 (×4): 1000 [IU]/h via INTRAVENOUS
  Filled 2016-03-17 (×3): qty 250

## 2016-03-17 MED ORDER — HYDRALAZINE HCL 50 MG PO TABS
50.0000 mg | ORAL_TABLET | Freq: Two times a day (BID) | ORAL | Status: DC
  Administered 2016-03-17 – 2016-03-20 (×7): 50 mg via ORAL
  Filled 2016-03-17 (×7): qty 1

## 2016-03-17 NOTE — Progress Notes (Signed)
VAD coordinator called and notified that patient's Doppler MAP is 110 and Automatic is 100. No new orders given at this time.

## 2016-03-17 NOTE — Progress Notes (Signed)
  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   Drive Line Infeciton  9/92/42 >> Serratia marcescens (rare) >> Cipro 500 mg bid x 7 days  09/29/15 >> Serratia marcescens (abundant) >> Cipro 500 mg BID x14d 11/20/15 >> Serratia marcescens (few) Admitted IV ceftriaxone >> cipro 500 mg twice daily x 30 days 02/03/16 >> Serratia species >> Cipro 500 mg BID x 14d; referral to ID   Admitted to [facility] on 12/5 for heparin bridge and surgical debridement of persistent drive line infection.   03/12/2016 Surgical Debridement with VAC placement. Full thickness 5x8x6 cm. 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 Placed on cefepime 1 gram IV every 8 hours  03/16/2016 PICC line placed for IV antibiotics.  03/17/2016 VAC reapplied with 1 gram of A Cell applied.     Mercy Leppla NP-C  Advanced Heart Failure/LVAD Team [phone] 3:44 PM

## 2016-03-17 NOTE — Consult Note (Addendum)
WOC Nurse wound follow up Wound type: Consult was previously requested for Vac dressing change on Monday whichwas applied Fri in the OR. Vac was discontinued this weekend related to bleeding, according to the EMR.  Called Tonye Becket, NP to discuss plan of care this am.  CT team plans to apply A Cell and a Vac dressing to the site today to the full thickness post-op wound over LVAD site.   Please re-consult WOC team if  if Vac dressing change assistance is desired. Thank-you,  Cammie Mcgee MSN, RN, CWOCN, [place], CNS [phone]

## 2016-03-17 NOTE — Progress Notes (Signed)
Advanced Heart Failure VAD Team Note  Subjective:    Admitted 03/09/16 for heparin bridge for Driveline debridement   Underwent debridement of driveline wound with placement of wound vac12/8.   Bleeding from drive line wound x2. Heparin held for a couple of hours INR 1.36   Denies pain/SOB.   LVAD INTERROGATION:  HeartMate II LVAD:  Flow 4.7  liters/min, speed 9400, power 6, PI 7.5 Objective:    Vital Signs:   Temp:  [97.4 F (36.3 C)-98.2 F (36.8 C)] 97.4 F (36.3 C) (12/13 0732) Pulse Rate:  [68-85] 85 (12/13 0732) Resp:  [13-30] 13 (12/13 0800) BP: (101-118)/(85-101) 109/98 (12/13 0732) SpO2:  [94 %-100 %] 99 % (12/13 0732) Weight:  [110.8 kg (244 lb 4.8 oz)] 110.8 kg (244 lb 4.8 oz) (12/13 0400) Last BM Date: 03/16/16   Mean arterial Pressure  90-100s  Intake/Output:   Intake/Output Summary (Last 24 hours) at 03/17/16 1010 Last data filed at 03/17/16 0800  Gross per 24 hour  Intake              580 ml  Output                0 ml  Net              580 ml     Physical Exam: GENERAL: NAD. In bed HEENT: Normal  NECK: Supple, JVP ~7; 2+ bilaterally, no bruits. No thyromegaly or nodule noted.  CARDIAC: Mechanical heart sounds with LVAD hum present.  LUNGS: Clear, normal effort. ABDOMEN: Soft, round, NT, ND, no HSM. No bruits or masses. +BS  LVAD exit site:  Drive line dressing CDI  EXTREMITIES: Warm and dry, no cyanosis, clubbing, rash. No peripheral edema. NEUROLOGIC: Alert and oriented x 4. Gait steady. No aphasia. No dysarthria.Affect pleasant.   Telemetry: NSR  Labs: Basic Metabolic Panel:  Recent Labs Lab 03/13/16 0135 03/14/16 0202 03/15/16 0353 03/16/16 0209 03/17/16 0420  NA 134* 135 136 137 138  K 3.7 3.9 4.0 4.3 4.0  CL 101 102 105 105 105  CO2 26 22 24 25 25   GLUCOSE 112* 95 91 88 93  BUN 9 12 10 10 12   CREATININE 1.13 1.21 1.02 1.03 0.95  CALCIUM 8.6* 8.8* 8.8* 8.8* 8.9    Liver Function Tests:  Recent Labs Lab  03/14/16 0202  AST 15  ALT 13*  ALKPHOS 79  BILITOT 0.6  PROT 6.5  ALBUMIN 3.6   No results for input(s): LIPASE, AMYLASE in the last 168 hours. No results for input(s): AMMONIA in the last 168 hours.  CBC:  Recent Labs Lab 03/13/16 0135 03/14/16 0202 03/15/16 0353 03/16/16 0209 03/17/16 0420  WBC 8.4 8.9 7.9 7.9 8.1  HGB 13.3 13.1 12.8* 12.4* 12.7*  HCT 40.0 40.0 38.5* 37.8* 39.7  MCV 85.1 86.4 86.3 86.5 87.3  PLT 148* 149* 145* 137* 138*    INR:  Recent Labs Lab 03/13/16 0135 03/14/16 0202 03/15/16 0400 03/16/16 0209 03/17/16 0420  INR 1.22 1.21 1.22 1.29 1.36    Other results:    Imaging: No results found.   Medications:     Scheduled Medications: . acetaminophen  1,000 mg Oral Q6H   Or  . acetaminophen (TYLENOL) oral liquid 160 mg/5 mL  1,000 mg Oral Q6H  . allopurinol  300 mg Oral Daily  . aspirin EC  325 mg Oral Daily  . bisacodyl  10 mg Oral Daily  . ceFEPime (MAXIPIME) IV  1 g Intravenous Q8H  .  hydrALAZINE  50 mg Oral BID  . Influenza vac split quadrivalent PF  0.5 mL Intramuscular Tomorrow-1000  . mexiletine  150 mg Oral BID  . multivitamin with minerals  1 tablet Oral Daily  . pantoprazole  40 mg Oral Daily  . pneumococcal 23 valent vaccine  0.5 mL Intramuscular Tomorrow-1000  . sacubitril-valsartan  1 tablet Oral BID  . senna-docusate  1 tablet Oral QHS  . silver nitrate applicators  1 Stick Topical Once  . sodium chloride flush  10-40 mL Intracatheter Q12H  . warfarin  10 mg Oral ONCE-1800  . Warfarin - Physician Dosing Inpatient   Does not apply q1800    Infusions: . dextrose 5 % and 0.45% NaCl    . heparin Stopped (03/17/16 0952)    PRN Medications: fentaNYL (SUBLIMAZE) injection, hydrALAZINE, ondansetron (ZOFRAN) IV, oxyCODONE, silver nitrate applicators, sodium chloride flush, traMADol   Assessment/Plan    1) Driveline infection, recurrent  - S/p wound debridement and I&D 12/8. Wound vac in place - Dressing CDI.    - Wound cultures- Serratia again. On cefepime.   - Discussed with [facility] regarding possible Ia time due to recurrent Driveline infection but in order to prioritize would require culture data and evidence of deep infection (need for debridement, etc).  2) Chronic systolic HF, s/p HMII LVAD implant 05/2012.  - NYHA I-II. Stable at this time. - No longer listed due to cirrhosis.   - Intolerant to even low dose BB. - Continue current meds. Can use lasix as needed.  3) Sore throat - Has some trauma from ETT. Improving 4) HTN-  -Maps elevated. Continue entresto 97/103 twice a day. Increase  hydralazine to 50 mg twice a day. May need to add amlodipine tomorrow.  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 - ICD now at EOL. We have decided to not replace given that risk of infection felt to be higher than benefit or replacing as VT usually well tolerated in setting of VAD support 6) PVCs - Much improved with treatment of hyperthyroidism.  7) Anticoagulation with coumadin - ASA 325 mg for LVAD.  - Continue heparin until INR >1.8. Todays INR 1.36.  8) LVAD  - Stable on 9400. - INR 1.36 today. Continue heparin/coumadin 9) Hyperthyroidism - Amio induced. Follows with Dr. [place]. Now off amio.  - He is now off methimazole.   I reviewed the LVAD parameters from today, and compared the results to the patient's prior recorded data.  No programming changes were made.  The LVAD is functioning within specified parameters.  The patient performs LVAD self-test daily.  LVAD interrogation was negative for any significant power changes, alarms or PI events/speed drops.  LVAD equipment check completed and is in good working order.  Back-up equipment present.   LVAD education done on emergency procedures and precautions and reviewed exit site care.  Length of Stay: 8  Arvilla MeresBensimhon, Stevenson Windmiller, MD 03/17/2016, 10:10 AM  VAD Team --- VAD ISSUES ONLY--- Pager [phone] (7am - 7am)  Advanced Heart Failure Team Pager  [phone] (M-F; 7a - 4p)  Please contact [facility] for night-coverage after hours (4p -7a ) and weekends on [email or web address]   Patient seen and examined with Tonye BecketAmy Clegg, NP. We discussed all aspects of the encounter. I agree with the assessment and plan as stated above.   He is doing well. Walking the hall. Wound site looks good. Will have vac placed later today. INR 1.36. Continue heparin coumadin. VAD parameters stable.   Macaiah Mangal,MD 10:11  AM

## 2016-03-17 NOTE — Op Note (Signed)
Procedure-application of wound VAC with topical A-cell powder  Surgeon Kathlee Nations trigt M.D.  Anesthesia none  Postop diagnosis status post debridement of HeartMate 2 drive line exit site with soft tissue defect  Procedure  The procedure was discussed with the patient and informed consent obtained. The previous dressing and packing were removed and the skin was prepped with CHG and draped as a sterile field. 1 g of a cell powder was applied to the deep granulating surface of the wound. There is clean granulation and no debridement was needed. No active bleeding This was covered with a piece of sorbact cut  to the appropriate size and configuration On top of the sorbact a small VAC sponge was placed after it was cut to the appropriate size and configuration. Over the sponge a VAC sterile sheet was applied in double layer. The sheet was cut over the sponge and a circular pattern and the suction connector and tubing were applied. The VAC was activated at -75 mmHg suction and worked well. No complications.

## 2016-03-17 NOTE — Progress Notes (Signed)
Saw that pt will be placed back on wound vac. Have placed vac form on shadow chart for md to sign. Will cont to follow.

## 2016-03-17 NOTE — Progress Notes (Signed)
Verbal orders given by Dr. Donata Clay to pause heparin gtt during drive line dressing change procedure to place wound vac. Instructed to resume gtt at 1200.

## 2016-03-17 NOTE — Progress Notes (Signed)
Advanced Heart Failure VAD Team Note  Subjective:    Admitted 03/09/16 for heparin bridge for Driveline debridement   Underwent debridement of driveline wound with placement of wound vac12/8.   Bleeding from drive line wound x2. Heparin held for a couple of hours INR 1.36   Denies pain/SOB.   LVAD INTERROGATION:  HeartMate II LVAD:  Flow 4.7  liters/min, speed 9400, power 6, PI 7.5 Objective:    Vital Signs:   Temp:  [97.4 F (36.3 C)-98.2 F (36.8 C)] 97.4 F (36.3 C) (12/13 0732) Pulse Rate:  [68-85] 85 (12/13 0732) Resp:  [13-30] 21 (12/13 0732) BP: (101-118)/(85-101) 109/98 (12/13 0732) SpO2:  [94 %-100 %] 99 % (12/13 0732) Weight:  [244 lb 4.8 oz (110.8 kg)] 244 lb 4.8 oz (110.8 kg) (12/13 0400) Last BM Date: 03/16/16   Mean arterial Pressure  90-100s  Intake/Output:   Intake/Output Summary (Last 24 hours) at 03/17/16 0800 Last data filed at 03/17/16 0732  Gross per 24 hour  Intake            592.6 ml  Output                0 ml  Net            592.6 ml     Physical Exam: GENERAL: NAD. In bed HEENT: Normal  NECK: Supple, JVP ~7; 2+ bilaterally, no bruits. No thyromegaly or nodule noted.  CARDIAC: Mechanical heart sounds with LVAD hum present.  LUNGS: Clear, normal effort. ABDOMEN: Soft, round, NT, ND, no HSM. No bruits or masses. +BS  LVAD exit site:  Drive line dressing CDI  EXTREMITIES: Warm and dry, no cyanosis, clubbing, rash. No peripheral edema. NEUROLOGIC: Alert and oriented x 4. Gait steady. No aphasia. No dysarthria.Affect pleasant.   Telemetry: NSR  Labs: Basic Metabolic Panel:  Recent Labs Lab 03/13/16 0135 03/14/16 0202 03/15/16 0353 03/16/16 0209 03/17/16 0420  NA 134* 135 136 137 138  K 3.7 3.9 4.0 4.3 4.0  CL 101 102 105 105 105  CO2 26 22 24 25 25   GLUCOSE 112* 95 91 88 93  BUN 9 12 10 10 12   CREATININE 1.13 1.21 1.02 1.03 0.95  CALCIUM 8.6* 8.8* 8.8* 8.8* 8.9    Liver Function Tests:  Recent Labs Lab  03/14/16 0202  AST 15  ALT 13*  ALKPHOS 79  BILITOT 0.6  PROT 6.5  ALBUMIN 3.6   No results for input(s): LIPASE, AMYLASE in the last 168 hours. No results for input(s): AMMONIA in the last 168 hours.  CBC:  Recent Labs Lab 03/13/16 0135 03/14/16 0202 03/15/16 0353 03/16/16 0209 03/17/16 0420  WBC 8.4 8.9 7.9 7.9 8.1  HGB 13.3 13.1 12.8* 12.4* 12.7*  HCT 40.0 40.0 38.5* 37.8* 39.7  MCV 85.1 86.4 86.3 86.5 87.3  PLT 148* 149* 145* 137* 138*    INR:  Recent Labs Lab 03/13/16 0135 03/14/16 0202 03/15/16 0400 03/16/16 0209 03/17/16 0420  INR 1.22 1.21 1.22 1.29 1.36    Other results:    Imaging: No results found.   Medications:     Scheduled Medications: . acetaminophen  1,000 mg Oral Q6H   Or  . acetaminophen (TYLENOL) oral liquid 160 mg/5 mL  1,000 mg Oral Q6H  . allopurinol  300 mg Oral Daily  . aspirin EC  325 mg Oral Daily  . bisacodyl  10 mg Oral Daily  . ceFEPime (MAXIPIME) IV  1 g Intravenous Q8H  . hydrALAZINE  50  mg Oral BID  . Influenza vac split quadrivalent PF  0.5 mL Intramuscular Tomorrow-1000  . mexiletine  150 mg Oral BID  . multivitamin with minerals  1 tablet Oral Daily  . pantoprazole  40 mg Oral Daily  . pneumococcal 23 valent vaccine  0.5 mL Intramuscular Tomorrow-1000  . sacubitril-valsartan  1 tablet Oral BID  . senna-docusate  1 tablet Oral QHS  . silver nitrate applicators  1 Stick Topical Once  . sodium chloride flush  10-40 mL Intracatheter Q12H  . Warfarin - Physician Dosing Inpatient   Does not apply q1800    Infusions: . dextrose 5 % and 0.45% NaCl    . heparin 1,000 Units/hr (03/17/16 0732)    PRN Medications: fentaNYL (SUBLIMAZE) injection, hydrALAZINE, ondansetron (ZOFRAN) IV, oxyCODONE, silver nitrate applicators, sodium chloride flush, traMADol   Assessment/Plan    1) Driveline infection, recurrent  - S/p wound debridement and I&D 12/8. Wound vac in place - Dressing CDI.  - Wound cultures- Serratia  again. On cefepime.   - Discussed with [facility] regarding possible Ia time due to recurrent Driveline infection but in order to prioritize would require culture data and evidence of deep infection (need for debridement, etc).  2) Chronic systolic HF, s/p HMII LVAD implant 05/2012.  - NYHA I-II. Stable at this time. - No longer listed due to cirrhosis.   - Intolerant to even low dose BB. - Continue current meds. Can use lasix as needed.  3) Sore throat - Has some trauma from ETT. Improving 4) HTN-  -Maps elevated. Continue entresto 97/103 twice a day. Increase  hydralazine to 50 mg twice a day. May need to add amlodipine tomorrow.  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 - ICD now at EOL. We have decided to not replace given that risk of infection felt to be higher than benefit or replacing as VT usually well tolerated in setting of VAD support 6) PVCs - Much improved with treatment of hyperthyroidism.  7) Anticoagulation with coumadin - ASA 325 mg for LVAD.  - Continue heparin until INR >1.8. Todays INR 1.36.  8) LVAD  - Stable on 9400. - INR 1.36 today. Continue heparin/coumadin 9) Hyperthyroidism - Amio induced. Follows with Dr. Evlyn Kanner. Now off amio.  - He is now off methimazole.   I reviewed the LVAD parameters from today, and compared the results to the patient's prior recorded data.  No programming changes were made.  The LVAD is functioning within specified parameters.  The patient performs LVAD self-test daily.  LVAD interrogation was negative for any significant power changes, alarms or PI events/speed drops.  LVAD equipment check completed and is in good working order.  Back-up equipment present.   LVAD education done on emergency procedures and precautions and reviewed exit site care.  Length of Stay: 8  Amy Clegg, NP 03/17/2016, 8:00 AM  VAD Team --- VAD ISSUES ONLY--- Pager [phone] (7am - 7am)  Advanced Heart Failure Team Pager [phone] (M-F; 7a - 4p)  Please contact  [facility] for night-coverage after hours (4p -7a ) and weekends on [email or web address]

## 2016-03-17 NOTE — Progress Notes (Signed)
ANTICOAGULATION CONSULT NOTE  Pharmacy Consult for heparin/coumadin Indication: LVAD  Allergies  Allergen Reactions  . Metoprolol Other (See Comments)    Passes out  . Imdur [Isosorbide Nitrate] Other (See Comments)    HEADACHE--REACTION IS SIDE EFFECT OF NITRATES    Patient Measurements: Height: 6\' 2"  (188 cm) Weight: 244 lb 4.8 oz (110.8 kg) (RN held tele box and LVAD controller) IBW/kg (Calculated) : 82.2 Heparin Dosing Weight: 106  Vital Signs: Temp: 97.4 F (36.3 C) (12/13 0732) Temp Source: Oral (12/13 0732) BP: 107/90 (12/13 0732) Pulse Rate: 85 (12/13 0732)  Labs:  Recent Labs  03/15/16 0353 03/15/16 0400 03/16/16 0209 03/17/16 0420  HGB 12.8*  --  12.4* 12.7*  HCT 38.5*  --  37.8* 39.7  PLT 145*  --  137* 138*  LABPROT  --  15.4* 16.2* 16.9*  INR  --  1.22 1.29 1.36  HEPARINUNFRC  --  0.13* <0.10* 0.17*  CREATININE 1.02  --  1.03 0.95    Estimated Creatinine Clearance: 109.5 mL/min (by C-G formula based on SCr of 0.95 mg/dL).  Assessment: 60 y.o. male with LVAD s/p 2014 on chronic warfarin therapy. PTA warfarin is 7.5 mg daily.   Dr.Van Maudie Flakes has been managing anticoagulation issues. Situation very complicated as INR unchanged at 1.2 on home dose of warfarin for the past two days, will continue to follow MD dosing.  Significant bleeding noted 12/11 requiring multiple dressing changes and surgical attention. Dressing site shows some oozing today, plan is to go back to OR for I&D and wound vac placement today.   HL low on 1000/hr, no changes expected.  INR up slightly to 1.36 after warfarin 10mg  last night, will repeat tonight.  Goal of Therapy:  Heparin level  Goal 0.3 per MD INR goal 2-2.5 Monitor platelets by anticoagulation protocol: Yes   Plan:  Continue heparin IV at 1000 units/hr Warfarin 10 mg again tonight Daily heparin level and CBC Follow up after OR/wound vac  Sheppard Coil PharmD., BCPS Clinical Pharmacist Pager  [phone] 03/17/2016 7:46 AM

## 2016-03-18 DIAGNOSIS — Z888 Allergy status to other drugs, medicaments and biological substances status: Secondary | ICD-10-CM

## 2016-03-18 DIAGNOSIS — Z95828 Presence of other vascular implants and grafts: Secondary | ICD-10-CM

## 2016-03-18 DIAGNOSIS — Y712 Prosthetic and other implants, materials and accessory cardiovascular devices associated with adverse incidents: Secondary | ICD-10-CM

## 2016-03-18 DIAGNOSIS — T827XXD Infection and inflammatory reaction due to other cardiac and vascular devices, implants and grafts, subsequent encounter: Secondary | ICD-10-CM

## 2016-03-18 DIAGNOSIS — B9689 Other specified bacterial agents as the cause of diseases classified elsewhere: Secondary | ICD-10-CM

## 2016-03-18 LAB — PROTIME-INR
INR: 1.47
Prothrombin Time: 18 seconds — ABNORMAL HIGH (ref 11.4–15.2)

## 2016-03-18 LAB — CBC
HCT: 39.5 % (ref 39.0–52.0)
Hemoglobin: 13.1 g/dL (ref 13.0–17.0)
MCH: 28.6 pg (ref 26.0–34.0)
MCHC: 33.2 g/dL (ref 30.0–36.0)
MCV: 86.2 fL (ref 78.0–100.0)
PLATELETS: 135 10*3/uL — AB (ref 150–400)
RBC: 4.58 MIL/uL (ref 4.22–5.81)
RDW: 15.8 % — AB (ref 11.5–15.5)
WBC: 7.3 10*3/uL (ref 4.0–10.5)

## 2016-03-18 LAB — HEPARIN LEVEL (UNFRACTIONATED): Heparin Unfractionated: 0.22 IU/mL — ABNORMAL LOW (ref 0.30–0.70)

## 2016-03-18 LAB — LACTATE DEHYDROGENASE: LDH: 171 U/L (ref 98–192)

## 2016-03-18 MED ORDER — CEFTRIAXONE SODIUM 2 G IJ SOLR
2.0000 g | Freq: Two times a day (BID) | INTRAMUSCULAR | Status: DC
  Administered 2016-03-18 – 2016-03-20 (×5): 2 g via INTRAVENOUS
  Filled 2016-03-18 (×5): qty 2

## 2016-03-18 MED ORDER — INFLUENZA VAC SPLIT QUAD 0.5 ML IM SUSY
0.5000 mL | PREFILLED_SYRINGE | Freq: Once | INTRAMUSCULAR | Status: AC
  Administered 2016-03-19: 0.5 mL via INTRAMUSCULAR
  Filled 2016-03-18: qty 0.5

## 2016-03-18 MED ORDER — WARFARIN SODIUM 2.5 MG PO TABS
12.5000 mg | ORAL_TABLET | Freq: Once | ORAL | Status: AC
  Administered 2016-03-18: 12.5 mg via ORAL
  Filled 2016-03-18: qty 1

## 2016-03-18 MED ORDER — WARFARIN - PHARMACIST DOSING INPATIENT: Freq: Every day | Status: DC

## 2016-03-18 MED ORDER — AMLODIPINE BESYLATE 2.5 MG PO TABS
2.5000 mg | ORAL_TABLET | Freq: Every day | ORAL | Status: DC
  Administered 2016-03-18 – 2016-03-20 (×3): 2.5 mg via ORAL
  Filled 2016-03-18 (×3): qty 1

## 2016-03-18 MED ORDER — DEXTROSE 5 % IV SOLN
2.0000 g | INTRAVENOUS | Status: DC
  Administered 2016-03-18: 2 g via INTRAVENOUS
  Filled 2016-03-18: qty 2

## 2016-03-18 NOTE — Progress Notes (Signed)
ANTICOAGULATION CONSULT NOTE  Pharmacy Consult for heparin/coumadin Indication: LVAD  Allergies  Allergen Reactions  . Metoprolol Other (See Comments)    Passes out  . Imdur [Isosorbide Nitrate] Other (See Comments)    HEADACHE--REACTION IS SIDE EFFECT OF NITRATES    Patient Measurements: Height: 6\' 2"  (188 cm) Weight: 244 lb 1.6 oz (110.7 kg) IBW/kg (Calculated) : 82.2 Heparin Dosing Weight: 106  Vital Signs: Temp: 97.7 F (36.5 C) (12/14 1100) Temp Source: Oral (12/14 1100) BP: 114/84 (12/14 0800)  Labs:  Recent Labs  03/16/16 0209 12/Robert/17 0420 03/18/16 0357  HGB 12.4* 12.7* Robert.1  HCT 37.8* 39.7 39.5  PLT 137* 138* 135*  LABPROT 16.2* 16.9* 18.0*  INR 1.29 1.36 1.47  HEPARINUNFRC <0.10* 0.17* 0.22*  CREATININE 1.03 0.95  --     Estimated Creatinine Clearance: 109.5 mL/min (by C-G formula based on SCr of 0.95 mg/dL).   Marland Kitchen dextrose 5 % and 0.45% NaCl    . heparin 1,000 Units/hr (12/Robert/17 2210)     Assessment: 60 y.o. [name] with LVAD s/p 2014 on chronic warfarin therapy. PTA warfarin is 7.5 mg daily.   Significant bleeding noted 12/11 requiring multiple dressing changes and surgical attention. Dressing site shows some oozing today, plan is to go back to OR for I&D and wound vac placement today.   Dr. Donata Clay managing IV heparin.  Heparin level slightly below stated goal at 0.22 at 1000 units/hr.  No bleeding noted currently.    INR up slightly to 1.47 after warfarin 10mg  last night, will increase tonight to 12.5 mg.  PTA Coumadin dose 7.5 mg daily.  Goal of Therapy:  Heparin level  Goal 0.3 per MD INR goal 2-2.5 Monitor platelets by anticoagulation protocol: Yes   Plan:  Continue heparin IV at 1000 units/hr Warfarin 12.5 mg x 1 tonight Daily heparin level, CBC and INR.  Tad Moore, BCPS  Clinical Pharmacist Pager [phone]  03/18/2016 1:10 PM

## 2016-03-18 NOTE — Progress Notes (Addendum)
Advanced Heart Failure VAD Team Note  Subjective:    Admitted 03/09/16 for heparin bridge for Driveline debridement   Underwent debridement of driveline wound with placement of wound vac12/8.   Yesterday hydralazine was increased to 50 mg twice a day.  VAC and Acell applied at the bed side by Dr Alla German.   Denies pain/SOB.   LVAD INTERROGATION:  HeartMate II LVAD:  Flow 4.4  liters/min, speed 9400, power 5, PI 7.1 Objective:    Vital Signs:   Temp:  [97.8 F (36.6 C)-98.3 F (36.8 C)] 97.9 F (36.6 C) (12/14 0728) Pulse Rate:  [68-80] 80 (12/13 1635) Resp:  [15-31] 18 (12/14 0728) BP: (98-119)/(84-94) 114/84 (12/14 0800) SpO2:  [95 %-98 %] 97 % (12/14 0728) Weight:  [244 lb 1.6 oz (110.7 kg)] 244 lb 1.6 oz (110.7 kg) (12/14 0400) Last BM Date: 03/17/16   Mean arterial Pressure  90-100s  Intake/Output:   Intake/Output Summary (Last 24 hours) at 03/18/16 0911 Last data filed at 03/18/16 0800  Gross per 24 hour  Intake             1110 ml  Output              900 ml  Net              210 ml     Physical Exam: GENERAL: NAD. Sitting on the side of the bed.  HEENT: Normal  NECK: Supple, JVP ~7; 2+ bilaterally, no bruits. No thyromegaly or nodule noted.  CARDIAC: Mechanical heart sounds with LVAD hum present.  LUNGS: Clear, normal effort. ABDOMEN: Soft, round, NT, ND, no HSM. No bruits or masses. +BS  LVAD exit site:  Drive line dressing CDI  EXTREMITIES: Warm and dry, no cyanosis, clubbing, rash. No peripheral edema. NEUROLOGIC: Alert and oriented x 4. Gait steady. No aphasia. No dysarthria.Affect pleasant.   Telemetry: NST  Labs: Basic Metabolic Panel:  Recent Labs Lab 03/13/16 0135 03/14/16 0202 03/15/16 0353 03/16/16 0209 03/17/16 0420  NA 134* 135 136 137 138  K 3.7 3.9 4.0 4.3 4.0  CL 101 102 105 105 105  CO2 26 22 24 25 25   GLUCOSE 112* 95 91 88 93  BUN 9 12 10 10 12   CREATININE 1.13 1.21 1.02 1.03 0.95  CALCIUM 8.6* 8.8* 8.8*  8.8* 8.9    Liver Function Tests:  Recent Labs Lab 03/14/16 0202  AST 15  ALT 13*  ALKPHOS 79  BILITOT 0.6  PROT 6.5  ALBUMIN 3.6   No results for input(s): LIPASE, AMYLASE in the last 168 hours. No results for input(s): AMMONIA in the last 168 hours.  CBC:  Recent Labs Lab 03/14/16 0202 03/15/16 0353 03/16/16 0209 03/17/16 0420 03/18/16 0357  WBC 8.9 7.9 7.9 8.1 7.3  HGB 13.1 12.8* 12.4* 12.7* 13.1  HCT 40.0 38.5* 37.8* 39.7 39.5  MCV 86.4 86.3 86.5 87.3 86.2  PLT 149* 145* 137* 138* 135*    INR:  Recent Labs Lab 03/14/16 0202 03/15/16 0400 03/16/16 0209 03/17/16 0420 03/18/16 0357  INR 1.21 1.22 1.29 1.36 1.47    Other results:    Imaging: No results found.   Medications:     Scheduled Medications: . allopurinol  300 mg Oral Daily  . aspirin EC  325 mg Oral Daily  . bisacodyl  10 mg Oral Daily  . ceFEPime (MAXIPIME) IV  1 g Intravenous Q8H  . hydrALAZINE  50 mg Oral BID  . Influenza vac split quadrivalent PF  0.5 mL Intramuscular Tomorrow-1000  . mexiletine  150 mg Oral BID  . multivitamin with minerals  1 tablet Oral Daily  . pantoprazole  40 mg Oral Daily  . pneumococcal 23 valent vaccine  0.5 mL Intramuscular Tomorrow-1000  . sacubitril-valsartan  1 tablet Oral BID  . senna-docusate  1 tablet Oral QHS  . silver nitrate applicators  1 Stick Topical Once  . sodium chloride flush  10-40 mL Intracatheter Q12H    Infusions: . dextrose 5 % and 0.45% NaCl    . heparin 1,000 Units/hr (03/17/16 2210)    PRN Medications: fentaNYL (SUBLIMAZE) injection, hydrALAZINE, ondansetron (ZOFRAN) IV, oxyCODONE, silver nitrate applicators, sodium chloride flush, traMADol   Assessment/Plan    1) Driveline infection, recurrent  - S/p wound debridement and I&D 12/8. Wound vac in place - Dressing CDI. VAC in place no dressing until next week.  - Wound cultures- Serratia again. On cefepime. Consult ID.     - Discussed with [facility] regarding possible Ia  time due to recurrent Driveline infection but in order to prioritize would require culture data and evidence of deep infection (need for debridement, etc).  2) Chronic systolic HF, s/p HMII LVAD implant 05/2012.  - NYHA I-II. Stable at this time. - No longer listed due to cirrhosis.   - Intolerant to even low dose BB. - Continue current meds. Can use lasix as needed.  3) Sore throat - Has some trauma from ETT. Improving 4) HTN-  -Maps elevated. Continue entresto 97/103 twice a day. Continue hydralazine to 50 mg twice a day. Add 2.5 mg amlodipine daily.   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 - ICD now at EOL. We have decided to not replace given that risk of infection felt to be higher than benefit or replacing as VT usually well tolerated in setting of VAD support 6) PVCs - Much improved with treatment of hyperthyroidism.  7) Anticoagulation with coumadin - ASA 325 mg for LVAD.  - Continue heparin until INR >1.8. Todays INR 1.47.  8) LVAD  - Stable on 9400. - INR 1.47 today. Continue heparin/coumadin 9) Hyperthyroidism - Amio induced. Follows with Dr. [place]. Now off amio.  - He is now off methimazole.     I reviewed the LVAD parameters from today, and compared the results to the patient's prior recorded data.  No programming changes were made.  The LVAD is functioning within specified parameters.  The patient performs LVAD self-test daily.  LVAD interrogation was negative for any significant power changes, alarms or PI events/speed drops.  LVAD equipment check completed and is in good working order.  Back-up equipment present.   LVAD education done on emergency procedures and precautions and reviewed exit site care.  Length of Stay: 9  Tonye BecketAmy Clegg, NP 03/18/2016, 9:11 AM  VAD Team --- VAD ISSUES ONLY--- Pager [phone] (7am - 7am)  Advanced Heart Failure Team Pager [phone] (M-F; 7a - 4p)  Please contact [facility] for night-coverage after hours (4p -7a ) and weekends on  [email or web address]  Patient seen and examined with Tonye BecketAmy Clegg, NP. We discussed all aspects of the encounter. I agree with the assessment and plan as stated above.   Doing well. Wound vac now in. HF stable. INR coming up. Will consult ID for home abx recommendations. Have discussed with [facility] team about bumping to Ia status. VAD parameters ok .  Zephan Beauchaine,MD 9:51 AM

## 2016-03-18 NOTE — Consult Note (Signed)
[facility] Infectious [facility]  Date of Admission:  03/09/2016  Date of Consult:  03/18/2016  Reason for Consult: Serratia marcescens LVAD line infection Referring Physician: Dr. Haroldine Laws  Impression/Recommendation 1.  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  These cultures showing resistance to Cefazolin and intermediate resistance to Cipro. Other recent culture from 11/22 shows resistance to Augmentin and Cefazolin with additional intermediate resistance to fluoroquinolones.  Has been on Cefepime since 12/7 this hospitalization.  - Current and past cultures show susceptibility to Ceftriaxone   - Will switch to Ceftriaxone and stop Cefepime - will increase dose to 2g q12h - Have discussed with pharm re: rifampin.  Micro lab unable to confirm susceptibility to Rifampin. Will hold for now.  - Will discuss with Dr. Johnnye Sima further regarding duration of treatment, further suppressive antimicrobial therapy   Thank you so much for this interesting consult,   Jule Ser (pager) [phone] [email or web address]  Robert [name] is an 60 y.o. male.  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  Robert [name] reports persistent infection for many months with continued exudative drainage from drive line site despite multiple rounds of oral antibiotics with Cipro (08/2015, 09/2015, 11/2015, and 01/2016).  He was also hospitalized in August 2017 for IV ceftriaxone followed by Cipro 57m BID x 30 days.  States that while on antibiotics, his drainage would improve but then would recur after completion of antibiotic course.  Never  completely resolved.  He most recently underwent surgical debridement with VAC placement and wound irrigation with antibiotic solution with Dr. VPrescott Gumon 12/8.  Deep cultures from this revealed Serratia marcescens again with resistance to Cefazolin and intermediate resistance to Cipro.  He has been on Cefepime since 12/7.  Experienced some post-op bleeding and wound vac removed.  This was replaced on 12/13 with topical A-cell powder.  He has been afebrile without leukocytosis.  Reports occasional chills at night but nothing remarkable.  No nausea, vomiting, diarrhea, dysuria, abdominal pain.  Reports mild constipation during hospital stay relieved by laxative and eating plenty of grapes.  Past Medical History:  Diagnosis Date  . AICD (automatic cardioverter/defibrillator) present 2011  . Bronchitis   . Congestive heart failure ([facility] 2011  . Hypertension 2000  . LVAD (left ventricular assist device) present ([facility] 2014  . Pneumonia   . Pulmonary hypertension     Past Surgical History:  Procedure Laterality Date  . APPLICATION OF WOUND VAC N/A 03/12/2016   Procedure: APPLICATION OF WOUND VAC;  Surgeon: PIvin Poot MD;  [facility]  Service: Thoracic;  Laterality: N/A;  . CARDIAC CATHETERIZATION    . CARDIAC CATHETERIZATION N/A 11/04/2014   Procedure: Right Heart Cath;  Surgeon: DJolaine Artist MD;  [facility];  Service: Cardiovascular;  Laterality: N/A;  . CARDIAC CATHETERIZATION N/A 11/06/2015   Procedure: Right Heart Cath;  Surgeon: DJolaine Artist MD;  [facility];  Service: Cardiovascular;  Laterality: N/A;  . CARDIAC DEFIBRILLATOR PLACEMENT  06/03/11  . COLONOSCOPY  12/29/2011   Procedure: COLONOSCOPY;  Surgeon: CGatha Mayer MD;  Location: [facility];  Service: Endoscopy;  Laterality:  N/A;  . INSERTION OF IMPLANTABLE LEFT VENTRICULAR ASSIST DEVICE N/A 06/02/2012   Procedure: INSERTION OF IMPLANTABLE LEFT VENTRICULAR ASSIST DEVICE;   Surgeon: Ivin Poot, MD;  Location: [facility];  Service: Open Heart Surgery;  Laterality: N/A;  Nitric Oxide; TEE; [employer] AICD  . INTRA-AORTIC BALLOON PUMP INSERTION N/A 05/31/2012   Procedure: INTRA-AORTIC BALLOON PUMP INSERTION;  Surgeon: Jolaine Artist, MD;  Location: [facility] CATH LAB;  Service: Cardiovascular;  Laterality: N/A;  . INTRAOPERATIVE TRANSESOPHAGEAL ECHOCARDIOGRAM N/A 06/02/2012   Procedure: INTRAOPERATIVE TRANSESOPHAGEAL ECHOCARDIOGRAM;  Surgeon: Ivin Poot, MD;  Location: [facility];  Service: Open Heart Surgery;  Laterality: N/A;  . LEFT AND RIGHT HEART CATHETERIZATION WITH CORONARY ANGIOGRAM N/A 08/17/2011   Procedure: LEFT AND RIGHT HEART CATHETERIZATION WITH CORONARY ANGIOGRAM;  Surgeon: Sanda Klein, MD;  Location: [facility] CATH LAB;  Service: Cardiovascular;  Laterality: N/A;  . RIGHT HEART CATHETERIZATION N/A 05/30/2012   Procedure: RIGHT HEART CATH;  Surgeon: Jolaine Artist, MD;  Location: [facility] CATH LAB;  Service: Cardiovascular;  Laterality: N/A;  . RIGHT HEART CATHETERIZATION N/A 06/10/2014   Procedure: RIGHT HEART CATH;  Surgeon: Jolaine Artist, MD;  Location: [facility] CATH LAB;  Service: Cardiovascular;  Laterality: N/A;  . STERNAL WOUND DEBRIDEMENT N/A 03/12/2016   Procedure: WOUND DEBRIDEMENT AT [place];  Surgeon: Ivin Poot, MD;  Location: [facility] OR;  Service: Thoracic;  Laterality: N/A;     Allergies  Allergen Reactions  . Metoprolol Other (See Comments)    Passes out  . Imdur [Isosorbide Nitrate] Other (See Comments)    HEADACHE--REACTION IS SIDE EFFECT OF NITRATES    Medications:  I have reviewed the patient's current medications. Prior to Admission:  Prescriptions Prior to Admission  Medication Sig Dispense Refill Last Dose  . allopurinol (ZYLOPRIM) 300 MG tablet Take 1 tablet (300 mg total) by mouth daily. 30 tablet 6 Taking  . aspirin EC 325 MG tablet Take 1 tablet (325 mg total) by mouth daily. 30 tablet 6 Taking  . ENTRESTO 97-103 MG TAKE 1 TABLET BY  MOUTH TWICE A DAY. 60 tablet 3 Taking  . furosemide (LASIX) 40 MG tablet Take 1 tablet (40 mg total) by mouth daily as needed. Take as directed by clinic 30 tablet 3 Taking  . mexiletine (MEXITIL) 150 MG capsule Take 1 capsule (150 mg total) by mouth 2 (two) times daily. 60 capsule 5 Taking  . Multiple Vitamin (MULTIVITAMIN) tablet Take 1 tablet by mouth daily.   Taking  . pantoprazole (PROTONIX) 40 MG tablet TAKE 1 TABLET BY MOUTH EVERY DAY 90 tablet 3 Taking  . traMADol (ULTRAM) 50 MG tablet Take 1 tablet (50 mg total) by mouth every 8 (eight) hours as needed for moderate pain. 30 tablet 5 Taking  . warfarin (COUMADIN) 5 MG tablet Take 1 & 1/2 tablet daily. (Patient taking differently: Take 7.5 mg by mouth daily at 6 PM. Take 1 & 1/2 tablet daily.) 45 tablet 6 Taking   Scheduled: . allopurinol  300 mg Oral Daily  . amLODipine  2.5 mg Oral Daily  . aspirin EC  325 mg Oral Daily  . bisacodyl  10 mg Oral Daily  . ceFEPime (MAXIPIME) IV  1 g Intravenous Q8H  . hydrALAZINE  50 mg Oral BID  . [START ON 03/19/2016] Influenza vac split quadrivalent PF  0.5 mL Intramuscular Once  . mexiletine  150 mg Oral BID  . multivitamin with minerals  1 tablet Oral Daily  . pantoprazole  40 mg Oral  Daily  . pneumococcal 23 valent vaccine  0.5 mL Intramuscular Tomorrow-1000  . sacubitril-valsartan  1 tablet Oral BID  . senna-docusate  1 tablet Oral QHS  . silver nitrate applicators  1 Stick Topical Once  . sodium chloride flush  10-40 mL Intracatheter Q12H  . warfarin  12.5 mg Oral ONCE-1800  . Warfarin - Pharmacist Dosing Inpatient   Does not apply q1800   Continuous: . dextrose 5 % and 0.45% NaCl    . heparin 1,000 Units/hr (03/17/16 2210)   QPR:FFMBWGYK (SUBLIMAZE) injection, hydrALAZINE, ondansetron (ZOFRAN) IV, oxyCODONE, silver nitrate applicators, sodium chloride flush, traMADol  Abtx:  Anti-infectives    Start     Dose/Rate Route Frequency Ordered Stop   03/12/16 1430  ceFEPIme (MAXIPIME) 1 g  in dextrose 5 % 50 mL IVPB     1 g 100 mL/hr over 30 Minutes Intravenous Every 8 hours 03/12/16 1417     03/12/16 0845  vancomycin (VANCOCIN) 1,000 mg in sodium chloride 0.9 % 1,000 mL irrigation  Status:  Discontinued      Irrigation To Surgery 03/12/16 0844 03/12/16 0936   03/12/16 0833  polymyxin B 500,000 Units, bacitracin 50,000 Units in sodium chloride irrigation 0.9 % 500 mL irrigation  Status:  Discontinued       As needed 03/12/16 0833 03/12/16 0853   03/12/16 0700  vancomycin (VANCOCIN) 1,500 mg in sodium chloride 0.9 % 500 mL IVPB     1,500 mg 250 mL/hr over 120 Minutes Intravenous On call to O.R. 03/11/16 1945 03/12/16 0747   03/11/16 2030  ceFEPIme (MAXIPIME) 1 g in dextrose 5 % 50 mL IVPB  Status:  Discontinued     1 g 100 mL/hr over 30 Minutes Intravenous Every 12 hours 03/11/16 1945 03/12/16 0936      Total days of antibiotics:  Cefepime 12/7 >> Vancomycin 12/8-12/8 (given in OR)          Social History:  reports that he has never smoked. He has never used smokeless tobacco. He reports that he does not drink alcohol or use drugs.  History reviewed. No pertinent family history.  ROS as noted in HPI.  Otherwise 12-point ROS negative.  Blood pressure 114/84, pulse 80, temperature 97.9 F (36.6 C), temperature source Oral, resp. rate 18, height 6' 2"  (1.88 m), weight 244 lb 1.6 oz (110.7 kg), SpO2 97 %.   General: Patient is a well-developed and well-nourished, in no acute distress and cooperative with exam.  Head: Normocephalic and atraumatic. Eyes: EOMI, conjunctivae normal, No scleral icterus.  Neck: No JVD Cardiovascular: RRR, mechanical hum of LVAD appreciated Pulmonary/Chest: Air entry equal bilaterally, no wheezes, rales, or rhonchi. Abdominal: Soft, non-tender, non-distended, BS +, LVAD exit site with no erythema, clear dressing over wound with wound vac in place.  Currently draining serosanguinous fluids.  Extremities: No swelling or edema,  pulses symmetric  and intact bilaterally. Right PICC in place Neurological: A&O x3, cranial nerve II-XII are grossly intact, no focal motor deficit.  Skin: Warm, dry and intact with exception of LVAD exit site. No rashes or erythema. Psychiatric: Normal mood and affect. speech and behavior is normal. Cognition and memory are normal.     Results for orders placed or performed during the hospital encounter of 03/09/16 (from the past 48 hour(s))  Lactate dehydrogenase     Status: None   Collection Time: 03/17/16  4:20 AM  Result Value Ref Range   LDH 168 98 - 192 U/L  Protime-INR  Status: Abnormal   Collection Time: 03/17/16  4:20 AM  Result Value Ref Range   Prothrombin Time 16.9 (H) 11.4 - 15.2 seconds   INR 1.36   CBC     Status: Abnormal   Collection Time: 03/17/16  4:20 AM  Result Value Ref Range   WBC 8.1 4.0 - 10.5 K/uL   RBC 4.55 4.22 - 5.81 MIL/uL   Hemoglobin 12.7 (L) 13.0 - 17.0 g/dL   HCT 39.7 39.0 - 52.0 %   MCV 87.3 78.0 - 100.0 fL   MCH 27.9 26.0 - 34.0 pg   MCHC 32.0 30.0 - 36.0 g/dL   RDW 15.8 (H) 11.5 - 15.5 %   Platelets 138 (L) 150 - 400 K/uL  Basic metabolic panel     Status: None   Collection Time: 03/17/16  4:20 AM  Result Value Ref Range   Sodium 138 135 - 145 mmol/L   Potassium 4.0 3.5 - 5.1 mmol/L   Chloride 105 101 - 111 mmol/L   CO2 25 22 - 32 mmol/L   Glucose, Bld 93 65 - 99 mg/dL   BUN 12 6 - 20 mg/dL   Creatinine, Ser 0.95 0.61 - 1.24 mg/dL   Calcium 8.9 8.9 - 10.3 mg/dL   GFR calc non Af Amer >60 >60 mL/min   GFR calc Af Amer >60 >60 mL/min    Comment: (NOTE) The eGFR has been calculated using the CKD EPI equation. This calculation has not been validated in all clinical situations. eGFR's persistently <60 mL/min signify possible Chronic Kidney Disease.    Anion gap 8 5 - 15  Heparin level (unfractionated)     Status: Abnormal   Collection Time: 03/17/16  4:20 AM  Result Value Ref Range   Heparin Unfractionated 0.17 (L) 0.30 - 0.70 IU/mL    Comment:         IF HEPARIN RESULTS ARE BELOW EXPECTED VALUES, AND PATIENT DOSAGE HAS BEEN CONFIRMED, SUGGEST FOLLOW UP TESTING OF ANTITHROMBIN III LEVELS.   Heparin level (unfractionated)     Status: Abnormal   Collection Time: 03/18/16  3:57 AM  Result Value Ref Range   Heparin Unfractionated 0.22 (L) 0.30 - 0.70 IU/mL    Comment:        IF HEPARIN RESULTS ARE BELOW EXPECTED VALUES, AND PATIENT DOSAGE HAS BEEN CONFIRMED, SUGGEST FOLLOW UP TESTING OF ANTITHROMBIN III LEVELS.   CBC     Status: Abnormal   Collection Time: 03/18/16  3:57 AM  Result Value Ref Range   WBC 7.3 4.0 - 10.5 K/uL   RBC 4.58 4.22 - 5.81 MIL/uL   Hemoglobin 13.1 13.0 - 17.0 g/dL   HCT 39.5 39.0 - 52.0 %   MCV 86.2 78.0 - 100.0 fL   MCH 28.6 26.0 - 34.0 pg   MCHC 33.2 30.0 - 36.0 g/dL   RDW 15.8 (H) 11.5 - 15.5 %   Platelets 135 (L) 150 - 400 K/uL  Lactate dehydrogenase     Status: None   Collection Time: 03/18/16  3:57 AM  Result Value Ref Range   LDH 171 98 - 192 U/L  Protime-INR     Status: Abnormal   Collection Time: 03/18/16  3:57 AM  Result Value Ref Range   Prothrombin Time 18.0 (H) 11.4 - 15.2 seconds   INR 1.47       Component Value Date/Time   SDES TISSUE ABDOMEN 03/12/2016 0824   SPECREQUEST SPEC B 03/12/2016 0824   CULT  03/12/2016 1655  FEW SERRATIA MARCESCENS CRITICAL RESULT CALLED TO, READ BACK BY AND VERIFIED WITH: RN K.JOHNSON [id number] MLM NO ANAEROBES ISOLATED    REPTSTATUS 03/17/2016 FINAL 03/12/2016 0824   No results found. Recent Results (from the past 240 hour(s))  MRSA PCR Screening     Status: None   Collection Time: 03/09/16  6:23 PM  Result Value Ref Range Status   MRSA by PCR NEGATIVE NEGATIVE Final    Comment:        The GeneXpert MRSA Assay (FDA approved for NASAL specimens only), is one component of a comprehensive MRSA colonization surveillance program. It is not intended to diagnose MRSA infection nor to guide or monitor treatment for MRSA infections.     Surgical PCR screen     Status: None   Collection Time: 03/11/16 11:40 PM  Result Value Ref Range Status   MRSA, PCR NEGATIVE NEGATIVE Final   Staphylococcus aureus NEGATIVE NEGATIVE Final    Comment:        The Xpert SA Assay (FDA approved for NASAL specimens in patients over 23 years of age), is one component of a comprehensive surveillance program.  Test performance has been validated by [facility] for patients greater than or equal to 18 year old. It is not intended to diagnose infection nor to guide or monitor treatment.   Anaerobic culture     Status: None   Collection Time: 03/12/16  8:18 AM  Result Value Ref Range Status   Specimen Description WOUND ABDOMEN  Final   Special Requests SPEC A  Final   Culture NO ANAEROBES ISOLATED  Final   Report Status 03/17/2016 FINAL  Final  Aerobic Culture (superficial specimen)     Status: None   Collection Time: 03/12/16  8:18 AM  Result Value Ref Range Status   Specimen Description WOUND ABDOMEN  Final   Special Requests SPEC A  Final   Gram Stain   Final    ABUNDANT WBC PRESENT, PREDOMINANTLY PMN NO ORGANISMS SEEN    Culture   Final    FEW SERRATIA MARCESCENS CRITICAL RESULT CALLED TO, READ BACK BY AND VERIFIED WITH: RN K.JOHNSON [phone] MLM    Report Status 03/14/2016 FINAL  Final   Organism ID, Bacteria SERRATIA MARCESCENS  Final      Susceptibility   Serratia marcescens - MIC*    CEFAZOLIN >=64 RESISTANT Resistant     CEFEPIME <=1 SENSITIVE Sensitive     CEFTAZIDIME <=1 SENSITIVE Sensitive     CEFTRIAXONE <=1 SENSITIVE Sensitive     CIPROFLOXACIN 2 INTERMEDIATE Intermediate     GENTAMICIN <=1 SENSITIVE Sensitive     TRIMETH/SULFA <=20 SENSITIVE Sensitive     * FEW SERRATIA MARCESCENS  Aerobic/Anaerobic Culture (surgical/deep wound)     Status: None   Collection Time: 03/12/16  8:24 AM  Result Value Ref Range Status   Specimen Description TISSUE ABDOMEN  Final   Special Requests SPEC B  Final   Gram Stain    Final    FEW WBC PRESENT,BOTH PMN AND MONONUCLEAR NO ORGANISMS SEEN    Culture   Final    FEW SERRATIA MARCESCENS CRITICAL RESULT CALLED TO, READ BACK BY AND VERIFIED WITH: RN K.JOHNSON [id number] MLM NO ANAEROBES ISOLATED    Report Status 03/17/2016 FINAL  Final   Organism ID, Bacteria SERRATIA MARCESCENS  Final      Susceptibility   Serratia marcescens - MIC*    CEFAZOLIN >=64 RESISTANT Resistant     CEFEPIME <=1 SENSITIVE  Sensitive     CEFTAZIDIME <=1 SENSITIVE Sensitive     CEFTRIAXONE <=1 SENSITIVE Sensitive     CIPROFLOXACIN 2 INTERMEDIATE Intermediate     GENTAMICIN <=1 SENSITIVE Sensitive     TRIMETH/SULFA <=20 SENSITIVE Sensitive     * FEW SERRATIA MARCESCENS      03/18/2016, 10:10 AM     LOS: 9 days    Records and images were personally reviewed where available.

## 2016-03-19 DIAGNOSIS — T148XXA Other injury of unspecified body region, initial encounter: Secondary | ICD-10-CM

## 2016-03-19 DIAGNOSIS — A488 Other specified bacterial diseases: Secondary | ICD-10-CM

## 2016-03-19 DIAGNOSIS — L089 Local infection of the skin and subcutaneous tissue, unspecified: Secondary | ICD-10-CM

## 2016-03-19 DIAGNOSIS — J156 Pneumonia due to other aerobic Gram-negative bacteria: Secondary | ICD-10-CM

## 2016-03-19 LAB — CBC
HCT: 41.1 % (ref 39.0–52.0)
Hemoglobin: 13.2 g/dL (ref 13.0–17.0)
MCH: 28 pg (ref 26.0–34.0)
MCHC: 32.1 g/dL (ref 30.0–36.0)
MCV: 87.3 fL (ref 78.0–100.0)
Platelets: 147 10*3/uL — ABNORMAL LOW (ref 150–400)
RBC: 4.71 MIL/uL (ref 4.22–5.81)
RDW: 15.9 % — ABNORMAL HIGH (ref 11.5–15.5)
WBC: 8.3 10*3/uL (ref 4.0–10.5)

## 2016-03-19 LAB — HEPARIN LEVEL (UNFRACTIONATED): Heparin Unfractionated: 0.22 IU/mL — ABNORMAL LOW (ref 0.30–0.70)

## 2016-03-19 LAB — PROTIME-INR
INR: 1.66
Prothrombin Time: 19.8 seconds — ABNORMAL HIGH (ref 11.4–15.2)

## 2016-03-19 LAB — LACTATE DEHYDROGENASE: LDH: 179 U/L (ref 98–192)

## 2016-03-19 MED ORDER — WARFARIN SODIUM 10 MG PO TABS
12.5000 mg | ORAL_TABLET | Freq: Once | ORAL | Status: AC
  Administered 2016-03-19: 12.5 mg via ORAL
  Filled 2016-03-19: qty 1

## 2016-03-19 NOTE — Progress Notes (Signed)
7 Days Post-Op Procedure(s) (LRB): WOUND DEBRIDEMENT AT LVAD SITE (N/A) APPLICATION OF WOUND VAC (N/A) Subjective: Minimal bloody drainage from wound VAC INR increasing, remains on IV heparin  Objective: Vital signs in last 24 hours: Temp:  [97.5 F (36.4 C)-98.4 F (36.9 C)] 98 F (36.7 C) (12/15 1622) Pulse Rate:  [84-90] 84 (12/15 0440) Cardiac Rhythm: Ventricular tachycardia (12/15 0910) Resp:  [16-21] 19 (12/15 1622) BP: (81-100)/(57-86) 100/57 (12/15 1622) SpO2:  [96 %-99 %] 98 % (12/15 1622) Weight:  [244 lb 3.2 oz (110.8 kg)] 244 lb 3.2 oz (110.8 kg) (12/15 0440)  Hemodynamic parameters for last 24 hours:  stable  Intake/Output from previous day: 12/14 0701 - 12/15 0700 In: 530 [P.O.:240; I.V.:240; IV Piggyback:50] Out: 300 [Urine:250; Drains:50] Intake/Output this shift: Total I/O In: 640 [P.O.:500; I.V.:90; IV Piggyback:50] Out: -   Wound VAC intact without blood in the sponge with good suction  Lab Results:  Recent Labs  03/18/16 0357 03/19/16 0500  WBC 7.3 8.3  HGB 13.1 13.2  HCT 39.5 41.1  PLT 135* 147*   BMET:  Recent Labs  03/17/16 0420  NA 138  K 4.0  CL 105  CO2 25  GLUCOSE 93  BUN 12  CREATININE 0.95  CALCIUM 8.9    PT/INR:  Recent Labs  03/19/16 0500  LABPROT 19.8*  INR 1.66   ABG    Component Value Date/Time   PHART 7.404 03/12/2016 0326   HCO3 24.9 03/12/2016 0326   TCO2 26 11/06/2015 0825   ACIDBASEDEF 2.0 06/10/2014 1337   O2SAT 95.8 03/12/2016 0326   CBG (last 3)  No results for input(s): GLUCAP in the last 72 hours.  Assessment/Plan: S/P Procedure(s) (LRB): WOUND DEBRIDEMENT AT LVAD SITE (N/A) APPLICATION OF WOUND VAC (N/A) Patient appears to be ready soon for discharge and outpatient wound VAC therapy and IV antibiotics   LOS: 10 days    Kathlee Nations Trigt III 03/19/2016

## 2016-03-19 NOTE — Progress Notes (Signed)
INFECTIOUS DISEASE PROGRESS NOTE  ID: Robert [name] is a 60 y.o. male with  Principal Problem:   Serratia marcescens infection (HCC) Active Problems:   Presence of left ventricular assist device (LVAD) (HCC)  Subjective: Seen and examined this morning.  Doing well without complaints.  Abtx:  Anti-infectives    Start     Dose/Rate Route Frequency Ordered Stop   03/18/16 2200  cefTRIAXone (ROCEPHIN) 2 g in dextrose 5 % 50 mL IVPB     2 g 100 mL/hr over 30 Minutes Intravenous Every 12 hours 03/18/16 1346     03/18/16 1200  cefTRIAXone (ROCEPHIN) 2 g in dextrose 5 % 50 mL IVPB  Status:  Discontinued     2 g 100 mL/hr over 30 Minutes Intravenous Every 24 hours 03/18/16 1151 03/18/16 1346   03/12/16 1430  ceFEPIme (MAXIPIME) 1 g in dextrose 5 % 50 mL IVPB  Status:  Discontinued     1 g 100 mL/hr over 30 Minutes Intravenous Every 8 hours 03/12/16 1417 03/18/16 1151   03/12/16 0845  vancomycin (VANCOCIN) 1,000 mg in sodium chloride 0.9 % 1,000 mL irrigation  Status:  Discontinued      Irrigation To Surgery 03/12/16 0844 03/12/16 0936   03/12/16 0833  polymyxin B 500,000 Units, bacitracin 50,000 Units in sodium chloride irrigation 0.9 % 500 mL irrigation  Status:  Discontinued       As needed 03/12/16 0833 03/12/16 0853   03/12/16 0700  vancomycin (VANCOCIN) 1,500 mg in sodium chloride 0.9 % 500 mL IVPB     1,500 mg 250 mL/hr over 120 Minutes Intravenous On call to O.R. 03/11/16 1945 03/12/16 0747   03/11/16 2030  ceFEPIme (MAXIPIME) 1 g in dextrose 5 % 50 mL IVPB  Status:  Discontinued     1 g 100 mL/hr over 30 Minutes Intravenous Every 12 hours 03/11/16 1945 03/12/16 0936      Medications:  I have reviewed the patient's current medications. Scheduled: . allopurinol  300 mg Oral Daily  . amLODipine  2.5 mg Oral Daily  . aspirin EC  325 mg Oral Daily  . bisacodyl  10 mg Oral Daily  . cefTRIAXone (ROCEPHIN)  IV  2 g Intravenous Q12H  . hydrALAZINE  50 mg Oral BID  . Influenza  vac split quadrivalent PF  0.5 mL Intramuscular Once  . mexiletine  150 mg Oral BID  . multivitamin with minerals  1 tablet Oral Daily  . pantoprazole  40 mg Oral Daily  . pneumococcal 23 valent vaccine  0.5 mL Intramuscular Tomorrow-1000  . sacubitril-valsartan  1 tablet Oral BID  . senna-docusate  1 tablet Oral QHS  . silver nitrate applicators  1 Stick Topical Once  . sodium chloride flush  10-40 mL Intracatheter Q12H  . Warfarin - Pharmacist Dosing Inpatient   Does not apply q1800   Continuous: . dextrose 5 % and 0.45% NaCl    . heparin 1,000 Units/hr (03/18/16 2249)   TDS:KAJGOTLX (SUBLIMAZE) injection, hydrALAZINE, ondansetron (ZOFRAN) IV, oxyCODONE, silver nitrate applicators, sodium chloride flush, traMADol  Objective: Vital signs in last 24 hours: Temp:  [97.7 F (36.5 C)-98.4 F (36.9 C)] 98 F (36.7 C) (12/15 0809) Pulse Rate:  [84-90] 84 (12/15 0440) Resp:  [16-21] 19 (12/15 0809) BP: (81-99)/(71-86) 98/86 (12/15 0809) SpO2:  [96 %-99 %] 97 % (12/15 0809) Weight:  [244 lb 3.2 oz (110.8 kg)] 244 lb 3.2 oz (110.8 kg) (12/15 0440)  General: Comfortable, sitting in chair by window,  watching TV Head: Normocephalic and atraumatic. Eyes: EOMI, conjunctivae normal, No scleral icterus.  Neck: No JVD Pulmonary/Chest: normal effort Abdominal: Soft, non-tender, non-distended, LVAD exit site with no erythema, clear dressing over wound with wound vac in place.  Currently draining serosanguinous fluids.  Extremities: Right PICC in place Neurological: A&O, grossly normal  Skin: Warm, dry and intact with exception of LVAD exit site. No rashes or erythema.  Lab Results  Recent Labs  03/17/16 0420 03/18/16 0357 03/19/16 0500  WBC 8.1 7.3 8.3  HGB 12.7* 13.1 13.2  HCT 39.7 39.5 41.1  NA 138  --   --   K 4.0  --   --   CL 105  --   --   CO2 25  --   --   BUN 12  --   --   CREATININE 0.95  --   --    Liver Panel No results for input(s): PROT, ALBUMIN, AST, ALT, ALKPHOS,  BILITOT, BILIDIR, IBILI in the last 72 hours. Sedimentation Rate No results for input(s): ESRSEDRATE in the last 72 hours. C-Reactive Protein No results for input(s): CRP in the last 72 hours.  Microbiology: Recent Results (from the past 240 hour(s))  MRSA PCR Screening     Status: None   Collection Time: 03/09/16  6:23 PM  Result Value Ref Range Status   MRSA by PCR NEGATIVE NEGATIVE Final    Comment:        The GeneXpert MRSA Assay (FDA approved for NASAL specimens only), is one component of a comprehensive MRSA colonization surveillance program. It is not intended to diagnose MRSA infection nor to guide or monitor treatment for MRSA infections.   Surgical PCR screen     Status: None   Collection Time: 03/11/16 11:40 PM  Result Value Ref Range Status   MRSA, PCR NEGATIVE NEGATIVE Final   Staphylococcus aureus NEGATIVE NEGATIVE Final    Comment:        The Xpert SA Assay (FDA approved for NASAL specimens in patients over 60 years of age), is one component of a comprehensive surveillance program.  Test performance has been validated by [facility] for patients greater than or equal to 557 year old. It is not intended to diagnose infection nor to guide or monitor treatment.   Anaerobic culture     Status: None   Collection Time: 03/12/16  8:18 AM  Result Value Ref Range Status   Specimen Description WOUND ABDOMEN  Final   Special Requests SPEC A  Final   Culture NO ANAEROBES ISOLATED  Final   Report Status 03/17/2016 FINAL  Final  Aerobic Culture (superficial specimen)     Status: None   Collection Time: 03/12/16  8:18 AM  Result Value Ref Range Status   Specimen Description WOUND ABDOMEN  Final   Special Requests SPEC A  Final   Gram Stain   Final    ABUNDANT WBC PRESENT, PREDOMINANTLY PMN NO ORGANISMS SEEN    Culture   Final    FEW SERRATIA MARCESCENS CRITICAL RESULT CALLED TO, READ BACK BY AND VERIFIED WITH: RN K.JOHNSON [id number]    Report Status  03/14/2016 FINAL  Final   Organism ID, Bacteria SERRATIA MARCESCENS  Final      Susceptibility   Serratia marcescens - MIC*    CEFAZOLIN >=64 RESISTANT Resistant     CEFEPIME <=1 SENSITIVE Sensitive     CEFTAZIDIME <=1 SENSITIVE Sensitive     CEFTRIAXONE <=1 SENSITIVE Sensitive  CIPROFLOXACIN 2 INTERMEDIATE Intermediate     GENTAMICIN <=1 SENSITIVE Sensitive     TRIMETH/SULFA <=20 SENSITIVE Sensitive     * FEW SERRATIA MARCESCENS  Aerobic/Anaerobic Culture (surgical/deep wound)     Status: None   Collection Time: 03/12/16  8:24 AM  Result Value Ref Range Status   Specimen Description TISSUE ABDOMEN  Final   Special Requests SPEC B  Final   Gram Stain   Final    FEW WBC PRESENT,BOTH PMN AND MONONUCLEAR NO ORGANISMS SEEN    Culture   Final    FEW SERRATIA MARCESCENS CRITICAL RESULT CALLED TO, READ BACK BY AND VERIFIED WITH: RN K.JOHNSON [id number] MLM NO ANAEROBES ISOLATED    Report Status 03/17/2016 FINAL  Final   Organism ID, Bacteria SERRATIA MARCESCENS  Final      Susceptibility   Serratia marcescens - MIC*    CEFAZOLIN >=64 RESISTANT Resistant     CEFEPIME <=1 SENSITIVE Sensitive     CEFTAZIDIME <=1 SENSITIVE Sensitive     CEFTRIAXONE <=1 SENSITIVE Sensitive     CIPROFLOXACIN 2 INTERMEDIATE Intermediate     GENTAMICIN <=1 SENSITIVE Sensitive     TRIMETH/SULFA <=20 SENSITIVE Sensitive     * FEW SERRATIA MARCESCENS    Studies/Results: No results found.   Assessment/Plan: 1.  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  Now s/p debridement with CT surgery on 12/8 and deep cultures again growing Serratia. - continue with Ceftriaxone 2gm Q12H.  Will plan for 6 week course of treatment.  6 weeks from date of debridement would be 04/23/2016. - holding Rifampin for now as micro lab unable to confirm susceptibility and drug-drug interactions.  - I will see him in clinic in 6 weeks and decide regarding further anbx at that time.     Total days of antibiotics:  Ceftriaxone 12/14 >> Cefepime 12/7 >>12/14 Vancomycin 12/8 >>12/8 (given in OR)         Gwynn Burly Infectious Diseases (pager) [phone] [email or web address] 03/19/2016, 9:55 AM  LOS: 10 days

## 2016-03-19 NOTE — Progress Notes (Signed)
ANTICOAGULATION CONSULT NOTE  Pharmacy Consult for heparin/coumadin Indication: LVAD  Allergies  Allergen Reactions  . Metoprolol Other (See Comments)    Passes out  . Imdur [Isosorbide Nitrate] Other (See Comments)    HEADACHE--REACTION IS SIDE EFFECT OF NITRATES    Patient Measurements: Height: 6\' 2"  (188 cm) Weight: 244 lb 3.2 oz (110.8 kg) IBW/kg (Calculated) : 82.2 Heparin Dosing Weight: 106  Vital Signs: Temp: 97.5 F (36.4 C) (12/15 1115) Temp Source: Oral (12/15 1115) BP: 93/76 (12/15 1115) Pulse Rate: 84 (12/15 0440)  Labs:  Recent Labs  03/17/16 0420 03/18/16 0357 03/19/16 0500  HGB 12.7* 13.1 13.2  HCT 39.7 39.5 41.1  PLT 138* 135* 147*  LABPROT 16.9* 18.0* 19.8*  INR 1.36 1.47 1.66  HEPARINUNFRC 0.17* 0.22* 0.22*  CREATININE 0.95  --   --     Estimated Creatinine Clearance: 109.5 mL/min (by C-G formula based on SCr of 0.95 mg/dL).   Marland Kitchen dextrose 5 % and 0.45% NaCl    . heparin 1,000 Units/hr (03/18/16 2249)     Assessment: 60 y.o. male with LVAD s/p 2014 on chronic warfarin therapy. PTA warfarin is 7.5 mg daily.   Significant bleeding noted 12/11 requiring multiple dressing changes and surgical attention. Dressing site shows some oozing today, plan is to go back to OR for I&D and wound vac placement today.   Dr. Donata Clay managing IV heparin.  Heparin level slightly below stated goal at 0.22 at 1000 units/hr.  No bleeding noted currently.    INR up slightly to 1.6 after warfarin 12.5mg  last night,  PTA Coumadin dose 7.5 mg daily.  Goal of Therapy:  Heparin level  Goal 0.3 per MD INR goal 2-2.5 Monitor platelets by anticoagulation protocol: Yes   Plan:  Continue heparin IV at 1000 units/hr Warfarin 12.5 mg x 1 tonight Daily heparin level, CBC and INR.  Leota Sauers Pharm.D. CPP, BCPS Clinical Pharmacist [phone] 03/19/2016 3:05 PM

## 2016-03-19 NOTE — Progress Notes (Signed)
[facility] forms for vac signed and faced to [facility]. Will cont to follow.

## 2016-03-19 NOTE — Progress Notes (Signed)
Advanced Heart Failure VAD Team Note  Subjective:    Admitted 03/09/16 for heparin bridge for Driveline debridement   Underwent debridement of driveline wound with placement of wound vac12/8.   Yesterday amlodipine added for elevated MAP.   Denies pain/SOB.   LVAD INTERROGATION:  HeartMate II LVAD:  Flow 3.9  liters/min, speed 9400, power 5, PI 6.7 Objective:    Vital Signs:   Temp:  [97.7 F (36.5 C)-98.4 F (36.9 C)] 98 F (36.7 C) (12/15 0809) Pulse Rate:  [84-90] 84 (12/15 0440) Resp:  [16-21] 19 (12/15 0809) BP: (81-99)/(71-86) 98/86 (12/15 0809) SpO2:  [96 %-99 %] 97 % (12/15 0809) Weight:  [244 lb 3.2 oz (110.8 kg)] 244 lb 3.2 oz (110.8 kg) (12/15 0440) Last BM Date: 03/18/16 (per report )   Mean arterial Pressure  80s  Intake/Output:   Intake/Output Summary (Last 24 hours) at 03/19/16 1039 Last data filed at 03/19/16 1000  Gross per 24 hour  Intake              850 ml  Output               50 ml  Net              800 ml     Physical Exam: GENERAL: NAD. Sitting in the chair.  HEENT: Normal  NECK: Supple, JVP 5-6; 2+ bilaterally, no bruits. No thyromegaly or nodule noted.  CARDIAC: Mechanical heart sounds with LVAD hum present.  LUNGS: Clear, normal effort. ABDOMEN: Soft, round, NT, ND, no HSM. No bruits or masses. +BS  LVAD exit site:  Drive line dressing wit VAC in place CDI  EXTREMITIES: Warm and dry, no cyanosis, clubbing, rash. No peripheral edema. NEUROLOGIC: Alert and oriented x 4. Gait steady. No aphasia. No dysarthria.Affect pleasant.   Telemetry: NSR 90s   Labs: Basic Metabolic Panel:  Recent Labs Lab 03/13/16 0135 03/14/16 0202 03/15/16 0353 03/16/16 0209 03/17/16 0420  NA 134* 135 136 137 138  K 3.7 3.9 4.0 4.3 4.0  CL 101 102 105 105 105  CO2 26 22 24 25 25   GLUCOSE 112* 95 91 88 93  BUN 9 12 10 10 12   CREATININE 1.13 1.21 1.02 1.03 0.95  CALCIUM 8.6* 8.8* 8.8* 8.8* 8.9    Liver Function Tests:  Recent  Labs Lab 03/14/16 0202  AST 15  ALT 13*  ALKPHOS 79  BILITOT 0.6  PROT 6.5  ALBUMIN 3.6   No results for input(s): LIPASE, AMYLASE in the last 168 hours. No results for input(s): AMMONIA in the last 168 hours.  CBC:  Recent Labs Lab 03/15/16 0353 03/16/16 0209 03/17/16 0420 03/18/16 0357 03/19/16 0500  WBC 7.9 7.9 8.1 7.3 8.3  HGB 12.8* 12.4* 12.7* 13.1 13.2  HCT 38.5* 37.8* 39.7 39.5 41.1  MCV 86.3 86.5 87.3 86.2 87.3  PLT 145* 137* 138* 135* 147*    INR:  Recent Labs Lab 03/15/16 0400 03/16/16 0209 03/17/16 0420 03/18/16 0357 03/19/16 0500  INR 1.22 1.29 1.36 1.47 1.66    Other results:    Imaging: No results found.   Medications:     Scheduled Medications: . allopurinol  300 mg Oral Daily  . amLODipine  2.5 mg Oral Daily  . aspirin EC  325 mg Oral Daily  . bisacodyl  10 mg Oral Daily  . cefTRIAXone (ROCEPHIN)  IV  2 g Intravenous Q12H  . hydrALAZINE  50 mg Oral BID  . Influenza vac split quadrivalent PF  0.5 mL Intramuscular Once  . mexiletine  150 mg Oral BID  . multivitamin with minerals  1 tablet Oral Daily  . pantoprazole  40 mg Oral Daily  . pneumococcal 23 valent vaccine  0.5 mL Intramuscular Tomorrow-1000  . sacubitril-valsartan  1 tablet Oral BID  . senna-docusate  1 tablet Oral QHS  . silver nitrate applicators  1 Stick Topical Once  . sodium chloride flush  10-40 mL Intracatheter Q12H  . Warfarin - Pharmacist Dosing Inpatient   Does not apply q1800    Infusions: . dextrose 5 % and 0.45% NaCl    . heparin 1,000 Units/hr (03/18/16 2249)    PRN Medications: fentaNYL (SUBLIMAZE) injection, hydrALAZINE, ondansetron (ZOFRAN) IV, oxyCODONE, silver nitrate applicators, sodium chloride flush, traMADol   Assessment/Plan    1) Driveline infection, recurrent  - S/p wound debridement and I&D 12/8. Wound vac in place - Dressing CDI. VAC in place no dressing until next week.  - Wound cultures- Serratia again. - Antibiotics switched  to ceftriaxone 2 grams every 12 hours per ID. Antibiotic duration per ID.      - Discussed with [facility] regarding possible Ia time due to recurrent Driveline infection but in order to prioritize would require culture data and evidence of deep infection (need for debridement, etc).  2) Chronic systolic HF, s/p HMII LVAD implant 05/2012.  - NYHA I-II. Stable at this time. - No longer listed due to cirrhosis.   - Intolerant to even low dose BB. - Continue current meds. Can use lasix as needed.  3) Sore throat - Has some trauma from ETT. Improving 4) HTN-  -Maps elevated. Continue entresto 97/103 twice a day. Continue hydralazine to 50 mg twice a day. Add 2.5 mg amlodipine daily.   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 - ICD now at EOL. We have decided to not replace given that risk of infection felt to be higher than benefit or replacing as VT usually well tolerated in setting of VAD support 6) PVCs - Much improved with treatment of hyperthyroidism.  7) Anticoagulation with coumadin - ASA 325 mg for LVAD.  - Continue heparin until INR >1.8. Todays INR 1.66.  8) LVAD  - Stable on 9400. LDH stable.  - INR 1.66 today. Continue heparin/coumadin 9) Hyperthyroidism - Amio induced. Follows with Dr. Evlyn Kanner. Now off amio.  - He is now off methimazole.   [facility] set up for home antibiotics and VAC application completed. Possible d/c Sat or Sun   I reviewed the LVAD parameters from today, and compared the results to the patient's prior recorded data.  No programming changes were made.  The LVAD is functioning within specified parameters.  The patient performs LVAD self-test daily.  LVAD interrogation was negative for any significant power changes, alarms or PI events/speed drops.  LVAD equipment check completed and is in good working order.  Back-up equipment present.   LVAD education done on emergency procedures and precautions and reviewed exit site care.  Length of Stay: 10  Tonye Becket,  NP 03/19/2016, 10:39 AM  VAD Team --- VAD ISSUES ONLY--- Pager [phone] (7am - 7am)  Advanced Heart Failure Team Pager [phone] (M-F; 7a - 4p)  Please contact [facility] for night-coverage after hours (4p -7a ) and weekends on [email or web address]  Patient seen and examined with Tonye Becket, NP. We discussed all aspects of the encounter. I agree with the assessment and plan as stated above.   Doing well. INR coming up. PICC in for IV  abx. INR 1.7. Likely home in am if INR 1.8 or greater. Appreciate ID input. VAD parameters stable.  Bensimhon, Daniel,MD 3:24 PM

## 2016-03-20 DIAGNOSIS — Z95811 Presence of heart assist device: Secondary | ICD-10-CM | POA: Diagnosis not present

## 2016-03-20 LAB — BASIC METABOLIC PANEL
ANION GAP: 13 (ref 5–15)
BUN: 13 mg/dL (ref 6–20)
CALCIUM: 9.2 mg/dL (ref 8.9–10.3)
CO2: 20 mmol/L — AB (ref 22–32)
CREATININE: 1.02 mg/dL (ref 0.61–1.24)
Chloride: 103 mmol/L (ref 101–111)
GFR calc Af Amer: >60 mL/min (ref >60)
GLUCOSE: 85 mg/dL (ref 65–99)
Potassium: 4.2 mmol/L (ref 3.5–5.1)
Sodium: 136 mmol/L (ref 135–145)

## 2016-03-20 LAB — CBC
HCT: 41.4 % (ref 39.0–52.0)
Hemoglobin: 13.5 g/dL (ref 13.0–17.0)
MCH: 28 pg (ref 26.0–34.0)
MCHC: 32.6 g/dL (ref 30.0–36.0)
MCV: 85.9 fL (ref 78.0–100.0)
Platelets: 161 10*3/uL (ref 150–400)
RBC: 4.82 MIL/uL (ref 4.22–5.81)
RDW: 15.7 % — ABNORMAL HIGH (ref 11.5–15.5)
WBC: 7.7 10*3/uL (ref 4.0–10.5)

## 2016-03-20 LAB — PROTIME-INR
INR: 1.9
Prothrombin Time: 22 seconds — ABNORMAL HIGH (ref 11.4–15.2)

## 2016-03-20 LAB — LACTATE DEHYDROGENASE: LDH: 195 U/L — ABNORMAL HIGH (ref 98–192)

## 2016-03-20 LAB — HEPARIN LEVEL (UNFRACTIONATED): HEPARIN UNFRACTIONATED: 0.25 [IU]/mL — AB (ref 0.30–0.70)

## 2016-03-20 MED ORDER — WARFARIN SODIUM 10 MG PO TABS: 10.0000 mg | ORAL_TABLET | Freq: Once | ORAL | Status: DC

## 2016-03-20 MED ORDER — AMLODIPINE BESYLATE 2.5 MG PO TABS: 2.5000 mg | ORAL_TABLET | Freq: Every day | ORAL | 6 refills | Status: DC

## 2016-03-20 MED ORDER — WARFARIN SODIUM 10 MG PO TABS
10.0000 mg | ORAL_TABLET | Freq: Once | ORAL | Status: AC
  Administered 2016-03-20: 10 mg via ORAL
  Filled 2016-03-20: qty 1

## 2016-03-20 MED ORDER — CEFTRIAXONE IV (FOR PTA / DISCHARGE USE ONLY): 2.0000 g | Freq: Two times a day (BID) | INTRAVENOUS | 0 refills | Status: AC

## 2016-03-20 MED ORDER — HYDRALAZINE HCL 50 MG PO TABS: 50.0000 mg | ORAL_TABLET | Freq: Two times a day (BID) | ORAL | 6 refills | Status: DC

## 2016-03-20 NOTE — Progress Notes (Addendum)
ANTICOAGULATION CONSULT NOTE  Pharmacy Consult for heparin/coumadin Indication: LVAD  Allergies  Allergen Reactions  . Metoprolol Other (See Comments)    Passes out  . Imdur [Isosorbide Nitrate] Other (See Comments)    HEADACHE--REACTION IS SIDE EFFECT OF NITRATES    Patient Measurements: Height: 6\' 2"  (188 cm) Weight: 241 lb 3.2 oz (109.4 kg) IBW/kg (Calculated) : 82.2 Heparin Dosing Weight: 106  Vital Signs: Temp: 98 F (36.7 C) (12/16 0730) Temp Source: Oral (12/16 0730) BP: 106/70 (12/16 0800) Pulse Rate: 93 (12/16 0327)  Labs:  Recent Labs  03/18/16 0357 03/19/16 0500 03/20/16 0413  HGB 13.1 13.2 13.5  HCT 39.5 41.1 41.4  PLT 135* 147* 161  LABPROT 18.0* 19.8* 22.0*  INR 1.47 1.66 1.90  HEPARINUNFRC 0.22* 0.22* 0.25*  CREATININE  --   --  1.02    Estimated Creatinine Clearance: 101.4 mL/min (by C-G formula based on SCr of 1.02 mg/dL).   Marland Kitchen dextrose 5 % and 0.45% NaCl       Assessment: 60 y.o. male with LVAD s/p 2014 on chronic warfarin therapy. PTA warfarin is 7.5 mg daily.   Significant bleeding noted 12/11 requiring multiple dressing changes and surgical attention. Dressing site shows some oozing today, plan is to go back to OR for I&D and wound vac placement today.   Dr. Donata Clay managing IV heparin.  Heparin level slightly below stated goal at 0.22 at 1000 units/hr.  No bleeding noted currently.    INR up to 1.9 after warfarin 12.5mg  x2. Will give 10mg  tonight.  PTA Coumadin dose 7.5 mg daily.  If plan is for d/c today would recommend 10mg  today prior to d/c then have patient resume his home dose of warfarin (7.5mg  qd) tomorrow.   Goal of Therapy:  Heparin level  Goal 0.3 per MD INR goal 2-2.5 Monitor platelets by anticoagulation protocol: Yes   Plan:  Stop heparin this am Warfarin 10mg  tonight  Sheppard Coil PharmD., BCPS Clinical Pharmacist Pager [phone] 03/20/2016 8:47 AM

## 2016-03-20 NOTE — Discharge Summary (Signed)
Discharge Summary    Patient ID: Robert [name],  MRN: [id number], DOB/AGE: 1955-04-24 60 y.o.  Admit date: 03/09/2016 Discharge date: 03/20/2016  Primary Care Provider: [facility] Primary Cardiologist: D. 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 Metoprolol Other (See Comments)    Passes out  . Imdur [Isosorbide Nitrate] Other (See Comments)    HEADACHE--REACTION IS SIDE EFFECT OF NITRATES    Diagnostic Studies/Procedures    03/12/2016  1. Excisional debridement of deep abdominal wound infection. 2. Placement of wound VAC. 3. Wound irrigation with antibiotic solution. 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 status post dual chamber Medtronic ICD implanted April 2011. Cath 2011 showed normal coronaries. He also has a history of VTach. Currently listed as 1b list for heart tx at [facility].  Amio recently stopped due to thyrotoxicity. Placed on prednisone and tapazole. Had VT off amio and mexilitene started.  Had [place] on 8/17as part of dual listing process at [facility]. Normal filling pressures and cardiac output.   Recently admitted in 8/17 for recurrent serratia driveline exit site infection. Back on cipro.  Had has ongoing exudate from driveline despite chronic oral antibiotics, requiring multiple dressing changes a day.  Wound culture 11/22 + for Serratia.  He is stable from HF standpoint.   No fever or chills.  He was admitted 03/09/16 for heparin bridging in preparation for driveline debridement 03/12/16.   Hospital Course     Consultants: Cardiothoracic Surgery ; Infectious Disease  Following admission,  coumadin was held and he was placed on IV heparin.  IV Maxipime was also initiated for antibiotic coverage.  He underwent debridement of the LVAD driveline on 00/7.  A wound vac was placed. Post-operateively there was some bleeding around the surgical site.  Heparin was held, pressure dsg applied, and wound vac was d/c'd while the site was packed.  Bleeding improved by 12/10 and heparin was resumed and the wound packing was changed on 12/11. Wound Vac was replaced on 12/13.  Wound cultures again grew Serratia.  Blood cultures were negative.  ID was consulted and recommendation was made to change from cefepime to ceftriaxone given susceptibility noted on current and past cultures.  A 6 wk course of treatment was advised.  Robert [name]'s INR has slowly drifted up and is 1.9 this AM.  He will be given a dose of coumadin 40m  prior to d/c today and then resume his prior home dose of 7.5 mg daily.  Arrangements have been made with [facility] IV ceftriaxone therapy (end date 04/23/2016) and management of wound vac.  We will arrange for early follow-up in LVAD clinic next week for INR eval.  He will be discharged home today in good condition. _____________  Discharge Vitals Blood pressure 106/70, pulse 93, temperature 98 F (36.7 C), temperature source Oral, resp. rate 19, height 6' 2"  (1.88 m), weight 241 lb 3.2 oz (109.4 kg), SpO2 98 %.  Filed Weights   03/18/16 0400 03/19/16 0440 03/20/16 0327  Weight: 244 lb 1.6 oz (110.7 kg) 244 lb 3.2 oz (110.8 kg) 241  lb 3.2 oz (109.4 kg)    Labs & Radiologic Studies    CBC  Recent Labs  03/19/16 0500 03/20/16 0413  WBC 8.3 7.7  HGB 13.2 13.5  HCT 41.1 41.4  MCV 87.3 85.9  PLT 147* 782   Basic Metabolic Panel  Recent Labs  03/20/16 0413  NA 136  K 4.2  CL 103  CO2 20*  GLUCOSE 85  BUN 13  CREATININE 1.02  CALCIUM 9.2   Liver Function Tests Lab Results  Component Value Date   ALT 13 (L) 03/14/2016   AST 15 03/14/2016   ALKPHOS 79  03/14/2016   BILITOT 0.6 03/14/2016   _____________  Dg Chest 2 View  Result Date: 03/12/2016 CLINICAL DATA:  Preop EXAM: CHEST  2 VIEW COMPARISON:  10/28/2015 FINDINGS: Left AICD and LVAD remain in place, unchanged. Cardiomegaly. Lungs are clear. No effusions or acute bony abnormality. IMPRESSION: No acute cardiopulmonary disease. Electronically Signed   By: Rolm Baptise M.D.   On: 03/12/2016 08:06   Disposition   Pt is being discharged home today in good condition.  Follow-up Plans & Appointments    Follow-up Information    Bobby Rumpf, MD Follow up on 05/04/2016.   Specialty:  Infectious Diseases Why:  2 PM Contact information: [address] [place] [address] [phone]        [facility] Follow up in 1 week(s).   Specialty:  Cardiology Why:  LVAD clinic - we will arrange and contact you. Contact information: [address] [id number] [place] [phone]         Discharge Instructions    Home infusion instructions [place] May follow [place] Dosing Protocol; May administer Cathflo as needed to maintain patency of vascular access device.; Flushing of vascular access device: per [facility] [place] Protocol: 0.9% NaCl pre/post medica...    Complete by:  As directed    Instructions:  May follow [place] Dosing Protocol   Instructions:  May administer Cathflo as needed to maintain patency of vascular access device.   Instructions:  Flushing of vascular access device: per [facility] Protocol: 0.9% NaCl pre/post medication administration and prn patency; Heparin 100 u/ml, 38m for implanted ports and Heparin 10u/ml, 535mfor all other central venous catheters.   Instructions:  May follow [facility] Anaphylaxis Protocol for First Dose Administration in the home: 0.9% NaCl at 25-50 ml/hr to maintain IV access for protocol meds. Epinephrine 0.3 ml IV/IM PRN and Benadryl 25-50 IV/IM PRN s/s  of anaphylaxis.   Instructions:  [facility] Coordinator (RN) to assist per patient IV care needs in the home PRN.      Discharge Medications   Current Discharge Medication List    START taking these medications   Details  amLODipine (NORVASC) 2.5 MG tablet Take 1 tablet (2.5 mg total) by mouth daily. Qty: 30 tablet, Refills: 6    cefTRIAXone (ROCEPHIN) IVPB Inject 2 g into the vein every 12 (twelve) hours. Indication:  LVAD infection Last Day of Therapy:  04/23/16 Labs - Once weekly:  CBC/D and BMP, Labs - Every other week:  ESR and CRP Qty: 70 Units, Refills: 0    hydrALAZINE (APRESOLINE) 50 MG tablet Take 1 tablet (50 mg total) by mouth 2 (two) times daily. Qty: 60 tablet, Refills: 6      CONTINUE these medications which have NOT CHANGED   Details  allopurinol (ZYLOPRIM) 300 MG tablet Take  1 tablet (300 mg total) by mouth daily. Qty: 30 tablet, Refills: 6    aspirin EC 325 MG tablet Take 1 tablet (325 mg total) by mouth daily. Qty: 30 tablet, Refills: 6   Associated Diagnoses: Acute on chronic systolic heart failure (HCC)    ENTRESTO 97-103 MG TAKE 1 TABLET BY MOUTH TWICE A DAY. Qty: 60 tablet, Refills: 3   Associated Diagnoses: Left ventricular assist device (LVAD) complication, initial encounter; Acute on chronic systolic heart failure ([place]); Essential hypertension, benign    furosemide (LASIX) 40 MG tablet Take 1 tablet (40 mg total) by mouth daily as needed. Take as directed by clinic Qty: 30 tablet, Refills: 3    mexiletine (MEXITIL) 150 MG capsule Take 1 capsule (150 mg total) by mouth 2 (two) times daily. Qty: 60 capsule, Refills: 5   Associated Diagnoses: Frequent PVCs    Multiple Vitamin (MULTIVITAMIN) tablet Take 1 tablet by mouth daily.    pantoprazole (PROTONIX) 40 MG tablet TAKE 1 TABLET BY MOUTH EVERY DAY Qty: 90 tablet, Refills: 3    traMADol (ULTRAM) 50 MG tablet Take 1 tablet (50 mg total) by mouth every 8 (eight) hours as needed for  moderate pain. Qty: 30 tablet, Refills: 5    warfarin (COUMADIN) 5 MG tablet Take 1 & 1/2 tablet daily. Qty: 45 tablet, Refills: 6   Associated Diagnoses: On warfarin therapy          Outstanding Labs/Studies   INR early next week in LVAD clinic.  Duration of Discharge Encounter   Greater than 30 minutes including physician time.  Signed, Murray Hodgkins NP 03/20/2016, 11:09 AM

## 2016-03-20 NOTE — Progress Notes (Signed)
Patient ID: Robert [name], male   DOB: May 08, 1955, 60 y.o.   MRN: [id number]   Advanced Heart Failure VAD Team Note  Subjective:    Admitted 03/09/16 for heparin bridge for driveline debridement   Underwent debridement of driveline wound with placement of wound vac 12/8.   Amlodipine added for elevated MAP.   Denies pain/SOB.   LVAD INTERROGATION:  HeartMate II LVAD:  Flow 5.1  liters/min, speed 9400, power 6.3, PI 6.5 Objective:    Vital Signs:   Temp:  [97.5 F (36.4 C)-98.1 F (36.7 C)] 98 F (36.7 C) (12/16 0730) Pulse Rate:  [93] 93 (12/16 0327) Resp:  [16-25] 19 (12/16 0730) BP: (80-106)/(57-86) 106/70 (12/16 0730) SpO2:  [97 %-98 %] 98 % (12/16 0730) Weight:  [241 lb 3.2 oz (109.4 kg)] 241 lb 3.2 oz (109.4 kg) (12/16 0327) Last BM Date: 03/18/16   Mean arterial Pressure  80s  Intake/Output:   Intake/Output Summary (Last 24 hours) at 03/20/16 0741 Last data filed at 03/20/16 0400  Gross per 24 hour  Intake              810 ml  Output                0 ml  Net              810 ml     Physical Exam: GENERAL: NAD.  HEENT: Normal  NECK: Supple, JVP 5-6; 2+ bilaterally, no bruits. No thyromegaly or nodule noted.  CARDIAC: Mechanical heart sounds with LVAD hum present.  LUNGS: Clear, normal effort. ABDOMEN: Soft, round, NT, ND, no HSM. No bruits or masses. +BS  LVAD exit site:  Drive line dressing wit VAC in place CDI  EXTREMITIES: Warm and dry, no cyanosis, clubbing, rash. No peripheral edema. NEUROLOGIC: Alert and oriented x 4. Gait steady. No aphasia. No dysarthria.Affect pleasant.   Telemetry: NSR 90s-100s   Labs: Basic Metabolic Panel:  Recent Labs Lab 03/14/16 0202 03/15/16 0353 03/16/16 0209 03/17/16 0420 03/20/16 0413  NA 135 136 137 138 136  K 3.9 4.0 4.3 4.0 4.2  CL 102 105 105 105 103  CO2 22 24 25 25  20*  GLUCOSE 95 91 88 93 85  BUN 12 10 10 12 13   CREATININE 1.21 1.02 1.03 0.95 1.02  CALCIUM 8.8* 8.8* 8.8* 8.9 9.2     Liver Function Tests:  Recent Labs Lab 03/14/16 0202  AST 15  ALT 13*  ALKPHOS 79  BILITOT 0.6  PROT 6.5  ALBUMIN 3.6   No results for input(s): LIPASE, AMYLASE in the last 168 hours. No results for input(s): AMMONIA in the last 168 hours.  CBC:  Recent Labs Lab 03/16/16 0209 03/17/16 0420 03/18/16 0357 03/19/16 0500 03/20/16 0413  WBC 7.9 8.1 7.3 8.3 7.7  HGB 12.4* 12.7* 13.1 13.2 13.5  HCT 37.8* 39.7 39.5 41.1 41.4  MCV 86.5 87.3 86.2 87.3 85.9  PLT 137* 138* 135* 147* 161    INR:  Recent Labs Lab 03/16/16 0209 03/17/16 0420 03/18/16 0357 03/19/16 0500 03/20/16 0413  INR 1.29 1.36 1.47 1.66 1.90    Other results:    Imaging: No results found.   Medications:     Scheduled Medications: . allopurinol  300 mg Oral Daily  . amLODipine  2.5 mg Oral Daily  . aspirin EC  325 mg Oral Daily  . bisacodyl  10 mg Oral Daily  . cefTRIAXone (ROCEPHIN)  IV  2 g Intravenous Q12H  . hydrALAZINE  50 mg Oral BID  . mexiletine  150 mg Oral BID  . multivitamin with minerals  1 tablet Oral Daily  . pantoprazole  40 mg Oral Daily  . pneumococcal 23 valent vaccine  0.5 mL Intramuscular Tomorrow-1000  . sacubitril-valsartan  1 tablet Oral BID  . senna-docusate  1 tablet Oral QHS  . silver nitrate applicators  1 Stick Topical Once  . sodium chloride flush  10-40 mL Intracatheter Q12H  . Warfarin - Pharmacist Dosing Inpatient   Does not apply q1800    Infusions: . dextrose 5 % and 0.45% NaCl    . heparin 1,000 Units/hr (03/20/16 0400)    PRN Medications: fentaNYL (SUBLIMAZE) injection, hydrALAZINE, ondansetron (ZOFRAN) IV, oxyCODONE, silver nitrate applicators, sodium chloride flush, traMADol   Assessment/Plan    1) Driveline infection, recurrent  - S/p wound debridement and I&D 12/8. Wound vac in place - Dressing CDI. VAC in place no dressing until next week.  - Deep wound culture showed Serratia again. - Antibiotics switched to ceftriaxone 2 grams  every 12 hours per ID. Plan for ceftriaxone for 6 wks until 04/23/16.  2) Chronic systolic HF, s/p HMII LVAD implant 05/2012.  - NYHA I-II. Stable at this time. - Listed for [facility] at [facility].   - Intolerant to even low dose BB. - Continue current meds. Can use lasix as needed.  3) Sore throat - Has some trauma from ETT. Improving 4) HTN-  MAP improved with medication adjustment. Continue entresto 97/103 twice a day. Continue hydralazine to 50 mg twice a day. Add 2.5 mg amlodipine daily.  5) NSVT/VT - Off amio. On mexiletine. - ICD now at EOL. We have decided to not replace given that risk of infection felt to be higher than benefit or replacing as VT usually well tolerated in setting of VAD support 6) PVCs - Much improved with treatment of hyperthyroidism.  7) Anticoagulation with coumadin - ASA 325 mg for LVAD.  - INR 1.9 today, can stop heparin gtt.   8) LVAD  - Stable on 9400. LDH stable.  - INR 1.9, can stop heparin gtt.  Continue warfarin.  9) Hyperthyroidism - Amio induced. Follows with Dr. Evlyn Kanner. Now off amio.  - He is now off methimazole.   He may go home today.  Will need followup in LVAD clinic with INR.  He will need IV ceftriaxone for home via PICC until 04/23/16. Other meds: ASA 325, warfarin goal INR 2-2.5, allopurinol 300 daily, amlodipine 2.5 daily, mexiletine 150 bid, hydralazine 50 bid, Entresto 97/103 bid, Protonix 40 daily.   I reviewed the LVAD parameters from today, and compared the results to the patient's prior recorded data.  No programming changes were made.  The LVAD is functioning within specified parameters.  The patient performs LVAD self-test daily.  LVAD interrogation was negative for any significant power changes, alarms or PI events/speed drops.  LVAD equipment check completed and is in good working order.  Back-up equipment present.   LVAD education done on emergency procedures and precautions and reviewed exit site care.  Length of Stay:  3  Marca Ancona, MD 03/20/2016, 7:41 AM  VAD Team --- VAD ISSUES ONLY--- Pager [phone] (7am - 7am)  Advanced Heart Failure Team Pager [phone] (M-F; 7a - 4p)  Please contact [facility] for night-coverage after hours (4p -7a ) and weekends on [email or web address]

## 2016-03-20 NOTE — Progress Notes (Signed)
[facility], as nobody from case management came to pick it up. Left photocopy on chart and gave pt the original script. Told pt to ring [facility] when he got home to tell them that he is home and that he got his evening dose of abx before his discharge. Also told him to inform them that he does have the original script as well. Destry Bezdek Josephina Gip, BSN, MSN. 03/20/2016 @1830 

## 2016-03-20 NOTE — Progress Notes (Addendum)
      [address]       [place] [address]             [phone]        8 Days Post-Op Procedure(s) (LRB): WOUND DEBRIDEMENT AT LVAD SITE (N/A) APPLICATION OF WOUND VAC (N/A)  Subjective: Patient in good spirits.  Objective: Vital signs in last 24 hours: Temp:  [97.5 F (36.4 C)-98.1 F (36.7 C)] 98 F (36.7 C) (12/16 0730) Pulse Rate:  [93] 93 (12/16 0327) Cardiac Rhythm: Normal sinus rhythm (12/16 0800) Resp:  [16-25] 19 (12/16 0730) BP: (80-106)/(57-76) 106/70 (12/16 0800) SpO2:  [98 %] 98 % (12/16 0730) Weight:  [241 lb 3.2 oz (109.4 kg)] 241 lb 3.2 oz (109.4 kg) (12/16 0327)   Current Weight  03/20/16 241 lb 3.2 oz (109.4 kg)      Intake/Output from previous day: 12/15 0701 - 12/16 0700 In: 810 [P.O.:500; I.V.:210; IV Piggyback:100] Out: -    Physical Exam:  Cardiovascular: RRR. Pulmonary: Clear to auscultation bilaterally. Wounds: VAC in place.  Lab Results: CBC: Recent Labs  03/19/16 0500 03/20/16 0413  WBC 8.3 7.7  HGB 13.2 13.5  HCT 41.1 41.4  PLT 147* 161   BMET:  Recent Labs  03/20/16 0413  NA 136  K 4.2  CL 103  CO2 20*  GLUCOSE 85  BUN 13  CREATININE 1.02  CALCIUM 9.2    PT/INR:  Lab Results  Component Value Date   INR 1.90 03/20/2016   INR 1.66 03/19/2016   INR 1.47 03/18/2016   ABG:  INR: Will add last result for INR, ABG once components are confirmed Will add last 4 CBG results once components are confirmed  Assessment/Plan:  1. CV - First degree HB. HR in the 70's. On Hydralazine 50 mg bid, Entresto po bid, Norvasc 2.5 mg daily, Mexiletine 150 mg bid, and Coumadin. INR increased from 1.66 to 1.9. MAP 70-80's. Flow 5.1 liters/min speed 9600, PI 6.1 2.  Pulmonary - On room air. 3.ID-on Ceftriaxone for Serratia Marcescens. Continue wound VAC-to be changed next week.  ZIMMERMAN,DONIELLE MPA-C 03/20/2016,8:58 AM I have seen and examined Robert [name] and agree with the above assessment  and  plan.  Delight Ovens MD [place] [phone] Office [phone] 03/20/2016 10:32 AM

## 2016-03-20 NOTE — Care Management Note (Signed)
Case Management Note  Patient Details  Name: Robert [name] MRN: [id number] Date of Birth: Apr 17, 1955  Subjective/Objective:   Wound debridement of LVAD site, application of wound vac                   Action/Plan: Discharge Planning: AVS reviewed: NCM contacted [facility] Liaison for [facility] RN for IV abx. States they will have soc for today's evening dose. Waiting delivery of [employer] wound vac. Unit RN states paperwork is in shadow chart and she did give call to [facility] rep to request delivery today.   Contacted wound vac rep, Ricki. States she will follow up on delivery of wound vac.    PCP Almon Hercules MD  Expected Discharge Date:  03/20/2016             Expected Discharge Plan:  Home w Home Health Services  In-House Referral:  NA  Discharge planning Services  CM Consult  Post Acute Care Choice:  Durable Medical Equipment, Home Health Choice offered to:  Patient  DME Arranged:  IV pump/equipment, Vac DME Agency:  [facility]  HH Arranged:  RN [facility] Agency:  [facility]  Status of Service:  Completed, signed off  If discussed at Long Length of Stay Meetings, dates discussed:    Additional Comments:  Elliot Cousin, RN 03/20/2016, 12:54 PM

## 2016-03-23 ENCOUNTER — Ambulatory Visit (HOSPITAL_COMMUNITY): Payer: Self-pay | Admitting: Pharmacist

## 2016-03-23 DIAGNOSIS — Z95811 Presence of heart assist device: Secondary | ICD-10-CM

## 2016-03-23 LAB — POCT INR: INR: 1.6

## 2016-03-23 MED ORDER — LOVENOX 100 MG/ML ~~LOC~~ SOLN: 100.0000 mg | Freq: Two times a day (BID) | SUBCUTANEOUS | 1 refills | Status: DC

## 2016-03-23 MED FILL — LOVENOX 100 MG PREFILLED SY: 100 | 5 days supply | Qty: 10 | Fill #0

## 2016-03-24 ENCOUNTER — Ambulatory Visit (HOSPITAL_COMMUNITY)
Admission: RE | Admit: 2016-03-24 | Discharge: 2016-03-24 | Disposition: A | Discharge status: Home / Self Care | Payer: Medicaid Other | Source: Ambulatory Visit | Attending: Internal Medicine | Admitting: Internal Medicine

## 2016-03-24 ENCOUNTER — Ambulatory Visit (HOSPITAL_COMMUNITY): Payer: Self-pay | Admitting: Infectious Diseases

## 2016-03-24 VITALS — BP 98/75

## 2016-03-24 DIAGNOSIS — Z95811 Presence of heart assist device: Secondary | ICD-10-CM | POA: Diagnosis not present

## 2016-03-24 DIAGNOSIS — I110 Hypertensive heart disease with heart failure: Secondary | ICD-10-CM | POA: Insufficient documentation

## 2016-03-24 DIAGNOSIS — I5022 Chronic systolic (congestive) heart failure: Secondary | ICD-10-CM

## 2016-03-24 DIAGNOSIS — Z7982 Long term (current) use of aspirin: Secondary | ICD-10-CM | POA: Diagnosis not present

## 2016-03-24 DIAGNOSIS — E0590 Thyrotoxicosis, unspecified without thyrotoxic crisis or storm: Secondary | ICD-10-CM

## 2016-03-24 DIAGNOSIS — I509 Heart failure, unspecified: Secondary | ICD-10-CM | POA: Insufficient documentation

## 2016-03-24 DIAGNOSIS — I272 Pulmonary hypertension, unspecified: Secondary | ICD-10-CM | POA: Insufficient documentation

## 2016-03-24 DIAGNOSIS — Z79899 Other long term (current) drug therapy: Secondary | ICD-10-CM | POA: Insufficient documentation

## 2016-03-24 DIAGNOSIS — I493 Ventricular premature depolarization: Secondary | ICD-10-CM | POA: Diagnosis not present

## 2016-03-24 DIAGNOSIS — T829XXD Unspecified complication of cardiac and vascular prosthetic device, implant and graft, subsequent encounter: Secondary | ICD-10-CM

## 2016-03-24 DIAGNOSIS — Z7901 Long term (current) use of anticoagulants: Secondary | ICD-10-CM | POA: Insufficient documentation

## 2016-03-24 DIAGNOSIS — I10 Essential (primary) hypertension: Secondary | ICD-10-CM

## 2016-03-24 DIAGNOSIS — A498 Other bacterial infections of unspecified site: Secondary | ICD-10-CM

## 2016-03-24 LAB — PROTIME-INR
INR: 1.63
PROTHROMBIN TIME: 19.6 s — AB (ref 11.4–15.2)

## 2016-03-24 LAB — CBC WITH DIFFERENTIAL/PLATELET
BASOS PCT: 0 %
Basophils Absolute: 0 10*3/uL (ref 0.0–0.1)
Eosinophils Absolute: 0.1 10*3/uL (ref 0.0–0.7)
Eosinophils Relative: 2 %
HEMATOCRIT: 41.8 % (ref 39.0–52.0)
HEMOGLOBIN: 13.7 g/dL (ref 13.0–17.0)
LYMPHS ABS: 2 10*3/uL (ref 0.7–4.0)
Lymphocytes Relative: 27 %
MCH: 28.4 pg (ref 26.0–34.0)
MCHC: 32.8 g/dL (ref 30.0–36.0)
MCV: 86.5 fL (ref 78.0–100.0)
MONO ABS: 0.6 10*3/uL (ref 0.1–1.0)
MONOS PCT: 9 %
Neutro Abs: 4.5 10*3/uL (ref 1.7–7.7)
Neutrophils Relative %: 62 %
Platelets: 177 10*3/uL (ref 150–400)
RBC: 4.83 MIL/uL (ref 4.22–5.81)
RDW: 15 % (ref 11.5–15.5)
WBC: 7.3 10*3/uL (ref 4.0–10.5)

## 2016-03-24 LAB — COMPREHENSIVE METABOLIC PANEL
ALBUMIN: 4.1 g/dL (ref 3.5–5.0)
ALK PHOS: 90 U/L (ref 38–126)
ALT: 20 U/L (ref 17–63)
ANION GAP: 7 (ref 5–15)
AST: 25 U/L (ref 15–41)
BILIRUBIN TOTAL: 0.8 mg/dL (ref 0.3–1.2)
BUN: 10 mg/dL (ref 6–20)
CO2: 25 mmol/L (ref 22–32)
CREATININE: 1.14 mg/dL (ref 0.61–1.24)
Calcium: 9.1 mg/dL (ref 8.9–10.3)
Chloride: 107 mmol/L (ref 101–111)
GFR calc Af Amer: >60 mL/min (ref >60)
GFR calc non Af Amer: >60 mL/min (ref >60)
GLUCOSE: 96 mg/dL (ref 65–99)
Potassium: 3.9 mmol/L (ref 3.5–5.1)
SODIUM: 139 mmol/L (ref 135–145)
TOTAL PROTEIN: 7.1 g/dL (ref 6.5–8.1)

## 2016-03-24 LAB — LACTATE DEHYDROGENASE: LDH: 230 U/L — ABNORMAL HIGH (ref 98–192)

## 2016-03-24 LAB — BRAIN NATRIURETIC PEPTIDE: B Natriuretic Peptide: 37.6 pg/mL (ref 0.0–100.0)

## 2016-03-24 NOTE — Progress Notes (Signed)
LVAD CLINIC NOTE  Patient ID: Robert [name], male   DOB: [birth year], 60 y.o.   MRN: [id number] PCP: N/A HF: [place] Endocrinology: Dr Forde Dandy   HPI: 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 status post dual chamber Medtronic ICD implanted April 2011. Cath 2011 showed normal coronaries. He also has a history of VTach .   Amio recently stopped due to thyrotoxicity. Placed on prednisone and tapazole. Had VT off amio and mexilitene started.  Had [facility] on 8/17 as part of dual listing process at [facility]. Normal filling pressures and cardiac output.   Drive Line Infeciton  08/26/15 >>Serratia marcescens (rare) >>Cipro 500 mg bid x 7 days  09/29/15 >> Serratia marcescens (abundant) >>Cipro 500 mg BID x14d 11/20/15 >>Serratia marcescens (few) Admitted IV ceftriaxone >>cipro 500 mg twice daily x 30 days 02/03/16 >> Serratia species >> Cipro 500 mg BID x 14d; referral to ID  03/12/2016 Surgical Debridement with VAC placement. Full thickness 5x8x6 cm. 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 Plan to continued ced on cefepime 1 gram IV every 8 hours  03/16/2016 PICC line placed for IV antibiotics.  03/17/2016 VAC reapplied with 1 gram of A Cell applied.   Admitted to [facility] on 12/5 for heparin bridge and surgical debridement of persistent drive line infection. VAC applied with A cell. VAC maintained seal at 75 mm hg continuous therapy. Plan for ceftriaxone for 6 wks until 04/23/16. Will also need VAC changes weekly with ACell. [facility] following for home antibiotics.   He returns for LVAD follow up. Currently listed 1A at [facility] and has follow up with [facility] tomorrow. Overall feeling ok. Denies SOB/PND/Orthopnea. Weight at home 242 pounds. No BRBPR. No fever or chills.  He has follow up with ID, Dr Johnnye Sima January 30th.  Followed by [facility] for home antibiotics.   Recent [facility] on  11/06/15 as part of Transplant process RA = 6 RV =  18/3/6 PA = 19/7 (12) PCW = 8 Fick cardiac output/index = 6.1/2.6 PVR = .0.7 WU Ao sat = 98% PA sat = 70%, 69%:    LVAD interrogation reveals:  Speed:  9400 Flow: 4.8 Power: 5 6 PI: 6.4  Alarms:  none Events: 5 - 10 PI events daily  Fixed speed:  9400 Low speed limit:  8800  11V battery status: Primary controller: replace 17 months                                   Secondary controller: replace 15 months    I reviewed the LVAD parameters from today, and compared the results to the patient's prior recorded data.  No programming changes were made.  The LVAD is functioning within specified parameters.  The patient performs LVAD self-test daily.  LVAD interrogation was negative for any significant power changes, alarms or PI events/speed drops.  LVAD equipment check completed and is in good working order.  Back-up equipment present.   LVAD education done on emergency procedures and precautions and reviewed exit site care.    Past Medical History:  Diagnosis Date  . AICD (automatic cardioverter/defibrillator) present 2011  . Bronchitis   . Congestive heart failure ([place]) 2011  . Hypertension 2000  . LVAD (left ventricular assist device) present ([place]) 2014  . Pneumonia   .  Pulmonary hypertension     Current Outpatient Prescriptions  Medication Sig Dispense Refill  . allopurinol (ZYLOPRIM) 300 MG tablet Take 1 tablet (300 mg total) by mouth daily. 30 tablet 6  . amLODipine (NORVASC) 2.5 MG tablet Take 1 tablet (2.5 mg total) by mouth daily. 30 tablet 6  . aspirin EC 325 MG tablet Take 1 tablet (325 mg total) by mouth daily. 30 tablet 6  . cefTRIAXone (ROCEPHIN) IVPB Inject 2 g into the vein every 12 (twelve) hours. Indication:  LVAD infection Last Day of Therapy:  04/23/16 Labs - Once weekly:  CBC/D and BMP, Labs - Every other week:  ESR and CRP 70 Units 0  . ENTRESTO 97-103 MG TAKE 1 TABLET BY MOUTH TWICE A DAY. 60 tablet 3    . furosemide (LASIX) 40 MG tablet Take 1 tablet (40 mg total) by mouth daily as needed. Take as directed by clinic 30 tablet 3  . hydrALAZINE (APRESOLINE) 50 MG tablet Take 1 tablet (50 mg total) by mouth 2 (two) times daily. 60 tablet 6  . LOVENOX 100 MG/ML injection Inject 1 mL (100 mg total) into the skin every 12 (twelve) hours. 10 Syringe 1  . mexiletine (MEXITIL) 150 MG capsule Take 1 capsule (150 mg total) by mouth 2 (two) times daily. 60 capsule 5  . Multiple Vitamin (MULTIVITAMIN) tablet Take 1 tablet by mouth daily.    . pantoprazole (PROTONIX) 40 MG tablet TAKE 1 TABLET BY MOUTH EVERY DAY 90 tablet 3  . traMADol (ULTRAM) 50 MG tablet Take 1 tablet (50 mg total) by mouth every 8 (eight) hours as needed for moderate pain. 30 tablet 5  . warfarin (COUMADIN) 5 MG tablet Take 7.5 mg by mouth daily.     No current facility-administered medications for this encounter.     Metoprolol and Imdur [isosorbide nitrate]  REVIEW OF SYSTEMS: All systems negative except as listed in HPI, PMH and Problem list.   Vital signs: HR: 88 MAP BP: 94 modified systolic Auto cuff:  00/93 (84) O2 Sat: 100% Wt: Reported 242 pounds.  Ht:  6'2"  Physical Exam: GENERAL: Well appearing, male who presents to clinic today in no acute distress. HEENT: normal  NECK: Supple, JVP 7;  2+ bilaterally, no bruits.  No lymphadenopathy or thyromegaly appreciated.  CARDIAC:  Mechanical heart sounds with LVAD hum present.  LUNGS:  Clear to auscultation bilaterally.  ABDOMEN:  Soft, round, nontender, positive bowel sounds x4.     LVAD exit site: VAC dressing removed . Full thickness wound--> 8.5x1.5x3 cm 100% granulation. Driveline itself with multiple twists EXTREMITIES:  Warm and dry, no cyanosis, clubbing, rash. RUE PICC double lumen.   NEUROLOGIC:  Alert and oriented x 4.  Gait steady.  No aphasia.  No dysarthria.  Affect pleasant.     Recent Results (from the past 2160 hour(s))  Protime-INR     Status:  Abnormal   Collection Time: 12/26/15 11:53 AM  Result Value Ref Range   Prothrombin Time 30.4 (H) 11.4 - 15.2 seconds   INR 8.18   Basic metabolic panel     Status: Abnormal   Collection Time: 01/08/16  9:30 AM  Result Value Ref Range   Sodium 138 135 - 145 mmol/L   Potassium 3.4 (L) 3.5 - 5.1 mmol/L   Chloride 105 101 - 111 mmol/L   CO2 26 22 - 32 mmol/L   Glucose, Bld 90 65 - 99 mg/dL   BUN 7 6 - 20 mg/dL  Creatinine, Ser 1.09 0.61 - 1.24 mg/dL   Calcium 9.0 8.9 - 10.3 mg/dL   GFR calc non Af Amer >60 >60 mL/min   GFR calc Af Amer >60 >60 mL/min    Comment: (NOTE) The eGFR has been calculated using the CKD EPI equation. This calculation has not been validated in all clinical situations. eGFR's persistently <60 mL/min signify possible Chronic Kidney Disease.    Anion gap 7 5 - 15  CBC     Status: None   Collection Time: 01/08/16  9:30 AM  Result Value Ref Range   WBC 6.7 4.0 - 10.5 K/uL   RBC 5.11 4.22 - 5.81 MIL/uL   Hemoglobin 14.6 13.0 - 17.0 g/dL   HCT 44.3 39.0 - 52.0 %   MCV 86.7 78.0 - 100.0 fL   MCH 28.6 26.0 - 34.0 pg   MCHC 33.0 30.0 - 36.0 g/dL   RDW 14.6 11.5 - 15.5 %   Platelets 174 150 - 400 K/uL  Protime-INR     Status: Abnormal   Collection Time: 01/08/16  9:30 AM  Result Value Ref Range   Prothrombin Time 27.3 (H) 11.4 - 15.2 seconds   INR 2.48   Lactate dehydrogenase     Status: Abnormal   Collection Time: 01/08/16  9:30 AM  Result Value Ref Range   LDH 278 (H) 98 - 192 U/L  Protime-INR     Status: Abnormal   Collection Time: 01/28/16 10:18 AM  Result Value Ref Range   Prothrombin Time 27.1 (H) 11.4 - 15.2 seconds   INR 8.25   Basic metabolic panel     Status: None   Collection Time: 02/03/16 12:43 PM  Result Value Ref Range   Sodium 139 135 - 145 mmol/L   Potassium 4.0 3.5 - 5.1 mmol/L   Chloride 106 101 - 111 mmol/L   CO2 26 22 - 32 mmol/L   Glucose, Bld 90 65 - 99 mg/dL   BUN 12 6 - 20 mg/dL   Creatinine, Ser 1.10 0.61 - 1.24 mg/dL    Calcium 9.2 8.9 - 10.3 mg/dL   GFR calc non Af Amer >60 >60 mL/min   GFR calc Af Amer >60 >60 mL/min    Comment: (NOTE) The eGFR has been calculated using the CKD EPI equation. This calculation has not been validated in all clinical situations. eGFR's persistently <60 mL/min signify possible Chronic Kidney Disease.    Anion gap 7 5 - 15  CBC     Status: None   Collection Time: 02/03/16 12:43 PM  Result Value Ref Range   WBC 7.0 4.0 - 10.5 K/uL   RBC 5.03 4.22 - 5.81 MIL/uL   Hemoglobin 14.3 13.0 - 17.0 g/dL   HCT 43.5 39.0 - 52.0 %   MCV 86.5 78.0 - 100.0 fL   MCH 28.4 26.0 - 34.0 pg   MCHC 32.9 30.0 - 36.0 g/dL   RDW 15.2 11.5 - 15.5 %   Platelets 172 150 - 400 K/uL  B Nat Peptide     Status: None   Collection Time: 02/03/16 12:43 PM  Result Value Ref Range   B Natriuretic Peptide 66.1 0.0 - 100.0 pg/mL  INR/PT     Status: Abnormal   Collection Time: 02/03/16 12:43 PM  Result Value Ref Range   Prothrombin Time 24.5 (H) 11.4 - 15.2 seconds   INR 2.17   Aerobic Culture (superficial specimen)     Status: None   Collection Time: 02/03/16  3:32  PM  Result Value Ref Range   Specimen Description WOUND ABDOMEN    Special Requests DRIVE LINE SITE    Gram Stain      FEW WBC PRESENT, PREDOMINANTLY PMN NO ORGANISMS SEEN    Culture RARE SERRATIA SPECIES    Report Status 02/06/2016 FINAL    Organism ID, Bacteria SERRATIA SPECIES       Susceptibility   Serratia species - MIC*    CEFAZOLIN >=64 RESISTANT Resistant     CEFEPIME <=1 SENSITIVE Sensitive     CEFTAZIDIME <=1 SENSITIVE Sensitive     CEFTRIAXONE <=1 SENSITIVE Sensitive     CIPROFLOXACIN 1 SENSITIVE Sensitive     GENTAMICIN <=1 SENSITIVE Sensitive     TRIMETH/SULFA 40 SENSITIVE Sensitive     * RARE SERRATIA SPECIES  TSH     Status: None   Collection Time: 02/05/16  3:36 PM  Result Value Ref Range   TSH 1.422 0.350 - 4.500 uIU/mL    Comment: Performed by a 3rd Generation assay with a functional sensitivity of <=0.01  uIU/mL.  T4, free     Status: None   Collection Time: 02/05/16  3:36 PM  Result Value Ref Range   Free T4 0.95 0.61 - 1.12 ng/dL    Comment: (NOTE) Biotin ingestion may interfere with free T4 tests. If the results are inconsistent with the TSH level, previous test results, or the clinical presentation, then consider biotin interference. If needed, order repeat testing after stopping biotin.   T3, free     Status: None   Collection Time: 02/05/16  3:36 PM  Result Value Ref Range   T3, Free 2.6 2.0 - 4.4 pg/mL    Comment: (NOTE) Performed At: [facility] [place], [place] [id number] Lindon Romp MD GY:[id number]   Lactate dehydrogenase     Status: Abnormal   Collection Time: 02/13/16 10:49 AM  Result Value Ref Range   LDH 209 (H) 98 - 192 U/L  Protime-INR     Status: Abnormal   Collection Time: 02/13/16 10:49 AM  Result Value Ref Range   Prothrombin Time 20.2 (H) 11.4 - 15.2 seconds   INR 1.70   CBC with Differential     Status: Abnormal   Collection Time: 02/13/16 10:49 AM  Result Value Ref Range   WBC 6.5 4.0 - 10.5 K/uL   RBC 4.70 4.22 - 5.81 MIL/uL   Hemoglobin 13.2 13.0 - 17.0 g/dL   HCT 40.5 39.0 - 52.0 %   MCV 86.2 78.0 - 100.0 fL   MCH 28.1 26.0 - 34.0 pg   MCHC 32.6 30.0 - 36.0 g/dL   RDW 15.0 11.5 - 15.5 %   Platelets 133 (L) 150 - 400 K/uL   Neutrophils Relative % 67 %   Neutro Abs 4.3 1.7 - 7.7 K/uL   Lymphocytes Relative 24 %   Lymphs Abs 1.6 0.7 - 4.0 K/uL   Monocytes Relative 8 %   Monocytes Absolute 0.5 0.1 - 1.0 K/uL   Eosinophils Relative 1 %   Eosinophils Absolute 0.1 0.0 - 0.7 K/uL   Basophils Relative 0 %   Basophils Absolute 0.0 0.0 - 0.1 K/uL  INR/PT     Status: Abnormal   Collection Time: 02/20/16 10:22 AM  Result Value Ref Range   Prothrombin Time 29.2 (H) 11.4 - 15.2 seconds   INR 2.69   CULTURE, ROUTINE-ABSCESS     Status: None   Collection Time: 02/25/16  3:30 PM  Result Value  Ref Range   Culture Moderate  SERRATIA MARCESCENS    Gram Stain Moderate    Gram Stain WBC present-both PMN and Mononuclear    Gram Stain No Squamous Epithelial Cells Seen    Gram Stain No Organisms Seen    Organism ID, Bacteria SERRATIA MARCESCENS       Susceptibility   Serratia marcescens -  (no method available)    AMOX/CLAVULANIC  Resistant     CEFAZOLIN >=64 Resistant     CEFTRIAXONE <=1 Sensitive     CEFTAZIDIME <=1 Sensitive     CEFEPIME <=1 Sensitive     GENTAMICIN <=1 Sensitive     TOBRAMYCIN <=1 Sensitive     CIPROFLOXACIN 2 Intermediate     LEVOFLOXACIN 4 Intermediate     TRIMETH/SULFA* <=20 Sensitive      * NR=NOT REPORTABLE,SEE COMMENTORAL therapy:A cefazolin MIC of <32 predicts susceptibility to the oral agents cefaclor,cefdinir,cefpodoxime,cefprozil,cefuroxime,cephalexin,and loracarbef when used for therapy of uncomplicated UTIs due to E.coli,K.pneumomiae,and P.mirabilis. PARENTERAL therapy: A cefazolinMIC of >8 indicates resistance to parenteralcefazolin. An alternate test method must beperformed to confirm susceptibility to parenteralcefazolin.  INR/PT     Status: Abnormal   Collection Time: 02/25/16  3:53 PM  Result Value Ref Range   Prothrombin Time 28.6 (H) 11.4 - 15.2 seconds   INR 2.62   Protime-INR     Status: Abnormal   Collection Time: 03/09/16  6:11 PM  Result Value Ref Range   Prothrombin Time 21.1 (H) 11.4 - 15.2 seconds   INR 1.80   Lactate dehydrogenase     Status: Abnormal   Collection Time: 03/09/16  6:11 PM  Result Value Ref Range   LDH 224 (H) 98 - 192 U/L  MRSA PCR Screening     Status: None   Collection Time: 03/09/16  6:23 PM  Result Value Ref Range   MRSA by PCR NEGATIVE NEGATIVE    Comment:        The GeneXpert MRSA Assay (FDA approved for NASAL specimens only), is one component of a comprehensive MRSA colonization surveillance program. It is not intended to diagnose MRSA infection nor to guide or monitor treatment for MRSA infections.   Heparin level  (unfractionated)     Status: None   Collection Time: 03/10/16  4:32 AM  Result Value Ref Range   Heparin Unfractionated 0.48 0.30 - 0.70 IU/mL    Comment:        IF HEPARIN RESULTS ARE BELOW EXPECTED VALUES, AND PATIENT DOSAGE HAS BEEN CONFIRMED, SUGGEST FOLLOW UP TESTING OF ANTITHROMBIN III LEVELS.   Basic metabolic panel     Status: Abnormal   Collection Time: 03/10/16  4:32 AM  Result Value Ref Range   Sodium 138 135 - 145 mmol/L   Potassium 3.6 3.5 - 5.1 mmol/L   Chloride 105 101 - 111 mmol/L   CO2 27 22 - 32 mmol/L   Glucose, Bld 88 65 - 99 mg/dL   BUN 9 6 - 20 mg/dL   Creatinine, Ser 1.00 0.61 - 1.24 mg/dL   Calcium 8.5 (L) 8.9 - 10.3 mg/dL   GFR calc non Af Amer >60 >60 mL/min   GFR calc Af Amer >60 >60 mL/min    Comment: (NOTE) The eGFR has been calculated using the CKD EPI equation. This calculation has not been validated in all clinical situations. eGFR's persistently <60 mL/min signify possible Chronic Kidney Disease.    Anion gap 6 5 - 15  CBC     Status: Abnormal  Collection Time: 03/10/16  4:32 AM  Result Value Ref Range   WBC 6.4 4.0 - 10.5 K/uL   RBC 4.60 4.22 - 5.81 MIL/uL   Hemoglobin 13.1 13.0 - 17.0 g/dL   HCT 39.1 39.0 - 52.0 %   MCV 85.0 78.0 - 100.0 fL   MCH 28.5 26.0 - 34.0 pg   MCHC 33.5 30.0 - 36.0 g/dL   RDW 15.0 11.5 - 15.5 %   Platelets 146 (L) 150 - 400 K/uL  Lactate dehydrogenase     Status: Abnormal   Collection Time: 03/10/16  4:32 AM  Result Value Ref Range   LDH 194 (H) 98 - 192 U/L  Protime-INR     Status: Abnormal   Collection Time: 03/10/16  4:32 AM  Result Value Ref Range   Prothrombin Time 19.6 (H) 11.4 - 15.2 seconds   INR 1.63   Heparin level (unfractionated)     Status: Abnormal   Collection Time: 03/11/16  3:23 AM  Result Value Ref Range   Heparin Unfractionated 0.85 (H) 0.30 - 0.70 IU/mL    Comment:        IF HEPARIN RESULTS ARE BELOW EXPECTED VALUES, AND PATIENT DOSAGE HAS BEEN CONFIRMED, SUGGEST FOLLOW UP  TESTING OF ANTITHROMBIN III LEVELS.   CBC     Status: None   Collection Time: 03/11/16  3:23 AM  Result Value Ref Range   WBC 7.9 4.0 - 10.5 K/uL   RBC 5.03 4.22 - 5.81 MIL/uL   Hemoglobin 14.2 13.0 - 17.0 g/dL   HCT 42.6 39.0 - 52.0 %   MCV 84.7 78.0 - 100.0 fL   MCH 28.2 26.0 - 34.0 pg   MCHC 33.3 30.0 - 36.0 g/dL   RDW 15.1 11.5 - 15.5 %   Platelets 180 150 - 400 K/uL  Lactate dehydrogenase     Status: Abnormal   Collection Time: 03/11/16  3:23 AM  Result Value Ref Range   LDH 217 (H) 98 - 192 U/L  Protime-INR     Status: Abnormal   Collection Time: 03/11/16  3:23 AM  Result Value Ref Range   Prothrombin Time 16.3 (H) 11.4 - 15.2 seconds   INR 1.30   Heparin level (unfractionated)     Status: None   Collection Time: 03/11/16 10:56 AM  Result Value Ref Range   Heparin Unfractionated 0.62 0.30 - 0.70 IU/mL    Comment:        IF HEPARIN RESULTS ARE BELOW EXPECTED VALUES, AND PATIENT DOSAGE HAS BEEN CONFIRMED, SUGGEST FOLLOW UP TESTING OF ANTITHROMBIN III LEVELS.   Type and screen [place]     Status: None   Collection Time: 03/11/16  8:58 PM  Result Value Ref Range   ABO/RH(D) O POS    Antibody Screen NEG    Sample Expiration 03/14/2016   Surgical PCR screen     Status: None   Collection Time: 03/11/16 11:40 PM  Result Value Ref Range   MRSA, PCR NEGATIVE NEGATIVE   Staphylococcus aureus NEGATIVE NEGATIVE    Comment:        The Xpert SA Assay (FDA approved for NASAL specimens in patients over 73 years of age), is one component of a comprehensive surveillance program.  Test performance has been validated by [facility] for patients greater than or equal to 57 year old. It is not intended to diagnose infection nor to guide or monitor treatment.   Lactate dehydrogenase     Status: None  Collection Time: 03/12/16  2:13 AM  Result Value Ref Range   LDH 176 98 - 192 U/L  Protime-INR     Status: Abnormal   Collection Time: 03/12/16  2:13  AM  Result Value Ref Range   Prothrombin Time 15.5 (H) 11.4 - 15.2 seconds   INR 1.22   CBC     Status: None   Collection Time: 03/12/16  2:13 AM  Result Value Ref Range   WBC 7.5 4.0 - 10.5 K/uL   RBC 4.71 4.22 - 5.81 MIL/uL   Hemoglobin 13.4 13.0 - 17.0 g/dL   HCT 40.2 39.0 - 52.0 %   MCV 85.4 78.0 - 100.0 fL   MCH 28.5 26.0 - 34.0 pg   MCHC 33.3 30.0 - 36.0 g/dL   RDW 15.1 11.5 - 15.5 %   Platelets 161 150 - 400 K/uL  APTT     Status: Abnormal   Collection Time: 03/12/16  2:13 AM  Result Value Ref Range   aPTT 140 (H) 24 - 36 seconds    Comment:        IF BASELINE aPTT IS ELEVATED, SUGGEST PATIENT RISK ASSESSMENT BE USED TO DETERMINE APPROPRIATE ANTICOAGULANT THERAPY.   Blood gas, arterial     Status: None   Collection Time: 03/12/16  3:26 AM  Result Value Ref Range   pH, Arterial 7.404 7.350 - 7.450   pCO2 arterial 40.7 32.0 - 48.0 mmHg   pO2, Arterial 84.2 83.0 - 108.0 mmHg   Bicarbonate 24.9 20.0 - 28.0 mmol/L   Acid-Base Excess 0.7 0.0 - 2.0 mmol/L   O2 Saturation 95.8 %   Patient temperature 98.6    Collection site RIGHT RADIAL    Drawn by [phone]    Sample type ARTERIAL DRAW    Allens test (pass/fail) PASS PASS  Urinalysis, Routine w reflex microscopic     Status: None   Collection Time: 03/12/16  5:21 AM  Result Value Ref Range   Color, Urine YELLOW YELLOW   APPearance CLEAR CLEAR   Specific Gravity, Urine 1.011 1.005 - 1.030   pH 6.0 5.0 - 8.0   Glucose, UA NEGATIVE NEGATIVE mg/dL   Hgb urine dipstick NEGATIVE NEGATIVE   Bilirubin Urine NEGATIVE NEGATIVE   Ketones, ur NEGATIVE NEGATIVE mg/dL   Protein, ur NEGATIVE NEGATIVE mg/dL   Nitrite NEGATIVE NEGATIVE   Leukocytes, UA NEGATIVE NEGATIVE  Anaerobic culture     Status: None   Collection Time: 03/12/16  8:18 AM  Result Value Ref Range   Specimen Description WOUND ABDOMEN    Special Requests SPEC A    Culture NO ANAEROBES ISOLATED    Report Status 03/17/2016 FINAL   Aerobic Culture (superficial  specimen)     Status: None   Collection Time: 03/12/16  8:18 AM  Result Value Ref Range   Specimen Description WOUND ABDOMEN    Special Requests SPEC A    Gram Stain      ABUNDANT WBC PRESENT, PREDOMINANTLY PMN NO ORGANISMS SEEN    Culture      FEW SERRATIA MARCESCENS CRITICAL RESULT CALLED TO, READ BACK BY AND VERIFIED WITH: RN K.JOHNSON [id number] MLM    Report Status 03/14/2016 FINAL    Organism ID, Bacteria SERRATIA MARCESCENS       Susceptibility   Serratia marcescens - MIC*    CEFAZOLIN >=64 RESISTANT Resistant     CEFEPIME <=1 SENSITIVE Sensitive     CEFTAZIDIME <=1 SENSITIVE Sensitive     CEFTRIAXONE <=1 SENSITIVE  Sensitive     CIPROFLOXACIN 2 INTERMEDIATE Intermediate     GENTAMICIN <=1 SENSITIVE Sensitive     TRIMETH/SULFA <=20 SENSITIVE Sensitive     * FEW SERRATIA MARCESCENS  Aerobic/Anaerobic Culture (surgical/deep wound)     Status: None   Collection Time: 03/12/16  8:24 AM  Result Value Ref Range   Specimen Description TISSUE ABDOMEN    Special Requests SPEC B    Gram Stain      FEW WBC PRESENT,BOTH PMN AND MONONUCLEAR NO ORGANISMS SEEN    Culture      FEW SERRATIA MARCESCENS CRITICAL RESULT CALLED TO, READ BACK BY AND VERIFIED WITH: RN K.JOHNSON [id number] MLM NO ANAEROBES ISOLATED    Report Status 03/17/2016 FINAL    Organism ID, Bacteria SERRATIA MARCESCENS       Susceptibility   Serratia marcescens - MIC*    CEFAZOLIN >=64 RESISTANT Resistant     CEFEPIME <=1 SENSITIVE Sensitive     CEFTAZIDIME <=1 SENSITIVE Sensitive     CEFTRIAXONE <=1 SENSITIVE Sensitive     CIPROFLOXACIN 2 INTERMEDIATE Intermediate     GENTAMICIN <=1 SENSITIVE Sensitive     TRIMETH/SULFA <=20 SENSITIVE Sensitive     * FEW SERRATIA MARCESCENS  Heparin level (unfractionated)     Status: None   Collection Time: 03/12/16  9:34 PM  Result Value Ref Range   Heparin Unfractionated 0.30 0.30 - 0.70 IU/mL    Comment:        IF HEPARIN RESULTS ARE BELOW EXPECTED VALUES, AND  PATIENT DOSAGE HAS BEEN CONFIRMED, SUGGEST FOLLOW UP TESTING OF ANTITHROMBIN III LEVELS.   Lactate dehydrogenase     Status: None   Collection Time: 03/13/16  1:35 AM  Result Value Ref Range   LDH 179 98 - 192 U/L  Protime-INR     Status: Abnormal   Collection Time: 03/13/16  1:35 AM  Result Value Ref Range   Prothrombin Time 15.4 (H) 11.4 - 15.2 seconds   INR 1.22   CBC     Status: Abnormal   Collection Time: 03/13/16  1:35 AM  Result Value Ref Range   WBC 8.4 4.0 - 10.5 K/uL   RBC 4.70 4.22 - 5.81 MIL/uL   Hemoglobin 13.3 13.0 - 17.0 g/dL   HCT 40.0 39.0 - 52.0 %   MCV 85.1 78.0 - 100.0 fL   MCH 28.3 26.0 - 34.0 pg   MCHC 33.3 30.0 - 36.0 g/dL   RDW 15.4 11.5 - 15.5 %   Platelets 148 (L) 150 - 400 K/uL  Basic metabolic panel     Status: Abnormal   Collection Time: 03/13/16  1:35 AM  Result Value Ref Range   Sodium 134 (L) 135 - 145 mmol/L   Potassium 3.7 3.5 - 5.1 mmol/L   Chloride 101 101 - 111 mmol/L   CO2 26 22 - 32 mmol/L   Glucose, Bld 112 (H) 65 - 99 mg/dL   BUN 9 6 - 20 mg/dL   Creatinine, Ser 1.13 0.61 - 1.24 mg/dL   Calcium 8.6 (L) 8.9 - 10.3 mg/dL   GFR calc non Af Amer >60 >60 mL/min   GFR calc Af Amer >60 >60 mL/min    Comment: (NOTE) The eGFR has been calculated using the CKD EPI equation. This calculation has not been validated in all clinical situations. eGFR's persistently <60 mL/min signify possible Chronic Kidney Disease.    Anion gap 7 5 - 15  Heparin level (unfractionated)     Status: Abnormal  Collection Time: 03/13/16  8:04 AM  Result Value Ref Range   Heparin Unfractionated 0.25 (L) 0.30 - 0.70 IU/mL    Comment:        IF HEPARIN RESULTS ARE BELOW EXPECTED VALUES, AND PATIENT DOSAGE HAS BEEN CONFIRMED, SUGGEST FOLLOW UP TESTING OF ANTITHROMBIN III LEVELS.   Heparin level (unfractionated)     Status: Abnormal   Collection Time: 03/13/16 10:36 PM  Result Value Ref Range   Heparin Unfractionated 0.20 (L) 0.30 - 0.70 IU/mL    Comment:         IF HEPARIN RESULTS ARE BELOW EXPECTED VALUES, AND PATIENT DOSAGE HAS BEEN CONFIRMED, SUGGEST FOLLOW UP TESTING OF ANTITHROMBIN III LEVELS.   Lactate dehydrogenase     Status: None   Collection Time: 03/14/16  2:02 AM  Result Value Ref Range   LDH 192 98 - 192 U/L  Protime-INR     Status: Abnormal   Collection Time: 03/14/16  2:02 AM  Result Value Ref Range   Prothrombin Time 15.4 (H) 11.4 - 15.2 seconds   INR 1.21   CBC     Status: Abnormal   Collection Time: 03/14/16  2:02 AM  Result Value Ref Range   WBC 8.9 4.0 - 10.5 K/uL   RBC 4.63 4.22 - 5.81 MIL/uL   Hemoglobin 13.1 13.0 - 17.0 g/dL   HCT 40.0 39.0 - 52.0 %   MCV 86.4 78.0 - 100.0 fL   MCH 28.3 26.0 - 34.0 pg   MCHC 32.8 30.0 - 36.0 g/dL   RDW 15.4 11.5 - 15.5 %   Platelets 149 (L) 150 - 400 K/uL  Comprehensive metabolic panel     Status: Abnormal   Collection Time: 03/14/16  2:02 AM  Result Value Ref Range   Sodium 135 135 - 145 mmol/L   Potassium 3.9 3.5 - 5.1 mmol/L   Chloride 102 101 - 111 mmol/L   CO2 22 22 - 32 mmol/L   Glucose, Bld 95 65 - 99 mg/dL   BUN 12 6 - 20 mg/dL   Creatinine, Ser 1.21 0.61 - 1.24 mg/dL   Calcium 8.8 (L) 8.9 - 10.3 mg/dL   Total Protein 6.5 6.5 - 8.1 g/dL   Albumin 3.6 3.5 - 5.0 g/dL   AST 15 15 - 41 U/L   ALT 13 (L) 17 - 63 U/L   Alkaline Phosphatase 79 38 - 126 U/L   Total Bilirubin 0.6 0.3 - 1.2 mg/dL   GFR calc non Af Amer >60 >60 mL/min   GFR calc Af Amer >60 >60 mL/min    Comment: (NOTE) The eGFR has been calculated using the CKD EPI equation. This calculation has not been validated in all clinical situations. eGFR's persistently <60 mL/min signify possible Chronic Kidney Disease.    Anion gap 11 5 - 15  Heparin level (unfractionated)     Status: Abnormal   Collection Time: 03/14/16  2:02 AM  Result Value Ref Range   Heparin Unfractionated 0.17 (L) 0.30 - 0.70 IU/mL    Comment:        IF HEPARIN RESULTS ARE BELOW EXPECTED VALUES, AND PATIENT DOSAGE HAS  BEEN CONFIRMED, SUGGEST FOLLOW UP TESTING OF ANTITHROMBIN III LEVELS.   Lactate dehydrogenase     Status: None   Collection Time: 03/15/16  3:53 AM  Result Value Ref Range   LDH 174 98 - 192 U/L  CBC     Status: Abnormal   Collection Time: 03/15/16  3:53 AM  Result Value  Ref Range   WBC 7.9 4.0 - 10.5 K/uL   RBC 4.46 4.22 - 5.81 MIL/uL   Hemoglobin 12.8 (L) 13.0 - 17.0 g/dL   HCT 38.5 (L) 39.0 - 52.0 %   MCV 86.3 78.0 - 100.0 fL   MCH 28.7 26.0 - 34.0 pg   MCHC 33.2 30.0 - 36.0 g/dL   RDW 15.6 (H) 11.5 - 15.5 %   Platelets 145 (L) 150 - 400 K/uL  Basic metabolic panel     Status: Abnormal   Collection Time: 03/15/16  3:53 AM  Result Value Ref Range   Sodium 136 135 - 145 mmol/L   Potassium 4.0 3.5 - 5.1 mmol/L   Chloride 105 101 - 111 mmol/L   CO2 24 22 - 32 mmol/L   Glucose, Bld 91 65 - 99 mg/dL   BUN 10 6 - 20 mg/dL   Creatinine, Ser 1.02 0.61 - 1.24 mg/dL   Calcium 8.8 (L) 8.9 - 10.3 mg/dL   GFR calc non Af Amer >60 >60 mL/min   GFR calc Af Amer >60 >60 mL/min    Comment: (NOTE) The eGFR has been calculated using the CKD EPI equation. This calculation has not been validated in all clinical situations. eGFR's persistently <60 mL/min signify possible Chronic Kidney Disease.    Anion gap 7 5 - 15  Heparin level (unfractionated)     Status: Abnormal   Collection Time: 03/15/16  4:00 AM  Result Value Ref Range   Heparin Unfractionated 0.13 (L) 0.30 - 0.70 IU/mL    Comment:        IF HEPARIN RESULTS ARE BELOW EXPECTED VALUES, AND PATIENT DOSAGE HAS BEEN CONFIRMED, SUGGEST FOLLOW UP TESTING OF ANTITHROMBIN III LEVELS.   Protime-INR     Status: Abnormal   Collection Time: 03/15/16  4:00 AM  Result Value Ref Range   Prothrombin Time 15.4 (H) 11.4 - 15.2 seconds   INR 1.22   Lactate dehydrogenase     Status: None   Collection Time: 03/16/16  2:09 AM  Result Value Ref Range   LDH 179 98 - 192 U/L  CBC     Status: Abnormal   Collection Time: 03/16/16  2:09 AM    Result Value Ref Range   WBC 7.9 4.0 - 10.5 K/uL   RBC 4.37 4.22 - 5.81 MIL/uL   Hemoglobin 12.4 (L) 13.0 - 17.0 g/dL   HCT 37.8 (L) 39.0 - 52.0 %   MCV 86.5 78.0 - 100.0 fL   MCH 28.4 26.0 - 34.0 pg   MCHC 32.8 30.0 - 36.0 g/dL   RDW 15.8 (H) 11.5 - 15.5 %   Platelets 137 (L) 150 - 400 K/uL  Basic metabolic panel     Status: Abnormal   Collection Time: 03/16/16  2:09 AM  Result Value Ref Range   Sodium 137 135 - 145 mmol/L   Potassium 4.3 3.5 - 5.1 mmol/L   Chloride 105 101 - 111 mmol/L   CO2 25 22 - 32 mmol/L   Glucose, Bld 88 65 - 99 mg/dL   BUN 10 6 - 20 mg/dL   Creatinine, Ser 1.03 0.61 - 1.24 mg/dL   Calcium 8.8 (L) 8.9 - 10.3 mg/dL   GFR calc non Af Amer >60 >60 mL/min   GFR calc Af Amer >60 >60 mL/min    Comment: (NOTE) The eGFR has been calculated using the CKD EPI equation. This calculation has not been validated in all clinical situations. eGFR's persistently <60 mL/min signify  possible Chronic Kidney Disease.    Anion gap 7 5 - 15  Heparin level (unfractionated)     Status: Abnormal   Collection Time: 03/16/16  2:09 AM  Result Value Ref Range   Heparin Unfractionated <0.10 (L) 0.30 - 0.70 IU/mL    Comment:        IF HEPARIN RESULTS ARE BELOW EXPECTED VALUES, AND PATIENT DOSAGE HAS BEEN CONFIRMED, SUGGEST FOLLOW UP TESTING OF ANTITHROMBIN III LEVELS.   Protime-INR     Status: Abnormal   Collection Time: 03/16/16  2:09 AM  Result Value Ref Range   Prothrombin Time 16.2 (H) 11.4 - 15.2 seconds   INR 1.29   Lactate dehydrogenase     Status: None   Collection Time: 03/17/16  4:20 AM  Result Value Ref Range   LDH 168 98 - 192 U/L  Protime-INR     Status: Abnormal   Collection Time: 03/17/16  4:20 AM  Result Value Ref Range   Prothrombin Time 16.9 (H) 11.4 - 15.2 seconds   INR 1.36   CBC     Status: Abnormal   Collection Time: 03/17/16  4:20 AM  Result Value Ref Range   WBC 8.1 4.0 - 10.5 K/uL   RBC 4.55 4.22 - 5.81 MIL/uL   Hemoglobin 12.7 (L) 13.0  - 17.0 g/dL   HCT 39.7 39.0 - 52.0 %   MCV 87.3 78.0 - 100.0 fL   MCH 27.9 26.0 - 34.0 pg   MCHC 32.0 30.0 - 36.0 g/dL   RDW 15.8 (H) 11.5 - 15.5 %   Platelets 138 (L) 150 - 400 K/uL  Basic metabolic panel     Status: None   Collection Time: 03/17/16  4:20 AM  Result Value Ref Range   Sodium 138 135 - 145 mmol/L   Potassium 4.0 3.5 - 5.1 mmol/L   Chloride 105 101 - 111 mmol/L   CO2 25 22 - 32 mmol/L   Glucose, Bld 93 65 - 99 mg/dL   BUN 12 6 - 20 mg/dL   Creatinine, Ser 0.95 0.61 - 1.24 mg/dL   Calcium 8.9 8.9 - 10.3 mg/dL   GFR calc non Af Amer >60 >60 mL/min   GFR calc Af Amer >60 >60 mL/min    Comment: (NOTE) The eGFR has been calculated using the CKD EPI equation. This calculation has not been validated in all clinical situations. eGFR's persistently <60 mL/min signify possible Chronic Kidney Disease.    Anion gap 8 5 - 15  Heparin level (unfractionated)     Status: Abnormal   Collection Time: 03/17/16  4:20 AM  Result Value Ref Range   Heparin Unfractionated 0.17 (L) 0.30 - 0.70 IU/mL    Comment:        IF HEPARIN RESULTS ARE BELOW EXPECTED VALUES, AND PATIENT DOSAGE HAS BEEN CONFIRMED, SUGGEST FOLLOW UP TESTING OF ANTITHROMBIN III LEVELS.   Heparin level (unfractionated)     Status: Abnormal   Collection Time: 03/18/16  3:57 AM  Result Value Ref Range   Heparin Unfractionated 0.22 (L) 0.30 - 0.70 IU/mL    Comment:        IF HEPARIN RESULTS ARE BELOW EXPECTED VALUES, AND PATIENT DOSAGE HAS BEEN CONFIRMED, SUGGEST FOLLOW UP TESTING OF ANTITHROMBIN III LEVELS.   CBC     Status: Abnormal   Collection Time: 03/18/16  3:57 AM  Result Value Ref Range   WBC 7.3 4.0 - 10.5 K/uL   RBC 4.58 4.22 - 5.81 MIL/uL  Hemoglobin 13.1 13.0 - 17.0 g/dL   HCT 39.5 39.0 - 52.0 %   MCV 86.2 78.0 - 100.0 fL   MCH 28.6 26.0 - 34.0 pg   MCHC 33.2 30.0 - 36.0 g/dL   RDW 15.8 (H) 11.5 - 15.5 %   Platelets 135 (L) 150 - 400 K/uL  Lactate dehydrogenase     Status: None    Collection Time: 03/18/16  3:57 AM  Result Value Ref Range   LDH 171 98 - 192 U/L  Protime-INR     Status: Abnormal   Collection Time: 03/18/16  3:57 AM  Result Value Ref Range   Prothrombin Time 18.0 (H) 11.4 - 15.2 seconds   INR 1.47   Lactate dehydrogenase     Status: None   Collection Time: 03/19/16  5:00 AM  Result Value Ref Range   LDH 179 98 - 192 U/L  Protime-INR     Status: Abnormal   Collection Time: 03/19/16  5:00 AM  Result Value Ref Range   Prothrombin Time 19.8 (H) 11.4 - 15.2 seconds   INR 1.66   CBC     Status: Abnormal   Collection Time: 03/19/16  5:00 AM  Result Value Ref Range   WBC 8.3 4.0 - 10.5 K/uL   RBC 4.71 4.22 - 5.81 MIL/uL   Hemoglobin 13.2 13.0 - 17.0 g/dL   HCT 41.1 39.0 - 52.0 %   MCV 87.3 78.0 - 100.0 fL   MCH 28.0 26.0 - 34.0 pg   MCHC 32.1 30.0 - 36.0 g/dL   RDW 15.9 (H) 11.5 - 15.5 %   Platelets 147 (L) 150 - 400 K/uL  Heparin level (unfractionated)     Status: Abnormal   Collection Time: 03/19/16  5:00 AM  Result Value Ref Range   Heparin Unfractionated 0.22 (L) 0.30 - 0.70 IU/mL    Comment:        IF HEPARIN RESULTS ARE BELOW EXPECTED VALUES, AND PATIENT DOSAGE HAS BEEN CONFIRMED, SUGGEST FOLLOW UP TESTING OF ANTITHROMBIN III LEVELS.   Lactate dehydrogenase     Status: Abnormal   Collection Time: 03/20/16  4:13 AM  Result Value Ref Range   LDH 195 (H) 98 - 192 U/L  Protime-INR     Status: Abnormal   Collection Time: 03/20/16  4:13 AM  Result Value Ref Range   Prothrombin Time 22.0 (H) 11.4 - 15.2 seconds   INR 1.90   CBC     Status: Abnormal   Collection Time: 03/20/16  4:13 AM  Result Value Ref Range   WBC 7.7 4.0 - 10.5 K/uL   RBC 4.82 4.22 - 5.81 MIL/uL   Hemoglobin 13.5 13.0 - 17.0 g/dL   HCT 41.4 39.0 - 52.0 %   MCV 85.9 78.0 - 100.0 fL   MCH 28.0 26.0 - 34.0 pg   MCHC 32.6 30.0 - 36.0 g/dL   RDW 15.7 (H) 11.5 - 15.5 %   Platelets 161 150 - 400 K/uL  Heparin level (unfractionated)     Status: Abnormal   Collection  Time: 03/20/16  4:13 AM  Result Value Ref Range   Heparin Unfractionated 0.25 (L) 0.30 - 0.70 IU/mL    Comment:        IF HEPARIN RESULTS ARE BELOW EXPECTED VALUES, AND PATIENT DOSAGE HAS BEEN CONFIRMED, SUGGEST FOLLOW UP TESTING OF ANTITHROMBIN III LEVELS.   Basic metabolic panel     Status: Abnormal   Collection Time: 03/20/16  4:13 AM  Result Value Ref  Range   Sodium 136 135 - 145 mmol/L   Potassium 4.2 3.5 - 5.1 mmol/L   Chloride 103 101 - 111 mmol/L   CO2 20 (L) 22 - 32 mmol/L   Glucose, Bld 85 65 - 99 mg/dL   BUN 13 6 - 20 mg/dL   Creatinine, Ser 1.02 0.61 - 1.24 mg/dL   Calcium 9.2 8.9 - 10.3 mg/dL   GFR calc non Af Amer >60 >60 mL/min   GFR calc Af Amer >60 >60 mL/min    Comment: (NOTE) The eGFR has been calculated using the CKD EPI equation. This calculation has not been validated in all clinical situations. eGFR's persistently <60 mL/min signify possible Chronic Kidney Disease.    Anion gap 13 5 - 15  POCT INR     Status: None   Collection Time: 03/23/16 12:00 AM  Result Value Ref Range   INR 1.6     ASSESSMENT AND PLAN:   1) Chronic systolic HF, s/p HMII LVAD implant 05/2012.  - He is doing well. NYHA I-II. - Listed 1A at [facility]. Has follow up at [facility] tomorrow.   - He is not on a BB. He was able to tolerate low-doseToprol before LVAD, however has been intolerant since that time. Volume status stable.  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 100% granulation. No odor. Applied 1 gram A Cell, mepitel, VAC 125 mm hg. Plan to change again next week. May need cut back pressure to 75 mm hg if bleeding occurs after dressing change.  Deep wound culture showed Serratia again. Continue ceftriaxone 2 grams every 12 hours per ID. Plan for ceftriaxone at least 6 wks.  - He will follow up with Dr Johnnye Sima the end of January.   3) HTN-  - MAP stable. Continue current dose of entresto, amlodipine, and  Hydralazine.   improved  with Entresto 97/03 bid.  3) Hyperthyroidism - Amio induced. Follows with Dr. Forde Dandy. Amio stopped. Recent TSH 02/2016 1.422   4) NSVT/VT  - Off amio. On mexilitene. - ICD now at EOL. 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  6) Anticoagulation with coumadin  - Continue coumadin and ASA 325 mg for LVAD. 7) LVAD  - Stable. Parameters stable.   Labs today and adjust accordingly.  Follow up next week for dressing change. Today we will fax the office note to [facility].   Dajanae Brophy NP-C  2:22 PM

## 2016-03-24 NOTE — Patient Instructions (Signed)
[facility] Appointment  Address: [address], [place], [place] [address]  Phone: [phone]

## 2016-03-24 NOTE — Progress Notes (Signed)
Patient presents for follow up in VAD Clinic today. Reports no problems with VAD equipment. Has an appointment with [facility] tomorrow for evaluation for 1A status.   Vital Signs:  Doppler Pressure: 98 Automatc BP: 98/75 (82) HR:  87 SPO2: 100 %  VAD Indication: Bridge to Transplant - Evaluation completed at [facility] with IB listing 05/30/2013; dual listed with [facility] 12/04/2014.   1A at [facility] recently and will also be listed at [facility] 1A tomorrow for duration of antibiotics.    VAD interrogation & Equipment Management: Speed: 9600 Flow: 5.3 Power: 6.0 w    PI: 6.3  Alarms: no clinical alarms  Events: 5 - 10 daily   Fixed speed: 9600 Low speed limit: 9000  Primary Controller:  Replace back up battery in 17 months. Back up controller:   Replace back up battery in 16 months.  Annual Equipment Maintenance on UBC/PM was performed on 06/19/2015.   I reviewed the LVAD parameters from today and compared the results to the patient's prior recorded data with Tonye Becket, NP. LVAD interrogation was NEGATIVE for significant power changes, NEGATIVE for clinical alarms and STABLE for PI events/speed drops. No programming changes were made and pump is functioning within specified parameters. Pt is performing daily controller and system monitor self tests along with completing weekly and monthly maintenance for LVAD equipment.  LVAD equipment check completed and is in good working order. Back-up equipment present. Charged back up battery and performed self-test on equipment.   Exit Site Care: Drive line has recently been debrided by Dr. Donata Clay and he is here for wound care and re-application of Acell powder and VAC dressing change.     Significant Events on VAD Support:  08/26/15 >> Serratia marcescens (rare) >> Cipro 500 mg bid x 7 days  09/29/15 >> Serratia marcescens (abundant) >> Cipro 500 mg BID x14d 11/20/15 >> Serratia marcescens (few) Admitted IV ceftriaxone >> cipro 500 mg twice daily x 30  days 02/03/16 >> Serratia species >> Cipro 500 mg BID x 14d; referral to ID 02/13/16  03/11/2016 >> driveline debridement, IV antibiotics   Device: Medtronic Dual  Therapies: on Last check: 08/05/2014 (assessed Optivol 02/05/16)  BP & Labs:  MAP 82 - Doppler is reflecting modified systolic  Hgb 13.7 - Some bleeding from VAD after cleaning/re-dressing. Specifically denies melena/BRBPR or nosebleeds.   WBC 7.3   LDH slightly higher at 230 after established baseline inpatient of 170 - 200. Denies tea-colored urine. No power elevations noted on interrogation. On lovenox for subtherapeutic INR.    Rexene Alberts, RN VAD Coordinator   Office: [phone] 24/7 Emergency VAD Pager: [phone]

## 2016-03-25 ENCOUNTER — Ambulatory Visit (HOSPITAL_COMMUNITY)
Admission: EM | Admit: 2016-03-25 | Discharge: 2016-03-26 | Disposition: A | Discharge status: Home / Self Care | Payer: Medicaid Other | Attending: Internal Medicine | Admitting: Internal Medicine

## 2016-03-25 ENCOUNTER — Encounter (HOSPITAL_COMMUNITY): Payer: Medicaid Other

## 2016-03-25 ENCOUNTER — Encounter (HOSPITAL_COMMUNITY): Payer: Self-pay | Admitting: Vascular Surgery

## 2016-03-25 ENCOUNTER — Emergency Department (HOSPITAL_COMMUNITY)
Admission: EM | Admit: 2016-03-25 | Discharge: 2016-03-25 | Disposition: A | Discharge status: Home / Self Care | Payer: Medicaid Other | Attending: Emergency Medicine | Admitting: Emergency Medicine

## 2016-03-25 DIAGNOSIS — I110 Hypertensive heart disease with heart failure: Secondary | ICD-10-CM | POA: Diagnosis not present

## 2016-03-25 DIAGNOSIS — I5022 Chronic systolic (congestive) heart failure: Secondary | ICD-10-CM

## 2016-03-25 DIAGNOSIS — T829XXA Unspecified complication of cardiac and vascular prosthetic device, implant and graft, initial encounter: Secondary | ICD-10-CM | POA: Diagnosis not present

## 2016-03-25 DIAGNOSIS — Z95811 Presence of heart assist device: Secondary | ICD-10-CM

## 2016-03-25 DIAGNOSIS — Z7901 Long term (current) use of anticoagulants: Secondary | ICD-10-CM | POA: Insufficient documentation

## 2016-03-25 DIAGNOSIS — I97190 Other postprocedural cardiac functional disturbances following cardiac surgery: Secondary | ICD-10-CM | POA: Insufficient documentation

## 2016-03-25 DIAGNOSIS — I10 Essential (primary) hypertension: Secondary | ICD-10-CM | POA: Diagnosis not present

## 2016-03-25 DIAGNOSIS — Y713 Surgical instruments, materials and cardiovascular devices (including sutures) associated with adverse incidents: Secondary | ICD-10-CM | POA: Insufficient documentation

## 2016-03-25 DIAGNOSIS — T827XXA Infection and inflammatory reaction due to other cardiac and vascular devices, implants and grafts, initial encounter: Secondary | ICD-10-CM | POA: Insufficient documentation

## 2016-03-25 DIAGNOSIS — T814XXA Infection following a procedure, initial encounter: Secondary | ICD-10-CM | POA: Diagnosis present

## 2016-03-25 DIAGNOSIS — I5023 Acute on chronic systolic (congestive) heart failure: Secondary | ICD-10-CM | POA: Diagnosis not present

## 2016-03-25 DIAGNOSIS — Z7982 Long term (current) use of aspirin: Secondary | ICD-10-CM | POA: Insufficient documentation

## 2016-03-25 DIAGNOSIS — T462X5A Adverse effect of other antidysrhythmic drugs, initial encounter: Secondary | ICD-10-CM | POA: Insufficient documentation

## 2016-03-25 DIAGNOSIS — Y849 Medical procedure, unspecified as the cause of abnormal reaction of the patient, or of later complication, without mention of misadventure at the time of the procedure: Secondary | ICD-10-CM | POA: Diagnosis not present

## 2016-03-25 DIAGNOSIS — E0580 Other thyrotoxicosis without thyrotoxic crisis or storm: Secondary | ICD-10-CM | POA: Insufficient documentation

## 2016-03-25 DIAGNOSIS — I9789 Other postprocedural complications and disorders of the circulatory system, not elsewhere classified: Secondary | ICD-10-CM

## 2016-03-25 DIAGNOSIS — Z95810 Presence of automatic (implantable) cardiac defibrillator: Secondary | ICD-10-CM | POA: Diagnosis not present

## 2016-03-25 DIAGNOSIS — Z79899 Other long term (current) drug therapy: Secondary | ICD-10-CM | POA: Insufficient documentation

## 2016-03-25 DIAGNOSIS — Y838 Other surgical procedures as the cause of abnormal reaction of the patient, or of later complication, without mention of misadventure at the time of the procedure: Secondary | ICD-10-CM | POA: Insufficient documentation

## 2016-03-25 LAB — CBC
HEMATOCRIT: 38.4 % — AB (ref 39.0–52.0)
Hemoglobin: 12.6 g/dL — ABNORMAL LOW (ref 13.0–17.0)
MCH: 28.4 pg (ref 26.0–34.0)
MCHC: 32.8 g/dL (ref 30.0–36.0)
MCV: 86.7 fL (ref 78.0–100.0)
Platelets: 173 10*3/uL (ref 150–400)
RBC: 4.43 MIL/uL (ref 4.22–5.81)
RDW: 15.1 % (ref 11.5–15.5)
WBC: 7.8 10*3/uL (ref 4.0–10.5)

## 2016-03-25 LAB — PROTIME-INR
INR: 1.8
Prothrombin Time: 21.2 seconds — ABNORMAL HIGH (ref 11.4–15.2)

## 2016-03-25 MED ORDER — TRANEXAMIC ACID 1000 MG/10ML IV SOLN
2000.0000 mg | Freq: Once | INTRAVENOUS | Status: DC
  Filled 2016-03-25: qty 20

## 2016-03-25 MED ORDER — SILVER NITRATE-POT NITRATE 75-25 % EX MISC
1.0000 | Freq: Once | CUTANEOUS | Status: AC
  Administered 2016-03-25: 1 via TOPICAL
  Filled 2016-03-25: qty 1

## 2016-03-25 NOTE — H&P (Signed)
LVAD TEAM H&P   Subjective:    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 status post dual chamber Medtronic ICD implanted April 2011. Cath 2011 showed normal coronaries. He also has a history of VTach .   Amio recently stopped due to thyrotoxicity. Placed on prednisone and tapazole. Had VT off amio and mexilitene started.  Had [place] on 8/17 as part of dual listing process at [facility]. Normal filling pressures and cardiac output.   Drive Line Infeciton 08/26/15 >>Serratia marcescens (rare) >>Cipro 500 mg bid x 7 days  09/29/15 >> Serratia marcescens (abundant) >>Cipro 500 mg BID x14d 11/20/15 >>Serratia marcescens (few) Admitted IV ceftriaxone >>cipro 500 mg twice daily x 30 days 02/03/16 >>Serratia species >>Cipro 500 mg BID x 14d; referral to ID  03/12/2016 Surgical Debridement with VAC placement. Full thickness 5x8x6 cm. Deep cultures obtained and showed serratia marcescens. Plan to continued ced on cefepime 1 gram IV every 8 hours  03/16/2016 PICC line placed for IV antibiotics.  03/17/2016 VAC reapplied with 1 gram of A Cell applied.   Admitted to [facility] on 03/09/16 for heparin bridge and surgical debridement of persistent drive line infection. VAC applied with A cell. VAC maintained seal at 75 mm hg continuous therapy. Plan for ceftriaxone for 6 wks until 04/23/16. Will also need VAC changes weekly with ACell. [facility] following for home antibiotics.   Yesterday he was seen in the VAD clinic. VAC dressing changed with ACell reapplied. INR was 1.6 and he was instructed to take lovenox and increase coumadin. Over night he developed bleeding from the LVAD wound with VAC cannister full of bloody exudate. He presented to ER and we removed the wound vac and cauterized the wound with silver nitrate.   Went to [facility] today to see the ID team as part of the process to  escalate him to Ia status on the transplant list. Developed recurrent bleeding and wound redressed after hemostasis achieved. On way home wound started bleeding again and soaked multiple bandages so he represented tot he ER. The dressing was removed and the are was prepped and draped in sterile fashion. The wound was irrigated and showed several spots of active bleeding. The wound was debrided lightly then irrigated and soaked with tranexamic acid. Hemostasis was achieved. Manual pressure was held and the wound was redressed.    LVAD INTERROGATION:  HeartMate II LVAD:  Flow 5.2 liters/min, speed 9400, power 5.8, PI 7.1 occasioanl PI events Objective:    Vital Signs:                 Temp:  [97.5 F (36.4 C)] 97.5 F (36.4 C) (12/21 0238) Pulse Rate:  [79] 79 (12/21 0238) Resp:  [16] 16 (12/21 0238) SpO2:  [100 %] 100 % (12/21 0238) Weight:  [259 lb 3.2 oz (117.6 kg)] 259 lb 3.2 oz (117.6 kg) (12/21 0234)   Mean arterial Pressure 88   Intake/Output:            No intake or output data in the 24 hours ending 03/25/16 0359         Physical Exam: General:  Well appearing. No resp difficulty HEENT: normal Neck: supple. JVP 5-6 . Carotids 2+ bilat; no bruits. No lymphadenopathy or thryomegaly appreciated. Cor: Mechanical heart sounds with LVAD hum present. Lungs: clear Abdomen: soft, nontender, nondistended. No hepatosplenomegaly. No  bruits or masses. Good bowel sounds. LVAD-  Full thickness wound with good granulation tissue but several areas of active bleeding Extremities: no cyanosis, clubbing, rash, edema Neuro: alert & orientedx3, cranial nerves grossly intact. moves all 4 extremities w/o difficulty. Affect pleasant   Labs: Basic Metabolic Panel:  Last Labs    Recent Labs Lab 03/20/16 0413 03/24/16 1351  NA 136 139  K 4.2 3.9  CL 103 107  CO2 20* 25  GLUCOSE 85 96  BUN 13 10  CREATININE 1.02 1.14  CALCIUM 9.2 9.1      Liver Function Tests:  Last Labs     Recent Labs Lab 03/24/16 1351  AST 25  ALT 20  ALKPHOS 90  BILITOT 0.8  PROT 7.1  ALBUMIN 4.1     Last Labs   No results for input(s): LIPASE, AMYLASE in the last 168 hours.   Last Labs   No results for input(s): AMMONIA in the last 168 hours.    CBC:  Last Labs    Recent Labs Lab 03/19/16 0500 03/20/16 0413 03/24/16 1348 03/25/16 0245  WBC 8.3 7.7 7.3 7.8  NEUTROABS  --   --  4.5  --   HGB 13.2 13.5 13.7 12.6*  HCT 41.1 41.4 41.8 38.4*  MCV 87.3 85.9 86.5 86.7  PLT 147* 161 177 173      INR:  Last Labs    Recent Labs Lab 03/19/16 0500 03/20/16 0413 03/23/16 03/24/16 1348 03/25/16 0245  INR 1.66 1.90 1.6 1.63 1.80      Other results:    Imaging:   Imaging Results (Last 48 hours)  No results found.     Medications:            Prior to Admission medications   Medication Sig Start Date End Date Taking? Authorizing Provider  allopurinol (ZYLOPRIM) 300 MG tablet Take 1 tablet (300 mg total) by mouth daily. 07/07/15   Jolaine Artist, MD  amLODipine (NORVASC) 2.5 MG tablet Take 1 tablet (2.5 mg total) by mouth daily. 03/21/16   Rogelia Mire, NP  aspirin EC 325 MG tablet Take 1 tablet (325 mg total) by mouth daily. 04/30/13   Jolaine Artist, MD  cefTRIAXone (ROCEPHIN) IVPB Inject 2 g into the vein every 12 (twelve) hours. Indication:  LVAD infection Last Day of Therapy:  04/23/16 Labs - Once weekly:  CBC/D and BMP, Labs - Every other week:  ESR and CRP 03/20/16 04/24/16  Rogelia Mire, NP  ENTRESTO 97-103 MG TAKE 1 TABLET BY MOUTH TWICE A DAY. 01/19/16   Jolaine Artist, MD  furosemide (LASIX) 40 MG tablet Take 1 tablet (40 mg total) by mouth daily as needed. Take as directed by clinic 02/03/16 05/03/16  Larey Dresser, MD  hydrALAZINE (APRESOLINE) 50 MG tablet Take 1 tablet (50 mg total) by mouth 2 (two) times daily. 03/20/16   Rogelia Mire, NP  LOVENOX 100 MG/ML injection Inject 1 mL (100  mg total) into the skin every 12 (twelve) hours. 03/23/16   Jolaine Artist, MD  mexiletine (MEXITIL) 150 MG capsule Take 1 capsule (150 mg total) by mouth 2 (two) times daily. 02/13/16   Jolaine Artist, MD  Multiple Vitamin (MULTIVITAMIN) tablet Take 1 tablet by mouth daily.    Historical Provider, MD  pantoprazole (PROTONIX) 40 MG tablet TAKE 1 TABLET BY MOUTH EVERY DAY 07/08/15   Jolaine Artist, MD  traMADol (ULTRAM) 50 MG tablet Take 1 tablet (50 mg  total) by mouth every 8 (eight) hours as needed for moderate pain. 12/04/15   Jolaine Artist, MD  warfarin (COUMADIN) 5 MG tablet Take 7.5 mg by mouth daily.    Historical Provider, MD      Assessment/Plan/Discussion   Mr Heintzelman is a 60 year old with HMII LVAD and drive line infection with full thickness wound. Presented to the ED with bleeding.   1. Bleeding- LVAD wound- Driveline infection. S/P Surgical Debridement 03/2016. -Recurrent bleeding at wound site. Improved from previous but still with several sites of active bleeding. Wound lightly debrided and irrigated. Treated with tranexamic acid as above. Hemostasis achieved Will follow up in [facility] tomorrow and ave Dr. Prescott Gum evaluate  Driveline infection, recurrent -S/P Surgical Debridement 03/12/2016.Deep wound culture showed Serratia again. Continue ceftriaxone 2 grams every 12 hours per ID. Plan for ceftriaxone at least 6 wks.  - He will follow up with Dr Johnnye Sima the end of January. Was seen by ID clinic at [facility] today who agreed with management and working on upgrade to Ia status  2. Chronic systolic HF, s/p HMII LVAD implant 05/2012.  - Volume status ok. NYHA I-II. - Listed 1A at [facility]. Seen at [facility] today as well - VAD parameters stable  3) HTN-  - MAP stable. Continue current dose of entresto, amlodipine, and Hydralazine.  3) Hyperthyroidism - Amio induced. Follows with Dr. Forde Dandy. Amio stopped. Recent TSH 02/2016 1.422  4) NSVT/VT - Off amio. On  mexilitene. - ICD now at EOL. 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  6) Anticoagulation with coumadin - Hold coumadin again tonight. Continue ASA 325 mg. 7) LVAD - Stable. Parameters stable.    Follow up in LVAD clinic tomorrow.    I reviewed the LVAD parameters from today, and compared the results to the patient's prior recorded data.  No programming changes were made.  The LVAD is functioning within specified parameters.  The patient performs LVAD self-test daily.  LVAD interrogation was negative for any significant power changes, alarms or PI events/speed drops.  LVAD equipment check completed and is in good working order.  Back-up equipment present.   LVAD education done on emergency procedures and precautions and reviewed exit site care.  Length of Stay: 0  Liddy Deam,MD 11:18 PM  VAD Team --- VAD ISSUES ONLY--- Pager [phone] (7am - 7am)  Advanced Heart Failure Team  Pager [phone] (M-F; 7a - 4p)  Please contact [facility] for night-coverage after hours (4p -7a ) and weekends on [email or web address]

## 2016-03-25 NOTE — Discharge Instructions (Signed)
Follow-up in VAD Clinic in the am. Call VAD pager if recurrent bleeding.

## 2016-03-25 NOTE — ED Notes (Addendum)
LVAD NP at bedside.

## 2016-03-25 NOTE — ED Triage Notes (Signed)
Pt reports to the ED for eval of leaking and bleeding from his LVAD site. Pt was seen recently for an infection in his LVAD and he had to have surgery on his site last week. today his LVAD nurse saw him and changed his dressing and afterwards it started bleeding again so she told him to come here. Pt denies any pain at this time. Also denies any fevers, chills, or N/V.

## 2016-03-25 NOTE — ED Provider Notes (Signed)
[facility] DEPT Provider Note   CSN: [id number] Arrival date & time: 03/25/16  0230  History   Chief Complaint Chief Complaint  Patient presents with  . Wound Infection    HPI Robert [name] is a 60 y.o. male.  HPI   Pt had an LVAD placed and was sent to the ER by the heart failure clinic because the site is bleeding. ON arrival Dr. Zoila Shutter and the NP are here to manage patient. He has not been feeling weak or having chest pain.  Past Medical History:  Diagnosis Date  . AICD (automatic cardioverter/defibrillator) present 2011  . Bronchitis   . Congestive heart failure ([place]) 2011  . Hypertension 2000  . LVAD (left ventricular assist device) present ([place]) 2014  . Pneumonia   . Pulmonary hypertension     Patient Active Problem List   Diagnosis Date Noted  . Left ventricular assist device (LVAD) complication, initial encounter 03/25/2016  . Serratia marcescens infection ([place]) 03/19/2016  . Wound infection   . Presence of left ventricular assist device (LVAD) ([place]) 03/09/2016  . Serratia infection 11/21/2015  . Left shoulder pain 06/22/2015  . ICD (implantable cardioverter-defibrillator) battery depletion 04/11/2015  . Disorder of lumbar spine 04/11/2015  . Annual physical exam 02/25/2015  . Left ventricular assist device (LVAD) complication 15/17/6160  . Hyperthyroidism 11/04/2014  . 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 VT (ventricular tachycardia) ([place]) 06/12/2012  . Hypokalemia 06/09/2012  . Hyposmolality and/or hyponatremia 06/09/2012  . LVAD (left ventricular assist device) present ([place]) 06/03/2012  . Cardiogenic shock ([place]) 05/31/2012  . History of ventricular tachycardia 05/14/2012  . Acute renal failure ([place]) 05/14/2012  . Acute on chronic systolic heart failure ([place]) 05/12/2012  . Syncope 05/12/2012  . Non-sustained ventricular tachycardia ([place]) 03/30/2012  . Special screening for malignant neoplasms, colon 12/29/2011  . Pulmonary hypertension, mild to  moderate by 2D 5/13 08/17/2011  . AICD (automatic cardioverter/defibrillator) present, Medtronic placed 2011   . Essential hypertension, benign 08/07/2009  . NICM (EF <15% on TTE 01/2012), normal cors at cath 4/11 08/07/2009  . VENTRICULAR TACHYCARDIA, ICD dischanrge 08/04/11, beta blocker added 08/07/2009  . Chronic systolic heart failure ([place]) 08/07/2009  . HEMORRHOIDS 08/07/2009    Past Surgical History:  Procedure Laterality Date  . APPLICATION OF WOUND VAC N/A 03/12/2016   Procedure: APPLICATION OF WOUND VAC;  Surgeon: Ivin Poot, MD;  Location: [place];  Service: Thoracic;  Laterality: N/A;  . CARDIAC CATHETERIZATION    . CARDIAC CATHETERIZATION N/A 11/04/2014   Procedure: Right Heart Cath;  Surgeon: Jolaine Artist, MD;  Location: [place] CV LAB;  Service: Cardiovascular;  Laterality: N/A;  . CARDIAC CATHETERIZATION N/A 11/06/2015   Procedure: Right Heart Cath;  Surgeon: Jolaine Artist, MD;  Location: [place] CV LAB;  Service: Cardiovascular;  Laterality: N/A;  . CARDIAC DEFIBRILLATOR PLACEMENT  06/03/11  . COLONOSCOPY  12/29/2011   Procedure: COLONOSCOPY;  Surgeon: Gatha Mayer, MD;  Location: [facility];  Service: Endoscopy;  Laterality: N/A;  . INSERTION OF IMPLANTABLE LEFT VENTRICULAR ASSIST DEVICE N/A 06/02/2012   Procedure: INSERTION OF IMPLANTABLE LEFT VENTRICULAR ASSIST DEVICE;  Surgeon: Ivin Poot, MD;  Location: [place] [facility];  Service: Open Heart Surgery;  Laterality: N/A;  Nitric Oxide; TEE; Medtronic AICD  . INTRA-AORTIC BALLOON PUMP INSERTION N/A 05/31/2012   Procedure: INTRA-AORTIC BALLOON PUMP INSERTION;  Surgeon: Jolaine Artist, MD;  Location: [facility] LAB;  Service: Cardiovascular;  Laterality: N/A;  . INTRAOPERATIVE TRANSESOPHAGEAL ECHOCARDIOGRAM N/A 06/02/2012  Procedure: INTRAOPERATIVE TRANSESOPHAGEAL ECHOCARDIOGRAM;  Surgeon: Ivin Poot, MD;  Location: [facility];  Service: Open Heart Surgery;  Laterality: N/A;  . LEFT AND RIGHT HEART CATHETERIZATION  WITH CORONARY ANGIOGRAM N/A 08/17/2011   Procedure: LEFT AND RIGHT HEART CATHETERIZATION WITH CORONARY ANGIOGRAM;  Surgeon: Sanda Klein, MD;  Location: [place] CATH LAB;  Service: Cardiovascular;  Laterality: N/A;  . RIGHT HEART CATHETERIZATION N/A 05/30/2012   Procedure: RIGHT HEART CATH;  Surgeon: Jolaine Artist, MD;  Location: [facility] CATH LAB;  Service: Cardiovascular;  Laterality: N/A;  . RIGHT HEART CATHETERIZATION N/A 06/10/2014   Procedure: RIGHT HEART CATH;  Surgeon: Jolaine Artist, MD;  Location: [facility] CATH LAB;  Service: Cardiovascular;  Laterality: N/A;  . STERNAL WOUND DEBRIDEMENT N/A 03/12/2016   Procedure: WOUND DEBRIDEMENT AT [place];  Surgeon: Ivin Poot, MD;  Location: [facility] OR;  Service: Thoracic;  Laterality: N/A;       Home Medications    Prior to Admission medications   Medication Sig Start Date End Date Taking? Authorizing Provider  allopurinol (ZYLOPRIM) 300 MG tablet Take 1 tablet (300 mg total) by mouth daily. 07/07/15   Jolaine Artist, MD  amLODipine (NORVASC) 2.5 MG tablet Take 1 tablet (2.5 mg total) by mouth daily. 03/21/16   Rogelia Mire, NP  aspirin EC 325 MG tablet Take 1 tablet (325 mg total) by mouth daily. 04/30/13   Jolaine Artist, MD  cefTRIAXone (ROCEPHIN) IVPB Inject 2 g into the vein every 12 (twelve) hours. Indication:  LVAD infection Last Day of Therapy:  04/23/16 Labs - Once weekly:  CBC/D and BMP, Labs - Every other week:  ESR and CRP 03/20/16 04/24/16  Rogelia Mire, NP  furosemide (LASIX) 40 MG tablet Take 1 tablet (40 mg total) by mouth daily as needed. Take as directed by clinic 02/03/16 05/03/16  Larey Dresser, MD  hydrALAZINE (APRESOLINE) 50 MG tablet Take 1 tablet (50 mg total) by mouth 2 (two) times daily. 03/20/16   Rogelia Mire, NP  LOVENOX 100 MG/ML injection Inject 1 mL (100 mg total) into the skin every 12 (twelve) hours. 03/23/16   Jolaine Artist, MD  mexiletine (MEXITIL) 150 MG capsule Take 1 capsule (150  mg total) by mouth 2 (two) times daily. 02/13/16   Jolaine Artist, MD  Multiple Vitamin (MULTIVITAMIN) tablet Take 1 tablet by mouth daily.    Historical Provider, MD  pantoprazole (PROTONIX) 40 MG tablet Take 1 tablet (40 mg total) by mouth daily. 03/26/16   Shaune Pascal Bensimhon, MD  sacubitril-valsartan (ENTRESTO) 97-103 MG Take 1 tablet by mouth 2 (two) times daily. 03/26/16   Jolaine Artist, MD  traMADol (ULTRAM) 50 MG tablet Take 1 tablet (50 mg total) by mouth every 8 (eight) hours as needed for moderate pain. 12/04/15   Jolaine Artist, MD  warfarin (COUMADIN) 5 MG tablet Take 7.5 mg by mouth daily.    Historical Provider, MD    Family History No family history on file.  Social History Social History  Substance Use Topics  . Smoking status: Never Smoker  . Smokeless tobacco: Never Used  . Alcohol use No     Allergies   Metoprolol and Imdur [isosorbide nitrate]   Review of Systems Review of Systems Review of Systems All other systems negative except as documented in the HPI. All pertinent positives and negatives as reviewed in the HPI.   Physical Exam Updated Vital Signs Pulse 79   Temp 97.5 F (36.4  C) (Oral)   Resp 16   Ht 6' 2"  (1.88 m)   Wt 117.6 kg   SpO2 100%   BMI 33.28 kg/m   Physical Exam  Constitutional: He appears well-developed and well-nourished. No distress.  HENT:  Head: Normocephalic and atraumatic.  Eyes: Pupils are equal, round, and reactive to light.  Neck: Normal range of motion. Neck supple.  Cardiovascular: Normal rate and regular rhythm.   Pulmonary/Chest: Effort normal.  Abdominal: Soft.  LVAD exam done by Dr. Jacinto Reap and his NP, please defer to there note for details.  Neurological: He is alert.  Skin: Skin is warm and dry.  Nursing note and vitals reviewed.    ED Treatments / Results  Labs (all labs ordered are listed, but only abnormal results are displayed) Labs Reviewed  PROTIME-INR - Abnormal; Notable for the  following:       Result Value   Prothrombin Time 21.2 (*)    All other components within normal limits  CBC - Abnormal; Notable for the following:    Hemoglobin 12.6 (*)    HCT 38.4 (*)    All other components within normal limits    EKG  EKG Interpretation None       Radiology No results found.  Procedures Procedures (including critical care time)  Medications Ordered in ED Medications  silver nitrate applicators applicator 1 Stick (1 Stick Topical Given by Other 03/25/16 0357)     Initial Impression / Assessment and Plan / ED Course  I have reviewed the triage vital signs and the nursing notes.  Pertinent labs & imaging results that were available during my care of the patient were reviewed by me and considered in my medical decision making (see chart for details).  Clinical Course     Case managed by Dr. Zoila Shutter. They checked a CBC and PT/INR, both of which are stable, he is to hold his coumadin for 1 day. F/u tomorrow with Heart failure Clinic.  Final Clinical Impressions(s) / ED Diagnoses   Final diagnoses:  Postoperative surgical complication involving circulatory system associated with non-circulatory procedure, unspecified complication    New Prescriptions Discharge Medication List as of 03/25/2016  3:58 AM       Delos Haring, PA-C 03/28/16 2015    [place], DO 04/02/16 0207

## 2016-03-25 NOTE — Consult Note (Addendum)
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 status post dual chamber Medtronic ICD implanted April 2011. Cath 2011 showed normal coronaries. He also has a history of VTach .   Amio recently stopped due to thyrotoxicity. Placed on prednisone and tapazole. Had VT off amio and mexilitene started.  Had [place] on 8/17 as part of dual listing process at [facility]. Normal filling pressures and cardiac output.   Drive Line Infeciton 08/26/15 >>Serratia marcescens (rare) >>Cipro 500 mg bid x 7 days  09/29/15 >> Serratia marcescens (abundant) >>Cipro 500 mg BID x14d 11/20/15 >>Serratia marcescens (few) Admitted IV ceftriaxone >>cipro 500 mg twice daily x 30 days 02/03/16 >>Serratia species >>Cipro 500 mg BID x 14d; referral to ID  03/12/2016 Surgical Debridement with VAC placement. Full thickness 5x8x6 cm. Deep cultures obtained and showed serratia marcescens. Plan to continued ced on cefepime 1 gram IV every 8 hours  03/16/2016 PICC line placed for IV antibiotics.  03/17/2016 VAC reapplied with 1 gram of A Cell applied.   Admitted to [facility] on 12/5 for heparin bridge and surgical debridement of persistent drive line infection. VAC applied with A cell. VAC maintained seal at 75 mm hg continuous therapy. Plan for ceftriaxone for 6 wks until 04/23/16. Will also need VAC changes weekly with ACell. [facility] following for home antibiotics.   Yesterday he was seen in the VAD clinic. VAC dressing changed with ACell reapplied. INR was 1.6 and he was instructed to take lovenox and increase coumadin. Over night he developed bleeding from the LVAD wound with VAC cannister full of bloody exudate. Denies dizziness. Currently listed 1A at [facility] and has follow up with [facility] today. No fever of chills.  He  has follow up with ID, Dr Johnnye Sima January 30th.  Followed by [facility] for home antibiotics.     LVAD INTERROGATION:  HeartMate II LVAD:  Flow 4.6 liters/min, speed 9400, power 5.5, PI 6.4   Review of Systems: [y] = yes, [ ]  = no    General: Weight gain [] ; Weight loss [ ] ; Anorexia [ ] ; Fatigue [y]; Fever [ ] ; Chills [ ] ; Weakness Robert [name] ]   Cardiac: Chest pain/pressure [ ] ; Resting SOB [ y]; Exertional SOB [y]; Orthopnea [ ] ; Pedal Edema [] ; Palpitations [ ] ; Syncope [ ] ; Presyncope [ ] ; Paroxysmal nocturnal dyspnea[ ]    Pulmonary: Cough [ ] ; Wheezing[ ] ; Hemoptysis[ ] ; Sputum [ ] ; Snoring [ ]    GI: Vomiting[ ] ; Dysphagia[ ] ; Melena[ ] ; Hematochezia [ ] ; Heartburn[ ] ; Abdominal pain [ ] ; Constipation [ ] ; Diarrhea [ ] ; BRBPR [ ]    GU: Hematuria[ ] ; Dysuria [ ] ; Nocturia[ ]   Vascular: Pain in legs with walking [ ] ; Pain in feet with lying flat [ ] ; Non-healing sores [ ] ; Stroke [ ] ; TIA [ ] ; Slurred speech [ ] ;   Neuro: Headaches[ ] ; Vertigo[ ] ; Seizures[ ] ; Paresthesias[ ] ;Blurred vision [ ] ; Diplopia [ ] ; Vision changes [ ]    Ortho/Skin: Arthritis [y]; Joint pain [y]; Muscle pain [ ] ; Joint swelling [ ] ; Back Pain [ ] ; Rash [ ]    Psych: Depression[ ] ; Anxiety[ ]    Heme: Bleeding problems Robert [name] ]; Clotting disorders [ ] ; Anemia [ ]    Endocrine: Diabetes [ ] ; Thyroid dysfunction[ ]   Objective:    Vital Signs:   Temp:  [97.5 F (36.4 C)] 97.5 F (36.4 C) (12/21 0238) Pulse Rate:  [79] 79 (12/21 0238) Resp:  [16] 16 (12/21 0238) SpO2:  [100 %] 100 % (12/21 0238) Weight:  [259 lb 3.2 oz (117.6 kg)] 259 lb 3.2 oz (117.6 kg) (12/21 0234)   Mean arterial Pressure 90   Intake/Output:  No intake or output data in the 24 hours ending 03/25/16 0359   Physical Exam: General:  Well appearing. No resp difficulty HEENT: normal Neck: supple. JVP 5-6 . Carotids 2+ bilat; no bruits. No lymphadenopathy or thryomegaly appreciated. Cor: Mechanical heart sounds with LVAD hum  present. Lungs: clear Abdomen: soft, nontender, nondistended. No hepatosplenomegaly. No bruits or masses. Good bowel sounds. LVAD- bloody exudate around vac. VAC removed. Deep wound with diffuse bleeding at [facility] site Extremities: no cyanosis, clubbing, rash, edema Neuro: alert & orientedx3, cranial nerves grossly intact. moves all 4 extremities w/o difficulty. Affect pleasant   Labs: Basic Metabolic Panel:  Recent Labs Lab 03/20/16 0413 03/24/16 1351  NA 136 139  K 4.2 3.9  CL 103 107  CO2 20* 25  GLUCOSE 85 96  BUN 13 10  CREATININE 1.02 1.14  CALCIUM 9.2 9.1    Liver Function Tests:  Recent Labs Lab 03/24/16 1351  AST 25  ALT 20  ALKPHOS 90  BILITOT 0.8  PROT 7.1  ALBUMIN 4.1   No results for input(s): LIPASE, AMYLASE in the last 168 hours. No results for input(s): AMMONIA in the last 168 hours.  CBC:  Recent Labs Lab 03/19/16 0500 03/20/16 0413 03/24/16 1348 03/25/16 0245  WBC 8.3 7.7 7.3 7.8  NEUTROABS  --   --  4.5  --   HGB 13.2 13.5 13.7 12.6*  HCT 41.1 41.4 41.8 38.4*  MCV 87.3 85.9 86.5 86.7  PLT 147* 161 177 173    INR:  Recent Labs Lab 03/19/16 0500 03/20/16 0413 03/23/16 03/24/16 1348 03/25/16 0245  INR 1.66 1.90 1.6 1.63 1.80    Other results:    Imaging:  No results found.   Medications:     Prior to Admission medications   Medication Sig Start Date End Date Taking? Authorizing Provider  allopurinol (ZYLOPRIM) 300 MG tablet Take 1 tablet (300 mg total) by mouth daily. 07/07/15   Jolaine Artist, MD  amLODipine (NORVASC) 2.5 MG tablet Take 1 tablet (2.5 mg total) by mouth daily. 03/21/16   Rogelia Mire, NP  aspirin EC 325 MG tablet Take 1 tablet (325 mg total) by mouth daily. 04/30/13   Jolaine Artist, MD  cefTRIAXone (ROCEPHIN) IVPB Inject 2 g into the vein every 12 (twelve) hours. Indication:  LVAD infection Last Day of Therapy:  04/23/16 Labs - Once weekly:  CBC/D and BMP, Labs - Every other week:  ESR and  CRP 03/20/16 04/24/16  Rogelia Mire, NP  ENTRESTO 97-103 MG TAKE 1 TABLET BY MOUTH TWICE A DAY. 01/19/16   Jolaine Artist, MD  furosemide (LASIX) 40 MG tablet Take 1 tablet (40 mg total) by mouth daily as needed. Take as directed by clinic 02/03/16 05/03/16  Larey Dresser, MD  hydrALAZINE (APRESOLINE) 50 MG tablet Take 1 tablet (50 mg total) by mouth 2 (two) times daily. 03/20/16   Rogelia Mire, NP  LOVENOX 100 MG/ML injection Inject 1 mL (100 mg total) into the skin every 12 (twelve) hours. 03/23/16   Jolaine Artist, MD  mexiletine (MEXITIL) 150 MG  capsule Take 1 capsule (150 mg total) by mouth 2 (two) times daily. 02/13/16   Jolaine Artist, MD  Multiple Vitamin (MULTIVITAMIN) tablet Take 1 tablet by mouth daily.    Historical Provider, MD  pantoprazole (PROTONIX) 40 MG tablet TAKE 1 TABLET BY MOUTH EVERY DAY 07/08/15   Jolaine Artist, MD  traMADol (ULTRAM) 50 MG tablet Take 1 tablet (50 mg total) by mouth every 8 (eight) hours as needed for moderate pain. 12/04/15   Jolaine Artist, MD  warfarin (COUMADIN) 5 MG tablet Take 7.5 mg by mouth daily.    Historical Provider, MD   Social History   Social History  . Marital status: Single    Spouse name: N/A  . Number of children: 2  . Years of education: N/A   Occupational History  . Disabled    Social History Main Topics  . Smoking status: Never Smoker  . Smokeless tobacco: Never Used  . Alcohol use No  . Drug use: No  . Sexual activity: Not Currently   Other Topics Concern  . None   Social History Narrative  . 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 Presented to the ED with bleeding.   1. Bleeding- LVAD wound- Driveline infection. S/P Surgical Debridement 03/2016. Removed VAC and applied silver nitrate to oozing areas. Leave VAC off.  Applied W-D gauze. INR 1.8. Hgb 12.6. Instructed to hold coumadin and lovenox today. Return to LVAD clinic tomorrow.   Driveline infection, recurrent -S/P Surgical Debridement 03/12/2016.Deep wound culture showed Serratia again. Continue ceftriaxone 2 grams every 12 hours per ID. Plan for ceftriaxone at least 6 wks.  - He will follow up with Dr Johnnye Sima the end of January.  2. Chronic systolic HF, s/p HMII LVAD implant 05/2012.  - Volume status ok. NYHA I-II. - Listed 1A at [facility]. Has follow up at [facility] today.   - Intolerant of BB.  3) HTN-  - MAP stable. Continue current dose of entresto, amlodipine, and  Hydralazine.   3) Hyperthyroidism - Amio induced. Follows with Dr. Forde Dandy. Amio stopped. Recent TSH 02/2016 1.422   4) NSVT/VT  - Off amio. On mexilitene. - ICD now at EOL. 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  6) Anticoagulation with coumadin  - Hold coumadin tonight. Continue ASA 325 mg for LVAD. 7) LVAD  - Stable. Parameters stable.    Follow up in LVAD clinic tomorrow.    I reviewed the LVAD parameters from today, and compared the results to the patient's prior recorded data.  No programming changes were made.  The LVAD is functioning within specified parameters.  The patient performs LVAD self-test daily.  LVAD interrogation was negative for any significant power changes, alarms or PI events/speed drops.  LVAD equipment check completed and is in good working order.  Back-up equipment present.   LVAD education done on emergency procedures and precautions and reviewed exit site care.  Length of Stay: 0  Amy Clegg NP-C  03/25/2016, 3:59 AM  VAD Team --- VAD ISSUES ONLY--- Pager [phone] (7am - 7am)  Advanced Heart Failure Team  Pager [phone] (M-F; 7a - 4p)  Please contact [facility] for  night-coverage after hours (4p -7a ) and weekends on [email or web address]  Patient seen and examined with Robert Grinder, NP. We discussed all aspects of the encounter. I agree with the assessment and plan as stated above.   He presents with recurrent severe bleeding at driveline site. Wound vac removed. And pressure held and bleeder cauterized with silver nitrate and then repacked by Robert Grinder NP-C with me at bedside. Volume status and VAD parameters stable. Will continue IV antibiotics. Has appointment later to day at [facility] to discuss switching to I-A transplant status. Hold coumadin. INR 1.8. Hgb down 1 gram 13.7 -> 12.6.  Robert [place],MD 4:18 AM

## 2016-03-26 ENCOUNTER — Ambulatory Visit (HOSPITAL_COMMUNITY): Payer: Self-pay | Admitting: Pharmacist

## 2016-03-26 ENCOUNTER — Ambulatory Visit (HOSPITAL_COMMUNITY)
Admission: RE | Admit: 2016-03-26 | Discharge: 2016-03-26 | Disposition: A | Discharge status: Home / Self Care | Payer: Medicaid Other | Source: Ambulatory Visit | Attending: Internal Medicine | Admitting: Internal Medicine

## 2016-03-26 VITALS — BP 106/66 | HR 78

## 2016-03-26 DIAGNOSIS — I5022 Chronic systolic (congestive) heart failure: Secondary | ICD-10-CM

## 2016-03-26 DIAGNOSIS — Z95811 Presence of heart assist device: Secondary | ICD-10-CM

## 2016-03-26 DIAGNOSIS — T829XXA Unspecified complication of cardiac and vascular prosthetic device, implant and graft, initial encounter: Secondary | ICD-10-CM

## 2016-03-26 DIAGNOSIS — T827XXA Infection and inflammatory reaction due to other cardiac and vascular devices, implants and grafts, initial encounter: Secondary | ICD-10-CM | POA: Diagnosis present

## 2016-03-26 DIAGNOSIS — I5023 Acute on chronic systolic (congestive) heart failure: Secondary | ICD-10-CM | POA: Diagnosis not present

## 2016-03-26 DIAGNOSIS — I10 Essential (primary) hypertension: Secondary | ICD-10-CM

## 2016-03-26 DIAGNOSIS — E0580 Other thyrotoxicosis without thyrotoxic crisis or storm: Secondary | ICD-10-CM | POA: Diagnosis not present

## 2016-03-26 DIAGNOSIS — Y839 Surgical procedure, unspecified as the cause of abnormal reaction of the patient, or of later complication, without mention of misadventure at the time of the procedure: Secondary | ICD-10-CM | POA: Insufficient documentation

## 2016-03-26 DIAGNOSIS — Z7901 Long term (current) use of anticoagulants: Secondary | ICD-10-CM | POA: Diagnosis not present

## 2016-03-26 DIAGNOSIS — Y838 Other surgical procedures as the cause of abnormal reaction of the patient, or of later complication, without mention of misadventure at the time of the procedure: Secondary | ICD-10-CM | POA: Diagnosis not present

## 2016-03-26 DIAGNOSIS — T462X5A Adverse effect of other antidysrhythmic drugs, initial encounter: Secondary | ICD-10-CM | POA: Diagnosis not present

## 2016-03-26 DIAGNOSIS — I110 Hypertensive heart disease with heart failure: Secondary | ICD-10-CM | POA: Diagnosis not present

## 2016-03-26 DIAGNOSIS — Z7982 Long term (current) use of aspirin: Secondary | ICD-10-CM | POA: Diagnosis not present

## 2016-03-26 DIAGNOSIS — Z79899 Other long term (current) drug therapy: Secondary | ICD-10-CM | POA: Diagnosis not present

## 2016-03-26 LAB — CBC
HCT: 37 % — ABNORMAL LOW (ref 39.0–52.0)
Hemoglobin: 12.3 g/dL — ABNORMAL LOW (ref 13.0–17.0)
MCH: 28.7 pg (ref 26.0–34.0)
MCHC: 33.2 g/dL (ref 30.0–36.0)
MCV: 86.4 fL (ref 78.0–100.0)
PLATELETS: 166 10*3/uL (ref 150–400)
RBC: 4.28 MIL/uL (ref 4.22–5.81)
RDW: 15.2 % (ref 11.5–15.5)
WBC: 7.2 10*3/uL (ref 4.0–10.5)

## 2016-03-26 LAB — PROTIME-INR
INR: 1.83
PROTHROMBIN TIME: 21.4 s — AB (ref 11.4–15.2)

## 2016-03-26 LAB — LACTATE DEHYDROGENASE: LDH: 205 U/L — AB (ref 98–192)

## 2016-03-26 MED ORDER — PANTOPRAZOLE SODIUM 40 MG PO TBEC: 40.0000 mg | DELAYED_RELEASE_TABLET | Freq: Every day | ORAL | 3 refills | Status: DC

## 2016-03-26 MED ORDER — SACUBITRIL-VALSARTAN 97-103 MG PO TABS: 1.0000 | ORAL_TABLET | Freq: Two times a day (BID) | ORAL | 3 refills | Status: DC

## 2016-03-26 MED FILL — ENTRESTO 97 MG-103 MG TAB: 97-103 | 30 days supply | Qty: 60 | Fill #0

## 2016-03-26 MED FILL — PANTOPRAZOLE SOD DR 40 MG T: 40 | 30 days supply | Qty: 30 | Fill #0

## 2016-03-26 NOTE — Progress Notes (Signed)
LVAD CLINIC NOTE  Patient ID: Robert [name], male   DOB: December 16, 1955, 60 y.o.   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 status post dual chamber Medtronic ICD implanted April 2011. Cath 2011 showed normal coronaries. He also has a history of VTach .   Amio recently stopped due to thyrotoxicity. Placed on prednisone and tapazole. Had VT off amio and mexilitene started.  Had [place] on 8/17 as part of dual listing process at [facility]. Normal filling pressures and cardiac output.   Drive Line Infeciton  08/26/15 >>Serratia marcescens (rare) >>Cipro 500 mg bid x 7 days  09/29/15 >> Serratia marcescens (abundant) >>Cipro 500 mg BID x14d 11/20/15 >>Serratia marcescens (few) Admitted IV ceftriaxone >>cipro 500 mg twice daily x 30 days 02/03/16 >> Serratia species >> Cipro 500 mg BID x 14d; referral to ID  03/12/2016 Surgical Debridement with VAC placement. Full thickness 5x8x6 cm. 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 Plan to continued ced on cefepime 1 gram IV every 8 hours  03/16/2016 PICC line placed for IV antibiotics.  03/17/2016 VAC reapplied with 1 gram of A Cell applied. 03/25/2016 VAC removed Damp saline dressing applied.    Admitted to [facility] on 12/5 for heparin bridge and surgical debridement of persistent drive line infection. VAC applied with A cell. VAC maintained seal at 75 mm hg continuous therapy. Plan for ceftriaxone for 6 wks until 04/23/16. Will also need VAC changes weekly with ACell. [facility] following for home antibiotics.   He returns for LVAD follow up for bleeding. Over the last 24 hours 2 episodes of bleeding. VAC removed 03/25/16 due to bleeding. Wound repacked but had recurrent bleeding with clots. Overall feeling ok. Denies SOB/PND/Orthopnea. Weight at home 242  pounds. No BRBPR. No fever or chills.  He has follow up with ID, Robert [name] January 30th.  Followed by [facility] for home antibiotics.   Recent RHC on 11/06/15 as part of Transplant process RA = 6 RV =  18/3/6 PA = 19/7 (12) PCW = 8 Fick cardiac output/index = 6.1/2.6 PVR = .0.7 WU Ao sat = 98% PA sat = 70%, 69%:   LVAD interrogation reveals:  Speed:  9400 Flow: 4.8 Power: 5 6 PI: 6.4  Alarms:  none Events: 5 - 10 PI events daily  Fixed speed:  9400 Low speed limit:  8800  11V battery status: Primary controller: replace 17 months                                   Secondary controller: replace 15 months    I reviewed the LVAD parameters from today, and compared the results to the patient's prior recorded data.  No programming changes were made.  The LVAD is functioning within specified parameters.  The patient performs LVAD self-test daily.  LVAD interrogation was negative for any significant power changes, alarms or PI events/speed drops.  LVAD equipment check completed and is in good working order.  Back-up equipment present.   LVAD education done on emergency procedures and precautions and reviewed exit site care.    Past Medical History:  Diagnosis Date  . AICD (automatic cardioverter/defibrillator) present 2011  . Bronchitis   . Congestive heart failure ([place])  2011  . Hypertension 2000  . LVAD (left ventricular assist device) present ([place]) 2014  . Pneumonia   . Pulmonary hypertension     Current Outpatient Prescriptions  Medication Sig Dispense Refill  . allopurinol (ZYLOPRIM) 300 MG tablet Take 1 tablet (300 mg total) by mouth daily. 30 tablet 6  . amLODipine (NORVASC) 2.5 MG tablet Take 1 tablet (2.5 mg total) by mouth daily. 30 tablet 6  . aspirin EC 325 MG tablet Take 1 tablet (325 mg total) by mouth daily. 30 tablet 6  . cefTRIAXone (ROCEPHIN) IVPB Inject 2 g into the vein every 12 (twelve) hours. Indication:  LVAD infection Last Day of Therapy:  04/23/16 Labs - Once  weekly:  CBC/D and BMP, Labs - Every other week:  ESR and CRP 70 Units 0  . ENTRESTO 97-103 MG TAKE 1 TABLET BY MOUTH TWICE A DAY. 60 tablet 3  . furosemide (LASIX) 40 MG tablet Take 1 tablet (40 mg total) by mouth daily as needed. Take as directed by clinic 30 tablet 3  . hydrALAZINE (APRESOLINE) 50 MG tablet Take 1 tablet (50 mg total) by mouth 2 (two) times daily. 60 tablet 6  . LOVENOX 100 MG/ML injection Inject 1 mL (100 mg total) into the skin every 12 (twelve) hours. 10 Syringe 1  . mexiletine (MEXITIL) 150 MG capsule Take 1 capsule (150 mg total) by mouth 2 (two) times daily. 60 capsule 5  . Multiple Vitamin (MULTIVITAMIN) tablet Take 1 tablet by mouth daily.    . pantoprazole (PROTONIX) 40 MG tablet TAKE 1 TABLET BY MOUTH EVERY DAY 90 tablet 3  . traMADol (ULTRAM) 50 MG tablet Take 1 tablet (50 mg total) by mouth every 8 (eight) hours as needed for moderate pain. 30 tablet 5  . warfarin (COUMADIN) 5 MG tablet Take 7.5 mg by mouth daily.     No current facility-administered medications for this encounter.     Metoprolol and Imdur [isosorbide nitrate]  REVIEW OF SYSTEMS: All systems negative except as listed in HPI, PMH and Problem list.   Vital signs: HR: 78 MAP BP: 87 modified systolic Auto cuff:  287 /68 O2 Sat: 100% Wt: Reported 242 pounds.  Ht:  6'2"  Physical Exam: GENERAL: Well appearing, male who presents to clinic today in no acute distress. HEENT: normal  NECK: Supple, JVP 7;  2+ bilaterally, no bruits.  No lymphadenopathy or thyromegaly appreciated.  CARDIAC:  Mechanical heart sounds with LVAD hum present.  LUNGS:  Clear to auscultation bilaterally.  ABDOMEN:  Soft, round, nontender, positive bowel sounds x4.     LVAD exit site: VAC dressing removed . Full thickness wound--> 8.5x1.5x5 cm after wound cauterized at bed side. Driveline itself with multiple twists EXTREMITIES:  Warm and dry, no cyanosis, clubbing, rash. RUE PICC double lumen.   NEUROLOGIC:  Alert  and oriented x 4.  Gait steady.  No aphasia.  No dysarthria.  Affect pleasant.     Recent Results (from the past 2160 hour(s))  Basic metabolic panel     Status: Abnormal   Collection Time: 01/08/16  9:30 AM  Result Value Ref Range   Sodium 138 135 - 145 mmol/L   Potassium 3.4 (L) 3.5 - 5.1 mmol/L   Chloride 105 101 - 111 mmol/L   CO2 26 22 - 32 mmol/L   Glucose, Bld 90 65 - 99 mg/dL   BUN 7 6 - 20 mg/dL   Creatinine, Ser 1.09 0.61 - 1.24 mg/dL   Calcium 9.0  8.9 - 10.3 mg/dL   GFR calc non Af Amer >60 >60 mL/min   GFR calc Af Amer >60 >60 mL/min    Comment: (NOTE) The eGFR has been calculated using the CKD EPI equation. This calculation has not been validated in all clinical situations. eGFR's persistently <60 mL/min signify possible Chronic Kidney Disease.    Anion gap 7 5 - 15  CBC     Status: None   Collection Time: 01/08/16  9:30 AM  Result Value Ref Range   WBC 6.7 4.0 - 10.5 K/uL   RBC 5.11 4.22 - 5.81 MIL/uL   Hemoglobin 14.6 13.0 - 17.0 g/dL   HCT 44.3 39.0 - 52.0 %   MCV 86.7 78.0 - 100.0 fL   MCH 28.6 26.0 - 34.0 pg   MCHC 33.0 30.0 - 36.0 g/dL   RDW 14.6 11.5 - 15.5 %   Platelets 174 150 - 400 K/uL  Protime-INR     Status: Abnormal   Collection Time: 01/08/16  9:30 AM  Result Value Ref Range   Prothrombin Time 27.3 (H) 11.4 - 15.2 seconds   INR 2.48   Lactate dehydrogenase     Status: Abnormal   Collection Time: 01/08/16  9:30 AM  Result Value Ref Range   LDH 278 (H) 98 - 192 U/L  Protime-INR     Status: Abnormal   Collection Time: 01/28/16 10:18 AM  Result Value Ref Range   Prothrombin Time 27.1 (H) 11.4 - 15.2 seconds   INR 9.23   Basic metabolic panel     Status: None   Collection Time: 02/03/16 12:43 PM  Result Value Ref Range   Sodium 139 135 - 145 mmol/L   Potassium 4.0 3.5 - 5.1 mmol/L   Chloride 106 101 - 111 mmol/L   CO2 26 22 - 32 mmol/L   Glucose, Bld 90 65 - 99 mg/dL   BUN 12 6 - 20 mg/dL   Creatinine, Ser 1.10 0.61 - 1.24 mg/dL    Calcium 9.2 8.9 - 10.3 mg/dL   GFR calc non Af Amer >60 >60 mL/min   GFR calc Af Amer >60 >60 mL/min    Comment: (NOTE) The eGFR has been calculated using the CKD EPI equation. This calculation has not been validated in all clinical situations. eGFR's persistently <60 mL/min signify possible Chronic Kidney Disease.    Anion gap 7 5 - 15  CBC     Status: None   Collection Time: 02/03/16 12:43 PM  Result Value Ref Range   WBC 7.0 4.0 - 10.5 K/uL   RBC 5.03 4.22 - 5.81 MIL/uL   Hemoglobin 14.3 13.0 - 17.0 g/dL   HCT 43.5 39.0 - 52.0 %   MCV 86.5 78.0 - 100.0 fL   MCH 28.4 26.0 - 34.0 pg   MCHC 32.9 30.0 - 36.0 g/dL   RDW 15.2 11.5 - 15.5 %   Platelets 172 150 - 400 K/uL  B Nat Peptide     Status: None   Collection Time: 02/03/16 12:43 PM  Result Value Ref Range   B Natriuretic Peptide 66.1 0.0 - 100.0 pg/mL  INR/PT     Status: Abnormal   Collection Time: 02/03/16 12:43 PM  Result Value Ref Range   Prothrombin Time 24.5 (H) 11.4 - 15.2 seconds   INR 2.17   Aerobic Culture (superficial specimen)     Status: None   Collection Time: 02/03/16  3:32 PM  Result Value Ref Range   Specimen Description WOUND  ABDOMEN    Special Requests DRIVE LINE SITE    Gram Stain      FEW WBC PRESENT, PREDOMINANTLY PMN NO ORGANISMS SEEN    Culture RARE SERRATIA SPECIES    Report Status 02/06/2016 FINAL    Organism ID, Bacteria SERRATIA SPECIES       Susceptibility   Serratia species - MIC*    CEFAZOLIN >=64 RESISTANT Resistant     CEFEPIME <=1 SENSITIVE Sensitive     CEFTAZIDIME <=1 SENSITIVE Sensitive     CEFTRIAXONE <=1 SENSITIVE Sensitive     CIPROFLOXACIN 1 SENSITIVE Sensitive     GENTAMICIN <=1 SENSITIVE Sensitive     TRIMETH/SULFA 40 SENSITIVE Sensitive     * RARE SERRATIA SPECIES  TSH     Status: None   Collection Time: 02/05/16  3:36 PM  Result Value Ref Range   TSH 1.422 0.350 - 4.500 uIU/mL    Comment: Performed by a 3rd Generation assay with a functional sensitivity of <=0.01  uIU/mL.  T4, free     Status: None   Collection Time: 02/05/16  3:36 PM  Result Value Ref Range   Free T4 0.95 0.61 - 1.12 ng/dL    Comment: (NOTE) Biotin ingestion may interfere with free T4 tests. If the results are inconsistent with the TSH level, previous test results, or the clinical presentation, then consider biotin interference. If needed, order repeat testing after stopping biotin.   T3, free     Status: None   Collection Time: 02/05/16  3:36 PM  Result Value Ref Range   T3, Free 2.6 2.0 - 4.4 pg/mL    Comment: (NOTE) Performed At: [facility] [place], [place] [id number] Lindon Romp MD PO:[id number]   Lactate dehydrogenase     Status: Abnormal   Collection Time: 02/13/16 10:49 AM  Result Value Ref Range   LDH 209 (H) 98 - 192 U/L  Protime-INR     Status: Abnormal   Collection Time: 02/13/16 10:49 AM  Result Value Ref Range   Prothrombin Time 20.2 (H) 11.4 - 15.2 seconds   INR 1.70   CBC with Differential     Status: Abnormal   Collection Time: 02/13/16 10:49 AM  Result Value Ref Range   WBC 6.5 4.0 - 10.5 K/uL   RBC 4.70 4.22 - 5.81 MIL/uL   Hemoglobin 13.2 13.0 - 17.0 g/dL   HCT 40.5 39.0 - 52.0 %   MCV 86.2 78.0 - 100.0 fL   MCH 28.1 26.0 - 34.0 pg   MCHC 32.6 30.0 - 36.0 g/dL   RDW 15.0 11.5 - 15.5 %   Platelets 133 (L) 150 - 400 K/uL   Neutrophils Relative % 67 %   Neutro Abs 4.3 1.7 - 7.7 K/uL   Lymphocytes Relative 24 %   Lymphs Abs 1.6 0.7 - 4.0 K/uL   Monocytes Relative 8 %   Monocytes Absolute 0.5 0.1 - 1.0 K/uL   Eosinophils Relative 1 %   Eosinophils Absolute 0.1 0.0 - 0.7 K/uL   Basophils Relative 0 %   Basophils Absolute 0.0 0.0 - 0.1 K/uL  INR/PT     Status: Abnormal   Collection Time: 02/20/16 10:22 AM  Result Value Ref Range   Prothrombin Time 29.2 (H) 11.4 - 15.2 seconds   INR 2.69   CULTURE, ROUTINE-ABSCESS     Status: None   Collection Time: 02/25/16  3:30 PM  Result Value Ref Range   Culture Moderate  SERRATIA MARCESCENS  Gram Stain Moderate    Gram Stain WBC present-both PMN and Mononuclear    Gram Stain No Squamous Epithelial Cells Seen    Gram Stain No Organisms Seen    Organism ID, Bacteria SERRATIA MARCESCENS       Susceptibility   Serratia marcescens -  (no method available)    AMOX/CLAVULANIC  Resistant     CEFAZOLIN >=64 Resistant     CEFTRIAXONE <=1 Sensitive     CEFTAZIDIME <=1 Sensitive     CEFEPIME <=1 Sensitive     GENTAMICIN <=1 Sensitive     TOBRAMYCIN <=1 Sensitive     CIPROFLOXACIN 2 Intermediate     LEVOFLOXACIN 4 Intermediate     TRIMETH/SULFA* <=20 Sensitive      * NR=NOT REPORTABLE,SEE COMMENTORAL therapy:A cefazolin MIC of <32 predicts susceptibility to the oral agents cefaclor,cefdinir,cefpodoxime,cefprozil,cefuroxime,cephalexin,and loracarbef when used for therapy of uncomplicated UTIs due to E.coli,K.pneumomiae,and P.mirabilis. PARENTERAL therapy: A cefazolinMIC of >8 indicates resistance to parenteralcefazolin. An alternate test method must beperformed to confirm susceptibility to parenteralcefazolin.  INR/PT     Status: Abnormal   Collection Time: 02/25/16  3:53 PM  Result Value Ref Range   Prothrombin Time 28.6 (H) 11.4 - 15.2 seconds   INR 2.62   Protime-INR     Status: Abnormal   Collection Time: 03/09/16  6:11 PM  Result Value Ref Range   Prothrombin Time 21.1 (H) 11.4 - 15.2 seconds   INR 1.80   Lactate dehydrogenase     Status: Abnormal   Collection Time: 03/09/16  6:11 PM  Result Value Ref Range   LDH 224 (H) 98 - 192 U/L  MRSA PCR Screening     Status: None   Collection Time: 03/09/16  6:23 PM  Result Value Ref Range   MRSA by PCR NEGATIVE NEGATIVE    Comment:        The GeneXpert MRSA Assay (FDA approved for NASAL specimens only), is one component of a comprehensive MRSA colonization surveillance program. It is not intended to diagnose MRSA infection nor to guide or monitor treatment for MRSA infections.   Heparin level  (unfractionated)     Status: None   Collection Time: 03/10/16  4:32 AM  Result Value Ref Range   Heparin Unfractionated 0.48 0.30 - 0.70 IU/mL    Comment:        IF HEPARIN RESULTS ARE BELOW EXPECTED VALUES, AND PATIENT DOSAGE HAS BEEN CONFIRMED, SUGGEST FOLLOW UP TESTING OF ANTITHROMBIN III LEVELS.   Basic metabolic panel     Status: Abnormal   Collection Time: 03/10/16  4:32 AM  Result Value Ref Range   Sodium 138 135 - 145 mmol/L   Potassium 3.6 3.5 - 5.1 mmol/L   Chloride 105 101 - 111 mmol/L   CO2 27 22 - 32 mmol/L   Glucose, Bld 88 65 - 99 mg/dL   BUN 9 6 - 20 mg/dL   Creatinine, Ser 1.00 0.61 - 1.24 mg/dL   Calcium 8.5 (L) 8.9 - 10.3 mg/dL   GFR calc non Af Amer >60 >60 mL/min   GFR calc Af Amer >60 >60 mL/min    Comment: (NOTE) The eGFR has been calculated using the CKD EPI equation. This calculation has not been validated in all clinical situations. eGFR's persistently <60 mL/min signify possible Chronic Kidney Disease.    Anion gap 6 5 - 15  CBC     Status: Abnormal   Collection Time: 03/10/16  4:32 AM  Result Value Ref Range  WBC 6.4 4.0 - 10.5 K/uL   RBC 4.60 4.22 - 5.81 MIL/uL   Hemoglobin 13.1 13.0 - 17.0 g/dL   HCT 39.1 39.0 - 52.0 %   MCV 85.0 78.0 - 100.0 fL   MCH 28.5 26.0 - 34.0 pg   MCHC 33.5 30.0 - 36.0 g/dL   RDW 15.0 11.5 - 15.5 %   Platelets 146 (L) 150 - 400 K/uL  Lactate dehydrogenase     Status: Abnormal   Collection Time: 03/10/16  4:32 AM  Result Value Ref Range   LDH 194 (H) 98 - 192 U/L  Protime-INR     Status: Abnormal   Collection Time: 03/10/16  4:32 AM  Result Value Ref Range   Prothrombin Time 19.6 (H) 11.4 - 15.2 seconds   INR 1.63   Heparin level (unfractionated)     Status: Abnormal   Collection Time: 03/11/16  3:23 AM  Result Value Ref Range   Heparin Unfractionated 0.85 (H) 0.30 - 0.70 IU/mL    Comment:        IF HEPARIN RESULTS ARE BELOW EXPECTED VALUES, AND PATIENT DOSAGE HAS BEEN CONFIRMED, SUGGEST FOLLOW UP  TESTING OF ANTITHROMBIN III LEVELS.   CBC     Status: None   Collection Time: 03/11/16  3:23 AM  Result Value Ref Range   WBC 7.9 4.0 - 10.5 K/uL   RBC 5.03 4.22 - 5.81 MIL/uL   Hemoglobin 14.2 13.0 - 17.0 g/dL   HCT 42.6 39.0 - 52.0 %   MCV 84.7 78.0 - 100.0 fL   MCH 28.2 26.0 - 34.0 pg   MCHC 33.3 30.0 - 36.0 g/dL   RDW 15.1 11.5 - 15.5 %   Platelets 180 150 - 400 K/uL  Lactate dehydrogenase     Status: Abnormal   Collection Time: 03/11/16  3:23 AM  Result Value Ref Range   LDH 217 (H) 98 - 192 U/L  Protime-INR     Status: Abnormal   Collection Time: 03/11/16  3:23 AM  Result Value Ref Range   Prothrombin Time 16.3 (H) 11.4 - 15.2 seconds   INR 1.30   Heparin level (unfractionated)     Status: None   Collection Time: 03/11/16 10:56 AM  Result Value Ref Range   Heparin Unfractionated 0.62 0.30 - 0.70 IU/mL    Comment:        IF HEPARIN RESULTS ARE BELOW EXPECTED VALUES, AND PATIENT DOSAGE HAS BEEN CONFIRMED, SUGGEST FOLLOW UP TESTING OF ANTITHROMBIN III LEVELS.   Type and screen Excel     Status: None   Collection Time: 03/11/16  8:58 PM  Result Value Ref Range   ABO/RH(D) O POS    Antibody Screen NEG    Sample Expiration 03/14/2016   Surgical PCR screen     Status: None   Collection Time: 03/11/16 11:40 PM  Result Value Ref Range   MRSA, PCR NEGATIVE NEGATIVE   Staphylococcus aureus NEGATIVE NEGATIVE    Comment:        The Xpert SA Assay (FDA approved for NASAL specimens in patients over 59 years of age), is one component of a comprehensive surveillance program.  Test performance has been validated by [facility] for patients greater than or equal to 8 year old. It is not intended to diagnose infection nor to guide or monitor treatment.   Lactate dehydrogenase     Status: None   Collection Time: 03/12/16  2:13 AM  Result Value Ref Range  LDH 176 98 - 192 U/L  Protime-INR     Status: Abnormal   Collection Time: 03/12/16  2:13  AM  Result Value Ref Range   Prothrombin Time 15.5 (H) 11.4 - 15.2 seconds   INR 1.22   CBC     Status: None   Collection Time: 03/12/16  2:13 AM  Result Value Ref Range   WBC 7.5 4.0 - 10.5 K/uL   RBC 4.71 4.22 - 5.81 MIL/uL   Hemoglobin 13.4 13.0 - 17.0 g/dL   HCT 40.2 39.0 - 52.0 %   MCV 85.4 78.0 - 100.0 fL   MCH 28.5 26.0 - 34.0 pg   MCHC 33.3 30.0 - 36.0 g/dL   RDW 15.1 11.5 - 15.5 %   Platelets 161 150 - 400 K/uL  APTT     Status: Abnormal   Collection Time: 03/12/16  2:13 AM  Result Value Ref Range   aPTT 140 (H) 24 - 36 seconds    Comment:        IF BASELINE aPTT IS ELEVATED, SUGGEST PATIENT RISK ASSESSMENT BE USED TO DETERMINE APPROPRIATE ANTICOAGULANT THERAPY.   Blood gas, arterial     Status: None   Collection Time: 03/12/16  3:26 AM  Result Value Ref Range   pH, Arterial 7.404 7.350 - 7.450   pCO2 arterial 40.7 32.0 - 48.0 mmHg   pO2, Arterial 84.2 83.0 - 108.0 mmHg   Bicarbonate 24.9 20.0 - 28.0 mmol/L   Acid-Base Excess 0.7 0.0 - 2.0 mmol/L   O2 Saturation 95.8 %   Patient temperature 98.6    Collection site RIGHT RADIAL    Drawn by [phone]    Sample type ARTERIAL DRAW    Allens test (pass/fail) PASS PASS  Urinalysis, Routine w reflex microscopic     Status: None   Collection Time: 03/12/16  5:21 AM  Result Value Ref Range   Color, Urine YELLOW YELLOW   APPearance CLEAR CLEAR   Specific Gravity, Urine 1.011 1.005 - 1.030   pH 6.0 5.0 - 8.0   Glucose, UA NEGATIVE NEGATIVE mg/dL   Hgb urine dipstick NEGATIVE NEGATIVE   Bilirubin Urine NEGATIVE NEGATIVE   Ketones, ur NEGATIVE NEGATIVE mg/dL   Protein, ur NEGATIVE NEGATIVE mg/dL   Nitrite NEGATIVE NEGATIVE   Leukocytes, UA NEGATIVE NEGATIVE  Anaerobic culture     Status: None   Collection Time: 03/12/16  8:18 AM  Result Value Ref Range   Specimen Description WOUND ABDOMEN    Special Requests SPEC A    Culture NO ANAEROBES ISOLATED    Report Status 03/17/2016 FINAL   Aerobic Culture (superficial  specimen)     Status: None   Collection Time: 03/12/16  8:18 AM  Result Value Ref Range   Specimen Description WOUND ABDOMEN    Special Requests SPEC A    Gram Stain      ABUNDANT WBC PRESENT, PREDOMINANTLY PMN NO ORGANISMS SEEN    Culture      FEW SERRATIA MARCESCENS CRITICAL RESULT CALLED TO, READ BACK BY AND VERIFIED WITH: RN K.JOHNSON [id number] MLM    Report Status 03/14/2016 FINAL    Organism ID, Bacteria SERRATIA MARCESCENS       Susceptibility   Serratia marcescens - MIC*    CEFAZOLIN >=64 RESISTANT Resistant     CEFEPIME <=1 SENSITIVE Sensitive     CEFTAZIDIME <=1 SENSITIVE Sensitive     CEFTRIAXONE <=1 SENSITIVE Sensitive     CIPROFLOXACIN 2 INTERMEDIATE Intermediate  GENTAMICIN <=1 SENSITIVE Sensitive     TRIMETH/SULFA <=20 SENSITIVE Sensitive     * FEW SERRATIA MARCESCENS  Aerobic/Anaerobic Culture (surgical/deep wound)     Status: None   Collection Time: 03/12/16  8:24 AM  Result Value Ref Range   Specimen Description TISSUE ABDOMEN    Special Requests SPEC B    Gram Stain      FEW WBC PRESENT,BOTH PMN AND MONONUCLEAR NO ORGANISMS SEEN    Culture      FEW SERRATIA MARCESCENS CRITICAL RESULT CALLED TO, READ BACK BY AND VERIFIED WITH: RN K.JOHNSON [id number] MLM NO ANAEROBES ISOLATED    Report Status 03/17/2016 FINAL    Organism ID, Bacteria SERRATIA MARCESCENS       Susceptibility   Serratia marcescens - MIC*    CEFAZOLIN >=64 RESISTANT Resistant     CEFEPIME <=1 SENSITIVE Sensitive     CEFTAZIDIME <=1 SENSITIVE Sensitive     CEFTRIAXONE <=1 SENSITIVE Sensitive     CIPROFLOXACIN 2 INTERMEDIATE Intermediate     GENTAMICIN <=1 SENSITIVE Sensitive     TRIMETH/SULFA <=20 SENSITIVE Sensitive     * FEW SERRATIA MARCESCENS  Heparin level (unfractionated)     Status: None   Collection Time: 03/12/16  9:34 PM  Result Value Ref Range   Heparin Unfractionated 0.30 0.30 - 0.70 IU/mL    Comment:        IF HEPARIN RESULTS ARE BELOW EXPECTED VALUES, AND  PATIENT DOSAGE HAS BEEN CONFIRMED, SUGGEST FOLLOW UP TESTING OF ANTITHROMBIN III LEVELS.   Lactate dehydrogenase     Status: None   Collection Time: 03/13/16  1:35 AM  Result Value Ref Range   LDH 179 98 - 192 U/L  Protime-INR     Status: Abnormal   Collection Time: 03/13/16  1:35 AM  Result Value Ref Range   Prothrombin Time 15.4 (H) 11.4 - 15.2 seconds   INR 1.22   CBC     Status: Abnormal   Collection Time: 03/13/16  1:35 AM  Result Value Ref Range   WBC 8.4 4.0 - 10.5 K/uL   RBC 4.70 4.22 - 5.81 MIL/uL   Hemoglobin 13.3 13.0 - 17.0 g/dL   HCT 40.0 39.0 - 52.0 %   MCV 85.1 78.0 - 100.0 fL   MCH 28.3 26.0 - 34.0 pg   MCHC 33.3 30.0 - 36.0 g/dL   RDW 15.4 11.5 - 15.5 %   Platelets 148 (L) 150 - 400 K/uL  Basic metabolic panel     Status: Abnormal   Collection Time: 03/13/16  1:35 AM  Result Value Ref Range   Sodium 134 (L) 135 - 145 mmol/L   Potassium 3.7 3.5 - 5.1 mmol/L   Chloride 101 101 - 111 mmol/L   CO2 26 22 - 32 mmol/L   Glucose, Bld 112 (H) 65 - 99 mg/dL   BUN 9 6 - 20 mg/dL   Creatinine, Ser 1.13 0.61 - 1.24 mg/dL   Calcium 8.6 (L) 8.9 - 10.3 mg/dL   GFR calc non Af Amer >60 >60 mL/min   GFR calc Af Amer >60 >60 mL/min    Comment: (NOTE) The eGFR has been calculated using the CKD EPI equation. This calculation has not been validated in all clinical situations. eGFR's persistently <60 mL/min signify possible Chronic Kidney Disease.    Anion gap 7 5 - 15  Heparin level (unfractionated)     Status: Abnormal   Collection Time: 03/13/16  8:04 AM  Result Value Ref Range  Heparin Unfractionated 0.25 (L) 0.30 - 0.70 IU/mL    Comment:        IF HEPARIN RESULTS ARE BELOW EXPECTED VALUES, AND PATIENT DOSAGE HAS BEEN CONFIRMED, SUGGEST FOLLOW UP TESTING OF ANTITHROMBIN III LEVELS.   Heparin level (unfractionated)     Status: Abnormal   Collection Time: 03/13/16 10:36 PM  Result Value Ref Range   Heparin Unfractionated 0.20 (L) 0.30 - 0.70 IU/mL    Comment:         IF HEPARIN RESULTS ARE BELOW EXPECTED VALUES, AND PATIENT DOSAGE HAS BEEN CONFIRMED, SUGGEST FOLLOW UP TESTING OF ANTITHROMBIN III LEVELS.   Lactate dehydrogenase     Status: None   Collection Time: 03/14/16  2:02 AM  Result Value Ref Range   LDH 192 98 - 192 U/L  Protime-INR     Status: Abnormal   Collection Time: 03/14/16  2:02 AM  Result Value Ref Range   Prothrombin Time 15.4 (H) 11.4 - 15.2 seconds   INR 1.21   CBC     Status: Abnormal   Collection Time: 03/14/16  2:02 AM  Result Value Ref Range   WBC 8.9 4.0 - 10.5 K/uL   RBC 4.63 4.22 - 5.81 MIL/uL   Hemoglobin 13.1 13.0 - 17.0 g/dL   HCT 40.0 39.0 - 52.0 %   MCV 86.4 78.0 - 100.0 fL   MCH 28.3 26.0 - 34.0 pg   MCHC 32.8 30.0 - 36.0 g/dL   RDW 15.4 11.5 - 15.5 %   Platelets 149 (L) 150 - 400 K/uL  Comprehensive metabolic panel     Status: Abnormal   Collection Time: 03/14/16  2:02 AM  Result Value Ref Range   Sodium 135 135 - 145 mmol/L   Potassium 3.9 3.5 - 5.1 mmol/L   Chloride 102 101 - 111 mmol/L   CO2 22 22 - 32 mmol/L   Glucose, Bld 95 65 - 99 mg/dL   BUN 12 6 - 20 mg/dL   Creatinine, Ser 1.21 0.61 - 1.24 mg/dL   Calcium 8.8 (L) 8.9 - 10.3 mg/dL   Total Protein 6.5 6.5 - 8.1 g/dL   Albumin 3.6 3.5 - 5.0 g/dL   AST 15 15 - 41 U/L   ALT 13 (L) 17 - 63 U/L   Alkaline Phosphatase 79 38 - 126 U/L   Total Bilirubin 0.6 0.3 - 1.2 mg/dL   GFR calc non Af Amer >60 >60 mL/min   GFR calc Af Amer >60 >60 mL/min    Comment: (NOTE) The eGFR has been calculated using the CKD EPI equation. This calculation has not been validated in all clinical situations. eGFR's persistently <60 mL/min signify possible Chronic Kidney Disease.    Anion gap 11 5 - 15  Heparin level (unfractionated)     Status: Abnormal   Collection Time: 03/14/16  2:02 AM  Result Value Ref Range   Heparin Unfractionated 0.17 (L) 0.30 - 0.70 IU/mL    Comment:        IF HEPARIN RESULTS ARE BELOW EXPECTED VALUES, AND PATIENT DOSAGE HAS  BEEN CONFIRMED, SUGGEST FOLLOW UP TESTING OF ANTITHROMBIN III LEVELS.   Lactate dehydrogenase     Status: None   Collection Time: 03/15/16  3:53 AM  Result Value Ref Range   LDH 174 98 - 192 U/L  CBC     Status: Abnormal   Collection Time: 03/15/16  3:53 AM  Result Value Ref Range   WBC 7.9 4.0 - 10.5 K/uL   RBC  4.46 4.22 - 5.81 MIL/uL   Hemoglobin 12.8 (L) 13.0 - 17.0 g/dL   HCT 38.5 (L) 39.0 - 52.0 %   MCV 86.3 78.0 - 100.0 fL   MCH 28.7 26.0 - 34.0 pg   MCHC 33.2 30.0 - 36.0 g/dL   RDW 15.6 (H) 11.5 - 15.5 %   Platelets 145 (L) 150 - 400 K/uL  Basic metabolic panel     Status: Abnormal   Collection Time: 03/15/16  3:53 AM  Result Value Ref Range   Sodium 136 135 - 145 mmol/L   Potassium 4.0 3.5 - 5.1 mmol/L   Chloride 105 101 - 111 mmol/L   CO2 24 22 - 32 mmol/L   Glucose, Bld 91 65 - 99 mg/dL   BUN 10 6 - 20 mg/dL   Creatinine, Ser 1.02 0.61 - 1.24 mg/dL   Calcium 8.8 (L) 8.9 - 10.3 mg/dL   GFR calc non Af Amer >60 >60 mL/min   GFR calc Af Amer >60 >60 mL/min    Comment: (NOTE) The eGFR has been calculated using the CKD EPI equation. This calculation has not been validated in all clinical situations. eGFR's persistently <60 mL/min signify possible Chronic Kidney Disease.    Anion gap 7 5 - 15  Heparin level (unfractionated)     Status: Abnormal   Collection Time: 03/15/16  4:00 AM  Result Value Ref Range   Heparin Unfractionated 0.13 (L) 0.30 - 0.70 IU/mL    Comment:        IF HEPARIN RESULTS ARE BELOW EXPECTED VALUES, AND PATIENT DOSAGE HAS BEEN CONFIRMED, SUGGEST FOLLOW UP TESTING OF ANTITHROMBIN III LEVELS.   Protime-INR     Status: Abnormal   Collection Time: 03/15/16  4:00 AM  Result Value Ref Range   Prothrombin Time 15.4 (H) 11.4 - 15.2 seconds   INR 1.22   Lactate dehydrogenase     Status: None   Collection Time: 03/16/16  2:09 AM  Result Value Ref Range   LDH 179 98 - 192 U/L  CBC     Status: Abnormal   Collection Time: 03/16/16  2:09 AM    Result Value Ref Range   WBC 7.9 4.0 - 10.5 K/uL   RBC 4.37 4.22 - 5.81 MIL/uL   Hemoglobin 12.4 (L) 13.0 - 17.0 g/dL   HCT 37.8 (L) 39.0 - 52.0 %   MCV 86.5 78.0 - 100.0 fL   MCH 28.4 26.0 - 34.0 pg   MCHC 32.8 30.0 - 36.0 g/dL   RDW 15.8 (H) 11.5 - 15.5 %   Platelets 137 (L) 150 - 400 K/uL  Basic metabolic panel     Status: Abnormal   Collection Time: 03/16/16  2:09 AM  Result Value Ref Range   Sodium 137 135 - 145 mmol/L   Potassium 4.3 3.5 - 5.1 mmol/L   Chloride 105 101 - 111 mmol/L   CO2 25 22 - 32 mmol/L   Glucose, Bld 88 65 - 99 mg/dL   BUN 10 6 - 20 mg/dL   Creatinine, Ser 1.03 0.61 - 1.24 mg/dL   Calcium 8.8 (L) 8.9 - 10.3 mg/dL   GFR calc non Af Amer >60 >60 mL/min   GFR calc Af Amer >60 >60 mL/min    Comment: (NOTE) The eGFR has been calculated using the CKD EPI equation. This calculation has not been validated in all clinical situations. eGFR's persistently <60 mL/min signify possible Chronic Kidney Disease.    Anion gap 7 5 - 15  Heparin level (unfractionated)     Status: Abnormal   Collection Time: 03/16/16  2:09 AM  Result Value Ref Range   Heparin Unfractionated <0.10 (L) 0.30 - 0.70 IU/mL    Comment:        IF HEPARIN RESULTS ARE BELOW EXPECTED VALUES, AND PATIENT DOSAGE HAS BEEN CONFIRMED, SUGGEST FOLLOW UP TESTING OF ANTITHROMBIN III LEVELS.   Protime-INR     Status: Abnormal   Collection Time: 03/16/16  2:09 AM  Result Value Ref Range   Prothrombin Time 16.2 (H) 11.4 - 15.2 seconds   INR 1.29   Lactate dehydrogenase     Status: None   Collection Time: 03/17/16  4:20 AM  Result Value Ref Range   LDH 168 98 - 192 U/L  Protime-INR     Status: Abnormal   Collection Time: 03/17/16  4:20 AM  Result Value Ref Range   Prothrombin Time 16.9 (H) 11.4 - 15.2 seconds   INR 1.36   CBC     Status: Abnormal   Collection Time: 03/17/16  4:20 AM  Result Value Ref Range   WBC 8.1 4.0 - 10.5 K/uL   RBC 4.55 4.22 - 5.81 MIL/uL   Hemoglobin 12.7 (L) 13.0  - 17.0 g/dL   HCT 39.7 39.0 - 52.0 %   MCV 87.3 78.0 - 100.0 fL   MCH 27.9 26.0 - 34.0 pg   MCHC 32.0 30.0 - 36.0 g/dL   RDW 15.8 (H) 11.5 - 15.5 %   Platelets 138 (L) 150 - 400 K/uL  Basic metabolic panel     Status: None   Collection Time: 03/17/16  4:20 AM  Result Value Ref Range   Sodium 138 135 - 145 mmol/L   Potassium 4.0 3.5 - 5.1 mmol/L   Chloride 105 101 - 111 mmol/L   CO2 25 22 - 32 mmol/L   Glucose, Bld 93 65 - 99 mg/dL   BUN 12 6 - 20 mg/dL   Creatinine, Ser 0.95 0.61 - 1.24 mg/dL   Calcium 8.9 8.9 - 10.3 mg/dL   GFR calc non Af Amer >60 >60 mL/min   GFR calc Af Amer >60 >60 mL/min    Comment: (NOTE) The eGFR has been calculated using the CKD EPI equation. This calculation has not been validated in all clinical situations. eGFR's persistently <60 mL/min signify possible Chronic Kidney Disease.    Anion gap 8 5 - 15  Heparin level (unfractionated)     Status: Abnormal   Collection Time: 03/17/16  4:20 AM  Result Value Ref Range   Heparin Unfractionated 0.17 (L) 0.30 - 0.70 IU/mL    Comment:        IF HEPARIN RESULTS ARE BELOW EXPECTED VALUES, AND PATIENT DOSAGE HAS BEEN CONFIRMED, SUGGEST FOLLOW UP TESTING OF ANTITHROMBIN III LEVELS.   Heparin level (unfractionated)     Status: Abnormal   Collection Time: 03/18/16  3:57 AM  Result Value Ref Range   Heparin Unfractionated 0.22 (L) 0.30 - 0.70 IU/mL    Comment:        IF HEPARIN RESULTS ARE BELOW EXPECTED VALUES, AND PATIENT DOSAGE HAS BEEN CONFIRMED, SUGGEST FOLLOW UP TESTING OF ANTITHROMBIN III LEVELS.   CBC     Status: Abnormal   Collection Time: 03/18/16  3:57 AM  Result Value Ref Range   WBC 7.3 4.0 - 10.5 K/uL   RBC 4.58 4.22 - 5.81 MIL/uL   Hemoglobin 13.1 13.0 - 17.0 g/dL   HCT 39.5 39.0 - 52.0 %  MCV 86.2 78.0 - 100.0 fL   MCH 28.6 26.0 - 34.0 pg   MCHC 33.2 30.0 - 36.0 g/dL   RDW 15.8 (H) 11.5 - 15.5 %   Platelets 135 (L) 150 - 400 K/uL  Lactate dehydrogenase     Status: None    Collection Time: 03/18/16  3:57 AM  Result Value Ref Range   LDH 171 98 - 192 U/L  Protime-INR     Status: Abnormal   Collection Time: 03/18/16  3:57 AM  Result Value Ref Range   Prothrombin Time 18.0 (H) 11.4 - 15.2 seconds   INR 1.47   Lactate dehydrogenase     Status: None   Collection Time: 03/19/16  5:00 AM  Result Value Ref Range   LDH 179 98 - 192 U/L  Protime-INR     Status: Abnormal   Collection Time: 03/19/16  5:00 AM  Result Value Ref Range   Prothrombin Time 19.8 (H) 11.4 - 15.2 seconds   INR 1.66   CBC     Status: Abnormal   Collection Time: 03/19/16  5:00 AM  Result Value Ref Range   WBC 8.3 4.0 - 10.5 K/uL   RBC 4.71 4.22 - 5.81 MIL/uL   Hemoglobin 13.2 13.0 - 17.0 g/dL   HCT 41.1 39.0 - 52.0 %   MCV 87.3 78.0 - 100.0 fL   MCH 28.0 26.0 - 34.0 pg   MCHC 32.1 30.0 - 36.0 g/dL   RDW 15.9 (H) 11.5 - 15.5 %   Platelets 147 (L) 150 - 400 K/uL  Heparin level (unfractionated)     Status: Abnormal   Collection Time: 03/19/16  5:00 AM  Result Value Ref Range   Heparin Unfractionated 0.22 (L) 0.30 - 0.70 IU/mL    Comment:        IF HEPARIN RESULTS ARE BELOW EXPECTED VALUES, AND PATIENT DOSAGE HAS BEEN CONFIRMED, SUGGEST FOLLOW UP TESTING OF ANTITHROMBIN III LEVELS.   Lactate dehydrogenase     Status: Abnormal   Collection Time: 03/20/16  4:13 AM  Result Value Ref Range   LDH 195 (H) 98 - 192 U/L  Protime-INR     Status: Abnormal   Collection Time: 03/20/16  4:13 AM  Result Value Ref Range   Prothrombin Time 22.0 (H) 11.4 - 15.2 seconds   INR 1.90   CBC     Status: Abnormal   Collection Time: 03/20/16  4:13 AM  Result Value Ref Range   WBC 7.7 4.0 - 10.5 K/uL   RBC 4.82 4.22 - 5.81 MIL/uL   Hemoglobin 13.5 13.0 - 17.0 g/dL   HCT 41.4 39.0 - 52.0 %   MCV 85.9 78.0 - 100.0 fL   MCH 28.0 26.0 - 34.0 pg   MCHC 32.6 30.0 - 36.0 g/dL   RDW 15.7 (H) 11.5 - 15.5 %   Platelets 161 150 - 400 K/uL  Heparin level (unfractionated)     Status: Abnormal   Collection  Time: 03/20/16  4:13 AM  Result Value Ref Range   Heparin Unfractionated 0.25 (L) 0.30 - 0.70 IU/mL    Comment:        IF HEPARIN RESULTS ARE BELOW EXPECTED VALUES, AND PATIENT DOSAGE HAS BEEN CONFIRMED, SUGGEST FOLLOW UP TESTING OF ANTITHROMBIN III LEVELS.   Basic metabolic panel     Status: Abnormal   Collection Time: 03/20/16  4:13 AM  Result Value Ref Range   Sodium 136 135 - 145 mmol/L   Potassium 4.2 3.5 - 5.1  mmol/L   Chloride 103 101 - 111 mmol/L   CO2 20 (L) 22 - 32 mmol/L   Glucose, Bld 85 65 - 99 mg/dL   BUN 13 6 - 20 mg/dL   Creatinine, Ser 1.02 0.61 - 1.24 mg/dL   Calcium 9.2 8.9 - 10.3 mg/dL   GFR calc non Af Amer >60 >60 mL/min   GFR calc Af Amer >60 >60 mL/min    Comment: (NOTE) The eGFR has been calculated using the CKD EPI equation. This calculation has not been validated in all clinical situations. eGFR's persistently <60 mL/min signify possible Chronic Kidney Disease.    Anion gap 13 5 - 15  POCT INR     Status: None   Collection Time: 03/23/16 12:00 AM  Result Value Ref Range   INR 1.6   CBC w/Diff/Platelet     Status: None   Collection Time: 03/24/16  1:48 PM  Result Value Ref Range   WBC 7.3 4.0 - 10.5 K/uL   RBC 4.83 4.22 - 5.81 MIL/uL   Hemoglobin 13.7 13.0 - 17.0 g/dL   HCT 41.8 39.0 - 52.0 %   MCV 86.5 78.0 - 100.0 fL   MCH 28.4 26.0 - 34.0 pg   MCHC 32.8 30.0 - 36.0 g/dL   RDW 15.0 11.5 - 15.5 %   Platelets 177 150 - 400 K/uL   Neutrophils Relative % 62 %   Neutro Abs 4.5 1.7 - 7.7 K/uL   Lymphocytes Relative 27 %   Lymphs Abs 2.0 0.7 - 4.0 K/uL   Monocytes Relative 9 %   Monocytes Absolute 0.6 0.1 - 1.0 K/uL   Eosinophils Relative 2 %   Eosinophils Absolute 0.1 0.0 - 0.7 K/uL   Basophils Relative 0 %   Basophils Absolute 0.0 0.0 - 0.1 K/uL  Lactate Dehydrogenase (LDH)     Status: Abnormal   Collection Time: 03/24/16  1:48 PM  Result Value Ref Range   LDH 230 (H) 98 - 192 U/L  INR/PT     Status: Abnormal   Collection Time:  03/24/16  1:48 PM  Result Value Ref Range   Prothrombin Time 19.6 (H) 11.4 - 15.2 seconds   INR 1.63   B Nat Peptide     Status: None   Collection Time: 03/24/16  1:48 PM  Result Value Ref Range   B Natriuretic Peptide 37.6 0.0 - 100.0 pg/mL  Comprehensive Metabolic Panel (CMET)     Status: None   Collection Time: 03/24/16  1:51 PM  Result Value Ref Range   Sodium 139 135 - 145 mmol/L   Potassium 3.9 3.5 - 5.1 mmol/L    Comment: SLIGHT HEMOLYSIS   Chloride 107 101 - 111 mmol/L   CO2 25 22 - 32 mmol/L   Glucose, Bld 96 65 - 99 mg/dL   BUN 10 6 - 20 mg/dL   Creatinine, Ser 1.14 0.61 - 1.24 mg/dL   Calcium 9.1 8.9 - 10.3 mg/dL   Total Protein 7.1 6.5 - 8.1 g/dL   Albumin 4.1 3.5 - 5.0 g/dL   AST 25 15 - 41 U/L   ALT 20 17 - 63 U/L   Alkaline Phosphatase 90 38 - 126 U/L   Total Bilirubin 0.8 0.3 - 1.2 mg/dL   GFR calc non Af Amer >60 >60 mL/min   GFR calc Af Amer >60 >60 mL/min    Comment: (NOTE) The eGFR has been calculated using the CKD EPI equation. This calculation has not been validated in  all clinical situations. eGFR's persistently <60 mL/min signify possible Chronic Kidney Disease.    Anion gap 7 5 - 15  Protime-INR     Status: Abnormal   Collection Time: 03/25/16  2:45 AM  Result Value Ref Range   Prothrombin Time 21.2 (H) 11.4 - 15.2 seconds   INR 1.80   CBC     Status: Abnormal   Collection Time: 03/25/16  2:45 AM  Result Value Ref Range   WBC 7.8 4.0 - 10.5 K/uL   RBC 4.43 4.22 - 5.81 MIL/uL   Hemoglobin 12.6 (L) 13.0 - 17.0 g/dL   HCT 38.4 (L) 39.0 - 52.0 %   MCV 86.7 78.0 - 100.0 fL   MCH 28.4 26.0 - 34.0 pg   MCHC 32.8 30.0 - 36.0 g/dL   RDW 15.1 11.5 - 15.5 %   Platelets 173 150 - 400 K/uL  CBC     Status: Abnormal   Collection Time: 03/26/16 11:20 AM  Result Value Ref Range   WBC 7.2 4.0 - 10.5 K/uL   RBC 4.28 4.22 - 5.81 MIL/uL   Hemoglobin 12.3 (L) 13.0 - 17.0 g/dL   HCT 37.0 (L) 39.0 - 52.0 %   MCV 86.4 78.0 - 100.0 fL   MCH 28.7 26.0 - 34.0  pg   MCHC 33.2 30.0 - 36.0 g/dL   RDW 15.2 11.5 - 15.5 %   Platelets 166 150 - 400 K/uL  INR/PT     Status: Abnormal   Collection Time: 03/26/16 11:20 AM  Result Value Ref Range   Prothrombin Time 21.4 (H) 11.4 - 15.2 seconds   INR 1.83   Lactate Dehydrogenase (LDH)     Status: Abnormal   Collection Time: 03/26/16 11:20 AM  Result Value Ref Range   LDH 205 (H) 98 - 192 U/L    ASSESSMENT AND PLAN:   1) Chronic systolic HF, s/p HMII LVAD implant 05/2012.  - He is doing well. NYHA I-II. - Listed 1A at [facility]. Has follow up at [facility] tomorrow.   - He is not on a BB. He was able to tolerate low-doseToprol before LVAD, however has been intolerant since that time. Volume status stable.  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 100% granulation. No odor. Applied 1 gram A Cell, mepitel, VAC 125 mm hg. Plan to change again next week. May need cut back pressure to 75 mm hg if bleeding occurs after dressing change.  Deep wound culture showed Serratia again. Continue ceftriaxone 2 grams every 12 hours per ID. Plan for ceftriaxone at least 6 wks.  - He will follow up with Robert [name] the end of January.   3) HTN-  - MAP stable. Continue current dose of entresto, amlodipine, and  Hydralazine.   improved with Entresto 97/03 bid.  3) Hyperthyroidism - Amio induced. Follows with Robert. Forde Dandy. Amio stopped. Recent TSH 02/2016 1.422   4) NSVT/VT  - Off amio. On mexilitene. - ICD now at EOL. 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  6) Anticoagulation with coumadin  - Continue coumadin and ASA 325 mg for LVAD. 7) LVAD  - Stable. Parameters stable.   CBC and INR . INR 1.83.   Bedside dressing with Robert Darcey Nora. Cauterized at bedside. Applied NS dressing. Plan to leave dressing  in  placed until next Tuesday. Follow up next Tuesday. No lovenox.   Amy Clegg NP-C  12:22 PM  Patient seen and examined with Darrick Grinder, NP. We discussed all aspects of the encounter. I agree with the assessment and plan as stated above.   He presents with recurrent driveline bleeding after being seen in ER x 2 over past 36 hours. Hemoglobin drifting down. Robert. Prescott Gum performed eletrocautery in clinic with excellent hemostasis. Will hold anticoagulation for several days and then restart coumadin if no bleeding in 48 hours. No lovenox. Volume status and VAD parameters look good.   Kerryann Allaire,MD 8:33 PM

## 2016-03-26 NOTE — Patient Instructions (Addendum)
NO COUMADIN OR LOVENOX until seen this Tuesday (03/30/2016) for driveline site follow up.  ONLY take your aspirin 325 mg once daily as prescribed.  Call VAD pager for any signs of bleeding, do not remove dressing.  Follow up Tuesday 03/30/2016 at ________________________________________ Code #0040

## 2016-03-30 ENCOUNTER — Ambulatory Visit (HOSPITAL_COMMUNITY): Payer: Self-pay | Admitting: Pharmacist

## 2016-03-30 ENCOUNTER — Ambulatory Visit (HOSPITAL_COMMUNITY)
Admission: RE | Admit: 2016-03-30 | Discharge: 2016-03-30 | Disposition: A | Discharge status: Home / Self Care | Payer: Medicaid Other | Source: Ambulatory Visit | Attending: Cardiology | Admitting: Cardiology

## 2016-03-30 DIAGNOSIS — Z7901 Long term (current) use of anticoagulants: Secondary | ICD-10-CM | POA: Diagnosis not present

## 2016-03-30 DIAGNOSIS — Z95811 Presence of heart assist device: Secondary | ICD-10-CM | POA: Diagnosis not present

## 2016-03-30 LAB — PROTIME-INR
INR: 1.06
PROTHROMBIN TIME: 13.8 s (ref 11.4–15.2)

## 2016-03-30 LAB — LACTATE DEHYDROGENASE: LDH: 176 U/L (ref 98–192)

## 2016-03-30 NOTE — Progress Notes (Signed)
Driveline dressing change done in VAD clinic today with Dr. Donata Clay. Bleeding controlled. INR / LDH done per Dr. Gala Romney and will resume coumadin today. Back Friday for Dr. Donata Clay to repack with 500 g Acell.   Rexene Alberts, RN VAD Coordinator   Office: [phone] 24/7 Emergency VAD Pager: [phone]

## 2016-03-31 ENCOUNTER — Inpatient Hospital Stay (HOSPITAL_COMMUNITY): Admission: RE | Admit: 2016-03-31 | Payer: Medicaid Other | Source: Ambulatory Visit

## 2016-04-02 ENCOUNTER — Ambulatory Visit (HOSPITAL_COMMUNITY): Payer: Self-pay | Admitting: Pharmacist

## 2016-04-02 ENCOUNTER — Ambulatory Visit (HOSPITAL_COMMUNITY)
Admission: RE | Admit: 2016-04-02 | Discharge: 2016-04-02 | Disposition: A | Discharge status: Home / Self Care | Payer: Medicaid Other | Source: Ambulatory Visit | Attending: Cardiology | Admitting: Cardiology

## 2016-04-02 ENCOUNTER — Other Ambulatory Visit (HOSPITAL_COMMUNITY): Payer: Self-pay

## 2016-04-02 DIAGNOSIS — Z95811 Presence of heart assist device: Secondary | ICD-10-CM

## 2016-04-02 DIAGNOSIS — Z7901 Long term (current) use of anticoagulants: Secondary | ICD-10-CM | POA: Insufficient documentation

## 2016-04-02 LAB — PROTIME-INR
INR: 1.19
Prothrombin Time: 15.2 seconds (ref 11.4–15.2)

## 2016-04-02 LAB — LACTATE DEHYDROGENASE: LDH: 186 U/L (ref 98–192)

## 2016-04-02 MED ORDER — WARFARIN SODIUM 5 MG PO TABS
ORAL_TABLET | ORAL | 3 refills | Status: DC
Start: 2016-04-02 — End: 2016-05-19

## 2016-04-02 MED FILL — WARFARIN SODIUM 5 MG TABLET: 5 | 30 days supply | Qty: 30 | Fill #0

## 2016-04-02 NOTE — Progress Notes (Signed)
Driveline dressing change done in VAD clinic today with Dr. Donata Clay. Bleeding controlled. INR / LDH done. Back Tuesday for Amy to reapply 500 mg Acell to wound Then plan on QWeek.    Rexene Alberts, RN VAD Coordinator   Office: [phone] 24/7 Emergency VAD Pager: [phone]

## 2016-04-06 ENCOUNTER — Ambulatory Visit (HOSPITAL_COMMUNITY): Payer: Self-pay | Admitting: Infectious Diseases

## 2016-04-06 ENCOUNTER — Ambulatory Visit (HOSPITAL_COMMUNITY)
Admission: RE | Admit: 2016-04-06 | Discharge: 2016-04-06 | Disposition: A | Discharge status: Home / Self Care | Payer: Medicaid Other | Source: Ambulatory Visit | Attending: Internal Medicine | Admitting: Internal Medicine

## 2016-04-06 VITALS — BP 105/85 | HR 72

## 2016-04-06 DIAGNOSIS — Z79899 Other long term (current) drug therapy: Secondary | ICD-10-CM | POA: Insufficient documentation

## 2016-04-06 DIAGNOSIS — Z95811 Presence of heart assist device: Secondary | ICD-10-CM | POA: Insufficient documentation

## 2016-04-06 DIAGNOSIS — T829XXD Unspecified complication of cardiac and vascular prosthetic device, implant and graft, subsequent encounter: Secondary | ICD-10-CM | POA: Diagnosis not present

## 2016-04-06 DIAGNOSIS — Z7982 Long term (current) use of aspirin: Secondary | ICD-10-CM | POA: Diagnosis not present

## 2016-04-06 DIAGNOSIS — I509 Heart failure, unspecified: Secondary | ICD-10-CM | POA: Insufficient documentation

## 2016-04-06 DIAGNOSIS — Z7901 Long term (current) use of anticoagulants: Secondary | ICD-10-CM | POA: Diagnosis not present

## 2016-04-06 DIAGNOSIS — A498 Other bacterial infections of unspecified site: Secondary | ICD-10-CM | POA: Diagnosis not present

## 2016-04-06 DIAGNOSIS — Z4801 Encounter for change or removal of surgical wound dressing: Secondary | ICD-10-CM | POA: Diagnosis present

## 2016-04-06 DIAGNOSIS — I110 Hypertensive heart disease with heart failure: Secondary | ICD-10-CM | POA: Insufficient documentation

## 2016-04-06 DIAGNOSIS — I428 Other cardiomyopathies: Secondary | ICD-10-CM | POA: Insufficient documentation

## 2016-04-06 LAB — PROTIME-INR
INR: 2.3
PROTHROMBIN TIME: 25.7 s — AB (ref 11.4–15.2)

## 2016-04-06 LAB — LACTATE DEHYDROGENASE: LDH: 225 U/L — ABNORMAL HIGH (ref 98–192)

## 2016-04-06 NOTE — Progress Notes (Signed)
Patient presents for follow up in VAD Clinic today. Reports no problems with VAD equipment.   Vital Signs:  Doppler Pressure: 100 Automatc BP: 105/85 (92) HR:  72 SPO2: 100 %  VAD Indication: Bridge to Transplant - Evaluation completed at [facility] with IB listing 05/30/2013; dual listed with [facility] 12/04/2014.   1A [facility].    VAD interrogation & Equipment Management: Speed: 9600 Flow: 5.3 Power: 6.0 w    PI: 6.3  Alarms: no clinical alarms  Events: 5 - 10 daily   Fixed speed: 9600 Low speed limit: 9000  Primary Controller:  Replace back up battery in 16 months. Back up controller:   Replace back up battery in 15 months.  Annual Equipment Maintenance on UBC/PM was performed on 06/19/2015.   I reviewed the LVAD parameters from today and compared the results to the patient's prior recorded data with Tonye Becket, NP. LVAD interrogation was NEGATIVE for significant power changes, NEGATIVE for clinical alarms and STABLE for PI events/speed drops. No programming changes were made and pump is functioning within specified parameters. Pt is performing daily controller and system monitor self tests along with completing weekly and monthly maintenance for LVAD equipment.  LVAD equipment check completed and is in good working order. Back-up equipment present. Charged back up battery and performed self-test on equipment.   Exit Site Care: Drive line changed per [employer] today. 500 mg Acell in wound.    Significant Events on VAD Support:  08/26/15 >> Serratia marcescens (rare) >> Cipro 500 mg bid x 7 days  09/29/15 >> Serratia marcescens (abundant) >> Cipro 500 mg BID x14d 11/20/15 >> Serratia marcescens (few) Admitted IV ceftriaxone >> cipro 500 mg twice daily x 30 days 02/03/16 >> Serratia species >> Cipro 500 mg BID x 14d; referral to ID 02/13/16 03/11/2016 >> driveline debridement, IV antibiotics   Device: Medtronic Dual  Therapies: on Last check: 08/05/2014 (assessed Optivol  02/05/16)  BP & Labs:  MAP 82 - Doppler is reflecting modified systolic  Hgb 13.7 - Some bleeding from VAD after cleaning/re-dressing. Specifically denies melena/BRBPR or nosebleeds.   LDH slightly higher at 255 after established baseline inpatient of 170 - 200. Denies tea-colored urine. No power elevations noted on interrogation. Had a few sub-therapeutic INRs in setting of bleeding.    Rexene Alberts, RN VAD Coordinator   Office: [phone] 24/7 Emergency VAD Pager: [phone]

## 2016-04-06 NOTE — Patient Instructions (Signed)
Back in 1 week for dressing change.

## 2016-04-07 NOTE — Progress Notes (Signed)
LVAD CLINIC NOTE  Patient ID: Robert [name], male   DOB: 1955-05-14, 61 y.o.   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 status post dual chamber Medtronic ICD implanted April 2011. Cath 2011 showed normal coronaries. He also has a history of VTach .   Amio recently stopped due to thyrotoxicity. Placed on prednisone and tapazole. Had VT off amio and mexilitene started.  Had [place] on 8/17 as part of dual listing process at [facility], [facility], [facility]. Normal filling pressures and cardiac output.   Drive Line Infeciton  08/26/15 >>Serratia marcescens (rare) >>Cipro 500 mg bid x 7 days  09/29/15 >> Serratia marcescens (abundant) >>Cipro 500 mg BID x14d 11/20/15 >>Serratia marcescens (few) Admitted IV ceftriaxone >>cipro 500 mg twice daily x 30 days 02/03/16 >> Serratia species >> Cipro 500 mg BID x 14d; referral to ID  03/12/2016 Surgical Debridement with VAC placement. Full thickness 5x8x6 cm. 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 Plan to continued ced on cefepime 1 gram IV every 8 hours  03/16/2016 PICC line placed for IV antibiotics.  03/17/2016 VAC reapplied with 1 gram of A Cell applied. 03/25/2016 VAC removed Damp saline dressing applied.  04/02/2016 Acell applied   Admitted to [facility] on 12/5 for heparin bridge and surgical debridement of persistent drive line infection. VAC applied with A cell. VAC maintained seal at 75 mm hg continuous therapy. Plan for ceftriaxone for 6 wks until 04/23/16. Will also need VAC changes weekly with ACell. [facility] following for home antibiotics.   He returns for LVAD follow up for drive line dressing change. Denies SOB/PND/Orthopnea. No bleeding problems.  No BRBPR. No fever or chills.  He has follow up with ID, Dr Johnnye Sima January 30th.  Followed by [facility] for home  antibiotics. No issues with PICC.   Recent RHC on 11/06/15 as part of Transplant process RA = 6 RV =  18/3/6 PA = 19/7 (12) PCW = 8 Fick cardiac output/index = 6.1/2.6 PVR = .0.7 WU Ao sat = 98% PA sat = 70%, 69%:   LVAD interrogation reveals:  Speed:  9600 Flow: 5.3 Power: 6.0 PI: 6.3  Alarms:  none Events: 5 - 10 PI events daily  Fixed speed:  9600 Low speed limit:  9000  11V battery status: Primary controller: replace 16 months                                   Secondary controller: replace 15 months    I reviewed the LVAD parameters from today, and compared the results to the patient's prior recorded data.  No programming changes were made.  The LVAD is functioning within specified parameters.  The patient performs LVAD self-test daily.  LVAD interrogation was negative for any significant power changes, alarms or PI events/speed drops.  LVAD equipment check completed and is in good working order.  Back-up equipment present.   LVAD education done on emergency procedures and precautions and reviewed exit site care.    Past Medical History:  Diagnosis Date  . AICD (automatic cardioverter/defibrillator) present 2011  . Bronchitis   . Congestive heart failure ([place]) 2011  . Hypertension 2000  . LVAD (left ventricular assist device) present ([place]) 2014  . Pneumonia   .  Pulmonary hypertension     Current Outpatient Prescriptions  Medication Sig Dispense Refill  . allopurinol (ZYLOPRIM) 300 MG tablet Take 1 tablet (300 mg total) by mouth daily. 30 tablet 6  . amLODipine (NORVASC) 2.5 MG tablet Take 1 tablet (2.5 mg total) by mouth daily. 30 tablet 6  . aspirin EC 325 MG tablet Take 1 tablet (325 mg total) by mouth daily. 30 tablet 6  . cefTRIAXone (ROCEPHIN) IVPB Inject 2 g into the vein every 12 (twelve) hours. Indication:  LVAD infection Last Day of Therapy:  04/23/16 Labs - Once weekly:  CBC/D and BMP, Labs - Every other week:  ESR and CRP 70 Units 0  . furosemide (LASIX)  40 MG tablet Take 1 tablet (40 mg total) by mouth daily as needed. Take as directed by clinic 30 tablet 3  . hydrALAZINE (APRESOLINE) 50 MG tablet Take 1 tablet (50 mg total) by mouth 2 (two) times daily. 60 tablet 6  . LOVENOX 100 MG/ML injection Inject 1 mL (100 mg total) into the skin every 12 (twelve) hours. 10 Syringe 1  . mexiletine (MEXITIL) 150 MG capsule Take 1 capsule (150 mg total) by mouth 2 (two) times daily. 60 capsule 5  . Multiple Vitamin (MULTIVITAMIN) tablet Take 1 tablet by mouth daily.    . pantoprazole (PROTONIX) 40 MG tablet Take 1 tablet (40 mg total) by mouth daily. 30 tablet 3  . sacubitril-valsartan (ENTRESTO) 97-103 MG Take 1 tablet by mouth 2 (two) times daily. 60 tablet 3  . traMADol (ULTRAM) 50 MG tablet Take 1 tablet (50 mg total) by mouth every 8 (eight) hours as needed for moderate pain. 30 tablet 5  . warfarin (COUMADIN) 5 MG tablet Take daily as directed by VAD clinic based on weekly INR results. 30 tablet 3   No current facility-administered medications for this encounter.     Metoprolol and Imdur [isosorbide nitrate]  REVIEW OF SYSTEMS: All systems negative except as listed in HPI, PMH and Problem list.  Doppler Pressure:100 Automatc BP: 105/85 (92) HR: 72 SPO2: 100 % Wt: Reported 242 pounds.  Ht:  6'2"  Physical Exam: GENERAL: Well appearing, male who presents to clinic today in no acute distress. HEENT: normal  NECK: Supple, JVP  5-6;  2+ bilaterally, no bruits.  No lymphadenopathy or thyromegaly appreciated.  CARDIAC:  Mechanical heart sounds with LVAD hum present.  LUNGS:  Clear to auscultation bilaterally.  ABDOMEN:  Soft, round, nontender, positive bowel sounds x4.     LVAD exit site: Full thickness wound around driveline. !00% granulation. Driiveline itself with multiple twists EXTREMITIES:  Warm and dry, no cyanosis, clubbing, rash. RUE PICC double lumen.   NEUROLOGIC:  Alert and oriented x 4.  Gait steady.  No aphasia.  No dysarthria.   Affect pleasant.     Recent Results (from the past 2160 hour(s))  Basic metabolic panel     Status: Abnormal   Collection Time: 01/08/16  9:30 AM  Result Value Ref Range   Sodium 138 135 - 145 mmol/L   Potassium 3.4 (L) 3.5 - 5.1 mmol/L   Chloride 105 101 - 111 mmol/L   CO2 26 22 - 32 mmol/L   Glucose, Bld 90 65 - 99 mg/dL   BUN 7 6 - 20 mg/dL   Creatinine, Ser 1.09 0.61 - 1.24 mg/dL   Calcium 9.0 8.9 - 10.3 mg/dL   GFR calc non Af Amer >60 >60 mL/min   GFR calc Af Amer >60 >60 mL/min  Comment: (NOTE) The eGFR has been calculated using the CKD EPI equation. This calculation has not been validated in all clinical situations. eGFR's persistently <60 mL/min signify possible Chronic Kidney Disease.    Anion gap 7 5 - 15  CBC     Status: None   Collection Time: 01/08/16  9:30 AM  Result Value Ref Range   WBC 6.7 4.0 - 10.5 K/uL   RBC 5.11 4.22 - 5.81 MIL/uL   Hemoglobin 14.6 13.0 - 17.0 g/dL   HCT 44.3 39.0 - 52.0 %   MCV 86.7 78.0 - 100.0 fL   MCH 28.6 26.0 - 34.0 pg   MCHC 33.0 30.0 - 36.0 g/dL   RDW 14.6 11.5 - 15.5 %   Platelets 174 150 - 400 K/uL  Protime-INR     Status: Abnormal   Collection Time: 01/08/16  9:30 AM  Result Value Ref Range   Prothrombin Time 27.3 (H) 11.4 - 15.2 seconds   INR 2.48   Lactate dehydrogenase     Status: Abnormal   Collection Time: 01/08/16  9:30 AM  Result Value Ref Range   LDH 278 (H) 98 - 192 U/L  Protime-INR     Status: Abnormal   Collection Time: 01/28/16 10:18 AM  Result Value Ref Range   Prothrombin Time 27.1 (H) 11.4 - 15.2 seconds   INR 3.64   Basic metabolic panel     Status: None   Collection Time: 02/03/16 12:43 PM  Result Value Ref Range   Sodium 139 135 - 145 mmol/L   Potassium 4.0 3.5 - 5.1 mmol/L   Chloride 106 101 - 111 mmol/L   CO2 26 22 - 32 mmol/L   Glucose, Bld 90 65 - 99 mg/dL   BUN 12 6 - 20 mg/dL   Creatinine, Ser 1.10 0.61 - 1.24 mg/dL   Calcium 9.2 8.9 - 10.3 mg/dL   GFR calc non Af Amer >60 >60  mL/min   GFR calc Af Amer >60 >60 mL/min    Comment: (NOTE) The eGFR has been calculated using the CKD EPI equation. This calculation has not been validated in all clinical situations. eGFR's persistently <60 mL/min signify possible Chronic Kidney Disease.    Anion gap 7 5 - 15  CBC     Status: None   Collection Time: 02/03/16 12:43 PM  Result Value Ref Range   WBC 7.0 4.0 - 10.5 K/uL   RBC 5.03 4.22 - 5.81 MIL/uL   Hemoglobin 14.3 13.0 - 17.0 g/dL   HCT 43.5 39.0 - 52.0 %   MCV 86.5 78.0 - 100.0 fL   MCH 28.4 26.0 - 34.0 pg   MCHC 32.9 30.0 - 36.0 g/dL   RDW 15.2 11.5 - 15.5 %   Platelets 172 150 - 400 K/uL  B Nat Peptide     Status: None   Collection Time: 02/03/16 12:43 PM  Result Value Ref Range   B Natriuretic Peptide 66.1 0.0 - 100.0 pg/mL  INR/PT     Status: Abnormal   Collection Time: 02/03/16 12:43 PM  Result Value Ref Range   Prothrombin Time 24.5 (H) 11.4 - 15.2 seconds   INR 2.17   Aerobic Culture (superficial specimen)     Status: None   Collection Time: 02/03/16  3:32 PM  Result Value Ref Range   Specimen Description WOUND ABDOMEN    Special Requests DRIVE LINE SITE    Gram Stain      FEW WBC PRESENT, PREDOMINANTLY PMN NO ORGANISMS  SEEN    Culture RARE SERRATIA SPECIES    Report Status 02/06/2016 FINAL    Organism ID, Bacteria SERRATIA SPECIES       Susceptibility   Serratia species - MIC*    CEFAZOLIN >=64 RESISTANT Resistant     CEFEPIME <=1 SENSITIVE Sensitive     CEFTAZIDIME <=1 SENSITIVE Sensitive     CEFTRIAXONE <=1 SENSITIVE Sensitive     CIPROFLOXACIN 1 SENSITIVE Sensitive     GENTAMICIN <=1 SENSITIVE Sensitive     TRIMETH/SULFA 40 SENSITIVE Sensitive     * RARE SERRATIA SPECIES  TSH     Status: None   Collection Time: 02/05/16  3:36 PM  Result Value Ref Range   TSH 1.422 0.350 - 4.500 uIU/mL    Comment: Performed by a 3rd Generation assay with a functional sensitivity of <=0.01 uIU/mL.  T4, free     Status: None   Collection Time:  02/05/16  3:36 PM  Result Value Ref Range   Free T4 0.95 0.61 - 1.12 ng/dL    Comment: (NOTE) Biotin ingestion may interfere with free T4 tests. If the results are inconsistent with the TSH level, previous test results, or the clinical presentation, then consider biotin interference. If needed, order repeat testing after stopping biotin.   T3, free     Status: None   Collection Time: 02/05/16  3:36 PM  Result Value Ref Range   T3, Free 2.6 2.0 - 4.4 pg/mL    Comment: (NOTE) Performed At: [facility] [place], [place] [id number] Lindon Romp MD GY:[id number]   Lactate dehydrogenase     Status: Abnormal   Collection Time: 02/13/16 10:49 AM  Result Value Ref Range   LDH 209 (H) 98 - 192 U/L  Protime-INR     Status: Abnormal   Collection Time: 02/13/16 10:49 AM  Result Value Ref Range   Prothrombin Time 20.2 (H) 11.4 - 15.2 seconds   INR 1.70   CBC with Differential     Status: Abnormal   Collection Time: 02/13/16 10:49 AM  Result Value Ref Range   WBC 6.5 4.0 - 10.5 K/uL   RBC 4.70 4.22 - 5.81 MIL/uL   Hemoglobin 13.2 13.0 - 17.0 g/dL   HCT 40.5 39.0 - 52.0 %   MCV 86.2 78.0 - 100.0 fL   MCH 28.1 26.0 - 34.0 pg   MCHC 32.6 30.0 - 36.0 g/dL   RDW 15.0 11.5 - 15.5 %   Platelets 133 (L) 150 - 400 K/uL   Neutrophils Relative % 67 %   Neutro Abs 4.3 1.7 - 7.7 K/uL   Lymphocytes Relative 24 %   Lymphs Abs 1.6 0.7 - 4.0 K/uL   Monocytes Relative 8 %   Monocytes Absolute 0.5 0.1 - 1.0 K/uL   Eosinophils Relative 1 %   Eosinophils Absolute 0.1 0.0 - 0.7 K/uL   Basophils Relative 0 %   Basophils Absolute 0.0 0.0 - 0.1 K/uL  INR/PT     Status: Abnormal   Collection Time: 02/20/16 10:22 AM  Result Value Ref Range   Prothrombin Time 29.2 (H) 11.4 - 15.2 seconds   INR 2.69   CULTURE, ROUTINE-ABSCESS     Status: None   Collection Time: 02/25/16  3:30 PM  Result Value Ref Range   Culture Moderate SERRATIA MARCESCENS    Gram Stain Moderate    Gram  Stain WBC present-both PMN and Mononuclear    Gram Stain No Squamous Epithelial Cells Seen  Gram Stain No Organisms Seen    Organism ID, Bacteria SERRATIA MARCESCENS       Susceptibility   Serratia marcescens -  (no method available)    AMOX/CLAVULANIC  Resistant     CEFAZOLIN >=64 Resistant     CEFTRIAXONE <=1 Sensitive     CEFTAZIDIME <=1 Sensitive     CEFEPIME <=1 Sensitive     GENTAMICIN <=1 Sensitive     TOBRAMYCIN <=1 Sensitive     CIPROFLOXACIN 2 Intermediate     LEVOFLOXACIN 4 Intermediate     TRIMETH/SULFA* <=20 Sensitive      * NR=NOT REPORTABLE,SEE COMMENTORAL therapy:A cefazolin MIC of <32 predicts susceptibility to the oral agents cefaclor,cefdinir,cefpodoxime,cefprozil,cefuroxime,cephalexin,and loracarbef when used for therapy of uncomplicated UTIs due to E.coli,K.pneumomiae,and P.mirabilis. PARENTERAL therapy: A cefazolinMIC of >8 indicates resistance to parenteralcefazolin. An alternate test method must beperformed to confirm susceptibility to parenteralcefazolin.  INR/PT     Status: Abnormal   Collection Time: 02/25/16  3:53 PM  Result Value Ref Range   Prothrombin Time 28.6 (H) 11.4 - 15.2 seconds   INR 2.62   Protime-INR     Status: Abnormal   Collection Time: 03/09/16  6:11 PM  Result Value Ref Range   Prothrombin Time 21.1 (H) 11.4 - 15.2 seconds   INR 1.80   Lactate dehydrogenase     Status: Abnormal   Collection Time: 03/09/16  6:11 PM  Result Value Ref Range   LDH 224 (H) 98 - 192 U/L  MRSA PCR Screening     Status: None   Collection Time: 03/09/16  6:23 PM  Result Value Ref Range   MRSA by PCR NEGATIVE NEGATIVE    Comment:        The GeneXpert MRSA Assay (FDA approved for NASAL specimens only), is one component of a comprehensive MRSA colonization surveillance program. It is not intended to diagnose MRSA infection nor to guide or monitor treatment for MRSA infections.   Heparin level (unfractionated)     Status: None   Collection Time:  03/10/16  4:32 AM  Result Value Ref Range   Heparin Unfractionated 0.48 0.30 - 0.70 IU/mL    Comment:        IF HEPARIN RESULTS ARE BELOW EXPECTED VALUES, AND PATIENT DOSAGE HAS BEEN CONFIRMED, SUGGEST FOLLOW UP TESTING OF ANTITHROMBIN III LEVELS.   Basic metabolic panel     Status: Abnormal   Collection Time: 03/10/16  4:32 AM  Result Value Ref Range   Sodium 138 135 - 145 mmol/L   Potassium 3.6 3.5 - 5.1 mmol/L   Chloride 105 101 - 111 mmol/L   CO2 27 22 - 32 mmol/L   Glucose, Bld 88 65 - 99 mg/dL   BUN 9 6 - 20 mg/dL   Creatinine, Ser 1.00 0.61 - 1.24 mg/dL   Calcium 8.5 (L) 8.9 - 10.3 mg/dL   GFR calc non Af Amer >60 >60 mL/min   GFR calc Af Amer >60 >60 mL/min    Comment: (NOTE) The eGFR has been calculated using the CKD EPI equation. This calculation has not been validated in all clinical situations. eGFR's persistently <60 mL/min signify possible Chronic Kidney Disease.    Anion gap 6 5 - 15  CBC     Status: Abnormal   Collection Time: 03/10/16  4:32 AM  Result Value Ref Range   WBC 6.4 4.0 - 10.5 K/uL   RBC 4.60 4.22 - 5.81 MIL/uL   Hemoglobin 13.1 13.0 - 17.0 g/dL   HCT  39.1 39.0 - 52.0 %   MCV 85.0 78.0 - 100.0 fL   MCH 28.5 26.0 - 34.0 pg   MCHC 33.5 30.0 - 36.0 g/dL   RDW 15.0 11.5 - 15.5 %   Platelets 146 (L) 150 - 400 K/uL  Lactate dehydrogenase     Status: Abnormal   Collection Time: 03/10/16  4:32 AM  Result Value Ref Range   LDH 194 (H) 98 - 192 U/L  Protime-INR     Status: Abnormal   Collection Time: 03/10/16  4:32 AM  Result Value Ref Range   Prothrombin Time 19.6 (H) 11.4 - 15.2 seconds   INR 1.63   Heparin level (unfractionated)     Status: Abnormal   Collection Time: 03/11/16  3:23 AM  Result Value Ref Range   Heparin Unfractionated 0.85 (H) 0.30 - 0.70 IU/mL    Comment:        IF HEPARIN RESULTS ARE BELOW EXPECTED VALUES, AND PATIENT DOSAGE HAS BEEN CONFIRMED, SUGGEST FOLLOW UP TESTING OF ANTITHROMBIN III LEVELS.   CBC     Status:  None   Collection Time: 03/11/16  3:23 AM  Result Value Ref Range   WBC 7.9 4.0 - 10.5 K/uL   RBC 5.03 4.22 - 5.81 MIL/uL   Hemoglobin 14.2 13.0 - 17.0 g/dL   HCT 42.6 39.0 - 52.0 %   MCV 84.7 78.0 - 100.0 fL   MCH 28.2 26.0 - 34.0 pg   MCHC 33.3 30.0 - 36.0 g/dL   RDW 15.1 11.5 - 15.5 %   Platelets 180 150 - 400 K/uL  Lactate dehydrogenase     Status: Abnormal   Collection Time: 03/11/16  3:23 AM  Result Value Ref Range   LDH 217 (H) 98 - 192 U/L  Protime-INR     Status: Abnormal   Collection Time: 03/11/16  3:23 AM  Result Value Ref Range   Prothrombin Time 16.3 (H) 11.4 - 15.2 seconds   INR 1.30   Heparin level (unfractionated)     Status: None   Collection Time: 03/11/16 10:56 AM  Result Value Ref Range   Heparin Unfractionated 0.62 0.30 - 0.70 IU/mL    Comment:        IF HEPARIN RESULTS ARE BELOW EXPECTED VALUES, AND PATIENT DOSAGE HAS BEEN CONFIRMED, SUGGEST FOLLOW UP TESTING OF ANTITHROMBIN III LEVELS.   Type and screen [place]     Status: None   Collection Time: 03/11/16  8:58 PM  Result Value Ref Range   ABO/RH(D) O POS    Antibody Screen NEG    Sample Expiration 03/14/2016   Surgical PCR screen     Status: None   Collection Time: 03/11/16 11:40 PM  Result Value Ref Range   MRSA, PCR NEGATIVE NEGATIVE   Staphylococcus aureus NEGATIVE NEGATIVE    Comment:        The Xpert SA Assay (FDA approved for NASAL specimens in patients over 72 years of age), is one component of a comprehensive surveillance program.  Test performance has been validated by [facility] for patients greater than or equal to 3 year old. It is not intended to diagnose infection nor to guide or monitor treatment.   Lactate dehydrogenase     Status: None   Collection Time: 03/12/16  2:13 AM  Result Value Ref Range   LDH 176 98 - 192 U/L  Protime-INR     Status: Abnormal   Collection Time: 03/12/16  2:13 AM  Result Value  Ref Range   Prothrombin Time 15.5 (H)  11.4 - 15.2 seconds   INR 1.22   CBC     Status: None   Collection Time: 03/12/16  2:13 AM  Result Value Ref Range   WBC 7.5 4.0 - 10.5 K/uL   RBC 4.71 4.22 - 5.81 MIL/uL   Hemoglobin 13.4 13.0 - 17.0 g/dL   HCT 40.2 39.0 - 52.0 %   MCV 85.4 78.0 - 100.0 fL   MCH 28.5 26.0 - 34.0 pg   MCHC 33.3 30.0 - 36.0 g/dL   RDW 15.1 11.5 - 15.5 %   Platelets 161 150 - 400 K/uL  APTT     Status: Abnormal   Collection Time: 03/12/16  2:13 AM  Result Value Ref Range   aPTT 140 (H) 24 - 36 seconds    Comment:        IF BASELINE aPTT IS ELEVATED, SUGGEST PATIENT RISK ASSESSMENT BE USED TO DETERMINE APPROPRIATE ANTICOAGULANT THERAPY.   Blood gas, arterial     Status: None   Collection Time: 03/12/16  3:26 AM  Result Value Ref Range   pH, Arterial 7.404 7.350 - 7.450   pCO2 arterial 40.7 32.0 - 48.0 mmHg   pO2, Arterial 84.2 83.0 - 108.0 mmHg   Bicarbonate 24.9 20.0 - 28.0 mmol/L   Acid-Base Excess 0.7 0.0 - 2.0 mmol/L   O2 Saturation 95.8 %   Patient temperature 98.6    Collection site RIGHT RADIAL    Drawn by [phone]    Sample type ARTERIAL DRAW    Allens test (pass/fail) PASS PASS  Urinalysis, Routine w reflex microscopic     Status: None   Collection Time: 03/12/16  5:21 AM  Result Value Ref Range   Color, Urine YELLOW YELLOW   APPearance CLEAR CLEAR   Specific Gravity, Urine 1.011 1.005 - 1.030   pH 6.0 5.0 - 8.0   Glucose, UA NEGATIVE NEGATIVE mg/dL   Hgb urine dipstick NEGATIVE NEGATIVE   Bilirubin Urine NEGATIVE NEGATIVE   Ketones, ur NEGATIVE NEGATIVE mg/dL   Protein, ur NEGATIVE NEGATIVE mg/dL   Nitrite NEGATIVE NEGATIVE   Leukocytes, UA NEGATIVE NEGATIVE  Anaerobic culture     Status: None   Collection Time: 03/12/16  8:18 AM  Result Value Ref Range   Specimen Description WOUND ABDOMEN    Special Requests SPEC A    Culture NO ANAEROBES ISOLATED    Report Status 03/17/2016 FINAL   Aerobic Culture (superficial specimen)     Status: None   Collection Time: 03/12/16   8:18 AM  Result Value Ref Range   Specimen Description WOUND ABDOMEN    Special Requests SPEC A    Gram Stain      ABUNDANT WBC PRESENT, PREDOMINANTLY PMN NO ORGANISMS SEEN    Culture      FEW SERRATIA MARCESCENS CRITICAL RESULT CALLED TO, READ BACK BY AND VERIFIED WITH: RN K.JOHNSON [id number] MLM    Report Status 03/14/2016 FINAL    Organism ID, Bacteria SERRATIA MARCESCENS       Susceptibility   Serratia marcescens - MIC*    CEFAZOLIN >=64 RESISTANT Resistant     CEFEPIME <=1 SENSITIVE Sensitive     CEFTAZIDIME <=1 SENSITIVE Sensitive     CEFTRIAXONE <=1 SENSITIVE Sensitive     CIPROFLOXACIN 2 INTERMEDIATE Intermediate     GENTAMICIN <=1 SENSITIVE Sensitive     TRIMETH/SULFA <=20 SENSITIVE Sensitive     * FEW SERRATIA MARCESCENS  Aerobic/Anaerobic Culture (surgical/deep wound)  Status: None   Collection Time: 03/12/16  8:24 AM  Result Value Ref Range   Specimen Description TISSUE ABDOMEN    Special Requests SPEC B    Gram Stain      FEW WBC PRESENT,BOTH PMN AND MONONUCLEAR NO ORGANISMS SEEN    Culture      FEW SERRATIA MARCESCENS CRITICAL RESULT CALLED TO, READ BACK BY AND VERIFIED WITH: RN K.JOHNSON [id number] MLM NO ANAEROBES ISOLATED    Report Status 03/17/2016 FINAL    Organism ID, Bacteria SERRATIA MARCESCENS       Susceptibility   Serratia marcescens - MIC*    CEFAZOLIN >=64 RESISTANT Resistant     CEFEPIME <=1 SENSITIVE Sensitive     CEFTAZIDIME <=1 SENSITIVE Sensitive     CEFTRIAXONE <=1 SENSITIVE Sensitive     CIPROFLOXACIN 2 INTERMEDIATE Intermediate     GENTAMICIN <=1 SENSITIVE Sensitive     TRIMETH/SULFA <=20 SENSITIVE Sensitive     * FEW SERRATIA MARCESCENS  Heparin level (unfractionated)     Status: None   Collection Time: 03/12/16  9:34 PM  Result Value Ref Range   Heparin Unfractionated 0.30 0.30 - 0.70 IU/mL    Comment:        IF HEPARIN RESULTS ARE BELOW EXPECTED VALUES, AND PATIENT DOSAGE HAS BEEN CONFIRMED, SUGGEST FOLLOW UP  TESTING OF ANTITHROMBIN III LEVELS.   Lactate dehydrogenase     Status: None   Collection Time: 03/13/16  1:35 AM  Result Value Ref Range   LDH 179 98 - 192 U/L  Protime-INR     Status: Abnormal   Collection Time: 03/13/16  1:35 AM  Result Value Ref Range   Prothrombin Time 15.4 (H) 11.4 - 15.2 seconds   INR 1.22   CBC     Status: Abnormal   Collection Time: 03/13/16  1:35 AM  Result Value Ref Range   WBC 8.4 4.0 - 10.5 K/uL   RBC 4.70 4.22 - 5.81 MIL/uL   Hemoglobin 13.3 13.0 - 17.0 g/dL   HCT 40.0 39.0 - 52.0 %   MCV 85.1 78.0 - 100.0 fL   MCH 28.3 26.0 - 34.0 pg   MCHC 33.3 30.0 - 36.0 g/dL   RDW 15.4 11.5 - 15.5 %   Platelets 148 (L) 150 - 400 K/uL  Basic metabolic panel     Status: Abnormal   Collection Time: 03/13/16  1:35 AM  Result Value Ref Range   Sodium 134 (L) 135 - 145 mmol/L   Potassium 3.7 3.5 - 5.1 mmol/L   Chloride 101 101 - 111 mmol/L   CO2 26 22 - 32 mmol/L   Glucose, Bld 112 (H) 65 - 99 mg/dL   BUN 9 6 - 20 mg/dL   Creatinine, Ser 1.13 0.61 - 1.24 mg/dL   Calcium 8.6 (L) 8.9 - 10.3 mg/dL   GFR calc non Af Amer >60 >60 mL/min   GFR calc Af Amer >60 >60 mL/min    Comment: (NOTE) The eGFR has been calculated using the CKD EPI equation. This calculation has not been validated in all clinical situations. eGFR's persistently <60 mL/min signify possible Chronic Kidney Disease.    Anion gap 7 5 - 15  Heparin level (unfractionated)     Status: Abnormal   Collection Time: 03/13/16  8:04 AM  Result Value Ref Range   Heparin Unfractionated 0.25 (L) 0.30 - 0.70 IU/mL    Comment:        IF HEPARIN RESULTS ARE BELOW EXPECTED VALUES, AND  PATIENT DOSAGE HAS BEEN CONFIRMED, SUGGEST FOLLOW UP TESTING OF ANTITHROMBIN III LEVELS.   Heparin level (unfractionated)     Status: Abnormal   Collection Time: 03/13/16 10:36 PM  Result Value Ref Range   Heparin Unfractionated 0.20 (L) 0.30 - 0.70 IU/mL    Comment:        IF HEPARIN RESULTS ARE BELOW EXPECTED VALUES,  AND PATIENT DOSAGE HAS BEEN CONFIRMED, SUGGEST FOLLOW UP TESTING OF ANTITHROMBIN III LEVELS.   Lactate dehydrogenase     Status: None   Collection Time: 03/14/16  2:02 AM  Result Value Ref Range   LDH 192 98 - 192 U/L  Protime-INR     Status: Abnormal   Collection Time: 03/14/16  2:02 AM  Result Value Ref Range   Prothrombin Time 15.4 (H) 11.4 - 15.2 seconds   INR 1.21   CBC     Status: Abnormal   Collection Time: 03/14/16  2:02 AM  Result Value Ref Range   WBC 8.9 4.0 - 10.5 K/uL   RBC 4.63 4.22 - 5.81 MIL/uL   Hemoglobin 13.1 13.0 - 17.0 g/dL   HCT 40.0 39.0 - 52.0 %   MCV 86.4 78.0 - 100.0 fL   MCH 28.3 26.0 - 34.0 pg   MCHC 32.8 30.0 - 36.0 g/dL   RDW 15.4 11.5 - 15.5 %   Platelets 149 (L) 150 - 400 K/uL  Comprehensive metabolic panel     Status: Abnormal   Collection Time: 03/14/16  2:02 AM  Result Value Ref Range   Sodium 135 135 - 145 mmol/L   Potassium 3.9 3.5 - 5.1 mmol/L   Chloride 102 101 - 111 mmol/L   CO2 22 22 - 32 mmol/L   Glucose, Bld 95 65 - 99 mg/dL   BUN 12 6 - 20 mg/dL   Creatinine, Ser 1.21 0.61 - 1.24 mg/dL   Calcium 8.8 (L) 8.9 - 10.3 mg/dL   Total Protein 6.5 6.5 - 8.1 g/dL   Albumin 3.6 3.5 - 5.0 g/dL   AST 15 15 - 41 U/L   ALT 13 (L) 17 - 63 U/L   Alkaline Phosphatase 79 38 - 126 U/L   Total Bilirubin 0.6 0.3 - 1.2 mg/dL   GFR calc non Af Amer >60 >60 mL/min   GFR calc Af Amer >60 >60 mL/min    Comment: (NOTE) The eGFR has been calculated using the CKD EPI equation. This calculation has not been validated in all clinical situations. eGFR's persistently <60 mL/min signify possible Chronic Kidney Disease.    Anion gap 11 5 - 15  Heparin level (unfractionated)     Status: Abnormal   Collection Time: 03/14/16  2:02 AM  Result Value Ref Range   Heparin Unfractionated 0.17 (L) 0.30 - 0.70 IU/mL    Comment:        IF HEPARIN RESULTS ARE BELOW EXPECTED VALUES, AND PATIENT DOSAGE HAS BEEN CONFIRMED, SUGGEST FOLLOW UP TESTING OF  ANTITHROMBIN III LEVELS.   Lactate dehydrogenase     Status: None   Collection Time: 03/15/16  3:53 AM  Result Value Ref Range   LDH 174 98 - 192 U/L  CBC     Status: Abnormal   Collection Time: 03/15/16  3:53 AM  Result Value Ref Range   WBC 7.9 4.0 - 10.5 K/uL   RBC 4.46 4.22 - 5.81 MIL/uL   Hemoglobin 12.8 (L) 13.0 - 17.0 g/dL   HCT 38.5 (L) 39.0 - 52.0 %   MCV 86.3  78.0 - 100.0 fL   MCH 28.7 26.0 - 34.0 pg   MCHC 33.2 30.0 - 36.0 g/dL   RDW 15.6 (H) 11.5 - 15.5 %   Platelets 145 (L) 150 - 400 K/uL  Basic metabolic panel     Status: Abnormal   Collection Time: 03/15/16  3:53 AM  Result Value Ref Range   Sodium 136 135 - 145 mmol/L   Potassium 4.0 3.5 - 5.1 mmol/L   Chloride 105 101 - 111 mmol/L   CO2 24 22 - 32 mmol/L   Glucose, Bld 91 65 - 99 mg/dL   BUN 10 6 - 20 mg/dL   Creatinine, Ser 1.02 0.61 - 1.24 mg/dL   Calcium 8.8 (L) 8.9 - 10.3 mg/dL   GFR calc non Af Amer >60 >60 mL/min   GFR calc Af Amer >60 >60 mL/min    Comment: (NOTE) The eGFR has been calculated using the CKD EPI equation. This calculation has not been validated in all clinical situations. eGFR's persistently <60 mL/min signify possible Chronic Kidney Disease.    Anion gap 7 5 - 15  Heparin level (unfractionated)     Status: Abnormal   Collection Time: 03/15/16  4:00 AM  Result Value Ref Range   Heparin Unfractionated 0.13 (L) 0.30 - 0.70 IU/mL    Comment:        IF HEPARIN RESULTS ARE BELOW EXPECTED VALUES, AND PATIENT DOSAGE HAS BEEN CONFIRMED, SUGGEST FOLLOW UP TESTING OF ANTITHROMBIN III LEVELS.   Protime-INR     Status: Abnormal   Collection Time: 03/15/16  4:00 AM  Result Value Ref Range   Prothrombin Time 15.4 (H) 11.4 - 15.2 seconds   INR 1.22   Lactate dehydrogenase     Status: None   Collection Time: 03/16/16  2:09 AM  Result Value Ref Range   LDH 179 98 - 192 U/L  CBC     Status: Abnormal   Collection Time: 03/16/16  2:09 AM  Result Value Ref Range   WBC 7.9 4.0 - 10.5 K/uL     RBC 4.37 4.22 - 5.81 MIL/uL   Hemoglobin 12.4 (L) 13.0 - 17.0 g/dL   HCT 37.8 (L) 39.0 - 52.0 %   MCV 86.5 78.0 - 100.0 fL   MCH 28.4 26.0 - 34.0 pg   MCHC 32.8 30.0 - 36.0 g/dL   RDW 15.8 (H) 11.5 - 15.5 %   Platelets 137 (L) 150 - 400 K/uL  Basic metabolic panel     Status: Abnormal   Collection Time: 03/16/16  2:09 AM  Result Value Ref Range   Sodium 137 135 - 145 mmol/L   Potassium 4.3 3.5 - 5.1 mmol/L   Chloride 105 101 - 111 mmol/L   CO2 25 22 - 32 mmol/L   Glucose, Bld 88 65 - 99 mg/dL   BUN 10 6 - 20 mg/dL   Creatinine, Ser 1.03 0.61 - 1.24 mg/dL   Calcium 8.8 (L) 8.9 - 10.3 mg/dL   GFR calc non Af Amer >60 >60 mL/min   GFR calc Af Amer >60 >60 mL/min    Comment: (NOTE) The eGFR has been calculated using the CKD EPI equation. This calculation has not been validated in all clinical situations. eGFR's persistently <60 mL/min signify possible Chronic Kidney Disease.    Anion gap 7 5 - 15  Heparin level (unfractionated)     Status: Abnormal   Collection Time: 03/16/16  2:09 AM  Result Value Ref Range   Heparin Unfractionated <  0.10 (L) 0.30 - 0.70 IU/mL    Comment:        IF HEPARIN RESULTS ARE BELOW EXPECTED VALUES, AND PATIENT DOSAGE HAS BEEN CONFIRMED, SUGGEST FOLLOW UP TESTING OF ANTITHROMBIN III LEVELS.   Protime-INR     Status: Abnormal   Collection Time: 03/16/16  2:09 AM  Result Value Ref Range   Prothrombin Time 16.2 (H) 11.4 - 15.2 seconds   INR 1.29   Lactate dehydrogenase     Status: None   Collection Time: 03/17/16  4:20 AM  Result Value Ref Range   LDH 168 98 - 192 U/L  Protime-INR     Status: Abnormal   Collection Time: 03/17/16  4:20 AM  Result Value Ref Range   Prothrombin Time 16.9 (H) 11.4 - 15.2 seconds   INR 1.36   CBC     Status: Abnormal   Collection Time: 03/17/16  4:20 AM  Result Value Ref Range   WBC 8.1 4.0 - 10.5 K/uL   RBC 4.55 4.22 - 5.81 MIL/uL   Hemoglobin 12.7 (L) 13.0 - 17.0 g/dL   HCT 39.7 39.0 - 52.0 %   MCV 87.3  78.0 - 100.0 fL   MCH 27.9 26.0 - 34.0 pg   MCHC 32.0 30.0 - 36.0 g/dL   RDW 15.8 (H) 11.5 - 15.5 %   Platelets 138 (L) 150 - 400 K/uL  Basic metabolic panel     Status: None   Collection Time: 03/17/16  4:20 AM  Result Value Ref Range   Sodium 138 135 - 145 mmol/L   Potassium 4.0 3.5 - 5.1 mmol/L   Chloride 105 101 - 111 mmol/L   CO2 25 22 - 32 mmol/L   Glucose, Bld 93 65 - 99 mg/dL   BUN 12 6 - 20 mg/dL   Creatinine, Ser 0.95 0.61 - 1.24 mg/dL   Calcium 8.9 8.9 - 10.3 mg/dL   GFR calc non Af Amer >60 >60 mL/min   GFR calc Af Amer >60 >60 mL/min    Comment: (NOTE) The eGFR has been calculated using the CKD EPI equation. This calculation has not been validated in all clinical situations. eGFR's persistently <60 mL/min signify possible Chronic Kidney Disease.    Anion gap 8 5 - 15  Heparin level (unfractionated)     Status: Abnormal   Collection Time: 03/17/16  4:20 AM  Result Value Ref Range   Heparin Unfractionated 0.17 (L) 0.30 - 0.70 IU/mL    Comment:        IF HEPARIN RESULTS ARE BELOW EXPECTED VALUES, AND PATIENT DOSAGE HAS BEEN CONFIRMED, SUGGEST FOLLOW UP TESTING OF ANTITHROMBIN III LEVELS.   Heparin level (unfractionated)     Status: Abnormal   Collection Time: 03/18/16  3:57 AM  Result Value Ref Range   Heparin Unfractionated 0.22 (L) 0.30 - 0.70 IU/mL    Comment:        IF HEPARIN RESULTS ARE BELOW EXPECTED VALUES, AND PATIENT DOSAGE HAS BEEN CONFIRMED, SUGGEST FOLLOW UP TESTING OF ANTITHROMBIN III LEVELS.   CBC     Status: Abnormal   Collection Time: 03/18/16  3:57 AM  Result Value Ref Range   WBC 7.3 4.0 - 10.5 K/uL   RBC 4.58 4.22 - 5.81 MIL/uL   Hemoglobin 13.1 13.0 - 17.0 g/dL   HCT 39.5 39.0 - 52.0 %   MCV 86.2 78.0 - 100.0 fL   MCH 28.6 26.0 - 34.0 pg   MCHC 33.2 30.0 - 36.0 g/dL  RDW 15.8 (H) 11.5 - 15.5 %   Platelets 135 (L) 150 - 400 K/uL  Lactate dehydrogenase     Status: None   Collection Time: 03/18/16  3:57 AM  Result Value Ref  Range   LDH 171 98 - 192 U/L  Protime-INR     Status: Abnormal   Collection Time: 03/18/16  3:57 AM  Result Value Ref Range   Prothrombin Time 18.0 (H) 11.4 - 15.2 seconds   INR 1.47   Lactate dehydrogenase     Status: None   Collection Time: 03/19/16  5:00 AM  Result Value Ref Range   LDH 179 98 - 192 U/L  Protime-INR     Status: Abnormal   Collection Time: 03/19/16  5:00 AM  Result Value Ref Range   Prothrombin Time 19.8 (H) 11.4 - 15.2 seconds   INR 1.66   CBC     Status: Abnormal   Collection Time: 03/19/16  5:00 AM  Result Value Ref Range   WBC 8.3 4.0 - 10.5 K/uL   RBC 4.71 4.22 - 5.81 MIL/uL   Hemoglobin 13.2 13.0 - 17.0 g/dL   HCT 41.1 39.0 - 52.0 %   MCV 87.3 78.0 - 100.0 fL   MCH 28.0 26.0 - 34.0 pg   MCHC 32.1 30.0 - 36.0 g/dL   RDW 15.9 (H) 11.5 - 15.5 %   Platelets 147 (L) 150 - 400 K/uL  Heparin level (unfractionated)     Status: Abnormal   Collection Time: 03/19/16  5:00 AM  Result Value Ref Range   Heparin Unfractionated 0.22 (L) 0.30 - 0.70 IU/mL    Comment:        IF HEPARIN RESULTS ARE BELOW EXPECTED VALUES, AND PATIENT DOSAGE HAS BEEN CONFIRMED, SUGGEST FOLLOW UP TESTING OF ANTITHROMBIN III LEVELS.   Lactate dehydrogenase     Status: Abnormal   Collection Time: 03/20/16  4:13 AM  Result Value Ref Range   LDH 195 (H) 98 - 192 U/L  Protime-INR     Status: Abnormal   Collection Time: 03/20/16  4:13 AM  Result Value Ref Range   Prothrombin Time 22.0 (H) 11.4 - 15.2 seconds   INR 1.90   CBC     Status: Abnormal   Collection Time: 03/20/16  4:13 AM  Result Value Ref Range   WBC 7.7 4.0 - 10.5 K/uL   RBC 4.82 4.22 - 5.81 MIL/uL   Hemoglobin 13.5 13.0 - 17.0 g/dL   HCT 41.4 39.0 - 52.0 %   MCV 85.9 78.0 - 100.0 fL   MCH 28.0 26.0 - 34.0 pg   MCHC 32.6 30.0 - 36.0 g/dL   RDW 15.7 (H) 11.5 - 15.5 %   Platelets 161 150 - 400 K/uL  Heparin level (unfractionated)     Status: Abnormal   Collection Time: 03/20/16  4:13 AM  Result Value Ref Range    Heparin Unfractionated 0.25 (L) 0.30 - 0.70 IU/mL    Comment:        IF HEPARIN RESULTS ARE BELOW EXPECTED VALUES, AND PATIENT DOSAGE HAS BEEN CONFIRMED, SUGGEST FOLLOW UP TESTING OF ANTITHROMBIN III LEVELS.   Basic metabolic panel     Status: Abnormal   Collection Time: 03/20/16  4:13 AM  Result Value Ref Range   Sodium 136 135 - 145 mmol/L   Potassium 4.2 3.5 - 5.1 mmol/L   Chloride 103 101 - 111 mmol/L   CO2 20 (L) 22 - 32 mmol/L   Glucose, Bld 85 65 -  99 mg/dL   BUN 13 6 - 20 mg/dL   Creatinine, Ser 1.02 0.61 - 1.24 mg/dL   Calcium 9.2 8.9 - 10.3 mg/dL   GFR calc non Af Amer >60 >60 mL/min   GFR calc Af Amer >60 >60 mL/min    Comment: (NOTE) The eGFR has been calculated using the CKD EPI equation. This calculation has not been validated in all clinical situations. eGFR's persistently <60 mL/min signify possible Chronic Kidney Disease.    Anion gap 13 5 - 15  POCT INR     Status: None   Collection Time: 03/23/16 12:00 AM  Result Value Ref Range   INR 1.6   CBC w/Diff/Platelet     Status: None   Collection Time: 03/24/16  1:48 PM  Result Value Ref Range   WBC 7.3 4.0 - 10.5 K/uL   RBC 4.83 4.22 - 5.81 MIL/uL   Hemoglobin 13.7 13.0 - 17.0 g/dL   HCT 41.8 39.0 - 52.0 %   MCV 86.5 78.0 - 100.0 fL   MCH 28.4 26.0 - 34.0 pg   MCHC 32.8 30.0 - 36.0 g/dL   RDW 15.0 11.5 - 15.5 %   Platelets 177 150 - 400 K/uL   Neutrophils Relative % 62 %   Neutro Abs 4.5 1.7 - 7.7 K/uL   Lymphocytes Relative 27 %   Lymphs Abs 2.0 0.7 - 4.0 K/uL   Monocytes Relative 9 %   Monocytes Absolute 0.6 0.1 - 1.0 K/uL   Eosinophils Relative 2 %   Eosinophils Absolute 0.1 0.0 - 0.7 K/uL   Basophils Relative 0 %   Basophils Absolute 0.0 0.0 - 0.1 K/uL  Lactate Dehydrogenase (LDH)     Status: Abnormal   Collection Time: 03/24/16  1:48 PM  Result Value Ref Range   LDH 230 (H) 98 - 192 U/L  INR/PT     Status: Abnormal   Collection Time: 03/24/16  1:48 PM  Result Value Ref Range   Prothrombin  Time 19.6 (H) 11.4 - 15.2 seconds   INR 1.63   B Nat Peptide     Status: None   Collection Time: 03/24/16  1:48 PM  Result Value Ref Range   B Natriuretic Peptide 37.6 0.0 - 100.0 pg/mL  Comprehensive Metabolic Panel (CMET)     Status: None   Collection Time: 03/24/16  1:51 PM  Result Value Ref Range   Sodium 139 135 - 145 mmol/L   Potassium 3.9 3.5 - 5.1 mmol/L    Comment: SLIGHT HEMOLYSIS   Chloride 107 101 - 111 mmol/L   CO2 25 22 - 32 mmol/L   Glucose, Bld 96 65 - 99 mg/dL   BUN 10 6 - 20 mg/dL   Creatinine, Ser 1.14 0.61 - 1.24 mg/dL   Calcium 9.1 8.9 - 10.3 mg/dL   Total Protein 7.1 6.5 - 8.1 g/dL   Albumin 4.1 3.5 - 5.0 g/dL   AST 25 15 - 41 U/L   ALT 20 17 - 63 U/L   Alkaline Phosphatase 90 38 - 126 U/L   Total Bilirubin 0.8 0.3 - 1.2 mg/dL   GFR calc non Af Amer >60 >60 mL/min   GFR calc Af Amer >60 >60 mL/min    Comment: (NOTE) The eGFR has been calculated using the CKD EPI equation. This calculation has not been validated in all clinical situations. eGFR's persistently <60 mL/min signify possible Chronic Kidney Disease.    Anion gap 7 5 - 15  Protime-INR  Status: Abnormal   Collection Time: 03/25/16  2:45 AM  Result Value Ref Range   Prothrombin Time 21.2 (H) 11.4 - 15.2 seconds   INR 1.80   CBC     Status: Abnormal   Collection Time: 03/25/16  2:45 AM  Result Value Ref Range   WBC 7.8 4.0 - 10.5 K/uL   RBC 4.43 4.22 - 5.81 MIL/uL   Hemoglobin 12.6 (L) 13.0 - 17.0 g/dL   HCT 38.4 (L) 39.0 - 52.0 %   MCV 86.7 78.0 - 100.0 fL   MCH 28.4 26.0 - 34.0 pg   MCHC 32.8 30.0 - 36.0 g/dL   RDW 15.1 11.5 - 15.5 %   Platelets 173 150 - 400 K/uL  CBC     Status: Abnormal   Collection Time: 03/26/16 11:20 AM  Result Value Ref Range   WBC 7.2 4.0 - 10.5 K/uL   RBC 4.28 4.22 - 5.81 MIL/uL   Hemoglobin 12.3 (L) 13.0 - 17.0 g/dL   HCT 37.0 (L) 39.0 - 52.0 %   MCV 86.4 78.0 - 100.0 fL   MCH 28.7 26.0 - 34.0 pg   MCHC 33.2 30.0 - 36.0 g/dL   RDW 15.2 11.5 - 15.5  %   Platelets 166 150 - 400 K/uL  INR/PT     Status: Abnormal   Collection Time: 03/26/16 11:20 AM  Result Value Ref Range   Prothrombin Time 21.4 (H) 11.4 - 15.2 seconds   INR 1.83   Lactate Dehydrogenase (LDH)     Status: Abnormal   Collection Time: 03/26/16 11:20 AM  Result Value Ref Range   LDH 205 (H) 98 - 192 U/L  Protime-INR     Status: None   Collection Time: 03/30/16  9:23 AM  Result Value Ref Range   Prothrombin Time 13.8 11.4 - 15.2 seconds   INR 1.06   Lactate dehydrogenase     Status: None   Collection Time: 03/30/16  9:23 AM  Result Value Ref Range   LDH 176 98 - 192 U/L  Protime-INR     Status: None   Collection Time: 04/02/16  9:25 AM  Result Value Ref Range   Prothrombin Time 15.2 11.4 - 15.2 seconds   INR 1.19   Lactate dehydrogenase     Status: None   Collection Time: 04/02/16  9:25 AM  Result Value Ref Range   LDH 186 98 - 192 U/L  Lactate dehydrogenase     Status: Abnormal   Collection Time: 04/06/16  9:29 AM  Result Value Ref Range   LDH 225 (H) 98 - 192 U/L  Protime-INR     Status: Abnormal   Collection Time: 04/06/16  9:29 AM  Result Value Ref Range   Prothrombin Time 25.7 (H) 11.4 - 15.2 seconds   INR 2.30     ASSESSMENT AND PLAN:   1) Chronic systolic HF, s/p HMII LVAD implant 05/2012.  -  NYHA I-II. - Listed 1A at [facility] Also with visit to [facility] for dual listing.  - He is not on a BB. He was able to tolerate low-doseToprol before LVAD, however has been intolerant since that time. Volume status stable.  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 Reapplication of ACell. No further bleeding. Plan to change dressing next week.  Deep wound culture showed Serratia again. Continue ceftriaxone 2 grams every 12 hours per ID. Plan for ceftriaxone at least 6 wks. PICC site ok.  -  He will follow up with Dr Johnnye Sima the end of January.   3) HTN-  - Stable today. Continue current  regimen.    improved with Entresto 97/03 bid.  3) Hyperthyroidism - Amio induced. Follows with Dr. Forde Dandy. Amio stopped. Recent TSH 02/2016 1.422   4) NSVT/VT  - Off amio. On mexilitene. - ICD now at EOL. We have decided to not replace given that risk of infection felt to be higher than benefit or replacing as VT usually well tolerated in setting of VAD support 5) PVCs - Resolved.   6) Anticoagulation with coumadin  - Continue coumadin and ASA 325 mg for LVAD. INR therapeutic.  7) LVAD  - Stable. Parameters stable.   INR 2.3  Follow up next week for dressing change and reapplication of ACell Greater than 25 minutes spent performing complex dressing change around drive line.  Amy Clegg NP-C  8:35 AM

## 2016-04-13 ENCOUNTER — Ambulatory Visit (HOSPITAL_COMMUNITY)
Admission: RE | Admit: 2016-04-13 | Discharge: 2016-04-13 | Disposition: A | Discharge status: Home / Self Care | Payer: Medicaid Other | Source: Ambulatory Visit | Attending: Cardiology | Admitting: Cardiology

## 2016-04-13 ENCOUNTER — Other Ambulatory Visit (HOSPITAL_COMMUNITY): Payer: Self-pay

## 2016-04-13 ENCOUNTER — Ambulatory Visit (HOSPITAL_COMMUNITY): Payer: Self-pay | Admitting: Pharmacist

## 2016-04-13 ENCOUNTER — Inpatient Hospital Stay (HOSPITAL_COMMUNITY): Admission: RE | Admit: 2016-04-13 | Payer: Medicaid Other | Source: Ambulatory Visit

## 2016-04-13 VITALS — BP 59/48 | HR 68 | Wt 252.2 lb

## 2016-04-13 DIAGNOSIS — I110 Hypertensive heart disease with heart failure: Secondary | ICD-10-CM | POA: Diagnosis not present

## 2016-04-13 DIAGNOSIS — A498 Other bacterial infections of unspecified site: Secondary | ICD-10-CM

## 2016-04-13 DIAGNOSIS — I509 Heart failure, unspecified: Secondary | ICD-10-CM | POA: Insufficient documentation

## 2016-04-13 DIAGNOSIS — E0590 Thyrotoxicosis, unspecified without thyrotoxic crisis or storm: Secondary | ICD-10-CM

## 2016-04-13 DIAGNOSIS — Z79899 Other long term (current) drug therapy: Secondary | ICD-10-CM | POA: Diagnosis not present

## 2016-04-13 DIAGNOSIS — I10 Essential (primary) hypertension: Secondary | ICD-10-CM

## 2016-04-13 DIAGNOSIS — T829XXD Unspecified complication of cardiac and vascular prosthetic device, implant and graft, subsequent encounter: Secondary | ICD-10-CM | POA: Diagnosis not present

## 2016-04-13 DIAGNOSIS — Z7901 Long term (current) use of anticoagulants: Secondary | ICD-10-CM

## 2016-04-13 DIAGNOSIS — Z95811 Presence of heart assist device: Secondary | ICD-10-CM

## 2016-04-13 DIAGNOSIS — I428 Other cardiomyopathies: Secondary | ICD-10-CM | POA: Diagnosis not present

## 2016-04-13 DIAGNOSIS — Z7982 Long term (current) use of aspirin: Secondary | ICD-10-CM | POA: Insufficient documentation

## 2016-04-13 LAB — CBC
HEMATOCRIT: 39.2 % (ref 39.0–52.0)
HEMOGLOBIN: 13 g/dL (ref 13.0–17.0)
MCH: 28.5 pg (ref 26.0–34.0)
MCHC: 33.2 g/dL (ref 30.0–36.0)
MCV: 86 fL (ref 78.0–100.0)
Platelets: 189 10*3/uL (ref 150–400)
RBC: 4.56 MIL/uL (ref 4.22–5.81)
RDW: 14.6 % (ref 11.5–15.5)
WBC: 7.1 10*3/uL (ref 4.0–10.5)

## 2016-04-13 LAB — PROTIME-INR
INR: 2.13
PROTHROMBIN TIME: 24.2 s — AB (ref 11.4–15.2)

## 2016-04-13 LAB — LACTATE DEHYDROGENASE: LDH: 225 U/L — AB (ref 98–192)

## 2016-04-13 MED ORDER — HYDRALAZINE HCL 50 MG PO TABS
25.0000 mg | ORAL_TABLET | Freq: Two times a day (BID) | ORAL | 6 refills | Status: DC
Start: 2016-04-13 — End: 2023-01-12

## 2016-04-13 NOTE — Patient Instructions (Signed)
HOLD Entresto and Hydralazine tonight.  STOP amlodipine.  Tomorrow DECREASE Hydralazine to 25 mg twice daily.  Routine lab work today. Will notify you of abnormal results, otherwise no news is good news!  Return next Tuesday at 9:00 am.

## 2016-04-13 NOTE — Addendum Note (Signed)
Addended by: Chyrl Civatte on: 04/13/2016 09:46 AM   Modules accepted: Orders

## 2016-04-13 NOTE — Progress Notes (Signed)
Patient presents for 2 month follow up, driveline dressing change with acell powder, and labs (INR, LDH, CBC) in VAD Clinic today. Reports no problems with VAD equipment or concerns with drive line.  Vital Signs:  Doppler Pressure: 66 Automatc BP: 59/48 (44) HR: 68 SPO2: 98 % Weight: 252.4 lbs (with batteries and controller) Last weight: 259 lb (03/25/16) BMI today: 32.39  VAD interrogation & Equipment Management: Speed: 9400 Flow: 5.6 Power: 5.8 w  PI: 6.0 Alarms: None Events: Rare PIs Fixed speed: 9400 Low speed limit: 8800  I reviewed the LVAD parameters from today and compared the results to the patient's prior recorded data. LVAD interrogation was NEGATIVE for significant power changes, NEGATIVE for clinical alarms and STABLE for PI events/speed drops. No programming changes were made and pump is functioning within specified parameters. Pt is performing daily controller and system monitor self tests along with completing weekly and monthly maintenance for LVAD equipment.  LVAD equipment check completed and is in good working order. Back-up equipment present. Charged back up battery and performed self-test on equipment.   Exit Site Care: Drive line is being maintained weekly by heart failure clinic for recurrent bleeding status post debridement and wound vac. Amy Clegg NP-C changed wound dressing today with reapplication of Acell powder (see note for further details).  Stabilization device present and accurately applied. Pt denies fever or chills. Pt states they have adequate dressing supplies at home.    Labs:  (04/13/2016): INR 2.13, LDH 225, WBC 7.1, H/H 12.3/37.0, platelets 189  Symptom Yes No Details  Angina  X Activity:  Claudication  X How far:  Syncope  X When:  Stroke  X   Orthopnea  X How many pillows:  PND  X How often:  CPAP  X How many hrs:  Pedal edema  X   Abd fullness  X   N&V  X   Diaphoresis  X When:  Bleeding  X   Urine  X   SOB   X Activity:  Palpitations  X When:  ICD shock  X   Hospitlizaitons  X When/where/why:  ED visit  X When/where/why:  Other MD  X When/who/why:  Activity     Fluid     Diet      Encounter Details: 2 month follow up with weekly driveline dressing change involving Acell completed.  Labs (INR, LDH, CBC).  Medication changes at this encounter: BP lower than baseline at 59/48 (44), doppler 66--BP 04/06/2016 was 105/85 HOLD Entresto and Hydralazine this afternoon. STOP Amlodipine. Tomorrow: Restart Hydralazine at reduced dose of 25 mg twice daily.  Plan of care: Follow up 1 week for dressing change with acell and recheck MAP.

## 2016-04-13 NOTE — Progress Notes (Signed)
inr

## 2016-04-14 ENCOUNTER — Encounter (HOSPITAL_COMMUNITY): Payer: Medicaid Other

## 2016-04-14 NOTE — Progress Notes (Signed)
LVAD CLINIC NOTE  Patient ID: Robert [name], male   DOB: [birth year], 61 y.o.   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 status post dual chamber Medtronic ICD implanted April 2011. Cath 2011 showed normal coronaries. He also has a history of VTach .   Amio recently stopped due to thyrotoxicity. Placed on prednisone and tapazole. Had VT off amio and mexilitene started.  Had [place] on 8/17 as part of dual listing process at [facility] Normal filling pressures and cardiac output.   Drive Line Infeciton  08/26/15 >>Serratia marcescens (rare) >>Cipro 500 mg bid x 7 days  09/29/15 >> Serratia marcescens (abundant) >>Cipro 500 mg BID x14d 11/20/15 >>Serratia marcescens (few) Admitted IV ceftriaxone >>cipro 500 mg twice daily x 30 days 02/03/16 >> Serratia species >> Cipro 500 mg BID x 14d; referral to ID  03/12/2016 Surgical Debridement with VAC placement. Full thickness 5x8x6 cm. 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 Plan to continued ced on cefepime 1 gram IV every 8 hours  03/16/2016 PICC line placed for IV antibiotics.  03/17/2016 VAC reapplied with 1 gram of A Cell applied. 03/25/2016 VAC removed Damp saline dressing applied.  04/02/2016 Acell applied   Admitted to [facility] on 12/5 for heparin bridge and surgical debridement of persistent drive line infection. VAC applied with A cell. VAC maintained seal at 75 mm hg continuous therapy. Plan for ceftriaxone for 6 wks until 04/23/16. Will also need VAC changes weekly with ACell. [facility] following for home antibiotics.   He returns for LVAD follow up for drive line dressing change. Overall feels good. Denies SOB/PND/Orthopnea. Denies fever or chills. No BRBPR or from drive line infection. Dressing remains intact. has follow up with ID, Dr  Johnnye Sima January 30th.  Followed by [facility] for home antibiotics. No PICC issues. Denies R upper arm pain.    Recent RHC on 11/06/15 as part of Transplant process RA = 6 RV =  18/3/6 PA = 19/7 (12) PCW = 8 Fick cardiac output/index = 6.1/2.6 PVR = .0.7 WU Ao sat = 98% PA sat = 70%, 69%:   LVAD interrogation reveals:  Speed:  9400 Flow: 5.6 Power: 5.8 PI: 6.0  Alarms:  none Events: ~ 5 PI events daily  Fixed speed:  9400 Low speed limit:  8800  11V battery status: Primary controller: replace 16 months                                   Secondary controller: replace 15 months    I reviewed the LVAD parameters from today, and compared the results to the patient's prior recorded data.  No programming changes were made.  The LVAD is functioning within specified parameters.  The patient performs LVAD self-test daily.  LVAD interrogation was negative for any significant power changes, alarms or PI events/speed drops.  LVAD equipment check completed and is in good working order.  Back-up equipment present.   LVAD education done on emergency procedures and precautions and reviewed exit site care.    Past Medical History:  Diagnosis Date  . AICD (automatic cardioverter/defibrillator) present 2011  . Bronchitis   . Congestive heart failure ([place]) 2011  . Hypertension 2000  . LVAD (left  ventricular assist device) present ([place]) 2014  . Pneumonia   . Pulmonary hypertension     Current Outpatient Prescriptions  Medication Sig Dispense Refill  . allopurinol (ZYLOPRIM) 300 MG tablet Take 1 tablet (300 mg total) by mouth daily. 30 tablet 6  . aspirin EC 325 MG tablet Take 1 tablet (325 mg total) by mouth daily. 30 tablet 6  . cefTRIAXone (ROCEPHIN) IVPB Inject 2 g into the vein every 12 (twelve) hours. Indication:  LVAD infection Last Day of Therapy:  04/23/16 Labs - Once weekly:  CBC/D and BMP, Labs - Every other week:  ESR and CRP 70 Units 0  . furosemide (LASIX) 40 MG tablet Take 1 tablet (40  mg total) by mouth daily as needed. Take as directed by clinic 30 tablet 3  . hydrALAZINE (APRESOLINE) 50 MG tablet Take 0.5 tablets (25 mg total) by mouth 2 (two) times daily. 30 tablet 6  . LOVENOX 100 MG/ML injection Inject 1 mL (100 mg total) into the skin every 12 (twelve) hours. 10 Syringe 1  . mexiletine (MEXITIL) 150 MG capsule Take 1 capsule (150 mg total) by mouth 2 (two) times daily. 60 capsule 5  . Multiple Vitamin (MULTIVITAMIN) tablet Take 1 tablet by mouth daily.    . pantoprazole (PROTONIX) 40 MG tablet Take 1 tablet (40 mg total) by mouth daily. 30 tablet 3  . sacubitril-valsartan (ENTRESTO) 97-103 MG Take 1 tablet by mouth 2 (two) times daily. 60 tablet 3  . traMADol (ULTRAM) 50 MG tablet Take 1 tablet (50 mg total) by mouth every 8 (eight) hours as needed for moderate pain. 30 tablet 5  . warfarin (COUMADIN) 5 MG tablet Take daily as directed by VAD clinic based on weekly INR results. 30 tablet 3   No current facility-administered medications for this encounter.     Metoprolol and Imdur [isosorbide nitrate]  REVIEW OF SYSTEMS: All systems negative except as listed in HPI, PMH and Problem list.  Doppler Pressure:100 Automatc BP: 105/85 (92) HR: 72 SPO2: 100 % Wt: Reported 242 pounds.  Ht:  6'2"  Physical Exam: GENERAL: Well appearing, male who presents to clinic.  No acute distress HEENT: normal  NECK: Supple, JVP  5-6;  2+ bilaterally, no bruits.  No lymphadenopathy or thyromegaly appreciated.  CARDIAC:  Mechanical heart sounds with LVAD hum present.  LUNGS:  Clear.  ABDOMEN:  Soft, round, nontender, positive bowel sounds x4.     LVAD exit site: Full thickness wound around driveline. 100% granulation. No odor. Minimal exudate. Driveline has multiple twists noted. EXTREMITIES:  Warm , no cyanosis, clubbing, rash. RUE PICC double lumen.  RUE non tender NEUROLOGIC:  Alert and oriented x 4.  Gait steady.  No aphasia.  No dysarthria.  Affect pleasant.     Recent  Results (from the past 2160 hour(s))  Protime-INR     Status: Abnormal   Collection Time: 01/28/16 10:18 AM  Result Value Ref Range   Prothrombin Time 27.1 (H) 11.4 - 15.2 seconds   INR 5.80   Basic metabolic panel     Status: None   Collection Time: 02/03/16 12:43 PM  Result Value Ref Range   Sodium 139 135 - 145 mmol/L   Potassium 4.0 3.5 - 5.1 mmol/L   Chloride 106 101 - 111 mmol/L   CO2 26 22 - 32 mmol/L   Glucose, Bld 90 65 - 99 mg/dL   BUN 12 6 - 20 mg/dL   Creatinine, Ser 1.10 0.61 - 1.24 mg/dL  Calcium 9.2 8.9 - 10.3 mg/dL   GFR calc non Af Amer >60 >60 mL/min   GFR calc Af Amer >60 >60 mL/min    Comment: (NOTE) The eGFR has been calculated using the CKD EPI equation. This calculation has not been validated in all clinical situations. eGFR's persistently <60 mL/min signify possible Chronic Kidney Disease.    Anion gap 7 5 - 15  CBC     Status: None   Collection Time: 02/03/16 12:43 PM  Result Value Ref Range   WBC 7.0 4.0 - 10.5 K/uL   RBC 5.03 4.22 - 5.81 MIL/uL   Hemoglobin 14.3 13.0 - 17.0 g/dL   HCT 43.5 39.0 - 52.0 %   MCV 86.5 78.0 - 100.0 fL   MCH 28.4 26.0 - 34.0 pg   MCHC 32.9 30.0 - 36.0 g/dL   RDW 15.2 11.5 - 15.5 %   Platelets 172 150 - 400 K/uL  B Nat Peptide     Status: None   Collection Time: 02/03/16 12:43 PM  Result Value Ref Range   B Natriuretic Peptide 66.1 0.0 - 100.0 pg/mL  INR/PT     Status: Abnormal   Collection Time: 02/03/16 12:43 PM  Result Value Ref Range   Prothrombin Time 24.5 (H) 11.4 - 15.2 seconds   INR 2.17   Aerobic Culture (superficial specimen)     Status: None   Collection Time: 02/03/16  3:32 PM  Result Value Ref Range   Specimen Description WOUND ABDOMEN    Special Requests DRIVE LINE SITE    Gram Stain      FEW WBC PRESENT, PREDOMINANTLY PMN NO ORGANISMS SEEN    Culture RARE SERRATIA SPECIES    Report Status 02/06/2016 FINAL    Organism ID, Bacteria SERRATIA SPECIES       Susceptibility   Serratia species -  MIC*    CEFAZOLIN >=64 RESISTANT Resistant     CEFEPIME <=1 SENSITIVE Sensitive     CEFTAZIDIME <=1 SENSITIVE Sensitive     CEFTRIAXONE <=1 SENSITIVE Sensitive     CIPROFLOXACIN 1 SENSITIVE Sensitive     GENTAMICIN <=1 SENSITIVE Sensitive     TRIMETH/SULFA 40 SENSITIVE Sensitive     * RARE SERRATIA SPECIES  TSH     Status: None   Collection Time: 02/05/16  3:36 PM  Result Value Ref Range   TSH 1.422 0.350 - 4.500 uIU/mL    Comment: Performed by a 3rd Generation assay with a functional sensitivity of <=0.01 uIU/mL.  T4, free     Status: None   Collection Time: 02/05/16  3:36 PM  Result Value Ref Range   Free T4 0.95 0.61 - 1.12 ng/dL    Comment: (NOTE) Biotin ingestion may interfere with free T4 tests. If the results are inconsistent with the TSH level, previous test results, or the clinical presentation, then consider biotin interference. If needed, order repeat testing after stopping biotin.   T3, free     Status: None   Collection Time: 02/05/16  3:36 PM  Result Value Ref Range   T3, Free 2.6 2.0 - 4.4 pg/mL    Comment: (NOTE) Performed At: [facility] [place], [place] [id number] Lindon Romp MD WK:[id number]   Lactate dehydrogenase     Status: Abnormal   Collection Time: 02/13/16 10:49 AM  Result Value Ref Range   LDH 209 (H) 98 - 192 U/L  Protime-INR     Status: Abnormal   Collection Time: 02/13/16 10:49 AM  Result Value Ref Range   Prothrombin Time 20.2 (H) 11.4 - 15.2 seconds   INR 1.70   CBC with Differential     Status: Abnormal   Collection Time: 02/13/16 10:49 AM  Result Value Ref Range   WBC 6.5 4.0 - 10.5 K/uL   RBC 4.70 4.22 - 5.81 MIL/uL   Hemoglobin 13.2 13.0 - 17.0 g/dL   HCT 40.5 39.0 - 52.0 %   MCV 86.2 78.0 - 100.0 fL   MCH 28.1 26.0 - 34.0 pg   MCHC 32.6 30.0 - 36.0 g/dL   RDW 15.0 11.5 - 15.5 %   Platelets 133 (L) 150 - 400 K/uL   Neutrophils Relative % 67 %   Neutro Abs 4.3 1.7 - 7.7 K/uL   Lymphocytes Relative  24 %   Lymphs Abs 1.6 0.7 - 4.0 K/uL   Monocytes Relative 8 %   Monocytes Absolute 0.5 0.1 - 1.0 K/uL   Eosinophils Relative 1 %   Eosinophils Absolute 0.1 0.0 - 0.7 K/uL   Basophils Relative 0 %   Basophils Absolute 0.0 0.0 - 0.1 K/uL  INR/PT     Status: Abnormal   Collection Time: 02/20/16 10:22 AM  Result Value Ref Range   Prothrombin Time 29.2 (H) 11.4 - 15.2 seconds   INR 2.69   CULTURE, ROUTINE-ABSCESS     Status: None   Collection Time: 02/25/16  3:30 PM  Result Value Ref Range   Culture Moderate SERRATIA MARCESCENS    Gram Stain Moderate    Gram Stain WBC present-both PMN and Mononuclear    Gram Stain No Squamous Epithelial Cells Seen    Gram Stain No Organisms Seen    Organism ID, Bacteria SERRATIA MARCESCENS       Susceptibility   Serratia marcescens -  (no method available)    AMOX/CLAVULANIC  Resistant     CEFAZOLIN >=64 Resistant     CEFTRIAXONE <=1 Sensitive     CEFTAZIDIME <=1 Sensitive     CEFEPIME <=1 Sensitive     GENTAMICIN <=1 Sensitive     TOBRAMYCIN <=1 Sensitive     CIPROFLOXACIN 2 Intermediate     LEVOFLOXACIN 4 Intermediate     TRIMETH/SULFA* <=20 Sensitive      * NR=NOT REPORTABLE,SEE COMMENTORAL therapy:A cefazolin MIC of <32 predicts susceptibility to the oral agents cefaclor,cefdinir,cefpodoxime,cefprozil,cefuroxime,cephalexin,and loracarbef when used for therapy of uncomplicated UTIs due to E.coli,K.pneumomiae,and P.mirabilis. PARENTERAL therapy: A cefazolinMIC of >8 indicates resistance to parenteralcefazolin. An alternate test method must beperformed to confirm susceptibility to parenteralcefazolin.  INR/PT     Status: Abnormal   Collection Time: 02/25/16  3:53 PM  Result Value Ref Range   Prothrombin Time 28.6 (H) 11.4 - 15.2 seconds   INR 2.62   Protime-INR     Status: Abnormal   Collection Time: 03/09/16  6:11 PM  Result Value Ref Range   Prothrombin Time 21.1 (H) 11.4 - 15.2 seconds   INR 1.80   Lactate dehydrogenase     Status:  Abnormal   Collection Time: 03/09/16  6:11 PM  Result Value Ref Range   LDH 224 (H) 98 - 192 U/L  MRSA PCR Screening     Status: None   Collection Time: 03/09/16  6:23 PM  Result Value Ref Range   MRSA by PCR NEGATIVE NEGATIVE    Comment:        The GeneXpert MRSA Assay (FDA approved for NASAL specimens only), is one component of a comprehensive MRSA colonization surveillance program.  It is not intended to diagnose MRSA infection nor to guide or monitor treatment for MRSA infections.   Heparin level (unfractionated)     Status: None   Collection Time: 03/10/16  4:32 AM  Result Value Ref Range   Heparin Unfractionated 0.48 0.30 - 0.70 IU/mL    Comment:        IF HEPARIN RESULTS ARE BELOW EXPECTED VALUES, AND PATIENT DOSAGE HAS BEEN CONFIRMED, SUGGEST FOLLOW UP TESTING OF ANTITHROMBIN III LEVELS.   Basic metabolic panel     Status: Abnormal   Collection Time: 03/10/16  4:32 AM  Result Value Ref Range   Sodium 138 135 - 145 mmol/L   Potassium 3.6 3.5 - 5.1 mmol/L   Chloride 105 101 - 111 mmol/L   CO2 27 22 - 32 mmol/L   Glucose, Bld 88 65 - 99 mg/dL   BUN 9 6 - 20 mg/dL   Creatinine, Ser 1.00 0.61 - 1.24 mg/dL   Calcium 8.5 (L) 8.9 - 10.3 mg/dL   GFR calc non Af Amer >60 >60 mL/min   GFR calc Af Amer >60 >60 mL/min    Comment: (NOTE) The eGFR has been calculated using the CKD EPI equation. This calculation has not been validated in all clinical situations. eGFR's persistently <60 mL/min signify possible Chronic Kidney Disease.    Anion gap 6 5 - 15  CBC     Status: Abnormal   Collection Time: 03/10/16  4:32 AM  Result Value Ref Range   WBC 6.4 4.0 - 10.5 K/uL   RBC 4.60 4.22 - 5.81 MIL/uL   Hemoglobin 13.1 13.0 - 17.0 g/dL   HCT 39.1 39.0 - 52.0 %   MCV 85.0 78.0 - 100.0 fL   MCH 28.5 26.0 - 34.0 pg   MCHC 33.5 30.0 - 36.0 g/dL   RDW 15.0 11.5 - 15.5 %   Platelets 146 (L) 150 - 400 K/uL  Lactate dehydrogenase     Status: Abnormal   Collection Time: 03/10/16   4:32 AM  Result Value Ref Range   LDH 194 (H) 98 - 192 U/L  Protime-INR     Status: Abnormal   Collection Time: 03/10/16  4:32 AM  Result Value Ref Range   Prothrombin Time 19.6 (H) 11.4 - 15.2 seconds   INR 1.63   Heparin level (unfractionated)     Status: Abnormal   Collection Time: 03/11/16  3:23 AM  Result Value Ref Range   Heparin Unfractionated 0.85 (H) 0.30 - 0.70 IU/mL    Comment:        IF HEPARIN RESULTS ARE BELOW EXPECTED VALUES, AND PATIENT DOSAGE HAS BEEN CONFIRMED, SUGGEST FOLLOW UP TESTING OF ANTITHROMBIN III LEVELS.   CBC     Status: None   Collection Time: 03/11/16  3:23 AM  Result Value Ref Range   WBC 7.9 4.0 - 10.5 K/uL   RBC 5.03 4.22 - 5.81 MIL/uL   Hemoglobin 14.2 13.0 - 17.0 g/dL   HCT 42.6 39.0 - 52.0 %   MCV 84.7 78.0 - 100.0 fL   MCH 28.2 26.0 - 34.0 pg   MCHC 33.3 30.0 - 36.0 g/dL   RDW 15.1 11.5 - 15.5 %   Platelets 180 150 - 400 K/uL  Lactate dehydrogenase     Status: Abnormal   Collection Time: 03/11/16  3:23 AM  Result Value Ref Range   LDH 217 (H) 98 - 192 U/L  Protime-INR     Status: Abnormal   Collection Time: 03/11/16  3:23 AM  Result Value Ref Range   Prothrombin Time 16.3 (H) 11.4 - 15.2 seconds   INR 1.30   Heparin level (unfractionated)     Status: None   Collection Time: 03/11/16 10:56 AM  Result Value Ref Range   Heparin Unfractionated 0.62 0.30 - 0.70 IU/mL    Comment:        IF HEPARIN RESULTS ARE BELOW EXPECTED VALUES, AND PATIENT DOSAGE HAS BEEN CONFIRMED, SUGGEST FOLLOW UP TESTING OF ANTITHROMBIN III LEVELS.   Type and screen [place]     Status: None   Collection Time: 03/11/16  8:58 PM  Result Value Ref Range   ABO/RH(D) O POS    Antibody Screen NEG    Sample Expiration 03/14/2016   Surgical PCR screen     Status: None   Collection Time: 03/11/16 11:40 PM  Result Value Ref Range   MRSA, PCR NEGATIVE NEGATIVE   Staphylococcus aureus NEGATIVE NEGATIVE    Comment:        The Xpert SA  Assay (FDA approved for NASAL specimens in patients over 47 years of age), is one component of a comprehensive surveillance program.  Test performance has been validated by [facility] for patients greater than or equal to 17 year old. It is not intended to diagnose infection nor to guide or monitor treatment.   Lactate dehydrogenase     Status: None   Collection Time: 03/12/16  2:13 AM  Result Value Ref Range   LDH 176 98 - 192 U/L  Protime-INR     Status: Abnormal   Collection Time: 03/12/16  2:13 AM  Result Value Ref Range   Prothrombin Time 15.5 (H) 11.4 - 15.2 seconds   INR 1.22   CBC     Status: None   Collection Time: 03/12/16  2:13 AM  Result Value Ref Range   WBC 7.5 4.0 - 10.5 K/uL   RBC 4.71 4.22 - 5.81 MIL/uL   Hemoglobin 13.4 13.0 - 17.0 g/dL   HCT 40.2 39.0 - 52.0 %   MCV 85.4 78.0 - 100.0 fL   MCH 28.5 26.0 - 34.0 pg   MCHC 33.3 30.0 - 36.0 g/dL   RDW 15.1 11.5 - 15.5 %   Platelets 161 150 - 400 K/uL  APTT     Status: Abnormal   Collection Time: 03/12/16  2:13 AM  Result Value Ref Range   aPTT 140 (H) 24 - 36 seconds    Comment:        IF BASELINE aPTT IS ELEVATED, SUGGEST PATIENT RISK ASSESSMENT BE USED TO DETERMINE APPROPRIATE ANTICOAGULANT THERAPY.   Blood gas, arterial     Status: None   Collection Time: 03/12/16  3:26 AM  Result Value Ref Range   pH, Arterial 7.404 7.350 - 7.450   pCO2 arterial 40.7 32.0 - 48.0 mmHg   pO2, Arterial 84.2 83.0 - 108.0 mmHg   Bicarbonate 24.9 20.0 - 28.0 mmol/L   Acid-Base Excess 0.7 0.0 - 2.0 mmol/L   O2 Saturation 95.8 %   Patient temperature 98.6    Collection site RIGHT RADIAL    Drawn by [id number]    Sample type ARTERIAL DRAW    Allens test (pass/fail) PASS PASS  Urinalysis, Routine w reflex microscopic     Status: None   Collection Time: 03/12/16  5:21 AM  Result Value Ref Range   Color, Urine YELLOW YELLOW   APPearance CLEAR CLEAR   Specific Gravity, Urine 1.011 1.005 -  1.030   pH 6.0 5.0 - 8.0    Glucose, UA NEGATIVE NEGATIVE mg/dL   Hgb urine dipstick NEGATIVE NEGATIVE   Bilirubin Urine NEGATIVE NEGATIVE   Ketones, ur NEGATIVE NEGATIVE mg/dL   Protein, ur NEGATIVE NEGATIVE mg/dL   Nitrite NEGATIVE NEGATIVE   Leukocytes, UA NEGATIVE NEGATIVE  Anaerobic culture     Status: None   Collection Time: 03/12/16  8:18 AM  Result Value Ref Range   Specimen Description WOUND ABDOMEN    Special Requests SPEC A    Culture NO ANAEROBES ISOLATED    Report Status 03/17/2016 FINAL   Aerobic Culture (superficial specimen)     Status: None   Collection Time: 03/12/16  8:18 AM  Result Value Ref Range   Specimen Description WOUND ABDOMEN    Special Requests SPEC A    Gram Stain      ABUNDANT WBC PRESENT, PREDOMINANTLY PMN NO ORGANISMS SEEN    Culture      FEW SERRATIA MARCESCENS CRITICAL RESULT CALLED TO, READ BACK BY AND VERIFIED WITH: RN K.JOHNSON [id number] MLM    Report Status 03/14/2016 FINAL    Organism ID, Bacteria SERRATIA MARCESCENS       Susceptibility   Serratia marcescens - MIC*    CEFAZOLIN >=64 RESISTANT Resistant     CEFEPIME <=1 SENSITIVE Sensitive     CEFTAZIDIME <=1 SENSITIVE Sensitive     CEFTRIAXONE <=1 SENSITIVE Sensitive     CIPROFLOXACIN 2 INTERMEDIATE Intermediate     GENTAMICIN <=1 SENSITIVE Sensitive     TRIMETH/SULFA <=20 SENSITIVE Sensitive     * FEW SERRATIA MARCESCENS  Aerobic/Anaerobic Culture (surgical/deep wound)     Status: None   Collection Time: 03/12/16  8:24 AM  Result Value Ref Range   Specimen Description TISSUE ABDOMEN    Special Requests SPEC B    Gram Stain      FEW WBC PRESENT,BOTH PMN AND MONONUCLEAR NO ORGANISMS SEEN    Culture      FEW SERRATIA MARCESCENS CRITICAL RESULT CALLED TO, READ BACK BY AND VERIFIED WITH: RN K.JOHNSON [id number] MLM NO ANAEROBES ISOLATED    Report Status 03/17/2016 FINAL    Organism ID, Bacteria SERRATIA MARCESCENS       Susceptibility   Serratia marcescens - MIC*    CEFAZOLIN >=64 RESISTANT  Resistant     CEFEPIME <=1 SENSITIVE Sensitive     CEFTAZIDIME <=1 SENSITIVE Sensitive     CEFTRIAXONE <=1 SENSITIVE Sensitive     CIPROFLOXACIN 2 INTERMEDIATE Intermediate     GENTAMICIN <=1 SENSITIVE Sensitive     TRIMETH/SULFA <=20 SENSITIVE Sensitive     * FEW SERRATIA MARCESCENS  Heparin level (unfractionated)     Status: None   Collection Time: 03/12/16  9:34 PM  Result Value Ref Range   Heparin Unfractionated 0.30 0.30 - 0.70 IU/mL    Comment:        IF HEPARIN RESULTS ARE BELOW EXPECTED VALUES, AND PATIENT DOSAGE HAS BEEN CONFIRMED, SUGGEST FOLLOW UP TESTING OF ANTITHROMBIN III LEVELS.   Lactate dehydrogenase     Status: None   Collection Time: 03/13/16  1:35 AM  Result Value Ref Range   LDH 179 98 - 192 U/L  Protime-INR     Status: Abnormal   Collection Time: 03/13/16  1:35 AM  Result Value Ref Range   Prothrombin Time 15.4 (H) 11.4 - 15.2 seconds   INR 1.22   CBC     Status: Abnormal   Collection Time: 03/13/16  1:35 AM  Result Value Ref Range   WBC 8.4 4.0 - 10.5 K/uL   RBC 4.70 4.22 - 5.81 MIL/uL   Hemoglobin 13.3 13.0 - 17.0 g/dL   HCT 40.0 39.0 - 52.0 %   MCV 85.1 78.0 - 100.0 fL   MCH 28.3 26.0 - 34.0 pg   MCHC 33.3 30.0 - 36.0 g/dL   RDW 15.4 11.5 - 15.5 %   Platelets 148 (L) 150 - 400 K/uL  Basic metabolic panel     Status: Abnormal   Collection Time: 03/13/16  1:35 AM  Result Value Ref Range   Sodium 134 (L) 135 - 145 mmol/L   Potassium 3.7 3.5 - 5.1 mmol/L   Chloride 101 101 - 111 mmol/L   CO2 26 22 - 32 mmol/L   Glucose, Bld 112 (H) 65 - 99 mg/dL   BUN 9 6 - 20 mg/dL   Creatinine, Ser 1.13 0.61 - 1.24 mg/dL   Calcium 8.6 (L) 8.9 - 10.3 mg/dL   GFR calc non Af Amer >60 >60 mL/min   GFR calc Af Amer >60 >60 mL/min    Comment: (NOTE) The eGFR has been calculated using the CKD EPI equation. This calculation has not been validated in all clinical situations. eGFR's persistently <60 mL/min signify possible Chronic Kidney Disease.    Anion gap 7  5 - 15  Heparin level (unfractionated)     Status: Abnormal   Collection Time: 03/13/16  8:04 AM  Result Value Ref Range   Heparin Unfractionated 0.25 (L) 0.30 - 0.70 IU/mL    Comment:        IF HEPARIN RESULTS ARE BELOW EXPECTED VALUES, AND PATIENT DOSAGE HAS BEEN CONFIRMED, SUGGEST FOLLOW UP TESTING OF ANTITHROMBIN III LEVELS.   Heparin level (unfractionated)     Status: Abnormal   Collection Time: 03/13/16 10:36 PM  Result Value Ref Range   Heparin Unfractionated 0.20 (L) 0.30 - 0.70 IU/mL    Comment:        IF HEPARIN RESULTS ARE BELOW EXPECTED VALUES, AND PATIENT DOSAGE HAS BEEN CONFIRMED, SUGGEST FOLLOW UP TESTING OF ANTITHROMBIN III LEVELS.   Lactate dehydrogenase     Status: None   Collection Time: 03/14/16  2:02 AM  Result Value Ref Range   LDH 192 98 - 192 U/L  Protime-INR     Status: Abnormal   Collection Time: 03/14/16  2:02 AM  Result Value Ref Range   Prothrombin Time 15.4 (H) 11.4 - 15.2 seconds   INR 1.21   CBC     Status: Abnormal   Collection Time: 03/14/16  2:02 AM  Result Value Ref Range   WBC 8.9 4.0 - 10.5 K/uL   RBC 4.63 4.22 - 5.81 MIL/uL   Hemoglobin 13.1 13.0 - 17.0 g/dL   HCT 40.0 39.0 - 52.0 %   MCV 86.4 78.0 - 100.0 fL   MCH 28.3 26.0 - 34.0 pg   MCHC 32.8 30.0 - 36.0 g/dL   RDW 15.4 11.5 - 15.5 %   Platelets 149 (L) 150 - 400 K/uL  Comprehensive metabolic panel     Status: Abnormal   Collection Time: 03/14/16  2:02 AM  Result Value Ref Range   Sodium 135 135 - 145 mmol/L   Potassium 3.9 3.5 - 5.1 mmol/L   Chloride 102 101 - 111 mmol/L   CO2 22 22 - 32 mmol/L   Glucose, Bld 95 65 - 99 mg/dL   BUN 12 6 - 20 mg/dL  Creatinine, Ser 1.21 0.61 - 1.24 mg/dL   Calcium 8.8 (L) 8.9 - 10.3 mg/dL   Total Protein 6.5 6.5 - 8.1 g/dL   Albumin 3.6 3.5 - 5.0 g/dL   AST 15 15 - 41 U/L   ALT 13 (L) 17 - 63 U/L   Alkaline Phosphatase 79 38 - 126 U/L   Total Bilirubin 0.6 0.3 - 1.2 mg/dL   GFR calc non Af Amer >60 >60 mL/min   GFR calc Af Amer  >60 >60 mL/min    Comment: (NOTE) The eGFR has been calculated using the CKD EPI equation. This calculation has not been validated in all clinical situations. eGFR's persistently <60 mL/min signify possible Chronic Kidney Disease.    Anion gap 11 5 - 15  Heparin level (unfractionated)     Status: Abnormal   Collection Time: 03/14/16  2:02 AM  Result Value Ref Range   Heparin Unfractionated 0.17 (L) 0.30 - 0.70 IU/mL    Comment:        IF HEPARIN RESULTS ARE BELOW EXPECTED VALUES, AND PATIENT DOSAGE HAS BEEN CONFIRMED, SUGGEST FOLLOW UP TESTING OF ANTITHROMBIN III LEVELS.   Lactate dehydrogenase     Status: None   Collection Time: 03/15/16  3:53 AM  Result Value Ref Range   LDH 174 98 - 192 U/L  CBC     Status: Abnormal   Collection Time: 03/15/16  3:53 AM  Result Value Ref Range   WBC 7.9 4.0 - 10.5 K/uL   RBC 4.46 4.22 - 5.81 MIL/uL   Hemoglobin 12.8 (L) 13.0 - 17.0 g/dL   HCT 38.5 (L) 39.0 - 52.0 %   MCV 86.3 78.0 - 100.0 fL   MCH 28.7 26.0 - 34.0 pg   MCHC 33.2 30.0 - 36.0 g/dL   RDW 15.6 (H) 11.5 - 15.5 %   Platelets 145 (L) 150 - 400 K/uL  Basic metabolic panel     Status: Abnormal   Collection Time: 03/15/16  3:53 AM  Result Value Ref Range   Sodium 136 135 - 145 mmol/L   Potassium 4.0 3.5 - 5.1 mmol/L   Chloride 105 101 - 111 mmol/L   CO2 24 22 - 32 mmol/L   Glucose, Bld 91 65 - 99 mg/dL   BUN 10 6 - 20 mg/dL   Creatinine, Ser 1.02 0.61 - 1.24 mg/dL   Calcium 8.8 (L) 8.9 - 10.3 mg/dL   GFR calc non Af Amer >60 >60 mL/min   GFR calc Af Amer >60 >60 mL/min    Comment: (NOTE) The eGFR has been calculated using the CKD EPI equation. This calculation has not been validated in all clinical situations. eGFR's persistently <60 mL/min signify possible Chronic Kidney Disease.    Anion gap 7 5 - 15  Heparin level (unfractionated)     Status: Abnormal   Collection Time: 03/15/16  4:00 AM  Result Value Ref Range   Heparin Unfractionated 0.13 (L) 0.30 - 0.70 IU/mL      Comment:        IF HEPARIN RESULTS ARE BELOW EXPECTED VALUES, AND PATIENT DOSAGE HAS BEEN CONFIRMED, SUGGEST FOLLOW UP TESTING OF ANTITHROMBIN III LEVELS.   Protime-INR     Status: Abnormal   Collection Time: 03/15/16  4:00 AM  Result Value Ref Range   Prothrombin Time 15.4 (H) 11.4 - 15.2 seconds   INR 1.22   Lactate dehydrogenase     Status: None   Collection Time: 03/16/16  2:09 AM  Result Value  Ref Range   LDH 179 98 - 192 U/L  CBC     Status: Abnormal   Collection Time: 03/16/16  2:09 AM  Result Value Ref Range   WBC 7.9 4.0 - 10.5 K/uL   RBC 4.37 4.22 - 5.81 MIL/uL   Hemoglobin 12.4 (L) 13.0 - 17.0 g/dL   HCT 37.8 (L) 39.0 - 52.0 %   MCV 86.5 78.0 - 100.0 fL   MCH 28.4 26.0 - 34.0 pg   MCHC 32.8 30.0 - 36.0 g/dL   RDW 15.8 (H) 11.5 - 15.5 %   Platelets 137 (L) 150 - 400 K/uL  Basic metabolic panel     Status: Abnormal   Collection Time: 03/16/16  2:09 AM  Result Value Ref Range   Sodium 137 135 - 145 mmol/L   Potassium 4.3 3.5 - 5.1 mmol/L   Chloride 105 101 - 111 mmol/L   CO2 25 22 - 32 mmol/L   Glucose, Bld 88 65 - 99 mg/dL   BUN 10 6 - 20 mg/dL   Creatinine, Ser 1.03 0.61 - 1.24 mg/dL   Calcium 8.8 (L) 8.9 - 10.3 mg/dL   GFR calc non Af Amer >60 >60 mL/min   GFR calc Af Amer >60 >60 mL/min    Comment: (NOTE) The eGFR has been calculated using the CKD EPI equation. This calculation has not been validated in all clinical situations. eGFR's persistently <60 mL/min signify possible Chronic Kidney Disease.    Anion gap 7 5 - 15  Heparin level (unfractionated)     Status: Abnormal   Collection Time: 03/16/16  2:09 AM  Result Value Ref Range   Heparin Unfractionated <0.10 (L) 0.30 - 0.70 IU/mL    Comment:        IF HEPARIN RESULTS ARE BELOW EXPECTED VALUES, AND PATIENT DOSAGE HAS BEEN CONFIRMED, SUGGEST FOLLOW UP TESTING OF ANTITHROMBIN III LEVELS.   Protime-INR     Status: Abnormal   Collection Time: 03/16/16  2:09 AM  Result Value Ref Range    Prothrombin Time 16.2 (H) 11.4 - 15.2 seconds   INR 1.29   Lactate dehydrogenase     Status: None   Collection Time: 03/17/16  4:20 AM  Result Value Ref Range   LDH 168 98 - 192 U/L  Protime-INR     Status: Abnormal   Collection Time: 03/17/16  4:20 AM  Result Value Ref Range   Prothrombin Time 16.9 (H) 11.4 - 15.2 seconds   INR 1.36   CBC     Status: Abnormal   Collection Time: 03/17/16  4:20 AM  Result Value Ref Range   WBC 8.1 4.0 - 10.5 K/uL   RBC 4.55 4.22 - 5.81 MIL/uL   Hemoglobin 12.7 (L) 13.0 - 17.0 g/dL   HCT 39.7 39.0 - 52.0 %   MCV 87.3 78.0 - 100.0 fL   MCH 27.9 26.0 - 34.0 pg   MCHC 32.0 30.0 - 36.0 g/dL   RDW 15.8 (H) 11.5 - 15.5 %   Platelets 138 (L) 150 - 400 K/uL  Basic metabolic panel     Status: None   Collection Time: 03/17/16  4:20 AM  Result Value Ref Range   Sodium 138 135 - 145 mmol/L   Potassium 4.0 3.5 - 5.1 mmol/L   Chloride 105 101 - 111 mmol/L   CO2 25 22 - 32 mmol/L   Glucose, Bld 93 65 - 99 mg/dL   BUN 12 6 - 20 mg/dL   Creatinine, Ser  0.95 0.61 - 1.24 mg/dL   Calcium 8.9 8.9 - 10.3 mg/dL   GFR calc non Af Amer >60 >60 mL/min   GFR calc Af Amer >60 >60 mL/min    Comment: (NOTE) The eGFR has been calculated using the CKD EPI equation. This calculation has not been validated in all clinical situations. eGFR's persistently <60 mL/min signify possible Chronic Kidney Disease.    Anion gap 8 5 - 15  Heparin level (unfractionated)     Status: Abnormal   Collection Time: 03/17/16  4:20 AM  Result Value Ref Range   Heparin Unfractionated 0.17 (L) 0.30 - 0.70 IU/mL    Comment:        IF HEPARIN RESULTS ARE BELOW EXPECTED VALUES, AND PATIENT DOSAGE HAS BEEN CONFIRMED, SUGGEST FOLLOW UP TESTING OF ANTITHROMBIN III LEVELS.   Heparin level (unfractionated)     Status: Abnormal   Collection Time: 03/18/16  3:57 AM  Result Value Ref Range   Heparin Unfractionated 0.22 (L) 0.30 - 0.70 IU/mL    Comment:        IF HEPARIN RESULTS ARE  BELOW EXPECTED VALUES, AND PATIENT DOSAGE HAS BEEN CONFIRMED, SUGGEST FOLLOW UP TESTING OF ANTITHROMBIN III LEVELS.   CBC     Status: Abnormal   Collection Time: 03/18/16  3:57 AM  Result Value Ref Range   WBC 7.3 4.0 - 10.5 K/uL   RBC 4.58 4.22 - 5.81 MIL/uL   Hemoglobin 13.1 13.0 - 17.0 g/dL   HCT 39.5 39.0 - 52.0 %   MCV 86.2 78.0 - 100.0 fL   MCH 28.6 26.0 - 34.0 pg   MCHC 33.2 30.0 - 36.0 g/dL   RDW 15.8 (H) 11.5 - 15.5 %   Platelets 135 (L) 150 - 400 K/uL  Lactate dehydrogenase     Status: None   Collection Time: 03/18/16  3:57 AM  Result Value Ref Range   LDH 171 98 - 192 U/L  Protime-INR     Status: Abnormal   Collection Time: 03/18/16  3:57 AM  Result Value Ref Range   Prothrombin Time 18.0 (H) 11.4 - 15.2 seconds   INR 1.47   Lactate dehydrogenase     Status: None   Collection Time: 03/19/16  5:00 AM  Result Value Ref Range   LDH 179 98 - 192 U/L  Protime-INR     Status: Abnormal   Collection Time: 03/19/16  5:00 AM  Result Value Ref Range   Prothrombin Time 19.8 (H) 11.4 - 15.2 seconds   INR 1.66   CBC     Status: Abnormal   Collection Time: 03/19/16  5:00 AM  Result Value Ref Range   WBC 8.3 4.0 - 10.5 K/uL   RBC 4.71 4.22 - 5.81 MIL/uL   Hemoglobin 13.2 13.0 - 17.0 g/dL   HCT 41.1 39.0 - 52.0 %   MCV 87.3 78.0 - 100.0 fL   MCH 28.0 26.0 - 34.0 pg   MCHC 32.1 30.0 - 36.0 g/dL   RDW 15.9 (H) 11.5 - 15.5 %   Platelets 147 (L) 150 - 400 K/uL  Heparin level (unfractionated)     Status: Abnormal   Collection Time: 03/19/16  5:00 AM  Result Value Ref Range   Heparin Unfractionated 0.22 (L) 0.30 - 0.70 IU/mL    Comment:        IF HEPARIN RESULTS ARE BELOW EXPECTED VALUES, AND PATIENT DOSAGE HAS BEEN CONFIRMED, SUGGEST FOLLOW UP TESTING OF ANTITHROMBIN III LEVELS.   Lactate dehydrogenase  Status: Abnormal   Collection Time: 03/20/16  4:13 AM  Result Value Ref Range   LDH 195 (H) 98 - 192 U/L  Protime-INR     Status: Abnormal   Collection Time:  03/20/16  4:13 AM  Result Value Ref Range   Prothrombin Time 22.0 (H) 11.4 - 15.2 seconds   INR 1.90   CBC     Status: Abnormal   Collection Time: 03/20/16  4:13 AM  Result Value Ref Range   WBC 7.7 4.0 - 10.5 K/uL   RBC 4.82 4.22 - 5.81 MIL/uL   Hemoglobin 13.5 13.0 - 17.0 g/dL   HCT 41.4 39.0 - 52.0 %   MCV 85.9 78.0 - 100.0 fL   MCH 28.0 26.0 - 34.0 pg   MCHC 32.6 30.0 - 36.0 g/dL   RDW 15.7 (H) 11.5 - 15.5 %   Platelets 161 150 - 400 K/uL  Heparin level (unfractionated)     Status: Abnormal   Collection Time: 03/20/16  4:13 AM  Result Value Ref Range   Heparin Unfractionated 0.25 (L) 0.30 - 0.70 IU/mL    Comment:        IF HEPARIN RESULTS ARE BELOW EXPECTED VALUES, AND PATIENT DOSAGE HAS BEEN CONFIRMED, SUGGEST FOLLOW UP TESTING OF ANTITHROMBIN III LEVELS.   Basic metabolic panel     Status: Abnormal   Collection Time: 03/20/16  4:13 AM  Result Value Ref Range   Sodium 136 135 - 145 mmol/L   Potassium 4.2 3.5 - 5.1 mmol/L   Chloride 103 101 - 111 mmol/L   CO2 20 (L) 22 - 32 mmol/L   Glucose, Bld 85 65 - 99 mg/dL   BUN 13 6 - 20 mg/dL   Creatinine, Ser 1.02 0.61 - 1.24 mg/dL   Calcium 9.2 8.9 - 10.3 mg/dL   GFR calc non Af Amer >60 >60 mL/min   GFR calc Af Amer >60 >60 mL/min    Comment: (NOTE) The eGFR has been calculated using the CKD EPI equation. This calculation has not been validated in all clinical situations. eGFR's persistently <60 mL/min signify possible Chronic Kidney Disease.    Anion gap 13 5 - 15  POCT INR     Status: None   Collection Time: 03/23/16 12:00 AM  Result Value Ref Range   INR 1.6   CBC w/Diff/Platelet     Status: None   Collection Time: 03/24/16  1:48 PM  Result Value Ref Range   WBC 7.3 4.0 - 10.5 K/uL   RBC 4.83 4.22 - 5.81 MIL/uL   Hemoglobin 13.7 13.0 - 17.0 g/dL   HCT 41.8 39.0 - 52.0 %   MCV 86.5 78.0 - 100.0 fL   MCH 28.4 26.0 - 34.0 pg   MCHC 32.8 30.0 - 36.0 g/dL   RDW 15.0 11.5 - 15.5 %   Platelets 177 150 - 400  K/uL   Neutrophils Relative % 62 %   Neutro Abs 4.5 1.7 - 7.7 K/uL   Lymphocytes Relative 27 %   Lymphs Abs 2.0 0.7 - 4.0 K/uL   Monocytes Relative 9 %   Monocytes Absolute 0.6 0.1 - 1.0 K/uL   Eosinophils Relative 2 %   Eosinophils Absolute 0.1 0.0 - 0.7 K/uL   Basophils Relative 0 %   Basophils Absolute 0.0 0.0 - 0.1 K/uL  Lactate Dehydrogenase (LDH)     Status: Abnormal   Collection Time: 03/24/16  1:48 PM  Result Value Ref Range   LDH 230 (H) 98 -  192 U/L  INR/PT     Status: Abnormal   Collection Time: 03/24/16  1:48 PM  Result Value Ref Range   Prothrombin Time 19.6 (H) 11.4 - 15.2 seconds   INR 1.63   B Nat Peptide     Status: None   Collection Time: 03/24/16  1:48 PM  Result Value Ref Range   B Natriuretic Peptide 37.6 0.0 - 100.0 pg/mL  Comprehensive Metabolic Panel (CMET)     Status: None   Collection Time: 03/24/16  1:51 PM  Result Value Ref Range   Sodium 139 135 - 145 mmol/L   Potassium 3.9 3.5 - 5.1 mmol/L    Comment: SLIGHT HEMOLYSIS   Chloride 107 101 - 111 mmol/L   CO2 25 22 - 32 mmol/L   Glucose, Bld 96 65 - 99 mg/dL   BUN 10 6 - 20 mg/dL   Creatinine, Ser 1.14 0.61 - 1.24 mg/dL   Calcium 9.1 8.9 - 10.3 mg/dL   Total Protein 7.1 6.5 - 8.1 g/dL   Albumin 4.1 3.5 - 5.0 g/dL   AST 25 15 - 41 U/L   ALT 20 17 - 63 U/L   Alkaline Phosphatase 90 38 - 126 U/L   Total Bilirubin 0.8 0.3 - 1.2 mg/dL   GFR calc non Af Amer >60 >60 mL/min   GFR calc Af Amer >60 >60 mL/min    Comment: (NOTE) The eGFR has been calculated using the CKD EPI equation. This calculation has not been validated in all clinical situations. eGFR's persistently <60 mL/min signify possible Chronic Kidney Disease.    Anion gap 7 5 - 15  Protime-INR     Status: Abnormal   Collection Time: 03/25/16  2:45 AM  Result Value Ref Range   Prothrombin Time 21.2 (H) 11.4 - 15.2 seconds   INR 1.80   CBC     Status: Abnormal   Collection Time: 03/25/16  2:45 AM  Result Value Ref Range   WBC 7.8  4.0 - 10.5 K/uL   RBC 4.43 4.22 - 5.81 MIL/uL   Hemoglobin 12.6 (L) 13.0 - 17.0 g/dL   HCT 38.4 (L) 39.0 - 52.0 %   MCV 86.7 78.0 - 100.0 fL   MCH 28.4 26.0 - 34.0 pg   MCHC 32.8 30.0 - 36.0 g/dL   RDW 15.1 11.5 - 15.5 %   Platelets 173 150 - 400 K/uL  CBC     Status: Abnormal   Collection Time: 03/26/16 11:20 AM  Result Value Ref Range   WBC 7.2 4.0 - 10.5 K/uL   RBC 4.28 4.22 - 5.81 MIL/uL   Hemoglobin 12.3 (L) 13.0 - 17.0 g/dL   HCT 37.0 (L) 39.0 - 52.0 %   MCV 86.4 78.0 - 100.0 fL   MCH 28.7 26.0 - 34.0 pg   MCHC 33.2 30.0 - 36.0 g/dL   RDW 15.2 11.5 - 15.5 %   Platelets 166 150 - 400 K/uL  INR/PT     Status: Abnormal   Collection Time: 03/26/16 11:20 AM  Result Value Ref Range   Prothrombin Time 21.4 (H) 11.4 - 15.2 seconds   INR 1.83   Lactate Dehydrogenase (LDH)     Status: Abnormal   Collection Time: 03/26/16 11:20 AM  Result Value Ref Range   LDH 205 (H) 98 - 192 U/L  Protime-INR     Status: None   Collection Time: 03/30/16  9:23 AM  Result Value Ref Range   Prothrombin Time 13.8 11.4 -  15.2 seconds   INR 1.06   Lactate dehydrogenase     Status: None   Collection Time: 03/30/16  9:23 AM  Result Value Ref Range   LDH 176 98 - 192 U/L  Protime-INR     Status: None   Collection Time: 04/02/16  9:25 AM  Result Value Ref Range   Prothrombin Time 15.2 11.4 - 15.2 seconds   INR 1.19   Lactate dehydrogenase     Status: None   Collection Time: 04/02/16  9:25 AM  Result Value Ref Range   LDH 186 98 - 192 U/L  Lactate dehydrogenase     Status: Abnormal   Collection Time: 04/06/16  9:29 AM  Result Value Ref Range   LDH 225 (H) 98 - 192 U/L  Protime-INR     Status: Abnormal   Collection Time: 04/06/16  9:29 AM  Result Value Ref Range   Prothrombin Time 25.7 (H) 11.4 - 15.2 seconds   INR 2.30   CBC     Status: None   Collection Time: 04/13/16 10:39 AM  Result Value Ref Range   WBC 7.1 4.0 - 10.5 K/uL   RBC 4.56 4.22 - 5.81 MIL/uL   Hemoglobin 13.0 13.0 - 17.0  g/dL   HCT 39.2 39.0 - 52.0 %   MCV 86.0 78.0 - 100.0 fL   MCH 28.5 26.0 - 34.0 pg   MCHC 33.2 30.0 - 36.0 g/dL   RDW 14.6 11.5 - 15.5 %   Platelets 189 150 - 400 K/uL  Lactate Dehydrogenase (LDH)     Status: Abnormal   Collection Time: 04/13/16 10:39 AM  Result Value Ref Range   LDH 225 (H) 98 - 192 U/L  INR/PT     Status: Abnormal   Collection Time: 04/13/16 10:39 AM  Result Value Ref Range   Prothrombin Time 24.2 (H) 11.4 - 15.2 seconds   INR 2.13     ASSESSMENT AND PLAN:   1) Chronic systolic HF, s/p HMII LVAD implant 05/2012.  -  NYHA II. - Listed 1A at [facility]. Also with visit to [facility] for dual listing.  - He is not on a BB. Intolerant BB. Volume status stable.   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 I changed the driveline dressing today using sterile technique. Reapplied 1/2 gram A Cell to driveline wound. I did not probe the wound to allow for ongoing incorporation of A Cell. Wound does appear smaller.  Plan to continue ceftriaxone 2 grams every 12 hours per ID. Plan for ceftriaxone at least 6 wks. PICC site ok.  - He will follow up with Dr Johnnye Sima the end of January.   3) HTN-  - Today he is hypotensive and this is likely because he was taking a higher dose of amlodipine than prescribed. I have asked him to cut amlodipine to 2.5 mg daily. Cut back hydralazine to 25 mg twice a day.  Tonight he will hold hydralazine and entresto. Stable today. Continue current regimen.    improved with Entresto 97/103 bid.  Repeat MAP next week.  3) Hyperthyroidism - Amio induced. Follows with Dr. Forde Dandy. Amio stopped. Recent TSH 02/2016 1.422   Plan to repeat  4) NSVT/VT  - Off amio. On mexilitene. - ICD now at EOL. We have decided to not replace given that risk of infection felt to be higher than benefit or replacing as VT usually well tolerated in setting of VAD support Reviewed 04/14/16 continue  current plan.  5)  PVCs - Resolved.   6) Anticoagulation with coumadin  - Continue coumadin and ASA 325 mg for LVAD. INR therapeutic. Todays INR is 2.13. Stable.  7) LVAD  - Stable. Parameters stable. Reviewed during OV.   Follow up next week for dressing change and reapplication of ACell  Amy Clegg NP-C  2:39 PM

## 2016-04-20 ENCOUNTER — Ambulatory Visit (HOSPITAL_COMMUNITY)
Admission: RE | Admit: 2016-04-20 | Discharge: 2016-04-20 | Disposition: A | Discharge status: Home / Self Care | Payer: Medicaid Other | Source: Ambulatory Visit | Attending: Cardiology | Admitting: Cardiology

## 2016-04-20 VITALS — BP 122/77 | HR 73 | Ht 74.0 in | Wt 250.0 lb

## 2016-04-20 DIAGNOSIS — Z95811 Presence of heart assist device: Secondary | ICD-10-CM | POA: Diagnosis not present

## 2016-04-20 DIAGNOSIS — Z7901 Long term (current) use of anticoagulants: Secondary | ICD-10-CM | POA: Diagnosis not present

## 2016-04-20 DIAGNOSIS — Z79899 Other long term (current) drug therapy: Secondary | ICD-10-CM | POA: Insufficient documentation

## 2016-04-20 DIAGNOSIS — I10 Essential (primary) hypertension: Secondary | ICD-10-CM | POA: Diagnosis not present

## 2016-04-20 DIAGNOSIS — I509 Heart failure, unspecified: Secondary | ICD-10-CM | POA: Insufficient documentation

## 2016-04-20 DIAGNOSIS — T829XXD Unspecified complication of cardiac and vascular prosthetic device, implant and graft, subsequent encounter: Secondary | ICD-10-CM | POA: Diagnosis not present

## 2016-04-20 DIAGNOSIS — I428 Other cardiomyopathies: Secondary | ICD-10-CM | POA: Insufficient documentation

## 2016-04-20 DIAGNOSIS — A498 Other bacterial infections of unspecified site: Secondary | ICD-10-CM

## 2016-04-20 MED ORDER — AMLODIPINE BESYLATE 2.5 MG PO TABS: 2.5000 mg | ORAL_TABLET | Freq: Every day | ORAL | 0 refills | Status: DC

## 2016-04-20 NOTE — Progress Notes (Signed)
Patient presents for follow up in VAD Clinic today. Reports no problems with VAD equipment. FU with ID scheduled 05/04/16  Vital Signs:  Doppler Pressure:104 Automatc BP: 122/77 (105) HR: 73 SPO2: 100 %  Body mass index is 32.1 kg/m.   VAD Indication: Bridge to Transplant- Evaluation completed at [facility] with IB listing 05/30/2013; dual listed with [facility] 12/04/2014.              1A [facility] & 1B [facility].    VAD interrogation & Equipment Management: Speed: 9600 Flow: 5.5 Power: 5.5 w PI: 7.3  Alarms: no clinical alarms Events: 5 - 10 daily   Fixed speed: 9600 Low speed limit: 9000  Primary Controller: Replace back up battery in 23months. Back up controller: Replace back up battery in 64months.  Annual Equipment Maintenance on UBC/PM was performed on 06/19/2015.  I reviewed the LVAD parameters from todayand compared the results to the patient's prior recorded data with Amy Clegg, NP.LVAD interrogation was NEGATIVEfor significant power changes, NEGATIVEfor clinicalalarms and STABLEfor PI events/speed drops. No programming changes were madeand pump is functioning within specified parameters. Pt is performing daily controller and system monitor self tests along with completing weekly and monthly maintenance for LVAD equipment.  LVAD equipment check completed and is in good working order. Back-up equipment present. Charged back up battery and performed self-test on equipment.   Exit Site Care: Drive line changed per [employer] today in clinic with Acell application.    Significant Events on VAD Support:  08/26/15 >> Serratia marcescens (rare) >> Cipro 500 mg bid x 7 days  09/29/15 >> Serratia marcescens (abundant) >> Cipro 500 mg BID x14d 11/20/15 >> Serratia marcescens (few) Admitted IV ceftriaxone >> cipro 500 mg twice daily x 30 days 02/03/16 >> Serratia species >> Cipro 500 mg BID x 14d; referral to ID 02/13/16 03/11/2016 >> driveline debridement, IV  antibiotics   Device:Medtronic Dual  Therapies: on Last check: 08/05/2014 (assessed Optivol 02/05/16)  BP &Labs:  MAP 104 - Doppler is reflecting MAP  Rexene Alberts, RN VAD Coordinator   Office: [phone] 24/7 Emergency VAD Pager: [phone]

## 2016-04-20 NOTE — Progress Notes (Signed)
LVAD CLINIC NOTE  Patient ID: Robert [name], male   DOB: [birth year], 61 y.o.   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 status post dual chamber Medtronic ICD implanted April 2011. Cath 2011 showed normal coronaries. He also has a history of VTach .   Amio recently stopped due to thyrotoxicity. Placed on prednisone and tapazole. Had VT off amio and mexilitene started.  Had [place] on 8/17 as part of dual listing process at [facility]. Normal filling pressures and cardiac output.   Drive Line Infeciton  08/26/15 >>Serratia marcescens (rare) >>Cipro 500 mg bid x 7 days  09/29/15 >> Serratia marcescens (abundant) >>Cipro 500 mg BID x14d 11/20/15 >>Serratia marcescens (few) Admitted IV ceftriaxone >>cipro 500 mg twice daily x 30 days 02/03/16 >> Serratia species >> Cipro 500 mg BID x 14d; referral to ID  03/12/2016 Surgical Debridement with VAC placement. Full thickness 5x8x6 cm. 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 Plan to continued ced on cefepime 1 gram IV every 8 hours  03/16/2016 PICC line placed for IV antibiotics.  03/17/2016 VAC reapplied with 1 gram of A Cell applied. 03/25/2016 VAC removed Damp saline dressing applied.  04/02/2016 Acell applied   Admitted to [facility] on 12/5 for heparin bridge and surgical debridement of persistent drive line infection. VAC applied with A cell. VAC maintained seal at 75 mm hg continuous therapy. Plan for ceftriaxone for 6 wks until 04/23/16. Will also need VAC changes weekly with ACell. [facility] following for home antibiotics.   He returns for LVAD follow up. Last visit he was hypotensive so amlodipine was stopped and hydrlazine was cut back. Overall feeling good. Denies SOB/PND/Orthopnea. No fever or chills. No BRBPR. Dressing remains intact. has  follow up with ID, Dr Johnnye Sima January 30th.  Continue [facility] for home antibiotics. No PICC issues. No RUE  PICC issues. Denies R upper arm pain.    Recent RHC on 11/06/15 as part of Transplant process RA = 6 RV =  18/3/6 PA = 19/7 (12) PCW = 8 Fick cardiac output/index = 6.1/2.6 PVR = .0.7 WU Ao sat = 98% PA sat = 70%, 69%:   LVAD interrogation reveals:  Speed: 9600 Flow: 5.5 Power: 5.5 w PI: 7.3 Alarms: no clinical alarms Events: 5 - 10 daily  Fixed speed: 9600 Low speed limit: 9000  I reviewed the LVAD parameters from today, and compared the results to the patient's prior recorded data.  No programming changes were made.  The LVAD is functioning within specified parameters.  The patient performs LVAD self-test daily.  LVAD interrogation was negative for any significant power changes, alarms or PI events/speed drops.  LVAD equipment check completed and is in good working order.  Back-up equipment present.   LVAD education done on emergency procedures and precautions and reviewed exit site care.    Past Medical History:  Diagnosis Date  . AICD (automatic cardioverter/defibrillator) present 2011  . Bronchitis   . Congestive heart failure ([place]) 2011  . Hypertension 2000  . LVAD (left ventricular assist device) present ([place]) 2014  . Pneumonia   . Pulmonary hypertension     Current Outpatient Prescriptions  Medication Sig Dispense Refill  . allopurinol (ZYLOPRIM) 300 MG tablet Take 1 tablet (300 mg total) by mouth daily. 30 tablet 6  .  aspirin EC 325 MG tablet Take 1 tablet (325 mg total) by mouth daily. 30 tablet 6  . cefTRIAXone (ROCEPHIN) IVPB Inject 2 g into the vein every 12 (twelve) hours. Indication:  LVAD infection Last Day of Therapy:  04/23/16 Labs - Once weekly:  CBC/D and BMP, Labs - Every other week:  ESR and CRP 70 Units 0  . furosemide (LASIX) 40 MG tablet Take 1 tablet (40 mg total) by mouth daily as needed. Take as directed by clinic 30 tablet 3  .  hydrALAZINE (APRESOLINE) 50 MG tablet Take 0.5 tablets (25 mg total) by mouth 2 (two) times daily. 30 tablet 6  . LOVENOX 100 MG/ML injection Inject 1 mL (100 mg total) into the skin every 12 (twelve) hours. 10 Syringe 1  . mexiletine (MEXITIL) 150 MG capsule Take 1 capsule (150 mg total) by mouth 2 (two) times daily. 60 capsule 5  . Multiple Vitamin (MULTIVITAMIN) tablet Take 1 tablet by mouth daily.    . pantoprazole (PROTONIX) 40 MG tablet Take 1 tablet (40 mg total) by mouth daily. 30 tablet 3  . sacubitril-valsartan (ENTRESTO) 97-103 MG Take 1 tablet by mouth 2 (two) times daily. 60 tablet 3  . traMADol (ULTRAM) 50 MG tablet Take 1 tablet (50 mg total) by mouth every 8 (eight) hours as needed for moderate pain. 30 tablet 5  . warfarin (COUMADIN) 5 MG tablet Take daily as directed by VAD clinic based on weekly INR results. 30 tablet 3   No current facility-administered medications for this encounter.     Metoprolol and Imdur [isosorbide nitrate]  REVIEW OF SYSTEMS: All systems negative except as listed in HPI, PMH and Problem list.  Doppler Pressure:104 Automatc BP: 122/77 (105)  HR: 73 SPO2: 100 % Wt: Reported 241 pounds.  Ht:  6'2"  Physical Exam: GENERAL: Well appearing, male who presents to clinic.  NAD  HEENT: normal  NECK: Supple, JVP flat;  2+ bilaterally, no bruits.  No lymphadenopathy or thyromegaly appreciated.  CARDIAC:  Mechanical heart sounds with LVAD hum present.  LUNGS:  CTAB ABDOMEN:  Soft, round, nontender, positive bowel sounds x4.     LVAD exit site: Full thickness wound . 100% granulation. No odor. Minimal exudate. Driveline has multiple twists.  EXTREMITIES:  Warm No cyanosis. RUE PICC ok. RUE non tender.  NEUROLOGIC:  A&O x3. Gait steady.  No aphasia.  No dysarthria.  Affect pleasant.     Recent Results (from the past 2160 hour(s))  Protime-INR     Status: Abnormal   Collection Time: 01/28/16 10:18 AM  Result Value Ref Range   Prothrombin Time 27.1  (H) 11.4 - 15.2 seconds   INR 6.04   Basic metabolic panel     Status: None   Collection Time: 02/03/16 12:43 PM  Result Value Ref Range   Sodium 139 135 - 145 mmol/L   Potassium 4.0 3.5 - 5.1 mmol/L   Chloride 106 101 - 111 mmol/L   CO2 26 22 - 32 mmol/L   Glucose, Bld 90 65 - 99 mg/dL   BUN 12 6 - 20 mg/dL   Creatinine, Ser 1.10 0.61 - 1.24 mg/dL   Calcium 9.2 8.9 - 10.3 mg/dL   GFR calc non Af Amer >60 >60 mL/min   GFR calc Af Amer >60 >60 mL/min    Comment: (NOTE) The eGFR has been calculated using the CKD EPI equation. This calculation has not been validated in all clinical situations. eGFR's persistently <60 mL/min signify possible Chronic  Kidney Disease.    Anion gap 7 5 - 15  CBC     Status: None   Collection Time: 02/03/16 12:43 PM  Result Value Ref Range   WBC 7.0 4.0 - 10.5 K/uL   RBC 5.03 4.22 - 5.81 MIL/uL   Hemoglobin 14.3 13.0 - 17.0 g/dL   HCT 43.5 39.0 - 52.0 %   MCV 86.5 78.0 - 100.0 fL   MCH 28.4 26.0 - 34.0 pg   MCHC 32.9 30.0 - 36.0 g/dL   RDW 15.2 11.5 - 15.5 %   Platelets 172 150 - 400 K/uL  B Nat Peptide     Status: None   Collection Time: 02/03/16 12:43 PM  Result Value Ref Range   B Natriuretic Peptide 66.1 0.0 - 100.0 pg/mL  INR/PT     Status: Abnormal   Collection Time: 02/03/16 12:43 PM  Result Value Ref Range   Prothrombin Time 24.5 (H) 11.4 - 15.2 seconds   INR 2.17   Aerobic Culture (superficial specimen)     Status: None   Collection Time: 02/03/16  3:32 PM  Result Value Ref Range   Specimen Description WOUND ABDOMEN    Special Requests DRIVE LINE SITE    Gram Stain      FEW WBC PRESENT, PREDOMINANTLY PMN NO ORGANISMS SEEN    Culture RARE SERRATIA SPECIES    Report Status 02/06/2016 FINAL    Organism ID, Bacteria SERRATIA SPECIES       Susceptibility   Serratia species - MIC*    CEFAZOLIN >=64 RESISTANT Resistant     CEFEPIME <=1 SENSITIVE Sensitive     CEFTAZIDIME <=1 SENSITIVE Sensitive     CEFTRIAXONE <=1 SENSITIVE  Sensitive     CIPROFLOXACIN 1 SENSITIVE Sensitive     GENTAMICIN <=1 SENSITIVE Sensitive     TRIMETH/SULFA 40 SENSITIVE Sensitive     * RARE SERRATIA SPECIES  TSH     Status: None   Collection Time: 02/05/16  3:36 PM  Result Value Ref Range   TSH 1.422 0.350 - 4.500 uIU/mL    Comment: Performed by a 3rd Generation assay with a functional sensitivity of <=0.01 uIU/mL.  T4, free     Status: None   Collection Time: 02/05/16  3:36 PM  Result Value Ref Range   Free T4 0.95 0.61 - 1.12 ng/dL    Comment: (NOTE) Biotin ingestion may interfere with free T4 tests. If the results are inconsistent with the TSH level, previous test results, or the clinical presentation, then consider biotin interference. If needed, order repeat testing after stopping biotin.   T3, free     Status: None   Collection Time: 02/05/16  3:36 PM  Result Value Ref Range   T3, Free 2.6 2.0 - 4.4 pg/mL    Comment: (NOTE) Performed At: [facility] [place], [place] [id number] Lindon Romp MD [id number]   Lactate dehydrogenase     Status: Abnormal   Collection Time: 02/13/16 10:49 AM  Result Value Ref Range   LDH 209 (H) 98 - 192 U/L  Protime-INR     Status: Abnormal   Collection Time: 02/13/16 10:49 AM  Result Value Ref Range   Prothrombin Time 20.2 (H) 11.4 - 15.2 seconds   INR 1.70   CBC with Differential     Status: Abnormal   Collection Time: 02/13/16 10:49 AM  Result Value Ref Range   WBC 6.5 4.0 - 10.5 K/uL   RBC 4.70 4.22 - 5.81 MIL/uL  Hemoglobin 13.2 13.0 - 17.0 g/dL   HCT 40.5 39.0 - 52.0 %   MCV 86.2 78.0 - 100.0 fL   MCH 28.1 26.0 - 34.0 pg   MCHC 32.6 30.0 - 36.0 g/dL   RDW 15.0 11.5 - 15.5 %   Platelets 133 (L) 150 - 400 K/uL   Neutrophils Relative % 67 %   Neutro Abs 4.3 1.7 - 7.7 K/uL   Lymphocytes Relative 24 %   Lymphs Abs 1.6 0.7 - 4.0 K/uL   Monocytes Relative 8 %   Monocytes Absolute 0.5 0.1 - 1.0 K/uL   Eosinophils Relative 1 %   Eosinophils  Absolute 0.1 0.0 - 0.7 K/uL   Basophils Relative 0 %   Basophils Absolute 0.0 0.0 - 0.1 K/uL  INR/PT     Status: Abnormal   Collection Time: 02/20/16 10:22 AM  Result Value Ref Range   Prothrombin Time 29.2 (H) 11.4 - 15.2 seconds   INR 2.69   CULTURE, ROUTINE-ABSCESS     Status: None   Collection Time: 02/25/16  3:30 PM  Result Value Ref Range   Culture Moderate SERRATIA MARCESCENS    Gram Stain Moderate    Gram Stain WBC present-both PMN and Mononuclear    Gram Stain No Squamous Epithelial Cells Seen    Gram Stain No Organisms Seen    Organism ID, Bacteria SERRATIA MARCESCENS       Susceptibility   Serratia marcescens -  (no method available)    AMOX/CLAVULANIC  Resistant     CEFAZOLIN >=64 Resistant     CEFTRIAXONE <=1 Sensitive     CEFTAZIDIME <=1 Sensitive     CEFEPIME <=1 Sensitive     GENTAMICIN <=1 Sensitive     TOBRAMYCIN <=1 Sensitive     CIPROFLOXACIN 2 Intermediate     LEVOFLOXACIN 4 Intermediate     TRIMETH/SULFA* <=20 Sensitive      * NR=NOT REPORTABLE,SEE COMMENTORAL therapy:A cefazolin MIC of <32 predicts susceptibility to the oral agents cefaclor,cefdinir,cefpodoxime,cefprozil,cefuroxime,cephalexin,and loracarbef when used for therapy of uncomplicated UTIs due to E.coli,K.pneumomiae,and P.mirabilis. PARENTERAL therapy: A cefazolinMIC of >8 indicates resistance to parenteralcefazolin. An alternate test method must beperformed to confirm susceptibility to parenteralcefazolin.  INR/PT     Status: Abnormal   Collection Time: 02/25/16  3:53 PM  Result Value Ref Range   Prothrombin Time 28.6 (H) 11.4 - 15.2 seconds   INR 2.62   Protime-INR     Status: Abnormal   Collection Time: 03/09/16  6:11 PM  Result Value Ref Range   Prothrombin Time 21.1 (H) 11.4 - 15.2 seconds   INR 1.80   Lactate dehydrogenase     Status: Abnormal   Collection Time: 03/09/16  6:11 PM  Result Value Ref Range   LDH 224 (H) 98 - 192 U/L  MRSA PCR Screening     Status: None   Collection  Time: 03/09/16  6:23 PM  Result Value Ref Range   MRSA by PCR NEGATIVE NEGATIVE    Comment:        The GeneXpert MRSA Assay (FDA approved for NASAL specimens only), is one component of a comprehensive MRSA colonization surveillance program. It is not intended to diagnose MRSA infection nor to guide or monitor treatment for MRSA infections.   Heparin level (unfractionated)     Status: None   Collection Time: 03/10/16  4:32 AM  Result Value Ref Range   Heparin Unfractionated 0.48 0.30 - 0.70 IU/mL    Comment:  IF HEPARIN RESULTS ARE BELOW EXPECTED VALUES, AND PATIENT DOSAGE HAS BEEN CONFIRMED, SUGGEST FOLLOW UP TESTING OF ANTITHROMBIN III LEVELS.   Basic metabolic panel     Status: Abnormal   Collection Time: 03/10/16  4:32 AM  Result Value Ref Range   Sodium 138 135 - 145 mmol/L   Potassium 3.6 3.5 - 5.1 mmol/L   Chloride 105 101 - 111 mmol/L   CO2 27 22 - 32 mmol/L   Glucose, Bld 88 65 - 99 mg/dL   BUN 9 6 - 20 mg/dL   Creatinine, Ser 1.00 0.61 - 1.24 mg/dL   Calcium 8.5 (L) 8.9 - 10.3 mg/dL   GFR calc non Af Amer >60 >60 mL/min   GFR calc Af Amer >60 >60 mL/min    Comment: (NOTE) The eGFR has been calculated using the CKD EPI equation. This calculation has not been validated in all clinical situations. eGFR's persistently <60 mL/min signify possible Chronic Kidney Disease.    Anion gap 6 5 - 15  CBC     Status: Abnormal   Collection Time: 03/10/16  4:32 AM  Result Value Ref Range   WBC 6.4 4.0 - 10.5 K/uL   RBC 4.60 4.22 - 5.81 MIL/uL   Hemoglobin 13.1 13.0 - 17.0 g/dL   HCT 39.1 39.0 - 52.0 %   MCV 85.0 78.0 - 100.0 fL   MCH 28.5 26.0 - 34.0 pg   MCHC 33.5 30.0 - 36.0 g/dL   RDW 15.0 11.5 - 15.5 %   Platelets 146 (L) 150 - 400 K/uL  Lactate dehydrogenase     Status: Abnormal   Collection Time: 03/10/16  4:32 AM  Result Value Ref Range   LDH 194 (H) 98 - 192 U/L  Protime-INR     Status: Abnormal   Collection Time: 03/10/16  4:32 AM  Result Value  Ref Range   Prothrombin Time 19.6 (H) 11.4 - 15.2 seconds   INR 1.63   Heparin level (unfractionated)     Status: Abnormal   Collection Time: 03/11/16  3:23 AM  Result Value Ref Range   Heparin Unfractionated 0.85 (H) 0.30 - 0.70 IU/mL    Comment:        IF HEPARIN RESULTS ARE BELOW EXPECTED VALUES, AND PATIENT DOSAGE HAS BEEN CONFIRMED, SUGGEST FOLLOW UP TESTING OF ANTITHROMBIN III LEVELS.   CBC     Status: None   Collection Time: 03/11/16  3:23 AM  Result Value Ref Range   WBC 7.9 4.0 - 10.5 K/uL   RBC 5.03 4.22 - 5.81 MIL/uL   Hemoglobin 14.2 13.0 - 17.0 g/dL   HCT 42.6 39.0 - 52.0 %   MCV 84.7 78.0 - 100.0 fL   MCH 28.2 26.0 - 34.0 pg   MCHC 33.3 30.0 - 36.0 g/dL   RDW 15.1 11.5 - 15.5 %   Platelets 180 150 - 400 K/uL  Lactate dehydrogenase     Status: Abnormal   Collection Time: 03/11/16  3:23 AM  Result Value Ref Range   LDH 217 (H) 98 - 192 U/L  Protime-INR     Status: Abnormal   Collection Time: 03/11/16  3:23 AM  Result Value Ref Range   Prothrombin Time 16.3 (H) 11.4 - 15.2 seconds   INR 1.30   Heparin level (unfractionated)     Status: None   Collection Time: 03/11/16 10:56 AM  Result Value Ref Range   Heparin Unfractionated 0.62 0.30 - 0.70 IU/mL    Comment:  IF HEPARIN RESULTS ARE BELOW EXPECTED VALUES, AND PATIENT DOSAGE HAS BEEN CONFIRMED, SUGGEST FOLLOW UP TESTING OF ANTITHROMBIN III LEVELS.   Type and screen [place]     Status: None   Collection Time: 03/11/16  8:58 PM  Result Value Ref Range   ABO/RH(D) O POS    Antibody Screen NEG    Sample Expiration 03/14/2016   Surgical PCR screen     Status: None   Collection Time: 03/11/16 11:40 PM  Result Value Ref Range   MRSA, PCR NEGATIVE NEGATIVE   Staphylococcus aureus NEGATIVE NEGATIVE    Comment:        The Xpert SA Assay (FDA approved for NASAL specimens in patients over 55 years of age), is one component of a comprehensive surveillance program.  Test  performance has been validated by [facility] for patients greater than or equal to 34 year old. It is not intended to diagnose infection nor to guide or monitor treatment.   Lactate dehydrogenase     Status: None   Collection Time: 03/12/16  2:13 AM  Result Value Ref Range   LDH 176 98 - 192 U/L  Protime-INR     Status: Abnormal   Collection Time: 03/12/16  2:13 AM  Result Value Ref Range   Prothrombin Time 15.5 (H) 11.4 - 15.2 seconds   INR 1.22   CBC     Status: None   Collection Time: 03/12/16  2:13 AM  Result Value Ref Range   WBC 7.5 4.0 - 10.5 K/uL   RBC 4.71 4.22 - 5.81 MIL/uL   Hemoglobin 13.4 13.0 - 17.0 g/dL   HCT 40.2 39.0 - 52.0 %   MCV 85.4 78.0 - 100.0 fL   MCH 28.5 26.0 - 34.0 pg   MCHC 33.3 30.0 - 36.0 g/dL   RDW 15.1 11.5 - 15.5 %   Platelets 161 150 - 400 K/uL  APTT     Status: Abnormal   Collection Time: 03/12/16  2:13 AM  Result Value Ref Range   aPTT 140 (H) 24 - 36 seconds    Comment:        IF BASELINE aPTT IS ELEVATED, SUGGEST PATIENT RISK ASSESSMENT BE USED TO DETERMINE APPROPRIATE ANTICOAGULANT THERAPY.   Blood gas, arterial     Status: None   Collection Time: 03/12/16  3:26 AM  Result Value Ref Range   pH, Arterial 7.404 7.350 - 7.450   pCO2 arterial 40.7 32.0 - 48.0 mmHg   pO2, Arterial 84.2 83.0 - 108.0 mmHg   Bicarbonate 24.9 20.0 - 28.0 mmol/L   Acid-Base Excess 0.7 0.0 - 2.0 mmol/L   O2 Saturation 95.8 %   Patient temperature 98.6    Collection site RIGHT RADIAL    Drawn by [id number]    Sample type ARTERIAL DRAW    Allens test (pass/fail) PASS PASS  Urinalysis, Routine w reflex microscopic     Status: None   Collection Time: 03/12/16  5:21 AM  Result Value Ref Range   Color, Urine YELLOW YELLOW   APPearance CLEAR CLEAR   Specific Gravity, Urine 1.011 1.005 - 1.030   pH 6.0 5.0 - 8.0   Glucose, UA NEGATIVE NEGATIVE mg/dL   Hgb urine dipstick NEGATIVE NEGATIVE   Bilirubin Urine NEGATIVE NEGATIVE   Ketones, ur NEGATIVE NEGATIVE  mg/dL   Protein, ur NEGATIVE NEGATIVE mg/dL   Nitrite NEGATIVE NEGATIVE   Leukocytes, UA NEGATIVE NEGATIVE  Anaerobic culture     Status: None  Collection Time: 03/12/16  8:18 AM  Result Value Ref Range   Specimen Description WOUND ABDOMEN    Special Requests SPEC A    Culture NO ANAEROBES ISOLATED    Report Status 03/17/2016 FINAL   Aerobic Culture (superficial specimen)     Status: None   Collection Time: 03/12/16  8:18 AM  Result Value Ref Range   Specimen Description WOUND ABDOMEN    Special Requests SPEC A    Gram Stain      ABUNDANT WBC PRESENT, PREDOMINANTLY PMN NO ORGANISMS SEEN    Culture      FEW SERRATIA MARCESCENS CRITICAL RESULT CALLED TO, READ BACK BY AND VERIFIED WITH: RN K.JOHNSON [id number] MLM    Report Status 03/14/2016 FINAL    Organism ID, Bacteria SERRATIA MARCESCENS       Susceptibility   Serratia marcescens - MIC*    CEFAZOLIN >=64 RESISTANT Resistant     CEFEPIME <=1 SENSITIVE Sensitive     CEFTAZIDIME <=1 SENSITIVE Sensitive     CEFTRIAXONE <=1 SENSITIVE Sensitive     CIPROFLOXACIN 2 INTERMEDIATE Intermediate     GENTAMICIN <=1 SENSITIVE Sensitive     TRIMETH/SULFA <=20 SENSITIVE Sensitive     * FEW SERRATIA MARCESCENS  Aerobic/Anaerobic Culture (surgical/deep wound)     Status: None   Collection Time: 03/12/16  8:24 AM  Result Value Ref Range   Specimen Description TISSUE ABDOMEN    Special Requests SPEC B    Gram Stain      FEW WBC PRESENT,BOTH PMN AND MONONUCLEAR NO ORGANISMS SEEN    Culture      FEW SERRATIA MARCESCENS CRITICAL RESULT CALLED TO, READ BACK BY AND VERIFIED WITH: RN K.JOHNSON [id number] MLM NO ANAEROBES ISOLATED    Report Status 03/17/2016 FINAL    Organism ID, Bacteria SERRATIA MARCESCENS       Susceptibility   Serratia marcescens - MIC*    CEFAZOLIN >=64 RESISTANT Resistant     CEFEPIME <=1 SENSITIVE Sensitive     CEFTAZIDIME <=1 SENSITIVE Sensitive     CEFTRIAXONE <=1 SENSITIVE Sensitive     CIPROFLOXACIN 2  INTERMEDIATE Intermediate     GENTAMICIN <=1 SENSITIVE Sensitive     TRIMETH/SULFA <=20 SENSITIVE Sensitive     * FEW SERRATIA MARCESCENS  Heparin level (unfractionated)     Status: None   Collection Time: 03/12/16  9:34 PM  Result Value Ref Range   Heparin Unfractionated 0.30 0.30 - 0.70 IU/mL    Comment:        IF HEPARIN RESULTS ARE BELOW EXPECTED VALUES, AND PATIENT DOSAGE HAS BEEN CONFIRMED, SUGGEST FOLLOW UP TESTING OF ANTITHROMBIN III LEVELS.   Lactate dehydrogenase     Status: None   Collection Time: 03/13/16  1:35 AM  Result Value Ref Range   LDH 179 98 - 192 U/L  Protime-INR     Status: Abnormal   Collection Time: 03/13/16  1:35 AM  Result Value Ref Range   Prothrombin Time 15.4 (H) 11.4 - 15.2 seconds   INR 1.22   CBC     Status: Abnormal   Collection Time: 03/13/16  1:35 AM  Result Value Ref Range   WBC 8.4 4.0 - 10.5 K/uL   RBC 4.70 4.22 - 5.81 MIL/uL   Hemoglobin 13.3 13.0 - 17.0 g/dL   HCT 40.0 39.0 - 52.0 %   MCV 85.1 78.0 - 100.0 fL   MCH 28.3 26.0 - 34.0 pg   MCHC 33.3 30.0 - 36.0 g/dL  RDW 15.4 11.5 - 15.5 %   Platelets 148 (L) 150 - 400 K/uL  Basic metabolic panel     Status: Abnormal   Collection Time: 03/13/16  1:35 AM  Result Value Ref Range   Sodium 134 (L) 135 - 145 mmol/L   Potassium 3.7 3.5 - 5.1 mmol/L   Chloride 101 101 - 111 mmol/L   CO2 26 22 - 32 mmol/L   Glucose, Bld 112 (H) 65 - 99 mg/dL   BUN 9 6 - 20 mg/dL   Creatinine, Ser 1.13 0.61 - 1.24 mg/dL   Calcium 8.6 (L) 8.9 - 10.3 mg/dL   GFR calc non Af Amer >60 >60 mL/min   GFR calc Af Amer >60 >60 mL/min    Comment: (NOTE) The eGFR has been calculated using the CKD EPI equation. This calculation has not been validated in all clinical situations. eGFR's persistently <60 mL/min signify possible Chronic Kidney Disease.    Anion gap 7 5 - 15  Heparin level (unfractionated)     Status: Abnormal   Collection Time: 03/13/16  8:04 AM  Result Value Ref Range   Heparin Unfractionated  0.25 (L) 0.30 - 0.70 IU/mL    Comment:        IF HEPARIN RESULTS ARE BELOW EXPECTED VALUES, AND PATIENT DOSAGE HAS BEEN CONFIRMED, SUGGEST FOLLOW UP TESTING OF ANTITHROMBIN III LEVELS.   Heparin level (unfractionated)     Status: Abnormal   Collection Time: 03/13/16 10:36 PM  Result Value Ref Range   Heparin Unfractionated 0.20 (L) 0.30 - 0.70 IU/mL    Comment:        IF HEPARIN RESULTS ARE BELOW EXPECTED VALUES, AND PATIENT DOSAGE HAS BEEN CONFIRMED, SUGGEST FOLLOW UP TESTING OF ANTITHROMBIN III LEVELS.   Lactate dehydrogenase     Status: None   Collection Time: 03/14/16  2:02 AM  Result Value Ref Range   LDH 192 98 - 192 U/L  Protime-INR     Status: Abnormal   Collection Time: 03/14/16  2:02 AM  Result Value Ref Range   Prothrombin Time 15.4 (H) 11.4 - 15.2 seconds   INR 1.21   CBC     Status: Abnormal   Collection Time: 03/14/16  2:02 AM  Result Value Ref Range   WBC 8.9 4.0 - 10.5 K/uL   RBC 4.63 4.22 - 5.81 MIL/uL   Hemoglobin 13.1 13.0 - 17.0 g/dL   HCT 40.0 39.0 - 52.0 %   MCV 86.4 78.0 - 100.0 fL   MCH 28.3 26.0 - 34.0 pg   MCHC 32.8 30.0 - 36.0 g/dL   RDW 15.4 11.5 - 15.5 %   Platelets 149 (L) 150 - 400 K/uL  Comprehensive metabolic panel     Status: Abnormal   Collection Time: 03/14/16  2:02 AM  Result Value Ref Range   Sodium 135 135 - 145 mmol/L   Potassium 3.9 3.5 - 5.1 mmol/L   Chloride 102 101 - 111 mmol/L   CO2 22 22 - 32 mmol/L   Glucose, Bld 95 65 - 99 mg/dL   BUN 12 6 - 20 mg/dL   Creatinine, Ser 1.21 0.61 - 1.24 mg/dL   Calcium 8.8 (L) 8.9 - 10.3 mg/dL   Total Protein 6.5 6.5 - 8.1 g/dL   Albumin 3.6 3.5 - 5.0 g/dL   AST 15 15 - 41 U/L   ALT 13 (L) 17 - 63 U/L   Alkaline Phosphatase 79 38 - 126 U/L   Total Bilirubin 0.6 0.3 -  1.2 mg/dL   GFR calc non Af Amer >60 >60 mL/min   GFR calc Af Amer >60 >60 mL/min    Comment: (NOTE) The eGFR has been calculated using the CKD EPI equation. This calculation has not been validated in all clinical  situations. eGFR's persistently <60 mL/min signify possible Chronic Kidney Disease.    Anion gap 11 5 - 15  Heparin level (unfractionated)     Status: Abnormal   Collection Time: 03/14/16  2:02 AM  Result Value Ref Range   Heparin Unfractionated 0.17 (L) 0.30 - 0.70 IU/mL    Comment:        IF HEPARIN RESULTS ARE BELOW EXPECTED VALUES, AND PATIENT DOSAGE HAS BEEN CONFIRMED, SUGGEST FOLLOW UP TESTING OF ANTITHROMBIN III LEVELS.   Lactate dehydrogenase     Status: None   Collection Time: 03/15/16  3:53 AM  Result Value Ref Range   LDH 174 98 - 192 U/L  CBC     Status: Abnormal   Collection Time: 03/15/16  3:53 AM  Result Value Ref Range   WBC 7.9 4.0 - 10.5 K/uL   RBC 4.46 4.22 - 5.81 MIL/uL   Hemoglobin 12.8 (L) 13.0 - 17.0 g/dL   HCT 38.5 (L) 39.0 - 52.0 %   MCV 86.3 78.0 - 100.0 fL   MCH 28.7 26.0 - 34.0 pg   MCHC 33.2 30.0 - 36.0 g/dL   RDW 15.6 (H) 11.5 - 15.5 %   Platelets 145 (L) 150 - 400 K/uL  Basic metabolic panel     Status: Abnormal   Collection Time: 03/15/16  3:53 AM  Result Value Ref Range   Sodium 136 135 - 145 mmol/L   Potassium 4.0 3.5 - 5.1 mmol/L   Chloride 105 101 - 111 mmol/L   CO2 24 22 - 32 mmol/L   Glucose, Bld 91 65 - 99 mg/dL   BUN 10 6 - 20 mg/dL   Creatinine, Ser 1.02 0.61 - 1.24 mg/dL   Calcium 8.8 (L) 8.9 - 10.3 mg/dL   GFR calc non Af Amer >60 >60 mL/min   GFR calc Af Amer >60 >60 mL/min    Comment: (NOTE) The eGFR has been calculated using the CKD EPI equation. This calculation has not been validated in all clinical situations. eGFR's persistently <60 mL/min signify possible Chronic Kidney Disease.    Anion gap 7 5 - 15  Heparin level (unfractionated)     Status: Abnormal   Collection Time: 03/15/16  4:00 AM  Result Value Ref Range   Heparin Unfractionated 0.13 (L) 0.30 - 0.70 IU/mL    Comment:        IF HEPARIN RESULTS ARE BELOW EXPECTED VALUES, AND PATIENT DOSAGE HAS BEEN CONFIRMED, SUGGEST FOLLOW UP TESTING OF ANTITHROMBIN  III LEVELS.   Protime-INR     Status: Abnormal   Collection Time: 03/15/16  4:00 AM  Result Value Ref Range   Prothrombin Time 15.4 (H) 11.4 - 15.2 seconds   INR 1.22   Lactate dehydrogenase     Status: None   Collection Time: 03/16/16  2:09 AM  Result Value Ref Range   LDH 179 98 - 192 U/L  CBC     Status: Abnormal   Collection Time: 03/16/16  2:09 AM  Result Value Ref Range   WBC 7.9 4.0 - 10.5 K/uL   RBC 4.37 4.22 - 5.81 MIL/uL   Hemoglobin 12.4 (L) 13.0 - 17.0 g/dL   HCT 37.8 (L) 39.0 - 52.0 %  MCV 86.5 78.0 - 100.0 fL   MCH 28.4 26.0 - 34.0 pg   MCHC 32.8 30.0 - 36.0 g/dL   RDW 15.8 (H) 11.5 - 15.5 %   Platelets 137 (L) 150 - 400 K/uL  Basic metabolic panel     Status: Abnormal   Collection Time: 03/16/16  2:09 AM  Result Value Ref Range   Sodium 137 135 - 145 mmol/L   Potassium 4.3 3.5 - 5.1 mmol/L   Chloride 105 101 - 111 mmol/L   CO2 25 22 - 32 mmol/L   Glucose, Bld 88 65 - 99 mg/dL   BUN 10 6 - 20 mg/dL   Creatinine, Ser 1.03 0.61 - 1.24 mg/dL   Calcium 8.8 (L) 8.9 - 10.3 mg/dL   GFR calc non Af Amer >60 >60 mL/min   GFR calc Af Amer >60 >60 mL/min    Comment: (NOTE) The eGFR has been calculated using the CKD EPI equation. This calculation has not been validated in all clinical situations. eGFR's persistently <60 mL/min signify possible Chronic Kidney Disease.    Anion gap 7 5 - 15  Heparin level (unfractionated)     Status: Abnormal   Collection Time: 03/16/16  2:09 AM  Result Value Ref Range   Heparin Unfractionated <0.10 (L) 0.30 - 0.70 IU/mL    Comment:        IF HEPARIN RESULTS ARE BELOW EXPECTED VALUES, AND PATIENT DOSAGE HAS BEEN CONFIRMED, SUGGEST FOLLOW UP TESTING OF ANTITHROMBIN III LEVELS.   Protime-INR     Status: Abnormal   Collection Time: 03/16/16  2:09 AM  Result Value Ref Range   Prothrombin Time 16.2 (H) 11.4 - 15.2 seconds   INR 1.29   Lactate dehydrogenase     Status: None   Collection Time: 03/17/16  4:20 AM  Result Value Ref  Range   LDH 168 98 - 192 U/L  Protime-INR     Status: Abnormal   Collection Time: 03/17/16  4:20 AM  Result Value Ref Range   Prothrombin Time 16.9 (H) 11.4 - 15.2 seconds   INR 1.36   CBC     Status: Abnormal   Collection Time: 03/17/16  4:20 AM  Result Value Ref Range   WBC 8.1 4.0 - 10.5 K/uL   RBC 4.55 4.22 - 5.81 MIL/uL   Hemoglobin 12.7 (L) 13.0 - 17.0 g/dL   HCT 39.7 39.0 - 52.0 %   MCV 87.3 78.0 - 100.0 fL   MCH 27.9 26.0 - 34.0 pg   MCHC 32.0 30.0 - 36.0 g/dL   RDW 15.8 (H) 11.5 - 15.5 %   Platelets 138 (L) 150 - 400 K/uL  Basic metabolic panel     Status: None   Collection Time: 03/17/16  4:20 AM  Result Value Ref Range   Sodium 138 135 - 145 mmol/L   Potassium 4.0 3.5 - 5.1 mmol/L   Chloride 105 101 - 111 mmol/L   CO2 25 22 - 32 mmol/L   Glucose, Bld 93 65 - 99 mg/dL   BUN 12 6 - 20 mg/dL   Creatinine, Ser 0.95 0.61 - 1.24 mg/dL   Calcium 8.9 8.9 - 10.3 mg/dL   GFR calc non Af Amer >60 >60 mL/min   GFR calc Af Amer >60 >60 mL/min    Comment: (NOTE) The eGFR has been calculated using the CKD EPI equation. This calculation has not been validated in all clinical situations. eGFR's persistently <60 mL/min signify possible Chronic Kidney Disease.  Anion gap 8 5 - 15  Heparin level (unfractionated)     Status: Abnormal   Collection Time: 03/17/16  4:20 AM  Result Value Ref Range   Heparin Unfractionated 0.17 (L) 0.30 - 0.70 IU/mL    Comment:        IF HEPARIN RESULTS ARE BELOW EXPECTED VALUES, AND PATIENT DOSAGE HAS BEEN CONFIRMED, SUGGEST FOLLOW UP TESTING OF ANTITHROMBIN III LEVELS.   Heparin level (unfractionated)     Status: Abnormal   Collection Time: 03/18/16  3:57 AM  Result Value Ref Range   Heparin Unfractionated 0.22 (L) 0.30 - 0.70 IU/mL    Comment:        IF HEPARIN RESULTS ARE BELOW EXPECTED VALUES, AND PATIENT DOSAGE HAS BEEN CONFIRMED, SUGGEST FOLLOW UP TESTING OF ANTITHROMBIN III LEVELS.   CBC     Status: Abnormal   Collection Time:  03/18/16  3:57 AM  Result Value Ref Range   WBC 7.3 4.0 - 10.5 K/uL   RBC 4.58 4.22 - 5.81 MIL/uL   Hemoglobin 13.1 13.0 - 17.0 g/dL   HCT 39.5 39.0 - 52.0 %   MCV 86.2 78.0 - 100.0 fL   MCH 28.6 26.0 - 34.0 pg   MCHC 33.2 30.0 - 36.0 g/dL   RDW 15.8 (H) 11.5 - 15.5 %   Platelets 135 (L) 150 - 400 K/uL  Lactate dehydrogenase     Status: None   Collection Time: 03/18/16  3:57 AM  Result Value Ref Range   LDH 171 98 - 192 U/L  Protime-INR     Status: Abnormal   Collection Time: 03/18/16  3:57 AM  Result Value Ref Range   Prothrombin Time 18.0 (H) 11.4 - 15.2 seconds   INR 1.47   Lactate dehydrogenase     Status: None   Collection Time: 03/19/16  5:00 AM  Result Value Ref Range   LDH 179 98 - 192 U/L  Protime-INR     Status: Abnormal   Collection Time: 03/19/16  5:00 AM  Result Value Ref Range   Prothrombin Time 19.8 (H) 11.4 - 15.2 seconds   INR 1.66   CBC     Status: Abnormal   Collection Time: 03/19/16  5:00 AM  Result Value Ref Range   WBC 8.3 4.0 - 10.5 K/uL   RBC 4.71 4.22 - 5.81 MIL/uL   Hemoglobin 13.2 13.0 - 17.0 g/dL   HCT 41.1 39.0 - 52.0 %   MCV 87.3 78.0 - 100.0 fL   MCH 28.0 26.0 - 34.0 pg   MCHC 32.1 30.0 - 36.0 g/dL   RDW 15.9 (H) 11.5 - 15.5 %   Platelets 147 (L) 150 - 400 K/uL  Heparin level (unfractionated)     Status: Abnormal   Collection Time: 03/19/16  5:00 AM  Result Value Ref Range   Heparin Unfractionated 0.22 (L) 0.30 - 0.70 IU/mL    Comment:        IF HEPARIN RESULTS ARE BELOW EXPECTED VALUES, AND PATIENT DOSAGE HAS BEEN CONFIRMED, SUGGEST FOLLOW UP TESTING OF ANTITHROMBIN III LEVELS.   Lactate dehydrogenase     Status: Abnormal   Collection Time: 03/20/16  4:13 AM  Result Value Ref Range   LDH 195 (H) 98 - 192 U/L  Protime-INR     Status: Abnormal   Collection Time: 03/20/16  4:13 AM  Result Value Ref Range   Prothrombin Time 22.0 (H) 11.4 - 15.2 seconds   INR 1.90   CBC  Status: Abnormal   Collection Time: 03/20/16  4:13 AM    Result Value Ref Range   WBC 7.7 4.0 - 10.5 K/uL   RBC 4.82 4.22 - 5.81 MIL/uL   Hemoglobin 13.5 13.0 - 17.0 g/dL   HCT 41.4 39.0 - 52.0 %   MCV 85.9 78.0 - 100.0 fL   MCH 28.0 26.0 - 34.0 pg   MCHC 32.6 30.0 - 36.0 g/dL   RDW 15.7 (H) 11.5 - 15.5 %   Platelets 161 150 - 400 K/uL  Heparin level (unfractionated)     Status: Abnormal   Collection Time: 03/20/16  4:13 AM  Result Value Ref Range   Heparin Unfractionated 0.25 (L) 0.30 - 0.70 IU/mL    Comment:        IF HEPARIN RESULTS ARE BELOW EXPECTED VALUES, AND PATIENT DOSAGE HAS BEEN CONFIRMED, SUGGEST FOLLOW UP TESTING OF ANTITHROMBIN III LEVELS.   Basic metabolic panel     Status: Abnormal   Collection Time: 03/20/16  4:13 AM  Result Value Ref Range   Sodium 136 135 - 145 mmol/L   Potassium 4.2 3.5 - 5.1 mmol/L   Chloride 103 101 - 111 mmol/L   CO2 20 (L) 22 - 32 mmol/L   Glucose, Bld 85 65 - 99 mg/dL   BUN 13 6 - 20 mg/dL   Creatinine, Ser 1.02 0.61 - 1.24 mg/dL   Calcium 9.2 8.9 - 10.3 mg/dL   GFR calc non Af Amer >60 >60 mL/min   GFR calc Af Amer >60 >60 mL/min    Comment: (NOTE) The eGFR has been calculated using the CKD EPI equation. This calculation has not been validated in all clinical situations. eGFR's persistently <60 mL/min signify possible Chronic Kidney Disease.    Anion gap 13 5 - 15  POCT INR     Status: None   Collection Time: 03/23/16 12:00 AM  Result Value Ref Range   INR 1.6   CBC w/Diff/Platelet     Status: None   Collection Time: 03/24/16  1:48 PM  Result Value Ref Range   WBC 7.3 4.0 - 10.5 K/uL   RBC 4.83 4.22 - 5.81 MIL/uL   Hemoglobin 13.7 13.0 - 17.0 g/dL   HCT 41.8 39.0 - 52.0 %   MCV 86.5 78.0 - 100.0 fL   MCH 28.4 26.0 - 34.0 pg   MCHC 32.8 30.0 - 36.0 g/dL   RDW 15.0 11.5 - 15.5 %   Platelets 177 150 - 400 K/uL   Neutrophils Relative % 62 %   Neutro Abs 4.5 1.7 - 7.7 K/uL   Lymphocytes Relative 27 %   Lymphs Abs 2.0 0.7 - 4.0 K/uL   Monocytes Relative 9 %   Monocytes  Absolute 0.6 0.1 - 1.0 K/uL   Eosinophils Relative 2 %   Eosinophils Absolute 0.1 0.0 - 0.7 K/uL   Basophils Relative 0 %   Basophils Absolute 0.0 0.0 - 0.1 K/uL  Lactate Dehydrogenase (LDH)     Status: Abnormal   Collection Time: 03/24/16  1:48 PM  Result Value Ref Range   LDH 230 (H) 98 - 192 U/L  INR/PT     Status: Abnormal   Collection Time: 03/24/16  1:48 PM  Result Value Ref Range   Prothrombin Time 19.6 (H) 11.4 - 15.2 seconds   INR 1.63   B Nat Peptide     Status: None   Collection Time: 03/24/16  1:48 PM  Result Value Ref Range   B Natriuretic  Peptide 37.6 0.0 - 100.0 pg/mL  Comprehensive Metabolic Panel (CMET)     Status: None   Collection Time: 03/24/16  1:51 PM  Result Value Ref Range   Sodium 139 135 - 145 mmol/L   Potassium 3.9 3.5 - 5.1 mmol/L    Comment: SLIGHT HEMOLYSIS   Chloride 107 101 - 111 mmol/L   CO2 25 22 - 32 mmol/L   Glucose, Bld 96 65 - 99 mg/dL   BUN 10 6 - 20 mg/dL   Creatinine, Ser 1.14 0.61 - 1.24 mg/dL   Calcium 9.1 8.9 - 10.3 mg/dL   Total Protein 7.1 6.5 - 8.1 g/dL   Albumin 4.1 3.5 - 5.0 g/dL   AST 25 15 - 41 U/L   ALT 20 17 - 63 U/L   Alkaline Phosphatase 90 38 - 126 U/L   Total Bilirubin 0.8 0.3 - 1.2 mg/dL   GFR calc non Af Amer >60 >60 mL/min   GFR calc Af Amer >60 >60 mL/min    Comment: (NOTE) The eGFR has been calculated using the CKD EPI equation. This calculation has not been validated in all clinical situations. eGFR's persistently <60 mL/min signify possible Chronic Kidney Disease.    Anion gap 7 5 - 15  Protime-INR     Status: Abnormal   Collection Time: 03/25/16  2:45 AM  Result Value Ref Range   Prothrombin Time 21.2 (H) 11.4 - 15.2 seconds   INR 1.80   CBC     Status: Abnormal   Collection Time: 03/25/16  2:45 AM  Result Value Ref Range   WBC 7.8 4.0 - 10.5 K/uL   RBC 4.43 4.22 - 5.81 MIL/uL   Hemoglobin 12.6 (L) 13.0 - 17.0 g/dL   HCT 38.4 (L) 39.0 - 52.0 %   MCV 86.7 78.0 - 100.0 fL   MCH 28.4 26.0 - 34.0 pg    MCHC 32.8 30.0 - 36.0 g/dL   RDW 15.1 11.5 - 15.5 %   Platelets 173 150 - 400 K/uL  CBC     Status: Abnormal   Collection Time: 03/26/16 11:20 AM  Result Value Ref Range   WBC 7.2 4.0 - 10.5 K/uL   RBC 4.28 4.22 - 5.81 MIL/uL   Hemoglobin 12.3 (L) 13.0 - 17.0 g/dL   HCT 37.0 (L) 39.0 - 52.0 %   MCV 86.4 78.0 - 100.0 fL   MCH 28.7 26.0 - 34.0 pg   MCHC 33.2 30.0 - 36.0 g/dL   RDW 15.2 11.5 - 15.5 %   Platelets 166 150 - 400 K/uL  INR/PT     Status: Abnormal   Collection Time: 03/26/16 11:20 AM  Result Value Ref Range   Prothrombin Time 21.4 (H) 11.4 - 15.2 seconds   INR 1.83   Lactate Dehydrogenase (LDH)     Status: Abnormal   Collection Time: 03/26/16 11:20 AM  Result Value Ref Range   LDH 205 (H) 98 - 192 U/L  Protime-INR     Status: None   Collection Time: 03/30/16  9:23 AM  Result Value Ref Range   Prothrombin Time 13.8 11.4 - 15.2 seconds   INR 1.06   Lactate dehydrogenase     Status: None   Collection Time: 03/30/16  9:23 AM  Result Value Ref Range   LDH 176 98 - 192 U/L  Protime-INR     Status: None   Collection Time: 04/02/16  9:25 AM  Result Value Ref Range   Prothrombin Time 15.2 11.4 -  15.2 seconds   INR 1.19   Lactate dehydrogenase     Status: None   Collection Time: 04/02/16  9:25 AM  Result Value Ref Range   LDH 186 98 - 192 U/L  Lactate dehydrogenase     Status: Abnormal   Collection Time: 04/06/16  9:29 AM  Result Value Ref Range   LDH 225 (H) 98 - 192 U/L  Protime-INR     Status: Abnormal   Collection Time: 04/06/16  9:29 AM  Result Value Ref Range   Prothrombin Time 25.7 (H) 11.4 - 15.2 seconds   INR 2.30   CBC     Status: None   Collection Time: 04/13/16 10:39 AM  Result Value Ref Range   WBC 7.1 4.0 - 10.5 K/uL   RBC 4.56 4.22 - 5.81 MIL/uL   Hemoglobin 13.0 13.0 - 17.0 g/dL   HCT 39.2 39.0 - 52.0 %   MCV 86.0 78.0 - 100.0 fL   MCH 28.5 26.0 - 34.0 pg   MCHC 33.2 30.0 - 36.0 g/dL   RDW 14.6 11.5 - 15.5 %   Platelets 189 150 - 400  K/uL  Lactate Dehydrogenase (LDH)     Status: Abnormal   Collection Time: 04/13/16 10:39 AM  Result Value Ref Range   LDH 225 (H) 98 - 192 U/L  INR/PT     Status: Abnormal   Collection Time: 04/13/16 10:39 AM  Result Value Ref Range   Prothrombin Time 24.2 (H) 11.4 - 15.2 seconds   INR 2.13     ASSESSMENT AND PLAN:   1) Chronic systolic HF, s/p HMII LVAD implant 05/2012.  -  NYHA II. - Listed 1A at [facility]. Also with visit to [facility] for dual listing.  - He is not on a BB. Intolerant BB. Volume status stable. No diuretics for now. Continue current HF meds.    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   Changed driveline dressing using sterile technique. Reapplied ACell.  Plan to continue ceftriaxone 2 grams every 12 hours per ID. Plan for ceftriaxone at least 6 wks. PICC site ok.  - He will follow up with Dr Johnnye Sima the end of January. No change for ID follow up the end of the month.   3) HTN-  - Maps running high today. Add back 5 mg amlodipine daily. Continue hydralazine 25 mg twice a day and entresto at current dose. Check MAP next week.  3) Hyperthyroidism - Amio induced. Follows with Dr. Forde Dandy. Amio stopped. Recent TSH 02/2016 1.422   4) NSVT/VT  - Off amio. On mexilitene. - ICD now at EOL. No plans to replace at this time. Reviewed 04/14/16 continue current plan.  5) PVCs - Resolved.   6) Anticoagulation with coumadin  - Continue coumadin and ASA 325 mg for LVAD. No bleeding problems. Check INR next week.   7) LVAD  - Interrogation stable.    Follow up next week LVAD dressing change.   Zena Vitelli NP-C  9:34 AM

## 2016-04-20 NOTE — Patient Instructions (Addendum)
INCREASE your Amlodipine to 5 mg daily   Back to see [place] next week Thursday 25th at 9:00  Labs next week.

## 2016-04-29 ENCOUNTER — Ambulatory Visit (HOSPITAL_COMMUNITY): Payer: Self-pay | Admitting: Pharmacist

## 2016-04-29 ENCOUNTER — Ambulatory Visit (HOSPITAL_COMMUNITY)
Admission: RE | Admit: 2016-04-29 | Discharge: 2016-04-29 | Disposition: A | Discharge status: Home / Self Care | Payer: Medicaid Other | Source: Ambulatory Visit | Attending: Cardiology | Admitting: Cardiology

## 2016-04-29 DIAGNOSIS — I10 Essential (primary) hypertension: Secondary | ICD-10-CM | POA: Diagnosis not present

## 2016-04-29 DIAGNOSIS — T829XXD Unspecified complication of cardiac and vascular prosthetic device, implant and graft, subsequent encounter: Secondary | ICD-10-CM

## 2016-04-29 DIAGNOSIS — Z95811 Presence of heart assist device: Secondary | ICD-10-CM

## 2016-04-29 DIAGNOSIS — A498 Other bacterial infections of unspecified site: Secondary | ICD-10-CM | POA: Diagnosis not present

## 2016-04-29 DIAGNOSIS — Z7901 Long term (current) use of anticoagulants: Secondary | ICD-10-CM | POA: Diagnosis not present

## 2016-04-29 LAB — BASIC METABOLIC PANEL
ANION GAP: 8 (ref 5–15)
BUN: 9 mg/dL (ref 6–20)
CALCIUM: 9.1 mg/dL (ref 8.9–10.3)
CHLORIDE: 106 mmol/L (ref 101–111)
CO2: 25 mmol/L (ref 22–32)
Creatinine, Ser: 1.02 mg/dL (ref 0.61–1.24)
GFR calc non Af Amer: >60 mL/min (ref >60)
Glucose, Bld: 90 mg/dL (ref 65–99)
Potassium: 3.7 mmol/L (ref 3.5–5.1)
SODIUM: 139 mmol/L (ref 135–145)

## 2016-04-29 LAB — PROTIME-INR
INR: 1.8
Prothrombin Time: 21.1 seconds — ABNORMAL HIGH (ref 11.4–15.2)

## 2016-04-29 LAB — CBC
HCT: 39.3 % (ref 39.0–52.0)
HEMOGLOBIN: 12.6 g/dL — AB (ref 13.0–17.0)
MCH: 27.3 pg (ref 26.0–34.0)
MCHC: 32.1 g/dL (ref 30.0–36.0)
MCV: 85.2 fL (ref 78.0–100.0)
Platelets: 150 10*3/uL (ref 150–400)
RBC: 4.61 MIL/uL (ref 4.22–5.81)
RDW: 14.9 % (ref 11.5–15.5)
WBC: 7 10*3/uL (ref 4.0–10.5)

## 2016-04-29 LAB — LACTATE DEHYDROGENASE: LDH: 232 U/L — ABNORMAL HIGH (ref 98–192)

## 2016-04-29 NOTE — Patient Instructions (Signed)
Return to clinic in one week

## 2016-04-29 NOTE — Progress Notes (Signed)
Patient presents for dressing change   follow up in V[facility] Clinic today. Reports no problems with VAD equipment or concerns with drive line.  Vital Signs:  Doppler Pressure 90   Automatc BP: 123/81 (94) HR:80   SPO2:98  %  Weight: 257 lb w/o eqt Last weight: 252 lb Home weights: 250 lbs There is no height or weight on file to calculate BMI.   VAD Indication: Bridge to Transplant- Evaluation completed at [facility] with IB listing 05/30/2013; dual listed with [facility] 12/04/2014.  1A [facility] &1B [facility].     VAD interrogation & Equipment Management: Speed:9400 Flow: 4.9 Power:5.6 w    PI:7.5  Alarms: no clinical alarms Events: 5-10 daily  Fixed speed 9400 Low speed limit: 8800  Primary Controller:  Replace back up battery in 42months. Back up controller:   Replace back up battery in 16 months.  Annual Equipment Maintenance on UBC/PM was performed on 06/19/2015.   I reviewed the LVAD parameters from todayand compared the results to the patient's prior recorded data with Tonye Becket, NP. LVAD interrogation was NEGATIVE for significant power changes, NEGATIVE for clinical alarms and STABLE for PI events/speed drops. No programming changes were made and pump is functioning within specified parameters. Pt is performing daily controller and system monitor self tests along with completing weekly and monthly maintenance for LVAD equipment.  LVAD equipment check completed and is in good working order. Back-up equipment present. Charged back up battery and performed self-test on equipment.   Exit Site Care: Drive line dressing changed per Tonye Becket NP today in clinic with Acell application.     Significant Events on VAD Support:  08/26/15 >>Serratia marcescens (rare) >>Cipro 500 mg bid x 7 days  09/29/15 >> Serratia marcescens (abundant) >> Cipro 500 mg BID x14d 11/20/15 >> Serratia marcescens (few) Admitted IV ceftriaxone >> cipro 500 mg twice daily x 30 days 02/03/16 >>  Serratia species >> Cipro 500 mg BID x 14d; referral to ID 02/13/16 03/11/2016 >> driveline debridement, IV antibiotics  Device:Medtronic Dual  Therapies: on Last check:08/05/2014 (assessed Optivol 02/05/16)   BP & Labs:  MAP 90 - Doppler is reflecting Map  Hgb 12.6 - No S/S of bleeding. Specifically denies melena/BRBPR or nosebleeds.   Return to clinic in 1 week  Marcellus Scott RN VAD Coordinator   Office: [phone] 24/7 Emergency VAD Pager: [phone]

## 2016-05-01 NOTE — Progress Notes (Signed)
LVAD CLINIC NOTE  Patient ID: Robert [name], male   DOB: [birth year], 61 y.o.   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 status post dual chamber Medtronic ICD implanted April 2011. Cath 2011 showed normal coronaries. He also has a history of VTach .   Amio recently stopped due to thyrotoxicity. Placed on prednisone and tapazole. Had VT off amio and mexilitene started.  Had Melba on 8/17 as part of dual listing process at [facility]. Normal filling pressures and cardiac output.   Drive Line Infeciton  08/26/15 >>Serratia marcescens (rare) >>Cipro 500 mg bid x 7 days  09/29/15 >> Serratia marcescens (abundant) >>Cipro 500 mg BID x14d 11/20/15 >>Serratia marcescens (few) Admitted IV ceftriaxone >>cipro 500 mg twice daily x 30 days 02/03/16 >> Serratia species >> Cipro 500 mg BID x 14d; referral to ID  03/12/2016 Surgical Debridement with VAC placement. Full thickness 5x8x6 cm. 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 Plan to continued ced on cefepime 1 gram IV every 8 hours  03/16/2016 PICC line placed for IV antibiotics.  03/17/2016 VAC reapplied with 1 gram of A Cell applied. 03/25/2016 VAC removed Damp saline dressing applied.  04/02/2016 Acell applied  04/20/2016 Acell applied.   Admitted to [facility] on 12/5 for heparin bridge and surgical debridement of persistent drive line infection. VAC applied with A cell. VAC maintained seal at 75 mm hg continuous therapy. Plan for ceftriaxone for 6 wks until 04/23/16. Will also need VAC changes weekly with ACell. [facility] following for home antibiotics.   He returns for LVAD follow up and dressing change. Overall feels good. Denies SOB/PND/Orthopnea. Denies CP. Appetite ok. No fever or chills. No bleeding problems. Dressing intact. Follow up with ID  next week. Continue [facility] for home antibiotics. No PICC issues. No RUE  PICC issues. Denies R upper arm pain.  Taking all medications.   Recent RHC on 11/06/15 as part of Transplant process RA = 6 RV =  18/3/6 PA = 19/7 (12) PCW = 8 Fick cardiac output/index = 6.1/2.6 PVR = .0.7 WU Ao sat = 98% PA sat = 70%, 69%:   LVAD interrogation reveals:  Speed:9400 Flow: 4.9 Power:5.6 w PI:7.5 Alarms: no clinical alarms Events: 5-10 daily  Fixed speed 9400 Low speed limit: 8800 I reviewed the LVAD parameters from today, and compared the results to the patient's prior recorded data.  No programming changes were made.  The LVAD is functioning within specified parameters.  The patient performs LVAD self-test daily.  LVAD interrogation was negative for any significant power changes, alarms or PI events/speed drops.  LVAD equipment check completed and is in good working order.  Back-up equipment present.   LVAD education done on emergency procedures and precautions and reviewed exit site care.    Past Medical History:  Diagnosis Date  . AICD (automatic cardioverter/defibrillator) present 2011  . Bronchitis   . Congestive heart failure ([place]) 2011  . Hypertension 2000  . LVAD (left ventricular assist device) present ([place]) 2014  . Pneumonia   . Pulmonary hypertension     Current Outpatient Prescriptions  Medication Sig Dispense Refill  . allopurinol (ZYLOPRIM) 300 MG tablet Take 1 tablet (300 mg total) by mouth daily. 30 tablet 6  . amLODipine (NORVASC) 2.5 MG tablet Take 1 tablet (  2.5 mg total) by mouth daily. 14 tablet 0  . aspirin EC 325 MG tablet Take 1 tablet (325 mg total) by mouth daily. 30 tablet 6  . furosemide (LASIX) 40 MG tablet Take 1 tablet (40 mg total) by mouth daily as needed. Take as directed by clinic 30 tablet 3  . hydrALAZINE (APRESOLINE) 50 MG tablet Take 0.5 tablets (25 mg total) by mouth 2 (two) times daily. 30 tablet 6  . mexiletine (MEXITIL) 150 MG capsule Take 1  capsule (150 mg total) by mouth 2 (two) times daily. 60 capsule 5  . Multiple Vitamin (MULTIVITAMIN) tablet Take 1 tablet by mouth daily.    . pantoprazole (PROTONIX) 40 MG tablet Take 1 tablet (40 mg total) by mouth daily. 30 tablet 3  . sacubitril-valsartan (ENTRESTO) 97-103 MG Take 1 tablet by mouth 2 (two) times daily. 60 tablet 3  . traMADol (ULTRAM) 50 MG tablet Take 1 tablet (50 mg total) by mouth every 8 (eight) hours as needed for moderate pain. 30 tablet 5  . warfarin (COUMADIN) 5 MG tablet Take daily as directed by VAD clinic based on weekly INR results. 30 tablet 3  . LOVENOX 100 MG/ML injection Inject 1 mL (100 mg total) into the skin every 12 (twelve) hours. (Patient not taking: Reported on 04/20/2016) 10 Syringe 1   No current facility-administered medications for this encounter.     Metoprolol and Imdur [isosorbide nitrate]  REVIEW OF SYSTEMS: All systems negative except as listed in HPI, PMH and Problem list. Vital Signs:  Doppler Pressure 90                Automatc BP: 123/81 (94) HR:80  SPO2:98  %  Weight: 257 lb w/o eqt Last weight: 252 lb Home weights: 250 lbs There is no height or weight on file to calculate BMI.  VAD Indication: Bridge to Transplant- Evaluation completed at [facility] with IB listing 05/30/2013; dual listed with [facility] 12/04/2014.  1A [facility] &1B [place].    VAD interrogation & Equipment Management: Speed:9400 Flow: 4.9 Power:5.6 w PI:7.5 Alarms: no clinical alarms Events: 5-10 daily Fixed speed 9400 Low speed limit: 8800  Physical Exam: GENERAL: Well appearing, male who presents to clinic.  NAD  HEENT: normal  NECK: Supple, JVP flat;  2+ bilaterally, no bruits.  No lymphadenopathy or thyromegaly appreciated.  CARDIAC:  Mechanical heart sounds with LVAD hum present.  LUNGS:  CTAB ABDOMEN:  Soft, round, nontender, positive bowel sounds x4.     LVAD exit site: Full thickness wound . 100% granulation. No odor. Minimal exudate.  Driveline has multiple twists.  EXTREMITIES:  Warm No cyanosis. RUE PICC ok. RUE non tender.  NEUROLOGIC:  A&O x3. Gait steady.  No aphasia.  No dysarthria.  Affect pleasant.     Recent Results (from the past 2160 hour(s))  Basic metabolic panel     Status: None   Collection Time: 02/03/16 12:43 PM  Result Value Ref Range   Sodium 139 135 - 145 mmol/L   Potassium 4.0 3.5 - 5.1 mmol/L   Chloride 106 101 - 111 mmol/L   CO2 26 22 - 32 mmol/L   Glucose, Bld 90 65 - 99 mg/dL   BUN 12 6 - 20 mg/dL   Creatinine, Ser 1.10 0.61 - 1.24 mg/dL   Calcium 9.2 8.9 - 10.3 mg/dL   GFR calc non Af Amer >60 >60 mL/min   GFR calc Af Amer >60 >60 mL/min    Comment: (NOTE) The eGFR has been  calculated using the CKD EPI equation. This calculation has not been validated in all clinical situations. eGFR's persistently <60 mL/min signify possible Chronic Kidney Disease.    Anion gap 7 5 - 15  CBC     Status: None   Collection Time: 02/03/16 12:43 PM  Result Value Ref Range   WBC 7.0 4.0 - 10.5 K/uL   RBC 5.03 4.22 - 5.81 MIL/uL   Hemoglobin 14.3 13.0 - 17.0 g/dL   HCT 43.5 39.0 - 52.0 %   MCV 86.5 78.0 - 100.0 fL   MCH 28.4 26.0 - 34.0 pg   MCHC 32.9 30.0 - 36.0 g/dL   RDW 15.2 11.5 - 15.5 %   Platelets 172 150 - 400 K/uL  B Nat Peptide     Status: None   Collection Time: 02/03/16 12:43 PM  Result Value Ref Range   B Natriuretic Peptide 66.1 0.0 - 100.0 pg/mL  INR/PT     Status: Abnormal   Collection Time: 02/03/16 12:43 PM  Result Value Ref Range   Prothrombin Time 24.5 (H) 11.4 - 15.2 seconds   INR 2.17   Aerobic Culture (superficial specimen)     Status: None   Collection Time: 02/03/16  3:32 PM  Result Value Ref Range   Specimen Description WOUND ABDOMEN    Special Requests DRIVE LINE SITE    Gram Stain      FEW WBC PRESENT, PREDOMINANTLY PMN NO ORGANISMS SEEN    Culture RARE SERRATIA SPECIES    Report Status 02/06/2016 FINAL    Organism ID, Bacteria SERRATIA SPECIES        Susceptibility   Serratia species - MIC*    CEFAZOLIN >=64 RESISTANT Resistant     CEFEPIME <=1 SENSITIVE Sensitive     CEFTAZIDIME <=1 SENSITIVE Sensitive     CEFTRIAXONE <=1 SENSITIVE Sensitive     CIPROFLOXACIN 1 SENSITIVE Sensitive     GENTAMICIN <=1 SENSITIVE Sensitive     TRIMETH/SULFA 40 SENSITIVE Sensitive     * RARE SERRATIA SPECIES  TSH     Status: None   Collection Time: 02/05/16  3:36 PM  Result Value Ref Range   TSH 1.422 0.350 - 4.500 uIU/mL    Comment: Performed by a 3rd Generation assay with a functional sensitivity of <=0.01 uIU/mL.  T4, free     Status: None   Collection Time: 02/05/16  3:36 PM  Result Value Ref Range   Free T4 0.95 0.61 - 1.12 ng/dL    Comment: (NOTE) Biotin ingestion may interfere with free T4 tests. If the results are inconsistent with the TSH level, previous test results, or the clinical presentation, then consider biotin interference. If needed, order repeat testing after stopping biotin.   T3, free     Status: None   Collection Time: 02/05/16  3:36 PM  Result Value Ref Range   T3, Free 2.6 2.0 - 4.4 pg/mL    Comment: (NOTE) Performed At: [facility], [place] [id number] Lindon Romp MD BS:[id number]   Lactate dehydrogenase     Status: Abnormal   Collection Time: 02/13/16 10:49 AM  Result Value Ref Range   LDH 209 (H) 98 - 192 U/L  Protime-INR     Status: Abnormal   Collection Time: 02/13/16 10:49 AM  Result Value Ref Range   Prothrombin Time 20.2 (H) 11.4 - 15.2 seconds   INR 1.70   CBC with Differential     Status: Abnormal   Collection Time: 02/13/16  10:49 AM  Result Value Ref Range   WBC 6.5 4.0 - 10.5 K/uL   RBC 4.70 4.22 - 5.81 MIL/uL   Hemoglobin 13.2 13.0 - 17.0 g/dL   HCT 40.5 39.0 - 52.0 %   MCV 86.2 78.0 - 100.0 fL   MCH 28.1 26.0 - 34.0 pg   MCHC 32.6 30.0 - 36.0 g/dL   RDW 15.0 11.5 - 15.5 %   Platelets 133 (L) 150 - 400 K/uL   Neutrophils Relative % 67 %   Neutro Abs 4.3  1.7 - 7.7 K/uL   Lymphocytes Relative 24 %   Lymphs Abs 1.6 0.7 - 4.0 K/uL   Monocytes Relative 8 %   Monocytes Absolute 0.5 0.1 - 1.0 K/uL   Eosinophils Relative 1 %   Eosinophils Absolute 0.1 0.0 - 0.7 K/uL   Basophils Relative 0 %   Basophils Absolute 0.0 0.0 - 0.1 K/uL  INR/PT     Status: Abnormal   Collection Time: 02/20/16 10:22 AM  Result Value Ref Range   Prothrombin Time 29.2 (H) 11.4 - 15.2 seconds   INR 2.69   CULTURE, ROUTINE-ABSCESS     Status: None   Collection Time: 02/25/16  3:30 PM  Result Value Ref Range   Culture Moderate SERRATIA MARCESCENS    Gram Stain Moderate    Gram Stain WBC present-both PMN and Mononuclear    Gram Stain No Squamous Epithelial Cells Seen    Gram Stain No Organisms Seen    Organism ID, Bacteria SERRATIA MARCESCENS       Susceptibility   Serratia marcescens -  (no method available)    AMOX/CLAVULANIC  Resistant     CEFAZOLIN >=64 Resistant     CEFTRIAXONE <=1 Sensitive     CEFTAZIDIME <=1 Sensitive     CEFEPIME <=1 Sensitive     GENTAMICIN <=1 Sensitive     TOBRAMYCIN <=1 Sensitive     CIPROFLOXACIN 2 Intermediate     LEVOFLOXACIN 4 Intermediate     TRIMETH/SULFA* <=20 Sensitive      * NR=NOT REPORTABLE,SEE COMMENTORAL therapy:A cefazolin MIC of <32 predicts susceptibility to the oral agents cefaclor,cefdinir,cefpodoxime,cefprozil,cefuroxime,cephalexin,and loracarbef when used for therapy of uncomplicated UTIs due to E.coli,K.pneumomiae,and P.mirabilis. PARENTERAL therapy: A cefazolinMIC of >8 indicates resistance to parenteralcefazolin. An alternate test method must beperformed to confirm susceptibility to parenteralcefazolin.  INR/PT     Status: Abnormal   Collection Time: 02/25/16  3:53 PM  Result Value Ref Range   Prothrombin Time 28.6 (H) 11.4 - 15.2 seconds   INR 2.62   Protime-INR     Status: Abnormal   Collection Time: 03/09/16  6:11 PM  Result Value Ref Range   Prothrombin Time 21.1 (H) 11.4 - 15.2 seconds   INR 1.80     Lactate dehydrogenase     Status: Abnormal   Collection Time: 03/09/16  6:11 PM  Result Value Ref Range   LDH 224 (H) 98 - 192 U/L  MRSA PCR Screening     Status: None   Collection Time: 03/09/16  6:23 PM  Result Value Ref Range   MRSA by PCR NEGATIVE NEGATIVE    Comment:        The GeneXpert MRSA Assay (FDA approved for NASAL specimens only), is one component of a comprehensive MRSA colonization surveillance program. It is not intended to diagnose MRSA infection nor to guide or monitor treatment for MRSA infections.   Heparin level (unfractionated)     Status: None   Collection Time: 03/10/16  4:32 AM  Result Value Ref Range   Heparin Unfractionated 0.48 0.30 - 0.70 IU/mL    Comment:        IF HEPARIN RESULTS ARE BELOW EXPECTED VALUES, AND PATIENT DOSAGE HAS BEEN CONFIRMED, SUGGEST FOLLOW UP TESTING OF ANTITHROMBIN III LEVELS.   Basic metabolic panel     Status: Abnormal   Collection Time: 03/10/16  4:32 AM  Result Value Ref Range   Sodium 138 135 - 145 mmol/L   Potassium 3.6 3.5 - 5.1 mmol/L   Chloride 105 101 - 111 mmol/L   CO2 27 22 - 32 mmol/L   Glucose, Bld 88 65 - 99 mg/dL   BUN 9 6 - 20 mg/dL   Creatinine, Ser 1.00 0.61 - 1.24 mg/dL   Calcium 8.5 (L) 8.9 - 10.3 mg/dL   GFR calc non Af Amer >60 >60 mL/min   GFR calc Af Amer >60 >60 mL/min    Comment: (NOTE) The eGFR has been calculated using the CKD EPI equation. This calculation has not been validated in all clinical situations. eGFR's persistently <60 mL/min signify possible Chronic Kidney Disease.    Anion gap 6 5 - 15  CBC     Status: Abnormal   Collection Time: 03/10/16  4:32 AM  Result Value Ref Range   WBC 6.4 4.0 - 10.5 K/uL   RBC 4.60 4.22 - 5.81 MIL/uL   Hemoglobin 13.1 13.0 - 17.0 g/dL   HCT 39.1 39.0 - 52.0 %   MCV 85.0 78.0 - 100.0 fL   MCH 28.5 26.0 - 34.0 pg   MCHC 33.5 30.0 - 36.0 g/dL   RDW 15.0 11.5 - 15.5 %   Platelets 146 (L) 150 - 400 K/uL  Lactate dehydrogenase     Status:  Abnormal   Collection Time: 03/10/16  4:32 AM  Result Value Ref Range   LDH 194 (H) 98 - 192 U/L  Protime-INR     Status: Abnormal   Collection Time: 03/10/16  4:32 AM  Result Value Ref Range   Prothrombin Time 19.6 (H) 11.4 - 15.2 seconds   INR 1.63   Heparin level (unfractionated)     Status: Abnormal   Collection Time: 03/11/16  3:23 AM  Result Value Ref Range   Heparin Unfractionated 0.85 (H) 0.30 - 0.70 IU/mL    Comment:        IF HEPARIN RESULTS ARE BELOW EXPECTED VALUES, AND PATIENT DOSAGE HAS BEEN CONFIRMED, SUGGEST FOLLOW UP TESTING OF ANTITHROMBIN III LEVELS.   CBC     Status: None   Collection Time: 03/11/16  3:23 AM  Result Value Ref Range   WBC 7.9 4.0 - 10.5 K/uL   RBC 5.03 4.22 - 5.81 MIL/uL   Hemoglobin 14.2 13.0 - 17.0 g/dL   HCT 42.6 39.0 - 52.0 %   MCV 84.7 78.0 - 100.0 fL   MCH 28.2 26.0 - 34.0 pg   MCHC 33.3 30.0 - 36.0 g/dL   RDW 15.1 11.5 - 15.5 %   Platelets 180 150 - 400 K/uL  Lactate dehydrogenase     Status: Abnormal   Collection Time: 03/11/16  3:23 AM  Result Value Ref Range   LDH 217 (H) 98 - 192 U/L  Protime-INR     Status: Abnormal   Collection Time: 03/11/16  3:23 AM  Result Value Ref Range   Prothrombin Time 16.3 (H) 11.4 - 15.2 seconds   INR 1.30   Heparin level (unfractionated)     Status: None  Collection Time: 03/11/16 10:56 AM  Result Value Ref Range   Heparin Unfractionated 0.62 0.30 - 0.70 IU/mL    Comment:        IF HEPARIN RESULTS ARE BELOW EXPECTED VALUES, AND PATIENT DOSAGE HAS BEEN CONFIRMED, SUGGEST FOLLOW UP TESTING OF ANTITHROMBIN III LEVELS.   Type and screen [place]     Status: None   Collection Time: 03/11/16  8:58 PM  Result Value Ref Range   ABO/RH(D) O POS    Antibody Screen NEG    Sample Expiration 03/14/2016   Surgical PCR screen     Status: None   Collection Time: 03/11/16 11:40 PM  Result Value Ref Range   MRSA, PCR NEGATIVE NEGATIVE   Staphylococcus aureus NEGATIVE NEGATIVE      Comment:        The Xpert SA Assay (FDA approved for NASAL specimens in patients over 87 years of age), is one component of a comprehensive surveillance program.  Test performance has been validated by [facility] for patients greater than or equal to 18 year old. It is not intended to diagnose infection nor to guide or monitor treatment.   Lactate dehydrogenase     Status: None   Collection Time: 03/12/16  2:13 AM  Result Value Ref Range   LDH 176 98 - 192 U/L  Protime-INR     Status: Abnormal   Collection Time: 03/12/16  2:13 AM  Result Value Ref Range   Prothrombin Time 15.5 (H) 11.4 - 15.2 seconds   INR 1.22   CBC     Status: None   Collection Time: 03/12/16  2:13 AM  Result Value Ref Range   WBC 7.5 4.0 - 10.5 K/uL   RBC 4.71 4.22 - 5.81 MIL/uL   Hemoglobin 13.4 13.0 - 17.0 g/dL   HCT 40.2 39.0 - 52.0 %   MCV 85.4 78.0 - 100.0 fL   MCH 28.5 26.0 - 34.0 pg   MCHC 33.3 30.0 - 36.0 g/dL   RDW 15.1 11.5 - 15.5 %   Platelets 161 150 - 400 K/uL  APTT     Status: Abnormal   Collection Time: 03/12/16  2:13 AM  Result Value Ref Range   aPTT 140 (H) 24 - 36 seconds    Comment:        IF BASELINE aPTT IS ELEVATED, SUGGEST PATIENT RISK ASSESSMENT BE USED TO DETERMINE APPROPRIATE ANTICOAGULANT THERAPY.   Blood gas, arterial     Status: None   Collection Time: 03/12/16  3:26 AM  Result Value Ref Range   pH, Arterial 7.404 7.350 - 7.450   pCO2 arterial 40.7 32.0 - 48.0 mmHg   pO2, Arterial 84.2 83.0 - 108.0 mmHg   Bicarbonate 24.9 20.0 - 28.0 mmol/L   Acid-Base Excess 0.7 0.0 - 2.0 mmol/L   O2 Saturation 95.8 %   Patient temperature 98.6    Collection site RIGHT RADIAL    Drawn by [id number]    Sample type ARTERIAL DRAW    Allens test (pass/fail) PASS PASS  Urinalysis, Routine w reflex microscopic     Status: None   Collection Time: 03/12/16  5:21 AM  Result Value Ref Range   Color, Urine YELLOW YELLOW   APPearance CLEAR CLEAR   Specific Gravity, Urine 1.011 1.005  - 1.030   pH 6.0 5.0 - 8.0   Glucose, UA NEGATIVE NEGATIVE mg/dL   Hgb urine dipstick NEGATIVE NEGATIVE   Bilirubin Urine NEGATIVE NEGATIVE   Ketones, ur  NEGATIVE NEGATIVE mg/dL   Protein, ur NEGATIVE NEGATIVE mg/dL   Nitrite NEGATIVE NEGATIVE   Leukocytes, UA NEGATIVE NEGATIVE  Anaerobic culture     Status: None   Collection Time: 03/12/16  8:18 AM  Result Value Ref Range   Specimen Description WOUND ABDOMEN    Special Requests SPEC A    Culture NO ANAEROBES ISOLATED    Report Status 03/17/2016 FINAL   Aerobic Culture (superficial specimen)     Status: None   Collection Time: 03/12/16  8:18 AM  Result Value Ref Range   Specimen Description WOUND ABDOMEN    Special Requests SPEC A    Gram Stain      ABUNDANT WBC PRESENT, PREDOMINANTLY PMN NO ORGANISMS SEEN    Culture      FEW SERRATIA MARCESCENS CRITICAL RESULT CALLED TO, READ BACK BY AND VERIFIED WITH: RN K.JOHNSON [id number] MLM    Report Status 03/14/2016 FINAL    Organism ID, Bacteria SERRATIA MARCESCENS       Susceptibility   Serratia marcescens - MIC*    CEFAZOLIN >=64 RESISTANT Resistant     CEFEPIME <=1 SENSITIVE Sensitive     CEFTAZIDIME <=1 SENSITIVE Sensitive     CEFTRIAXONE <=1 SENSITIVE Sensitive     CIPROFLOXACIN 2 INTERMEDIATE Intermediate     GENTAMICIN <=1 SENSITIVE Sensitive     TRIMETH/SULFA <=20 SENSITIVE Sensitive     * FEW SERRATIA MARCESCENS  Aerobic/Anaerobic Culture (surgical/deep wound)     Status: None   Collection Time: 03/12/16  8:24 AM  Result Value Ref Range   Specimen Description TISSUE ABDOMEN    Special Requests SPEC B    Gram Stain      FEW WBC PRESENT,BOTH PMN AND MONONUCLEAR NO ORGANISMS SEEN    Culture      FEW SERRATIA MARCESCENS CRITICAL RESULT CALLED TO, READ BACK BY AND VERIFIED WITH: RN K.JOHNSON [id number] MLM NO ANAEROBES ISOLATED    Report Status 03/17/2016 FINAL    Organism ID, Bacteria SERRATIA MARCESCENS       Susceptibility   Serratia marcescens - MIC*     CEFAZOLIN >=64 RESISTANT Resistant     CEFEPIME <=1 SENSITIVE Sensitive     CEFTAZIDIME <=1 SENSITIVE Sensitive     CEFTRIAXONE <=1 SENSITIVE Sensitive     CIPROFLOXACIN 2 INTERMEDIATE Intermediate     GENTAMICIN <=1 SENSITIVE Sensitive     TRIMETH/SULFA <=20 SENSITIVE Sensitive     * FEW SERRATIA MARCESCENS  Heparin level (unfractionated)     Status: None   Collection Time: 03/12/16  9:34 PM  Result Value Ref Range   Heparin Unfractionated 0.30 0.30 - 0.70 IU/mL    Comment:        IF HEPARIN RESULTS ARE BELOW EXPECTED VALUES, AND PATIENT DOSAGE HAS BEEN CONFIRMED, SUGGEST FOLLOW UP TESTING OF ANTITHROMBIN III LEVELS.   Lactate dehydrogenase     Status: None   Collection Time: 03/13/16  1:35 AM  Result Value Ref Range   LDH 179 98 - 192 U/L  Protime-INR     Status: Abnormal   Collection Time: 03/13/16  1:35 AM  Result Value Ref Range   Prothrombin Time 15.4 (H) 11.4 - 15.2 seconds   INR 1.22   CBC     Status: Abnormal   Collection Time: 03/13/16  1:35 AM  Result Value Ref Range   WBC 8.4 4.0 - 10.5 K/uL   RBC 4.70 4.22 - 5.81 MIL/uL   Hemoglobin 13.3 13.0 - 17.0 g/dL  HCT 40.0 39.0 - 52.0 %   MCV 85.1 78.0 - 100.0 fL   MCH 28.3 26.0 - 34.0 pg   MCHC 33.3 30.0 - 36.0 g/dL   RDW 15.4 11.5 - 15.5 %   Platelets 148 (L) 150 - 400 K/uL  Basic metabolic panel     Status: Abnormal   Collection Time: 03/13/16  1:35 AM  Result Value Ref Range   Sodium 134 (L) 135 - 145 mmol/L   Potassium 3.7 3.5 - 5.1 mmol/L   Chloride 101 101 - 111 mmol/L   CO2 26 22 - 32 mmol/L   Glucose, Bld 112 (H) 65 - 99 mg/dL   BUN 9 6 - 20 mg/dL   Creatinine, Ser 1.13 0.61 - 1.24 mg/dL   Calcium 8.6 (L) 8.9 - 10.3 mg/dL   GFR calc non Af Amer >60 >60 mL/min   GFR calc Af Amer >60 >60 mL/min    Comment: (NOTE) The eGFR has been calculated using the CKD EPI equation. This calculation has not been validated in all clinical situations. eGFR's persistently <60 mL/min signify possible Chronic  Kidney Disease.    Anion gap 7 5 - 15  Heparin level (unfractionated)     Status: Abnormal   Collection Time: 03/13/16  8:04 AM  Result Value Ref Range   Heparin Unfractionated 0.25 (L) 0.30 - 0.70 IU/mL    Comment:        IF HEPARIN RESULTS ARE BELOW EXPECTED VALUES, AND PATIENT DOSAGE HAS BEEN CONFIRMED, SUGGEST FOLLOW UP TESTING OF ANTITHROMBIN III LEVELS.   Heparin level (unfractionated)     Status: Abnormal   Collection Time: 03/13/16 10:36 PM  Result Value Ref Range   Heparin Unfractionated 0.20 (L) 0.30 - 0.70 IU/mL    Comment:        IF HEPARIN RESULTS ARE BELOW EXPECTED VALUES, AND PATIENT DOSAGE HAS BEEN CONFIRMED, SUGGEST FOLLOW UP TESTING OF ANTITHROMBIN III LEVELS.   Lactate dehydrogenase     Status: None   Collection Time: 03/14/16  2:02 AM  Result Value Ref Range   LDH 192 98 - 192 U/L  Protime-INR     Status: Abnormal   Collection Time: 03/14/16  2:02 AM  Result Value Ref Range   Prothrombin Time 15.4 (H) 11.4 - 15.2 seconds   INR 1.21   CBC     Status: Abnormal   Collection Time: 03/14/16  2:02 AM  Result Value Ref Range   WBC 8.9 4.0 - 10.5 K/uL   RBC 4.63 4.22 - 5.81 MIL/uL   Hemoglobin 13.1 13.0 - 17.0 g/dL   HCT 40.0 39.0 - 52.0 %   MCV 86.4 78.0 - 100.0 fL   MCH 28.3 26.0 - 34.0 pg   MCHC 32.8 30.0 - 36.0 g/dL   RDW 15.4 11.5 - 15.5 %   Platelets 149 (L) 150 - 400 K/uL  Comprehensive metabolic panel     Status: Abnormal   Collection Time: 03/14/16  2:02 AM  Result Value Ref Range   Sodium 135 135 - 145 mmol/L   Potassium 3.9 3.5 - 5.1 mmol/L   Chloride 102 101 - 111 mmol/L   CO2 22 22 - 32 mmol/L   Glucose, Bld 95 65 - 99 mg/dL   BUN 12 6 - 20 mg/dL   Creatinine, Ser 1.21 0.61 - 1.24 mg/dL   Calcium 8.8 (L) 8.9 - 10.3 mg/dL   Total Protein 6.5 6.5 - 8.1 g/dL   Albumin 3.6 3.5 - 5.0 g/dL  AST 15 15 - 41 U/L   ALT 13 (L) 17 - 63 U/L   Alkaline Phosphatase 79 38 - 126 U/L   Total Bilirubin 0.6 0.3 - 1.2 mg/dL   GFR calc non Af Amer  >60 >60 mL/min   GFR calc Af Amer >60 >60 mL/min    Comment: (NOTE) The eGFR has been calculated using the CKD EPI equation. This calculation has not been validated in all clinical situations. eGFR's persistently <60 mL/min signify possible Chronic Kidney Disease.    Anion gap 11 5 - 15  Heparin level (unfractionated)     Status: Abnormal   Collection Time: 03/14/16  2:02 AM  Result Value Ref Range   Heparin Unfractionated 0.17 (L) 0.30 - 0.70 IU/mL    Comment:        IF HEPARIN RESULTS ARE BELOW EXPECTED VALUES, AND PATIENT DOSAGE HAS BEEN CONFIRMED, SUGGEST FOLLOW UP TESTING OF ANTITHROMBIN III LEVELS.   Lactate dehydrogenase     Status: None   Collection Time: 03/15/16  3:53 AM  Result Value Ref Range   LDH 174 98 - 192 U/L  CBC     Status: Abnormal   Collection Time: 03/15/16  3:53 AM  Result Value Ref Range   WBC 7.9 4.0 - 10.5 K/uL   RBC 4.46 4.22 - 5.81 MIL/uL   Hemoglobin 12.8 (L) 13.0 - 17.0 g/dL   HCT 38.5 (L) 39.0 - 52.0 %   MCV 86.3 78.0 - 100.0 fL   MCH 28.7 26.0 - 34.0 pg   MCHC 33.2 30.0 - 36.0 g/dL   RDW 15.6 (H) 11.5 - 15.5 %   Platelets 145 (L) 150 - 400 K/uL  Basic metabolic panel     Status: Abnormal   Collection Time: 03/15/16  3:53 AM  Result Value Ref Range   Sodium 136 135 - 145 mmol/L   Potassium 4.0 3.5 - 5.1 mmol/L   Chloride 105 101 - 111 mmol/L   CO2 24 22 - 32 mmol/L   Glucose, Bld 91 65 - 99 mg/dL   BUN 10 6 - 20 mg/dL   Creatinine, Ser 1.02 0.61 - 1.24 mg/dL   Calcium 8.8 (L) 8.9 - 10.3 mg/dL   GFR calc non Af Amer >60 >60 mL/min   GFR calc Af Amer >60 >60 mL/min    Comment: (NOTE) The eGFR has been calculated using the CKD EPI equation. This calculation has not been validated in all clinical situations. eGFR's persistently <60 mL/min signify possible Chronic Kidney Disease.    Anion gap 7 5 - 15  Heparin level (unfractionated)     Status: Abnormal   Collection Time: 03/15/16  4:00 AM  Result Value Ref Range   Heparin  Unfractionated 0.13 (L) 0.30 - 0.70 IU/mL    Comment:        IF HEPARIN RESULTS ARE BELOW EXPECTED VALUES, AND PATIENT DOSAGE HAS BEEN CONFIRMED, SUGGEST FOLLOW UP TESTING OF ANTITHROMBIN III LEVELS.   Protime-INR     Status: Abnormal   Collection Time: 03/15/16  4:00 AM  Result Value Ref Range   Prothrombin Time 15.4 (H) 11.4 - 15.2 seconds   INR 1.22   Lactate dehydrogenase     Status: None   Collection Time: 03/16/16  2:09 AM  Result Value Ref Range   LDH 179 98 - 192 U/L  CBC     Status: Abnormal   Collection Time: 03/16/16  2:09 AM  Result Value Ref Range   WBC 7.9 4.0 -  10.5 K/uL   RBC 4.37 4.22 - 5.81 MIL/uL   Hemoglobin 12.4 (L) 13.0 - 17.0 g/dL   HCT 37.8 (L) 39.0 - 52.0 %   MCV 86.5 78.0 - 100.0 fL   MCH 28.4 26.0 - 34.0 pg   MCHC 32.8 30.0 - 36.0 g/dL   RDW 15.8 (H) 11.5 - 15.5 %   Platelets 137 (L) 150 - 400 K/uL  Basic metabolic panel     Status: Abnormal   Collection Time: 03/16/16  2:09 AM  Result Value Ref Range   Sodium 137 135 - 145 mmol/L   Potassium 4.3 3.5 - 5.1 mmol/L   Chloride 105 101 - 111 mmol/L   CO2 25 22 - 32 mmol/L   Glucose, Bld 88 65 - 99 mg/dL   BUN 10 6 - 20 mg/dL   Creatinine, Ser 1.03 0.61 - 1.24 mg/dL   Calcium 8.8 (L) 8.9 - 10.3 mg/dL   GFR calc non Af Amer >60 >60 mL/min   GFR calc Af Amer >60 >60 mL/min    Comment: (NOTE) The eGFR has been calculated using the CKD EPI equation. This calculation has not been validated in all clinical situations. eGFR's persistently <60 mL/min signify possible Chronic Kidney Disease.    Anion gap 7 5 - 15  Heparin level (unfractionated)     Status: Abnormal   Collection Time: 03/16/16  2:09 AM  Result Value Ref Range   Heparin Unfractionated <0.10 (L) 0.30 - 0.70 IU/mL    Comment:        IF HEPARIN RESULTS ARE BELOW EXPECTED VALUES, AND PATIENT DOSAGE HAS BEEN CONFIRMED, SUGGEST FOLLOW UP TESTING OF ANTITHROMBIN III LEVELS.   Protime-INR     Status: Abnormal   Collection Time: 03/16/16   2:09 AM  Result Value Ref Range   Prothrombin Time 16.2 (H) 11.4 - 15.2 seconds   INR 1.29   Lactate dehydrogenase     Status: None   Collection Time: 03/17/16  4:20 AM  Result Value Ref Range   LDH 168 98 - 192 U/L  Protime-INR     Status: Abnormal   Collection Time: 03/17/16  4:20 AM  Result Value Ref Range   Prothrombin Time 16.9 (H) 11.4 - 15.2 seconds   INR 1.36   CBC     Status: Abnormal   Collection Time: 03/17/16  4:20 AM  Result Value Ref Range   WBC 8.1 4.0 - 10.5 K/uL   RBC 4.55 4.22 - 5.81 MIL/uL   Hemoglobin 12.7 (L) 13.0 - 17.0 g/dL   HCT 39.7 39.0 - 52.0 %   MCV 87.3 78.0 - 100.0 fL   MCH 27.9 26.0 - 34.0 pg   MCHC 32.0 30.0 - 36.0 g/dL   RDW 15.8 (H) 11.5 - 15.5 %   Platelets 138 (L) 150 - 400 K/uL  Basic metabolic panel     Status: None   Collection Time: 03/17/16  4:20 AM  Result Value Ref Range   Sodium 138 135 - 145 mmol/L   Potassium 4.0 3.5 - 5.1 mmol/L   Chloride 105 101 - 111 mmol/L   CO2 25 22 - 32 mmol/L   Glucose, Bld 93 65 - 99 mg/dL   BUN 12 6 - 20 mg/dL   Creatinine, Ser 0.95 0.61 - 1.24 mg/dL   Calcium 8.9 8.9 - 10.3 mg/dL   GFR calc non Af Amer >60 >60 mL/min   GFR calc Af Amer >60 >60 mL/min    Comment: (NOTE)  The eGFR has been calculated using the CKD EPI equation. This calculation has not been validated in all clinical situations. eGFR's persistently <60 mL/min signify possible Chronic Kidney Disease.    Anion gap 8 5 - 15  Heparin level (unfractionated)     Status: Abnormal   Collection Time: 03/17/16  4:20 AM  Result Value Ref Range   Heparin Unfractionated 0.17 (L) 0.30 - 0.70 IU/mL    Comment:        IF HEPARIN RESULTS ARE BELOW EXPECTED VALUES, AND PATIENT DOSAGE HAS BEEN CONFIRMED, SUGGEST FOLLOW UP TESTING OF ANTITHROMBIN III LEVELS.   Heparin level (unfractionated)     Status: Abnormal   Collection Time: 03/18/16  3:57 AM  Result Value Ref Range   Heparin Unfractionated 0.22 (L) 0.30 - 0.70 IU/mL    Comment:         IF HEPARIN RESULTS ARE BELOW EXPECTED VALUES, AND PATIENT DOSAGE HAS BEEN CONFIRMED, SUGGEST FOLLOW UP TESTING OF ANTITHROMBIN III LEVELS.   CBC     Status: Abnormal   Collection Time: 03/18/16  3:57 AM  Result Value Ref Range   WBC 7.3 4.0 - 10.5 K/uL   RBC 4.58 4.22 - 5.81 MIL/uL   Hemoglobin 13.1 13.0 - 17.0 g/dL   HCT 39.5 39.0 - 52.0 %   MCV 86.2 78.0 - 100.0 fL   MCH 28.6 26.0 - 34.0 pg   MCHC 33.2 30.0 - 36.0 g/dL   RDW 15.8 (H) 11.5 - 15.5 %   Platelets 135 (L) 150 - 400 K/uL  Lactate dehydrogenase     Status: None   Collection Time: 03/18/16  3:57 AM  Result Value Ref Range   LDH 171 98 - 192 U/L  Protime-INR     Status: Abnormal   Collection Time: 03/18/16  3:57 AM  Result Value Ref Range   Prothrombin Time 18.0 (H) 11.4 - 15.2 seconds   INR 1.47   Lactate dehydrogenase     Status: None   Collection Time: 03/19/16  5:00 AM  Result Value Ref Range   LDH 179 98 - 192 U/L  Protime-INR     Status: Abnormal   Collection Time: 03/19/16  5:00 AM  Result Value Ref Range   Prothrombin Time 19.8 (H) 11.4 - 15.2 seconds   INR 1.66   CBC     Status: Abnormal   Collection Time: 03/19/16  5:00 AM  Result Value Ref Range   WBC 8.3 4.0 - 10.5 K/uL   RBC 4.71 4.22 - 5.81 MIL/uL   Hemoglobin 13.2 13.0 - 17.0 g/dL   HCT 41.1 39.0 - 52.0 %   MCV 87.3 78.0 - 100.0 fL   MCH 28.0 26.0 - 34.0 pg   MCHC 32.1 30.0 - 36.0 g/dL   RDW 15.9 (H) 11.5 - 15.5 %   Platelets 147 (L) 150 - 400 K/uL  Heparin level (unfractionated)     Status: Abnormal   Collection Time: 03/19/16  5:00 AM  Result Value Ref Range   Heparin Unfractionated 0.22 (L) 0.30 - 0.70 IU/mL    Comment:        IF HEPARIN RESULTS ARE BELOW EXPECTED VALUES, AND PATIENT DOSAGE HAS BEEN CONFIRMED, SUGGEST FOLLOW UP TESTING OF ANTITHROMBIN III LEVELS.   Lactate dehydrogenase     Status: Abnormal   Collection Time: 03/20/16  4:13 AM  Result Value Ref Range   LDH 195 (H) 98 - 192 U/L  Protime-INR     Status:  Abnormal  Collection Time: 03/20/16  4:13 AM  Result Value Ref Range   Prothrombin Time 22.0 (H) 11.4 - 15.2 seconds   INR 1.90   CBC     Status: Abnormal   Collection Time: 03/20/16  4:13 AM  Result Value Ref Range   WBC 7.7 4.0 - 10.5 K/uL   RBC 4.82 4.22 - 5.81 MIL/uL   Hemoglobin 13.5 13.0 - 17.0 g/dL   HCT 41.4 39.0 - 52.0 %   MCV 85.9 78.0 - 100.0 fL   MCH 28.0 26.0 - 34.0 pg   MCHC 32.6 30.0 - 36.0 g/dL   RDW 15.7 (H) 11.5 - 15.5 %   Platelets 161 150 - 400 K/uL  Heparin level (unfractionated)     Status: Abnormal   Collection Time: 03/20/16  4:13 AM  Result Value Ref Range   Heparin Unfractionated 0.25 (L) 0.30 - 0.70 IU/mL    Comment:        IF HEPARIN RESULTS ARE BELOW EXPECTED VALUES, AND PATIENT DOSAGE HAS BEEN CONFIRMED, SUGGEST FOLLOW UP TESTING OF ANTITHROMBIN III LEVELS.   Basic metabolic panel     Status: Abnormal   Collection Time: 03/20/16  4:13 AM  Result Value Ref Range   Sodium 136 135 - 145 mmol/L   Potassium 4.2 3.5 - 5.1 mmol/L   Chloride 103 101 - 111 mmol/L   CO2 20 (L) 22 - 32 mmol/L   Glucose, Bld 85 65 - 99 mg/dL   BUN 13 6 - 20 mg/dL   Creatinine, Ser 1.02 0.61 - 1.24 mg/dL   Calcium 9.2 8.9 - 10.3 mg/dL   GFR calc non Af Amer >60 >60 mL/min   GFR calc Af Amer >60 >60 mL/min    Comment: (NOTE) The eGFR has been calculated using the CKD EPI equation. This calculation has not been validated in all clinical situations. eGFR's persistently <60 mL/min signify possible Chronic Kidney Disease.    Anion gap 13 5 - 15  POCT INR     Status: None   Collection Time: 03/23/16 12:00 AM  Result Value Ref Range   INR 1.6   CBC w/Diff/Platelet     Status: None   Collection Time: 03/24/16  1:48 PM  Result Value Ref Range   WBC 7.3 4.0 - 10.5 K/uL   RBC 4.83 4.22 - 5.81 MIL/uL   Hemoglobin 13.7 13.0 - 17.0 g/dL   HCT 41.8 39.0 - 52.0 %   MCV 86.5 78.0 - 100.0 fL   MCH 28.4 26.0 - 34.0 pg   MCHC 32.8 30.0 - 36.0 g/dL   RDW 15.0 11.5 - 15.5 %    Platelets 177 150 - 400 K/uL   Neutrophils Relative % 62 %   Neutro Abs 4.5 1.7 - 7.7 K/uL   Lymphocytes Relative 27 %   Lymphs Abs 2.0 0.7 - 4.0 K/uL   Monocytes Relative 9 %   Monocytes Absolute 0.6 0.1 - 1.0 K/uL   Eosinophils Relative 2 %   Eosinophils Absolute 0.1 0.0 - 0.7 K/uL   Basophils Relative 0 %   Basophils Absolute 0.0 0.0 - 0.1 K/uL  Lactate Dehydrogenase (LDH)     Status: Abnormal   Collection Time: 03/24/16  1:48 PM  Result Value Ref Range   LDH 230 (H) 98 - 192 U/L  INR/PT     Status: Abnormal   Collection Time: 03/24/16  1:48 PM  Result Value Ref Range   Prothrombin Time 19.6 (H) 11.4 - 15.2 seconds  INR 1.63   B Nat Peptide     Status: None   Collection Time: 03/24/16  1:48 PM  Result Value Ref Range   B Natriuretic Peptide 37.6 0.0 - 100.0 pg/mL  Comprehensive Metabolic Panel (CMET)     Status: None   Collection Time: 03/24/16  1:51 PM  Result Value Ref Range   Sodium 139 135 - 145 mmol/L   Potassium 3.9 3.5 - 5.1 mmol/L    Comment: SLIGHT HEMOLYSIS   Chloride 107 101 - 111 mmol/L   CO2 25 22 - 32 mmol/L   Glucose, Bld 96 65 - 99 mg/dL   BUN 10 6 - 20 mg/dL   Creatinine, Ser 1.14 0.61 - 1.24 mg/dL   Calcium 9.1 8.9 - 10.3 mg/dL   Total Protein 7.1 6.5 - 8.1 g/dL   Albumin 4.1 3.5 - 5.0 g/dL   AST 25 15 - 41 U/L   ALT 20 17 - 63 U/L   Alkaline Phosphatase 90 38 - 126 U/L   Total Bilirubin 0.8 0.3 - 1.2 mg/dL   GFR calc non Af Amer >60 >60 mL/min   GFR calc Af Amer >60 >60 mL/min    Comment: (NOTE) The eGFR has been calculated using the CKD EPI equation. This calculation has not been validated in all clinical situations. eGFR's persistently <60 mL/min signify possible Chronic Kidney Disease.    Anion gap 7 5 - 15  Protime-INR     Status: Abnormal   Collection Time: 03/25/16  2:45 AM  Result Value Ref Range   Prothrombin Time 21.2 (H) 11.4 - 15.2 seconds   INR 1.80   CBC     Status: Abnormal   Collection Time: 03/25/16  2:45 AM  Result  Value Ref Range   WBC 7.8 4.0 - 10.5 K/uL   RBC 4.43 4.22 - 5.81 MIL/uL   Hemoglobin 12.6 (L) 13.0 - 17.0 g/dL   HCT 38.4 (L) 39.0 - 52.0 %   MCV 86.7 78.0 - 100.0 fL   MCH 28.4 26.0 - 34.0 pg   MCHC 32.8 30.0 - 36.0 g/dL   RDW 15.1 11.5 - 15.5 %   Platelets 173 150 - 400 K/uL  CBC     Status: Abnormal   Collection Time: 03/26/16 11:20 AM  Result Value Ref Range   WBC 7.2 4.0 - 10.5 K/uL   RBC 4.28 4.22 - 5.81 MIL/uL   Hemoglobin 12.3 (L) 13.0 - 17.0 g/dL   HCT 37.0 (L) 39.0 - 52.0 %   MCV 86.4 78.0 - 100.0 fL   MCH 28.7 26.0 - 34.0 pg   MCHC 33.2 30.0 - 36.0 g/dL   RDW 15.2 11.5 - 15.5 %   Platelets 166 150 - 400 K/uL  INR/PT     Status: Abnormal   Collection Time: 03/26/16 11:20 AM  Result Value Ref Range   Prothrombin Time 21.4 (H) 11.4 - 15.2 seconds   INR 1.83   Lactate Dehydrogenase (LDH)     Status: Abnormal   Collection Time: 03/26/16 11:20 AM  Result Value Ref Range   LDH 205 (H) 98 - 192 U/L  Protime-INR     Status: None   Collection Time: 03/30/16  9:23 AM  Result Value Ref Range   Prothrombin Time 13.8 11.4 - 15.2 seconds   INR 1.06   Lactate dehydrogenase     Status: None   Collection Time: 03/30/16  9:23 AM  Result Value Ref Range   LDH 176 98 -  192 U/L  Protime-INR     Status: None   Collection Time: 04/02/16  9:25 AM  Result Value Ref Range   Prothrombin Time 15.2 11.4 - 15.2 seconds   INR 1.19   Lactate dehydrogenase     Status: None   Collection Time: 04/02/16  9:25 AM  Result Value Ref Range   LDH 186 98 - 192 U/L  Lactate dehydrogenase     Status: Abnormal   Collection Time: 04/06/16  9:29 AM  Result Value Ref Range   LDH 225 (H) 98 - 192 U/L  Protime-INR     Status: Abnormal   Collection Time: 04/06/16  9:29 AM  Result Value Ref Range   Prothrombin Time 25.7 (H) 11.4 - 15.2 seconds   INR 2.30   CBC     Status: None   Collection Time: 04/13/16 10:39 AM  Result Value Ref Range   WBC 7.1 4.0 - 10.5 K/uL   RBC 4.56 4.22 - 5.81 MIL/uL    Hemoglobin 13.0 13.0 - 17.0 g/dL   HCT 39.2 39.0 - 52.0 %   MCV 86.0 78.0 - 100.0 fL   MCH 28.5 26.0 - 34.0 pg   MCHC 33.2 30.0 - 36.0 g/dL   RDW 14.6 11.5 - 15.5 %   Platelets 189 150 - 400 K/uL  Lactate Dehydrogenase (LDH)     Status: Abnormal   Collection Time: 04/13/16 10:39 AM  Result Value Ref Range   LDH 225 (H) 98 - 192 U/L  INR/PT     Status: Abnormal   Collection Time: 04/13/16 10:39 AM  Result Value Ref Range   Prothrombin Time 24.2 (H) 11.4 - 15.2 seconds   INR 2.13   Protime-INR     Status: Abnormal   Collection Time: 04/29/16  9:25 AM  Result Value Ref Range   Prothrombin Time 21.1 (H) 11.4 - 15.2 seconds   INR 1.95   Basic metabolic panel     Status: None   Collection Time: 04/29/16  9:25 AM  Result Value Ref Range   Sodium 139 135 - 145 mmol/L   Potassium 3.7 3.5 - 5.1 mmol/L   Chloride 106 101 - 111 mmol/L   CO2 25 22 - 32 mmol/L   Glucose, Bld 90 65 - 99 mg/dL   BUN 9 6 - 20 mg/dL   Creatinine, Ser 1.02 0.61 - 1.24 mg/dL   Calcium 9.1 8.9 - 10.3 mg/dL   GFR calc non Af Amer >60 >60 mL/min   GFR calc Af Amer >60 >60 mL/min    Comment: (NOTE) The eGFR has been calculated using the CKD EPI equation. This calculation has not been validated in all clinical situations. eGFR's persistently <60 mL/min signify possible Chronic Kidney Disease.    Anion gap 8 5 - 15  CBC     Status: Abnormal   Collection Time: 04/29/16  9:25 AM  Result Value Ref Range   WBC 7.0 4.0 - 10.5 K/uL   RBC 4.61 4.22 - 5.81 MIL/uL   Hemoglobin 12.6 (L) 13.0 - 17.0 g/dL   HCT 39.3 39.0 - 52.0 %   MCV 85.2 78.0 - 100.0 fL   MCH 27.3 26.0 - 34.0 pg   MCHC 32.1 30.0 - 36.0 g/dL   RDW 14.9 11.5 - 15.5 %   Platelets 150 150 - 400 K/uL  Lactate dehydrogenase     Status: Abnormal   Collection Time: 04/29/16  9:25 AM  Result Value Ref Range   LDH 232 (  H) 98 - 192 U/L    ASSESSMENT AND PLAN:   1) Chronic systolic HF, s/p HMII LVAD implant 05/2012.  -  NYHA II. - Listed 1A at [facility].  Also with visit to [facility] for dual listing.  - He is not on a BB. Intolerant BB. Volume status stable. No change for now. Continue current HF meds.     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   Changed driveline dressing using sterile technique. Reapplied ACell. Decreased wound size with decreased size and depth. Continue ceftriaxone 2 grams every 12 hours per ID. Plan for ceftriaxone at least 6 wks. PICC site ok.  - Follow up ID next week.   3) HTN-  - Maps improved today. Continue current regimen.  3) Hyperthyroidism - Amio induced. Follows with Robert. Forde [name]. Amio stopped. Recent TSH 02/2016 1.422   4) NSVT/VT  - Off amio. On mexilitene. - ICD now at EOL. No plans to replace at this time. Reviewed 04/14/16 continue current plan.  5) PVCs - Resolved.   6) Anticoagulation with coumadin  - Continue coumadin and ASA 325 mg for LVAD. No bleeding problems. INR 1.8 See anticoagulation flow sheet.    7) LVAD  - Interrogation stable.    I reviewed todays lab work. BMET, CBC, LDH, INR . INR a little low but all other labs ok. See anticoagulation flow sheet.  Follow up next week LVAD dressing change. Anticipate switching back to Aquacel AG next week.   Amy Clegg NP-C  12:15 PM

## 2016-05-04 ENCOUNTER — Telehealth: Payer: Self-pay | Admitting: Pharmacist

## 2016-05-04 ENCOUNTER — Ambulatory Visit (INDEPENDENT_AMBULATORY_CARE_PROVIDER_SITE_OTHER): Payer: Medicaid Other | Admitting: Infectious Diseases

## 2016-05-04 ENCOUNTER — Encounter: Payer: Self-pay | Admitting: Infectious Diseases

## 2016-05-04 DIAGNOSIS — N179 Acute kidney failure, unspecified: Secondary | ICD-10-CM

## 2016-05-04 DIAGNOSIS — A488 Other specified bacterial diseases: Secondary | ICD-10-CM

## 2016-05-04 DIAGNOSIS — J156 Pneumonia due to other aerobic Gram-negative bacteria: Secondary | ICD-10-CM

## 2016-05-04 NOTE — Telephone Encounter (Signed)
Called and spoke to [place], [facility] pharmacist, and extended [employer] IV abx Rocephin order x 5 more weeks until 06/08/16 per Dr. Ninetta Lights.

## 2016-05-04 NOTE — Progress Notes (Signed)
   Subjective:    Patient ID: Robert [name], male    DOB: 1955/12/25, 60 y.o.   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  Robert [name] reports continued exudative drainage from drive line site despite multiple rounds of oral antibiotics with Cipro (08/2015, 09/2015, 11/2015, and 01/2016).  He was also hospitalized in August 2017 for IV ceftriaxone followed by Cipro 500mg  BID x 30 days.  States that while on antibiotics, his drainage would improve but then would recur after completion of antibiotic course.  Never completely resolved.  He most recently underwent surgical debridement with VAC placement and wound irrigation with antibiotic solution with Dr. Donata Clay on 12/8.  Deep cultures from this revealed Serratia marcescens again with resistance to Cefazolin and intermediate resistance to Cipro.  He has been on Cefepime since 12/7.  His course was complicated by post-op bleeding. He has been getting Acell dressings to his wound and it has been decreasing in size.  His original  anbx stop date was 04-23-16.  He is 1a from both [facility] and [facility] for heart transplant.  Has been feeling well. States his wound has been decreasing in size. No problems with PIC line.  Has noted no wound d/c.    Report Status 03/17/2016 FINAL    Organism ID, Bacteria SERRATIA MARCESCENS       Susceptibility   Serratia marcescens - MIC*    CEFAZOLIN >=64 RESISTANT Resistant     CEFEPIME <=1 SENSITIVE Sensitive     CEFTAZIDIME <=1 SENSITIVE Sensitive     CEFTRIAXONE <=1 SENSITIVE Sensitive     CIPROFLOXACIN 2 INTERMEDIATE Intermediate     GENTAMICIN <=1 SENSITIVE Sensitive     TRIMETH/SULFA <=20 SENSITIVE Sensitive     * FEW SERRATIA MARCESCENS    Review of Systems  Constitutional: Negative for appetite change, chills  and fever.  Respiratory: Negative for cough and shortness of breath.   Cardiovascular: Negative for chest pain.  Gastrointestinal: Negative for constipation and diarrhea.  Genitourinary: Negative for difficulty urinating.  Neurological: Negative for dizziness and light-headedness.       Objective:   Physical Exam  Constitutional: He appears well-developed and well-nourished.  HENT:  Mouth/Throat: No oropharyngeal exudate.  Eyes: EOM are normal. Pupils are equal, round, and reactive to light.  Neck: Neck supple.  Cardiovascular:  LVAD hum  Pulmonary/Chest: Effort normal and breath sounds normal.  Abdominal: Soft. Bowel sounds are normal. There is no tenderness. There is no rebound.    Musculoskeletal: He exhibits no edema.       Arms: Lymphadenopathy:    He has no cervical adenopathy.          Assessment & Plan:

## 2016-05-04 NOTE — Assessment & Plan Note (Signed)
His last Cr is normal. This is resolved.

## 2016-05-04 NOTE — Assessment & Plan Note (Addendum)
He is doing well.  I am concerned about the length of his infection, multiple recurrences.  Will change his end date for 12 weeks total, this will make his end date in 5 more weeks.  He has had no problems with loose BM or with his PIC.  His Cr and h/h are stable.  Will see him back in 5 weeks.

## 2016-05-05 MED FILL — ENTRESTO 97 MG-103 MG TAB: 97-103 | 30 days supply | Qty: 60 | Fill #1

## 2016-05-06 ENCOUNTER — Other Ambulatory Visit (HOSPITAL_COMMUNITY): Payer: Self-pay | Admitting: *Deleted

## 2016-05-06 ENCOUNTER — Ambulatory Visit (HOSPITAL_COMMUNITY)
Admission: RE | Admit: 2016-05-06 | Discharge: 2016-05-06 | Disposition: A | Discharge status: Home / Self Care | Payer: Medicaid Other | Source: Ambulatory Visit | Attending: Internal Medicine | Admitting: Internal Medicine

## 2016-05-06 ENCOUNTER — Ambulatory Visit (HOSPITAL_COMMUNITY): Payer: Self-pay | Admitting: Pharmacist

## 2016-05-06 DIAGNOSIS — Z95811 Presence of heart assist device: Secondary | ICD-10-CM

## 2016-05-06 DIAGNOSIS — Z7901 Long term (current) use of anticoagulants: Secondary | ICD-10-CM | POA: Diagnosis not present

## 2016-05-06 DIAGNOSIS — Z79899 Other long term (current) drug therapy: Secondary | ICD-10-CM | POA: Insufficient documentation

## 2016-05-06 DIAGNOSIS — Z5181 Encounter for therapeutic drug level monitoring: Secondary | ICD-10-CM

## 2016-05-06 DIAGNOSIS — I10 Essential (primary) hypertension: Secondary | ICD-10-CM | POA: Diagnosis not present

## 2016-05-06 DIAGNOSIS — I428 Other cardiomyopathies: Secondary | ICD-10-CM | POA: Insufficient documentation

## 2016-05-06 DIAGNOSIS — I5022 Chronic systolic (congestive) heart failure: Secondary | ICD-10-CM

## 2016-05-06 DIAGNOSIS — I509 Heart failure, unspecified: Secondary | ICD-10-CM | POA: Insufficient documentation

## 2016-05-06 DIAGNOSIS — I110 Hypertensive heart disease with heart failure: Secondary | ICD-10-CM | POA: Diagnosis not present

## 2016-05-06 DIAGNOSIS — Z7982 Long term (current) use of aspirin: Secondary | ICD-10-CM | POA: Diagnosis not present

## 2016-05-06 DIAGNOSIS — Z4800 Encounter for change or removal of nonsurgical wound dressing: Secondary | ICD-10-CM | POA: Insufficient documentation

## 2016-05-06 DIAGNOSIS — A498 Other bacterial infections of unspecified site: Secondary | ICD-10-CM

## 2016-05-06 DIAGNOSIS — T829XXD Unspecified complication of cardiac and vascular prosthetic device, implant and graft, subsequent encounter: Secondary | ICD-10-CM

## 2016-05-06 LAB — PROTIME-INR
INR: 1.96
Prothrombin Time: 22.6 seconds — ABNORMAL HIGH (ref 11.4–15.2)

## 2016-05-06 MED ORDER — AMLODIPINE BESYLATE 5 MG PO TABS: 5.0000 mg | ORAL_TABLET | Freq: Every day | ORAL | 0 refills | Status: AC

## 2016-05-06 NOTE — Addendum Note (Signed)
Encounter addended by: Thayer Headings, RN on: 05/06/2016 12:11 PM<BR>    Actions taken: Sign clinical note, Order list changed, Diagnosis association updated

## 2016-05-06 NOTE — Addendum Note (Signed)
Encounter addended by: Thayer Headings, RN on: 05/06/2016  1:20 PM<BR>    Actions taken: Sign clinical note

## 2016-05-06 NOTE — Progress Notes (Signed)
Patient presents for 1 week dressing change follow up in VAD Clinic today. Reports no problems with VAD equipment or concerns with drive line.  Vital Signs:  Doppler Pressure 110   Automatc BP: 123/81 (94) HR:80   SPO2:98  %    VAD Indication: Bridge to Transplant- Evaluation completed at [facility] with IB listing 05/30/2013; dual listed with [facility] 12/04/2014.  1A [facility] &1B [facility].      VAD interrogation & Equipment Management: Speed:9400 Flow: 4.9 Power:5.6 w    PI:7.5  Alarms: no clinical alarms Events: 3-5 daily  Fixed speed 9400 Low speed limit: 8800  Primary Controller:  Replace back up battery in 50months. Back up controller:   Replace back up battery in 16 months.  Annual Equipment Maintenance on UBC/PM was performed on 06/19/2015.   I reviewed the LVAD parameters from today and compared the results to the patient's prior recorded data. LVAD interrogation was NEGATIVE for significant power changes, NEGATIVE for clinical alarms and STABLE for PI events/speed drops. No programming changes were made and pump is functioning within specified parameters. Pt is performing daily controller and system monitor self tests along with completing weekly and monthly maintenance for LVAD equipment.  LVAD equipment check completed and is in good working order. Back-up equipment present. Charged back up battery and performed self-test on equipment.   Exit Site Care: Drive line dressing changed per Tonye Becket NP today in clinic with Acell application  Significant Events on VAD Support:  08/26/15 >>Serratia marcescens (rare) >>Cipro 500 mg bid x 7 days  09/29/15 >> Serratia marcescens (abundant) >> Cipro 500 mg BID x14d 11/20/15 >> Serratia marcescens (few) Admitted IV ceftriaxone >> cipro 500 mg twice daily x 30 days 02/03/16 >> Serratia species >> Cipro 500 mg BID x 14d; referral to ID 02/13/16 03/11/2016 >> driveline debridement, IV antibiotics  Device:[employer]  Dual Therapies: on Last check: 08/05/2014 (assessed Optivol 02/05/16)   BP & Labs:  MAP110 - Doppler is reflecting map   Return to clinic in one week for dressing change and INR check.  Marcellus Scott RN VAD Coordinator   Office: [phone] 24/7 Emergency VAD Pager: [phone]

## 2016-05-06 NOTE — Addendum Note (Signed)
Encounter addended by: Thayer Headings, RN on: 05/06/2016 11:15 AM<BR>    Actions taken: Order list changed, Order Reconciliation Section accessed, Home Medications modified, Medication taking status modified

## 2016-05-06 NOTE — Progress Notes (Signed)
LVAD CLINIC NOTE  Patient ID: Robert [name], male   DOB: 17-Dec-1955, 61 y.o.   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 status post dual chamber Medtronic ICD implanted April 2011. Cath 2011 showed normal coronaries. He also has a history of VTach .   Amio recently stopped due to thyrotoxicity. Placed on prednisone and tapazole. Had VT off amio and mexilitene started.  Had [place] on 8/17 as part of dual listing process at [facility]. Normal filling pressures and cardiac output.   Drive Line Infeciton  08/26/15 >>Serratia marcescens (rare) >>Cipro 500 mg bid x 7 days  09/29/15 >> Serratia marcescens (abundant) >>Cipro 500 mg BID x14d 11/20/15 >>Serratia marcescens (few) Admitted IV ceftriaxone >>cipro 500 mg twice daily x 30 days 02/03/16 >> Serratia species >> Cipro 500 mg BID x 14d; referral to ID  03/12/2016 Surgical Debridement with VAC placement. Full thickness 5x8x6 cm. 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 Plan to continued ced on cefepime 1 gram IV every 8 hours  03/16/2016 PICC line placed for IV antibiotics.  03/17/2016 VAC reapplied with 1 gram of A Cell applied. 03/25/2016 VAC removed Damp saline dressing applied.  04/02/2016 Acell applied  04/20/2016 Acell applied.   Admitted to [facility] on 12/5 for heparin bridge and surgical debridement of persistent drive line infection. VAC applied with A cell. VAC maintained seal at 75 mm hg continuous therapy. Plan for ceftriaxone for 6 wks until 04/23/16. Will also need VAC changes weekly with ACell. [facility] following for home antibiotics.   Today he returns for LVAD follow and dressing change. Overall feeling ok. Denies SOB/PND/Orthopnea. No fever or chills. No bleeding problems. Taking all medications. Appetite ok. Continue [facility] for home  antibiotics. No PICC issues.  Denies R upper arm pain.    Recent RHC on 11/06/15 as part of Transplant process RA = 6 RV =  18/3/6 PA = 19/7 (12) PCW = 8 Fick cardiac output/index = 6.1/2.6 PVR = .0.7 WU Ao sat = 98% PA sat = 70%, 69%:  I reviewed the LVAD parameters from today, and compared the results to the patient's prior recorded data.  No programming changes were made.  The LVAD is functioning within specified parameters.  The patient performs LVAD self-test daily.  LVAD interrogation was negative for any significant power changes, alarms or PI events/speed drops.  LVAD equipment check completed and is in good working order.  Back-up equipment present.   LVAD education done on emergency procedures and precautions and reviewed exit site care.    Past Medical History:  Diagnosis Date  . AICD (automatic cardioverter/defibrillator) present 2011  . Bronchitis   . Congestive heart failure ([place]) 2011  . Hypertension 2000  . LVAD (left ventricular assist device) present ([place]) 2014  . Pneumonia   . Pulmonary hypertension     Current Outpatient Prescriptions  Medication Sig Dispense Refill  . allopurinol (ZYLOPRIM) 300 MG tablet Take 1 tablet (300 mg total) by mouth daily. 30 tablet 6  . amLODipine (NORVASC) 5 MG tablet Take 1 tablet (5 mg total) by mouth daily. 14 tablet 0  . aspirin EC 325 MG tablet Take 1 tablet (325 mg total) by mouth daily. 30 tablet 6  . hydrALAZINE (APRESOLINE) 50 MG tablet Take 0.5 tablets (25 mg  total) by mouth 2 (two) times daily. 30 tablet 6  . mexiletine (MEXITIL) 150 MG capsule Take 1 capsule (150 mg total) by mouth 2 (two) times daily. 60 capsule 5  . Multiple Vitamin (MULTIVITAMIN) tablet Take 1 tablet by mouth daily.    . pantoprazole (PROTONIX) 40 MG tablet Take 1 tablet (40 mg total) by mouth daily. 30 tablet 3  . sacubitril-valsartan (ENTRESTO) 97-103 MG Take 1 tablet by mouth 2 (two) times daily. 60 tablet 3  . traMADol (ULTRAM) 50 MG tablet Take 1  tablet (50 mg total) by mouth every 8 (eight) hours as needed for moderate pain. 30 tablet 5  . warfarin (COUMADIN) 5 MG tablet Take daily as directed by VAD clinic based on weekly INR results. 30 tablet 3  . furosemide (LASIX) 40 MG tablet Take 1 tablet (40 mg total) by mouth daily as needed. Take as directed by clinic 30 tablet 3   No current facility-administered medications for this encounter.     Metoprolol and Imdur [isosorbide nitrate]  REVIEW OF SYSTEMS: All systems negative except as listed in HPI, PMH and Problem list. Vital Signs:  Doppler Pressure 110              Automatc BP: 123/81 (94) HR:80  SPO2:98%  VAD Indication: Bridge to Transplant- Evaluation completed at [facility] with IB listing 05/30/2013; dual listed with [facility] 12/04/2014.  1A [facility] &1B [place].    VAD interrogation & Equipment Management: Speed:9400 Flow: 4.9 Power:5.6 w PI:7.5 Alarms: no clinical alarms Events: 3-5 daily Fixed speed 9400 Low speed limit: 8800 Physical Exam: GENERAL: NAD, male who presents to clinic.Walked in the clinic without difficulty HEENT: normal  NECK: Supple, JVP flat;  2+ bilaterally, no bruits.  No lymphadenopathy or thyromegaly appreciated.  CARDIAC:  Mechanical heart sounds with LVAD hum present.  LUNGS:  CTAB ABDOMEN:  Soft , NT,ND positive bowel sounds x4.     LVAD exit site: Minimal depth around driveline. No odor. Minimal exudate. Driveline has multiple twists.  EXTREMITIES:  Warm No cyanosis. RUE PICC ok. RUE non tender. Rand LLE no edema.  NEUROLOGIC:  A&O x3. Gait steady.  No aphasia.  No dysarthria.  Affect pleasant.     Recent Results (from the past 2160 hour(s))  Lactate dehydrogenase     Status: Abnormal   Collection Time: 02/13/16 10:49 AM  Result Value Ref Range   LDH 209 (H) 98 - 192 U/L  Protime-INR     Status: Abnormal   Collection Time: 02/13/16 10:49 AM  Result Value Ref Range   Prothrombin Time 20.2 (H) 11.4 - 15.2 seconds   INR 1.70     CBC with Differential     Status: Abnormal   Collection Time: 02/13/16 10:49 AM  Result Value Ref Range   WBC 6.5 4.0 - 10.5 K/uL   RBC 4.70 4.22 - 5.81 MIL/uL   Hemoglobin 13.2 13.0 - 17.0 g/dL   HCT 40.5 39.0 - 52.0 %   MCV 86.2 78.0 - 100.0 fL   MCH 28.1 26.0 - 34.0 pg   MCHC 32.6 30.0 - 36.0 g/dL   RDW 15.0 11.5 - 15.5 %   Platelets 133 (L) 150 - 400 K/uL   Neutrophils Relative % 67 %   Neutro Abs 4.3 1.7 - 7.7 K/uL   Lymphocytes Relative 24 %   Lymphs Abs 1.6 0.7 - 4.0 K/uL   Monocytes Relative 8 %   Monocytes Absolute 0.5 0.1 - 1.0 K/uL   Eosinophils Relative  1 %   Eosinophils Absolute 0.1 0.0 - 0.7 K/uL   Basophils Relative 0 %   Basophils Absolute 0.0 0.0 - 0.1 K/uL  INR/PT     Status: Abnormal   Collection Time: 02/20/16 10:22 AM  Result Value Ref Range   Prothrombin Time 29.2 (H) 11.4 - 15.2 seconds   INR 2.69   CULTURE, ROUTINE-ABSCESS     Status: None   Collection Time: 02/25/16  3:30 PM  Result Value Ref Range   Culture Moderate SERRATIA MARCESCENS    Gram Stain Moderate    Gram Stain WBC present-both PMN and Mononuclear    Gram Stain No Squamous Epithelial Cells Seen    Gram Stain No Organisms Seen    Organism ID, Bacteria SERRATIA MARCESCENS       Susceptibility   Serratia marcescens -  (no method available)    AMOX/CLAVULANIC  Resistant     CEFAZOLIN >=64 Resistant     CEFTRIAXONE <=1 Sensitive     CEFTAZIDIME <=1 Sensitive     CEFEPIME <=1 Sensitive     GENTAMICIN <=1 Sensitive     TOBRAMYCIN <=1 Sensitive     CIPROFLOXACIN 2 Intermediate     LEVOFLOXACIN 4 Intermediate     TRIMETH/SULFA* <=20 Sensitive      * NR=NOT REPORTABLE,SEE COMMENTORAL therapy:A cefazolin MIC of <32 predicts susceptibility to the oral agents cefaclor,cefdinir,cefpodoxime,cefprozil,cefuroxime,cephalexin,and loracarbef when used for therapy of uncomplicated UTIs due to E.coli,K.pneumomiae,and P.mirabilis. PARENTERAL therapy: A cefazolinMIC of >8 indicates resistance to  parenteralcefazolin. An alternate test method must beperformed to confirm susceptibility to parenteralcefazolin.  INR/PT     Status: Abnormal   Collection Time: 02/25/16  3:53 PM  Result Value Ref Range   Prothrombin Time 28.6 (H) 11.4 - 15.2 seconds   INR 2.62   Protime-INR     Status: Abnormal   Collection Time: 03/09/16  6:11 PM  Result Value Ref Range   Prothrombin Time 21.1 (H) 11.4 - 15.2 seconds   INR 1.80   Lactate dehydrogenase     Status: Abnormal   Collection Time: 03/09/16  6:11 PM  Result Value Ref Range   LDH 224 (H) 98 - 192 U/L  MRSA PCR Screening     Status: None   Collection Time: 03/09/16  6:23 PM  Result Value Ref Range   MRSA by PCR NEGATIVE NEGATIVE    Comment:        The GeneXpert MRSA Assay (FDA approved for NASAL specimens only), is one component of a comprehensive MRSA colonization surveillance program. It is not intended to diagnose MRSA infection nor to guide or monitor treatment for MRSA infections.   Heparin level (unfractionated)     Status: None   Collection Time: 03/10/16  4:32 AM  Result Value Ref Range   Heparin Unfractionated 0.48 0.30 - 0.70 IU/mL    Comment:        IF HEPARIN RESULTS ARE BELOW EXPECTED VALUES, AND PATIENT DOSAGE HAS BEEN CONFIRMED, SUGGEST FOLLOW UP TESTING OF ANTITHROMBIN III LEVELS.   Basic metabolic panel     Status: Abnormal   Collection Time: 03/10/16  4:32 AM  Result Value Ref Range   Sodium 138 135 - 145 mmol/L   Potassium 3.6 3.5 - 5.1 mmol/L   Chloride 105 101 - 111 mmol/L   CO2 27 22 - 32 mmol/L   Glucose, Bld 88 65 - 99 mg/dL   BUN 9 6 - 20 mg/dL   Creatinine, Ser 1.00 0.61 - 1.24 mg/dL  Calcium 8.5 (L) 8.9 - 10.3 mg/dL   GFR calc non Af Amer >60 >60 mL/min   GFR calc Af Amer >60 >60 mL/min    Comment: (NOTE) The eGFR has been calculated using the CKD EPI equation. This calculation has not been validated in all clinical situations. eGFR's persistently <60 mL/min signify possible Chronic  Kidney Disease.    Anion gap 6 5 - 15  CBC     Status: Abnormal   Collection Time: 03/10/16  4:32 AM  Result Value Ref Range   WBC 6.4 4.0 - 10.5 K/uL   RBC 4.60 4.22 - 5.81 MIL/uL   Hemoglobin 13.1 13.0 - 17.0 g/dL   HCT 39.1 39.0 - 52.0 %   MCV 85.0 78.0 - 100.0 fL   MCH 28.5 26.0 - 34.0 pg   MCHC 33.5 30.0 - 36.0 g/dL   RDW 15.0 11.5 - 15.5 %   Platelets 146 (L) 150 - 400 K/uL  Lactate dehydrogenase     Status: Abnormal   Collection Time: 03/10/16  4:32 AM  Result Value Ref Range   LDH 194 (H) 98 - 192 U/L  Protime-INR     Status: Abnormal   Collection Time: 03/10/16  4:32 AM  Result Value Ref Range   Prothrombin Time 19.6 (H) 11.4 - 15.2 seconds   INR 1.63   Heparin level (unfractionated)     Status: Abnormal   Collection Time: 03/11/16  3:23 AM  Result Value Ref Range   Heparin Unfractionated 0.85 (H) 0.30 - 0.70 IU/mL    Comment:        IF HEPARIN RESULTS ARE BELOW EXPECTED VALUES, AND PATIENT DOSAGE HAS BEEN CONFIRMED, SUGGEST FOLLOW UP TESTING OF ANTITHROMBIN III LEVELS.   CBC     Status: None   Collection Time: 03/11/16  3:23 AM  Result Value Ref Range   WBC 7.9 4.0 - 10.5 K/uL   RBC 5.03 4.22 - 5.81 MIL/uL   Hemoglobin 14.2 13.0 - 17.0 g/dL   HCT 42.6 39.0 - 52.0 %   MCV 84.7 78.0 - 100.0 fL   MCH 28.2 26.0 - 34.0 pg   MCHC 33.3 30.0 - 36.0 g/dL   RDW 15.1 11.5 - 15.5 %   Platelets 180 150 - 400 K/uL  Lactate dehydrogenase     Status: Abnormal   Collection Time: 03/11/16  3:23 AM  Result Value Ref Range   LDH 217 (H) 98 - 192 U/L  Protime-INR     Status: Abnormal   Collection Time: 03/11/16  3:23 AM  Result Value Ref Range   Prothrombin Time 16.3 (H) 11.4 - 15.2 seconds   INR 1.30   Heparin level (unfractionated)     Status: None   Collection Time: 03/11/16 10:56 AM  Result Value Ref Range   Heparin Unfractionated 0.62 0.30 - 0.70 IU/mL    Comment:        IF HEPARIN RESULTS ARE BELOW EXPECTED VALUES, AND PATIENT DOSAGE HAS BEEN  CONFIRMED, SUGGEST FOLLOW UP TESTING OF ANTITHROMBIN III LEVELS.   Type and screen [place]     Status: None   Collection Time: 03/11/16  8:58 PM  Result Value Ref Range   ABO/RH(D) O POS    Antibody Screen NEG    Sample Expiration 03/14/2016   Surgical PCR screen     Status: None   Collection Time: 03/11/16 11:40 PM  Result Value Ref Range   MRSA, PCR NEGATIVE NEGATIVE   Staphylococcus aureus NEGATIVE  NEGATIVE    Comment:        The Xpert SA Assay (FDA approved for NASAL specimens in patients over 28 years of age), is one component of a comprehensive surveillance program.  Test performance has been validated by [facility] for patients greater than or equal to 58 year old. It is not intended to diagnose infection nor to guide or monitor treatment.   Lactate dehydrogenase     Status: None   Collection Time: 03/12/16  2:13 AM  Result Value Ref Range   LDH 176 98 - 192 U/L  Protime-INR     Status: Abnormal   Collection Time: 03/12/16  2:13 AM  Result Value Ref Range   Prothrombin Time 15.5 (H) 11.4 - 15.2 seconds   INR 1.22   CBC     Status: None   Collection Time: 03/12/16  2:13 AM  Result Value Ref Range   WBC 7.5 4.0 - 10.5 K/uL   RBC 4.71 4.22 - 5.81 MIL/uL   Hemoglobin 13.4 13.0 - 17.0 g/dL   HCT 40.2 39.0 - 52.0 %   MCV 85.4 78.0 - 100.0 fL   MCH 28.5 26.0 - 34.0 pg   MCHC 33.3 30.0 - 36.0 g/dL   RDW 15.1 11.5 - 15.5 %   Platelets 161 150 - 400 K/uL  APTT     Status: Abnormal   Collection Time: 03/12/16  2:13 AM  Result Value Ref Range   aPTT 140 (H) 24 - 36 seconds    Comment:        IF BASELINE aPTT IS ELEVATED, SUGGEST PATIENT RISK ASSESSMENT BE USED TO DETERMINE APPROPRIATE ANTICOAGULANT THERAPY.   Blood gas, arterial     Status: None   Collection Time: 03/12/16  3:26 AM  Result Value Ref Range   pH, Arterial 7.404 7.350 - 7.450   pCO2 arterial 40.7 32.0 - 48.0 mmHg   pO2, Arterial 84.2 83.0 - 108.0 mmHg   Bicarbonate 24.9  20.0 - 28.0 mmol/L   Acid-Base Excess 0.7 0.0 - 2.0 mmol/L   O2 Saturation 95.8 %   Patient temperature 98.6    Collection site RIGHT RADIAL    Drawn by [phone]    Sample type ARTERIAL DRAW    Allens test (pass/fail) PASS PASS  Urinalysis, Routine w reflex microscopic     Status: None   Collection Time: 03/12/16  5:21 AM  Result Value Ref Range   Color, Urine YELLOW YELLOW   APPearance CLEAR CLEAR   Specific Gravity, Urine 1.011 1.005 - 1.030   pH 6.0 5.0 - 8.0   Glucose, UA NEGATIVE NEGATIVE mg/dL   Hgb urine dipstick NEGATIVE NEGATIVE   Bilirubin Urine NEGATIVE NEGATIVE   Ketones, ur NEGATIVE NEGATIVE mg/dL   Protein, ur NEGATIVE NEGATIVE mg/dL   Nitrite NEGATIVE NEGATIVE   Leukocytes, UA NEGATIVE NEGATIVE  Anaerobic culture     Status: None   Collection Time: 03/12/16  8:18 AM  Result Value Ref Range   Specimen Description WOUND ABDOMEN    Special Requests SPEC A    Culture NO ANAEROBES ISOLATED    Report Status 03/17/2016 FINAL   Aerobic Culture (superficial specimen)     Status: None   Collection Time: 03/12/16  8:18 AM  Result Value Ref Range   Specimen Description WOUND ABDOMEN    Special Requests SPEC A    Gram Stain      ABUNDANT WBC PRESENT, PREDOMINANTLY PMN NO ORGANISMS SEEN    Culture  FEW SERRATIA MARCESCENS CRITICAL RESULT CALLED TO, READ BACK BY AND VERIFIED WITH: RN K.JOHNSON [phone] MLM    Report Status 03/14/2016 FINAL    Organism ID, Bacteria SERRATIA MARCESCENS       Susceptibility   Serratia marcescens - MIC*    CEFAZOLIN >=64 RESISTANT Resistant     CEFEPIME <=1 SENSITIVE Sensitive     CEFTAZIDIME <=1 SENSITIVE Sensitive     CEFTRIAXONE <=1 SENSITIVE Sensitive     CIPROFLOXACIN 2 INTERMEDIATE Intermediate     GENTAMICIN <=1 SENSITIVE Sensitive     TRIMETH/SULFA <=20 SENSITIVE Sensitive     * FEW SERRATIA MARCESCENS  Aerobic/Anaerobic Culture (surgical/deep wound)     Status: None   Collection Time: 03/12/16  8:24 AM  Result Value  Ref Range   Specimen Description TISSUE ABDOMEN    Special Requests SPEC B    Gram Stain      FEW WBC PRESENT,BOTH PMN AND MONONUCLEAR NO ORGANISMS SEEN    Culture      FEW SERRATIA MARCESCENS CRITICAL RESULT CALLED TO, READ BACK BY AND VERIFIED WITH: RN K.JOHNSON [phone] MLM NO ANAEROBES ISOLATED    Report Status 03/17/2016 FINAL    Organism ID, Bacteria SERRATIA MARCESCENS       Susceptibility   Serratia marcescens - MIC*    CEFAZOLIN >=64 RESISTANT Resistant     CEFEPIME <=1 SENSITIVE Sensitive     CEFTAZIDIME <=1 SENSITIVE Sensitive     CEFTRIAXONE <=1 SENSITIVE Sensitive     CIPROFLOXACIN 2 INTERMEDIATE Intermediate     GENTAMICIN <=1 SENSITIVE Sensitive     TRIMETH/SULFA <=20 SENSITIVE Sensitive     * FEW SERRATIA MARCESCENS  Heparin level (unfractionated)     Status: None   Collection Time: 03/12/16  9:34 PM  Result Value Ref Range   Heparin Unfractionated 0.30 0.30 - 0.70 IU/mL    Comment:        IF HEPARIN RESULTS ARE BELOW EXPECTED VALUES, AND PATIENT DOSAGE HAS BEEN CONFIRMED, SUGGEST FOLLOW UP TESTING OF ANTITHROMBIN III LEVELS.   Lactate dehydrogenase     Status: None   Collection Time: 03/13/16  1:35 AM  Result Value Ref Range   LDH 179 98 - 192 U/L  Protime-INR     Status: Abnormal   Collection Time: 03/13/16  1:35 AM  Result Value Ref Range   Prothrombin Time 15.4 (H) 11.4 - 15.2 seconds   INR 1.22   CBC     Status: Abnormal   Collection Time: 03/13/16  1:35 AM  Result Value Ref Range   WBC 8.4 4.0 - 10.5 K/uL   RBC 4.70 4.22 - 5.81 MIL/uL   Hemoglobin 13.3 13.0 - 17.0 g/dL   HCT 40.0 39.0 - 52.0 %   MCV 85.1 78.0 - 100.0 fL   MCH 28.3 26.0 - 34.0 pg   MCHC 33.3 30.0 - 36.0 g/dL   RDW 15.4 11.5 - 15.5 %   Platelets 148 (L) 150 - 400 K/uL  Basic metabolic panel     Status: Abnormal   Collection Time: 03/13/16  1:35 AM  Result Value Ref Range   Sodium 134 (L) 135 - 145 mmol/L   Potassium 3.7 3.5 - 5.1 mmol/L   Chloride 101 101 - 111  mmol/L   CO2 26 22 - 32 mmol/L   Glucose, Bld 112 (H) 65 - 99 mg/dL   BUN 9 6 - 20 mg/dL   Creatinine, Ser 1.13 0.61 - 1.24 mg/dL   Calcium 8.6 (  L) 8.9 - 10.3 mg/dL   GFR calc non Af Amer >60 >60 mL/min   GFR calc Af Amer >60 >60 mL/min    Comment: (NOTE) The eGFR has been calculated using the CKD EPI equation. This calculation has not been validated in all clinical situations. eGFR's persistently <60 mL/min signify possible Chronic Kidney Disease.    Anion gap 7 5 - 15  Heparin level (unfractionated)     Status: Abnormal   Collection Time: 03/13/16  8:04 AM  Result Value Ref Range   Heparin Unfractionated 0.25 (L) 0.30 - 0.70 IU/mL    Comment:        IF HEPARIN RESULTS ARE BELOW EXPECTED VALUES, AND PATIENT DOSAGE HAS BEEN CONFIRMED, SUGGEST FOLLOW UP TESTING OF ANTITHROMBIN III LEVELS.   Heparin level (unfractionated)     Status: Abnormal   Collection Time: 03/13/16 10:36 PM  Result Value Ref Range   Heparin Unfractionated 0.20 (L) 0.30 - 0.70 IU/mL    Comment:        IF HEPARIN RESULTS ARE BELOW EXPECTED VALUES, AND PATIENT DOSAGE HAS BEEN CONFIRMED, SUGGEST FOLLOW UP TESTING OF ANTITHROMBIN III LEVELS.   Lactate dehydrogenase     Status: None   Collection Time: 03/14/16  2:02 AM  Result Value Ref Range   LDH 192 98 - 192 U/L  Protime-INR     Status: Abnormal   Collection Time: 03/14/16  2:02 AM  Result Value Ref Range   Prothrombin Time 15.4 (H) 11.4 - 15.2 seconds   INR 1.21   CBC     Status: Abnormal   Collection Time: 03/14/16  2:02 AM  Result Value Ref Range   WBC 8.9 4.0 - 10.5 K/uL   RBC 4.63 4.22 - 5.81 MIL/uL   Hemoglobin 13.1 13.0 - 17.0 g/dL   HCT 40.0 39.0 - 52.0 %   MCV 86.4 78.0 - 100.0 fL   MCH 28.3 26.0 - 34.0 pg   MCHC 32.8 30.0 - 36.0 g/dL   RDW 15.4 11.5 - 15.5 %   Platelets 149 (L) 150 - 400 K/uL  Comprehensive metabolic panel     Status: Abnormal   Collection Time: 03/14/16  2:02 AM  Result Value Ref Range   Sodium 135 135 - 145  mmol/L   Potassium 3.9 3.5 - 5.1 mmol/L   Chloride 102 101 - 111 mmol/L   CO2 22 22 - 32 mmol/L   Glucose, Bld 95 65 - 99 mg/dL   BUN 12 6 - 20 mg/dL   Creatinine, Ser 1.21 0.61 - 1.24 mg/dL   Calcium 8.8 (L) 8.9 - 10.3 mg/dL   Total Protein 6.5 6.5 - 8.1 g/dL   Albumin 3.6 3.5 - 5.0 g/dL   AST 15 15 - 41 U/L   ALT 13 (L) 17 - 63 U/L   Alkaline Phosphatase 79 38 - 126 U/L   Total Bilirubin 0.6 0.3 - 1.2 mg/dL   GFR calc non Af Amer >60 >60 mL/min   GFR calc Af Amer >60 >60 mL/min    Comment: (NOTE) The eGFR has been calculated using the CKD EPI equation. This calculation has not been validated in all clinical situations. eGFR's persistently <60 mL/min signify possible Chronic Kidney Disease.    Anion gap 11 5 - 15  Heparin level (unfractionated)     Status: Abnormal   Collection Time: 03/14/16  2:02 AM  Result Value Ref Range   Heparin Unfractionated 0.17 (L) 0.30 - 0.70 IU/mL    Comment:  IF HEPARIN RESULTS ARE BELOW EXPECTED VALUES, AND PATIENT DOSAGE HAS BEEN CONFIRMED, SUGGEST FOLLOW UP TESTING OF ANTITHROMBIN III LEVELS.   Lactate dehydrogenase     Status: None   Collection Time: 03/15/16  3:53 AM  Result Value Ref Range   LDH 174 98 - 192 U/L  CBC     Status: Abnormal   Collection Time: 03/15/16  3:53 AM  Result Value Ref Range   WBC 7.9 4.0 - 10.5 K/uL   RBC 4.46 4.22 - 5.81 MIL/uL   Hemoglobin 12.8 (L) 13.0 - 17.0 g/dL   HCT 38.5 (L) 39.0 - 52.0 %   MCV 86.3 78.0 - 100.0 fL   MCH 28.7 26.0 - 34.0 pg   MCHC 33.2 30.0 - 36.0 g/dL   RDW 15.6 (H) 11.5 - 15.5 %   Platelets 145 (L) 150 - 400 K/uL  Basic metabolic panel     Status: Abnormal   Collection Time: 03/15/16  3:53 AM  Result Value Ref Range   Sodium 136 135 - 145 mmol/L   Potassium 4.0 3.5 - 5.1 mmol/L   Chloride 105 101 - 111 mmol/L   CO2 24 22 - 32 mmol/L   Glucose, Bld 91 65 - 99 mg/dL   BUN 10 6 - 20 mg/dL   Creatinine, Ser 1.02 0.61 - 1.24 mg/dL   Calcium 8.8 (L) 8.9 - 10.3 mg/dL    GFR calc non Af Amer >60 >60 mL/min   GFR calc Af Amer >60 >60 mL/min    Comment: (NOTE) The eGFR has been calculated using the CKD EPI equation. This calculation has not been validated in all clinical situations. eGFR's persistently <60 mL/min signify possible Chronic Kidney Disease.    Anion gap 7 5 - 15  Heparin level (unfractionated)     Status: Abnormal   Collection Time: 03/15/16  4:00 AM  Result Value Ref Range   Heparin Unfractionated 0.13 (L) 0.30 - 0.70 IU/mL    Comment:        IF HEPARIN RESULTS ARE BELOW EXPECTED VALUES, AND PATIENT DOSAGE HAS BEEN CONFIRMED, SUGGEST FOLLOW UP TESTING OF ANTITHROMBIN III LEVELS.   Protime-INR     Status: Abnormal   Collection Time: 03/15/16  4:00 AM  Result Value Ref Range   Prothrombin Time 15.4 (H) 11.4 - 15.2 seconds   INR 1.22   Lactate dehydrogenase     Status: None   Collection Time: 03/16/16  2:09 AM  Result Value Ref Range   LDH 179 98 - 192 U/L  CBC     Status: Abnormal   Collection Time: 03/16/16  2:09 AM  Result Value Ref Range   WBC 7.9 4.0 - 10.5 K/uL   RBC 4.37 4.22 - 5.81 MIL/uL   Hemoglobin 12.4 (L) 13.0 - 17.0 g/dL   HCT 37.8 (L) 39.0 - 52.0 %   MCV 86.5 78.0 - 100.0 fL   MCH 28.4 26.0 - 34.0 pg   MCHC 32.8 30.0 - 36.0 g/dL   RDW 15.8 (H) 11.5 - 15.5 %   Platelets 137 (L) 150 - 400 K/uL  Basic metabolic panel     Status: Abnormal   Collection Time: 03/16/16  2:09 AM  Result Value Ref Range   Sodium 137 135 - 145 mmol/L   Potassium 4.3 3.5 - 5.1 mmol/L   Chloride 105 101 - 111 mmol/L   CO2 25 22 - 32 mmol/L   Glucose, Bld 88 65 - 99 mg/dL   BUN 10 6 -  20 mg/dL   Creatinine, Ser 1.03 0.61 - 1.24 mg/dL   Calcium 8.8 (L) 8.9 - 10.3 mg/dL   GFR calc non Af Amer >60 >60 mL/min   GFR calc Af Amer >60 >60 mL/min    Comment: (NOTE) The eGFR has been calculated using the CKD EPI equation. This calculation has not been validated in all clinical situations. eGFR's persistently <60 mL/min signify possible  Chronic Kidney Disease.    Anion gap 7 5 - 15  Heparin level (unfractionated)     Status: Abnormal   Collection Time: 03/16/16  2:09 AM  Result Value Ref Range   Heparin Unfractionated <0.10 (L) 0.30 - 0.70 IU/mL    Comment:        IF HEPARIN RESULTS ARE BELOW EXPECTED VALUES, AND PATIENT DOSAGE HAS BEEN CONFIRMED, SUGGEST FOLLOW UP TESTING OF ANTITHROMBIN III LEVELS.   Protime-INR     Status: Abnormal   Collection Time: 03/16/16  2:09 AM  Result Value Ref Range   Prothrombin Time 16.2 (H) 11.4 - 15.2 seconds   INR 1.29   Lactate dehydrogenase     Status: None   Collection Time: 03/17/16  4:20 AM  Result Value Ref Range   LDH 168 98 - 192 U/L  Protime-INR     Status: Abnormal   Collection Time: 03/17/16  4:20 AM  Result Value Ref Range   Prothrombin Time 16.9 (H) 11.4 - 15.2 seconds   INR 1.36   CBC     Status: Abnormal   Collection Time: 03/17/16  4:20 AM  Result Value Ref Range   WBC 8.1 4.0 - 10.5 K/uL   RBC 4.55 4.22 - 5.81 MIL/uL   Hemoglobin 12.7 (L) 13.0 - 17.0 g/dL   HCT 39.7 39.0 - 52.0 %   MCV 87.3 78.0 - 100.0 fL   MCH 27.9 26.0 - 34.0 pg   MCHC 32.0 30.0 - 36.0 g/dL   RDW 15.8 (H) 11.5 - 15.5 %   Platelets 138 (L) 150 - 400 K/uL  Basic metabolic panel     Status: None   Collection Time: 03/17/16  4:20 AM  Result Value Ref Range   Sodium 138 135 - 145 mmol/L   Potassium 4.0 3.5 - 5.1 mmol/L   Chloride 105 101 - 111 mmol/L   CO2 25 22 - 32 mmol/L   Glucose, Bld 93 65 - 99 mg/dL   BUN 12 6 - 20 mg/dL   Creatinine, Ser 0.95 0.61 - 1.24 mg/dL   Calcium 8.9 8.9 - 10.3 mg/dL   GFR calc non Af Amer >60 >60 mL/min   GFR calc Af Amer >60 >60 mL/min    Comment: (NOTE) The eGFR has been calculated using the CKD EPI equation. This calculation has not been validated in all clinical situations. eGFR's persistently <60 mL/min signify possible Chronic Kidney Disease.    Anion gap 8 5 - 15  Heparin level (unfractionated)     Status: Abnormal   Collection Time:  03/17/16  4:20 AM  Result Value Ref Range   Heparin Unfractionated 0.17 (L) 0.30 - 0.70 IU/mL    Comment:        IF HEPARIN RESULTS ARE BELOW EXPECTED VALUES, AND PATIENT DOSAGE HAS BEEN CONFIRMED, SUGGEST FOLLOW UP TESTING OF ANTITHROMBIN III LEVELS.   Heparin level (unfractionated)     Status: Abnormal   Collection Time: 03/18/16  3:57 AM  Result Value Ref Range   Heparin Unfractionated 0.22 (L) 0.30 - 0.70 IU/mL  Comment:        IF HEPARIN RESULTS ARE BELOW EXPECTED VALUES, AND PATIENT DOSAGE HAS BEEN CONFIRMED, SUGGEST FOLLOW UP TESTING OF ANTITHROMBIN III LEVELS.   CBC     Status: Abnormal   Collection Time: 03/18/16  3:57 AM  Result Value Ref Range   WBC 7.3 4.0 - 10.5 K/uL   RBC 4.58 4.22 - 5.81 MIL/uL   Hemoglobin 13.1 13.0 - 17.0 g/dL   HCT 39.5 39.0 - 52.0 %   MCV 86.2 78.0 - 100.0 fL   MCH 28.6 26.0 - 34.0 pg   MCHC 33.2 30.0 - 36.0 g/dL   RDW 15.8 (H) 11.5 - 15.5 %   Platelets 135 (L) 150 - 400 K/uL  Lactate dehydrogenase     Status: None   Collection Time: 03/18/16  3:57 AM  Result Value Ref Range   LDH 171 98 - 192 U/L  Protime-INR     Status: Abnormal   Collection Time: 03/18/16  3:57 AM  Result Value Ref Range   Prothrombin Time 18.0 (H) 11.4 - 15.2 seconds   INR 1.47   Lactate dehydrogenase     Status: None   Collection Time: 03/19/16  5:00 AM  Result Value Ref Range   LDH 179 98 - 192 U/L  Protime-INR     Status: Abnormal   Collection Time: 03/19/16  5:00 AM  Result Value Ref Range   Prothrombin Time 19.8 (H) 11.4 - 15.2 seconds   INR 1.66   CBC     Status: Abnormal   Collection Time: 03/19/16  5:00 AM  Result Value Ref Range   WBC 8.3 4.0 - 10.5 K/uL   RBC 4.71 4.22 - 5.81 MIL/uL   Hemoglobin 13.2 13.0 - 17.0 g/dL   HCT 41.1 39.0 - 52.0 %   MCV 87.3 78.0 - 100.0 fL   MCH 28.0 26.0 - 34.0 pg   MCHC 32.1 30.0 - 36.0 g/dL   RDW 15.9 (H) 11.5 - 15.5 %   Platelets 147 (L) 150 - 400 K/uL  Heparin level (unfractionated)     Status: Abnormal    Collection Time: 03/19/16  5:00 AM  Result Value Ref Range   Heparin Unfractionated 0.22 (L) 0.30 - 0.70 IU/mL    Comment:        IF HEPARIN RESULTS ARE BELOW EXPECTED VALUES, AND PATIENT DOSAGE HAS BEEN CONFIRMED, SUGGEST FOLLOW UP TESTING OF ANTITHROMBIN III LEVELS.   Lactate dehydrogenase     Status: Abnormal   Collection Time: 03/20/16  4:13 AM  Result Value Ref Range   LDH 195 (H) 98 - 192 U/L  Protime-INR     Status: Abnormal   Collection Time: 03/20/16  4:13 AM  Result Value Ref Range   Prothrombin Time 22.0 (H) 11.4 - 15.2 seconds   INR 1.90   CBC     Status: Abnormal   Collection Time: 03/20/16  4:13 AM  Result Value Ref Range   WBC 7.7 4.0 - 10.5 K/uL   RBC 4.82 4.22 - 5.81 MIL/uL   Hemoglobin 13.5 13.0 - 17.0 g/dL   HCT 41.4 39.0 - 52.0 %   MCV 85.9 78.0 - 100.0 fL   MCH 28.0 26.0 - 34.0 pg   MCHC 32.6 30.0 - 36.0 g/dL   RDW 15.7 (H) 11.5 - 15.5 %   Platelets 161 150 - 400 K/uL  Heparin level (unfractionated)     Status: Abnormal   Collection Time: 03/20/16  4:13 AM  Result  Value Ref Range   Heparin Unfractionated 0.25 (L) 0.30 - 0.70 IU/mL    Comment:        IF HEPARIN RESULTS ARE BELOW EXPECTED VALUES, AND PATIENT DOSAGE HAS BEEN CONFIRMED, SUGGEST FOLLOW UP TESTING OF ANTITHROMBIN III LEVELS.   Basic metabolic panel     Status: Abnormal   Collection Time: 03/20/16  4:13 AM  Result Value Ref Range   Sodium 136 135 - 145 mmol/L   Potassium 4.2 3.5 - 5.1 mmol/L   Chloride 103 101 - 111 mmol/L   CO2 20 (L) 22 - 32 mmol/L   Glucose, Bld 85 65 - 99 mg/dL   BUN 13 6 - 20 mg/dL   Creatinine, Ser 1.02 0.61 - 1.24 mg/dL   Calcium 9.2 8.9 - 10.3 mg/dL   GFR calc non Af Amer >60 >60 mL/min   GFR calc Af Amer >60 >60 mL/min    Comment: (NOTE) The eGFR has been calculated using the CKD EPI equation. This calculation has not been validated in all clinical situations. eGFR's persistently <60 mL/min signify possible Chronic Kidney Disease.    Anion gap 13  5 - 15  POCT INR     Status: None   Collection Time: 03/23/16 12:00 AM  Result Value Ref Range   INR 1.6   CBC w/Diff/Platelet     Status: None   Collection Time: 03/24/16  1:48 PM  Result Value Ref Range   WBC 7.3 4.0 - 10.5 K/uL   RBC 4.83 4.22 - 5.81 MIL/uL   Hemoglobin 13.7 13.0 - 17.0 g/dL   HCT 41.8 39.0 - 52.0 %   MCV 86.5 78.0 - 100.0 fL   MCH 28.4 26.0 - 34.0 pg   MCHC 32.8 30.0 - 36.0 g/dL   RDW 15.0 11.5 - 15.5 %   Platelets 177 150 - 400 K/uL   Neutrophils Relative % 62 %   Neutro Abs 4.5 1.7 - 7.7 K/uL   Lymphocytes Relative 27 %   Lymphs Abs 2.0 0.7 - 4.0 K/uL   Monocytes Relative 9 %   Monocytes Absolute 0.6 0.1 - 1.0 K/uL   Eosinophils Relative 2 %   Eosinophils Absolute 0.1 0.0 - 0.7 K/uL   Basophils Relative 0 %   Basophils Absolute 0.0 0.0 - 0.1 K/uL  Lactate Dehydrogenase (LDH)     Status: Abnormal   Collection Time: 03/24/16  1:48 PM  Result Value Ref Range   LDH 230 (H) 98 - 192 U/L  INR/PT     Status: Abnormal   Collection Time: 03/24/16  1:48 PM  Result Value Ref Range   Prothrombin Time 19.6 (H) 11.4 - 15.2 seconds   INR 1.63   B Nat Peptide     Status: None   Collection Time: 03/24/16  1:48 PM  Result Value Ref Range   B Natriuretic Peptide 37.6 0.0 - 100.0 pg/mL  Comprehensive Metabolic Panel (CMET)     Status: None   Collection Time: 03/24/16  1:51 PM  Result Value Ref Range   Sodium 139 135 - 145 mmol/L   Potassium 3.9 3.5 - 5.1 mmol/L    Comment: SLIGHT HEMOLYSIS   Chloride 107 101 - 111 mmol/L   CO2 25 22 - 32 mmol/L   Glucose, Bld 96 65 - 99 mg/dL   BUN 10 6 - 20 mg/dL   Creatinine, Ser 1.14 0.61 - 1.24 mg/dL   Calcium 9.1 8.9 - 10.3 mg/dL   Total Protein 7.1 6.5 - 8.1  g/dL   Albumin 4.1 3.5 - 5.0 g/dL   AST 25 15 - 41 U/L   ALT 20 17 - 63 U/L   Alkaline Phosphatase 90 38 - 126 U/L   Total Bilirubin 0.8 0.3 - 1.2 mg/dL   GFR calc non Af Amer >60 >60 mL/min   GFR calc Af Amer >60 >60 mL/min    Comment: (NOTE) The eGFR has  been calculated using the CKD EPI equation. This calculation has not been validated in all clinical situations. eGFR's persistently <60 mL/min signify possible Chronic Kidney Disease.    Anion gap 7 5 - 15  Protime-INR     Status: Abnormal   Collection Time: 03/25/16  2:45 AM  Result Value Ref Range   Prothrombin Time 21.2 (H) 11.4 - 15.2 seconds   INR 1.80   CBC     Status: Abnormal   Collection Time: 03/25/16  2:45 AM  Result Value Ref Range   WBC 7.8 4.0 - 10.5 K/uL   RBC 4.43 4.22 - 5.81 MIL/uL   Hemoglobin 12.6 (L) 13.0 - 17.0 g/dL   HCT 38.4 (L) 39.0 - 52.0 %   MCV 86.7 78.0 - 100.0 fL   MCH 28.4 26.0 - 34.0 pg   MCHC 32.8 30.0 - 36.0 g/dL   RDW 15.1 11.5 - 15.5 %   Platelets 173 150 - 400 K/uL  CBC     Status: Abnormal   Collection Time: 03/26/16 11:20 AM  Result Value Ref Range   WBC 7.2 4.0 - 10.5 K/uL   RBC 4.28 4.22 - 5.81 MIL/uL   Hemoglobin 12.3 (L) 13.0 - 17.0 g/dL   HCT 37.0 (L) 39.0 - 52.0 %   MCV 86.4 78.0 - 100.0 fL   MCH 28.7 26.0 - 34.0 pg   MCHC 33.2 30.0 - 36.0 g/dL   RDW 15.2 11.5 - 15.5 %   Platelets 166 150 - 400 K/uL  INR/PT     Status: Abnormal   Collection Time: 03/26/16 11:20 AM  Result Value Ref Range   Prothrombin Time 21.4 (H) 11.4 - 15.2 seconds   INR 1.83   Lactate Dehydrogenase (LDH)     Status: Abnormal   Collection Time: 03/26/16 11:20 AM  Result Value Ref Range   LDH 205 (H) 98 - 192 U/L  Protime-INR     Status: None   Collection Time: 03/30/16  9:23 AM  Result Value Ref Range   Prothrombin Time 13.8 11.4 - 15.2 seconds   INR 1.06   Lactate dehydrogenase     Status: None   Collection Time: 03/30/16  9:23 AM  Result Value Ref Range   LDH 176 98 - 192 U/L  Protime-INR     Status: None   Collection Time: 04/02/16  9:25 AM  Result Value Ref Range   Prothrombin Time 15.2 11.4 - 15.2 seconds   INR 1.19   Lactate dehydrogenase     Status: None   Collection Time: 04/02/16  9:25 AM  Result Value Ref Range   LDH 186 98 - 192 U/L   Lactate dehydrogenase     Status: Abnormal   Collection Time: 04/06/16  9:29 AM  Result Value Ref Range   LDH 225 (H) 98 - 192 U/L  Protime-INR     Status: Abnormal   Collection Time: 04/06/16  9:29 AM  Result Value Ref Range   Prothrombin Time 25.7 (H) 11.4 - 15.2 seconds   INR 2.30   CBC  Status: None   Collection Time: 04/13/16 10:39 AM  Result Value Ref Range   WBC 7.1 4.0 - 10.5 K/uL   RBC 4.56 4.22 - 5.81 MIL/uL   Hemoglobin 13.0 13.0 - 17.0 g/dL   HCT 39.2 39.0 - 52.0 %   MCV 86.0 78.0 - 100.0 fL   MCH 28.5 26.0 - 34.0 pg   MCHC 33.2 30.0 - 36.0 g/dL   RDW 14.6 11.5 - 15.5 %   Platelets 189 150 - 400 K/uL  Lactate Dehydrogenase (LDH)     Status: Abnormal   Collection Time: 04/13/16 10:39 AM  Result Value Ref Range   LDH 225 (H) 98 - 192 U/L  INR/PT     Status: Abnormal   Collection Time: 04/13/16 10:39 AM  Result Value Ref Range   Prothrombin Time 24.2 (H) 11.4 - 15.2 seconds   INR 2.13   Protime-INR     Status: Abnormal   Collection Time: 04/29/16  9:25 AM  Result Value Ref Range   Prothrombin Time 21.1 (H) 11.4 - 15.2 seconds   INR 7.56   Basic metabolic panel     Status: None   Collection Time: 04/29/16  9:25 AM  Result Value Ref Range   Sodium 139 135 - 145 mmol/L   Potassium 3.7 3.5 - 5.1 mmol/L   Chloride 106 101 - 111 mmol/L   CO2 25 22 - 32 mmol/L   Glucose, Bld 90 65 - 99 mg/dL   BUN 9 6 - 20 mg/dL   Creatinine, Ser 1.02 0.61 - 1.24 mg/dL   Calcium 9.1 8.9 - 10.3 mg/dL   GFR calc non Af Amer >60 >60 mL/min   GFR calc Af Amer >60 >60 mL/min    Comment: (NOTE) The eGFR has been calculated using the CKD EPI equation. This calculation has not been validated in all clinical situations. eGFR's persistently <60 mL/min signify possible Chronic Kidney Disease.    Anion gap 8 5 - 15  CBC     Status: Abnormal   Collection Time: 04/29/16  9:25 AM  Result Value Ref Range   WBC 7.0 4.0 - 10.5 K/uL   RBC 4.61 4.22 - 5.81 MIL/uL   Hemoglobin 12.6 (L)  13.0 - 17.0 g/dL   HCT 39.3 39.0 - 52.0 %   MCV 85.2 78.0 - 100.0 fL   MCH 27.3 26.0 - 34.0 pg   MCHC 32.1 30.0 - 36.0 g/dL   RDW 14.9 11.5 - 15.5 %   Platelets 150 150 - 400 K/uL  Lactate dehydrogenase     Status: Abnormal   Collection Time: 04/29/16  9:25 AM  Result Value Ref Range   LDH 232 (H) 98 - 192 U/L  Protime-INR     Status: Abnormal   Collection Time: 05/06/16 10:56 AM  Result Value Ref Range   Prothrombin Time 22.6 (H) 11.4 - 15.2 seconds   INR 1.96     ASSESSMENT AND PLAN:   1) Chronic systolic HF, s/p HMII LVAD implant 05/2012.  - Listed 1A at [facility]. Also with visit to [facility] for dual listing. NYHA II Volume status stable.  - He is not on a BB. Intolerant BB.  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   I changed driveline dressing using sterile technique. Wound around drive line had granulated in so I started aquacel Ag today. Plan to change next week.  . Continue ceftriaxone 2 grams  every 12 hours per ID. Plan for ceftriaxone for another month per Dr Johnnye Sima. PICC ok   3) HTN-  - Maps elevated today. Increase amlodipine to 5 mg daily.   3) Hyperthyroidism - Amio induced. Follows with Dr. Forde Dandy. Amio stopped. Recent TSH 02/2016 1.422   4) NSVT/VT  - Off amio. On mexilitene. - ICD now at EOL. No plans to replace at this time. Reviewed 04/14/16 continue current plan.  5) PVCs - Resolved.   6) Anticoagulation with coumadin  - Continue coumadin and ASA 325 mg for LVAD. No bleeding problems. INR 1.96.  See anticoagulation flow sheet. Marland Kitchen    7) LVAD  Vad indices stable.   Follow up next week for dressing change.  Xavious Sharrar NP-C  1:30 PM

## 2016-05-06 NOTE — Patient Instructions (Addendum)
INCREASE your Amlodipine to 5 mg EVERY DAY (can take 1/2 of a 10 mg tablet that you already have. Will send in new prescription for 5 mg tablets for the next refill).   Back to see [place] in 1 week for dressing change.

## 2016-05-06 NOTE — Addendum Note (Signed)
Encounter addended by: Sherald Hess, NP on: 05/06/2016  1:39 PM<BR>    Actions taken: LOS modified, Follow-up modified, Sign clinical note, Visit diagnoses modified

## 2016-05-12 ENCOUNTER — Other Ambulatory Visit (HOSPITAL_COMMUNITY): Payer: Self-pay | Admitting: *Deleted

## 2016-05-12 ENCOUNTER — Telehealth (HOSPITAL_COMMUNITY): Payer: Self-pay | Admitting: *Deleted

## 2016-05-12 ENCOUNTER — Ambulatory Visit (HOSPITAL_COMMUNITY)
Admission: RE | Admit: 2016-05-12 | Discharge: 2016-05-12 | Disposition: A | Discharge status: Home / Self Care | Payer: Medicaid Other | Source: Ambulatory Visit | Attending: Internal Medicine | Admitting: Internal Medicine

## 2016-05-12 ENCOUNTER — Ambulatory Visit (HOSPITAL_COMMUNITY): Payer: Self-pay | Admitting: Pharmacist

## 2016-05-12 DIAGNOSIS — Z95811 Presence of heart assist device: Secondary | ICD-10-CM | POA: Diagnosis not present

## 2016-05-12 DIAGNOSIS — I5022 Chronic systolic (congestive) heart failure: Secondary | ICD-10-CM | POA: Diagnosis not present

## 2016-05-12 DIAGNOSIS — Y838 Other surgical procedures as the cause of abnormal reaction of the patient, or of later complication, without mention of misadventure at the time of the procedure: Secondary | ICD-10-CM | POA: Insufficient documentation

## 2016-05-12 DIAGNOSIS — I10 Essential (primary) hypertension: Secondary | ICD-10-CM

## 2016-05-12 DIAGNOSIS — T827XXA Infection and inflammatory reaction due to other cardiac and vascular devices, implants and grafts, initial encounter: Secondary | ICD-10-CM | POA: Insufficient documentation

## 2016-05-12 DIAGNOSIS — Z7901 Long term (current) use of anticoagulants: Secondary | ICD-10-CM

## 2016-05-12 DIAGNOSIS — Z5181 Encounter for therapeutic drug level monitoring: Secondary | ICD-10-CM

## 2016-05-12 DIAGNOSIS — T462X5A Adverse effect of other antidysrhythmic drugs, initial encounter: Secondary | ICD-10-CM | POA: Insufficient documentation

## 2016-05-12 DIAGNOSIS — I272 Pulmonary hypertension, unspecified: Secondary | ICD-10-CM | POA: Diagnosis not present

## 2016-05-12 DIAGNOSIS — T829XXD Unspecified complication of cardiac and vascular prosthetic device, implant and graft, subsequent encounter: Secondary | ICD-10-CM

## 2016-05-12 DIAGNOSIS — Z79899 Other long term (current) drug therapy: Secondary | ICD-10-CM | POA: Diagnosis not present

## 2016-05-12 DIAGNOSIS — I472 Ventricular tachycardia: Secondary | ICD-10-CM | POA: Diagnosis not present

## 2016-05-12 DIAGNOSIS — A498 Other bacterial infections of unspecified site: Secondary | ICD-10-CM

## 2016-05-12 DIAGNOSIS — E0580 Other thyrotoxicosis without thyrotoxic crisis or storm: Secondary | ICD-10-CM | POA: Diagnosis not present

## 2016-05-12 DIAGNOSIS — Z7982 Long term (current) use of aspirin: Secondary | ICD-10-CM | POA: Diagnosis not present

## 2016-05-12 DIAGNOSIS — I110 Hypertensive heart disease with heart failure: Secondary | ICD-10-CM | POA: Insufficient documentation

## 2016-05-12 LAB — CBC
HEMATOCRIT: 41 % (ref 39.0–52.0)
Hemoglobin: 13.3 g/dL (ref 13.0–17.0)
MCH: 27.4 pg (ref 26.0–34.0)
MCHC: 32.4 g/dL (ref 30.0–36.0)
MCV: 84.4 fL (ref 78.0–100.0)
PLATELETS: 177 10*3/uL (ref 150–400)
RBC: 4.86 MIL/uL (ref 4.22–5.81)
RDW: 14.6 % (ref 11.5–15.5)
WBC: 6.5 10*3/uL (ref 4.0–10.5)

## 2016-05-12 LAB — COMPREHENSIVE METABOLIC PANEL
ALT: 17 U/L (ref 17–63)
AST: 21 U/L (ref 15–41)
Albumin: 4 g/dL (ref 3.5–5.0)
Alkaline Phosphatase: 88 U/L (ref 38–126)
Anion gap: 12 (ref 5–15)
BUN: 7 mg/dL (ref 6–20)
CHLORIDE: 106 mmol/L (ref 101–111)
CO2: 23 mmol/L (ref 22–32)
Calcium: 9 mg/dL (ref 8.9–10.3)
Creatinine, Ser: 1.01 mg/dL (ref 0.61–1.24)
Glucose, Bld: 89 mg/dL (ref 65–99)
POTASSIUM: 3.3 mmol/L — AB (ref 3.5–5.1)
SODIUM: 141 mmol/L (ref 135–145)
Total Bilirubin: 0.5 mg/dL (ref 0.3–1.2)
Total Protein: 7 g/dL (ref 6.5–8.1)

## 2016-05-12 LAB — PROTIME-INR
INR: 2.22
Prothrombin Time: 25 seconds — ABNORMAL HIGH (ref 11.4–15.2)

## 2016-05-12 LAB — LACTATE DEHYDROGENASE: LDH: 226 U/L — AB (ref 98–192)

## 2016-05-12 NOTE — Telephone Encounter (Signed)
Called to inform patient of potassium results. Instructed to take 40 mEq potassium today with his one-time Lasix dose. Will schedule for repeat BMET next week during dressing change.

## 2016-05-12 NOTE — Progress Notes (Addendum)
Patient presents for dressing change and 2 month  follow up in V[facility] Clinic today. Reports no problems with VAD equipment or concerns with drive line.  Vital Signs:  Doppler Pressure 84   Automatc BP: 98/78 (89) HR:85   SPO2:99  %  Weight: 258.2 lb w/o eqt Last weight: 257 lb Home weights: 260 lbs   VAD Indication: Bridge to Transplant- Evaluation completed at [facility] with IB listing 05/30/2013; dual listed with [facility] 12/04/2014.  1A [facility] &1B [facility].       VAD interrogation & Equipment Management: Speed:9400 Flow: 4.7 Power:5.5 w    PI:6.4  Alarms: no clinical alarms Events: 3-5 daily PI  Fixed speed 9400 Low speed limit: 8800  Primary Controller:  Replace back up battery in 64months. Back up controller:   Replace back up battery in 16 months.  Annual Equipment Maintenance on UBC/PM was performed on 06/19/2015.   I reviewed the LVAD parameters from today and compared the results to the patient's prior recorded data. LVAD interrogation was NEGATIVE for significant power changes, NEGATIVE for clinical alarms and STABLE for PI events/speed drops. No programming changes were made and pump is functioning within specified parameters. Pt is performing daily controller and system monitor self tests along with completing weekly and monthly maintenance for LVAD equipment.  LVAD equipment check completed and is in good working order. Back-up equipment present. Charged back up battery and performed self-test on equipment.   Exit Site Care: Drive line dressing changed per Tonye Becket NP today in clinic.   Significant Events on VAD Support:  08/26/15 >>Serratia marcescens (rare) >>Cipro 500 mg bid x 7 days  09/29/15 >> Serratia marcescens (abundant) >> Cipro 500 mg BID x14d 11/20/15 >> Serratia marcescens (few) Admitted IV ceftriaxone >> cipro 500 mg twice daily x 30 days 02/03/16 >> Serratia species >> Cipro 500 mg BID x 14d; referral to ID 02/13/16 03/11/2016 >> driveline  debridement, IV antibiotics  Device:Medtronic Dual Therapies: on Last check: 08/05/2014 (assessed Optivol 02/05/16)   BP & Labs:  MAP 86 - Doppler is reflecting MAP  Hgb 13.3 - No S/S of bleeding. Specifically denies melena/BRBPR or nosebleeds.  LDH stable at 226 with established baseline of 160- 230. Denies tea-colored urine. No power elevations noted on interrogation.   Return to clinic in one week for dressing change per VAD Coordinator. Instructed to bring power module and Dentist for maintenance at his next appointment.  Marcellus Scott RN VAD Coordinator   Office: [phone] 24/7 Emergency VAD Pager: [phone]

## 2016-05-12 NOTE — Patient Instructions (Signed)
Return to clinic in one week for dressing change.  Take one dose Lasix 40mg  tablet today. Monitor fluid and salt intake closely.

## 2016-05-12 NOTE — Progress Notes (Signed)
LVAD CLINIC NOTE  Patient ID: Robert [name], male   DOB: 08-13-1955, 61 y.o.   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 status post dual chamber Medtronic ICD implanted April 2011. Cath 2011 showed normal coronaries. He also has a history of VTach .   Amio recently stopped due to thyrotoxicity. Placed on prednisone and tapazole. Had VT off amio and mexilitene started.  Had [place] on 8/17 as part of dual listing process at [facility]. Normal filling pressures and cardiac output.   Drive Line Infeciton  08/26/15 >>Serratia marcescens (rare) >>Cipro 500 mg bid x 7 days  09/29/15 >> Serratia marcescens (abundant) >>Cipro 500 mg BID x14d 11/20/15 >>Serratia marcescens (few) Admitted IV ceftriaxone >>cipro 500 mg twice daily x 30 days 02/03/16 >> Serratia species >> Cipro 500 mg BID x 14d; referral to ID  03/12/2016 Surgical Debridement with VAC placement. Full thickness 5x8x6 cm. 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 Plan to continued ced on cefepime 1 gram IV every 8 hours  03/16/2016 PICC line placed for IV antibiotics.  03/17/2016 VAC reapplied with 1 gram of A Cell applied. 03/25/2016 VAC removed Damp saline dressing applied.  04/02/2016 Acell applied  04/20/2016 Acell applied.  05/06/2016 Aquacel Ag started.   Admitted to [facility] on 12/5 for heparin bridge and surgical debridement of persistent drive line infection. VAC applied with A cell. VAC maintained seal at 75 mm hg continuous therapy. Plan for ceftriaxone for 6 wks until 04/23/16. Will also need VAC changes weekly with ACell. [facility] following for home antibiotics.   Today he returned for LVAD follow up. Last visit amlodipine was increased and dressing changed to aquacel ag. Overall feeling ok. Denies SOB/PND/Orthopnea. No fever or  chills. Eating high salt diet. Marland Kitchen Continue [facility] for home antibiotics. No PICC issues.  Denies R upper arm pain.  No bleeding problems.   Recent RHC on 11/06/15 as part of Transplant process RA = 6 RV =  18/3/6 PA = 19/7 (12) PCW = 8 Fick cardiac output/index = 6.1/2.6 PVR = .0.7 WU Ao sat = 98% PA sat = 70%, 69%:  I reviewed the LVAD parameters from today, and compared the results to the patient's prior recorded data.  No programming changes were made.  The LVAD is functioning within specified parameters.  The patient performs LVAD self-test daily.  LVAD interrogation was negative for any significant power changes, alarms or PI events/speed drops.  LVAD equipment check completed and is in good working order.  Back-up equipment present.   LVAD education done on emergency procedures and precautions and reviewed exit site care.    Past Medical History:  Diagnosis Date  . AICD (automatic cardioverter/defibrillator) present 2011  . Bronchitis   . Congestive heart failure ([place]) 2011  . Hypertension 2000  . LVAD (left ventricular assist device) present ([place]) 2014  . Pneumonia   . Pulmonary hypertension     Current Outpatient Prescriptions  Medication Sig Dispense Refill  . allopurinol (ZYLOPRIM) 300 MG tablet Take 1 tablet (300 mg total) by mouth daily. 30 tablet 6  . amLODipine (NORVASC) 5 MG tablet Take 1 tablet (5 mg total) by mouth daily. 14 tablet 0  . aspirin EC 325 MG tablet Take 1 tablet (325 mg total) by mouth daily. [place]  tablet 6  . hydrALAZINE (APRESOLINE) 50 MG tablet Take 0.5 tablets (25 mg total) by mouth 2 (two) times daily. 30 tablet 6  . mexiletine (MEXITIL) 150 MG capsule Take 1 capsule (150 mg total) by mouth 2 (two) times daily. 60 capsule 5  . Multiple Vitamin (MULTIVITAMIN) tablet Take 1 tablet by mouth daily.    . pantoprazole (PROTONIX) 40 MG tablet Take 1 tablet (40 mg total) by mouth daily. 30 tablet 3  . sacubitril-valsartan (ENTRESTO) 97-103 MG Take 1 tablet by mouth  2 (two) times daily. 60 tablet 3  . traMADol (ULTRAM) 50 MG tablet Take 1 tablet (50 mg total) by mouth every 8 (eight) hours as needed for moderate pain. 30 tablet 5  . warfarin (COUMADIN) 5 MG tablet Take daily as directed by VAD clinic based on weekly INR results. 30 tablet 3  . furosemide (LASIX) 40 MG tablet Take 1 tablet (40 mg total) by mouth daily as needed. Take as directed by clinic 30 tablet 3   No current facility-administered medications for this encounter.     Metoprolol and Imdur [isosorbide nitrate]  REVIEW OF SYSTEMS: All systems negative except as listed in HPI, PMH and Problem list. Vital Signs:  Doppler Pressure 84              Automatc BP: 98/78 (89) HR:85 SPO2:99%  VAD Indication: Bridge to Transplant- Evaluation completed at [facility] with IB listing 05/30/2013; dual listed with [facility] 12/04/2014.  1A [facility] &1[facility] [place].    VAD interrogation & Equipment Management: Speed:9400 Flow: 4.7 Power:5.5 PI:6.4 Alarms: no alarms  Events: 3-5 per day Fixed speed 9400 Low speed limit: 8800 Physical Exam: GENERAL: NAD, male who presents to clinic.Walked in the clinic without difficulty HEENT: normal  NECK: Supple, JVP flat;  2+ bilaterally, no bruits.  No lymphadenopathy or thyromegaly appreciated.  CARDIAC:  Mechanical heart sounds with LVAD hum present.  LUNGS:  CTAB ABDOMEN:  Soft , NT,ND positive bowel sounds x4.     LVAD exit site: Granulating around drive line. No odor. Minimal exudate. Driveline has multiple twists.  EXTREMITIES:  Warm No cyanosis. RUE PICC ok. RUE non tender. Rand LLE trace-1+edema.   NEUROLOGIC:  A&O x3. Gait steady.  No aphasia.  No dysarthria.  Affect pleasant.     Recent Results (from the past 2160 hour(s))  Lactate dehydrogenase     Status: Abnormal   Collection Time: 02/13/16 10:49 AM  Result Value Ref Range   LDH 209 (H) 98 - 192 U/L  Protime-INR     Status: Abnormal   Collection Time: 02/13/16 10:49 AM  Result Value Ref  Range   Prothrombin Time 20.2 (H) 11.4 - 15.2 seconds   INR 1.70   CBC with Differential     Status: Abnormal   Collection Time: 02/13/16 10:49 AM  Result Value Ref Range   WBC 6.5 4.0 - 10.5 K/uL   RBC 4.70 4.22 - 5.81 MIL/uL   Hemoglobin 13.2 13.0 - 17.0 g/dL   HCT 40.5 39.0 - 52.0 %   MCV 86.2 78.0 - 100.0 fL   MCH 28.1 26.0 - 34.0 pg   MCHC 32.6 30.0 - 36.0 g/dL   RDW 15.0 11.5 - 15.5 %   Platelets 133 (L) 150 - 400 K/uL   Neutrophils Relative % 67 %   Neutro Abs 4.3 1.7 - 7.7 K/uL   Lymphocytes Relative 24 %   Lymphs Abs 1.6 0.7 - 4.0 K/uL   Monocytes Relative 8 %  Monocytes Absolute 0.5 0.1 - 1.0 K/uL   Eosinophils Relative 1 %   Eosinophils Absolute 0.1 0.0 - 0.7 K/uL   Basophils Relative 0 %   Basophils Absolute 0.0 0.0 - 0.1 K/uL  INR/PT     Status: Abnormal   Collection Time: 02/20/16 10:22 AM  Result Value Ref Range   Prothrombin Time 29.2 (H) 11.4 - 15.2 seconds   INR 2.69   CULTURE, ROUTINE-ABSCESS     Status: None   Collection Time: 02/25/16  3:30 PM  Result Value Ref Range   Culture Moderate SERRATIA MARCESCENS    Gram Stain Moderate    Gram Stain WBC present-both PMN and Mononuclear    Gram Stain No Squamous Epithelial Cells Seen    Gram Stain No Organisms Seen    Organism ID, Bacteria SERRATIA MARCESCENS       Susceptibility   Serratia marcescens -  (no method available)    AMOX/CLAVULANIC  Resistant     CEFAZOLIN >=64 Resistant     CEFTRIAXONE <=1 Sensitive     CEFTAZIDIME <=1 Sensitive     CEFEPIME <=1 Sensitive     GENTAMICIN <=1 Sensitive     TOBRAMYCIN <=1 Sensitive     CIPROFLOXACIN 2 Intermediate     LEVOFLOXACIN 4 Intermediate     TRIMETH/SULFA* <=20 Sensitive      * NR=NOT REPORTABLE,SEE COMMENTORAL therapy:A cefazolin MIC of <32 predicts susceptibility to the oral agents cefaclor,cefdinir,cefpodoxime,cefprozil,cefuroxime,cephalexin,and loracarbef when used for therapy of uncomplicated UTIs due to E.coli,K.pneumomiae,and P.mirabilis.  PARENTERAL therapy: A cefazolinMIC of >8 indicates resistance to parenteralcefazolin. An alternate test method must beperformed to confirm susceptibility to parenteralcefazolin.  INR/PT     Status: Abnormal   Collection Time: 02/25/16  3:53 PM  Result Value Ref Range   Prothrombin Time 28.6 (H) 11.4 - 15.2 seconds   INR 2.62   Protime-INR     Status: Abnormal   Collection Time: 03/09/16  6:11 PM  Result Value Ref Range   Prothrombin Time 21.1 (H) 11.4 - 15.2 seconds   INR 1.80   Lactate dehydrogenase     Status: Abnormal   Collection Time: 03/09/16  6:11 PM  Result Value Ref Range   LDH 224 (H) 98 - 192 U/L  MRSA PCR Screening     Status: None   Collection Time: 03/09/16  6:23 PM  Result Value Ref Range   MRSA by PCR NEGATIVE NEGATIVE    Comment:        The GeneXpert MRSA Assay (FDA approved for NASAL specimens only), is one component of a comprehensive MRSA colonization surveillance program. It is not intended to diagnose MRSA infection nor to guide or monitor treatment for MRSA infections.   Heparin level (unfractionated)     Status: None   Collection Time: 03/10/16  4:32 AM  Result Value Ref Range   Heparin Unfractionated 0.48 0.30 - 0.70 IU/mL    Comment:        IF HEPARIN RESULTS ARE BELOW EXPECTED VALUES, AND PATIENT DOSAGE HAS BEEN CONFIRMED, SUGGEST FOLLOW UP TESTING OF ANTITHROMBIN III LEVELS.   Basic metabolic panel     Status: Abnormal   Collection Time: 03/10/16  4:32 AM  Result Value Ref Range   Sodium 138 135 - 145 mmol/L   Potassium 3.6 3.5 - 5.1 mmol/L   Chloride 105 101 - 111 mmol/L   CO2 27 22 - 32 mmol/L   Glucose, Bld 88 65 - 99 mg/dL   BUN 9 6 - 20  mg/dL   Creatinine, Ser 1.00 0.61 - 1.24 mg/dL   Calcium 8.5 (L) 8.9 - 10.3 mg/dL   GFR calc non Af Amer >60 >60 mL/min   GFR calc Af Amer >60 >60 mL/min    Comment: (NOTE) The eGFR has been calculated using the CKD EPI equation. This calculation has not been validated in all clinical  situations. eGFR's persistently <60 mL/min signify possible Chronic Kidney Disease.    Anion gap 6 5 - 15  CBC     Status: Abnormal   Collection Time: 03/10/16  4:32 AM  Result Value Ref Range   WBC 6.4 4.0 - 10.5 K/uL   RBC 4.60 4.22 - 5.81 MIL/uL   Hemoglobin 13.1 13.0 - 17.0 g/dL   HCT 39.1 39.0 - 52.0 %   MCV 85.0 78.0 - 100.0 fL   MCH 28.5 26.0 - 34.0 pg   MCHC 33.5 30.0 - 36.0 g/dL   RDW 15.0 11.5 - 15.5 %   Platelets 146 (L) 150 - 400 K/uL  Lactate dehydrogenase     Status: Abnormal   Collection Time: 03/10/16  4:32 AM  Result Value Ref Range   LDH 194 (H) 98 - 192 U/L  Protime-INR     Status: Abnormal   Collection Time: 03/10/16  4:32 AM  Result Value Ref Range   Prothrombin Time 19.6 (H) 11.4 - 15.2 seconds   INR 1.63   Heparin level (unfractionated)     Status: Abnormal   Collection Time: 03/11/16  3:23 AM  Result Value Ref Range   Heparin Unfractionated 0.85 (H) 0.30 - 0.70 IU/mL    Comment:        IF HEPARIN RESULTS ARE BELOW EXPECTED VALUES, AND PATIENT DOSAGE HAS BEEN CONFIRMED, SUGGEST FOLLOW UP TESTING OF ANTITHROMBIN III LEVELS.   CBC     Status: None   Collection Time: 03/11/16  3:23 AM  Result Value Ref Range   WBC 7.9 4.0 - 10.5 K/uL   RBC 5.03 4.22 - 5.81 MIL/uL   Hemoglobin 14.2 13.0 - 17.0 g/dL   HCT 42.6 39.0 - 52.0 %   MCV 84.7 78.0 - 100.0 fL   MCH 28.2 26.0 - 34.0 pg   MCHC 33.3 30.0 - 36.0 g/dL   RDW 15.1 11.5 - 15.5 %   Platelets 180 150 - 400 K/uL  Lactate dehydrogenase     Status: Abnormal   Collection Time: 03/11/16  3:23 AM  Result Value Ref Range   LDH 217 (H) 98 - 192 U/L  Protime-INR     Status: Abnormal   Collection Time: 03/11/16  3:23 AM  Result Value Ref Range   Prothrombin Time 16.3 (H) 11.4 - 15.2 seconds   INR 1.30   Heparin level (unfractionated)     Status: None   Collection Time: 03/11/16 10:56 AM  Result Value Ref Range   Heparin Unfractionated 0.62 0.30 - 0.70 IU/mL    Comment:        IF HEPARIN RESULTS ARE  BELOW EXPECTED VALUES, AND PATIENT DOSAGE HAS BEEN CONFIRMED, SUGGEST FOLLOW UP TESTING OF ANTITHROMBIN III LEVELS.   Type and screen [place]     Status: None   Collection Time: 03/11/16  8:58 PM  Result Value Ref Range   ABO/RH(D) O POS    Antibody Screen NEG    Sample Expiration 03/14/2016   Surgical PCR screen     Status: None   Collection Time: 03/11/16 11:40 PM  Result Value Ref  Range   MRSA, PCR NEGATIVE NEGATIVE   Staphylococcus aureus NEGATIVE NEGATIVE    Comment:        The Xpert SA Assay (FDA approved for NASAL specimens in patients over 36 years of age), is one component of a comprehensive surveillance program.  Test performance has been validated by [facility] for patients greater than or equal to 75 year old. It is not intended to diagnose infection nor to guide or monitor treatment.   Lactate dehydrogenase     Status: None   Collection Time: 03/12/16  2:13 AM  Result Value Ref Range   LDH 176 98 - 192 U/L  Protime-INR     Status: Abnormal   Collection Time: 03/12/16  2:13 AM  Result Value Ref Range   Prothrombin Time 15.5 (H) 11.4 - 15.2 seconds   INR 1.22   CBC     Status: None   Collection Time: 03/12/16  2:13 AM  Result Value Ref Range   WBC 7.5 4.0 - 10.5 K/uL   RBC 4.71 4.22 - 5.81 MIL/uL   Hemoglobin 13.4 13.0 - 17.0 g/dL   HCT 40.2 39.0 - 52.0 %   MCV 85.4 78.0 - 100.0 fL   MCH 28.5 26.0 - 34.0 pg   MCHC 33.3 30.0 - 36.0 g/dL   RDW 15.1 11.5 - 15.5 %   Platelets 161 150 - 400 K/uL  APTT     Status: Abnormal   Collection Time: 03/12/16  2:13 AM  Result Value Ref Range   aPTT 140 (H) 24 - 36 seconds    Comment:        IF BASELINE aPTT IS ELEVATED, SUGGEST PATIENT RISK ASSESSMENT BE USED TO DETERMINE APPROPRIATE ANTICOAGULANT THERAPY.   Blood gas, arterial     Status: None   Collection Time: 03/12/16  3:26 AM  Result Value Ref Range   pH, Arterial 7.404 7.350 - 7.450   pCO2 arterial 40.7 32.0 - 48.0 mmHg   pO2,  Arterial 84.2 83.0 - 108.0 mmHg   Bicarbonate 24.9 20.0 - 28.0 mmol/L   Acid-Base Excess 0.7 0.0 - 2.0 mmol/L   O2 Saturation 95.8 %   Patient temperature 98.6    Collection site RIGHT RADIAL    Drawn by [phone]    Sample type ARTERIAL DRAW    Allens test (pass/fail) PASS PASS  Urinalysis, Routine w reflex microscopic     Status: None   Collection Time: 03/12/16  5:21 AM  Result Value Ref Range   Color, Urine YELLOW YELLOW   APPearance CLEAR CLEAR   Specific Gravity, Urine 1.011 1.005 - 1.030   pH 6.0 5.0 - 8.0   Glucose, UA NEGATIVE NEGATIVE mg/dL   Hgb urine dipstick NEGATIVE NEGATIVE   Bilirubin Urine NEGATIVE NEGATIVE   Ketones, ur NEGATIVE NEGATIVE mg/dL   Protein, ur NEGATIVE NEGATIVE mg/dL   Nitrite NEGATIVE NEGATIVE   Leukocytes, UA NEGATIVE NEGATIVE  Anaerobic culture     Status: None   Collection Time: 03/12/16  8:18 AM  Result Value Ref Range   Specimen Description WOUND ABDOMEN    Special Requests SPEC A    Culture NO ANAEROBES ISOLATED    Report Status 03/17/2016 FINAL   Aerobic Culture (superficial specimen)     Status: None   Collection Time: 03/12/16  8:18 AM  Result Value Ref Range   Specimen Description WOUND ABDOMEN    Special Requests SPEC A    Gram Stain      ABUNDANT WBC  PRESENT, PREDOMINANTLY PMN NO ORGANISMS SEEN    Culture      FEW SERRATIA MARCESCENS CRITICAL RESULT CALLED TO, READ BACK BY AND VERIFIED WITH: RN K.JOHNSON [phone] MLM    Report Status 03/14/2016 FINAL    Organism ID, Bacteria SERRATIA MARCESCENS       Susceptibility   Serratia marcescens - MIC*    CEFAZOLIN >=64 RESISTANT Resistant     CEFEPIME <=1 SENSITIVE Sensitive     CEFTAZIDIME <=1 SENSITIVE Sensitive     CEFTRIAXONE <=1 SENSITIVE Sensitive     CIPROFLOXACIN 2 INTERMEDIATE Intermediate     GENTAMICIN <=1 SENSITIVE Sensitive     TRIMETH/SULFA <=20 SENSITIVE Sensitive     * FEW SERRATIA MARCESCENS  Aerobic/Anaerobic Culture (surgical/deep wound)     Status: None     Collection Time: 03/12/16  8:24 AM  Result Value Ref Range   Specimen Description TISSUE ABDOMEN    Special Requests SPEC B    Gram Stain      FEW WBC PRESENT,BOTH PMN AND MONONUCLEAR NO ORGANISMS SEEN    Culture      FEW SERRATIA MARCESCENS CRITICAL RESULT CALLED TO, READ BACK BY AND VERIFIED WITH: RN K.JOHNSON [id number] MLM NO ANAEROBES ISOLATED    Report Status 03/17/2016 FINAL    Organism ID, Bacteria SERRATIA MARCESCENS       Susceptibility   Serratia marcescens - MIC*    CEFAZOLIN >=64 RESISTANT Resistant     CEFEPIME <=1 SENSITIVE Sensitive     CEFTAZIDIME <=1 SENSITIVE Sensitive     CEFTRIAXONE <=1 SENSITIVE Sensitive     CIPROFLOXACIN 2 INTERMEDIATE Intermediate     GENTAMICIN <=1 SENSITIVE Sensitive     TRIMETH/SULFA <=20 SENSITIVE Sensitive     * FEW SERRATIA MARCESCENS  Heparin level (unfractionated)     Status: None   Collection Time: 03/12/16  9:34 PM  Result Value Ref Range   Heparin Unfractionated 0.30 0.30 - 0.70 IU/mL    Comment:        IF HEPARIN RESULTS ARE BELOW EXPECTED VALUES, AND PATIENT DOSAGE HAS BEEN CONFIRMED, SUGGEST FOLLOW UP TESTING OF ANTITHROMBIN III LEVELS.   Lactate dehydrogenase     Status: None   Collection Time: 03/13/16  1:35 AM  Result Value Ref Range   LDH 179 98 - 192 U/L  Protime-INR     Status: Abnormal   Collection Time: 03/13/16  1:35 AM  Result Value Ref Range   Prothrombin Time 15.4 (H) 11.4 - 15.2 seconds   INR 1.22   CBC     Status: Abnormal   Collection Time: 03/13/16  1:35 AM  Result Value Ref Range   WBC 8.4 4.0 - 10.5 K/uL   RBC 4.70 4.22 - 5.81 MIL/uL   Hemoglobin 13.3 13.0 - 17.0 g/dL   HCT 40.0 39.0 - 52.0 %   MCV 85.1 78.0 - 100.0 fL   MCH 28.3 26.0 - 34.0 pg   MCHC 33.3 30.0 - 36.0 g/dL   RDW 15.4 11.5 - 15.5 %   Platelets 148 (L) 150 - 400 K/uL  Basic metabolic panel     Status: Abnormal   Collection Time: 03/13/16  1:35 AM  Result Value Ref Range   Sodium 134 (L) 135 - 145 mmol/L   Potassium  3.7 3.5 - 5.1 mmol/L   Chloride 101 101 - 111 mmol/L   CO2 26 22 - 32 mmol/L   Glucose, Bld 112 (H) 65 - 99 mg/dL   BUN 9 6 -  20 mg/dL   Creatinine, Ser 1.13 0.61 - 1.24 mg/dL   Calcium 8.6 (L) 8.9 - 10.3 mg/dL   GFR calc non Af Amer >60 >60 mL/min   GFR calc Af Amer >60 >60 mL/min    Comment: (NOTE) The eGFR has been calculated using the CKD EPI equation. This calculation has not been validated in all clinical situations. eGFR's persistently <60 mL/min signify possible Chronic Kidney Disease.    Anion gap 7 5 - 15  Heparin level (unfractionated)     Status: Abnormal   Collection Time: 03/13/16  8:04 AM  Result Value Ref Range   Heparin Unfractionated 0.25 (L) 0.30 - 0.70 IU/mL    Comment:        IF HEPARIN RESULTS ARE BELOW EXPECTED VALUES, AND PATIENT DOSAGE HAS BEEN CONFIRMED, SUGGEST FOLLOW UP TESTING OF ANTITHROMBIN III LEVELS.   Heparin level (unfractionated)     Status: Abnormal   Collection Time: 03/13/16 10:36 PM  Result Value Ref Range   Heparin Unfractionated 0.20 (L) 0.30 - 0.70 IU/mL    Comment:        IF HEPARIN RESULTS ARE BELOW EXPECTED VALUES, AND PATIENT DOSAGE HAS BEEN CONFIRMED, SUGGEST FOLLOW UP TESTING OF ANTITHROMBIN III LEVELS.   Lactate dehydrogenase     Status: None   Collection Time: 03/14/16  2:02 AM  Result Value Ref Range   LDH 192 98 - 192 U/L  Protime-INR     Status: Abnormal   Collection Time: 03/14/16  2:02 AM  Result Value Ref Range   Prothrombin Time 15.4 (H) 11.4 - 15.2 seconds   INR 1.21   CBC     Status: Abnormal   Collection Time: 03/14/16  2:02 AM  Result Value Ref Range   WBC 8.9 4.0 - 10.5 K/uL   RBC 4.63 4.22 - 5.81 MIL/uL   Hemoglobin 13.1 13.0 - 17.0 g/dL   HCT 40.0 39.0 - 52.0 %   MCV 86.4 78.0 - 100.0 fL   MCH 28.3 26.0 - 34.0 pg   MCHC 32.8 30.0 - 36.0 g/dL   RDW 15.4 11.5 - 15.5 %   Platelets 149 (L) 150 - 400 K/uL  Comprehensive metabolic panel     Status: Abnormal   Collection Time: 03/14/16  2:02 AM    Result Value Ref Range   Sodium 135 135 - 145 mmol/L   Potassium 3.9 3.5 - 5.1 mmol/L   Chloride 102 101 - 111 mmol/L   CO2 22 22 - 32 mmol/L   Glucose, Bld 95 65 - 99 mg/dL   BUN 12 6 - 20 mg/dL   Creatinine, Ser 1.21 0.61 - 1.24 mg/dL   Calcium 8.8 (L) 8.9 - 10.3 mg/dL   Total Protein 6.5 6.5 - 8.1 g/dL   Albumin 3.6 3.5 - 5.0 g/dL   AST 15 15 - 41 U/L   ALT 13 (L) 17 - 63 U/L   Alkaline Phosphatase 79 38 - 126 U/L   Total Bilirubin 0.6 0.3 - 1.2 mg/dL   GFR calc non Af Amer >60 >60 mL/min   GFR calc Af Amer >60 >60 mL/min    Comment: (NOTE) The eGFR has been calculated using the CKD EPI equation. This calculation has not been validated in all clinical situations. eGFR's persistently <60 mL/min signify possible Chronic Kidney Disease.    Anion gap 11 5 - 15  Heparin level (unfractionated)     Status: Abnormal   Collection Time: 03/14/16  2:02 AM  Result Value  Ref Range   Heparin Unfractionated 0.17 (L) 0.30 - 0.70 IU/mL    Comment:        IF HEPARIN RESULTS ARE BELOW EXPECTED VALUES, AND PATIENT DOSAGE HAS BEEN CONFIRMED, SUGGEST FOLLOW UP TESTING OF ANTITHROMBIN III LEVELS.   Lactate dehydrogenase     Status: None   Collection Time: 03/15/16  3:53 AM  Result Value Ref Range   LDH 174 98 - 192 U/L  CBC     Status: Abnormal   Collection Time: 03/15/16  3:53 AM  Result Value Ref Range   WBC 7.9 4.0 - 10.5 K/uL   RBC 4.46 4.22 - 5.81 MIL/uL   Hemoglobin 12.8 (L) 13.0 - 17.0 g/dL   HCT 38.5 (L) 39.0 - 52.0 %   MCV 86.3 78.0 - 100.0 fL   MCH 28.7 26.0 - 34.0 pg   MCHC 33.2 30.0 - 36.0 g/dL   RDW 15.6 (H) 11.5 - 15.5 %   Platelets 145 (L) 150 - 400 K/uL  Basic metabolic panel     Status: Abnormal   Collection Time: 03/15/16  3:53 AM  Result Value Ref Range   Sodium 136 135 - 145 mmol/L   Potassium 4.0 3.5 - 5.1 mmol/L   Chloride 105 101 - 111 mmol/L   CO2 24 22 - 32 mmol/L   Glucose, Bld 91 65 - 99 mg/dL   BUN 10 6 - 20 mg/dL   Creatinine, Ser 1.02 0.61 -  1.24 mg/dL   Calcium 8.8 (L) 8.9 - 10.3 mg/dL   GFR calc non Af Amer >60 >60 mL/min   GFR calc Af Amer >60 >60 mL/min    Comment: (NOTE) The eGFR has been calculated using the CKD EPI equation. This calculation has not been validated in all clinical situations. eGFR's persistently <60 mL/min signify possible Chronic Kidney Disease.    Anion gap 7 5 - 15  Heparin level (unfractionated)     Status: Abnormal   Collection Time: 03/15/16  4:00 AM  Result Value Ref Range   Heparin Unfractionated 0.13 (L) 0.30 - 0.70 IU/mL    Comment:        IF HEPARIN RESULTS ARE BELOW EXPECTED VALUES, AND PATIENT DOSAGE HAS BEEN CONFIRMED, SUGGEST FOLLOW UP TESTING OF ANTITHROMBIN III LEVELS.   Protime-INR     Status: Abnormal   Collection Time: 03/15/16  4:00 AM  Result Value Ref Range   Prothrombin Time 15.4 (H) 11.4 - 15.2 seconds   INR 1.22   Lactate dehydrogenase     Status: None   Collection Time: 03/16/16  2:09 AM  Result Value Ref Range   LDH 179 98 - 192 U/L  CBC     Status: Abnormal   Collection Time: 03/16/16  2:09 AM  Result Value Ref Range   WBC 7.9 4.0 - 10.5 K/uL   RBC 4.37 4.22 - 5.81 MIL/uL   Hemoglobin 12.4 (L) 13.0 - 17.0 g/dL   HCT 37.8 (L) 39.0 - 52.0 %   MCV 86.5 78.0 - 100.0 fL   MCH 28.4 26.0 - 34.0 pg   MCHC 32.8 30.0 - 36.0 g/dL   RDW 15.8 (H) 11.5 - 15.5 %   Platelets 137 (L) 150 - 400 K/uL  Basic metabolic panel     Status: Abnormal   Collection Time: 03/16/16  2:09 AM  Result Value Ref Range   Sodium 137 135 - 145 mmol/L   Potassium 4.3 3.5 - 5.1 mmol/L   Chloride 105 101 - 111  mmol/L   CO2 25 22 - 32 mmol/L   Glucose, Bld 88 65 - 99 mg/dL   BUN 10 6 - 20 mg/dL   Creatinine, Ser 1.03 0.61 - 1.24 mg/dL   Calcium 8.8 (L) 8.9 - 10.3 mg/dL   GFR calc non Af Amer >60 >60 mL/min   GFR calc Af Amer >60 >60 mL/min    Comment: (NOTE) The eGFR has been calculated using the CKD EPI equation. This calculation has not been validated in all clinical  situations. eGFR's persistently <60 mL/min signify possible Chronic Kidney Disease.    Anion gap 7 5 - 15  Heparin level (unfractionated)     Status: Abnormal   Collection Time: 03/16/16  2:09 AM  Result Value Ref Range   Heparin Unfractionated <0.10 (L) 0.30 - 0.70 IU/mL    Comment:        IF HEPARIN RESULTS ARE BELOW EXPECTED VALUES, AND PATIENT DOSAGE HAS BEEN CONFIRMED, SUGGEST FOLLOW UP TESTING OF ANTITHROMBIN III LEVELS.   Protime-INR     Status: Abnormal   Collection Time: 03/16/16  2:09 AM  Result Value Ref Range   Prothrombin Time 16.2 (H) 11.4 - 15.2 seconds   INR 1.29   Lactate dehydrogenase     Status: None   Collection Time: 03/17/16  4:20 AM  Result Value Ref Range   LDH 168 98 - 192 U/L  Protime-INR     Status: Abnormal   Collection Time: 03/17/16  4:20 AM  Result Value Ref Range   Prothrombin Time 16.9 (H) 11.4 - 15.2 seconds   INR 1.36   CBC     Status: Abnormal   Collection Time: 03/17/16  4:20 AM  Result Value Ref Range   WBC 8.1 4.0 - 10.5 K/uL   RBC 4.55 4.22 - 5.81 MIL/uL   Hemoglobin 12.7 (L) 13.0 - 17.0 g/dL   HCT 39.7 39.0 - 52.0 %   MCV 87.3 78.0 - 100.0 fL   MCH 27.9 26.0 - 34.0 pg   MCHC 32.0 30.0 - 36.0 g/dL   RDW 15.8 (H) 11.5 - 15.5 %   Platelets 138 (L) 150 - 400 K/uL  Basic metabolic panel     Status: None   Collection Time: 03/17/16  4:20 AM  Result Value Ref Range   Sodium 138 135 - 145 mmol/L   Potassium 4.0 3.5 - 5.1 mmol/L   Chloride 105 101 - 111 mmol/L   CO2 25 22 - 32 mmol/L   Glucose, Bld 93 65 - 99 mg/dL   BUN 12 6 - 20 mg/dL   Creatinine, Ser 0.95 0.61 - 1.24 mg/dL   Calcium 8.9 8.9 - 10.3 mg/dL   GFR calc non Af Amer >60 >60 mL/min   GFR calc Af Amer >60 >60 mL/min    Comment: (NOTE) The eGFR has been calculated using the CKD EPI equation. This calculation has not been validated in all clinical situations. eGFR's persistently <60 mL/min signify possible Chronic Kidney Disease.    Anion gap 8 5 - 15  Heparin  level (unfractionated)     Status: Abnormal   Collection Time: 03/17/16  4:20 AM  Result Value Ref Range   Heparin Unfractionated 0.17 (L) 0.30 - 0.70 IU/mL    Comment:        IF HEPARIN RESULTS ARE BELOW EXPECTED VALUES, AND PATIENT DOSAGE HAS BEEN CONFIRMED, SUGGEST FOLLOW UP TESTING OF ANTITHROMBIN III LEVELS.   Heparin level (unfractionated)     Status: Abnormal  Collection Time: 03/18/16  3:57 AM  Result Value Ref Range   Heparin Unfractionated 0.22 (L) 0.30 - 0.70 IU/mL    Comment:        IF HEPARIN RESULTS ARE BELOW EXPECTED VALUES, AND PATIENT DOSAGE HAS BEEN CONFIRMED, SUGGEST FOLLOW UP TESTING OF ANTITHROMBIN III LEVELS.   CBC     Status: Abnormal   Collection Time: 03/18/16  3:57 AM  Result Value Ref Range   WBC 7.3 4.0 - 10.5 K/uL   RBC 4.58 4.22 - 5.81 MIL/uL   Hemoglobin 13.1 13.0 - 17.0 g/dL   HCT 39.5 39.0 - 52.0 %   MCV 86.2 78.0 - 100.0 fL   MCH 28.6 26.0 - 34.0 pg   MCHC 33.2 30.0 - 36.0 g/dL   RDW 15.8 (H) 11.5 - 15.5 %   Platelets 135 (L) 150 - 400 K/uL  Lactate dehydrogenase     Status: None   Collection Time: 03/18/16  3:57 AM  Result Value Ref Range   LDH 171 98 - 192 U/L  Protime-INR     Status: Abnormal   Collection Time: 03/18/16  3:57 AM  Result Value Ref Range   Prothrombin Time 18.0 (H) 11.4 - 15.2 seconds   INR 1.47   Lactate dehydrogenase     Status: None   Collection Time: 03/19/16  5:00 AM  Result Value Ref Range   LDH 179 98 - 192 U/L  Protime-INR     Status: Abnormal   Collection Time: 03/19/16  5:00 AM  Result Value Ref Range   Prothrombin Time 19.8 (H) 11.4 - 15.2 seconds   INR 1.66   CBC     Status: Abnormal   Collection Time: 03/19/16  5:00 AM  Result Value Ref Range   WBC 8.3 4.0 - 10.5 K/uL   RBC 4.71 4.22 - 5.81 MIL/uL   Hemoglobin 13.2 13.0 - 17.0 g/dL   HCT 41.1 39.0 - 52.0 %   MCV 87.3 78.0 - 100.0 fL   MCH 28.0 26.0 - 34.0 pg   MCHC 32.1 30.0 - 36.0 g/dL   RDW 15.9 (H) 11.5 - 15.5 %   Platelets 147 (L) 150  - 400 K/uL  Heparin level (unfractionated)     Status: Abnormal   Collection Time: 03/19/16  5:00 AM  Result Value Ref Range   Heparin Unfractionated 0.22 (L) 0.30 - 0.70 IU/mL    Comment:        IF HEPARIN RESULTS ARE BELOW EXPECTED VALUES, AND PATIENT DOSAGE HAS BEEN CONFIRMED, SUGGEST FOLLOW UP TESTING OF ANTITHROMBIN III LEVELS.   Lactate dehydrogenase     Status: Abnormal   Collection Time: 03/20/16  4:13 AM  Result Value Ref Range   LDH 195 (H) 98 - 192 U/L  Protime-INR     Status: Abnormal   Collection Time: 03/20/16  4:13 AM  Result Value Ref Range   Prothrombin Time 22.0 (H) 11.4 - 15.2 seconds   INR 1.90   CBC     Status: Abnormal   Collection Time: 03/20/16  4:13 AM  Result Value Ref Range   WBC 7.7 4.0 - 10.5 K/uL   RBC 4.82 4.22 - 5.81 MIL/uL   Hemoglobin 13.5 13.0 - 17.0 g/dL   HCT 41.4 39.0 - 52.0 %   MCV 85.9 78.0 - 100.0 fL   MCH 28.0 26.0 - 34.0 pg   MCHC 32.6 30.0 - 36.0 g/dL   RDW 15.7 (H) 11.5 - 15.5 %   Platelets 161  150 - 400 K/uL  Heparin level (unfractionated)     Status: Abnormal   Collection Time: 03/20/16  4:13 AM  Result Value Ref Range   Heparin Unfractionated 0.25 (L) 0.30 - 0.70 IU/mL    Comment:        IF HEPARIN RESULTS ARE BELOW EXPECTED VALUES, AND PATIENT DOSAGE HAS BEEN CONFIRMED, SUGGEST FOLLOW UP TESTING OF ANTITHROMBIN III LEVELS.   Basic metabolic panel     Status: Abnormal   Collection Time: 03/20/16  4:13 AM  Result Value Ref Range   Sodium 136 135 - 145 mmol/L   Potassium 4.2 3.5 - 5.1 mmol/L   Chloride 103 101 - 111 mmol/L   CO2 20 (L) 22 - 32 mmol/L   Glucose, Bld 85 65 - 99 mg/dL   BUN 13 6 - 20 mg/dL   Creatinine, Ser 1.02 0.61 - 1.24 mg/dL   Calcium 9.2 8.9 - 10.3 mg/dL   GFR calc non Af Amer >60 >60 mL/min   GFR calc Af Amer >60 >60 mL/min    Comment: (NOTE) The eGFR has been calculated using the CKD EPI equation. This calculation has not been validated in all clinical situations. eGFR's persistently <60  mL/min signify possible Chronic Kidney Disease.    Anion gap 13 5 - 15  POCT INR     Status: None   Collection Time: 03/23/16 12:00 AM  Result Value Ref Range   INR 1.6   CBC w/Diff/Platelet     Status: None   Collection Time: 03/24/16  1:48 PM  Result Value Ref Range   WBC 7.3 4.0 - 10.5 K/uL   RBC 4.83 4.22 - 5.81 MIL/uL   Hemoglobin 13.7 13.0 - 17.0 g/dL   HCT 41.8 39.0 - 52.0 %   MCV 86.5 78.0 - 100.0 fL   MCH 28.4 26.0 - 34.0 pg   MCHC 32.8 30.0 - 36.0 g/dL   RDW 15.0 11.5 - 15.5 %   Platelets 177 150 - 400 K/uL   Neutrophils Relative % 62 %   Neutro Abs 4.5 1.7 - 7.7 K/uL   Lymphocytes Relative 27 %   Lymphs Abs 2.0 0.7 - 4.0 K/uL   Monocytes Relative 9 %   Monocytes Absolute 0.6 0.1 - 1.0 K/uL   Eosinophils Relative 2 %   Eosinophils Absolute 0.1 0.0 - 0.7 K/uL   Basophils Relative 0 %   Basophils Absolute 0.0 0.0 - 0.1 K/uL  Lactate Dehydrogenase (LDH)     Status: Abnormal   Collection Time: 03/24/16  1:48 PM  Result Value Ref Range   LDH 230 (H) 98 - 192 U/L  INR/PT     Status: Abnormal   Collection Time: 03/24/16  1:48 PM  Result Value Ref Range   Prothrombin Time 19.6 (H) 11.4 - 15.2 seconds   INR 1.63   B Nat Peptide     Status: None   Collection Time: 03/24/16  1:48 PM  Result Value Ref Range   B Natriuretic Peptide 37.6 0.0 - 100.0 pg/mL  Comprehensive Metabolic Panel (CMET)     Status: None   Collection Time: 03/24/16  1:51 PM  Result Value Ref Range   Sodium 139 135 - 145 mmol/L   Potassium 3.9 3.5 - 5.1 mmol/L    Comment: SLIGHT HEMOLYSIS   Chloride 107 101 - 111 mmol/L   CO2 25 22 - 32 mmol/L   Glucose, Bld 96 65 - 99 mg/dL   BUN 10 6 - 20 mg/dL  Creatinine, Ser 1.14 0.61 - 1.24 mg/dL   Calcium 9.1 8.9 - 10.3 mg/dL   Total Protein 7.1 6.5 - 8.1 g/dL   Albumin 4.1 3.5 - 5.0 g/dL   AST 25 15 - 41 U/L   ALT 20 17 - 63 U/L   Alkaline Phosphatase 90 38 - 126 U/L   Total Bilirubin 0.8 0.3 - 1.2 mg/dL   GFR calc non Af Amer >60 >60 mL/min    GFR calc Af Amer >60 >60 mL/min    Comment: (NOTE) The eGFR has been calculated using the CKD EPI equation. This calculation has not been validated in all clinical situations. eGFR's persistently <60 mL/min signify possible Chronic Kidney Disease.    Anion gap 7 5 - 15  Protime-INR     Status: Abnormal   Collection Time: 03/25/16  2:45 AM  Result Value Ref Range   Prothrombin Time 21.2 (H) 11.4 - 15.2 seconds   INR 1.80   CBC     Status: Abnormal   Collection Time: 03/25/16  2:45 AM  Result Value Ref Range   WBC 7.8 4.0 - 10.5 K/uL   RBC 4.43 4.22 - 5.81 MIL/uL   Hemoglobin 12.6 (L) 13.0 - 17.0 g/dL   HCT 38.4 (L) 39.0 - 52.0 %   MCV 86.7 78.0 - 100.0 fL   MCH 28.4 26.0 - 34.0 pg   MCHC 32.8 30.0 - 36.0 g/dL   RDW 15.1 11.5 - 15.5 %   Platelets 173 150 - 400 K/uL  CBC     Status: Abnormal   Collection Time: 03/26/16 11:20 AM  Result Value Ref Range   WBC 7.2 4.0 - 10.5 K/uL   RBC 4.28 4.22 - 5.81 MIL/uL   Hemoglobin 12.3 (L) 13.0 - 17.0 g/dL   HCT 37.0 (L) 39.0 - 52.0 %   MCV 86.4 78.0 - 100.0 fL   MCH 28.7 26.0 - 34.0 pg   MCHC 33.2 30.0 - 36.0 g/dL   RDW 15.2 11.5 - 15.5 %   Platelets 166 150 - 400 K/uL  INR/PT     Status: Abnormal   Collection Time: 03/26/16 11:20 AM  Result Value Ref Range   Prothrombin Time 21.4 (H) 11.4 - 15.2 seconds   INR 1.83   Lactate Dehydrogenase (LDH)     Status: Abnormal   Collection Time: 03/26/16 11:20 AM  Result Value Ref Range   LDH 205 (H) 98 - 192 U/L  Protime-INR     Status: None   Collection Time: 03/30/16  9:23 AM  Result Value Ref Range   Prothrombin Time 13.8 11.4 - 15.2 seconds   INR 1.06   Lactate dehydrogenase     Status: None   Collection Time: 03/30/16  9:23 AM  Result Value Ref Range   LDH 176 98 - 192 U/L  Protime-INR     Status: None   Collection Time: 04/02/16  9:25 AM  Result Value Ref Range   Prothrombin Time 15.2 11.4 - 15.2 seconds   INR 1.19   Lactate dehydrogenase     Status: None   Collection Time:  04/02/16  9:25 AM  Result Value Ref Range   LDH 186 98 - 192 U/L  Lactate dehydrogenase     Status: Abnormal   Collection Time: 04/06/16  9:29 AM  Result Value Ref Range   LDH 225 (H) 98 - 192 U/L  Protime-INR     Status: Abnormal   Collection Time: 04/06/16  9:29 AM  Result  Value Ref Range   Prothrombin Time 25.7 (H) 11.4 - 15.2 seconds   INR 2.30   CBC     Status: None   Collection Time: 04/13/16 10:39 AM  Result Value Ref Range   WBC 7.1 4.0 - 10.5 K/uL   RBC 4.56 4.22 - 5.81 MIL/uL   Hemoglobin 13.0 13.0 - 17.0 g/dL   HCT 39.2 39.0 - 52.0 %   MCV 86.0 78.0 - 100.0 fL   MCH 28.5 26.0 - 34.0 pg   MCHC 33.2 30.0 - 36.0 g/dL   RDW 14.6 11.5 - 15.5 %   Platelets 189 150 - 400 K/uL  Lactate Dehydrogenase (LDH)     Status: Abnormal   Collection Time: 04/13/16 10:39 AM  Result Value Ref Range   LDH 225 (H) 98 - 192 U/L  INR/PT     Status: Abnormal   Collection Time: 04/13/16 10:39 AM  Result Value Ref Range   Prothrombin Time 24.2 (H) 11.4 - 15.2 seconds   INR 2.13   Protime-INR     Status: Abnormal   Collection Time: 04/29/16  9:25 AM  Result Value Ref Range   Prothrombin Time 21.1 (H) 11.4 - 15.2 seconds   INR 0.27   Basic metabolic panel     Status: None   Collection Time: 04/29/16  9:25 AM  Result Value Ref Range   Sodium 139 135 - 145 mmol/L   Potassium 3.7 3.5 - 5.1 mmol/L   Chloride 106 101 - 111 mmol/L   CO2 25 22 - 32 mmol/L   Glucose, Bld 90 65 - 99 mg/dL   BUN 9 6 - 20 mg/dL   Creatinine, Ser 1.02 0.61 - 1.24 mg/dL   Calcium 9.1 8.9 - 10.3 mg/dL   GFR calc non Af Amer >60 >60 mL/min   GFR calc Af Amer >60 >60 mL/min    Comment: (NOTE) The eGFR has been calculated using the CKD EPI equation. This calculation has not been validated in all clinical situations. eGFR's persistently <60 mL/min signify possible Chronic Kidney Disease.    Anion gap 8 5 - 15  CBC     Status: Abnormal   Collection Time: 04/29/16  9:25 AM  Result Value Ref Range   WBC 7.0 4.0  - 10.5 K/uL   RBC 4.61 4.22 - 5.81 MIL/uL   Hemoglobin 12.6 (L) 13.0 - 17.0 g/dL   HCT 39.3 39.0 - 52.0 %   MCV 85.2 78.0 - 100.0 fL   MCH 27.3 26.0 - 34.0 pg   MCHC 32.1 30.0 - 36.0 g/dL   RDW 14.9 11.5 - 15.5 %   Platelets 150 150 - 400 K/uL  Lactate dehydrogenase     Status: Abnormal   Collection Time: 04/29/16  9:25 AM  Result Value Ref Range   LDH 232 (H) 98 - 192 U/L  Protime-INR     Status: Abnormal   Collection Time: 05/06/16 10:56 AM  Result Value Ref Range   Prothrombin Time 22.6 (H) 11.4 - 15.2 seconds   INR 1.96   CBC     Status: None   Collection Time: 05/12/16  9:27 AM  Result Value Ref Range   WBC 6.5 4.0 - 10.5 K/uL   RBC 4.86 4.22 - 5.81 MIL/uL   Hemoglobin 13.3 13.0 - 17.0 g/dL   HCT 41.0 39.0 - 52.0 %   MCV 84.4 78.0 - 100.0 fL   MCH 27.4 26.0 - 34.0 pg   MCHC 32.4 30.0 - 36.0  g/dL   RDW 14.6 11.5 - 15.5 %   Platelets 177 150 - 400 K/uL  Protime-INR     Status: Abnormal   Collection Time: 05/12/16  9:27 AM  Result Value Ref Range   Prothrombin Time 25.0 (H) 11.4 - 15.2 seconds   INR 2.22   Lactate dehydrogenase     Status: Abnormal   Collection Time: 05/12/16  9:27 AM  Result Value Ref Range   LDH 226 (H) 98 - 192 U/L  Comprehensive Metabolic Panel (CMET)     Status: Abnormal   Collection Time: 05/12/16  9:27 AM  Result Value Ref Range   Sodium 141 135 - 145 mmol/L   Potassium 3.3 (L) 3.5 - 5.1 mmol/L   Chloride 106 101 - 111 mmol/L   CO2 23 22 - 32 mmol/L   Glucose, Bld 89 65 - 99 mg/dL   BUN 7 6 - 20 mg/dL   Creatinine, Ser 1.01 0.61 - 1.24 mg/dL   Calcium 9.0 8.9 - 10.3 mg/dL   Total Protein 7.0 6.5 - 8.1 g/dL   Albumin 4.0 3.5 - 5.0 g/dL   AST 21 15 - 41 U/L   ALT 17 17 - 63 U/L   Alkaline Phosphatase 88 38 - 126 U/L   Total Bilirubin 0.5 0.3 - 1.2 mg/dL   GFR calc non Af Amer >60 >60 mL/min   GFR calc Af Amer >60 >60 mL/min    Comment: (NOTE) The eGFR has been calculated using the CKD EPI equation. This calculation has not been  validated in all clinical situations. eGFR's persistently <60 mL/min signify possible Chronic Kidney Disease.    Anion gap 12 5 - 15    ASSESSMENT AND PLAN:   1) Chronic systolic HF, s/p HMII LVAD implant 05/2012.  - Listed 1A at [facility]. Also with visit to [facility] for dual listing. NYHA II Volume status mildly elevated in the setting of high salt diet.  I have asked him to take 40 mg po lasix today.   - He is not on a BB. Intolerant BB.  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   Dressing changed using sterile technique. Driveline wound continues to improve.  Continue aquacel ag weekly. May need twice weekly dressing based on the amount of exudate.  . Continue ceftriaxone 2 grams every 12 hours per ID. Plan for ceftriaxone for another month per Dr Johnnye Sima. PICC site ok. Continue AHC.    3) HTN-  - Improved. Continue current regimen.    3) Hyperthyroidism - Amio induced. Follows with Dr. Forde Dandy. Amio stopped. Recent TSH 02/2016 1.422   4) NSVT/VT  - Off amio. On mexilitene. - ICD now at EOL. No plans to replace at this time. Reviewed 04/14/16 continue current plan.  5) PVCs - Resolved.   6) Anticoagulation with coumadin  - Continue coumadin and ASA 325 mg for LVAD. No bleeding problems. INR 2.22 Therapeutic.  See anticoagulation flow sheet. No bleeding problems.  7) LVAD  Vad indices stable.   Follow up next week for dressing change wit VAD coordinator. Plan to continue Aquacel AG.   Amy Clegg NP-C  11:02 AM

## 2016-05-19 ENCOUNTER — Ambulatory Visit (HOSPITAL_COMMUNITY)
Admission: RE | Admit: 2016-05-19 | Discharge: 2016-05-19 | Disposition: A | Discharge status: Home / Self Care | Payer: Medicaid Other | Source: Ambulatory Visit | Attending: Cardiology | Admitting: Cardiology

## 2016-05-19 ENCOUNTER — Ambulatory Visit (HOSPITAL_COMMUNITY): Payer: Self-pay | Admitting: Pharmacist

## 2016-05-19 DIAGNOSIS — Z95811 Presence of heart assist device: Secondary | ICD-10-CM

## 2016-05-19 DIAGNOSIS — I5022 Chronic systolic (congestive) heart failure: Secondary | ICD-10-CM

## 2016-05-19 DIAGNOSIS — Z5181 Encounter for therapeutic drug level monitoring: Secondary | ICD-10-CM | POA: Diagnosis not present

## 2016-05-19 LAB — BASIC METABOLIC PANEL
Anion gap: 10 (ref 5–15)
BUN: 10 mg/dL (ref 6–20)
CO2: 26 mmol/L (ref 22–32)
Calcium: 8.9 mg/dL (ref 8.9–10.3)
Chloride: 102 mmol/L (ref 101–111)
Creatinine, Ser: 1.17 mg/dL (ref 0.61–1.24)
GFR calc Af Amer: >60 mL/min (ref >60)
GLUCOSE: 97 mg/dL (ref 65–99)
POTASSIUM: 4.1 mmol/L (ref 3.5–5.1)
Sodium: 138 mmol/L (ref 135–145)

## 2016-05-19 LAB — PROTIME-INR
INR: 1.49
Prothrombin Time: 18.2 seconds — ABNORMAL HIGH (ref 11.4–15.2)

## 2016-05-19 MED ORDER — WARFARIN SODIUM 5 MG PO TABS: 7.5000 mg | ORAL_TABLET | Freq: Every day | ORAL | 5 refills | Status: DC

## 2016-05-19 NOTE — Progress Notes (Signed)
CSW  Met with patient in the clinic. Patient reports he tried multiple sites for assistance with obtaining propane fuel to heat his home. Patient requesting assistance with propane. CSW assessed financial concerns an assisted with fuel needs. Patient grateful for the assistance and denies any other concerns at this time. CSW will continue to follow through the VAD clinic for any needs. Raquel Sarna, [place], [place]

## 2016-05-19 NOTE — Progress Notes (Signed)
Patient presents to clinic today for drive line exit wound care. Existing VAD dressing removed and site care performed using sterile technique. Drive line exit site cleaned with Chlora prep applicators x 2, allowed to dry, and Sorbaview dressing with aquacell gauze strip re-applied. Exit site red and draining yellow colored exudate, the velour is fully implanted at exit site.Drive line anchor re-applied. Pt denies fever or chills. Complains of a recent cold.     Annual maintenance completed for power module and universal Magazine features editor today in clinic. Passed inspection per Midwife. New 20' patient cable and SLA back up battery provided for power module as recommended by manufacturer.   Return on Monday for another dressing change per standard of care. Plan to start changing the dressing twice weekly. Will discuss driveline exit site with provider.  Marcellus Scott RN, VAD Coordinator 24/7 pager [phone]

## 2016-05-21 ENCOUNTER — Ambulatory Visit (HOSPITAL_COMMUNITY): Payer: Self-pay | Admitting: Pharmacist

## 2016-05-21 ENCOUNTER — Encounter: Payer: Self-pay | Admitting: Family Medicine

## 2016-05-21 ENCOUNTER — Ambulatory Visit (HOSPITAL_COMMUNITY)
Admission: RE | Admit: 2016-05-21 | Discharge: 2016-05-21 | Disposition: A | Discharge status: Home / Self Care | Payer: Medicaid Other | Source: Ambulatory Visit | Attending: Internal Medicine | Admitting: Internal Medicine

## 2016-05-21 ENCOUNTER — Ambulatory Visit (INDEPENDENT_AMBULATORY_CARE_PROVIDER_SITE_OTHER): Payer: Medicaid Other | Admitting: Family Medicine

## 2016-05-21 VITALS — HR 69 | Temp 97.9°F | Ht 74.0 in | Wt 256.6 lb

## 2016-05-21 DIAGNOSIS — Z95811 Presence of heart assist device: Secondary | ICD-10-CM

## 2016-05-21 DIAGNOSIS — G8929 Other chronic pain: Secondary | ICD-10-CM

## 2016-05-21 DIAGNOSIS — M25511 Pain in right shoulder: Secondary | ICD-10-CM

## 2016-05-21 LAB — PROTIME-INR
INR: 1.6
PROTHROMBIN TIME: 19.2 s — AB (ref 11.4–15.2)

## 2016-05-21 MED FILL — WARFARIN SODIUM 5 MG TABLET: 5 | 30 days supply | Qty: 30 | Fill #1

## 2016-05-21 NOTE — Patient Instructions (Addendum)
It is ok to take your Tramadol as prescribed for your pain.  I have sent in a referral to sports medicine.  I suspect they will want to ultrasound your shoulder.  If you do not hear from them next week, call me.   Rotator Cuff Injury Rotator cuff injury is any type of injury to the set of muscles and tendons that make up the stabilizing unit of your shoulder. This unit holds the ball of your upper arm bone (humerus) in the socket of your shoulder blade (scapula). What are the causes? Injuries to your rotator cuff most commonly come from sports or activities that cause your arm to be moved repeatedly over your head. Examples of this include throwing, weight lifting, swimming, or racquet sports. Long lasting (chronic) irritation of your rotator cuff can cause soreness and swelling (inflammation), bursitis, and eventual damage to your tendons, such as a tear (rupture). What are the signs or symptoms? Acute rotator cuff tear:  Sudden tearing sensation followed by severe pain shooting from your upper shoulder down your arm toward your elbow.  Decreased range of motion of your shoulder because of pain and muscle spasm.  Severe pain.  Inability to raise your arm out to the side because of pain and loss of muscle power (large tears). Chronic rotator cuff tear:  Pain that usually is worse at night and may interfere with sleep.  Gradual weakness and decreased shoulder motion as the pain worsens.  Decreased range of motion. Rotator cuff tendinitis:  Deep ache in your shoulder and the outside upper arm over your shoulder.  Pain that comes on gradually and becomes worse when lifting your arm to the side or turning it inward. How is this diagnosed? Rotator cuff injury is diagnosed through a medical history, physical exam, and imaging exam. The medical history helps determine the type of rotator cuff injury. Your health care provider will look at your injured shoulder, feel the injured area, and ask  you to move your shoulder in different positions. X-ray exams typically are done to rule out other causes of shoulder pain, such as fractures. MRI is the exam of choice for the most severe shoulder injuries because the images show muscles and tendons. How is this treated? Chronic tear:  Medicine for pain, such as acetaminophen or ibuprofen.  Physical therapy and range-of-motion exercises may be helpful in maintaining shoulder function and strength.  Steroid injections into your shoulder joint.  Surgical repair of the rotator cuff if the injury does not heal with noninvasive treatment. Acute tear:  Anti-inflammatory medicines such as ibuprofen and naproxen to help reduce pain and swelling.  A sling to help support your arm and rest your rotator cuff muscles. Long-term use of a sling is not advised. It may cause significant stiffening of the shoulder joint.  Surgery may be considered within a few weeks, especially in younger, active people, to return the shoulder to full function.  Indications for surgical treatment include the following:  Age younger than 60 years.  Rotator cuff tears that are complete.  Physical therapy, rest, and anti-inflammatory medicines have been used for 6-8 weeks, with no improvement.  Employment or sporting activity that requires constant shoulder use. Tendinitis:  Anti-inflammatory medicines such as ibuprofen and naproxen to help reduce pain and swelling.  A sling to help support your arm and rest your rotator cuff muscles. Long-term use of a sling is not advised. It may cause significant stiffening of the shoulder joint.  Severe tendinitis may require:  Steroid injections into your shoulder joint.  Physical therapy.  Surgery. Follow these instructions at home:  Apply ice to your injury:  Put ice in a plastic bag.  Place a towel between your skin and the bag.  Leave the ice on for 20 minutes, 2-3 times a day.  If you have a shoulder  immobilizer (sling and straps), wear it until told otherwise by your health care provider.  You may want to sleep on several pillows or in a recliner at night to lessen swelling and pain.  Only take over-the-counter or prescription medicines for pain, discomfort, or fever as directed by your health care provider.  Do simple hand squeezing exercises with a soft rubber ball to decrease hand swelling. Contact a health care provider if:  Your shoulder pain increases, or new pain or numbness develops in your arm, hand, or fingers.  Your hand or fingers are colder than your other hand. Get help right away if:  Your arm, hand, or fingers are numb or tingling.  Your arm, hand, or fingers are increasingly swollen and painful, or they turn white or blue. This information is not intended to replace advice given to you by your health care provider. Make sure you discuss any questions you have with your health care provider. Document Released: 03/19/2000 Document Revised: 08/28/2015 Document Reviewed: 11/01/2012 Elsevier Interactive Patient Education  2017 [employer].

## 2016-05-21 NOTE — Progress Notes (Signed)
   Subjective: CC: shoulder pain LPN:[id number] C Krapp is a 61 y.o. male presenting to clinic today for same day appointment. PCP: Almon Hercules, MD Concerns today include:  1. Left shoulder pain He reports that he has had left shoulder pain intermittently for years.  Had an injury in basketball to shoulder as a youth.  He was seen in the ED 3 months ago and was told he had gout.  He has been doing PT at home.  He reports that he was prescribed Tramadol by Dr Gala Romney in August.  This helps but he did not take recently because he has been sick and didn't want to mix cold medication with this medication.  Pain is described as stabbing.  He notes that lifting his left arm makes pain worse.  No surgeries on shoulder in past.  Does not think that shoulder has been evaluated before.  Allergies  Allergen Reactions  . Metoprolol Other (See Comments)    Passes out  . Imdur [Isosorbide Nitrate] Other (See Comments)    HEADACHE--REACTION IS SIDE EFFECT OF NITRATES    Social Hx reviewed. MedHx, current medications and allergies reviewed.  Please see EMR. ROS: Per HPI  Objective: Office vital signs reviewed. Pulse 69   Temp 97.9 F (36.6 C) (Oral)   Ht 6\' 2"  (1.88 m)   Wt 256 lb 9.6 oz (116.4 kg)   SpO2 99%   BMI 32.95 kg/m   Physical Examination:  General: Awake, alert, well nourished, No acute distress Extremities: warm, well perfused, No edema, cyanosis or clubbing; +2 pulses bilaterally MSK: normal gait and normal station  Right shoulder: has full AROM but has significant difficulty raising shoulder from 45 degrees to180 (+painful arc).  He also has difficulty with flexion of the shoulder past 90 degrees.  He can IR and ER without difficulty.  No bony abnormalities seen.  No skin changes.  Scapular muscles seem somewhat atrophied L>R.  He has decreased strength in ER/ IR of shoulder.  + empty can w/ marked weakness appreciated. Skin: dry; intact; no rashes or lesions Neuro: Light touch  sensation grossly in tact.  Assessment/ Plan: 61 y.o. male   1. Chronic right shoulder pain.  06/2015 left shoulder xray report reviewed.  Some degenerative changes on that film but nothing that would explain patient's weakness.  I suspect that he has some form of rotator cuff dysfunction/ possibly a poorly healed tear.  Nothing on exam to suggest adhesive capsulitis.  Could consider c-spine pathology but no over radiculopathy.  Discussed referral to [facility] for ultrasound and HEP.  Patient amenable to this. - Continue home Tramadol prn pain - Ambulatory referral to Sports Medicine - Return precautions reviewed  Follow up with PCP prn  Raliegh Ip, DO PGY-3, [facility]

## 2016-05-21 NOTE — Progress Notes (Signed)
Driveline dressing change completed on patient. Gauze dressing presented with old half-dollar sized blood on outside of dressing. Patient denies any tugging/trauma to line that he can remember. Does state he has been doing lovenox shots past couple of days.  INR was obtained during today's visit. Daily dressing change done using sterile technique, thin, watery yellow and pink exudate present at site.  Site cleaned with CHG swabs, site clean and without drainage, odor, swelling.  Surrounding skin and tissue without presence of infection or trauma.  Small topical new granulated tissue proximal to driveline insertion site with slight superficial bleeding that may explain blood on old dressing. Pictures included. New sterile daily dressing applied with anchor. Patient denies fever, sweats, chills, fatigue, and reports feeling well. Advised to page VAD pager for any issues and to watch dressing over weekend for excess bleeding, saturation, or other concerning s/s that would indicate bleed/infection. Aware and agreeable.     Ave Filter, RN

## 2016-05-24 ENCOUNTER — Ambulatory Visit (HOSPITAL_COMMUNITY)
Admission: RE | Admit: 2016-05-24 | Discharge: 2016-05-24 | Disposition: A | Discharge status: Home / Self Care | Payer: Medicaid Other | Source: Ambulatory Visit | Attending: Cardiology | Admitting: Cardiology

## 2016-05-24 ENCOUNTER — Ambulatory Visit (HOSPITAL_COMMUNITY): Payer: Self-pay | Admitting: Pharmacist

## 2016-05-24 VITALS — BP 110/75 | HR 69 | Wt 258.0 lb

## 2016-05-24 DIAGNOSIS — I10 Essential (primary) hypertension: Secondary | ICD-10-CM

## 2016-05-24 DIAGNOSIS — A498 Other bacterial infections of unspecified site: Secondary | ICD-10-CM

## 2016-05-24 DIAGNOSIS — Z5181 Encounter for therapeutic drug level monitoring: Secondary | ICD-10-CM | POA: Diagnosis not present

## 2016-05-24 DIAGNOSIS — I509 Heart failure, unspecified: Secondary | ICD-10-CM | POA: Diagnosis not present

## 2016-05-24 DIAGNOSIS — I110 Hypertensive heart disease with heart failure: Secondary | ICD-10-CM | POA: Diagnosis not present

## 2016-05-24 DIAGNOSIS — T829XXA Unspecified complication of cardiac and vascular prosthetic device, implant and graft, initial encounter: Secondary | ICD-10-CM

## 2016-05-24 DIAGNOSIS — Z95810 Presence of automatic (implantable) cardiac defibrillator: Secondary | ICD-10-CM | POA: Diagnosis not present

## 2016-05-24 DIAGNOSIS — T829XXD Unspecified complication of cardiac and vascular prosthetic device, implant and graft, subsequent encounter: Secondary | ICD-10-CM

## 2016-05-24 DIAGNOSIS — I272 Pulmonary hypertension, unspecified: Secondary | ICD-10-CM | POA: Insufficient documentation

## 2016-05-24 DIAGNOSIS — Z95811 Presence of heart assist device: Secondary | ICD-10-CM | POA: Diagnosis not present

## 2016-05-24 DIAGNOSIS — I428 Other cardiomyopathies: Secondary | ICD-10-CM | POA: Diagnosis not present

## 2016-05-24 DIAGNOSIS — Z79899 Other long term (current) drug therapy: Secondary | ICD-10-CM | POA: Diagnosis not present

## 2016-05-24 DIAGNOSIS — Z7982 Long term (current) use of aspirin: Secondary | ICD-10-CM | POA: Insufficient documentation

## 2016-05-24 DIAGNOSIS — Z7901 Long term (current) use of anticoagulants: Secondary | ICD-10-CM

## 2016-05-24 LAB — BASIC METABOLIC PANEL
Anion gap: 10 (ref 5–15)
BUN: 10 mg/dL (ref 6–20)
CHLORIDE: 104 mmol/L (ref 101–111)
CO2: 24 mmol/L (ref 22–32)
Calcium: 8.8 mg/dL — ABNORMAL LOW (ref 8.9–10.3)
Creatinine, Ser: 1.01 mg/dL (ref 0.61–1.24)
GFR calc Af Amer: >60 mL/min (ref >60)
GFR calc non Af Amer: >60 mL/min (ref >60)
Glucose, Bld: 85 mg/dL (ref 65–99)
POTASSIUM: 4.2 mmol/L (ref 3.5–5.1)
SODIUM: 138 mmol/L (ref 135–145)

## 2016-05-24 LAB — PROTIME-INR
INR: 2.41
PROTHROMBIN TIME: 26.7 s — AB (ref 11.4–15.2)

## 2016-05-24 LAB — LACTATE DEHYDROGENASE: LDH: 234 U/L — ABNORMAL HIGH (ref 98–192)

## 2016-05-24 NOTE — Addendum Note (Signed)
Encounter addended by: Thayer Headings, RN on: 05/24/2016 11:36 AM<BR>    Actions taken: Sign clinical note

## 2016-05-24 NOTE — Progress Notes (Signed)
Patient presents for weekly dressing change and labs  follow up in [facility] Clinic today. Reports no problems with VAD equipment or concerns with drive line.  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  1A [facility] &1B [facility].      VAD interrogation & Equipment Management: Speed:9400 Flow: 4.2 Power:5.6 w    PI:7  Alarms: no clinical alarms Events: 3/5 PI events daily  Fixed speed 9400 Low speed limit: 8800  Primary Controller:  Replace back up battery in 27months. Back up controller:   Replace back up battery in 16 months.  Annual Equipment Maintenance on UBC/PM was performed on 05/06/2016.   I reviewed the LVAD parameters from today and compared the results to the patient's prior recorded data. LVAD interrogation was NEGATIVE for significant power changes, NEGATIVE for clinical alarms and STABLE for PI events/speed drops. No programming changes were made and pump is functioning within specified parameters. Pt is performing daily controller and system monitor self tests along with completing weekly and monthly maintenance for LVAD equipment.  LVAD equipment check completed and is in good working order. Back-up equipment present. Charged back up battery and performed self-test on equipment.   Exit Site Care: Driveline dressing changed per Tonye Becket NP in clinic today.    Significant Events on VAD Support:  08/26/15 >>Serratia marcescens (rare) >>Cipro 500 mg bid x 7 days  09/29/15 >> Serratia marcescens (abundant) >> Cipro 500 mg BID x14d 11/20/15 >> Serratia marcescens (few) Admitted IV ceftriaxone >> cipro 500 mg twice daily x 30 days 02/03/16 >> Serratia species >> Cipro 500 mg BID x 14d; referral to ID 02/13/16 03/11/2016  >> driveline debridement, IV antibiotics  Device:Medtronic Dual Therapies: on Last check: 08/05/2014 (assessed Optivol 02/05/16)   BP & Labs:  MAP110 - Doppler is reflecting modified systolic  No S/S of bleeding. Specifically denies melena/BRBPR or nosebleeds.  LDH stable at 234 with established baseline of 170- 250. Denies tea-colored urine. No power elevations noted on interrogation.   Return to clinic Thursday for dressing change.  Marcellus Scott RN VAD Coordinator   Office: [phone] 24/7 Emergency VAD Pager: [phone]

## 2016-05-24 NOTE — Addendum Note (Signed)
Encounter addended by: Thayer Headings, RN on: 05/24/2016 10:58 AM<BR>    Actions taken: Pend clinical note

## 2016-05-24 NOTE — Patient Instructions (Signed)
Return to clinic Thursday as previously scheduled

## 2016-05-25 NOTE — Addendum Note (Signed)
Encounter addended by: Sherald Hess, NP on: 05/25/2016  2:33 PM<BR>    Actions taken: Vitals modified, Chief Complaint modified, Visit Navigator Flowsheet section accepted, LOS modified, Follow-up modified, Charge Capture section accepted, Sign clinical note, Visit diagnoses modified

## 2016-05-25 NOTE — Progress Notes (Signed)
LVAD CLINIC NOTE  Patient ID: Robert [name], male   DOB: [birth year], 61 y.o.   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 status post dual chamber Medtronic ICD implanted April 2011. Cath 2011 showed normal coronaries. He also has a history of VTach .   Amio recently stopped due to thyrotoxicity. Placed on prednisone and tapazole. Had VT off amio and mexilitene started.  Had [place] on 8/17 as part of dual listing process at [facility]. Normal filling pressures and cardiac output.   Drive Line Infeciton  08/26/15 >>Serratia marcescens (rare) >>Cipro 500 mg bid x 7 days  09/29/15 >> Serratia marcescens (abundant) >>Cipro 500 mg BID x14d 11/20/15 >>Serratia marcescens (few) Admitted IV ceftriaxone >>cipro 500 mg twice daily x 30 days 02/03/16 >> Serratia species >> Cipro 500 mg BID x 14d; referral to ID  03/12/2016 Surgical Debridement with VAC placement. Full thickness 5x8x6 cm. 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 Plan to continued on cefepime 1 gram IV every 8 hours for 12 weeks.  03/16/2016 PICC line placed for IV antibiotics.  03/17/2016 VAC reapplied with 1 gram of A Cell applied. 03/25/2016 VAC removed Damp saline dressing applied.  04/02/2016 Acell applied  04/20/2016 Acell applied.  05/06/2016 Aquacel Ag started.   Admitted to [facility] on 12/5 for heparin bridge and surgical debridement of persistent drive line infection. VAC applied with A cell. VAC maintained seal at 75 mm hg continuous therapy. Plan for ceftriaxone for 6 wks until 04/23/16. Will also need VAC changes weekly with ACell. [facility] following for home antibiotics.   Today he returns for LVAD follow up and dressing change. Last week he had volume overload so he was instructed to take 40 mg of lasix x1. Says he had a good  response. Also started on twice weekly dressing changes with Aquacel Ag. Denies SOB/PND/Orthopnea. No BRBPR. No problems with PICC. No fever or chills. Appetite good.Continue [facility] for home antibiotics. No PICC issues.  Denies R upper arm pain.  No bleeding problems.   Recent [facility]HC on 11/06/15 as part of Transplant process RA = 6 RV =  18/3/6 PA = 19/7 (12) PCW = 8 Fick cardiac output/index = 6.1/2.6 PVR = .0.7 WU Ao sat = 98% PA sat = 70%, 69%:  I reviewed the LVAD parameters from today, and compared the results to the patient's prior recorded data.  No programming changes were made.  The LVAD is functioning within specified parameters.  The patient performs LVAD self-test daily.  LVAD interrogation was negative for any significant power changes, alarms or PI events/speed drops.  LVAD equipment check completed and is in good working order.  Back-up equipment present.   LVAD education done on emergency procedures and precautions and reviewed exit site care.    Past Medical History:  Diagnosis Date  . AICD (automatic cardioverter/defibrillator) present 2011  . Bronchitis   . Congestive heart failure ([place]) 2011  . Hypertension 2000  . LVAD (left ventricular assist device) present ([place]) 2014  . Pneumonia   . Pulmonary hypertension     Current Outpatient Prescriptions  Medication Sig Dispense Refill  . amLODipine (NORVASC) 5 MG tablet Take 1 tablet (5 mg total) by mouth daily. 14 tablet 0  . aspirin EC 325 MG tablet Take 1 tablet (  325 mg total) by mouth daily. 30 tablet 6  . enoxaparin (LOVENOX) 100 MG/ML injection Inject 100 mg into the skin every 12 (twelve) hours.    . hydrALAZINE (APRESOLINE) 50 MG tablet Take 0.5 tablets (25 mg total) by mouth 2 (two) times daily. 30 tablet 6  . mexiletine (MEXITIL) 150 MG capsule Take 1 capsule (150 mg total) by mouth 2 (two) times daily. 60 capsule 5  . Multiple Vitamin (MULTIVITAMIN) tablet Take 1 tablet by mouth daily.    . pantoprazole (PROTONIX) 40  MG tablet Take 1 tablet (40 mg total) by mouth daily. 30 tablet 3  . sacubitril-valsartan (ENTRESTO) 97-103 MG Take 1 tablet by mouth 2 (two) times daily. 60 tablet 3  . traMADol (ULTRAM) 50 MG tablet Take 1 tablet (50 mg total) by mouth every 8 (eight) hours as needed for moderate pain. 30 tablet 5  . warfarin (COUMADIN) 5 MG tablet Take 1.5-2 tablets (7.5-10 mg total) by mouth daily. Take 7.5 mg (1 and 1/2 tablets) daily except 10 mg ( 2 tablets) on Thursday or as directed by VAD clinic based on weekly INR results. 44 tablet 5  . allopurinol (ZYLOPRIM) 300 MG tablet Take 1 tablet (300 mg total) by mouth daily. 30 tablet 6  . furosemide (LASIX) 40 MG tablet Take 1 tablet (40 mg total) by mouth daily as needed. Take as directed by clinic 30 tablet 3   No current facility-administered medications for this encounter.     Metoprolol and Imdur [isosorbide nitrate]  REVIEW OF SYSTEMS: All systems negative except as listed in HPI, PMH and Problem list. Vital Signs:  Doppler Pressure 110              Automatc BP: 110/75 (87)  HR:69 SPO2: 98%  VAD Indication: Bridge to Transplant- Evaluation completed at [facility] with IB listing 05/30/2013; dual listed with [facility] 12/04/2014.  1A [facility] &1B [place].    VAD interrogation & Equipment Management: Speed:9400 Flow: 4.2 Power:5.6 PI: 7 Alarms: no alarms  Events: 3-5 per day Fixed speed 9400 Low speed limit: 8800  Vitals:   05/25/16 1426  BP: 110/75  Pulse: 69   Filed Weights   05/25/16 1426  Weight: 258 lb (117 kg)   Physical Exam: GENERAL: NAD, male who presents to clinic. Walked in the clinic without difficulty  HEENT: normal  NECK: Supple, JVP flat;  2+ bilaterally, no bruits.  No lymphadenopathy or thyromegaly appreciated.  CARDIAC:  Mechanical heart sounds with LVAD hum present.  LUNGS:  CTAB on room air  ABDOMEN:  Soft , NT, ND, + bowel sounds x4.     LVAD exit site: Hypergranulation noted around the medial aspect of  driveline. Min exudate. No odor. Driveline has multiple twists.  EXTREMITIES:  Warm No cyanosis. RUE PICC ok. RUE non tender. R and LLE no edema.   NEUROLOGIC:  A&O x3. Gait steady.  No aphasia.  No dysarthria.  Affect pleasant.     Recent Results (from the past 2160 hour(s))  CULTURE, ROUTINE-ABSCESS     Status: None   Collection Time: 02/25/16  3:30 PM  Result Value Ref Range   Culture Moderate SERRATIA MARCESCENS    Gram Stain Moderate    Gram Stain WBC present-both PMN and Mononuclear    Gram Stain No Squamous Epithelial Cells Seen    Gram Stain No Organisms Seen    Organism ID, Bacteria SERRATIA MARCESCENS       Susceptibility   Serratia marcescens -  (no method  available)    AMOX/CLAVULANIC  Resistant     CEFAZOLIN >=64 Resistant     CEFTRIAXONE <=1 Sensitive     CEFTAZIDIME <=1 Sensitive     CEFEPIME <=1 Sensitive     GENTAMICIN <=1 Sensitive     TOBRAMYCIN <=1 Sensitive     CIPROFLOXACIN 2 Intermediate     LEVOFLOXACIN 4 Intermediate     TRIMETH/SULFA* <=20 Sensitive      * NR=NOT REPORTABLE,SEE COMMENTORAL therapy:A cefazolin MIC of <32 predicts susceptibility to the oral agents cefaclor,cefdinir,cefpodoxime,cefprozil,cefuroxime,cephalexin,and loracarbef when used for therapy of uncomplicated UTIs due to E.coli,K.pneumomiae,and P.mirabilis. PARENTERAL therapy: A cefazolinMIC of >8 indicates resistance to parenteralcefazolin. An alternate test method must beperformed to confirm susceptibility to parenteralcefazolin.  INR/PT     Status: Abnormal   Collection Time: 02/25/16  3:53 PM  Result Value Ref Range   Prothrombin Time 28.6 (H) 11.4 - 15.2 seconds   INR 2.62   Protime-INR     Status: Abnormal   Collection Time: 03/09/16  6:11 PM  Result Value Ref Range   Prothrombin Time 21.1 (H) 11.4 - 15.2 seconds   INR 1.80   Lactate dehydrogenase     Status: Abnormal   Collection Time: 03/09/16  6:11 PM  Result Value Ref Range   LDH 224 (H) 98 - 192 U/L  MRSA PCR Screening      Status: None   Collection Time: 03/09/16  6:23 PM  Result Value Ref Range   MRSA by PCR NEGATIVE NEGATIVE    Comment:        The GeneXpert MRSA Assay (FDA approved for NASAL specimens only), is one component of a comprehensive MRSA colonization surveillance program. It is not intended to diagnose MRSA infection nor to guide or monitor treatment for MRSA infections.   Heparin level (unfractionated)     Status: None   Collection Time: 03/10/16  4:32 AM  Result Value Ref Range   Heparin Unfractionated 0.48 0.30 - 0.70 IU/mL    Comment:        IF HEPARIN RESULTS ARE BELOW EXPECTED VALUES, AND PATIENT DOSAGE HAS BEEN CONFIRMED, SUGGEST FOLLOW UP TESTING OF ANTITHROMBIN III LEVELS.   Basic metabolic panel     Status: Abnormal   Collection Time: 03/10/16  4:32 AM  Result Value Ref Range   Sodium 138 135 - 145 mmol/L   Potassium 3.6 3.5 - 5.1 mmol/L   Chloride 105 101 - 111 mmol/L   CO2 27 22 - 32 mmol/L   Glucose, Bld 88 65 - 99 mg/dL   BUN 9 6 - 20 mg/dL   Creatinine, Ser 1.00 0.61 - 1.24 mg/dL   Calcium 8.5 (L) 8.9 - 10.3 mg/dL   GFR calc non Af Amer >60 >60 mL/min   GFR calc Af Amer >60 >60 mL/min    Comment: (NOTE) The eGFR has been calculated using the CKD EPI equation. This calculation has not been validated in all clinical situations. eGFR's persistently <60 mL/min signify possible Chronic Kidney Disease.    Anion gap 6 5 - 15  CBC     Status: Abnormal   Collection Time: 03/10/16  4:32 AM  Result Value Ref Range   WBC 6.4 4.0 - 10.5 K/uL   RBC 4.60 4.22 - 5.81 MIL/uL   Hemoglobin 13.1 13.0 - 17.0 g/dL   HCT 39.1 39.0 - 52.0 %   MCV 85.0 78.0 - 100.0 fL   MCH 28.5 26.0 - 34.0 pg   MCHC 33.5 30.0 - 36.0  g/dL   RDW 15.0 11.5 - 15.5 %   Platelets 146 (L) 150 - 400 K/uL  Lactate dehydrogenase     Status: Abnormal   Collection Time: 03/10/16  4:32 AM  Result Value Ref Range   LDH 194 (H) 98 - 192 U/L  Protime-INR     Status: Abnormal   Collection Time:  03/10/16  4:32 AM  Result Value Ref Range   Prothrombin Time 19.6 (H) 11.4 - 15.2 seconds   INR 1.63   Heparin level (unfractionated)     Status: Abnormal   Collection Time: 03/11/16  3:23 AM  Result Value Ref Range   Heparin Unfractionated 0.85 (H) 0.30 - 0.70 IU/mL    Comment:        IF HEPARIN RESULTS ARE BELOW EXPECTED VALUES, AND PATIENT DOSAGE HAS BEEN CONFIRMED, SUGGEST FOLLOW UP TESTING OF ANTITHROMBIN III LEVELS.   CBC     Status: None   Collection Time: 03/11/16  3:23 AM  Result Value Ref Range   WBC 7.9 4.0 - 10.5 K/uL   RBC 5.03 4.22 - 5.81 MIL/uL   Hemoglobin 14.2 13.0 - 17.0 g/dL   HCT 42.6 39.0 - 52.0 %   MCV 84.7 78.0 - 100.0 fL   MCH 28.2 26.0 - 34.0 pg   MCHC 33.3 30.0 - 36.0 g/dL   RDW 15.1 11.5 - 15.5 %   Platelets 180 150 - 400 K/uL  Lactate dehydrogenase     Status: Abnormal   Collection Time: 03/11/16  3:23 AM  Result Value Ref Range   LDH 217 (H) 98 - 192 U/L  Protime-INR     Status: Abnormal   Collection Time: 03/11/16  3:23 AM  Result Value Ref Range   Prothrombin Time 16.3 (H) 11.4 - 15.2 seconds   INR 1.30   Heparin level (unfractionated)     Status: None   Collection Time: 03/11/16 10:56 AM  Result Value Ref Range   Heparin Unfractionated 0.62 0.30 - 0.70 IU/mL    Comment:        IF HEPARIN RESULTS ARE BELOW EXPECTED VALUES, AND PATIENT DOSAGE HAS BEEN CONFIRMED, SUGGEST FOLLOW UP TESTING OF ANTITHROMBIN III LEVELS.   Type and screen [place]     Status: None   Collection Time: 03/11/16  8:58 PM  Result Value Ref Range   ABO/RH(D) O POS    Antibody Screen NEG    Sample Expiration 03/14/2016   Surgical PCR screen     Status: None   Collection Time: 03/11/16 11:40 PM  Result Value Ref Range   MRSA, PCR NEGATIVE NEGATIVE   Staphylococcus aureus NEGATIVE NEGATIVE    Comment:        The Xpert SA Assay (FDA approved for NASAL specimens in patients over 57 years of age), is one component of a comprehensive  surveillance program.  Test performance has been validated by [facility] for patients greater than or equal to 24 year old. It is not intended to diagnose infection nor to guide or monitor treatment.   Lactate dehydrogenase     Status: None   Collection Time: 03/12/16  2:13 AM  Result Value Ref Range   LDH 176 98 - 192 U/L  Protime-INR     Status: Abnormal   Collection Time: 03/12/16  2:13 AM  Result Value Ref Range   Prothrombin Time 15.5 (H) 11.4 - 15.2 seconds   INR 1.22   CBC     Status: None  Collection Time: 03/12/16  2:13 AM  Result Value Ref Range   WBC 7.5 4.0 - 10.5 K/uL   RBC 4.71 4.22 - 5.81 MIL/uL   Hemoglobin 13.4 13.0 - 17.0 g/dL   HCT 40.2 39.0 - 52.0 %   MCV 85.4 78.0 - 100.0 fL   MCH 28.5 26.0 - 34.0 pg   MCHC 33.3 30.0 - 36.0 g/dL   RDW 15.1 11.5 - 15.5 %   Platelets 161 150 - 400 K/uL  APTT     Status: Abnormal   Collection Time: 03/12/16  2:13 AM  Result Value Ref Range   aPTT 140 (H) 24 - 36 seconds    Comment:        IF BASELINE aPTT IS ELEVATED, SUGGEST PATIENT RISK ASSESSMENT BE USED TO DETERMINE APPROPRIATE ANTICOAGULANT THERAPY.   Blood gas, arterial     Status: None   Collection Time: 03/12/16  3:26 AM  Result Value Ref Range   pH, Arterial 7.404 7.350 - 7.450   pCO2 arterial 40.7 32.0 - 48.0 mmHg   pO2, Arterial 84.2 83.0 - 108.0 mmHg   Bicarbonate 24.9 20.0 - 28.0 mmol/L   Acid-Base Excess 0.7 0.0 - 2.0 mmol/L   O2 Saturation 95.8 %   Patient temperature 98.6    Collection site RIGHT RADIAL    Drawn by [phone]    Sample type ARTERIAL DRAW    Allens test (pass/fail) PASS PASS  Urinalysis, Routine w reflex microscopic     Status: None   Collection Time: 03/12/16  5:21 AM  Result Value Ref Range   Color, Urine YELLOW YELLOW   APPearance CLEAR CLEAR   Specific Gravity, Urine 1.011 1.005 - 1.030   pH 6.0 5.0 - 8.0   Glucose, UA NEGATIVE NEGATIVE mg/dL   Hgb urine dipstick NEGATIVE NEGATIVE   Bilirubin Urine NEGATIVE NEGATIVE    Ketones, ur NEGATIVE NEGATIVE mg/dL   Protein, ur NEGATIVE NEGATIVE mg/dL   Nitrite NEGATIVE NEGATIVE   Leukocytes, UA NEGATIVE NEGATIVE  Anaerobic culture     Status: None   Collection Time: 03/12/16  8:18 AM  Result Value Ref Range   Specimen Description WOUND ABDOMEN    Special Requests SPEC A    Culture NO ANAEROBES ISOLATED    Report Status 03/17/2016 FINAL   Aerobic Culture (superficial specimen)     Status: None   Collection Time: 03/12/16  8:18 AM  Result Value Ref Range   Specimen Description WOUND ABDOMEN    Special Requests SPEC A    Gram Stain      ABUNDANT WBC PRESENT, PREDOMINANTLY PMN NO ORGANISMS SEEN    Culture      FEW SERRATIA MARCESCENS CRITICAL RESULT CALLED TO, READ BACK BY AND VERIFIED WITH: RN K.JOHNSON [id number] MLM    Report Status 03/14/2016 FINAL    Organism ID, Bacteria SERRATIA MARCESCENS       Susceptibility   Serratia marcescens - MIC*    CEFAZOLIN >=64 RESISTANT Resistant     CEFEPIME <=1 SENSITIVE Sensitive     CEFTAZIDIME <=1 SENSITIVE Sensitive     CEFTRIAXONE <=1 SENSITIVE Sensitive     CIPROFLOXACIN 2 INTERMEDIATE Intermediate     GENTAMICIN <=1 SENSITIVE Sensitive     TRIMETH/SULFA <=20 SENSITIVE Sensitive     * FEW SERRATIA MARCESCENS  Aerobic/Anaerobic Culture (surgical/deep wound)     Status: None   Collection Time: 03/12/16  8:24 AM  Result Value Ref Range   Specimen Description TISSUE ABDOMEN  Special Requests SPEC B    Gram Stain      FEW WBC PRESENT,BOTH PMN AND MONONUCLEAR NO ORGANISMS SEEN    Culture      FEW SERRATIA MARCESCENS CRITICAL RESULT CALLED TO, READ BACK BY AND VERIFIED WITH: RN K.JOHNSON [id number] MLM NO ANAEROBES ISOLATED    Report Status 03/17/2016 FINAL    Organism ID, Bacteria SERRATIA MARCESCENS       Susceptibility   Serratia marcescens - MIC*    CEFAZOLIN >=64 RESISTANT Resistant     CEFEPIME <=1 SENSITIVE Sensitive     CEFTAZIDIME <=1 SENSITIVE Sensitive     CEFTRIAXONE <=1 SENSITIVE  Sensitive     CIPROFLOXACIN 2 INTERMEDIATE Intermediate     GENTAMICIN <=1 SENSITIVE Sensitive     TRIMETH/SULFA <=20 SENSITIVE Sensitive     * FEW SERRATIA MARCESCENS  Heparin level (unfractionated)     Status: None   Collection Time: 03/12/16  9:34 PM  Result Value Ref Range   Heparin Unfractionated 0.30 0.30 - 0.70 IU/mL    Comment:        IF HEPARIN RESULTS ARE BELOW EXPECTED VALUES, AND PATIENT DOSAGE HAS BEEN CONFIRMED, SUGGEST FOLLOW UP TESTING OF ANTITHROMBIN III LEVELS.   Lactate dehydrogenase     Status: None   Collection Time: 03/13/16  1:35 AM  Result Value Ref Range   LDH 179 98 - 192 U/L  Protime-INR     Status: Abnormal   Collection Time: 03/13/16  1:35 AM  Result Value Ref Range   Prothrombin Time 15.4 (H) 11.4 - 15.2 seconds   INR 1.22   CBC     Status: Abnormal   Collection Time: 03/13/16  1:35 AM  Result Value Ref Range   WBC 8.4 4.0 - 10.5 K/uL   RBC 4.70 4.22 - 5.81 MIL/uL   Hemoglobin 13.3 13.0 - 17.0 g/dL   HCT 40.0 39.0 - 52.0 %   MCV 85.1 78.0 - 100.0 fL   MCH 28.3 26.0 - 34.0 pg   MCHC 33.3 30.0 - 36.0 g/dL   RDW 15.4 11.5 - 15.5 %   Platelets 148 (L) 150 - 400 K/uL  Basic metabolic panel     Status: Abnormal   Collection Time: 03/13/16  1:35 AM  Result Value Ref Range   Sodium 134 (L) 135 - 145 mmol/L   Potassium 3.7 3.5 - 5.1 mmol/L   Chloride 101 101 - 111 mmol/L   CO2 26 22 - 32 mmol/L   Glucose, Bld 112 (H) 65 - 99 mg/dL   BUN 9 6 - 20 mg/dL   Creatinine, Ser 1.13 0.61 - 1.24 mg/dL   Calcium 8.6 (L) 8.9 - 10.3 mg/dL   GFR calc non Af Amer >60 >60 mL/min   GFR calc Af Amer >60 >60 mL/min    Comment: (NOTE) The eGFR has been calculated using the CKD EPI equation. This calculation has not been validated in all clinical situations. eGFR's persistently <60 mL/min signify possible Chronic Kidney Disease.    Anion gap 7 5 - 15  Heparin level (unfractionated)     Status: Abnormal   Collection Time: 03/13/16  8:04 AM  Result Value Ref  Range   Heparin Unfractionated 0.25 (L) 0.30 - 0.70 IU/mL    Comment:        IF HEPARIN RESULTS ARE BELOW EXPECTED VALUES, AND PATIENT DOSAGE HAS BEEN CONFIRMED, SUGGEST FOLLOW UP TESTING OF ANTITHROMBIN III LEVELS.   Heparin level (unfractionated)     Status:  Abnormal   Collection Time: 03/13/16 10:36 PM  Result Value Ref Range   Heparin Unfractionated 0.20 (L) 0.30 - 0.70 IU/mL    Comment:        IF HEPARIN RESULTS ARE BELOW EXPECTED VALUES, AND PATIENT DOSAGE HAS BEEN CONFIRMED, SUGGEST FOLLOW UP TESTING OF ANTITHROMBIN III LEVELS.   Lactate dehydrogenase     Status: None   Collection Time: 03/14/16  2:02 AM  Result Value Ref Range   LDH 192 98 - 192 U/L  Protime-INR     Status: Abnormal   Collection Time: 03/14/16  2:02 AM  Result Value Ref Range   Prothrombin Time 15.4 (H) 11.4 - 15.2 seconds   INR 1.21   CBC     Status: Abnormal   Collection Time: 03/14/16  2:02 AM  Result Value Ref Range   WBC 8.9 4.0 - 10.5 K/uL   RBC 4.63 4.22 - 5.81 MIL/uL   Hemoglobin 13.1 13.0 - 17.0 g/dL   HCT 40.0 39.0 - 52.0 %   MCV 86.4 78.0 - 100.0 fL   MCH 28.3 26.0 - 34.0 pg   MCHC 32.8 30.0 - 36.0 g/dL   RDW 15.4 11.5 - 15.5 %   Platelets 149 (L) 150 - 400 K/uL  Comprehensive metabolic panel     Status: Abnormal   Collection Time: 03/14/16  2:02 AM  Result Value Ref Range   Sodium 135 135 - 145 mmol/L   Potassium 3.9 3.5 - 5.1 mmol/L   Chloride 102 101 - 111 mmol/L   CO2 22 22 - 32 mmol/L   Glucose, Bld 95 65 - 99 mg/dL   BUN 12 6 - 20 mg/dL   Creatinine, Ser 1.21 0.61 - 1.24 mg/dL   Calcium 8.8 (L) 8.9 - 10.3 mg/dL   Total Protein 6.5 6.5 - 8.1 g/dL   Albumin 3.6 3.5 - 5.0 g/dL   AST 15 15 - 41 U/L   ALT 13 (L) 17 - 63 U/L   Alkaline Phosphatase 79 38 - 126 U/L   Total Bilirubin 0.6 0.3 - 1.2 mg/dL   GFR calc non Af Amer >60 >60 mL/min   GFR calc Af Amer >60 >60 mL/min    Comment: (NOTE) The eGFR has been calculated using the CKD EPI equation. This calculation has not  been validated in all clinical situations. eGFR's persistently <60 mL/min signify possible Chronic Kidney Disease.    Anion gap 11 5 - 15  Heparin level (unfractionated)     Status: Abnormal   Collection Time: 03/14/16  2:02 AM  Result Value Ref Range   Heparin Unfractionated 0.17 (L) 0.30 - 0.70 IU/mL    Comment:        IF HEPARIN RESULTS ARE BELOW EXPECTED VALUES, AND PATIENT DOSAGE HAS BEEN CONFIRMED, SUGGEST FOLLOW UP TESTING OF ANTITHROMBIN III LEVELS.   Lactate dehydrogenase     Status: None   Collection Time: 03/15/16  3:53 AM  Result Value Ref Range   LDH 174 98 - 192 U/L  CBC     Status: Abnormal   Collection Time: 03/15/16  3:53 AM  Result Value Ref Range   WBC 7.9 4.0 - 10.5 K/uL   RBC 4.46 4.22 - 5.81 MIL/uL   Hemoglobin 12.8 (L) 13.0 - 17.0 g/dL   HCT 38.5 (L) 39.0 - 52.0 %   MCV 86.3 78.0 - 100.0 fL   MCH 28.7 26.0 - 34.0 pg   MCHC 33.2 30.0 - 36.0 g/dL   RDW 15.6 (  H) 11.5 - 15.5 %   Platelets 145 (L) 150 - 400 K/uL  Basic metabolic panel     Status: Abnormal   Collection Time: 03/15/16  3:53 AM  Result Value Ref Range   Sodium 136 135 - 145 mmol/L   Potassium 4.0 3.5 - 5.1 mmol/L   Chloride 105 101 - 111 mmol/L   CO2 24 22 - 32 mmol/L   Glucose, Bld 91 65 - 99 mg/dL   BUN 10 6 - 20 mg/dL   Creatinine, Ser 1.02 0.61 - 1.24 mg/dL   Calcium 8.8 (L) 8.9 - 10.3 mg/dL   GFR calc non Af Amer >60 >60 mL/min   GFR calc Af Amer >60 >60 mL/min    Comment: (NOTE) The eGFR has been calculated using the CKD EPI equation. This calculation has not been validated in all clinical situations. eGFR's persistently <60 mL/min signify possible Chronic Kidney Disease.    Anion gap 7 5 - 15  Heparin level (unfractionated)     Status: Abnormal   Collection Time: 03/15/16  4:00 AM  Result Value Ref Range   Heparin Unfractionated 0.13 (L) 0.30 - 0.70 IU/mL    Comment:        IF HEPARIN RESULTS ARE BELOW EXPECTED VALUES, AND PATIENT DOSAGE HAS BEEN CONFIRMED, SUGGEST  FOLLOW UP TESTING OF ANTITHROMBIN III LEVELS.   Protime-INR     Status: Abnormal   Collection Time: 03/15/16  4:00 AM  Result Value Ref Range   Prothrombin Time 15.4 (H) 11.4 - 15.2 seconds   INR 1.22   Lactate dehydrogenase     Status: None   Collection Time: 03/16/16  2:09 AM  Result Value Ref Range   LDH 179 98 - 192 U/L  CBC     Status: Abnormal   Collection Time: 03/16/16  2:09 AM  Result Value Ref Range   WBC 7.9 4.0 - 10.5 K/uL   RBC 4.37 4.22 - 5.81 MIL/uL   Hemoglobin 12.4 (L) 13.0 - 17.0 g/dL   HCT 37.8 (L) 39.0 - 52.0 %   MCV 86.5 78.0 - 100.0 fL   MCH 28.4 26.0 - 34.0 pg   MCHC 32.8 30.0 - 36.0 g/dL   RDW 15.8 (H) 11.5 - 15.5 %   Platelets 137 (L) 150 - 400 K/uL  Basic metabolic panel     Status: Abnormal   Collection Time: 03/16/16  2:09 AM  Result Value Ref Range   Sodium 137 135 - 145 mmol/L   Potassium 4.3 3.5 - 5.1 mmol/L   Chloride 105 101 - 111 mmol/L   CO2 25 22 - 32 mmol/L   Glucose, Bld 88 65 - 99 mg/dL   BUN 10 6 - 20 mg/dL   Creatinine, Ser 1.03 0.61 - 1.24 mg/dL   Calcium 8.8 (L) 8.9 - 10.3 mg/dL   GFR calc non Af Amer >60 >60 mL/min   GFR calc Af Amer >60 >60 mL/min    Comment: (NOTE) The eGFR has been calculated using the CKD EPI equation. This calculation has not been validated in all clinical situations. eGFR's persistently <60 mL/min signify possible Chronic Kidney Disease.    Anion gap 7 5 - 15  Heparin level (unfractionated)     Status: Abnormal   Collection Time: 03/16/16  2:09 AM  Result Value Ref Range   Heparin Unfractionated <0.10 (L) 0.30 - 0.70 IU/mL    Comment:        IF HEPARIN RESULTS ARE BELOW EXPECTED VALUES, AND  PATIENT DOSAGE HAS BEEN CONFIRMED, SUGGEST FOLLOW UP TESTING OF ANTITHROMBIN III LEVELS.   Protime-INR     Status: Abnormal   Collection Time: 03/16/16  2:09 AM  Result Value Ref Range   Prothrombin Time 16.2 (H) 11.4 - 15.2 seconds   INR 1.29   Lactate dehydrogenase     Status: None   Collection Time:  03/17/16  4:20 AM  Result Value Ref Range   LDH 168 98 - 192 U/L  Protime-INR     Status: Abnormal   Collection Time: 03/17/16  4:20 AM  Result Value Ref Range   Prothrombin Time 16.9 (H) 11.4 - 15.2 seconds   INR 1.36   CBC     Status: Abnormal   Collection Time: 03/17/16  4:20 AM  Result Value Ref Range   WBC 8.1 4.0 - 10.5 K/uL   RBC 4.55 4.22 - 5.81 MIL/uL   Hemoglobin 12.7 (L) 13.0 - 17.0 g/dL   HCT 39.7 39.0 - 52.0 %   MCV 87.3 78.0 - 100.0 fL   MCH 27.9 26.0 - 34.0 pg   MCHC 32.0 30.0 - 36.0 g/dL   RDW 15.8 (H) 11.5 - 15.5 %   Platelets 138 (L) 150 - 400 K/uL  Basic metabolic panel     Status: None   Collection Time: 03/17/16  4:20 AM  Result Value Ref Range   Sodium 138 135 - 145 mmol/L   Potassium 4.0 3.5 - 5.1 mmol/L   Chloride 105 101 - 111 mmol/L   CO2 25 22 - 32 mmol/L   Glucose, Bld 93 65 - 99 mg/dL   BUN 12 6 - 20 mg/dL   Creatinine, Ser 0.95 0.61 - 1.24 mg/dL   Calcium 8.9 8.9 - 10.3 mg/dL   GFR calc non Af Amer >60 >60 mL/min   GFR calc Af Amer >60 >60 mL/min    Comment: (NOTE) The eGFR has been calculated using the CKD EPI equation. This calculation has not been validated in all clinical situations. eGFR's persistently <60 mL/min signify possible Chronic Kidney Disease.    Anion gap 8 5 - 15  Heparin level (unfractionated)     Status: Abnormal   Collection Time: 03/17/16  4:20 AM  Result Value Ref Range   Heparin Unfractionated 0.17 (L) 0.30 - 0.70 IU/mL    Comment:        IF HEPARIN RESULTS ARE BELOW EXPECTED VALUES, AND PATIENT DOSAGE HAS BEEN CONFIRMED, SUGGEST FOLLOW UP TESTING OF ANTITHROMBIN III LEVELS.   Heparin level (unfractionated)     Status: Abnormal   Collection Time: 03/18/16  3:57 AM  Result Value Ref Range   Heparin Unfractionated 0.22 (L) 0.30 - 0.70 IU/mL    Comment:        IF HEPARIN RESULTS ARE BELOW EXPECTED VALUES, AND PATIENT DOSAGE HAS BEEN CONFIRMED, SUGGEST FOLLOW UP TESTING OF ANTITHROMBIN III LEVELS.   CBC      Status: Abnormal   Collection Time: 03/18/16  3:57 AM  Result Value Ref Range   WBC 7.3 4.0 - 10.5 K/uL   RBC 4.58 4.22 - 5.81 MIL/uL   Hemoglobin 13.1 13.0 - 17.0 g/dL   HCT 39.5 39.0 - 52.0 %   MCV 86.2 78.0 - 100.0 fL   MCH 28.6 26.0 - 34.0 pg   MCHC 33.2 30.0 - 36.0 g/dL   RDW 15.8 (H) 11.5 - 15.5 %   Platelets 135 (L) 150 - 400 K/uL  Lactate dehydrogenase     Status:  None   Collection Time: 03/18/16  3:57 AM  Result Value Ref Range   LDH 171 98 - 192 U/L  Protime-INR     Status: Abnormal   Collection Time: 03/18/16  3:57 AM  Result Value Ref Range   Prothrombin Time 18.0 (H) 11.4 - 15.2 seconds   INR 1.47   Lactate dehydrogenase     Status: None   Collection Time: 03/19/16  5:00 AM  Result Value Ref Range   LDH 179 98 - 192 U/L  Protime-INR     Status: Abnormal   Collection Time: 03/19/16  5:00 AM  Result Value Ref Range   Prothrombin Time 19.8 (H) 11.4 - 15.2 seconds   INR 1.66   CBC     Status: Abnormal   Collection Time: 03/19/16  5:00 AM  Result Value Ref Range   WBC 8.3 4.0 - 10.5 K/uL   RBC 4.71 4.22 - 5.81 MIL/uL   Hemoglobin 13.2 13.0 - 17.0 g/dL   HCT 41.1 39.0 - 52.0 %   MCV 87.3 78.0 - 100.0 fL   MCH 28.0 26.0 - 34.0 pg   MCHC 32.1 30.0 - 36.0 g/dL   RDW 15.9 (H) 11.5 - 15.5 %   Platelets 147 (L) 150 - 400 K/uL  Heparin level (unfractionated)     Status: Abnormal   Collection Time: 03/19/16  5:00 AM  Result Value Ref Range   Heparin Unfractionated 0.22 (L) 0.30 - 0.70 IU/mL    Comment:        IF HEPARIN RESULTS ARE BELOW EXPECTED VALUES, AND PATIENT DOSAGE HAS BEEN CONFIRMED, SUGGEST FOLLOW UP TESTING OF ANTITHROMBIN III LEVELS.   Lactate dehydrogenase     Status: Abnormal   Collection Time: 03/20/16  4:13 AM  Result Value Ref Range   LDH 195 (H) 98 - 192 U/L  Protime-INR     Status: Abnormal   Collection Time: 03/20/16  4:13 AM  Result Value Ref Range   Prothrombin Time 22.0 (H) 11.4 - 15.2 seconds   INR 1.90   CBC     Status: Abnormal    Collection Time: 03/20/16  4:13 AM  Result Value Ref Range   WBC 7.7 4.0 - 10.5 K/uL   RBC 4.82 4.22 - 5.81 MIL/uL   Hemoglobin 13.5 13.0 - 17.0 g/dL   HCT 41.4 39.0 - 52.0 %   MCV 85.9 78.0 - 100.0 fL   MCH 28.0 26.0 - 34.0 pg   MCHC 32.6 30.0 - 36.0 g/dL   RDW 15.7 (H) 11.5 - 15.5 %   Platelets 161 150 - 400 K/uL  Heparin level (unfractionated)     Status: Abnormal   Collection Time: 03/20/16  4:13 AM  Result Value Ref Range   Heparin Unfractionated 0.25 (L) 0.30 - 0.70 IU/mL    Comment:        IF HEPARIN RESULTS ARE BELOW EXPECTED VALUES, AND PATIENT DOSAGE HAS BEEN CONFIRMED, SUGGEST FOLLOW UP TESTING OF ANTITHROMBIN III LEVELS.   Basic metabolic panel     Status: Abnormal   Collection Time: 03/20/16  4:13 AM  Result Value Ref Range   Sodium 136 135 - 145 mmol/L   Potassium 4.2 3.5 - 5.1 mmol/L   Chloride 103 101 - 111 mmol/L   CO2 20 (L) 22 - 32 mmol/L   Glucose, Bld 85 65 - 99 mg/dL   BUN 13 6 - 20 mg/dL   Creatinine, Ser 1.02 0.61 - 1.24 mg/dL   Calcium 9.2 8.9 -  10.3 mg/dL   GFR calc non Af Amer >60 >60 mL/min   GFR calc Af Amer >60 >60 mL/min    Comment: (NOTE) The eGFR has been calculated using the CKD EPI equation. This calculation has not been validated in all clinical situations. eGFR's persistently <60 mL/min signify possible Chronic Kidney Disease.    Anion gap 13 5 - 15  POCT INR     Status: None   Collection Time: 03/23/16 12:00 AM  Result Value Ref Range   INR 1.6   CBC w/Diff/Platelet     Status: None   Collection Time: 03/24/16  1:48 PM  Result Value Ref Range   WBC 7.3 4.0 - 10.5 K/uL   RBC 4.83 4.22 - 5.81 MIL/uL   Hemoglobin 13.7 13.0 - 17.0 g/dL   HCT 41.8 39.0 - 52.0 %   MCV 86.5 78.0 - 100.0 fL   MCH 28.4 26.0 - 34.0 pg   MCHC 32.8 30.0 - 36.0 g/dL   RDW 15.0 11.5 - 15.5 %   Platelets 177 150 - 400 K/uL   Neutrophils Relative % 62 %   Neutro Abs 4.5 1.7 - 7.7 K/uL   Lymphocytes Relative 27 %   Lymphs Abs 2.0 0.7 - 4.0 K/uL    Monocytes Relative 9 %   Monocytes Absolute 0.6 0.1 - 1.0 K/uL   Eosinophils Relative 2 %   Eosinophils Absolute 0.1 0.0 - 0.7 K/uL   Basophils Relative 0 %   Basophils Absolute 0.0 0.0 - 0.1 K/uL  Lactate Dehydrogenase (LDH)     Status: Abnormal   Collection Time: 03/24/16  1:48 PM  Result Value Ref Range   LDH 230 (H) 98 - 192 U/L  INR/PT     Status: Abnormal   Collection Time: 03/24/16  1:48 PM  Result Value Ref Range   Prothrombin Time 19.6 (H) 11.4 - 15.2 seconds   INR 1.63   B Nat Peptide     Status: None   Collection Time: 03/24/16  1:48 PM  Result Value Ref Range   B Natriuretic Peptide 37.6 0.0 - 100.0 pg/mL  Comprehensive Metabolic Panel (CMET)     Status: None   Collection Time: 03/24/16  1:51 PM  Result Value Ref Range   Sodium 139 135 - 145 mmol/L   Potassium 3.9 3.5 - 5.1 mmol/L    Comment: SLIGHT HEMOLYSIS   Chloride 107 101 - 111 mmol/L   CO2 25 22 - 32 mmol/L   Glucose, Bld 96 65 - 99 mg/dL   BUN 10 6 - 20 mg/dL   Creatinine, Ser 1.14 0.61 - 1.24 mg/dL   Calcium 9.1 8.9 - 10.3 mg/dL   Total Protein 7.1 6.5 - 8.1 g/dL   Albumin 4.1 3.5 - 5.0 g/dL   AST 25 15 - 41 U/L   ALT 20 17 - 63 U/L   Alkaline Phosphatase 90 38 - 126 U/L   Total Bilirubin 0.8 0.3 - 1.2 mg/dL   GFR calc non Af Amer >60 >60 mL/min   GFR calc Af Amer >60 >60 mL/min    Comment: (NOTE) The eGFR has been calculated using the CKD EPI equation. This calculation has not been validated in all clinical situations. eGFR's persistently <60 mL/min signify possible Chronic Kidney Disease.    Anion gap 7 5 - 15  Protime-INR     Status: Abnormal   Collection Time: 03/25/16  2:45 AM  Result Value Ref Range   Prothrombin Time 21.2 (H) 11.4 -  15.2 seconds   INR 1.80   CBC     Status: Abnormal   Collection Time: 03/25/16  2:45 AM  Result Value Ref Range   WBC 7.8 4.0 - 10.5 K/uL   RBC 4.43 4.22 - 5.81 MIL/uL   Hemoglobin 12.6 (L) 13.0 - 17.0 g/dL   HCT 38.4 (L) 39.0 - 52.0 %   MCV 86.7 78.0 -  100.0 fL   MCH 28.4 26.0 - 34.0 pg   MCHC 32.8 30.0 - 36.0 g/dL   RDW 15.1 11.5 - 15.5 %   Platelets 173 150 - 400 K/uL  CBC     Status: Abnormal   Collection Time: 03/26/16 11:20 AM  Result Value Ref Range   WBC 7.2 4.0 - 10.5 K/uL   RBC 4.28 4.22 - 5.81 MIL/uL   Hemoglobin 12.3 (L) 13.0 - 17.0 g/dL   HCT 37.0 (L) 39.0 - 52.0 %   MCV 86.4 78.0 - 100.0 fL   MCH 28.7 26.0 - 34.0 pg   MCHC 33.2 30.0 - 36.0 g/dL   RDW 15.2 11.5 - 15.5 %   Platelets 166 150 - 400 K/uL  INR/PT     Status: Abnormal   Collection Time: 03/26/16 11:20 AM  Result Value Ref Range   Prothrombin Time 21.4 (H) 11.4 - 15.2 seconds   INR 1.83   Lactate Dehydrogenase (LDH)     Status: Abnormal   Collection Time: 03/26/16 11:20 AM  Result Value Ref Range   LDH 205 (H) 98 - 192 U/L  Protime-INR     Status: None   Collection Time: 03/30/16  9:23 AM  Result Value Ref Range   Prothrombin Time 13.8 11.4 - 15.2 seconds   INR 1.06   Lactate dehydrogenase     Status: None   Collection Time: 03/30/16  9:23 AM  Result Value Ref Range   LDH 176 98 - 192 U/L  Protime-INR     Status: None   Collection Time: 04/02/16  9:25 AM  Result Value Ref Range   Prothrombin Time 15.2 11.4 - 15.2 seconds   INR 1.19   Lactate dehydrogenase     Status: None   Collection Time: 04/02/16  9:25 AM  Result Value Ref Range   LDH 186 98 - 192 U/L  Lactate dehydrogenase     Status: Abnormal   Collection Time: 04/06/16  9:29 AM  Result Value Ref Range   LDH 225 (H) 98 - 192 U/L  Protime-INR     Status: Abnormal   Collection Time: 04/06/16  9:29 AM  Result Value Ref Range   Prothrombin Time 25.7 (H) 11.4 - 15.2 seconds   INR 2.30   CBC     Status: None   Collection Time: 04/13/16 10:39 AM  Result Value Ref Range   WBC 7.1 4.0 - 10.5 K/uL   RBC 4.56 4.22 - 5.81 MIL/uL   Hemoglobin 13.0 13.0 - 17.0 g/dL   HCT 39.2 39.0 - 52.0 %   MCV 86.0 78.0 - 100.0 fL   MCH 28.5 26.0 - 34.0 pg   MCHC 33.2 30.0 - 36.0 g/dL   RDW 14.6 11.5 -  15.5 %   Platelets 189 150 - 400 K/uL  Lactate Dehydrogenase (LDH)     Status: Abnormal   Collection Time: 04/13/16 10:39 AM  Result Value Ref Range   LDH 225 (H) 98 - 192 U/L  INR/PT     Status: Abnormal   Collection Time: 04/13/16 10:39 AM  Result Value Ref Range   Prothrombin Time 24.2 (H) 11.4 - 15.2 seconds   INR 2.13   Protime-INR     Status: Abnormal   Collection Time: 04/29/16  9:25 AM  Result Value Ref Range   Prothrombin Time 21.1 (H) 11.4 - 15.2 seconds   INR 1.94   Basic metabolic panel     Status: None   Collection Time: 04/29/16  9:25 AM  Result Value Ref Range   Sodium 139 135 - 145 mmol/L   Potassium 3.7 3.5 - 5.1 mmol/L   Chloride 106 101 - 111 mmol/L   CO2 25 22 - 32 mmol/L   Glucose, Bld 90 65 - 99 mg/dL   BUN 9 6 - 20 mg/dL   Creatinine, Ser 1.02 0.61 - 1.24 mg/dL   Calcium 9.1 8.9 - 10.3 mg/dL   GFR calc non Af Amer >60 >60 mL/min   GFR calc Af Amer >60 >60 mL/min    Comment: (NOTE) The eGFR has been calculated using the CKD EPI equation. This calculation has not been validated in all clinical situations. eGFR's persistently <60 mL/min signify possible Chronic Kidney Disease.    Anion gap 8 5 - 15  CBC     Status: Abnormal   Collection Time: 04/29/16  9:25 AM  Result Value Ref Range   WBC 7.0 4.0 - 10.5 K/uL   RBC 4.61 4.22 - 5.81 MIL/uL   Hemoglobin 12.6 (L) 13.0 - 17.0 g/dL   HCT 39.3 39.0 - 52.0 %   MCV 85.2 78.0 - 100.0 fL   MCH 27.3 26.0 - 34.0 pg   MCHC 32.1 30.0 - 36.0 g/dL   RDW 14.9 11.5 - 15.5 %   Platelets 150 150 - 400 K/uL  Lactate dehydrogenase     Status: Abnormal   Collection Time: 04/29/16  9:25 AM  Result Value Ref Range   LDH 232 (H) 98 - 192 U/L  Protime-INR     Status: Abnormal   Collection Time: 05/06/16 10:56 AM  Result Value Ref Range   Prothrombin Time 22.6 (H) 11.4 - 15.2 seconds   INR 1.96   CBC     Status: None   Collection Time: 05/12/16  9:27 AM  Result Value Ref Range   WBC 6.5 4.0 - 10.5 K/uL   RBC 4.86  4.22 - 5.81 MIL/uL   Hemoglobin 13.3 13.0 - 17.0 g/dL   HCT 41.0 39.0 - 52.0 %   MCV 84.4 78.0 - 100.0 fL   MCH 27.4 26.0 - 34.0 pg   MCHC 32.4 30.0 - 36.0 g/dL   RDW 14.6 11.5 - 15.5 %   Platelets 177 150 - 400 K/uL  Protime-INR     Status: Abnormal   Collection Time: 05/12/16  9:27 AM  Result Value Ref Range   Prothrombin Time 25.0 (H) 11.4 - 15.2 seconds   INR 2.22   Lactate dehydrogenase     Status: Abnormal   Collection Time: 05/12/16  9:27 AM  Result Value Ref Range   LDH 226 (H) 98 - 192 U/L  Comprehensive Metabolic Panel (CMET)     Status: Abnormal   Collection Time: 05/12/16  9:27 AM  Result Value Ref Range   Sodium 141 135 - 145 mmol/L   Potassium 3.3 (L) 3.5 - 5.1 mmol/L   Chloride 106 101 - 111 mmol/L   CO2 23 22 - 32 mmol/L   Glucose, Bld 89 65 - 99 mg/dL   BUN 7 6 - 20  mg/dL   Creatinine, Ser 1.01 0.61 - 1.24 mg/dL   Calcium 9.0 8.9 - 10.3 mg/dL   Total Protein 7.0 6.5 - 8.1 g/dL   Albumin 4.0 3.5 - 5.0 g/dL   AST 21 15 - 41 U/L   ALT 17 17 - 63 U/L   Alkaline Phosphatase 88 38 - 126 U/L   Total Bilirubin 0.5 0.3 - 1.2 mg/dL   GFR calc non Af Amer >60 >60 mL/min   GFR calc Af Amer >60 >60 mL/min    Comment: (NOTE) The eGFR has been calculated using the CKD EPI equation. This calculation has not been validated in all clinical situations. eGFR's persistently <60 mL/min signify possible Chronic Kidney Disease.    Anion gap 12 5 - 15  Basic metabolic panel     Status: None   Collection Time: 05/19/16  9:48 AM  Result Value Ref Range   Sodium 138 135 - 145 mmol/L   Potassium 4.1 3.5 - 5.1 mmol/L   Chloride 102 101 - 111 mmol/L   CO2 26 22 - 32 mmol/L   Glucose, Bld 97 65 - 99 mg/dL   BUN 10 6 - 20 mg/dL   Creatinine, Ser 1.17 0.61 - 1.24 mg/dL   Calcium 8.9 8.9 - 10.3 mg/dL   GFR calc non Af Amer >60 >60 mL/min   GFR calc Af Amer >60 >60 mL/min    Comment: (NOTE) The eGFR has been calculated using the CKD EPI equation. This calculation has not been  validated in all clinical situations. eGFR's persistently <60 mL/min signify possible Chronic Kidney Disease.    Anion gap 10 5 - 15  Protime-INR     Status: Abnormal   Collection Time: 05/19/16  9:48 AM  Result Value Ref Range   Prothrombin Time 18.2 (H) 11.4 - 15.2 seconds   INR 1.49   INR/PT     Status: Abnormal   Collection Time: 05/21/16  9:29 AM  Result Value Ref Range   Prothrombin Time 19.2 (H) 11.4 - 15.2 seconds   INR 2.50   Basic metabolic panel     Status: Abnormal   Collection Time: 05/24/16  9:49 AM  Result Value Ref Range   Sodium 138 135 - 145 mmol/L   Potassium 4.2 3.5 - 5.1 mmol/L   Chloride 104 101 - 111 mmol/L   CO2 24 22 - 32 mmol/L   Glucose, Bld 85 65 - 99 mg/dL   BUN 10 6 - 20 mg/dL   Creatinine, Ser 1.01 0.61 - 1.24 mg/dL   Calcium 8.8 (L) 8.9 - 10.3 mg/dL   GFR calc non Af Amer >60 >60 mL/min   GFR calc Af Amer >60 >60 mL/min    Comment: (NOTE) The eGFR has been calculated using the CKD EPI equation. This calculation has not been validated in all clinical situations. eGFR's persistently <60 mL/min signify possible Chronic Kidney Disease.    Anion gap 10 5 - 15  Protime-INR     Status: Abnormal   Collection Time: 05/24/16  9:49 AM  Result Value Ref Range   Prothrombin Time 26.7 (H) 11.4 - 15.2 seconds   INR 2.41   Lactate dehydrogenase     Status: Abnormal   Collection Time: 05/24/16  9:49 AM  Result Value Ref Range   LDH 234 (H) 98 - 192 U/L    ASSESSMENT AND PLAN:   1) Chronic systolic HF, s/p HMII LVAD implant 05/2012.  - Listed 1A at [facility]. Also with visit  to [facility] for dual listing. NYHA II Volume status stable. No diuretics for now.    - He is not on a BB. Intolerant BB.  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   I changed the driveline dressing today using sterile technique. Appears to have hypergranulation on the  medial aspect. Applied silver nitrate. Continue Aquacel Ag. Overall looks better. Plan to change on Thursday.  Continue antibiotics per ID . Continue ceftriaxone 2 grams every 12 hours per ID. Plan for ceftriaxone for another few weeks per Dr Johnnye Sima. PICC site ok. Continue AHC.    3) HTN-  -Stable today. MAP 87. Has modified systolic BP 488. Continue current plan     3) Hyperthyroidism - Amio induced. Follows with Dr. Forde Dandy. Amio stopped. Recent TSH 02/2016 1.422   4) NSVT/VT  - Off amio. On mexilitene. - ICD now at EOL. No plans to replace at this time. Reviewed 04/14/16 continue current plan.  5) PVCs - Resolved.   6) Anticoagulation with coumadin  - Continue coumadin and ASA 325 mg for LVAD. No bleeding problems. Todays INR is 2.41 No Bleeding problems. See anticoagulation flow sheet. 7) LVAD  Vad indices stable.   Follow up on Thursday for dressing change. Continue Aquacel AG  .Dorse Locy NP-C  2:09 PM

## 2016-05-26 ENCOUNTER — Ambulatory Visit (INDEPENDENT_AMBULATORY_CARE_PROVIDER_SITE_OTHER): Payer: Medicaid Other | Admitting: Family Medicine

## 2016-05-26 ENCOUNTER — Encounter: Payer: Self-pay | Admitting: Family Medicine

## 2016-05-26 DIAGNOSIS — M25512 Pain in left shoulder: Secondary | ICD-10-CM | POA: Diagnosis not present

## 2016-05-26 DIAGNOSIS — G8929 Other chronic pain: Secondary | ICD-10-CM

## 2016-05-26 MED ORDER — METHYLPREDNISOLONE ACETATE 40 MG/ML IJ SUSP
40.0000 mg | Freq: Once | INTRAMUSCULAR | Status: AC
  Administered 2016-05-26: 40 mg via INTRA_ARTICULAR

## 2016-05-27 ENCOUNTER — Ambulatory Visit (HOSPITAL_COMMUNITY)
Admission: RE | Admit: 2016-05-27 | Discharge: 2016-05-27 | Disposition: A | Discharge status: Home / Self Care | Payer: Medicaid Other | Source: Ambulatory Visit | Attending: Internal Medicine | Admitting: Internal Medicine

## 2016-05-27 ENCOUNTER — Ambulatory Visit (HOSPITAL_COMMUNITY): Payer: Self-pay | Admitting: Pharmacist

## 2016-05-27 DIAGNOSIS — Z5181 Encounter for therapeutic drug level monitoring: Secondary | ICD-10-CM | POA: Diagnosis present

## 2016-05-27 DIAGNOSIS — Z95811 Presence of heart assist device: Secondary | ICD-10-CM

## 2016-05-27 LAB — PROTIME-INR
INR: 2.3
Prothrombin Time: 25.7 seconds — ABNORMAL HIGH (ref 11.4–15.2)

## 2016-05-27 NOTE — Assessment & Plan Note (Signed)
Appears to have a full thickness supraspinatus tear. The BT also appears to be abnormal. Unsure if he would be a surgical candidate.  - try an injection today  - provided HEP  - f/u in 4 weeks. 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

## 2016-05-27 NOTE — Progress Notes (Signed)
Patient presents to clinic today for drive line exit wound care. Existing VAD dressing removed and site care performed using sterile technique. Drive line exit site cleaned with Chlora prep applicators x 2, allowed to dry, and Sorbaview dressing with bio patch re-applied. Exit site appears to have hypergranulation on the medial aspect, the velour is fully implanted at exit site. No tenderness, , foul odor or rash noted. Redness and small amount of brown darinage noted on dressing. Drive line anchor re-applied. Pt denies fever or chills. Tonye Becket NP made aware.    Return in 1 week for another dressing change per standard of care. INR to be repeated in 1-2 weeks pending results per anticoagulation protocol.   Marcellus Scott RN, VAD Coordinator 24/7 pager [phone]

## 2016-05-27 NOTE — Addendum Note (Signed)
Encounter addended by: Thayer Headings, RN on: 05/27/2016 10:46 AM<BR>    Actions taken: Sign clinical note
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
## 2016-05-31 ENCOUNTER — Ambulatory Visit (HOSPITAL_COMMUNITY)
Admission: RE | Admit: 2016-05-31 | Discharge: 2016-05-31 | Disposition: A | Discharge status: Home / Self Care | Payer: Medicaid Other | Source: Ambulatory Visit | Attending: Internal Medicine | Admitting: Internal Medicine

## 2016-05-31 DIAGNOSIS — Z4801 Encounter for change or removal of surgical wound dressing: Secondary | ICD-10-CM | POA: Diagnosis present

## 2016-05-31 DIAGNOSIS — Z95811 Presence of heart assist device: Secondary | ICD-10-CM | POA: Diagnosis not present

## 2016-05-31 DIAGNOSIS — Z7901 Long term (current) use of anticoagulants: Secondary | ICD-10-CM

## 2016-05-31 NOTE — Progress Notes (Signed)
Patient presents to clinic today for drive line exit wound care. Existing VAD dressing removed and site care performed using sterile technique. Driveline exit site cleaned with Chlora prep applicators x 2 and sterile saline. Gauze dressing with Aquacell Ag re-applied. Exit site appears to be healing well but not yet incorporated; velour is fully implanted at exit site. No tenderness, foul odor or rash noted. Small amount of brown drainage noted to Aquacel strip only. Drive line anchor in good position. (provided another for patient as they fall off). Pt denies fever or chills. Tonye Becket NP in to assess site        Return Thursday for 2x weekly dressing changes. I    Driveline repaired with rescue tape again. No new tearing noted but unraveling of existing tape noted with dressing change.    Rexene Alberts, RN VAD Coordinator   Office: [phone] 24/7 Emergency VAD Pager: [phone]

## 2016-06-02 ENCOUNTER — Encounter (HOSPITAL_COMMUNITY): Payer: Self-pay | Admitting: Infectious Diseases

## 2016-06-03 ENCOUNTER — Ambulatory Visit (HOSPITAL_COMMUNITY)
Admission: RE | Admit: 2016-06-03 | Discharge: 2016-06-03 | Disposition: A | Discharge status: Home / Self Care | Payer: Medicaid Other | Source: Ambulatory Visit | Attending: Internal Medicine | Admitting: Internal Medicine

## 2016-06-03 DIAGNOSIS — Z4800 Encounter for change or removal of nonsurgical wound dressing: Secondary | ICD-10-CM | POA: Diagnosis not present

## 2016-06-03 DIAGNOSIS — Z95811 Presence of heart assist device: Secondary | ICD-10-CM | POA: Insufficient documentation

## 2016-06-03 DIAGNOSIS — T829XXS Unspecified complication of cardiac and vascular prosthetic device, implant and graft, sequela: Secondary | ICD-10-CM

## 2016-06-07 ENCOUNTER — Ambulatory Visit (HOSPITAL_COMMUNITY): Payer: Self-pay | Admitting: Unknown Physician Specialty

## 2016-06-07 ENCOUNTER — Ambulatory Visit (HOSPITAL_COMMUNITY): Payer: Self-pay | Admitting: Pharmacist

## 2016-06-07 ENCOUNTER — Ambulatory Visit (HOSPITAL_COMMUNITY)
Admission: RE | Admit: 2016-06-07 | Discharge: 2016-06-07 | Disposition: A | Discharge status: Home / Self Care | Payer: Medicaid Other | Source: Ambulatory Visit | Attending: Cardiology | Admitting: Cardiology

## 2016-06-07 DIAGNOSIS — Z95811 Presence of heart assist device: Secondary | ICD-10-CM | POA: Diagnosis not present

## 2016-06-07 LAB — PROTIME-INR
INR: 2.31
Prothrombin Time: 25.8 seconds — ABNORMAL HIGH (ref 11.4–15.2)

## 2016-06-07 MED ORDER — ENOXAPARIN SODIUM 100 MG/ML ~~LOC~~ SOLN: 100.0000 mg | Freq: Two times a day (BID) | SUBCUTANEOUS | 1 refills | Status: DC

## 2016-06-07 MED ORDER — WARFARIN SODIUM 5 MG PO TABS: 10.0000 mg | ORAL_TABLET | Freq: Every day | ORAL | 5 refills | Status: DC

## 2016-06-07 NOTE — Progress Notes (Signed)
Patient presents to clinic today for drive line exit wound care. Existing VAD dressing removed and site care performed using sterile technique. Driveline exit site cleaned with Chlora prep applicators x 2 and sterile saline. Gauze dressing with Aquacell Ag re-applied. Exit site appears to be healing well but not yet incorporated; velour is fully implanted at exit site. No tenderness, foul odor or rash noted. Small amount of brown drainage noted to Aquacel strip only. Drive line anchor in good position. Pt denies fever or chills. Tonye Becket NP notified.        Return next week for dressing change.     Carlton Adam, RN VAD Coordinator   Office: [phone] 24/7 Emergency VAD Pager: [phone]

## 2016-06-07 NOTE — Progress Notes (Signed)
Patient presents to clinic today for drive line exit wound care. Existing VAD dressing removed and site care performed using sterile technique. Driveline exit site cleaned with Chlora prep applicators x 2 and sterile saline. Gauze dressing with Aquacell Ag re-applied. Exit site appears to be healing well but not yet incorporated; velour is fully implanted at exit site. No tenderness, foul odor or rash noted. Small amount of brown drainage noted to Aquacel strip only. Drive line anchor in good position. (provided another for patient as they fall off). Pt denies fever or chills. Per Amy continue 2x weekly dressing changes with drainage.    Rexene Alberts, RN VAD Coordinator   Office: [phone] 24/7 Emergency VAD Pager: [phone]

## 2016-06-09 ENCOUNTER — Other Ambulatory Visit (HOSPITAL_COMMUNITY): Payer: Self-pay | Admitting: Pharmacist

## 2016-06-09 ENCOUNTER — Ambulatory Visit (INDEPENDENT_AMBULATORY_CARE_PROVIDER_SITE_OTHER): Payer: Medicaid Other | Admitting: Infectious Diseases

## 2016-06-09 ENCOUNTER — Encounter: Payer: Self-pay | Admitting: Infectious Diseases

## 2016-06-09 DIAGNOSIS — J156 Pneumonia due to other aerobic Gram-negative bacteria: Secondary | ICD-10-CM

## 2016-06-09 DIAGNOSIS — A488 Other specified bacterial diseases: Secondary | ICD-10-CM

## 2016-06-09 MED ORDER — LOVENOX 100 MG/ML ~~LOC~~ SOLN: 100.0000 mg | Freq: Two times a day (BID) | SUBCUTANEOUS | 1 refills | Status: DC

## 2016-06-09 NOTE — Assessment & Plan Note (Addendum)
He is doing well, has completed his IV therapy.  His wound looks good. What he sees as d/c, to me looks like acell.  I have asked him if he would like to come back in 2-3 months or prn- he prefers to come back prn.  I asked him to please let us know if he has fever/chills/wound dc/erythema/tenderness. He agrees to this.  Query if it is possible that we could consider trial of oral 3rd generation cephalosporins-cefdinir, cefpodoxime if he has further issues?  His sensi pattern would suggest this, literature is unclear.  Alternatively, high dose flouriquinolone could be used.   rtc prn.

## 2016-06-09 NOTE — Progress Notes (Signed)
   Subjective:    Patient ID: Robert [name], male    DOB: 07/15/1955, 61 y.o.   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 Robert [name] reports continued exudative drainage from drive line site despite multiple rounds of oral antibiotics with Cipro (08/2015, 09/2015, 11/2015, and 01/2016). He was also hospitalized in August 2017 for IV ceftriaxone followed by Cipro 500mg  BID x 30 days. States that while on antibiotics, his drainage would improve but then would recur after completion of antibiotic course. Never completely resolved. He underwent surgical debridement with VAC placement and wound irrigation with antibiotic solution with Dr. Donata Clay on 03/12/16. Deep cultures from this revealed Serratia marcescens again with resistance to Cefazolin and intermediate resistance to Cipro. He has been on Cefepime since 12/7. His course was complicated by post-op bleeding. He has been getting Acell dressings to his wound and it has been decreasing in size.  His original  anbx stop date was 04-23-16. He was seen in ID on 1-30 and his anbx were extended to a total of 12 weeks.   He has had regular f/u with LVAD clinic and his wound has been noted to be doing well.  Has been feeling well.  PIC out yesterday, IV anbx completed.  No problems with drive site. Still has small amt of d/c. Swelling is smaller.   He is 1a from both [facility] and [facility] for heart transplant.  Has f/u at [facility] 06-11-15          Report Status 03/17/2016 FINAL    Organism ID, Bacteria SERRATIA MARCESCENS   Susceptibility   Serratia marcescens - MIC*    CEFAZOLIN >=64 RESISTANT Resistant     CEFEPIME <=1 SENSITIVE Sensitive     CEFTAZIDIME <=1 SENSITIVE Sensitive     CEFTRIAXONE <=1 SENSITIVE Sensitive     CIPROFLOXACIN 2 INTERMEDIATE  Intermediate     GENTAMICIN <=1 SENSITIVE Sensitive     TRIMETH/SULFA <=20 SENSITIVE Sensitive    * FEW SERRATIA MARCESCENS    Review of Systems  Constitutional: Negative for appetite change, chills, fever and unexpected weight change.  Respiratory: Negative for shortness of breath.   Cardiovascular: Negative for chest pain.  Gastrointestinal: Negative for constipation and diarrhea.  Genitourinary: Negative for difficulty urinating.  Skin: Positive for wound.  feels like he is losing strength the longer he waits for heart txp.  Please see HPI. 12 point ROS o/w (-)      Objective:   Physical Exam  Constitutional: He appears well-developed and well-nourished.  Eyes: EOM are normal. Pupils are equal, round, and reactive to light.  Neck: Neck supple.  Cardiovascular: Normal rate, regular rhythm and normal heart sounds.   Pulmonary/Chest: Effort normal and breath sounds normal.  Abdominal: Soft. Bowel sounds are normal. There is no tenderness. There is no rebound.    Musculoskeletal: He exhibits no edema.  Lymphadenopathy:    He has no cervical adenopathy.      Assessment & Plan:

## 2016-06-14 ENCOUNTER — Ambulatory Visit (HOSPITAL_COMMUNITY): Payer: Self-pay | Admitting: Infectious Diseases

## 2016-06-14 ENCOUNTER — Ambulatory Visit (HOSPITAL_COMMUNITY)
Admission: RE | Admit: 2016-06-14 | Discharge: 2016-06-14 | Disposition: A | Discharge status: Home / Self Care | Payer: Medicaid Other | Source: Ambulatory Visit | Attending: Internal Medicine | Admitting: Internal Medicine

## 2016-06-14 VITALS — BP 123/63 | HR 72 | Ht 74.0 in | Wt 257.0 lb

## 2016-06-14 DIAGNOSIS — I10 Essential (primary) hypertension: Secondary | ICD-10-CM

## 2016-06-14 DIAGNOSIS — A498 Other bacterial infections of unspecified site: Secondary | ICD-10-CM | POA: Diagnosis not present

## 2016-06-14 DIAGNOSIS — T829XXD Unspecified complication of cardiac and vascular prosthetic device, implant and graft, subsequent encounter: Secondary | ICD-10-CM | POA: Diagnosis not present

## 2016-06-14 DIAGNOSIS — Z79899 Other long term (current) drug therapy: Secondary | ICD-10-CM | POA: Insufficient documentation

## 2016-06-14 DIAGNOSIS — Z95810 Presence of automatic (implantable) cardiac defibrillator: Secondary | ICD-10-CM | POA: Insufficient documentation

## 2016-06-14 DIAGNOSIS — Z7901 Long term (current) use of anticoagulants: Secondary | ICD-10-CM | POA: Diagnosis not present

## 2016-06-14 DIAGNOSIS — I5022 Chronic systolic (congestive) heart failure: Secondary | ICD-10-CM | POA: Insufficient documentation

## 2016-06-14 DIAGNOSIS — I110 Hypertensive heart disease with heart failure: Secondary | ICD-10-CM | POA: Insufficient documentation

## 2016-06-14 DIAGNOSIS — Z95811 Presence of heart assist device: Secondary | ICD-10-CM | POA: Diagnosis not present

## 2016-06-14 DIAGNOSIS — I428 Other cardiomyopathies: Secondary | ICD-10-CM | POA: Insufficient documentation

## 2016-06-14 DIAGNOSIS — I272 Pulmonary hypertension, unspecified: Secondary | ICD-10-CM | POA: Diagnosis not present

## 2016-06-14 DIAGNOSIS — Z7982 Long term (current) use of aspirin: Secondary | ICD-10-CM | POA: Insufficient documentation

## 2016-06-14 LAB — COMPREHENSIVE METABOLIC PANEL
ALBUMIN: 3.9 g/dL (ref 3.5–5.0)
ALK PHOS: 82 U/L (ref 38–126)
ALT: 26 U/L (ref 17–63)
AST: 20 U/L (ref 15–41)
Anion gap: 8 (ref 5–15)
BILIRUBIN TOTAL: 0.7 mg/dL (ref 0.3–1.2)
BUN: 10 mg/dL (ref 6–20)
CALCIUM: 8.6 mg/dL — AB (ref 8.9–10.3)
CO2: 24 mmol/L (ref 22–32)
Chloride: 108 mmol/L (ref 101–111)
Creatinine, Ser: 1.03 mg/dL (ref 0.61–1.24)
GFR calc Af Amer: >60 mL/min (ref >60)
GLUCOSE: 91 mg/dL (ref 65–99)
POTASSIUM: 3.7 mmol/L (ref 3.5–5.1)
Sodium: 140 mmol/L (ref 135–145)
Total Protein: 6.4 g/dL — ABNORMAL LOW (ref 6.5–8.1)

## 2016-06-14 LAB — LACTATE DEHYDROGENASE: LDH: 198 U/L — ABNORMAL HIGH (ref 98–192)

## 2016-06-14 LAB — CBC
HEMATOCRIT: 38.4 % — AB (ref 39.0–52.0)
HEMOGLOBIN: 12.1 g/dL — AB (ref 13.0–17.0)
MCH: 26.7 pg (ref 26.0–34.0)
MCHC: 31.5 g/dL (ref 30.0–36.0)
MCV: 84.6 fL (ref 78.0–100.0)
Platelets: 142 10*3/uL — ABNORMAL LOW (ref 150–400)
RBC: 4.54 MIL/uL (ref 4.22–5.81)
RDW: 15.8 % — AB (ref 11.5–15.5)
WBC: 6.5 10*3/uL (ref 4.0–10.5)

## 2016-06-14 LAB — PROTIME-INR
INR: 2.46
Prothrombin Time: 27.2 seconds — ABNORMAL HIGH (ref 11.4–15.2)

## 2016-06-14 NOTE — Patient Instructions (Addendum)
Come back on Thursday 9:30 to do your dressing again  No medication changes today  Back to see your VAD Doctor in 2 months.

## 2016-06-14 NOTE — Progress Notes (Signed)
Patient presents for weekly dressing change and labs  follow up in VAD Clinic today. Reports no problems with VAD equipment or concerns with drive line.  Vital Signs:  Doppler Pressure 104  Automatc BP: 123/63 (91) HR: 72 SPO2: 99 %   Weight: 257.0 lb w/ eqt Last weight: 258.6lb Home weights: 255 lbs   VAD Indication: Bridge to Transplant- Evaluation completed at [facility] with IB listing 05/30/2013; dual listed with [facility] 12/04/2014; 1A time for IV ABX/Drive line infection     VAD interrogation & Equipment Management: Speed: 9400 Flow: 7.0 Power: 6.8w    PI: 6.7  Alarms: many low voltage advisories - counseled about proper battery use Events: 5 - 15  PI events daily  Fixed speed 9400 Low speed limit: 8800  Primary Controller:  Replace back up battery in 16 months. Back up controller:   Replace back up battery in 16 months.  Annual Equipment Maintenance on UBC/PM was performed on 05/06/2016.   I reviewed the LVAD parameters from today and compared the results to the patient's prior recorded data and reviewed with Dr. Gala Romney. LVAD interrogation was NEGATIVE for significant power changes, NEGATIVE for clinical alarms and STABLE for PI events/speed drops. No programming changes were made and pump is functioning within specified parameters. Pt is performing daily controller and system monitor self tests along with completing weekly and monthly maintenance for LVAD equipment.  LVAD equipment check completed and is in good working order. Back-up equipment present. Charged back up battery and performed self-test on equipment.   Exit Site Care: Driveline dressing changed today. Patient reports he changed at home on Saturday d/t it 'falling off'. Drainage with some slight odor. No fevers, chills, N/V, abdominal pain, warmth, erythema. Updated Amy Clegg with pictures - return to clinic Thursday to reassess. Recently d/c'd IV ABX.       Planning on per Amy to resume 2x weekly  dressing changes.   Significant Events on VAD Support:  08/26/15 >>Serratia marcescens (rare) >>Cipro 500 mg bid x 7 days  09/29/15 >> Serratia marcescens (abundant) >> Cipro 500 mg BID x14d 11/20/15 >> Serratia marcescens (few) Admitted IV ceftriaxone >> cipro 500 mg twice daily x 30 days 02/03/16 >> Serratia species >> Cipro 500 mg BID x 14d; referral to ID 02/13/16 03/11/2016 >> driveline debridement, IV antibiotics  Device:Medtronic Dual Therapies: on Last check: 08/05/2014 (assessed Optivol 02/05/16)  BP & Labs:  MAP 91 - 104 - Doppler is reflecting MAP **Has not taken AM Entresto yet  Hgb 12.1. No S/S of bleeding. Specifically denies melena/BRBPR or nosebleeds.  LDH 198 stable at 234 with established baseline of 170- 250. Denies tea-colored urine. No power elevations noted on interrogation.    INTERMACs Patient completed 1370 feet during 6 minute walk test. Tolerated well, conversational throughout and no rest periods.   Neurocognitive trail making completed correctly in 1 m 20 s.   [address] Cardiomyopathy, EQ-5D-3L and post-VAD QOL completed by the patient independently.   Return to clinic Thursday for dressing change. Rexene Alberts, RN VAD Coordinator   Office: [phone] 24/7 Emergency VAD Pager: [phone]

## 2016-06-14 NOTE — Progress Notes (Signed)
LVAD CLINIC NOTE  Patient ID: Robert [name], male   DOB: 01-18-1956, 61 y.o.   MRN: [id number] PCP: N/A HF: [facility]: Dr Forde Dandy   HPI: Mr. 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 status post dual chamber Medtronic ICD implanted April 2011. Cath 2011 showed normal coronaries. He also has a history of VTach .   Amio recently stopped due to thyrotoxicity. Placed on prednisone and tapazole. Had VT off amio and mexilitene started.  Had [place] on 8/17 as part of dual listing process at [facility]. Normal filling pressures and cardiac output.   Drive Line Infeciton  08/26/15 >>Serratia marcescens (rare) >>Cipro 500 mg bid x 7 days  09/29/15 >> Serratia marcescens (abundant) >>Cipro 500 mg BID x14d 11/20/15 >>Serratia marcescens (few) Admitted IV ceftriaxone >>cipro 500 mg twice daily x 30 days 02/03/16 >> Serratia species >> Cipro 500 mg BID x 14d; referral to ID  03/12/2016 Surgical Debridement with VAC placement. Full thickness 5x8x6 cm. 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 Plan to continued on cefepime 1 gram IV every 8 hours for 12 weeks.  03/16/2016 PICC line placed for IV antibiotics.  03/17/2016 VAC reapplied with 1 gram of A Cell applied. 03/25/2016 VAC removed Damp saline dressing applied.  04/02/2016 Acell applied  04/20/2016 Acell applied.  05/06/2016 Aquacel Ag started.   Admitted to [facility] on 12/5 for heparin bridge and surgical debridement of persistent drive line infection. VAC applied with A cell. VAC maintained seal at 75 mm hg continuous therapy. Plan for ceftriaxone for 6 wks until 04/23/16. Will also need VAC changes weekly with ACell. [facility] following for home antibiotics.   Today he returns for LVAD follow up and dressing change.Recently completed IV abx for driveline infection. Wound has healed well but still with mild drainage  from site. No fevers or chills. Seen at [facility] last week and no longer Ia now IB after IV abx complete. Last week he had volume overload so he was instructed to take 40 mg of lasix x1. From HF perspective doing well. Denies SOB/PND/Orthopnea. No BRBPR. Volume status stable.   Recent [facility] on 11/06/15 as part of Transplant process RA = 6 RV =  18/3/6 PA = 19/7 (12) PCW = 8 Fick cardiac output/index = 6.1/2.6 PVR = .0.7 WU Ao sat = 98% PA sat = 70%, 69%   VAD Indication: Bridge to Transplant- Evaluation completed at [facility] with IB listing 05/30/2013; dual listed with [facility] 12/04/2014; 1A time for IV ABX/Drive line infection   VAD interrogation & Equipment Management: Speed: 9400 Flow: 7.0 Power: 6.8w PI: 6.7  Alarms: many low voltage advisories - counseled about proper battery use Events: 5 - 15  PI events daily  Fixed speed 9400 Low speed limit: 8800  Primary Controller: Replace back up battery in 16 months. Back up controller: Replace back up battery in 63month.  I reviewed the LVAD parameters from today, and compared the results to the patient's prior recorded data.  No programming changes were made.  The LVAD is functioning within specified parameters.  The patient performs LVAD self-test daily.  LVAD interrogation was negative for any significant power changes, alarms or PI events/speed drops.  LVAD equipment check completed and is in good working order.  Back-up equipment present.   LVAD education done on emergency procedures and precautions and reviewed exit site care.  Past Medical History:  Diagnosis Date  . AICD (automatic cardioverter/defibrillator) present 2011  . Bronchitis   . Congestive heart failure ([place]) 2011  . Hypertension 2000  . LVAD (left ventricular assist device) present ([place]) 2014  . Pneumonia   . Pulmonary hypertension     Current Outpatient Prescriptions  Medication Sig Dispense Refill  . allopurinol (ZYLOPRIM) 300 MG tablet Take 1 tablet  (300 mg total) by mouth daily. 30 tablet 6  . amLODipine (NORVASC) 5 MG tablet Take 1 tablet (5 mg total) by mouth daily. 14 tablet 0  . aspirin EC 325 MG tablet Take 1 tablet (325 mg total) by mouth daily. 30 tablet 6  . hydrALAZINE (APRESOLINE) 50 MG tablet Take 0.5 tablets (25 mg total) by mouth 2 (two) times daily. 30 tablet 6  . mexiletine (MEXITIL) 150 MG capsule Take 1 capsule (150 mg total) by mouth 2 (two) times daily. 60 capsule 5  . Multiple Vitamin (MULTIVITAMIN) tablet Take 1 tablet by mouth daily.    . pantoprazole (PROTONIX) 40 MG tablet Take 1 tablet (40 mg total) by mouth daily. 30 tablet 3  . sacubitril-valsartan (ENTRESTO) 97-103 MG Take 1 tablet by mouth 2 (two) times daily. 60 tablet 3  . warfarin (COUMADIN) 5 MG tablet Take 2 tablets (10 mg total) by mouth daily. 60 tablet 5  . furosemide (LASIX) 40 MG tablet Take 1 tablet (40 mg total) by mouth daily as needed. Take as directed by clinic 30 tablet 3  . LOVENOX 100 MG/ML injection Inject 1 mL (100 mg total) into the skin every 12 (twelve) hours. (Patient not taking: Reported on 06/09/2016) 10 Syringe 1  . traMADol (ULTRAM) 50 MG tablet Take 1 tablet (50 mg total) by mouth every 8 (eight) hours as needed for moderate pain. (Patient not taking: Reported on 06/14/2016) 30 tablet 5   No current facility-administered medications for this encounter.     Metoprolol and Imdur [isosorbide nitrate]  REVIEW OF SYSTEMS: All systems negative except as listed in HPI, PMH and Problem list.  Vitals:   06/14/16 1121 06/14/16 1122  BP: (!) 104/0 123/63  Pulse: 72    Filed Weights   06/14/16 1121  Weight: 257 lb (116.6 kg)   Vital Signs:  Doppler Pressure 104  Automatc BP: 123/63 (91) HR: 72 SPO2: 99 %   Weight: 257.0 lb w/ eqt Last weight: 258.6lb Home weights: 255 lbs  Physical Exam: GENERAL: NAD, male who presents to clinic. Walked in the clinic without difficulty  HEENT: normal  NECK: Supple, JVP flat;  2+ bilaterally,  no bruits.  No lymphadenopathy or thyromegaly appreciated.  CARDIAC:  Mechanical heart sounds with LVAD hum present.  LUNGS:  CTAB on room air  ABDOMEN:  Soft , NT, ND, + bowel sounds x4.     LVAD exit site: Driveline site well healed but not completely incorporated. Small amount of exudate. No odor. Driveline has multiple twists.  EXTREMITIES:  Warm No cyanosis. No edema.   NEUROLOGIC:  A&O x3. Gait steady.  No aphasia.  No dysarthria.  Affect pleasant.     Recent Results (from the past 2160 hour(s))  Lactate dehydrogenase     Status: None   Collection Time: 03/17/16  4:20 AM  Result Value Ref Range   LDH 168 98 - 192 U/L  Protime-INR     Status: Abnormal   Collection Time: 03/17/16  4:20 AM  Result Value Ref Range   Prothrombin Time 16.9 (H) 11.4 - 15.2 seconds  INR 1.36   CBC     Status: Abnormal   Collection Time: 03/17/16  4:20 AM  Result Value Ref Range   WBC 8.1 4.0 - 10.5 K/uL   RBC 4.55 4.22 - 5.81 MIL/uL   Hemoglobin 12.7 (L) 13.0 - 17.0 g/dL   HCT 39.7 39.0 - 52.0 %   MCV 87.3 78.0 - 100.0 fL   MCH 27.9 26.0 - 34.0 pg   MCHC 32.0 30.0 - 36.0 g/dL   RDW 15.8 (H) 11.5 - 15.5 %   Platelets 138 (L) 150 - 400 K/uL  Basic metabolic panel     Status: None   Collection Time: 03/17/16  4:20 AM  Result Value Ref Range   Sodium 138 135 - 145 mmol/L   Potassium 4.0 3.5 - 5.1 mmol/L   Chloride 105 101 - 111 mmol/L   CO2 25 22 - 32 mmol/L   Glucose, Bld 93 65 - 99 mg/dL   BUN 12 6 - 20 mg/dL   Creatinine, Ser 0.95 0.61 - 1.24 mg/dL   Calcium 8.9 8.9 - 10.3 mg/dL   GFR calc non Af Amer >60 >60 mL/min   GFR calc Af Amer >60 >60 mL/min    Comment: (NOTE) The eGFR has been calculated using the CKD EPI equation. This calculation has not been validated in all clinical situations. eGFR's persistently <60 mL/min signify possible Chronic Kidney Disease.    Anion gap 8 5 - 15  Heparin level (unfractionated)     Status: Abnormal   Collection Time: 03/17/16  4:20 AM  Result  Value Ref Range   Heparin Unfractionated 0.17 (L) 0.30 - 0.70 IU/mL    Comment:        IF HEPARIN RESULTS ARE BELOW EXPECTED VALUES, AND PATIENT DOSAGE HAS BEEN CONFIRMED, SUGGEST FOLLOW UP TESTING OF ANTITHROMBIN III LEVELS.   Heparin level (unfractionated)     Status: Abnormal   Collection Time: 03/18/16  3:57 AM  Result Value Ref Range   Heparin Unfractionated 0.22 (L) 0.30 - 0.70 IU/mL    Comment:        IF HEPARIN RESULTS ARE BELOW EXPECTED VALUES, AND PATIENT DOSAGE HAS BEEN CONFIRMED, SUGGEST FOLLOW UP TESTING OF ANTITHROMBIN III LEVELS.   CBC     Status: Abnormal   Collection Time: 03/18/16  3:57 AM  Result Value Ref Range   WBC 7.3 4.0 - 10.5 K/uL   RBC 4.58 4.22 - 5.81 MIL/uL   Hemoglobin 13.1 13.0 - 17.0 g/dL   HCT 39.5 39.0 - 52.0 %   MCV 86.2 78.0 - 100.0 fL   MCH 28.6 26.0 - 34.0 pg   MCHC 33.2 30.0 - 36.0 g/dL   RDW 15.8 (H) 11.5 - 15.5 %   Platelets 135 (L) 150 - 400 K/uL  Lactate dehydrogenase     Status: None   Collection Time: 03/18/16  3:57 AM  Result Value Ref Range   LDH 171 98 - 192 U/L  Protime-INR     Status: Abnormal   Collection Time: 03/18/16  3:57 AM  Result Value Ref Range   Prothrombin Time 18.0 (H) 11.4 - 15.2 seconds   INR 1.47   Lactate dehydrogenase     Status: None   Collection Time: 03/19/16  5:00 AM  Result Value Ref Range   LDH 179 98 - 192 U/L  Protime-INR     Status: Abnormal   Collection Time: 03/19/16  5:00 AM  Result Value Ref Range   Prothrombin Time 19.8 (  H) 11.4 - 15.2 seconds   INR 1.66   CBC     Status: Abnormal   Collection Time: 03/19/16  5:00 AM  Result Value Ref Range   WBC 8.3 4.0 - 10.5 K/uL   RBC 4.71 4.22 - 5.81 MIL/uL   Hemoglobin 13.2 13.0 - 17.0 g/dL   HCT 41.1 39.0 - 52.0 %   MCV 87.3 78.0 - 100.0 fL   MCH 28.0 26.0 - 34.0 pg   MCHC 32.1 30.0 - 36.0 g/dL   RDW 15.9 (H) 11.5 - 15.5 %   Platelets 147 (L) 150 - 400 K/uL  Heparin level (unfractionated)     Status: Abnormal   Collection Time:  03/19/16  5:00 AM  Result Value Ref Range   Heparin Unfractionated 0.22 (L) 0.30 - 0.70 IU/mL    Comment:        IF HEPARIN RESULTS ARE BELOW EXPECTED VALUES, AND PATIENT DOSAGE HAS BEEN CONFIRMED, SUGGEST FOLLOW UP TESTING OF ANTITHROMBIN III LEVELS.   Lactate dehydrogenase     Status: Abnormal   Collection Time: 03/20/16  4:13 AM  Result Value Ref Range   LDH 195 (H) 98 - 192 U/L  Protime-INR     Status: Abnormal   Collection Time: 03/20/16  4:13 AM  Result Value Ref Range   Prothrombin Time 22.0 (H) 11.4 - 15.2 seconds   INR 1.90   CBC     Status: Abnormal   Collection Time: 03/20/16  4:13 AM  Result Value Ref Range   WBC 7.7 4.0 - 10.5 K/uL   RBC 4.82 4.22 - 5.81 MIL/uL   Hemoglobin 13.5 13.0 - 17.0 g/dL   HCT 41.4 39.0 - 52.0 %   MCV 85.9 78.0 - 100.0 fL   MCH 28.0 26.0 - 34.0 pg   MCHC 32.6 30.0 - 36.0 g/dL   RDW 15.7 (H) 11.5 - 15.5 %   Platelets 161 150 - 400 K/uL  Heparin level (unfractionated)     Status: Abnormal   Collection Time: 03/20/16  4:13 AM  Result Value Ref Range   Heparin Unfractionated 0.25 (L) 0.30 - 0.70 IU/mL    Comment:        IF HEPARIN RESULTS ARE BELOW EXPECTED VALUES, AND PATIENT DOSAGE HAS BEEN CONFIRMED, SUGGEST FOLLOW UP TESTING OF ANTITHROMBIN III LEVELS.   Basic metabolic panel     Status: Abnormal   Collection Time: 03/20/16  4:13 AM  Result Value Ref Range   Sodium 136 135 - 145 mmol/L   Potassium 4.2 3.5 - 5.1 mmol/L   Chloride 103 101 - 111 mmol/L   CO2 20 (L) 22 - 32 mmol/L   Glucose, Bld 85 65 - 99 mg/dL   BUN 13 6 - 20 mg/dL   Creatinine, Ser 1.02 0.61 - 1.24 mg/dL   Calcium 9.2 8.9 - 10.3 mg/dL   GFR calc non Af Amer >60 >60 mL/min   GFR calc Af Amer >60 >60 mL/min    Comment: (NOTE) The eGFR has been calculated using the CKD EPI equation. This calculation has not been validated in all clinical situations. eGFR's persistently <60 mL/min signify possible Chronic Kidney Disease.    Anion gap 13 5 - 15  POCT INR      Status: None   Collection Time: 03/23/16 12:00 AM  Result Value Ref Range   INR 1.6   CBC w/Diff/Platelet     Status: None   Collection Time: 03/24/16  1:48 PM  Result Value Ref Range  WBC 7.3 4.0 - 10.5 K/uL   RBC 4.83 4.22 - 5.81 MIL/uL   Hemoglobin 13.7 13.0 - 17.0 g/dL   HCT 41.8 39.0 - 52.0 %   MCV 86.5 78.0 - 100.0 fL   MCH 28.4 26.0 - 34.0 pg   MCHC 32.8 30.0 - 36.0 g/dL   RDW 15.0 11.5 - 15.5 %   Platelets 177 150 - 400 K/uL   Neutrophils Relative % 62 %   Neutro Abs 4.5 1.7 - 7.7 K/uL   Lymphocytes Relative 27 %   Lymphs Abs 2.0 0.7 - 4.0 K/uL   Monocytes Relative 9 %   Monocytes Absolute 0.6 0.1 - 1.0 K/uL   Eosinophils Relative 2 %   Eosinophils Absolute 0.1 0.0 - 0.7 K/uL   Basophils Relative 0 %   Basophils Absolute 0.0 0.0 - 0.1 K/uL  Lactate Dehydrogenase (LDH)     Status: Abnormal   Collection Time: 03/24/16  1:48 PM  Result Value Ref Range   LDH 230 (H) 98 - 192 U/L  INR/PT     Status: Abnormal   Collection Time: 03/24/16  1:48 PM  Result Value Ref Range   Prothrombin Time 19.6 (H) 11.4 - 15.2 seconds   INR 1.63   B Nat Peptide     Status: None   Collection Time: 03/24/16  1:48 PM  Result Value Ref Range   B Natriuretic Peptide 37.6 0.0 - 100.0 pg/mL  Comprehensive Metabolic Panel (CMET)     Status: None   Collection Time: 03/24/16  1:51 PM  Result Value Ref Range   Sodium 139 135 - 145 mmol/L   Potassium 3.9 3.5 - 5.1 mmol/L    Comment: SLIGHT HEMOLYSIS   Chloride 107 101 - 111 mmol/L   CO2 25 22 - 32 mmol/L   Glucose, Bld 96 65 - 99 mg/dL   BUN 10 6 - 20 mg/dL   Creatinine, Ser 1.14 0.61 - 1.24 mg/dL   Calcium 9.1 8.9 - 10.3 mg/dL   Total Protein 7.1 6.5 - 8.1 g/dL   Albumin 4.1 3.5 - 5.0 g/dL   AST 25 15 - 41 U/L   ALT 20 17 - 63 U/L   Alkaline Phosphatase 90 38 - 126 U/L   Total Bilirubin 0.8 0.3 - 1.2 mg/dL   GFR calc non Af Amer >60 >60 mL/min   GFR calc Af Amer >60 >60 mL/min    Comment: (NOTE) The eGFR has been calculated using  the CKD EPI equation. This calculation has not been validated in all clinical situations. eGFR's persistently <60 mL/min signify possible Chronic Kidney Disease.    Anion gap 7 5 - 15  Protime-INR     Status: Abnormal   Collection Time: 03/25/16  2:45 AM  Result Value Ref Range   Prothrombin Time 21.2 (H) 11.4 - 15.2 seconds   INR 1.80   CBC     Status: Abnormal   Collection Time: 03/25/16  2:45 AM  Result Value Ref Range   WBC 7.8 4.0 - 10.5 K/uL   RBC 4.43 4.22 - 5.81 MIL/uL   Hemoglobin 12.6 (L) 13.0 - 17.0 g/dL   HCT 38.4 (L) 39.0 - 52.0 %   MCV 86.7 78.0 - 100.0 fL   MCH 28.4 26.0 - 34.0 pg   MCHC 32.8 30.0 - 36.0 g/dL   RDW 15.1 11.5 - 15.5 %   Platelets 173 150 - 400 K/uL  CBC     Status: Abnormal   Collection Time:  03/26/16 11:20 AM  Result Value Ref Range   WBC 7.2 4.0 - 10.5 K/uL   RBC 4.28 4.22 - 5.81 MIL/uL   Hemoglobin 12.3 (L) 13.0 - 17.0 g/dL   HCT 37.0 (L) 39.0 - 52.0 %   MCV 86.4 78.0 - 100.0 fL   MCH 28.7 26.0 - 34.0 pg   MCHC 33.2 30.0 - 36.0 g/dL   RDW 15.2 11.5 - 15.5 %   Platelets 166 150 - 400 K/uL  INR/PT     Status: Abnormal   Collection Time: 03/26/16 11:20 AM  Result Value Ref Range   Prothrombin Time 21.4 (H) 11.4 - 15.2 seconds   INR 1.83   Lactate Dehydrogenase (LDH)     Status: Abnormal   Collection Time: 03/26/16 11:20 AM  Result Value Ref Range   LDH 205 (H) 98 - 192 U/L  Protime-INR     Status: None   Collection Time: 03/30/16  9:23 AM  Result Value Ref Range   Prothrombin Time 13.8 11.4 - 15.2 seconds   INR 1.06   Lactate dehydrogenase     Status: None   Collection Time: 03/30/16  9:23 AM  Result Value Ref Range   LDH 176 98 - 192 U/L  Protime-INR     Status: None   Collection Time: 04/02/16  9:25 AM  Result Value Ref Range   Prothrombin Time 15.2 11.4 - 15.2 seconds   INR 1.19   Lactate dehydrogenase     Status: None   Collection Time: 04/02/16  9:25 AM  Result Value Ref Range   LDH 186 98 - 192 U/L  Lactate  dehydrogenase     Status: Abnormal   Collection Time: 04/06/16  9:29 AM  Result Value Ref Range   LDH 225 (H) 98 - 192 U/L  Protime-INR     Status: Abnormal   Collection Time: 04/06/16  9:29 AM  Result Value Ref Range   Prothrombin Time 25.7 (H) 11.4 - 15.2 seconds   INR 2.30   CBC     Status: None   Collection Time: 04/13/16 10:39 AM  Result Value Ref Range   WBC 7.1 4.0 - 10.5 K/uL   RBC 4.56 4.22 - 5.81 MIL/uL   Hemoglobin 13.0 13.0 - 17.0 g/dL   HCT 39.2 39.0 - 52.0 %   MCV 86.0 78.0 - 100.0 fL   MCH 28.5 26.0 - 34.0 pg   MCHC 33.2 30.0 - 36.0 g/dL   RDW 14.6 11.5 - 15.5 %   Platelets 189 150 - 400 K/uL  Lactate Dehydrogenase (LDH)     Status: Abnormal   Collection Time: 04/13/16 10:39 AM  Result Value Ref Range   LDH 225 (H) 98 - 192 U/L  INR/PT     Status: Abnormal   Collection Time: 04/13/16 10:39 AM  Result Value Ref Range   Prothrombin Time 24.2 (H) 11.4 - 15.2 seconds   INR 2.13   Protime-INR     Status: Abnormal   Collection Time: 04/29/16  9:25 AM  Result Value Ref Range   Prothrombin Time 21.1 (H) 11.4 - 15.2 seconds   INR 5.69   Basic metabolic panel     Status: None   Collection Time: 04/29/16  9:25 AM  Result Value Ref Range   Sodium 139 135 - 145 mmol/L   Potassium 3.7 3.5 - 5.1 mmol/L   Chloride 106 101 - 111 mmol/L   CO2 25 22 - 32 mmol/L   Glucose, Bld 90  65 - 99 mg/dL   BUN 9 6 - 20 mg/dL   Creatinine, Ser 1.02 0.61 - 1.24 mg/dL   Calcium 9.1 8.9 - 10.3 mg/dL   GFR calc non Af Amer >60 >60 mL/min   GFR calc Af Amer >60 >60 mL/min    Comment: (NOTE) The eGFR has been calculated using the CKD EPI equation. This calculation has not been validated in all clinical situations. eGFR's persistently <60 mL/min signify possible Chronic Kidney Disease.    Anion gap 8 5 - 15  CBC     Status: Abnormal   Collection Time: 04/29/16  9:25 AM  Result Value Ref Range   WBC 7.0 4.0 - 10.5 K/uL   RBC 4.61 4.22 - 5.81 MIL/uL   Hemoglobin 12.6 (L) 13.0 - 17.0  g/dL   HCT 39.3 39.0 - 52.0 %   MCV 85.2 78.0 - 100.0 fL   MCH 27.3 26.0 - 34.0 pg   MCHC 32.1 30.0 - 36.0 g/dL   RDW 14.9 11.5 - 15.5 %   Platelets 150 150 - 400 K/uL  Lactate dehydrogenase     Status: Abnormal   Collection Time: 04/29/16  9:25 AM  Result Value Ref Range   LDH 232 (H) 98 - 192 U/L  Protime-INR     Status: Abnormal   Collection Time: 05/06/16 10:56 AM  Result Value Ref Range   Prothrombin Time 22.6 (H) 11.4 - 15.2 seconds   INR 1.96   CBC     Status: None   Collection Time: 05/12/16  9:27 AM  Result Value Ref Range   WBC 6.5 4.0 - 10.5 K/uL   RBC 4.86 4.22 - 5.81 MIL/uL   Hemoglobin 13.3 13.0 - 17.0 g/dL   HCT 41.0 39.0 - 52.0 %   MCV 84.4 78.0 - 100.0 fL   MCH 27.4 26.0 - 34.0 pg   MCHC 32.4 30.0 - 36.0 g/dL   RDW 14.6 11.5 - 15.5 %   Platelets 177 150 - 400 K/uL  Protime-INR     Status: Abnormal   Collection Time: 05/12/16  9:27 AM  Result Value Ref Range   Prothrombin Time 25.0 (H) 11.4 - 15.2 seconds   INR 2.22   Lactate dehydrogenase     Status: Abnormal   Collection Time: 05/12/16  9:27 AM  Result Value Ref Range   LDH 226 (H) 98 - 192 U/L  Comprehensive Metabolic Panel (CMET)     Status: Abnormal   Collection Time: 05/12/16  9:27 AM  Result Value Ref Range   Sodium 141 135 - 145 mmol/L   Potassium 3.3 (L) 3.5 - 5.1 mmol/L   Chloride 106 101 - 111 mmol/L   CO2 23 22 - 32 mmol/L   Glucose, Bld 89 65 - 99 mg/dL   BUN 7 6 - 20 mg/dL   Creatinine, Ser 1.01 0.61 - 1.24 mg/dL   Calcium 9.0 8.9 - 10.3 mg/dL   Total Protein 7.0 6.5 - 8.1 g/dL   Albumin 4.0 3.5 - 5.0 g/dL   AST 21 15 - 41 U/L   ALT 17 17 - 63 U/L   Alkaline Phosphatase 88 38 - 126 U/L   Total Bilirubin 0.5 0.3 - 1.2 mg/dL   GFR calc non Af Amer >60 >60 mL/min   GFR calc Af Amer >60 >60 mL/min    Comment: (NOTE) The eGFR has been calculated using the CKD EPI equation. This calculation has not been validated in all clinical situations. eGFR's persistently <  60 mL/min signify  possible Chronic Kidney Disease.    Anion gap 12 5 - 15  Basic metabolic panel     Status: None   Collection Time: 05/19/16  9:48 AM  Result Value Ref Range   Sodium 138 135 - 145 mmol/L   Potassium 4.1 3.5 - 5.1 mmol/L   Chloride 102 101 - 111 mmol/L   CO2 26 22 - 32 mmol/L   Glucose, Bld 97 65 - 99 mg/dL   BUN 10 6 - 20 mg/dL   Creatinine, Ser 1.17 0.61 - 1.24 mg/dL   Calcium 8.9 8.9 - 10.3 mg/dL   GFR calc non Af Amer >60 >60 mL/min   GFR calc Af Amer >60 >60 mL/min    Comment: (NOTE) The eGFR has been calculated using the CKD EPI equation. This calculation has not been validated in all clinical situations. eGFR's persistently <60 mL/min signify possible Chronic Kidney Disease.    Anion gap 10 5 - 15  Protime-INR     Status: Abnormal   Collection Time: 05/19/16  9:48 AM  Result Value Ref Range   Prothrombin Time 18.2 (H) 11.4 - 15.2 seconds   INR 1.49   INR/PT     Status: Abnormal   Collection Time: 05/21/16  9:29 AM  Result Value Ref Range   Prothrombin Time 19.2 (H) 11.4 - 15.2 seconds   INR 6.14   Basic metabolic panel     Status: Abnormal   Collection Time: 05/24/16  9:49 AM  Result Value Ref Range   Sodium 138 135 - 145 mmol/L   Potassium 4.2 3.5 - 5.1 mmol/L   Chloride 104 101 - 111 mmol/L   CO2 24 22 - 32 mmol/L   Glucose, Bld 85 65 - 99 mg/dL   BUN 10 6 - 20 mg/dL   Creatinine, Ser 1.01 0.61 - 1.24 mg/dL   Calcium 8.8 (L) 8.9 - 10.3 mg/dL   GFR calc non Af Amer >60 >60 mL/min   GFR calc Af Amer >60 >60 mL/min    Comment: (NOTE) The eGFR has been calculated using the CKD EPI equation. This calculation has not been validated in all clinical situations. eGFR's persistently <60 mL/min signify possible Chronic Kidney Disease.    Anion gap 10 5 - 15  Protime-INR     Status: Abnormal   Collection Time: 05/24/16  9:49 AM  Result Value Ref Range   Prothrombin Time 26.7 (H) 11.4 - 15.2 seconds   INR 2.41   Lactate dehydrogenase     Status: Abnormal    Collection Time: 05/24/16  9:49 AM  Result Value Ref Range   LDH 234 (H) 98 - 192 U/L  Protime-INR     Status: Abnormal   Collection Time: 05/27/16  9:19 AM  Result Value Ref Range   Prothrombin Time 25.7 (H) 11.4 - 15.2 seconds   INR 2.30   Protime-INR     Status: Abnormal   Collection Time: 06/07/16 10:37 AM  Result Value Ref Range   Prothrombin Time 25.8 (H) 11.4 - 15.2 seconds   INR 2.31   CBC     Status: Abnormal   Collection Time: 06/14/16 11:41 AM  Result Value Ref Range   WBC 6.5 4.0 - 10.5 K/uL   RBC 4.54 4.22 - 5.81 MIL/uL   Hemoglobin 12.1 (L) 13.0 - 17.0 g/dL   HCT 38.4 (L) 39.0 - 52.0 %   MCV 84.6 78.0 - 100.0 fL   MCH 26.7 26.0 - 34.0  pg   MCHC 31.5 30.0 - 36.0 g/dL   RDW 15.8 (H) 11.5 - 15.5 %   Platelets 142 (L) 150 - 400 K/uL  Protime-INR     Status: Abnormal   Collection Time: 06/14/16 11:41 AM  Result Value Ref Range   Prothrombin Time 27.2 (H) 11.4 - 15.2 seconds   INR 2.46   Lactate dehydrogenase     Status: Abnormal   Collection Time: 06/14/16 11:41 AM  Result Value Ref Range   LDH 198 (H) 98 - 192 U/L  Comprehensive metabolic panel     Status: Abnormal   Collection Time: 06/14/16 11:41 AM  Result Value Ref Range   Sodium 140 135 - 145 mmol/L   Potassium 3.7 3.5 - 5.1 mmol/L   Chloride 108 101 - 111 mmol/L   CO2 24 22 - 32 mmol/L   Glucose, Bld 91 65 - 99 mg/dL   BUN 10 6 - 20 mg/dL   Creatinine, Ser 1.03 0.61 - 1.24 mg/dL   Calcium 8.6 (L) 8.9 - 10.3 mg/dL   Total Protein 6.4 (L) 6.5 - 8.1 g/dL   Albumin 3.9 3.5 - 5.0 g/dL   AST 20 15 - 41 U/L   ALT 26 17 - 63 U/L   Alkaline Phosphatase 82 38 - 126 U/L   Total Bilirubin 0.7 0.3 - 1.2 mg/dL   GFR calc non Af Amer >60 >60 mL/min   GFR calc Af Amer >60 >60 mL/min    Comment: (NOTE) The eGFR has been calculated using the CKD EPI equation. This calculation has not been validated in all clinical situations. eGFR's persistently <60 mL/min signify possible Chronic Kidney Disease.    Anion gap  8 5 - 15    ASSESSMENT AND PLAN:   1) Chronic systolic HF, s/p HMII LVAD implant 05/2012.  - Stable NYHA II on VAD support. Volume status looks good.  - Dual listed IB at [facility] and [facility] for dual listing.  - Remains on Entresto. He is not on a BB due to severe fatigue. 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 Has completed course of IV abx with ceftriaxone 2 grams. PICC now out - Driveline site has healed but stll with mild drainage and has not incorporated completely    3) HTN -Elevated today but has not had Entresto this am  AP 87. Has modified systolic BP 462. Continue current plan     3) Hyperthyroidism - Amio induced. Follows with Dr. Forde Dandy. Amio stopped.   4) NSVT/VT  - Off amio. On mexilitene. - ICD now at EOL. No plans to replace at this time.  5) Anticoagulation with coumadin  - Continue coumadin and ASA 325 mg for LVAD. No bleeding problems. Todays INR is 2.46 No Bleeding problems. LDH 198 See anticoagulation flow sheet. 7) LVAD  Vad parameters stable.   Glori Bickers MD 2:42 PM

## 2016-06-17 ENCOUNTER — Ambulatory Visit (HOSPITAL_COMMUNITY)
Admission: RE | Admit: 2016-06-17 | Discharge: 2016-06-17 | Disposition: A | Discharge status: Home / Self Care | Payer: Medicaid Other | Source: Ambulatory Visit | Attending: Cardiology | Admitting: Cardiology

## 2016-06-17 DIAGNOSIS — Z95811 Presence of heart assist device: Secondary | ICD-10-CM | POA: Diagnosis not present

## 2016-06-17 DIAGNOSIS — Z4801 Encounter for change or removal of surgical wound dressing: Secondary | ICD-10-CM | POA: Diagnosis not present

## 2016-06-17 NOTE — Progress Notes (Signed)
Patient presents to clinic today for drive line exit wound care. Existing VAD dressing removed and site care performed using sterile technique. Driveline exit site cleaned with Chlora prep applicators x 2 and sterile saline. Gauze dressing with Aquacell Ag re-applied. Exit site appears to be healing well but not yet incorporated; velour is fully implanted at exit site. No tenderness, foul odor or rash noted. Moderate amount of yellow-brown drainage noted to Aquacel strip and surrounding area. Drive line anchor in good position. Pt denies fever or chills.       Return Monday 3/19 for dressing change. With increased drainage amount we may need to send pt back to ID for consult. We will see what the site looks like on Monday.       Carlton Adam, RN VAD Coordinator   Office: [phone] 24/7 Emergency VAD Pager: [phone]

## 2016-06-21 ENCOUNTER — Ambulatory Visit (HOSPITAL_COMMUNITY)
Admission: RE | Admit: 2016-06-21 | Discharge: 2016-06-21 | Disposition: A | Discharge status: Home / Self Care | Payer: Medicaid Other | Source: Ambulatory Visit | Attending: Cardiology | Admitting: Cardiology

## 2016-06-21 DIAGNOSIS — Z4801 Encounter for change or removal of surgical wound dressing: Secondary | ICD-10-CM | POA: Diagnosis not present

## 2016-06-21 DIAGNOSIS — Z95811 Presence of heart assist device: Secondary | ICD-10-CM | POA: Insufficient documentation

## 2016-06-21 NOTE — Progress Notes (Signed)
Patient presents to clinic today for drive line exit wound care. Existing VAD dressing removed and site care performed using sterile technique. Driveline exit site cleaned with Chlora prep applicators x 2 and sterile saline. Gauze dressing with Aquacell Ag re-applied. Exit site appears to be healing well but not yet incorporated; velour is fully implanted at exit site. No tenderness, foul odor or rash noted. Moderate amount of yellow-brown drainage noted to Aquacel strip and surrounding area. Drive line anchor in good position. Pt denies fever or chills.       Return Thursday 3/22 for dressing change. Pt will need to obtain an appointment with ID for possible start of PO antibiotics per Tonye Becket, NP. Pt verbalized understanding of these instructions and will call and get an appt with Dr. Ninetta Lights.   Carlton Adam, RN VAD Coordinator   Office: [phone] 24/7 Emergency VAD Pager: [phone]

## 2016-06-23 ENCOUNTER — Ambulatory Visit: Payer: Medicaid Other | Admitting: Family Medicine

## 2016-06-24 ENCOUNTER — Ambulatory Visit (HOSPITAL_COMMUNITY)
Admission: RE | Admit: 2016-06-24 | Discharge: 2016-06-24 | Disposition: A | Discharge status: Home / Self Care | Payer: Medicaid Other | Source: Ambulatory Visit | Attending: Internal Medicine | Admitting: Internal Medicine

## 2016-06-24 DIAGNOSIS — Z4801 Encounter for change or removal of surgical wound dressing: Secondary | ICD-10-CM | POA: Diagnosis not present

## 2016-06-24 DIAGNOSIS — Z95811 Presence of heart assist device: Secondary | ICD-10-CM

## 2016-06-24 NOTE — Progress Notes (Signed)
Patient presents to clinic today for drive line exit wound care. Existing VAD dressing removed and site care performed using sterile technique. Driveline exit site cleaned with Chlora prep applicators x 2 and sterile saline. Gauze dressing with Aquacell Ag re-applied. Exit site appears to be healing well but not yet incorporated; velour is fully implanted at exit site. No tenderness, foul odor or rash noted. Moderate amount of brown drainage noted to Aquacel strip and surrounding area. Drive line anchor in good position. Pt denies fever or chills.       Return Monday 3/26 for dressing change. Pt will be seen by ID on Monday as well. Carlton Adam, RN VAD Coordinator   Office: [phone] 24/7 Emergency VAD Pager: [phone]

## 2016-06-28 ENCOUNTER — Ambulatory Visit (HOSPITAL_COMMUNITY): Payer: Self-pay | Admitting: Pharmacist

## 2016-06-28 ENCOUNTER — Ambulatory Visit (INDEPENDENT_AMBULATORY_CARE_PROVIDER_SITE_OTHER): Payer: Medicaid Other | Admitting: Infectious Diseases

## 2016-06-28 ENCOUNTER — Ambulatory Visit (HOSPITAL_COMMUNITY)
Admission: RE | Admit: 2016-06-28 | Discharge: 2016-06-28 | Disposition: A | Discharge status: Home / Self Care | Payer: Medicaid Other | Source: Ambulatory Visit | Attending: Internal Medicine | Admitting: Internal Medicine

## 2016-06-28 ENCOUNTER — Encounter: Payer: Self-pay | Admitting: Infectious Diseases

## 2016-06-28 DIAGNOSIS — J156 Pneumonia due to other aerobic Gram-negative bacteria: Secondary | ICD-10-CM | POA: Diagnosis not present

## 2016-06-28 DIAGNOSIS — I5023 Acute on chronic systolic (congestive) heart failure: Secondary | ICD-10-CM

## 2016-06-28 DIAGNOSIS — T829XXA Unspecified complication of cardiac and vascular prosthetic device, implant and graft, initial encounter: Secondary | ICD-10-CM | POA: Insufficient documentation

## 2016-06-28 DIAGNOSIS — Y838 Other surgical procedures as the cause of abnormal reaction of the patient, or of later complication, without mention of misadventure at the time of the procedure: Secondary | ICD-10-CM | POA: Diagnosis not present

## 2016-06-28 DIAGNOSIS — Z95811 Presence of heart assist device: Secondary | ICD-10-CM | POA: Insufficient documentation

## 2016-06-28 DIAGNOSIS — A488 Other specified bacterial diseases: Secondary | ICD-10-CM

## 2016-06-28 LAB — PROTIME-INR
INR: 3.09
Prothrombin Time: 32.5 seconds — ABNORMAL HIGH (ref 11.4–15.2)

## 2016-06-28 MED ORDER — LEVOFLOXACIN 750 MG PO TABS: 750.0000 mg | ORAL_TABLET | Freq: Every day | ORAL | 1 refills | Status: DC

## 2016-06-28 MED FILL — levoFLOXacin 750 MG TABS: 750 | 14 days supply | Qty: 14 | Fill #0

## 2016-06-28 NOTE — Progress Notes (Signed)
   Subjective:    Patient ID: Robert [name], male    DOB: [birth year], 61 y.o.   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 Robert [name] reports continued exudative drainage from drive line site despite multiple rounds of oral antibiotics with Cipro (08/2015, 09/2015, 11/2015, and 01/2016). He was also hospitalized in August 2017 for IV ceftriaxone followed by Cipro 500mg  BID x 30 days. States that while on antibiotics, his drainage would improve but then would recur after completion of antibiotic course. Never completely resolved. He underwent surgical debridement with VAC placement and wound irrigation with antibiotic solution with Dr. Donata Clay on 03/12/16. Deep cultures from this revealed Serratia marcescens again with resistance to Cefazolin and intermediate resistance to Cipro. He has been on Cefepime since 12/7. His course was complicated bypost-op bleeding. He has been getting Acell dressings to his wound and it has been decreasing in size.  His original anbx stop date was 04-23-16. He was seen in ID on 1-30 and his anbx were extended to a total of 12 weeks.   He was seen in ID for f/u on 3-7 and his IV anbx were completed.  He had f/u with Txp/LVAD team and was felt to have more d/c.  No f/c. No pain at site.   He is 1a from both [facility] for heart transplant.  Has f/u at [facility] 06-11-15          Report Status 03/17/2016 FINAL    Organism ID, Bacteria SERRATIA MARCESCENS   Susceptibility   Serratia marcescens - MIC*    CEFAZOLIN >=64 RESISTANT Resistant     CEFEPIME <=1 SENSITIVE Sensitive     CEFTAZIDIME <=1 SENSITIVE Sensitive     CEFTRIAXONE <=1 SENSITIVE Sensitive     CIPROFLOXACIN 2 INTERMEDIATE Intermediate     GENTAMICIN <=1 SENSITIVE Sensitive      TRIMETH/SULFA <=20 SENSITIVE Sensitive    * FEW SERRATIA MARCESCENS      Review of Systems  Constitutional: Negative for chills and fever.  Respiratory: Negative for cough and shortness of breath.   Cardiovascular: Negative for chest pain and leg swelling.  Gastrointestinal: Negative for abdominal pain, constipation and diarrhea.  Genitourinary: Negative for difficulty urinating.  Please see HPI. 12 point ROS o/w (-)      Objective:   Physical Exam  Constitutional: He appears well-developed and well-nourished.  Cardiovascular:  LVAD humm  Pulmonary/Chest: Effort normal and breath sounds normal.  Abdominal: Soft. Bowel sounds are normal. There is no tenderness. There is no rebound.  Wound is clean, there is minimal d/c at site. Crusting on dressings.   Psychiatric: He has a normal mood and affect.       Assessment & Plan:

## 2016-06-28 NOTE — Progress Notes (Signed)
Patient presents to clinic today for drive line exit wound care. Existing VAD dressing removed and site care performed using sterile technique. Driveline exit site cleaned with Chlora prep applicators x 2 and sterile saline. Gauze dressing with Aquacell Ag re-applied. Exit site appears to be healing well but not yet incorporated; velour is fully implanted at exit site. No tenderness, foul odor or rash noted. Moderate amount of brown drainage noted to Aquacel strip and surrounding area. Drive line anchor in good position. Pt denies fever or chills.    Return in 1 week for another dressing change per standard of care. INR to be repeated in 1-2 weeks pending results per anticoagulation protocol.   Spoke with Dr Ninetta Lights who informed me of plans to start high dose Levaquin. Clinic Pharmacist, Cicero Duck, notified of this medication update.  Marcellus Scott RN, VAD Coordinator 24/7 pager [phone]

## 2016-06-28 NOTE — Assessment & Plan Note (Signed)
Will try him on high dose levaquin.  Will need closer monitoring of his coumadin.  This will be better than bactrim (sensitive but more drug interations).  He is going to LVAD clinic today and will let them know this.  Will see him back in 2 weeks.

## 2016-06-29 ENCOUNTER — Ambulatory Visit (HOSPITAL_COMMUNITY): Payer: Self-pay | Admitting: Pharmacist

## 2016-06-29 ENCOUNTER — Ambulatory Visit (HOSPITAL_COMMUNITY)
Admission: RE | Admit: 2016-06-29 | Discharge: 2016-06-29 | Disposition: A | Discharge status: Home / Self Care | Payer: Medicaid Other | Source: Ambulatory Visit | Attending: Internal Medicine | Admitting: Internal Medicine

## 2016-06-29 ENCOUNTER — Telehealth: Payer: Self-pay | Admitting: Infectious Diseases

## 2016-06-29 DIAGNOSIS — Y838 Other surgical procedures as the cause of abnormal reaction of the patient, or of later complication, without mention of misadventure at the time of the procedure: Secondary | ICD-10-CM | POA: Insufficient documentation

## 2016-06-29 DIAGNOSIS — Z95811 Presence of heart assist device: Secondary | ICD-10-CM | POA: Diagnosis not present

## 2016-06-29 DIAGNOSIS — T829XXA Unspecified complication of cardiac and vascular prosthetic device, implant and graft, initial encounter: Secondary | ICD-10-CM | POA: Diagnosis not present

## 2016-06-29 DIAGNOSIS — T148XXA Other injury of unspecified body region, initial encounter: Secondary | ICD-10-CM

## 2016-06-29 DIAGNOSIS — A488 Other specified bacterial diseases: Secondary | ICD-10-CM

## 2016-06-29 DIAGNOSIS — L089 Local infection of the skin and subcutaneous tissue, unspecified: Secondary | ICD-10-CM

## 2016-06-29 LAB — PROTIME-INR
INR: 2.93
Prothrombin Time: 31.2 seconds — ABNORMAL HIGH (ref 11.4–15.2)

## 2016-06-29 MED ORDER — DEXTROSE 5 % IV SOLN: 2.0000 g | INTRAVENOUS | 0 refills | Status: DC

## 2016-06-29 NOTE — Addendum Note (Signed)
Addended by: Andree Coss on: 06/29/2016 12:16 PM   Modules accepted: Orders

## 2016-06-29 NOTE — Telephone Encounter (Addendum)
PICC order corrected, Patient scheduled for PICC 3/28 at 11:30.   Orders, face sheet, demographics, office note faxed to [facility].  They will give him the first dose at his home tomorrow afternoon. Patient aware, had questions about his INR in relation to the PICC placement.  Please advise.  Andree Coss, RN

## 2016-06-29 NOTE — Telephone Encounter (Signed)
Called pt to change his po levaquin to IV ceftriaxone after discussing with LVAD team.  He agrees to this.  Will work on getting PIC placed, home health.

## 2016-06-30 ENCOUNTER — Encounter (HOSPITAL_COMMUNITY): Payer: Self-pay | Admitting: Unknown Physician Specialty

## 2016-06-30 ENCOUNTER — Other Ambulatory Visit: Payer: Self-pay | Admitting: Infectious Diseases

## 2016-06-30 ENCOUNTER — Ambulatory Visit (HOSPITAL_COMMUNITY)
Admission: RE | Admit: 2016-06-30 | Discharge: 2016-06-30 | Disposition: A | Discharge status: Home / Self Care | Payer: Medicaid Other | Source: Ambulatory Visit | Attending: Infectious Diseases | Admitting: Infectious Diseases

## 2016-06-30 ENCOUNTER — Encounter (HOSPITAL_COMMUNITY): Payer: Self-pay | Admitting: Student

## 2016-06-30 ENCOUNTER — Ambulatory Visit (HOSPITAL_COMMUNITY)
Admission: RE | Admit: 2016-06-30 | Discharge: 2016-06-30 | Disposition: A | Discharge status: Home / Self Care | Payer: Medicaid Other | Source: Ambulatory Visit | Attending: Cardiology | Admitting: Cardiology

## 2016-06-30 DIAGNOSIS — I110 Hypertensive heart disease with heart failure: Secondary | ICD-10-CM | POA: Insufficient documentation

## 2016-06-30 DIAGNOSIS — I878 Other specified disorders of veins: Secondary | ICD-10-CM | POA: Diagnosis not present

## 2016-06-30 DIAGNOSIS — A488 Other specified bacterial diseases: Secondary | ICD-10-CM

## 2016-06-30 DIAGNOSIS — I509 Heart failure, unspecified: Secondary | ICD-10-CM | POA: Diagnosis not present

## 2016-06-30 HISTORY — PX: IR GENERIC HISTORICAL: IMG1180011

## 2016-06-30 MED ORDER — HEPARIN SOD (PORK) LOCK FLUSH 100 UNIT/ML IV SOLN
INTRAVENOUS | Status: AC
  Filled 2016-06-30: qty 5

## 2016-06-30 MED ORDER — LIDOCAINE HCL (PF) 1 % IJ SOLN
INTRAMUSCULAR | Status: AC
  Filled 2016-06-30: qty 30

## 2016-06-30 MED ORDER — LIDOCAINE HCL (PF) 1 % IJ SOLN
INTRAMUSCULAR | Status: DC | PRN
  Administered 2016-06-30: 5 mL

## 2016-06-30 NOTE — Procedures (Signed)
Successful placement of single lumen right PICC line to brachial vein. Length 42cm Tip at lower SVC/RA No complications Ready for use.  Hoyt Koch PA-C 12:19 PM

## 2016-06-30 NOTE — Progress Notes (Signed)
Patient presents to clinic today for drive line exit wound care. Existing VAD dressing removed and site care performed using sterile technique. Driveline exit site cleaned with Chlora prep applicators x 2 and sterile saline. Gauze dressing with Aquacell Ag re-applied. Exit site appears to be healing well but not yet incorporated; velour is fully implanted at exit site. No tenderness, foul odor or rash noted. Moderate amount of yellow drainage noted to Aquacel strip and surrounding area. Drive line anchor in good position. Pt denies fever or chills.    Return Monday 4/2 for another dressing change per standard of care. INR to be repeated in 1-2 weeks pending results per anticoagulation protocol.   Pt had PICC line placed today for IV antibiotics.   Carlton Adam RN, VAD Coordinator 24/7 pager [phone]

## 2016-07-01 ENCOUNTER — Inpatient Hospital Stay (HOSPITAL_COMMUNITY): Admission: RE | Admit: 2016-07-01 | Payer: Medicaid Other | Source: Ambulatory Visit

## 2016-07-01 ENCOUNTER — Encounter (HOSPITAL_COMMUNITY): Payer: Self-pay | Admitting: Unknown Physician Specialty

## 2016-07-04 LAB — CULTURE, BLOOD (SINGLE)
CULTURE: NO GROWTH
CULTURE: NO GROWTH

## 2016-07-05 ENCOUNTER — Ambulatory Visit (HOSPITAL_COMMUNITY)
Admission: RE | Admit: 2016-07-05 | Discharge: 2016-07-05 | Disposition: A | Discharge status: Home / Self Care | Payer: Medicaid Other | Source: Ambulatory Visit | Attending: Internal Medicine | Admitting: Internal Medicine

## 2016-07-05 ENCOUNTER — Ambulatory Visit (HOSPITAL_COMMUNITY): Payer: Self-pay | Admitting: Pharmacist

## 2016-07-05 DIAGNOSIS — Z95811 Presence of heart assist device: Secondary | ICD-10-CM

## 2016-07-05 LAB — PROTIME-INR
INR: 1.49
Prothrombin Time: 18.2 seconds — ABNORMAL HIGH (ref 11.4–15.2)

## 2016-07-08 ENCOUNTER — Ambulatory Visit (HOSPITAL_COMMUNITY)
Admission: RE | Admit: 2016-07-08 | Discharge: 2016-07-08 | Disposition: A | Discharge status: Home / Self Care | Payer: Medicaid Other | Source: Ambulatory Visit | Attending: Internal Medicine | Admitting: Internal Medicine

## 2016-07-08 ENCOUNTER — Ambulatory Visit (HOSPITAL_COMMUNITY): Payer: Self-pay | Admitting: Pharmacist

## 2016-07-08 DIAGNOSIS — Z95811 Presence of heart assist device: Secondary | ICD-10-CM | POA: Diagnosis not present

## 2016-07-08 DIAGNOSIS — Z5181 Encounter for therapeutic drug level monitoring: Secondary | ICD-10-CM | POA: Insufficient documentation

## 2016-07-08 LAB — PROTIME-INR
INR: 1.86
Prothrombin Time: 21.7 seconds — ABNORMAL HIGH (ref 11.4–15.2)

## 2016-07-08 NOTE — Progress Notes (Signed)
Patient presents to clinic today for drive line exit wound care. Existing VAD dressing removed and site care performed using sterile technique. Driveline exit site cleaned with Chlora prep applicators x 2 and sterile saline. Gauze dressing with Aquacell Ag re-applied. Exit site appears to be healing well but not yet incorporated; velour is fully implanted at exit site. No tenderness, foul odor or rash noted. Moderate amount of yellow drainage noted to Aquacel strip and surrounding area. Drive line anchor in good position. Pt denies fever or chills.    Return Monday 4/2 for another dressing change per standard of care. INR to be repeated in 1-2 weeks pending results per anticoagulation protocol.   Marcellus Scott RN, VAD Coordinator 24/7 pager [phone]

## 2016-07-12 ENCOUNTER — Ambulatory Visit (HOSPITAL_COMMUNITY): Payer: Self-pay | Admitting: Pharmacist

## 2016-07-12 ENCOUNTER — Encounter: Payer: Self-pay | Admitting: Infectious Diseases

## 2016-07-12 ENCOUNTER — Ambulatory Visit (INDEPENDENT_AMBULATORY_CARE_PROVIDER_SITE_OTHER): Payer: Medicaid Other | Admitting: Infectious Diseases

## 2016-07-12 ENCOUNTER — Other Ambulatory Visit (HOSPITAL_COMMUNITY): Payer: Self-pay | Admitting: Unknown Physician Specialty

## 2016-07-12 ENCOUNTER — Ambulatory Visit (HOSPITAL_COMMUNITY)
Admission: RE | Admit: 2016-07-12 | Discharge: 2016-07-12 | Disposition: A | Discharge status: Home / Self Care | Payer: Medicaid Other | Source: Ambulatory Visit | Attending: Cardiology | Admitting: Cardiology

## 2016-07-12 DIAGNOSIS — Z95811 Presence of heart assist device: Secondary | ICD-10-CM

## 2016-07-12 DIAGNOSIS — Z7901 Long term (current) use of anticoagulants: Secondary | ICD-10-CM | POA: Diagnosis not present

## 2016-07-12 DIAGNOSIS — J156 Pneumonia due to other aerobic Gram-negative bacteria: Secondary | ICD-10-CM

## 2016-07-12 DIAGNOSIS — Z5181 Encounter for therapeutic drug level monitoring: Secondary | ICD-10-CM | POA: Diagnosis present

## 2016-07-12 DIAGNOSIS — A488 Other specified bacterial diseases: Secondary | ICD-10-CM

## 2016-07-12 LAB — PROTIME-INR
INR: 2.46
Prothrombin Time: 27.1 seconds — ABNORMAL HIGH (ref 11.4–15.2)

## 2016-07-12 NOTE — Addendum Note (Signed)
Encounter addended by: Lebron Quam, RN on: 07/12/2016  4:16 PM<BR>    Actions taken: Sign clinical note

## 2016-07-12 NOTE — Assessment & Plan Note (Signed)
Will continue his ceftriaxone Will see him back in 1 month Hopeful  txp soon.  Will cc CV team.

## 2016-07-12 NOTE — Progress Notes (Signed)
Patient presents to clinic today for drive line exit wound care. Existing VAD dressing removed and site care performed using sterile technique. Driveline exit site cleaned with Chlora prep applicators x 2 and sterile saline. Gauze dressing with Aquacell Ag re-applied. Exit site appears to be healing well but not yet incorporated; velour is fully implanted at exit site. No tenderness, foul odor or rash noted. Moderate amount of brown drainage noted to Aquacel strip and surrounding area. Drive line anchor in good position. Pt denies fever or chills.    Return Thursday 4/12 for another dressing change per standard of care. INR to be repeated in 1-2 weeks pending results per anticoagulation protocol.    Carlton Adam RN, VAD Coordinator 24/7 pager [phone]

## 2016-07-12 NOTE — Progress Notes (Signed)
   Subjective:    Patient ID: Robert [name], male    DOB: 06/03/1955, 61 y.o.   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 Mr. Garges reports continued exudative drainage from drive line site despite multiple rounds of oral antibiotics with Cipro (08/2015, 09/2015, 11/2015, and 01/2016). He was also hospitalized in August 2017 for IV ceftriaxone followed by Cipro 500mg  BID x 30 days. States that while on antibiotics, his drainage would improve but then would recur after completion of antibiotic course. Never completely resolved. He underwent surgical debridement with VAC placement and wound irrigation with antibiotic solution with Dr. Donata Clay on 03/12/16. Deep cultures from this revealed Serratia marcescens again with resistance to Cefazolin and intermediate resistance to Cipro. He has been on Cefepime since 12/7. His course was complicated bypost-op bleeding. He has been getting Acell dressings to his wound and it has been decreasing in size.  His original anbx stop date was 04-23-16. He was seen in ID on 1-30 and his anbx were extended to a total of 12 weeks.  He was seen in ID for f/u on 3-7 and his IV anbx were completed.   He is back to 1a from both [facility] and [facility] for heart transplant.           Report Status 03/17/2016 FINAL    Organism ID, Bacteria SERRATIA MARCESCENS   Susceptibility   Serratia marcescens - MIC*    CEFAZOLIN >=64 RESISTANT Resistant     CEFEPIME <=1 SENSITIVE Sensitive     CEFTAZIDIME <=1 SENSITIVE Sensitive     CEFTRIAXONE <=1 SENSITIVE Sensitive     CIPROFLOXACIN 2 INTERMEDIATE Intermediate     GENTAMICIN <=1 SENSITIVE Sensitive     TRIMETH/SULFA <=20 SENSITIVE Sensitive    * FEW SERRATIA MARCESCENS   He was seen 3-27 for worsening d/c from  his wound.  He was restarted on  levaquin however on discussion with CV we changed to him to IV ceftriaxone.  He has had no problems with his wound. Still has small amt of d/c. No dressing change since last week.  No f/c.    Review of Systems  Constitutional: Negative for chills and fever.  Gastrointestinal: Negative for constipation and diarrhea.  Genitourinary: Negative for difficulty urinating.  Skin: Positive for wound. Negative for rash.   Please see HPI. 12 point ROS o/w (-)     Objective:   Physical Exam  Constitutional: He appears well-developed and well-nourished.  HENT:  Mouth/Throat: No oropharyngeal exudate.  Eyes: EOM are normal. Pupils are equal, round, and reactive to light.  Neck: Neck supple.  Cardiovascular:  LVAD hum  Pulmonary/Chest: Effort normal and breath sounds normal.  Abdominal: Soft. Bowel sounds are normal. There is no tenderness. There is no rebound.  LVAD wound is clean, no d/c.  Crusting on dressing.   Musculoskeletal: He exhibits no edema.  PIC site is clean, no d/c.   Lymphadenopathy:    He has no cervical adenopathy.      Assessment & Plan:

## 2016-07-12 NOTE — Assessment & Plan Note (Signed)
He appears to be doing well.  Hopefully txp soon.

## 2016-07-14 MED FILL — WARFARIN SODIUM 5 MG TABLET: 5 | 30 days supply | Qty: 30 | Fill #2

## 2016-07-15 ENCOUNTER — Ambulatory Visit (HOSPITAL_COMMUNITY)
Admission: RE | Admit: 2016-07-15 | Discharge: 2016-07-15 | Disposition: A | Discharge status: Home / Self Care | Payer: Medicaid Other | Source: Ambulatory Visit | Attending: Cardiology | Admitting: Cardiology

## 2016-07-15 ENCOUNTER — Other Ambulatory Visit (HOSPITAL_COMMUNITY): Payer: Self-pay | Admitting: Unknown Physician Specialty

## 2016-07-15 DIAGNOSIS — Z7901 Long term (current) use of anticoagulants: Secondary | ICD-10-CM

## 2016-07-15 DIAGNOSIS — Z95811 Presence of heart assist device: Secondary | ICD-10-CM | POA: Insufficient documentation

## 2016-07-15 DIAGNOSIS — Z4801 Encounter for change or removal of surgical wound dressing: Secondary | ICD-10-CM | POA: Insufficient documentation

## 2016-07-15 NOTE — Progress Notes (Signed)
Patient presents to clinic today for drive line exit wound care. Existing VAD dressing removed and site care performed using sterile technique. Driveline exit site cleaned with Chlora prep applicators x 2 and sterile saline. Gauze dressing with Aquacell Ag re-applied. Exit site appears to be healing well but not yet incorporated; velour is fully implanted at exit site. No tenderness, foul odor or rash noted. Moderate amount of yellow/brown  drainage noted to Aquacel strip and surrounding area. Drive line anchor in good position. Pt denies fever or chills.    Return Monday 4/16 for another dressing change per standard of care. INR to be repeated in 1-2 weeks pending results per anticoagulation protocol.   Order faxed to home health care for biopatch use on picc line dressing per pt request.  Carlton Adam RN, VAD Coordinator 24/7 pager [phone]

## 2016-07-19 ENCOUNTER — Ambulatory Visit (HOSPITAL_COMMUNITY): Payer: Self-pay | Admitting: Pharmacist

## 2016-07-19 ENCOUNTER — Ambulatory Visit (HOSPITAL_COMMUNITY)
Admission: RE | Admit: 2016-07-19 | Discharge: 2016-07-19 | Disposition: A | Discharge status: Home / Self Care | Payer: Medicaid Other | Source: Ambulatory Visit | Attending: Internal Medicine | Admitting: Internal Medicine

## 2016-07-19 DIAGNOSIS — Z95811 Presence of heart assist device: Secondary | ICD-10-CM

## 2016-07-19 DIAGNOSIS — Z5181 Encounter for therapeutic drug level monitoring: Secondary | ICD-10-CM | POA: Insufficient documentation

## 2016-07-19 DIAGNOSIS — Z7901 Long term (current) use of anticoagulants: Secondary | ICD-10-CM

## 2016-07-19 LAB — PROTIME-INR
INR: 2.43
Prothrombin Time: 26.9 seconds — ABNORMAL HIGH (ref 11.4–15.2)

## 2016-07-19 NOTE — Patient Instructions (Signed)
RTC Thursday as scheduled

## 2016-07-19 NOTE — Progress Notes (Signed)
Patient presents to clinic today for drive line exit wound care. Existing VAD dressing removed and site care performed using sterile technique. Driveline exit site cleaned with Chlora prep applicators x 2 and sterile saline. Gauze dressing with Aquacell Ag re-applied. Exit site appears to be healing well but not yet incorporated; velour is fully implanted at exit site. No tenderness, foul odor or rash noted. Moderate amount of yellow/brown  drainage noted to Aquacel strip and surrounding area. Drive line anchor in good position. Pt denies fever or chills.    Return Thursday 4/19  for another dressing change per standard of care. INR to be repeated in 1-2 weeks pending results per anticoagulation protocol.   Marcellus Scott RN, VAD Coordinator 24/7 pager [phone]

## 2016-07-22 ENCOUNTER — Ambulatory Visit (HOSPITAL_COMMUNITY)
Admission: RE | Admit: 2016-07-22 | Discharge: 2016-07-22 | Disposition: A | Discharge status: Home / Self Care | Payer: Medicaid Other | Source: Ambulatory Visit | Attending: Cardiology | Admitting: Cardiology

## 2016-07-22 DIAGNOSIS — Z4801 Encounter for change or removal of surgical wound dressing: Secondary | ICD-10-CM | POA: Insufficient documentation

## 2016-07-22 DIAGNOSIS — Z95811 Presence of heart assist device: Secondary | ICD-10-CM | POA: Insufficient documentation

## 2016-07-22 NOTE — Progress Notes (Signed)
Patient presents to clinic today for drive line exit wound care. Existing VAD dressing removed and site care performed using sterile technique. Driveline exit site cleaned with Chlora prep applicators x 2 and sterile saline. Gauze dressing with Aquacell Ag re-applied. Exit site appears to be healing well but not yet incorporated; velour is fully implanted at exit site. No tenderness, foul odor or rash noted. Moderate amount of yellow/brown drainage noted to Aquacel strip and surrounding area. Drive line anchor in good position. Pt denies fever or chills.   Return Monday 4/23 for another dressing change per standard of care. INR to be repeated as well  pending results per anticoagulation protocol.   Marcellus Scott RN, VAD Coordinator 24/7 pager [phone]

## 2016-07-25 ENCOUNTER — Other Ambulatory Visit (HOSPITAL_COMMUNITY): Payer: Self-pay | Admitting: Unknown Physician Specialty

## 2016-07-25 DIAGNOSIS — Z7901 Long term (current) use of anticoagulants: Secondary | ICD-10-CM

## 2016-07-25 DIAGNOSIS — Z95811 Presence of heart assist device: Secondary | ICD-10-CM

## 2016-07-26 ENCOUNTER — Other Ambulatory Visit (HOSPITAL_COMMUNITY): Payer: Self-pay | Admitting: Unknown Physician Specialty

## 2016-07-26 ENCOUNTER — Ambulatory Visit (HOSPITAL_COMMUNITY)
Admission: RE | Admit: 2016-07-26 | Discharge: 2016-07-26 | Disposition: A | Discharge status: Home / Self Care | Payer: Medicaid Other | Source: Ambulatory Visit | Attending: Internal Medicine | Admitting: Internal Medicine

## 2016-07-26 DIAGNOSIS — Z95811 Presence of heart assist device: Secondary | ICD-10-CM

## 2016-07-26 DIAGNOSIS — Z4801 Encounter for change or removal of surgical wound dressing: Secondary | ICD-10-CM | POA: Diagnosis not present

## 2016-07-26 DIAGNOSIS — Z7901 Long term (current) use of anticoagulants: Secondary | ICD-10-CM

## 2016-07-26 LAB — PROTIME-INR
INR: 3.71
Prothrombin Time: 37.7 seconds — ABNORMAL HIGH (ref 11.4–15.2)

## 2016-07-26 NOTE — Progress Notes (Signed)
Patient presents to clinic today for drive line exit wound care. Existing VAD dressing removed and site care performed using sterile technique. Driveline exit site cleaned with Chlora prep applicators x 2 and sterile saline. Gauze dressing with Aquacell Ag re-applied. Exit site appears to be healing well but not yet incorporated; velour is fully implanted at exit site. No tenderness, foul odor or rash noted. Moderate amount of yellow/brown drainage noted to Aquacel strip and surrounding area. Drive line anchor in good position. Pt denies fever or chills.    Return Thursday 4/26 for another dressing change per standard of care. INR to be repeated in 1-2 weeks pending results per anticoagulation protocol.     Carlton Adam RN, VAD Coordinator 24/7 pager [phone]

## 2016-07-29 ENCOUNTER — Ambulatory Visit (HOSPITAL_COMMUNITY): Payer: Self-pay | Admitting: Pharmacist

## 2016-07-29 ENCOUNTER — Other Ambulatory Visit (HOSPITAL_COMMUNITY): Payer: Self-pay | Admitting: Internal Medicine

## 2016-07-29 ENCOUNTER — Ambulatory Visit (HOSPITAL_COMMUNITY)
Admission: RE | Admit: 2016-07-29 | Discharge: 2016-07-29 | Disposition: A | Discharge status: Home / Self Care | Payer: Medicaid Other | Source: Ambulatory Visit | Attending: Cardiology | Admitting: Cardiology

## 2016-07-29 DIAGNOSIS — Z7901 Long term (current) use of anticoagulants: Secondary | ICD-10-CM

## 2016-07-29 DIAGNOSIS — Z95811 Presence of heart assist device: Secondary | ICD-10-CM | POA: Diagnosis present

## 2016-07-29 LAB — PROTIME-INR
INR: 3.17
Prothrombin Time: 33.3 seconds — ABNORMAL HIGH (ref 11.4–15.2)

## 2016-07-30 ENCOUNTER — Other Ambulatory Visit (HOSPITAL_COMMUNITY): Payer: Self-pay | Admitting: Internal Medicine

## 2016-08-01 DIAGNOSIS — Z941 Heart transplant status: Secondary | ICD-10-CM

## 2016-08-01 HISTORY — DX: Heart transplant status: Z94.1

## 2016-08-02 ENCOUNTER — Inpatient Hospital Stay (HOSPITAL_COMMUNITY): Admission: RE | Admit: 2016-08-02 | Payer: Medicaid Other | Source: Ambulatory Visit

## 2016-08-05 ENCOUNTER — Other Ambulatory Visit (HOSPITAL_COMMUNITY): Payer: Medicaid Other

## 2016-08-12 ENCOUNTER — Encounter (HOSPITAL_COMMUNITY): Payer: Medicaid Other

## 2016-08-16 ENCOUNTER — Telehealth: Payer: Self-pay | Admitting: Student

## 2016-08-16 ENCOUNTER — Encounter: Payer: Self-pay | Admitting: Infectious Diseases

## 2016-08-16 NOTE — Telephone Encounter (Signed)
Pt is calling because he needs a referral to see the cardiologist at [facility]. He just had a heart transplant and they are doing a follow up tomorrow. He would like to speak to one of the nurse so that he can give you the information that is needed for the referral. jw

## 2016-08-16 NOTE — Telephone Encounter (Signed)
Spoke to pt. He needed to know who his PCP is. He said that is all he needed for the Cardiologist at [facility]. Sunday Spillers, CMA

## 2016-09-08 ENCOUNTER — Ambulatory Visit: Payer: Medicaid Other | Admitting: Infectious Diseases

## 2016-09-27 IMAGING — DX DG CHEST 2V
2 series · 2 of 2 positions shown · non-contrast
Comparison: 06/16/2012.

CLINICAL DATA: Evaluate LVAD positioning

EXAM:
CHEST  2 VIEW

[chest pa]
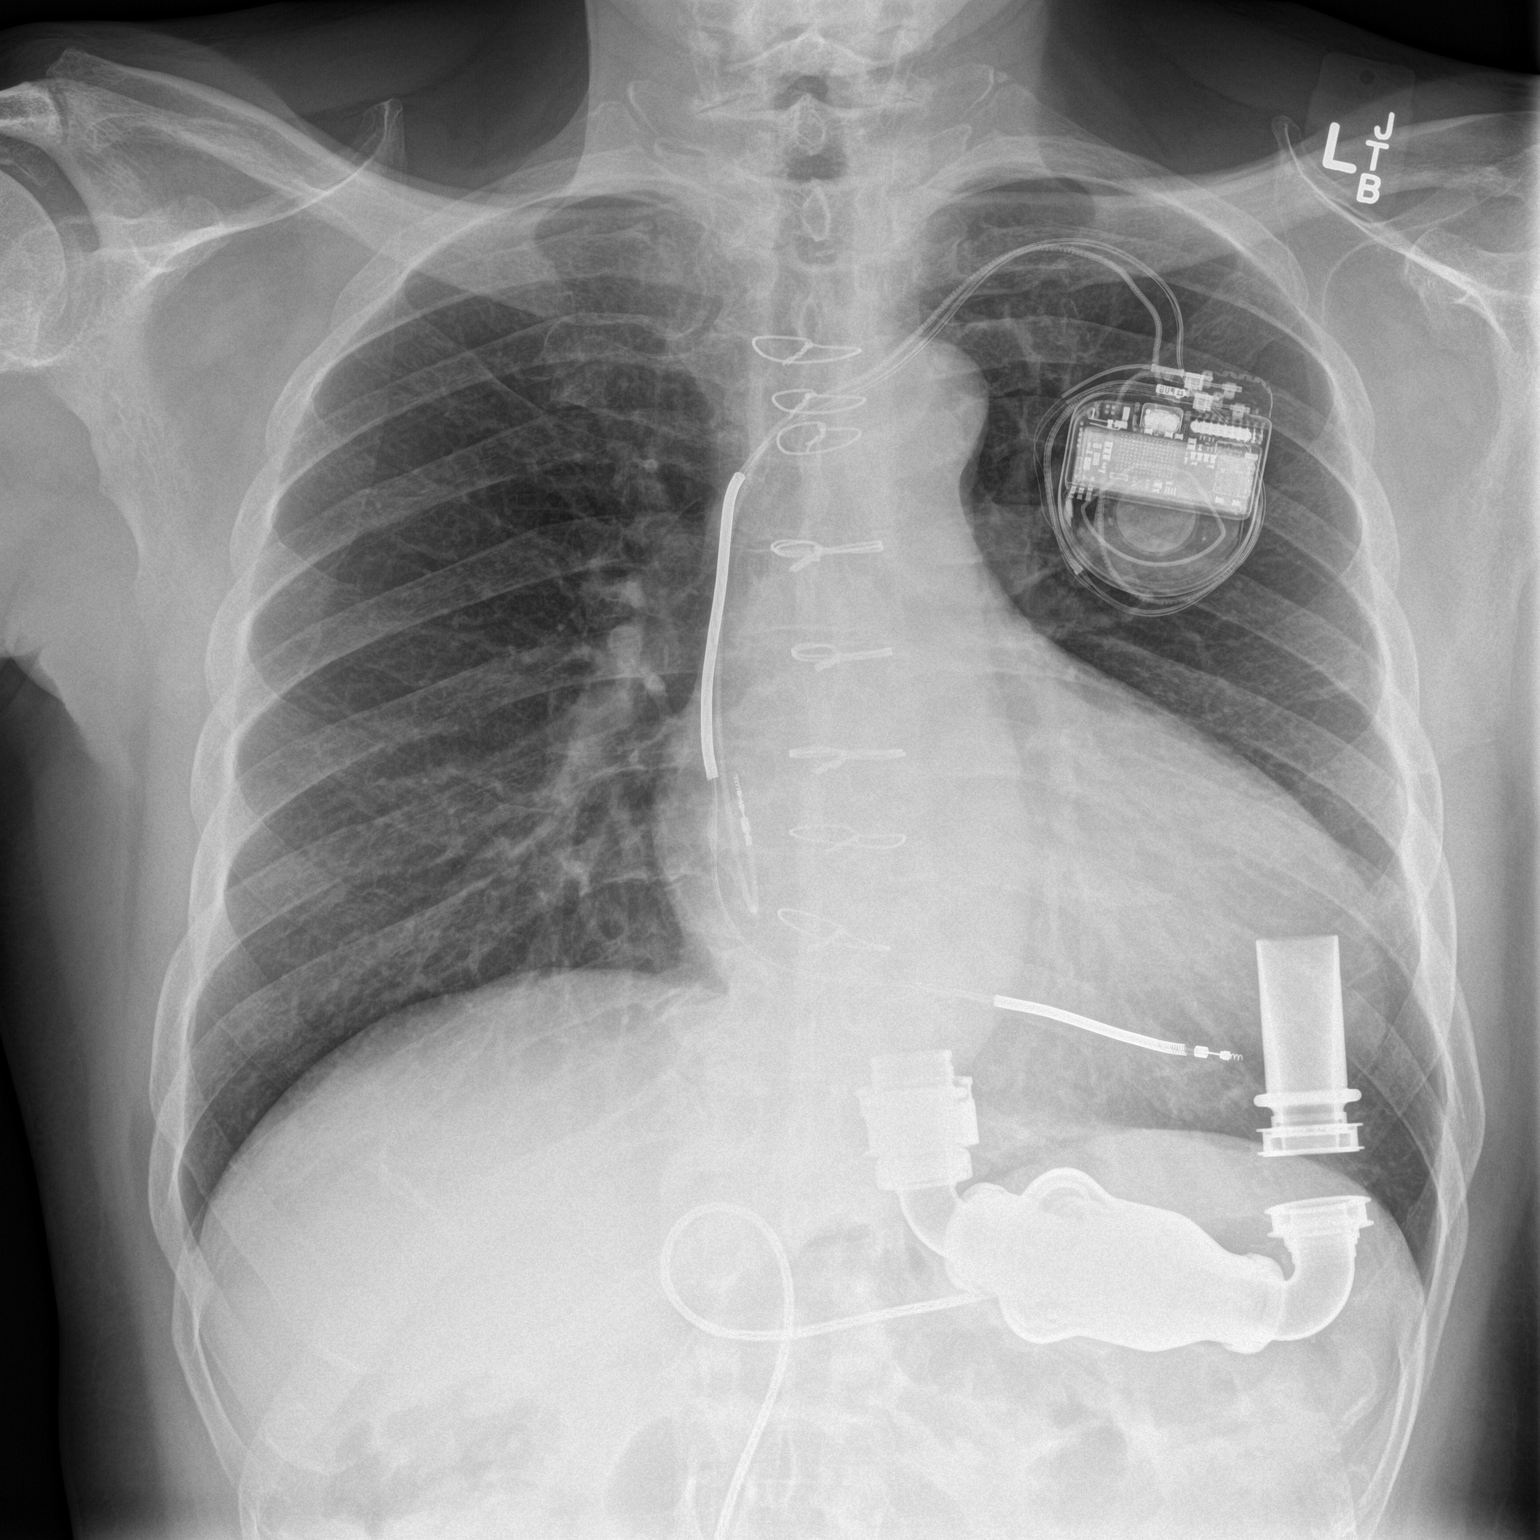

[chest lat]
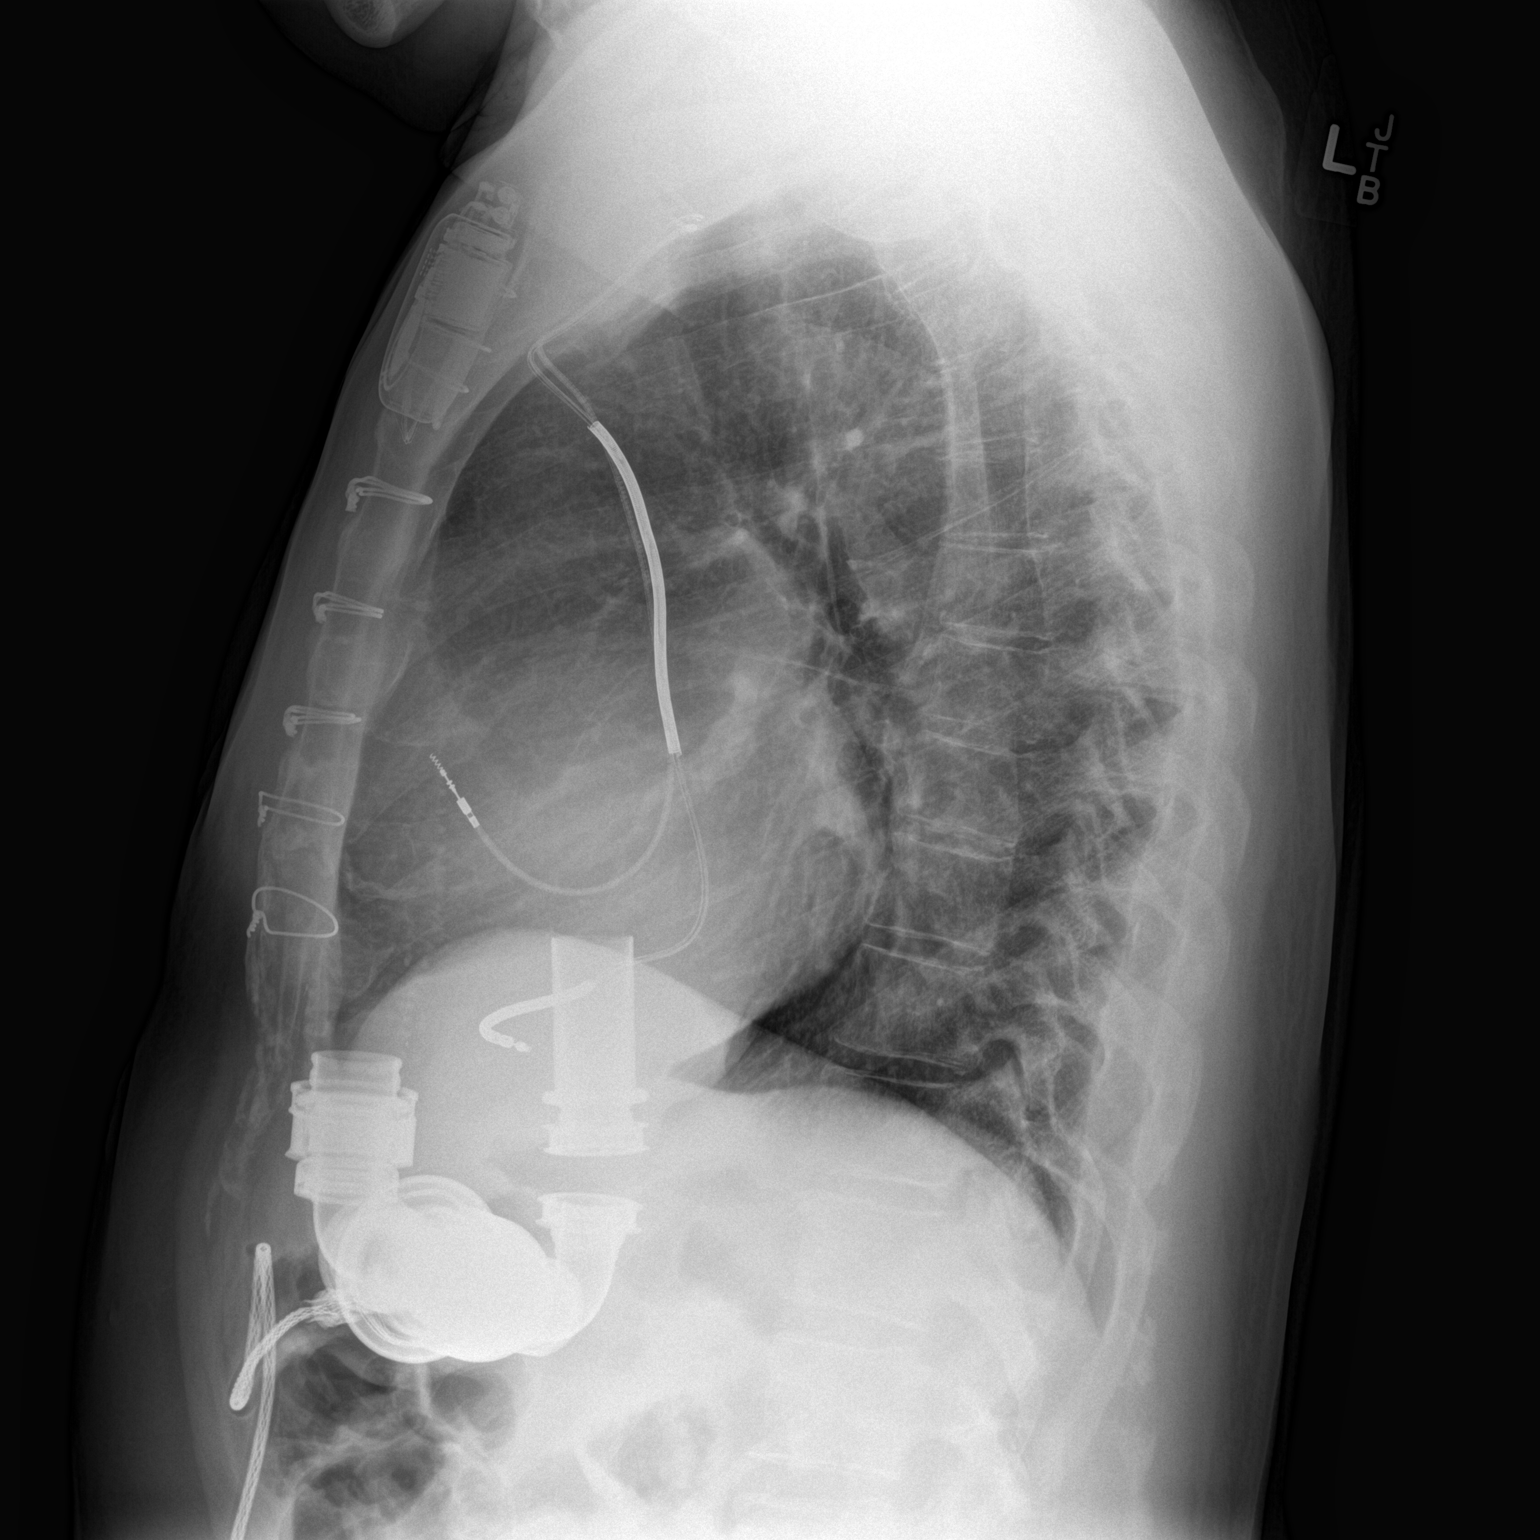

[2 of 2 positions shown; findings below may reference images not displayed]

FINDINGS: There is a left chest wall ICD with lead in the right atrial
appendage and right ventricle. Left ventricular assist device is
identified and appears unchanged in position from the previous exam.
The heart size appears normal. There is no pleural effusion or
edema. No airspace consolidation.
IMPRESSION: Stable positioning of LVAD device.  No heart failure.

## 2016-10-19 MED FILL — PANTOPRAZOLE SOD DR 40 MG T: 40 | 30 days supply | Qty: 30 | Fill #1

## 2016-10-20 MED FILL — VALACYCLOVIR HCL 500 MG TAB: 500 | 30 days supply | Qty: 60 | Fill #0

## 2016-10-28 ENCOUNTER — Encounter: Payer: Self-pay | Admitting: Student

## 2016-10-28 DIAGNOSIS — Z941 Heart transplant status: Secondary | ICD-10-CM | POA: Insufficient documentation

## 2016-12-01 MED FILL — predniSONE 5 MG TABS: 5 | 30 days supply | Qty: 45 | Fill #0

## 2016-12-10 ENCOUNTER — Other Ambulatory Visit: Payer: Self-pay

## 2017-04-07 IMAGING — DX DG CHEST 2V
2 series · 2 of 2 positions shown · non-contrast
Comparison: 03/15/2014 .

CLINICAL DATA: Left ventricular assist device.

EXAM:
CHEST  2 VIEW

[chest pa]
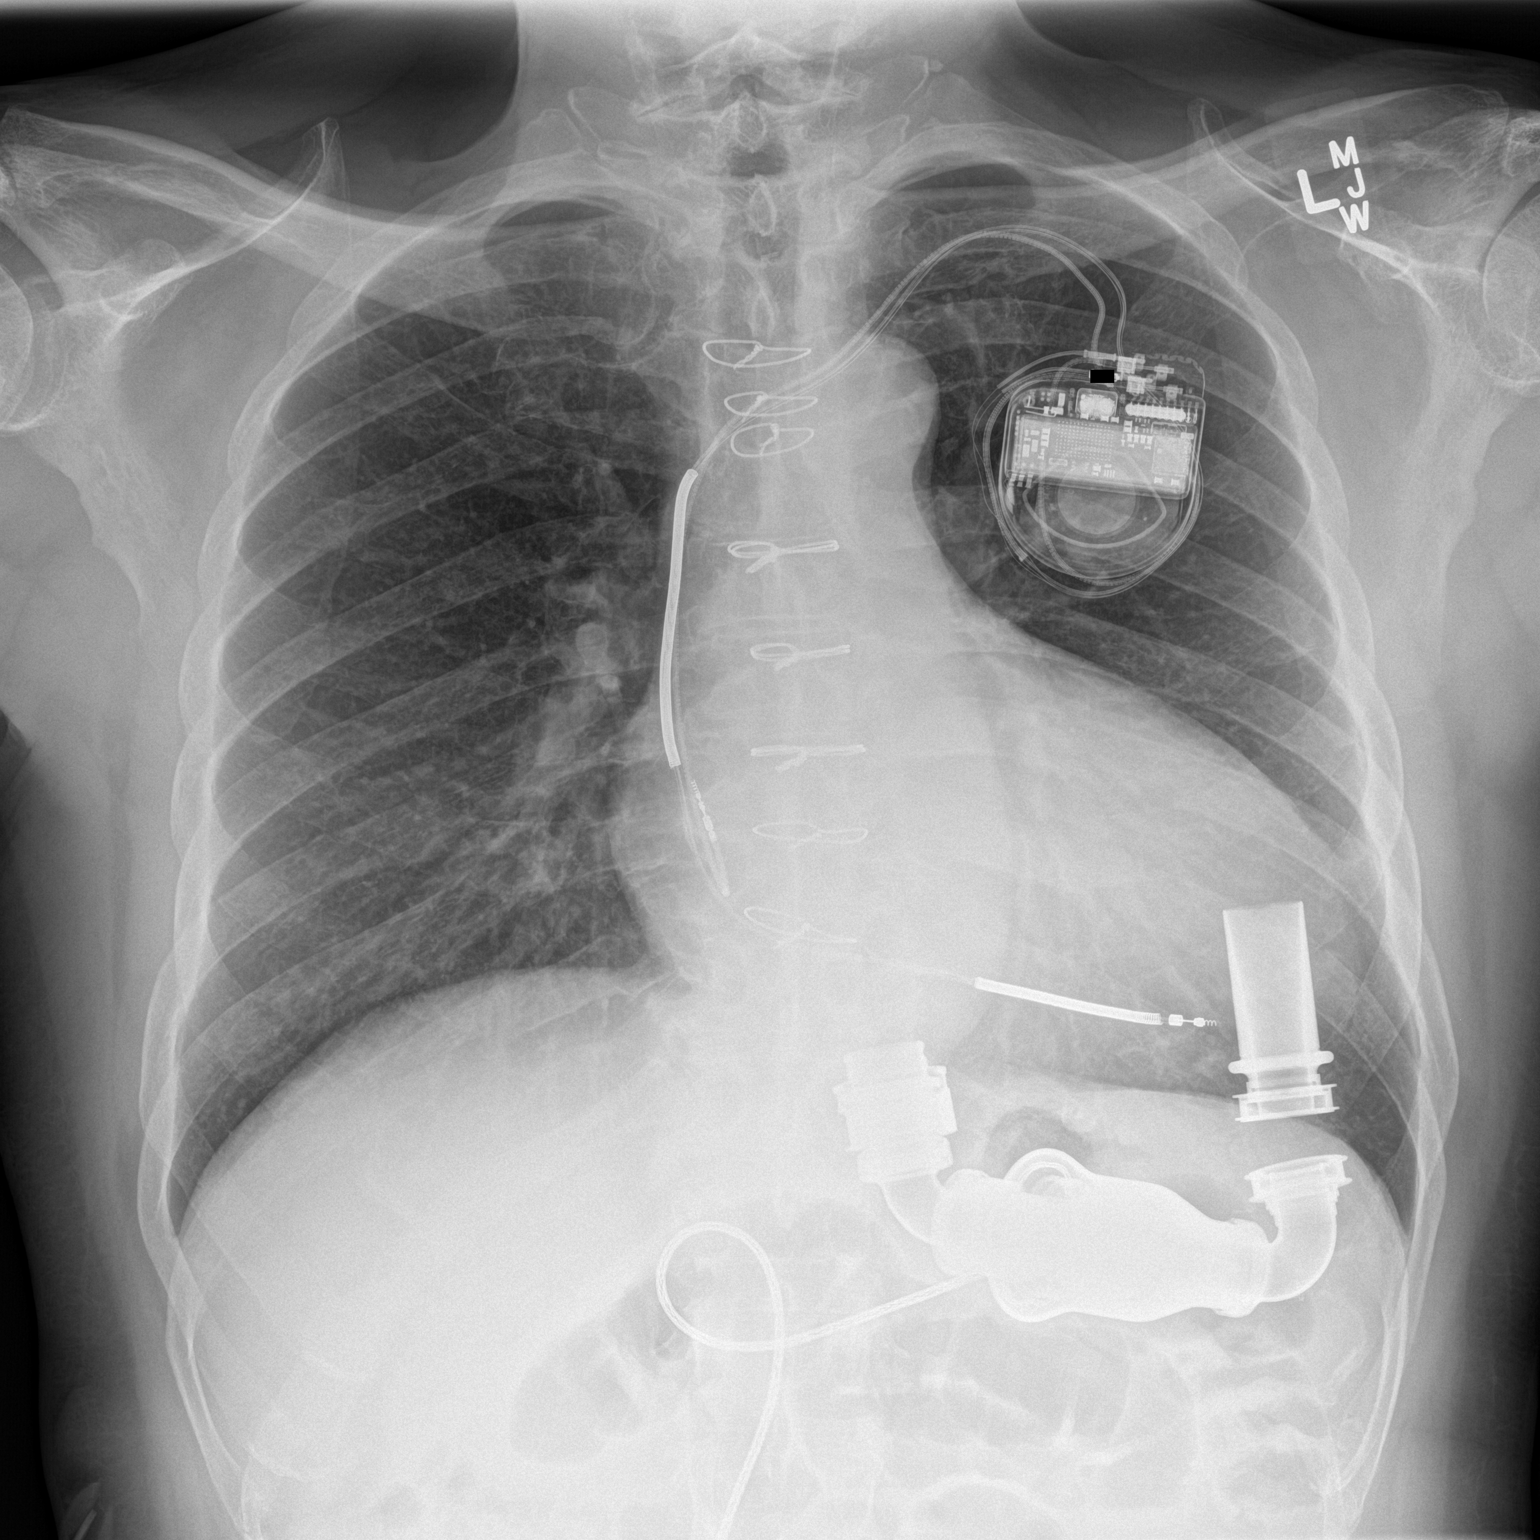

[chest lat]
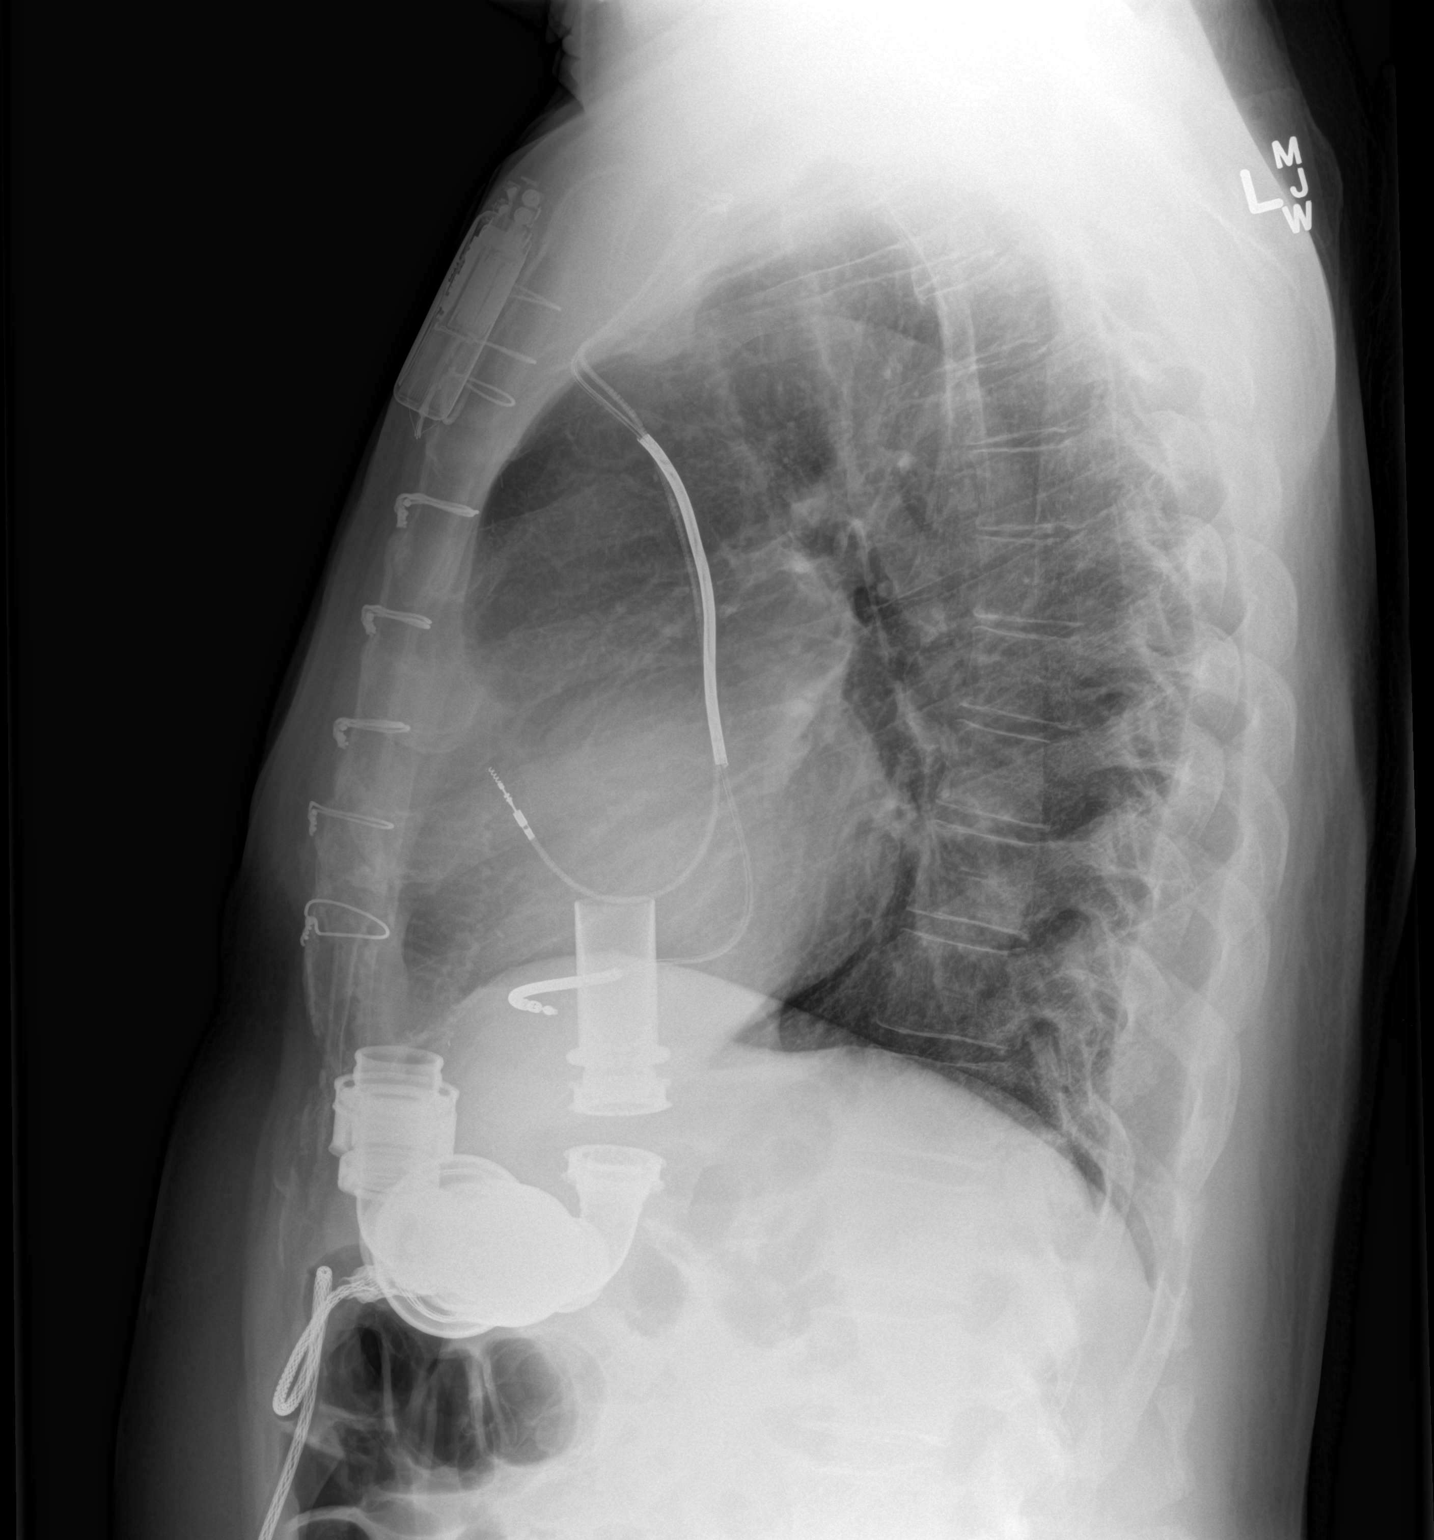

[2 of 2 positions shown; findings below may reference images not displayed]

FINDINGS: Mediastinum and hilar structures are normal. Cardiac pacer with lead
tips in right atrium right ventricle. Left ventricular assist device
in stable position. Prior median sternotomy. Stable cardiomegaly. No
pulmonary venous congestion. No focal infiltrate. No acute bony
abnormality.
IMPRESSION: Left ventricular assist device in stable position as is cardiac
pacer. Stable cardiomegaly. No acute abnormality.

## 2017-06-01 MED FILL — CLINDAMYCIN-BENZOYL PEROX 1: 1-5 | 20 days supply | Qty: 25 | Fill #0

## 2018-11-29 ENCOUNTER — Ambulatory Visit: Payer: Self-pay

## 2018-11-29 ENCOUNTER — Encounter: Payer: Self-pay | Admitting: Family Medicine

## 2018-11-29 ENCOUNTER — Ambulatory Visit: Payer: Medicaid Other | Admitting: Family Medicine

## 2018-11-29 ENCOUNTER — Other Ambulatory Visit: Payer: Self-pay

## 2018-11-29 VITALS — BP 158/98 | Ht 74.0 in | Wt 248.0 lb

## 2018-11-29 DIAGNOSIS — M7631 Iliotibial band syndrome, right leg: Secondary | ICD-10-CM | POA: Diagnosis not present

## 2018-11-29 DIAGNOSIS — M25561 Pain in right knee: Secondary | ICD-10-CM

## 2018-11-29 MED ORDER — METHYLPREDNISOLONE ACETATE 40 MG/ML IJ SUSP
40.0000 mg | Freq: Once | INTRAMUSCULAR | Status: AC
  Administered 2018-11-29: 40 mg via INTRA_ARTICULAR

## 2018-11-29 NOTE — Patient Instructions (Addendum)
Your pain is due to arthritis. We have ordered PT for you. These are the different medications you can take for this: Tylenol 500mg  1-2 tabs three times a day for pain. Capsaicin, aspercreme, or biofreeze topically up to four times a day may also help with pain. Some supplements that may help for arthritis: Boswellia extract, curcumin, pycnogenol Aleve 1-2 tabs twice a day with food If cortisone injections do not help, there are different types of shots that may help but they take longer to take effect. It's important that you continue to stay active. Straight leg raises, knee extensions 3 sets of 10 once a day (add ankle weight if these become too easy). Shoe inserts with good arch support may be helpful. Heat or ice 15 minutes at a time 3-4 times a day as needed to help with pain. Water aerobics and cycling with low resistance are the best two types of exercise for arthritis though any exercise is ok as long as it doesn't worsen the pain. Follow up with me in 6-8 weeks or sooner if still hurting.  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 Ice over area of pain 3-4 times a day for 15 minutes at a time Perform stretches and exercises provided today

## 2018-11-29 NOTE — Progress Notes (Signed)
[facility] [address] [place], [place] [address] Phone: [phone] Fax: [phone]   Patient Name: Robert [name] Date of Birth: [birth year] Medical Record Number: [id number] Gender: male Date of Encounter: 11/29/2018  CC: Right knee pain  HPI: Pt is here c/o right knee pain. Pain started 3 weeks ago, but has been off and on for years. No MOI or inciting event, but has been working in the yard more. Aggravating factors include long walking and squatting. Alleviating factors include ice and Aleve. No radiating symptoms. Describes the pain as dull and achy. No hx of trauma or injury to area in the past.  Denies locking, catching, or weakness.  Past Medical History:  Diagnosis Date  . AICD (automatic cardioverter/defibrillator) present 2011  . Bronchitis   . Congestive heart failure ([place]) 2011  . Hypertension 2000  . LVAD (left ventricular assist device) present ([place]) 2014  . Pneumonia   . Pulmonary hypertension ([place])     Current Outpatient Medications on File Prior to Visit  Medication Sig Dispense Refill  . allopurinol (ZYLOPRIM) 300 MG tablet TAKE 1 TABLET BY MOUTH EVERY DAY. 30 tablet 2  . amLODipine (NORVASC) 5 MG tablet Take 1 tablet (5 mg total) by mouth daily. 14 tablet 0  . aspirin EC 325 MG tablet Take 1 tablet (325 mg total) by mouth daily. 30 tablet 6  . cefTRIAXone 2 g in dextrose 5 % 50 mL Inject 2 g into the vein daily. 42 Dose 0  . furosemide (LASIX) 40 MG tablet Take 1 tablet (40 mg total) by mouth daily as needed. Take as directed by clinic 30 tablet 3  . hydrALAZINE (APRESOLINE) 50 MG tablet Take 0.5 tablets (25 mg total) by mouth 2 (two) times daily. 30 tablet 6  . LOVENOX 100 MG/ML injection Inject 1 mL (100 mg total) into the skin every 12 (twelve) hours. 10 Syringe 1  . mexiletine (MEXITIL) 150 MG capsule Take 1 capsule (150 mg total) by mouth 2 (two) times daily. 60 capsule 5  . Multiple Vitamin (MULTIVITAMIN) tablet  Take 1 tablet by mouth daily.    . pantoprazole (PROTONIX) 40 MG tablet Take 1 tablet (40 mg total) by mouth daily. 30 tablet 3  . pantoprazole (PROTONIX) 40 MG tablet TAKE 1 TABLET BY MOUTH EVERY DAY. 90 tablet 3  . sacubitril-valsartan (ENTRESTO) 97-103 MG Take 1 tablet by mouth 2 (two) times daily. 60 tablet 3  . traMADol (ULTRAM) 50 MG tablet Take 1 tablet (50 mg total) by mouth every 8 (eight) hours as needed for moderate pain. 30 tablet 5  . warfarin (COUMADIN) 5 MG tablet Take 2 tablets (10 mg total) by mouth daily. 60 tablet 5   No current facility-administered medications on file prior to visit.     Past Surgical History:  Procedure Laterality Date  . APPLICATION OF WOUND VAC N/A 03/12/2016   Procedure: APPLICATION OF WOUND VAC;  Surgeon: Ivin Poot, MD;  Location: [facility];  Service: Thoracic;  Laterality: N/A;  . CARDIAC CATHETERIZATION    . CARDIAC CATHETERIZATION N/A 11/04/2014   Procedure: Right Heart Cath;  Surgeon: Jolaine Artist, MD;  Location: [facility] CV LAB;  Service: Cardiovascular;  Laterality: N/A;  . CARDIAC CATHETERIZATION N/A 11/06/2015   Procedure: Right Heart Cath;  Surgeon: Jolaine Artist, MD;  Location: [facility] CV LAB;  Service: Cardiovascular;  Laterality: N/A;  . CARDIAC DEFIBRILLATOR PLACEMENT  06/03/11  . COLONOSCOPY  12/29/2011   Procedure:  COLONOSCOPY;  Surgeon: Iva Boop, MD;  Location: [facility];  Service: Endoscopy;  Laterality: N/A;  . INSERTION OF IMPLANTABLE LEFT VENTRICULAR ASSIST DEVICE N/A 06/02/2012   Procedure: INSERTION OF IMPLANTABLE LEFT VENTRICULAR ASSIST DEVICE;  Surgeon: Kerin Perna, MD;  Location: [facility] OR;  Service: Open Heart Surgery;  Laterality: N/A;  Nitric Oxide; TEE; Medtronic AICD  . INTRA-AORTIC BALLOON PUMP INSERTION N/A 05/31/2012   Procedure: INTRA-AORTIC BALLOON PUMP INSERTION;  Surgeon: Dolores Patty, MD;  Location: [facility] CATH LAB;  Service: Cardiovascular;  Laterality: N/A;  . INTRAOPERATIVE  TRANSESOPHAGEAL ECHOCARDIOGRAM N/A 06/02/2012   Procedure: INTRAOPERATIVE TRANSESOPHAGEAL ECHOCARDIOGRAM;  Surgeon: Kerin Perna, MD;  Location[facility] OR;  Service: Open Heart Surgery;  Laterality: N/A;  . IR GENERIC HISTORICAL  06/30/2016   IR FLUORO GUIDE CV MIDLINE PICC RIGHT 06/30/2016 Hoyt Koch, PA [facility]  . IR GENERIC HISTORICAL  06/30/2016   IR US GUIDE VASC ACCESS RIGHT 06/30/2016 Hoyt Koch, PA [facility]  . LEFT AND RIGHT HEART CATHETERIZATION WITH CORONARY ANGIOGRAM N/A 08/17/2011   Procedure: LEFT AND RIGHT HEART CATHETERIZATION WITH CORONARY ANGIOGRAM;  Surgeon: Thurmon Fair, MD;  [facility] C[facility] LAB;  Service: Cardiovascular;  Laterality: N/A;  . RIGHT HEART CATHETERIZATION N/A 05/30/2012   Procedure: RIGHT HEART CATH;  Surgeon: Dolores Patty, MD;  Location: [facility] CATH LAB;  Service: Cardiovascular;  Laterality: N/A;  . RIGHT HEART CATHETERIZATION N/A 06/10/2014   Procedure: RIGHT HEART CATH;  Surgeon: Dolores Patty, MD;  Location: [facility] CATH LAB;  Service: Cardiovascular;  Laterality: N/A;  . STERNAL WOUND DEBRIDEMENT N/A 03/12/2016   Procedure: WOUND DEBRIDEMENT AT LVAD SITE;  Surgeon: Kerin Perna, MD;  Location: [facility] OR;  Service: Thoracic;  Laterality: N/A;    Allergies  Allergen Reactions  . Metoprolol Other (See Comments)    Passes out  . Imdur [Isosorbide Nitrate] Other (See Comments)    HEADACHE--REACTION IS SIDE EFFECT OF NITRATES    Social History   Socioeconomic History  . Marital status: Single    Spouse name: Not on file  . Number of children: 2  . Years of education: Not on file  . Highest education level: Not on file  Occupational History  . Occupation: Disabled  Social Needs  . Financial resource strain: Not on file  . Food insecurity    Worry: Not on file    Inability: Not on file  . Transportation needs    Medical: Not on file    Non-medical: Not on file  Tobacco Use  . Smoking status: Never Smoker  .  Smokeless tobacco: Never Used  Substance and Sexual Activity  . Alcohol use: No  . Drug use: No  . Sexual activity: Not Currently  Lifestyle  . Physical activity    Days per week: Not on file    Minutes per session: Not on file  . Stress: Not on file  Relationships  . Social Musician on phone: Not on file    Gets together: Not on file    Attends religious service: Not on file    Active member of club or organization: Not on file    Attends meetings of clubs or organizations: Not on file    Relationship status: Not on file  . Intimate partner violence    Fear of current or ex partner: Not on file    Emotionally abused: Not on file    Physically abused: Not on file  Forced sexual activity: Not on file  Other Topics Concern  . Not on file  Social History Narrative  . Not on file    History reviewed. No pertinent family history.  BP (!) 158/98   Ht 6\' 2"  (1.88 m)   Wt 248 lb (112.5 kg)   BMI 31.84 kg/m   ROS:  See HPI CONST: no F/C, no malaise, no fatigue MSK: See above NEURO: no numbness/tingling, no weakness SKIN: no rash, no lesions HEME: no bleeding, no bruising, no erythema  Objective: Right knee: Prepatellar effusion and Baker's cyst.  Very small effusion.  No bruising, skin changes. Palpation normal with no warmth, joint line tenderness, patellar tenderness, or condyle tenderness.  TTP at distal IT band over femoral condyle. ROM full in flexion and extension and lower leg rotation. Ligaments with solid consistent endpoints including ACL, PCL, LCL, MCL. Negative Mcmurray's and Thessaly tests. Non painful patellar compression. Decreased active ROM with patellar glide with crepitus. Patellar and quadriceps tendons unremarkable. Hamstring and quadriceps strength is normal.  Positive Ober's test Neurovascularly intact.  Left knee: Prepatellar bursitis.  No other swelling, bruising. Palpation normal with no warmth, joint line tenderness,  patellar tenderness, or condyle tenderness. ROM full in flexion and extension and lower leg rotation. Ligaments with solid consistent endpoints including ACL, PCL, LCL, MCL. Negative Mcmurray's and Thessaly tests. Non painful patellar compression. Patellar glide without crepitus. Patellar and quadriceps tendons unremarkable. Hamstring and quadriceps strength is normal.  Neurovascularly intact.  Procedure: After informed written consent timeout was performed, patient was seated on exam table. Right knee was prepped with alcohol swab and utilizing superior lateral approach under US guidance, patient's right knee was injected intraarticularly with 3:1 bupivicaine: depomedrol. Patient tolerated the procedure well without immediate complications.  Assessment and Plan:  1.  Right knee OA Successful injection as above.  Patient was given home exercises for quad strengthening.  We will follow-up in 4-6 weeks, if no improvement consider Visco supplementation versus formal physical therapy.  2.  Right IT band syndrome Likely more incidental finding on exam, but believe is playing a role in lack of patellar mobility.  Given home exercises and stretching.  Recommended massage to the area.  Follow-up in 4-6 weeks.  Judge Stallominic Clay Menser, DO, ATC Sports Medicine Fellow

## 2019-01-17 ENCOUNTER — Other Ambulatory Visit: Payer: Self-pay

## 2019-01-17 ENCOUNTER — Ambulatory Visit: Payer: Medicaid Other | Admitting: Family Medicine

## 2019-01-17 VITALS — BP 132/92 | Ht 74.0 in | Wt 240.0 lb

## 2019-01-17 DIAGNOSIS — M25561 Pain in right knee: Secondary | ICD-10-CM

## 2019-01-17 NOTE — Patient Instructions (Addendum)
Your pain is due to arthritis. These are the different medications you can take for this: Tylenol 500mg  1-2 tabs three times a day for pain. Capsaicin, aspercreme, or biofreeze topically up to four times a day may also help with pain. Some supplements that may help for arthritis: Boswellia extract, curcumin, pycnogenol Aleve 1-2 tabs twice a day with food We can repeat cortisone shots up to every 3 months if your pain is bad enough. If cortisone injections do not help, there are different types of shots that may help but they take longer to take effect. It's important that you continue to stay active. Straight leg raises, knee extensions 3 sets of 10 once a day (add ankle weight if these become too easy). Shoe inserts with good arch support may be helpful. Heat or ice 15 minutes at a time 3-4 times a day as needed to help with pain. Water aerobics and cycling with low resistance are the best two types of exercise for arthritis though any exercise is ok as long as it doesn't worsen the pain. Follow up with me as needed.

## 2019-01-19 ENCOUNTER — Encounter: Payer: Self-pay | Admitting: Family Medicine

## 2019-01-19 NOTE — Progress Notes (Signed)
[facility] [address] [place], [place] [address] Phone: [phone] Fax: [phone]   Patient Name: Robert [name] Date of Birth: July 10, 1955 Medical Record Number: [id number] Gender: male Date of Encounter: 01/17/2019  CC: Right knee pain  HPI:  8/26: Pt is here c/o right knee pain. Pain started 3 weeks ago, but has been off and on for years. No MOI or inciting event, but has been working in the yard more. Aggravating factors include long walking and squatting. Alleviating factors include ice and Aleve. No radiating symptoms. Describes the pain as dull and achy. No hx of trauma or injury to area in the past.  Denies locking, catching, or weakness.  10/14: Patient reports he's doing much better than before. Used to have to stand a while before he could get going - now pain is minimal. No skin changes. Not taking any medicine for his knee pain.    Past Medical History:  Diagnosis Date  . AICD (automatic cardioverter/defibrillator) present 2011  . Bronchitis   . Congestive heart failure ([place]) 2011  . Hypertension 2000  . LVAD (left ventricular assist device) present ([place]) 2014  . Pneumonia   . Pulmonary hypertension ([place])     Current Outpatient Medications on File Prior to Visit  Medication Sig Dispense Refill  . aspirin EC 325 MG tablet Take 1 tablet (325 mg total) by mouth daily. 30 tablet 6  . cefTRIAXone 2 g in dextrose 5 % 50 mL Inject 2 g into the vein daily. 42 Dose 0  . hydrALAZINE (APRESOLINE) 50 MG tablet Take 0.5 tablets (25 mg total) by mouth 2 (two) times daily. 30 tablet 6  . LOVENOX 100 MG/ML injection Inject 1 mL (100 mg total) into the skin every 12 (twelve) hours. 10 Syringe 1  . mexiletine (MEXITIL) 150 MG capsule Take 1 capsule (150 mg total) by mouth 2 (two) times daily. 60 capsule 5  . Multiple Vitamin (MULTIVITAMIN) tablet Take 1 tablet by mouth daily.    . pantoprazole (PROTONIX) 40 MG tablet TAKE 1 TABLET BY  MOUTH EVERY DAY. 90 tablet 3  . sacubitril-valsartan (ENTRESTO) 97-103 MG Take 1 tablet by mouth 2 (two) times daily. 60 tablet 3  . traMADol (ULTRAM) 50 MG tablet Take 1 tablet (50 mg total) by mouth every 8 (eight) hours as needed for moderate pain. 30 tablet 5  . warfarin (COUMADIN) 5 MG tablet Take 2 tablets (10 mg total) by mouth daily. 60 tablet 5  . allopurinol (ZYLOPRIM) 300 MG tablet TAKE 1 TABLET BY MOUTH EVERY DAY. 30 tablet 2  . amLODipine (NORVASC) 5 MG tablet Take 1 tablet (5 mg total) by mouth daily. 14 tablet 0  . furosemide (LASIX) 40 MG tablet Take 1 tablet (40 mg total) by mouth daily as needed. Take as directed by clinic 30 tablet 3  . pantoprazole (PROTONIX) 40 MG tablet Take 1 tablet (40 mg total) by mouth daily. 30 tablet 3   No current facility-administered medications on file prior to visit.     Past Surgical History:  Procedure Laterality Date  . APPLICATION OF WOUND VAC N/A 03/12/2016   Procedure: APPLICATION OF WOUND VAC;  Surgeon: Ivin Poot, MD;  Location: [facility];  Service: Thoracic;  Laterality: N/A;  . CARDIAC CATHETERIZATION    . CARDIAC CATHETERIZATION N/A 11/04/2014   Procedure: Right Heart Cath;  Surgeon: Jolaine Artist, MD;  Location: [place] CV LAB;  Service: Cardiovascular;  Laterality: N/A;  . CARDIAC  CATHETERIZATION N/A 11/06/2015   Procedure: Right Heart Cath;  Surgeon: Dolores Patty, MD;  Location: [facility] INVASIVE CV LAB;  Service: Cardiovascular;  Laterality: N/A;  . CARDIAC DEFIBRILLATOR PLACEMENT  06/03/11  . COLONOSCOPY  12/29/2011   Procedure: COLONOSCOPY;  Surgeon: Iva Boop, MD;  Location: [facility] ENDOSCOPY;  Service: Endoscopy;  Laterality: N/A;  . INSERTION OF IMPLANTABLE LEFT VENTRICULAR ASSIST DEVICE N/A 06/02/2012   Procedure: INSERTION OF IMPLANTABLE LEFT VENTRICULAR ASSIST DEVICE;  Surgeon: Kerin Perna, MD;  Location: [facility] OR;  Service: Open Heart Surgery;  Laterality: N/A;  Nitric Oxide; TEE; Medtronic AICD  . INTRA-AORTIC  BALLOON PUMP INSERTION N/A 05/31/2012   Procedure: INTRA-AORTIC BALLOON PUMP INSERTION;  Surgeon: Dolores Patty, MD;  Location: [facility] CATH LAB;  Service: Cardiovascular;  Laterality: N/A;  . INTRAOPERATIVE TRANSESOPHAGEAL ECHOCARDIOGRAM N/A 06/02/2012   Procedure: INTRAOPERATIVE TRANSESOPHAGEAL ECHOCARDIOGRAM;  Surgeon: Kerin Perna, MD;  Location: [facility] OR;  Service: Open Heart Surgery;  Laterality: N/A;  . IR GENERIC HISTORICAL  06/30/2016   IR FLUORO GUIDE CV MIDLINE PICC RIGHT 06/30/2016 Hoyt Koch, PA [facility]  . IR GENERIC HISTORICAL  06/30/2016   IR US GUIDE VASC ACCESS RIGHT 06/30/2016 Hoyt Koch, PA [facility]  . LEFT AND RIGHT HEART CATHETERIZATION WITH CORONARY ANGIOGRAM N/A 08/17/2011   Procedure: LEFT AND RIGHT HEART CATHETERIZATION WITH CORONARY ANGIOGRAM;  Surgeon: Thurmon Fair, MD;  Location: [facility] CATH LAB;  Service: Cardiovascular;  Laterality: N/A;  . RIGHT HEART CATHETERIZATION N/A 05/30/2012   Procedure: RIGHT HEART CATH;  Surgeon: Dolores Patty, MD;  Location: [facility] CATH LAB;  Service: Cardiovascular;  Laterality: N/A;  . RIGHT HEART CATHETERIZATION N/A 06/10/2014   Procedure: RIGHT HEART CATH;  Surgeon: Dolores Patty, MD;  Location: [facility] CATH LAB;  Service: Cardiovascular;  Laterality: N/A;  . STERNAL WOUND DEBRIDEMENT N/A 03/12/2016   Procedure: WOUND DEBRIDEMENT AT LVAD SITE;  Surgeon: Kerin Perna, MD;  Location: [facility] OR;  Service: Thoracic;  Laterality: N/A;    Allergies  Allergen Reactions  . Metoprolol Other (See Comments)    Passes out  . Imdur [Isosorbide Nitrate] Other (See Comments)    HEADACHE--REACTION IS SIDE EFFECT OF NITRATES    Social History   Socioeconomic History  . Marital status: Single    Spouse name: Not on file  . Number of children: 2  . Years of education: Not on file  . Highest education level: Not on file  Occupational History  . Occupation: Disabled  Social Needs  . Financial resource strain: Not  on file  . Food insecurity    Worry: Not on file    Inability: Not on file  . Transportation needs    Medical: Not on file    Non-medical: Not on file  Tobacco Use  . Smoking status: Never Smoker  . Smokeless tobacco: Never Used  Substance and Sexual Activity  . Alcohol use: No  . Drug use: No  . Sexual activity: Not Currently  Lifestyle  . Physical activity    Days per week: Not on file    Minutes per session: Not on file  . Stress: Not on file  Relationships  . Social Musician on phone: Not on file    Gets together: Not on file    Attends religious service: Not on file    Active member of club or organization: Not on file    Attends meetings of clubs or organizations: Not on file  Relationship status: Not on file  . Intimate partner violence    Fear of current or ex partner: Not on file    Emotionally abused: Not on file    Physically abused: Not on file    Forced sexual activity: Not on file  Other Topics Concern  . Not on file  Social History Narrative  . Not on file    History reviewed. No pertinent family history.  BP (!) 132/92   Ht 6\' 2"  (1.88 m)   Wt 240 lb (108.9 kg)   BMI 30.81 kg/m   ROS:  See HPI.  Objective: Right knee: No gross deformity, ecchymoses, swelling. No TTP. FROM with 5/5 strength flexion and extension. Negative ant/post drawers. Negative valgus/varus testing. Negative lachmans. Negative mcmurrays, apleys. NV intact distally.  Assessment and Plan:  1.  Right knee pain - 2/2 osteoarthritis with an element of IT band syndrome.  He's doing much better following injections, HEP.  Tylenol, aleve, topical medications if needed.  F/u prn.

## 2021-04-07 ENCOUNTER — Other Ambulatory Visit (HOSPITAL_COMMUNITY): Payer: Self-pay | Admitting: Unknown Physician Specialty

## 2021-04-07 DIAGNOSIS — Z941 Heart transplant status: Secondary | ICD-10-CM

## 2021-04-09 ENCOUNTER — Ambulatory Visit (HOSPITAL_COMMUNITY)
Admission: RE | Admit: 2021-04-09 | Discharge: 2021-04-09 | Disposition: A | Discharge status: Home / Self Care | Payer: Medicare Other | Source: Ambulatory Visit | Attending: Cardiology | Admitting: Cardiology

## 2021-04-09 ENCOUNTER — Other Ambulatory Visit (HOSPITAL_COMMUNITY): Payer: Medicaid Other

## 2021-04-09 ENCOUNTER — Other Ambulatory Visit: Payer: Self-pay

## 2021-04-09 DIAGNOSIS — Z941 Heart transplant status: Secondary | ICD-10-CM | POA: Diagnosis not present

## 2021-04-09 LAB — BASIC METABOLIC PANEL
Anion gap: 5 (ref 5–15)
BUN: 9 mg/dL (ref 8–23)
CO2: 28 mmol/L (ref 22–32)
Calcium: 8.6 mg/dL — ABNORMAL LOW (ref 8.9–10.3)
Chloride: 104 mmol/L (ref 98–111)
Creatinine, Ser: 1.19 mg/dL (ref 0.61–1.24)
GFR, Estimated: >60 mL/min (ref >60)
Glucose, Bld: 99 mg/dL (ref 70–99)
Potassium: 4.6 mmol/L (ref 3.5–5.1)
Sodium: 137 mmol/L (ref 135–145)

## 2021-04-11 LAB — TACROLIMUS LEVEL: Tacrolimus (FK506) - LabCorp: 24.1 ng/mL — ABNORMAL HIGH (ref 2.0–20.0)

## 2021-04-14 ENCOUNTER — Ambulatory Visit (HOSPITAL_COMMUNITY)
Admission: RE | Admit: 2021-04-14 | Discharge: 2021-04-14 | Disposition: A | Discharge status: Home / Self Care | Payer: Medicare Other | Source: Ambulatory Visit | Attending: Cardiology | Admitting: Cardiology

## 2021-04-14 ENCOUNTER — Other Ambulatory Visit: Payer: Self-pay

## 2021-04-14 ENCOUNTER — Other Ambulatory Visit (HOSPITAL_COMMUNITY): Payer: Self-pay | Admitting: Unknown Physician Specialty

## 2021-04-14 DIAGNOSIS — Z941 Heart transplant status: Secondary | ICD-10-CM | POA: Insufficient documentation

## 2021-04-14 LAB — BASIC METABOLIC PANEL
Anion gap: 11 (ref 5–15)
BUN: 11 mg/dL (ref 8–23)
CO2: 26 mmol/L (ref 22–32)
Calcium: 9.2 mg/dL (ref 8.9–10.3)
Chloride: 102 mmol/L (ref 98–111)
Creatinine, Ser: 1.11 mg/dL (ref 0.61–1.24)
GFR, Estimated: >60 mL/min (ref >60)
Glucose, Bld: 99 mg/dL (ref 70–99)
Potassium: 4.5 mmol/L (ref 3.5–5.1)
Sodium: 139 mmol/L (ref 135–145)

## 2021-04-16 LAB — TACROLIMUS LEVEL: Tacrolimus (FK506) - LabCorp: 9.7 ng/mL (ref 2.0–20.0)

## 2021-04-21 ENCOUNTER — Other Ambulatory Visit (HOSPITAL_COMMUNITY): Payer: Self-pay | Admitting: Internal Medicine

## 2021-09-08 ENCOUNTER — Encounter: Payer: Self-pay | Admitting: *Deleted

## 2021-10-08 ENCOUNTER — Other Ambulatory Visit (HOSPITAL_COMMUNITY): Payer: Self-pay | Admitting: *Deleted

## 2021-10-08 DIAGNOSIS — Z941 Heart transplant status: Secondary | ICD-10-CM

## 2021-10-13 ENCOUNTER — Ambulatory Visit (HOSPITAL_COMMUNITY)
Admission: RE | Admit: 2021-10-13 | Discharge: 2021-10-13 | Disposition: A | Discharge status: Home / Self Care | Payer: 59 | Source: Ambulatory Visit | Attending: Cardiology | Admitting: Cardiology

## 2021-10-13 DIAGNOSIS — Z941 Heart transplant status: Secondary | ICD-10-CM | POA: Diagnosis present

## 2021-10-13 LAB — CBC WITH DIFFERENTIAL/PLATELET
Abs Immature Granulocytes: 0.04 10*3/uL (ref 0.00–0.07)
Basophils Absolute: 0 10*3/uL (ref 0.0–0.1)
Basophils Relative: 0 %
Eosinophils Absolute: 0.1 10*3/uL (ref 0.0–0.5)
Eosinophils Relative: 1 %
HCT: 45.1 % (ref 39.0–52.0)
Hemoglobin: 14.7 g/dL (ref 13.0–17.0)
Immature Granulocytes: 1 %
Lymphocytes Relative: 25 %
Lymphs Abs: 2.1 10*3/uL (ref 0.7–4.0)
MCH: 29 pg (ref 26.0–34.0)
MCHC: 32.6 g/dL (ref 30.0–36.0)
MCV: 89 fL (ref 80.0–100.0)
Monocytes Absolute: 0.7 10*3/uL (ref 0.1–1.0)
Monocytes Relative: 8 %
Neutro Abs: 5.6 10*3/uL (ref 1.7–7.7)
Neutrophils Relative %: 65 %
Platelets: 198 10*3/uL (ref 150–400)
RBC: 5.07 MIL/uL (ref 4.22–5.81)
RDW: 13 % (ref 11.5–15.5)
WBC: 8.4 10*3/uL (ref 4.0–10.5)
nRBC: 0 % (ref 0.0–0.2)

## 2021-10-13 LAB — BASIC METABOLIC PANEL
Anion gap: 9 (ref 5–15)
BUN: 11 mg/dL (ref 8–23)
CO2: 25 mmol/L (ref 22–32)
Calcium: 8.6 mg/dL — ABNORMAL LOW (ref 8.9–10.3)
Chloride: 104 mmol/L (ref 98–111)
Creatinine, Ser: 1.11 mg/dL (ref 0.61–1.24)
GFR, Estimated: >60 mL/min (ref >60)
Glucose, Bld: 103 mg/dL — ABNORMAL HIGH (ref 70–99)
Potassium: 3.6 mmol/L (ref 3.5–5.1)
Sodium: 138 mmol/L (ref 135–145)

## 2021-10-15 LAB — TACROLIMUS LEVEL: Tacrolimus (FK506) - LabCorp: 7 ng/mL (ref 2.0–20.0)

## 2021-10-16 ENCOUNTER — Encounter (HOSPITAL_COMMUNITY): Payer: Self-pay | Admitting: *Deleted

## 2021-10-16 NOTE — Progress Notes (Signed)
Faxed CBC, BMET, and FK506 results to [facility] Transplant coordinator as requested.   Alyce Pagan RN VAD Coordinator  Office: [phone]  24/7 Pager: [phone]

## 2021-12-17 ENCOUNTER — Other Ambulatory Visit: Payer: Self-pay | Admitting: Internal Medicine

## 2022-01-29 ENCOUNTER — Encounter: Payer: Self-pay | Admitting: Internal Medicine

## 2022-12-16 ENCOUNTER — Encounter: Payer: Self-pay | Admitting: Internal Medicine

## 2022-12-21 ENCOUNTER — Telehealth: Payer: Self-pay | Admitting: *Deleted

## 2022-12-21 NOTE — Telephone Encounter (Signed)
Please review pt.chart and advise if can be done in [facility]?

## 2022-12-23 NOTE — Telephone Encounter (Signed)
Noted on PV chart ?

## 2022-12-30 ENCOUNTER — Telehealth: Payer: Self-pay

## 2022-12-30 NOTE — Telephone Encounter (Signed)
Unable to reach patient for PV after multiple attempts. Unable to leave a VM on his cell or home number. Pt will need to call and have PV r/s by 5 pm to avoid cancellation of his upcoming colonoscopy with Dr. Leone Payor. No show letter pending to be sent via mychart and address on file at end of day.

## 2022-12-30 NOTE — Telephone Encounter (Signed)
PT has rescheduled PV to 10/9

## 2023-01-05 ENCOUNTER — Telehealth: Payer: Self-pay

## 2023-01-05 NOTE — Telephone Encounter (Signed)
As RN was doing chart prep, it was observed that patient had LVAD transplant in 12/2021. Patient will need an office visit prior to having a colonoscopy, which will need to be scheduled at [facility] hospital.   RN contacting GI MD regarding this information.

## 2023-01-05 NOTE — Telephone Encounter (Signed)
Patient does not have an LVAD  He did - but then received a heart transplant  So is ok for direct appointment in [facility] as long as no other contraindications

## 2023-01-12 ENCOUNTER — Ambulatory Visit (AMBULATORY_SURGERY_CENTER): Payer: 59

## 2023-01-12 VITALS — Ht 74.0 in | Wt 240.0 lb

## 2023-01-12 DIAGNOSIS — Z1211 Encounter for screening for malignant neoplasm of colon: Secondary | ICD-10-CM

## 2023-01-12 NOTE — Progress Notes (Signed)
No egg or soy allergy known to patient  No issues known to pt with past sedation with any surgeries or procedures Patient denies ever being told they had issues or difficulty with intubation  No FH of Malignant Hyperthermia Pt is not on diet pills Pt is not on  home 02  Pt is not on blood thinners  Pt denies issues with constipation  No A fib or A flutter Have any cardiac testing pending--no Pt can ambulate independently Pt denies use of chewing tobacco Discussed diabetic I weight loss medication holds Discussed NSAID holds Checked BMI Pt instructed to use Single[employer] or GoodRx for a  reduction on prep  Patient's chart reviewed by Cathlyn Parsons CNRA prior to previsit and patient appropriate for the [facility].  Pre visit completed and red dot placed by patient's name on their procedure day (on provider's schedule).

## 2023-01-15 ENCOUNTER — Encounter: Payer: Self-pay | Admitting: Certified Registered Nurse Anesthetist

## 2023-01-19 ENCOUNTER — Ambulatory Visit: Payer: 59 | Admitting: Internal Medicine

## 2023-01-19 ENCOUNTER — Encounter: Payer: Self-pay | Admitting: Internal Medicine

## 2023-01-19 VITALS — BP 168/98 | HR 67 | Temp 98.4°F | Resp 14 | Ht 74.0 in | Wt 240.0 lb

## 2023-01-19 DIAGNOSIS — D125 Benign neoplasm of sigmoid colon: Secondary | ICD-10-CM

## 2023-01-19 DIAGNOSIS — Z1211 Encounter for screening for malignant neoplasm of colon: Secondary | ICD-10-CM | POA: Diagnosis not present

## 2023-01-19 MED ORDER — SODIUM CHLORIDE 0.9 % IV SOLN: 500.0000 mL | Freq: Once | INTRAVENOUS | Status: DC

## 2023-01-19 NOTE — Op Note (Signed)
[facility] Patient Name: Robert [name] Procedure Date: 01/19/2023 8:27 AM MRN: [id number] Endoscopist: Iva Boop , MD, [id number] Age: 67 Referring MD:  Date of Birth: 1955/07/11 Gender: Male Account #: [id number] Procedure:                Colonoscopy Indications:              Screening for colorectal malignant neoplasm, Last                            colonoscopy: 2013 Medicines:                Monitored Anesthesia Care Procedure:                Pre-Anesthesia Assessment:                           - Prior to the procedure, a History and Physical                            was performed, and patient medications and                            allergies were reviewed. The patient's tolerance of                            previous anesthesia was also reviewed. The risks                            and benefits of the procedure and the sedation                            options and risks were discussed with the patient.                            All questions were answered, and informed consent                            was obtained. Prior Anticoagulants: The patient has                            taken no anticoagulant or antiplatelet agents. ASA                            Grade Assessment: III - A patient with severe                            systemic disease. After reviewing the risks and                            benefits, the patient was deemed in satisfactory                            condition to undergo the procedure.  After obtaining informed consent, the colonoscope                            was passed under direct vision. Throughout the                            procedure, the patient's blood pressure, pulse, and                            oxygen saturations were monitored continuously. The                            CF [id number] #[id number] was introduced through the anus                            and advanced to the the cecum,  identified by                            appendiceal orifice and ileocecal valve. The                            colonoscopy was performed without difficulty. The                            patient tolerated the procedure well. The quality                            of the bowel preparation was good. The ileocecal                            valve, appendiceal orifice, and rectum were                            photographed. The bowel preparation used was                            Miralax via split dose instruction. Scope In: 8:41:37 AM Scope Out: 8:56:11 AM Scope Withdrawal Time: 0 hours 11 minutes 7 seconds  Total Procedure Duration: 0 hours 14 minutes 34 seconds  Findings:                 The perianal and digital rectal examinations were                            normal.                           A diminutive polyp was found in the proximal                            sigmoid colon. The polyp was sessile. The polyp was                            removed with a cold snare. Resection and retrieval  were complete. Verification of patient                            identification for the specimen was done. Estimated                            blood loss was minimal.                           The exam was otherwise without abnormality on                            direct and retroflexion views. Complications:            No immediate complications. Estimated Blood Loss:     Estimated blood loss was minimal. Impression:               - One diminutive polyp in the proximal sigmoid                            colon, removed with a cold snare. Resected and                            retrieved.                           - The examination was otherwise normal on direct                            and retroflexion views. Recommendation:           - Patient has a contact number available for                            emergencies. The signs and symptoms of potential                             delayed complications were discussed with the                            patient. Return to normal activities tomorrow.                            Written discharge instructions were provided to the                            patient.                           - Resume previous diet.                           - Continue present medications.                           - Repeat colonoscopy is recommended. The  colonoscopy date will be determined after pathology                            results from today's exam become available for                            review. Iva Boop, MD 01/19/2023 9:07:43 AM This report has been signed electronically.

## 2023-01-19 NOTE — Progress Notes (Signed)
Called to room to assist during endoscopic procedure.  Patient ID and intended procedure confirmed with present staff. Received instructions for my participation in the procedure from the performing physician.  

## 2023-01-19 NOTE — Progress Notes (Signed)
[facility] History and Physical   Primary Care Physician:  Jason Coop, MD   Reason for Procedure:    Encounter Diagnosis  Name Primary?   Special screening for malignant neoplasms, colon Yes     Plan:    colonoscopy     HPI: Robert [name] is a 68 y.o. 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/2016   Hypertension 2000   LVAD (left ventricular assist device) present (HCC) 2014   Pneumonia    Pulmonary hypertension (HCC)     Past Surgical History:  Procedure Laterality Date   APPLICATION OF WOUND VAC N/A 03/12/2016   Procedure: APPLICATION OF WOUND VAC;  Surgeon: Kerin Perna, MD;  Location: [facility] OR;  Service: Thoracic;  Laterality: N/A;   CARDIAC CATHETERIZATION     CARDIAC CATHETERIZATION N/A 11/04/2014   Procedure: Right Heart Cath;  Surgeon: Dolores Patty, MD;  Location: [facility] INVASIVE CV LAB;  Service: Cardiovascular;  Laterality: N/A;   CARDIAC CATHETERIZATION N/A 11/06/2015   Procedure: Right Heart Cath;  Surgeon: Dolores Patty, MD;  Location: [facility] INVASIVE CV LAB;  Service: Cardiovascular;  Laterality: N/A;   CARDIAC DEFIBRILLATOR PLACEMENT  06/03/11   COLONOSCOPY  12/29/2011   Procedure: COLONOSCOPY;  Surgeon: Iva Boop, MD;  Location: [facility] ENDOSCOPY;  Service: Endoscopy;  Laterality: N/A;   INSERTION OF IMPLANTABLE LEFT VENTRICULAR ASSIST DEVICE N/A 06/02/2012   Procedure: INSERTION OF IMPLANTABLE LEFT VENTRICULAR ASSIST DEVICE;  Surgeon: Kerin Perna, MD;  Location: [facility] OR;  Service: Open Heart Surgery;  Laterality: N/A;  Nitric Oxide; TEE; [employer] AICD   INTRA-AORTIC BALLOON PUMP INSERTION N/A 05/31/2012   Procedure: INTRA-AORTIC BALLOON PUMP INSERTION;  Surgeon: Dolores Patty, MD;  Location: [facility] CATH LAB;  Service: Cardiovascular;  Laterality: N/A;    INTRAOPERATIVE TRANSESOPHAGEAL ECHOCARDIOGRAM N/A 06/02/2012   Procedure: INTRAOPERATIVE TRANSESOPHAGEAL ECHOCARDIOGRAM;  Surgeon: Kerin Perna, MD;  Location: [facility] OR;  Service: Open Heart Surgery;  Laterality: N/A;   IR GENERIC HISTORICAL  06/30/2016   IR FLUORO GUIDE CV MIDLINE PICC RIGHT 06/30/2016 Hoyt Koch, PA [facility]-INTERV RAD   IR GENERIC HISTORICAL  06/30/2016   IR US GUIDE VASC ACCESS RIGHT 06/30/2016 Hoyt Koch, PA [facility]   LEFT AND RIGHT HEART CATHETERIZATION WITH CORONARY ANGIOGRAM N/A 08/17/2011   Procedure: LEFT AND RIGHT HEART CATHETERIZATION WITH CORONARY ANGIOGRAM;  Surgeon: Thurmon Fair, MD;  Location: [facility] CATH LAB;  Service: Cardiovascular;  Laterality: N/A;   RIGHT HEART CATHETERIZATION N/A 05/30/2012   Procedure: RIGHT HEART CATH;  Surgeon: Dolores Patty, MD;  Location: [facility] CATH LAB;  Service: Cardiovascular;  Laterality: N/A;   RIGHT HEART CATHETERIZATION N/A 06/10/2014   Procedure: RIGHT HEART CATH;  Surgeon: Dolores Patty, MD;  Location: [facility] CATH LAB;  Service: Cardiovascular;  Laterality: N/A;   STERNAL WOUND DEBRIDEMENT N/A 03/12/2016   Procedure: WOUND DEBRIDEMENT AT LVAD SITE;  Surgeon: Kerin Perna, MD;  Location: [facility] OR;  Service: Thoracic;  Laterality: N/A;    Prior to Admission medications   Medication Sig Start Date End Date Taking? Authorizing Provider  aspirin EC 81 MG tablet Take by mouth. 10/02/21  Yes [provider]  mycophenolate (CELLCEPT) 500 MG tablet  12/21/21  Yes [provider]  pantoprazole (PROTONIX) 40 MG tablet TAKE 1 TABLET BY MOUTH  EVERY DAY. 07/30/16  Yes Bensimhon, Bevelyn Buckles, MD  tacrolimus (PROGRAF) 1 MG capsule    Yes [provider]  amLODipine (NORVASC) 5 MG tablet Take 1 tablet (5 mg total) by mouth daily. 05/06/16 08/04/16  Tonye Becket D, NP  Multiple Vitamin (MULTIVITAMIN) tablet Take 1 tablet by mouth daily. Patient not taking: Reported on 01/12/2023    [provider]    Current Outpatient Medications  Medication Sig Dispense Refill   aspirin EC 81 MG tablet Take by mouth.     mycophenolate (CELLCEPT) 500 MG tablet      pantoprazole (PROTONIX) 40 MG tablet TAKE 1 TABLET BY MOUTH EVERY DAY. 90 tablet 3   tacrolimus (PROGRAF) 1 MG capsule      amLODipine (NORVASC) 5 MG tablet Take 1 tablet (5 mg total) by mouth daily. 14 tablet 0   Multiple Vitamin (MULTIVITAMIN) tablet Take 1 tablet by mouth daily. (Patient not taking: Reported on 01/12/2023)     Current Facility-Administered Medications  Medication Dose Route Frequency Provider Last Rate Last Admin   0.9 %  sodium chloride infusion  500 mL Intravenous Once Iva Boop, MD        Allergies as of 01/19/2023 - Review Complete 01/19/2023  Allergen Reaction Noted   Metoprolol Other (See Comments) 09/25/2012   Imdur [isosorbide nitrate] Other (See Comments) 10/17/2012    Family History  Problem Relation Age of Onset   Colon cancer Neg Hx    Colon polyps Neg Hx    Esophageal cancer Neg Hx    Rectal cancer Neg Hx    Stomach cancer Neg Hx    Prostate cancer Neg Hx     Social History   Socioeconomic History   Marital status: Single    Spouse name: Not on file   Number of children: 2   Years of education: Not on file   Highest education level: Not on file  Occupational History   Occupation: Disabled  Tobacco Use   Smoking status: Never   Smokeless tobacco: Never  Vaping Use   Vaping status: Never Used  Substance and Sexual Activity   Alcohol use: No   Drug use: No   Sexual activity: Not Currently  Other Topics Concern   Not on file  Social History Narrative   Not on file   Social Determinants of Health   Financial Resource Strain: Not on file  Food Insecurity: Not on file  Transportation Needs: Not on file  Physical Activity: Not on file  Stress: Not on file  Social Connections: Not on file  Intimate Partner Violence: Not on file    Review of Systems:  All other  review of systems negative except as mentioned in the HPI.  Physical Exam: Vital signs BP (!) 157/104   Pulse 71   Temp 98.4 F (36.9 C) (Temporal)   Resp 13   Ht 6\' 2"  (1.88 m)   Wt 240 lb (108.9 kg)   SpO2 98%   BMI 30.81 kg/m   General:   Alert,  Well-developed, well-nourished, pleasant and cooperative in NAD Lungs:  Clear throughout to auscultation.   Heart:  Regular rate and rhythm; no murmurs, clicks, rubs,  or gallops. Abdomen:  Soft, nontender and nondistended. Normal bowel sounds.   Neuro/Psych:  Alert and cooperative. Normal mood and affect. A and O x 3   @Robert [name]  Sena Slate, MD, [facility] [phone] (pager) 01/19/2023 8:34 AM@

## 2023-01-19 NOTE — Progress Notes (Signed)
Pt's states no medical or surgical changes since previsit or office visit. 

## 2023-01-19 NOTE — Patient Instructions (Addendum)
Please read handouts provided. Continue present medications. Await pathology results.   YOU HAD AN ENDOSCOPIC PROCEDURE TODAY AT THE  ENDOSCOPY CENTER:   Refer to the procedure report that was given to you for any specific questions about what was found during the examination.  If the procedure report does not answer your questions, please call your gastroenterologist to clarify.  If you requested that your care partner not be given the details of your procedure findings, then the procedure report has been included in a sealed envelope for you to review at your convenience later.  YOU SHOULD EXPECT: Some feelings of bloating in the abdomen. Passage of more gas than usual.  Walking can help get rid of the air that was put into your GI tract during the procedure and reduce the bloating. If you had a lower endoscopy (such as a colonoscopy or flexible sigmoidoscopy) you may notice spotting of blood in your stool or on the toilet paper. If you underwent a bowel prep for your procedure, you may not have a normal bowel movement for a few days.  Please Note:  You might notice some irritation and congestion in your nose or some drainage.  This is from the oxygen used during your procedure.  There is no need for concern and it should clear up in a day or so.  SYMPTOMS TO REPORT IMMEDIATELY:  Following lower endoscopy (colonoscopy or flexible sigmoidoscopy):  Excessive amounts of blood in the stool  Significant tenderness or worsening of abdominal pains  Swelling of the abdomen that is new, acute  Fever of 100F or higher.  For urgent or emergent issues, a gastroenterologist can be reached at any hour by calling [phone]. Do not use MyChart messaging for urgent concerns.    DIET:  We do recommend a small meal at first, but then you may proceed to your regular diet.  Drink plenty of fluids but you should avoid alcoholic beverages for 24 hours.  ACTIVITY:  You should plan to take it easy for  the rest of today and you should NOT DRIVE or use heavy machinery until tomorrow (because of the sedation medicines used during the test).    FOLLOW UP: Our staff will call the number listed on your records the next business day following your procedure.  We will call around 7:15- 8:00 am to check on you and address any questions or concerns that you may have regarding the information given to you following your procedure. If we do not reach you, we will leave a message.     If any biopsies were taken you will be contacted by phone or by letter within the next 1-3 weeks.  Please call us at [phone] if you have not heard about the biopsies in 3 weeks.    SIGNATURES/CONFIDENTIALITY: You and/or your care partner have signed paperwork which will be entered into your electronic medical record.  These signatures attest to the fact that that the information above on your After Visit Summary has been reviewed and is understood.  Full responsibility of the confidentiality of this discharge information lies with you and/or your care-partner.I found and removed one tiny polyp.  All else ok.  I will let you know pathology results and when to have another routine colonoscopy by mail and/or My Chart.  I appreciate the opportunity to care for you. Iva Boop, MD, [facility]

## 2023-01-19 NOTE — Progress Notes (Signed)
0845 BP 147/91, Labetalol given IV, MD update, vss

## 2023-01-19 NOTE — Progress Notes (Signed)
Report given to PACU, vss 

## 2023-01-20 ENCOUNTER — Telehealth: Payer: Self-pay | Admitting: *Deleted

## 2023-01-20 NOTE — Telephone Encounter (Signed)
  Follow up Call-     01/19/2023    7:41 AM  Call back number  Post procedure Call Back phone  # [phone]  Permission to leave phone message Yes     Patient questions:  Do you have a fever, pain , or abdominal swelling? No. Pain Score  0 *  Have you tolerated food without any problems? Yes.    Have you been able to return to your normal activities? Yes.    Do you have any questions about your discharge instructions: Diet   No. Medications  No. Follow up visit  No.  Do you have questions or concerns about your Care? No.  Actions: * If pain score is 4 or above: No action needed, pain <4.

## 2023-01-21 LAB — SURGICAL PATHOLOGY

## 2023-01-24 ENCOUNTER — Encounter: Payer: Self-pay | Admitting: Internal Medicine

## 2023-01-24 DIAGNOSIS — Z860101 Personal history of adenomatous and serrated colon polyps: Secondary | ICD-10-CM | POA: Insufficient documentation
